# Patient Record
Sex: Male | Born: 1935 | Race: White | Hispanic: No | Marital: Married | Smoking: Former smoker
Health system: Southern US, Community
[De-identification: ages and names within clinical notes are randomized; demographics above are authoritative.]

## PROBLEM LIST (undated history)

## (undated) DIAGNOSIS — D696 Thrombocytopenia, unspecified: Secondary | ICD-10-CM

## (undated) DIAGNOSIS — I350 Nonrheumatic aortic (valve) stenosis: Secondary | ICD-10-CM

## (undated) DIAGNOSIS — E222 Syndrome of inappropriate secretion of antidiuretic hormone: Secondary | ICD-10-CM

## (undated) DIAGNOSIS — S065X9A Traumatic subdural hemorrhage with loss of consciousness of unspecified duration, initial encounter: Secondary | ICD-10-CM

## (undated) DIAGNOSIS — K219 Gastro-esophageal reflux disease without esophagitis: Secondary | ICD-10-CM

## (undated) DIAGNOSIS — E785 Hyperlipidemia, unspecified: Secondary | ICD-10-CM

## (undated) DIAGNOSIS — I10 Essential (primary) hypertension: Secondary | ICD-10-CM

## (undated) DIAGNOSIS — I5022 Chronic systolic (congestive) heart failure: Secondary | ICD-10-CM

## (undated) DIAGNOSIS — I482 Chronic atrial fibrillation, unspecified: Secondary | ICD-10-CM

## (undated) DIAGNOSIS — I272 Pulmonary hypertension, unspecified: Secondary | ICD-10-CM

## (undated) DIAGNOSIS — N400 Enlarged prostate without lower urinary tract symptoms: Secondary | ICD-10-CM

## (undated) DIAGNOSIS — C4490 Unspecified malignant neoplasm of skin, unspecified: Secondary | ICD-10-CM

## (undated) DIAGNOSIS — I2510 Atherosclerotic heart disease of native coronary artery without angina pectoris: Secondary | ICD-10-CM

## (undated) DIAGNOSIS — I639 Cerebral infarction, unspecified: Secondary | ICD-10-CM

## (undated) DIAGNOSIS — M1990 Unspecified osteoarthritis, unspecified site: Secondary | ICD-10-CM

## (undated) DIAGNOSIS — I341 Nonrheumatic mitral (valve) prolapse: Secondary | ICD-10-CM

## (undated) DIAGNOSIS — R3129 Other microscopic hematuria: Secondary | ICD-10-CM

## (undated) DIAGNOSIS — G4733 Obstructive sleep apnea (adult) (pediatric): Secondary | ICD-10-CM

## (undated) HISTORY — DX: Hyperlipidemia, unspecified: E78.5

## (undated) HISTORY — DX: Other microscopic hematuria: R31.29

## (undated) HISTORY — DX: Atherosclerotic heart disease of native coronary artery without angina pectoris: I25.10

## (undated) HISTORY — DX: Benign prostatic hyperplasia without lower urinary tract symptoms: N40.0

## (undated) HISTORY — DX: Thrombocytopenia, unspecified: D69.6

## (undated) HISTORY — DX: Pulmonary hypertension, unspecified: I27.20

## (undated) HISTORY — DX: Obstructive sleep apnea (adult) (pediatric): G47.33

## (undated) HISTORY — PX: SKIN CANCER EXCISION: SHX779

## (undated) HISTORY — DX: Chronic systolic (congestive) heart failure: I50.22

## (undated) HISTORY — DX: Unspecified osteoarthritis, unspecified site: M19.90

## (undated) HISTORY — DX: Nonrheumatic aortic (valve) stenosis: I35.0

## (undated) HISTORY — DX: Nonrheumatic mitral (valve) prolapse: I34.1

## (undated) HISTORY — PX: BACK SURGERY: SHX140

## (undated) HISTORY — DX: Syndrome of inappropriate secretion of antidiuretic hormone: E22.2

---
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
## 1957-11-11 HISTORY — PX: LUMBAR DISC SURGERY: SHX700

## 2005-05-02 ENCOUNTER — Ambulatory Visit: Payer: Self-pay | Admitting: Critical Care Medicine

## 2005-05-08 ENCOUNTER — Ambulatory Visit: Payer: Self-pay | Admitting: Critical Care Medicine

## 2005-05-09 ENCOUNTER — Ambulatory Visit (HOSPITAL_COMMUNITY): Admission: RE | Admit: 2005-05-09 | Discharge: 2005-05-09 | Payer: Self-pay | Admitting: Cardiology

## 2005-07-08 ENCOUNTER — Ambulatory Visit: Payer: Self-pay | Admitting: Critical Care Medicine

## 2005-07-09 ENCOUNTER — Ambulatory Visit: Payer: Self-pay | Admitting: Pulmonary Disease

## 2005-10-08 ENCOUNTER — Inpatient Hospital Stay (HOSPITAL_COMMUNITY): Admission: AD | Admit: 2005-10-08 | Discharge: 2005-10-10 | Payer: Self-pay | Admitting: Cardiology

## 2005-11-22 ENCOUNTER — Ambulatory Visit (HOSPITAL_COMMUNITY): Admission: RE | Admit: 2005-11-22 | Discharge: 2005-11-22 | Payer: Self-pay | Admitting: Cardiology

## 2006-02-24 ENCOUNTER — Emergency Department (HOSPITAL_COMMUNITY): Admission: EM | Admit: 2006-02-24 | Discharge: 2006-02-24 | Payer: Self-pay | Admitting: Emergency Medicine

## 2013-09-16 ENCOUNTER — Encounter: Payer: Self-pay | Admitting: Cardiology

## 2013-10-04 ENCOUNTER — Emergency Department (HOSPITAL_COMMUNITY)
Admission: EM | Admit: 2013-10-04 | Discharge: 2013-10-04 | Disposition: A | Discharge status: Home / Self Care | Payer: Medicare Other | Source: Home / Self Care | Attending: Family Medicine | Admitting: Family Medicine

## 2013-10-04 ENCOUNTER — Encounter (HOSPITAL_COMMUNITY): Payer: Self-pay | Admitting: Emergency Medicine

## 2013-10-04 DIAGNOSIS — IMO0001 Reserved for inherently not codable concepts without codable children: Secondary | ICD-10-CM

## 2013-10-04 DIAGNOSIS — L03019 Cellulitis of unspecified finger: Secondary | ICD-10-CM

## 2013-10-04 HISTORY — DX: Chronic atrial fibrillation, unspecified: I48.20

## 2013-10-04 HISTORY — DX: Essential (primary) hypertension: I10

## 2013-10-04 MED ORDER — DOXYCYCLINE HYCLATE 100 MG PO CAPS: 100.0000 mg | ORAL_CAPSULE | Freq: Two times a day (BID) | ORAL | Status: DC

## 2013-10-04 NOTE — ED Provider Notes (Signed)
CSN: [id number]     Arrival date & time 10/04/13  1242 History   First MD Initiated Contact with Patient 10/04/13 1445     Chief Complaint  Patient presents with  . Cellulitis   (Consider location/radiation/quality/duration/timing/severity/associated sxs/prior Treatment) HPI Comments: Pt with swelling, tenderness proximal to R index fingernail for 3 days, swelling and redness spreading up dorsal finger.   Patient is a 79 y.o. male presenting with abscess. The history is provided by the patient.  Abscess Location:  Hand Hand abscess location:  R fingers Abscess quality: fluctuance, painful and redness   Abscess quality: not draining   Red streaking: no   Duration:  3 days Progression:  Worsening Pain details:    Quality:  Pressure and throbbing   Severity:  Severe   Duration:  3 days   Timing:  Constant   Progression:  Worsening Chronicity:  New Context: not diabetes   Relieved by:  None tried Ineffective treatments:  Topical antibiotics Associated symptoms: no fever     Past Medical History  Diagnosis Date  . Atrial fibrillation, chronic   . Hypertension    History reviewed. No pertinent past surgical history. History reviewed. No pertinent family history. History  Substance Use Topics  . Smoking status: Never Smoker   . Smokeless tobacco: Not on file  . Alcohol Use: No    Review of Systems  Constitutional: Negative for fever and chills.  Musculoskeletal:       Swelling R index finger  Skin: Positive for color change.    Allergies  Review of patient's allergies indicates no known allergies.  Home Medications   Current Outpatient Rx  Name  Route  Sig  Dispense  Refill  . benazepril (LOTENSIN) 10 MG tablet   Oral   Take 10 mg by mouth daily.         Marland Kitchen diltiazem (CARDIZEM) 60 MG tablet   Oral   Take 60 mg by mouth 4 (four) times daily.         . simvastatin (ZOCOR) 10 MG tablet   Oral   Take 10 mg by mouth daily.         Marland Kitchen warfarin  (COUMADIN) 1 MG tablet   Oral   Take 1 mg by mouth daily.         Marland Kitchen doxycycline (VIBRAMYCIN) 100 MG capsule   Oral   Take 1 capsule (100 mg total) by mouth 2 (two) times daily.   20 capsule   0    BP 126/80  Pulse 100  Temp(Src) 98.1 F (36.7 C) (Oral)  Resp 16  SpO2 97% Physical Exam  Constitutional: He appears well-developed and well-nourished. No distress.  Musculoskeletal:       Right hand: He exhibits swelling.       Hands: R finger with erythema to dorsal side, edema of entire finger (ROM impaired by edema).   Skin: Skin is warm and dry.  See msk exam    ED Course  INCISION AND DRAINAGE Date/Time: 10/04/2013 2:25 PM Performed by: Cathlyn Parsons Authorized by: Cathlyn Parsons Consent: Verbal consent obtained. Consent given by: patient Patient understanding: patient states understanding of the procedure being performed Patient identity confirmed: verbally with patient Type: abscess Body area: upper extremity Location details: right index finger Local anesthetic: topical anesthetic Patient sedated: no Scalpel size: 11 Incision type: single straight Complexity: simple Drainage: purulent and bloody Drainage amount: scant Wound treatment: wound left open Packing material: none Comments: Pt on coumadin,  bled for a bit after incision, bleeding stopped with direct pressure. Some blood noted collected under skin   (including critical care time) Labs Review Labs Reviewed  CULTURE, ROUTINE-ABSCESS   Imaging Review No results found.  EKG Interpretation    Date/Time:    Ventricular Rate:    PR Interval:    QRS Duration:   QT Interval:    QTC Calculation:   R Axis:     Text Interpretation:              MDM   1. Paronychia of second finger, right   rx doxycycline 100mg  BID #20. Pt to f/u with pcp in 2 days to have infection checked, and to discuss with pcp when to have INR checked (because of doxy use).      Cathlyn Parsons, NP 10/04/13  1558

## 2013-10-04 NOTE — ED Notes (Signed)
Call back number for lab issues verified at release  

## 2013-10-04 NOTE — ED Provider Notes (Signed)
Medical screening examination/treatment/procedure(s) were performed by a resident physician or non-physician practitioner and as the supervising physician I was immediately available for consultation/collaboration.  Polk Minor, MD      Jacoba Cherney S Zevin Nevares, MD 10/04/13 2114 

## 2013-10-04 NOTE — ED Notes (Signed)
Pain, swelling right indx finger x 3 days w red streak into  Hand, decreased ROM

## 2013-10-07 LAB — CULTURE, ROUTINE-ABSCESS

## 2013-10-13 NOTE — ED Notes (Signed)
Abscess culture R index finger: Few Staph Aureus.  Pt. adequately treated with I and D and Doxycycline. Vassie Moselle 10/13/2013

## 2013-11-08 ENCOUNTER — Encounter: Payer: Self-pay | Admitting: General Surgery

## 2013-11-08 DIAGNOSIS — I4821 Permanent atrial fibrillation: Secondary | ICD-10-CM | POA: Insufficient documentation

## 2013-11-08 DIAGNOSIS — I119 Hypertensive heart disease without heart failure: Secondary | ICD-10-CM

## 2013-11-08 DIAGNOSIS — I340 Nonrheumatic mitral (valve) insufficiency: Secondary | ICD-10-CM

## 2013-11-08 DIAGNOSIS — I4891 Unspecified atrial fibrillation: Secondary | ICD-10-CM

## 2013-11-08 DIAGNOSIS — I10 Essential (primary) hypertension: Secondary | ICD-10-CM | POA: Insufficient documentation

## 2013-11-22 ENCOUNTER — Ambulatory Visit: Payer: Self-pay | Admitting: Cardiology

## 2013-11-23 ENCOUNTER — Encounter (INDEPENDENT_AMBULATORY_CARE_PROVIDER_SITE_OTHER): Payer: Self-pay

## 2013-11-23 ENCOUNTER — Ambulatory Visit (INDEPENDENT_AMBULATORY_CARE_PROVIDER_SITE_OTHER): Payer: Medicare Other | Admitting: Cardiology

## 2013-11-23 ENCOUNTER — Other Ambulatory Visit (HOSPITAL_COMMUNITY): Payer: Self-pay | Admitting: Cardiology

## 2013-11-23 ENCOUNTER — Encounter: Payer: Self-pay | Admitting: Cardiology

## 2013-11-23 ENCOUNTER — Encounter: Payer: Self-pay | Admitting: Cardiovascular Disease

## 2013-11-23 ENCOUNTER — Other Ambulatory Visit: Payer: Self-pay | Admitting: General Surgery

## 2013-11-23 ENCOUNTER — Ambulatory Visit (HOSPITAL_COMMUNITY): Payer: Medicare Other | Attending: Cardiovascular Disease | Admitting: Cardiology

## 2013-11-23 VITALS — BP 140/90 | HR 87 | Ht 70.0 in | Wt 200.8 lb

## 2013-11-23 DIAGNOSIS — I10 Essential (primary) hypertension: Secondary | ICD-10-CM

## 2013-11-23 DIAGNOSIS — I059 Rheumatic mitral valve disease, unspecified: Secondary | ICD-10-CM

## 2013-11-23 DIAGNOSIS — I340 Nonrheumatic mitral (valve) insufficiency: Secondary | ICD-10-CM

## 2013-11-23 DIAGNOSIS — I341 Nonrheumatic mitral (valve) prolapse: Secondary | ICD-10-CM | POA: Insufficient documentation

## 2013-11-23 DIAGNOSIS — I4891 Unspecified atrial fibrillation: Secondary | ICD-10-CM | POA: Insufficient documentation

## 2013-11-23 DIAGNOSIS — I079 Rheumatic tricuspid valve disease, unspecified: Secondary | ICD-10-CM | POA: Insufficient documentation

## 2013-11-23 DIAGNOSIS — I482 Chronic atrial fibrillation, unspecified: Secondary | ICD-10-CM

## 2013-11-23 DIAGNOSIS — E785 Hyperlipidemia, unspecified: Secondary | ICD-10-CM | POA: Insufficient documentation

## 2013-11-23 NOTE — Progress Notes (Signed)
[name], [name]  [address] Phone: [phone] Fax:  [phone]  Date:  11/23/2013   ID:  Ryan Savage, DOB 02-07-1929, MRN [id number]  PCP:  Mathews Argyle, MD  Cardiologist:  Fransico Him, MD     History of Present Illness: Ryan Savage is a 80 y.o. 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  He is doing well.  He denies any chest pain, SOB, DOE, LE edema, dizziness, palpitations or syncope.   Wt Readings from Last 3 Encounters:  11/08/13 204 lb 6.4 oz (92.715 kg)     Past Medical History  Diagnosis Date  . Hypertension   . Atrial fibrillation, chronic     Chronic..coumadin clinic -Dr Radford Pax  . BPH (benign prostatic hypertrophy)   . [name] (degenerative joint disease)   . Hyperlipidemia     LDL goal less then 100  . OSA (obstructive sleep apnea)     intolerant with CPAP  . Microscopic hematuria     [name]  . Thrombocytopenia     Mild- platelet count 143,000 on 02/2011, stable 08/2011  . Mitral valve prolapse     with moderate MR  . SIADH (syndrome of inappropriate ADH production)     Current Outpatient Prescriptions  Medication Sig Dispense Refill  . benazepril (LOTENSIN) 10 MG tablet Take 40 mg by mouth daily.       Marland Kitchen diltiazem (CARDIZEM CD) 240 MG 24 hr capsule       . finasteride (PROSCAR) 5 MG tablet Take 5 mg by mouth daily.       Marland Kitchen loratadine (CLARITIN REDITABS) 10 MG dissolvable tablet Take 10 mg by mouth daily.      . simvastatin (ZOCOR) 10 MG tablet Take 10 mg by mouth daily.      Marland Kitchen warfarin (COUMADIN) 1 MG tablet Take 1 mg by mouth daily.       No current facility-administered medications for this visit.    Allergies:    Allergies  Allergen Reactions  . Novocain [Procaine]     Social History:  The patient  reports that he has never smoked. He does not have any smokeless tobacco history on file. He reports that  he does not drink alcohol or use illicit drugs.   Family History:  The patient's family history is not on file.   ROS:  Please see the history of present illness.      All other systems reviewed and negative.   PHYSICAL EXAM: VS:  There were no vitals taken for this visit. Well nourished, well developed, in no acute distress HEENT: normal Neck: no JVD Cardiac:  normal S1, S2; RRR; no murmur Lungs:  clear to auscultation bilaterally, no wheezing, rhonchi or rales Abd: soft, nontender, no hepatomegaly Ext: no edema Skin: warm and dry Neuro:  CNs 2-12 intact, no focal abnormalities noted  EKG:     Chronic atrial fibrillation rate controlled at 87bpm with nonspecific ST/T wave abnormality  ASSESSMENT AND PLAN:  1. Chronic atrial fibrillation - rate controlled  - continue Diltiazem CD/Warfarin 2. Chronic anticoagulation 3. HTN - mildly elevated diastolic BP  - His BP readings he brought from home showed 138/75-150/68mmHg.  - continue Diltiazem CD/Benazepril 4. MVP of the posterior MV leaflet and moderate MR  - He had an echo done today and results are pending  Followup with me in 1 year  Signed, Fransico Him,  MD 11/23/2013 8:54 AM

## 2013-11-23 NOTE — Progress Notes (Signed)
Echo performed. 

## 2013-11-23 NOTE — Patient Instructions (Signed)
Your physician recommends that you continue on your current medications as directed. Please refer to the Current Medication list given to you today.  Your physician wants you to follow-up in: 12 Months with Dr Mallie Snooks will receive a reminder letter in the mail two months in advance. If you don't receive a letter, please call our office to schedule the follow-up appointment.

## 2013-11-24 ENCOUNTER — Telehealth: Payer: Self-pay | Admitting: General Surgery

## 2013-11-24 DIAGNOSIS — I341 Nonrheumatic mitral (valve) prolapse: Secondary | ICD-10-CM

## 2013-11-24 NOTE — Telephone Encounter (Signed)
Message copied by Lily Kocher on Wed Nov 24, 2013  1:37 PM ------      Message from: Fransico Him R      Created: Wed Nov 24, 2013  9:30 AM       Please let patient know that echo showed normal LVF with Ef 65-70% with mildly thickened heart muscle, posterior MVP with moderate MR and moderately enlarged LA/RA with no pulmonary HTN - repeat echo in 1 year ------

## 2013-11-25 ENCOUNTER — Ambulatory Visit: Payer: Self-pay | Admitting: Cardiology

## 2014-01-07 ENCOUNTER — Other Ambulatory Visit: Payer: Self-pay

## 2014-01-07 MED ORDER — BENAZEPRIL HCL 10 MG PO TABS: 40.0000 mg | ORAL_TABLET | Freq: Every day | ORAL | Status: DC

## 2014-01-10 ENCOUNTER — Other Ambulatory Visit: Payer: Self-pay | Admitting: *Deleted

## 2014-01-10 MED ORDER — BENAZEPRIL HCL 40 MG PO TABS: 40.0000 mg | ORAL_TABLET | Freq: Every day | ORAL | Status: DC

## 2014-01-10 NOTE — Telephone Encounter (Signed)
Patients wife stated that the patient needs benazepril 40mg  tablet take 1qd. I will send in a correct rx.

## 2014-07-20 ENCOUNTER — Encounter: Payer: Self-pay | Admitting: *Deleted

## 2014-12-06 ENCOUNTER — Encounter: Payer: Self-pay | Admitting: Cardiology

## 2014-12-06 ENCOUNTER — Ambulatory Visit (INDEPENDENT_AMBULATORY_CARE_PROVIDER_SITE_OTHER): Payer: Medicare Other | Admitting: Cardiology

## 2014-12-06 VITALS — BP 134/74 | HR 96 | Ht 70.0 in | Wt 198.0 lb

## 2014-12-06 DIAGNOSIS — I482 Chronic atrial fibrillation, unspecified: Secondary | ICD-10-CM

## 2014-12-06 DIAGNOSIS — I10 Essential (primary) hypertension: Secondary | ICD-10-CM

## 2014-12-06 DIAGNOSIS — I340 Nonrheumatic mitral (valve) insufficiency: Secondary | ICD-10-CM

## 2014-12-06 DIAGNOSIS — I341 Nonrheumatic mitral (valve) prolapse: Secondary | ICD-10-CM

## 2014-12-06 NOTE — Patient Instructions (Signed)
Your physician has requested that you have an echocardiogram. Echocardiography is a painless test that uses sound waves to create images of your heart. It provides your doctor with information about the size and shape of your heart and how well your heart's chambers and valves are working. This procedure takes approximately one hour. There are no restrictions for this procedure.  Your physician has recommended that you wear a 24-hour holter monitor. Holter monitors are medical devices that record the heart's electrical activity. Doctors most often use these monitors to diagnose arrhythmias. Arrhythmias are problems with the speed or rhythm of the heartbeat. The monitor is a small, portable device. You can wear one while you do your normal daily activities. This is usually used to diagnose what is causing palpitations/syncope (passing out).  Your physician recommends that you continue on your current medications as directed. Please refer to the Current Medication list given to you today.  Your physician wants you to follow-up in: 1 year with [name]. You will receive a reminder letter in the mail two months in advance. If you don't receive a letter, please call our office to schedule the follow-up appointment.

## 2014-12-06 NOTE — Addendum Note (Signed)
Addended by: Fransico Him R on: 12/06/2014 02:32 PM   Modules accepted: Miquel Dunn

## 2014-12-06 NOTE — Progress Notes (Signed)
Cardiology Office Note   Date:  12/06/2014   ID:  Ryan Savage, DOB November 07, 1929, MRN [id number]  PCP:  Mathews Argyle, MD  Cardiologist:   Sueanne Margarita, MD   Chief Complaint  Patient presents with  . Mitral Valve Prolapse  . Mitral Regurgitation  . Hypertension  . Atrial Fibrillation      History of Present Illness: Ryan Savage is a 81 y.o. 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 He is doing well. He denies any chest pain, SOB, DOE, LE edema, dizziness, palpitations or syncope    Past Medical History  Diagnosis Date  . Hypertension   . Atrial fibrillation, chronic     Chronic..coumadin clinic -Dr Radford Pax  . BPH (benign prostatic hypertrophy)   . [name] (degenerative joint disease)   . Hyperlipidemia     LDL goal less then 100  . OSA (obstructive sleep apnea)     intolerant with CPAP  . Microscopic hematuria     [name]  . Thrombocytopenia     Mild- platelet count 143,000 on 02/2011, stable 08/2011  . Mitral valve prolapse     with moderate MR  . SIADH (syndrome of inappropriate ADH production)     Past Surgical History  Procedure Laterality Date  . Appendectomy  1950  . Ruptured disc repair  1959     Current Outpatient Prescriptions  Medication Sig Dispense Refill  . benazepril (LOTENSIN) 40 MG tablet Take 1 tablet (40 mg total) by mouth daily. 90 tablet 3  . diltiazem (CARDIZEM CD) 240 MG 24 hr capsule Take 240 mg by mouth daily.     . finasteride (PROSCAR) 5 MG tablet Take 5 mg by mouth daily.     Marland Kitchen loratadine (CLARITIN REDITABS) 10 MG dissolvable tablet Take 10 mg by mouth daily.    . simvastatin (ZOCOR) 10 MG tablet Take 10 mg by mouth daily.    Marland Kitchen warfarin (COUMADIN) 1 MG tablet Take 1 mg by mouth daily.     No current facility-administered medications for this visit.    Allergies:   Novocain    Social History:  The patient   reports that he has never smoked. He does not have any smokeless tobacco history on file. He reports that he does not drink alcohol or use illicit drugs.   Family History:  The patient's family history includes [name] in his brother and father; Colon cancer in his brother and sister; Heart attack in his brother and father; Heart failure in his mother.    ROS:  Please see the history of present illness.   Otherwise, review of systems are positive for none.   All other systems are reviewed and negative.    PHYSICAL EXAM: VS:  BP 134/74 mmHg  Pulse 96  Ht 5\' 10"  (1.778 m)  Wt 198 lb (89.812 kg)  BMI 28.41 kg/m2 , BMI Body mass index is 28.41 kg/(m^2). GEN: Well nourished, well developed, in no acute distress HEENT: normal Neck: no JVD, carotid bruits, or masses Cardiac: irregularly irregular; no rubs, or gallops,no edema 2/6 mid to late systolic murmur at apex, trace LE edema Respiratory:  clear to auscultation bilaterally, normal work of breathing GI: soft, nontender, nondistended, + BS MS: no deformity or atrophy Skin: warm and dry, no rash Neuro:  Strength and sensation are intact Psych: euthymic mood, full affect   EKG:  EKG is  ordered today and showed  atrial fibrillation at 96bpm with no ST changes    Recent Labs: No results found for requested labs within last 365 days.    Lipid Panel No results found for: CHOL, TRIG, HDL, CHOLHDL, VLDL, LDLCALC, LDLDIRECT    Wt Readings from Last 3 Encounters:  12/06/14 198 lb (89.812 kg)  11/23/13 200 lb 12.8 oz (91.082 kg)  11/08/13 204 lb 6.4 oz (92.715 kg)      Other studies Reviewed: Additional studies/ records that were reviewed today include: 2D echo 11/2013 Review of the above records demonstrates: normal LVF with moderate MR  ASSESSMENT AND PLAN:  1. Chronic atrial fibrillation - rate fairly well controlled but on the high side of normal at rest  - I will get a 24 hour Holter monitor to assess average HR to see if we  need to adjust meds - continue Diltiazem CD/Warfarin 2. Chronic anticoagulation 3. HTN - controlled - continue Diltiazem CD/Benazepril 4. MVP of the posterior MV leaflet and moderate MR - Repeat echo   Current medicines are reviewed at length with the patient today.  The patient does not have concerns regarding medicines.  The following changes have been made:  no change  Labs/ tests ordered today include: 2D echo and 24 Hour Holter    Disposition:   FU with me in 1 year   Signed, Sueanne Margarita, MD  12/06/2014 9:38 AM    [facility] [place], [place], [place]  [address] Phone: [phone]; Fax: [phone]

## 2014-12-13 ENCOUNTER — Other Ambulatory Visit: Payer: Self-pay | Admitting: Cardiology

## 2014-12-14 ENCOUNTER — Telehealth: Payer: Self-pay

## 2014-12-14 ENCOUNTER — Encounter: Payer: Self-pay | Admitting: *Deleted

## 2014-12-14 ENCOUNTER — Ambulatory Visit (HOSPITAL_COMMUNITY): Payer: Medicare Other | Attending: Internal Medicine | Admitting: Cardiology

## 2014-12-14 ENCOUNTER — Encounter (INDEPENDENT_AMBULATORY_CARE_PROVIDER_SITE_OTHER): Payer: Medicare Other

## 2014-12-14 DIAGNOSIS — I482 Chronic atrial fibrillation, unspecified: Secondary | ICD-10-CM

## 2014-12-14 DIAGNOSIS — I341 Nonrheumatic mitral (valve) prolapse: Secondary | ICD-10-CM | POA: Insufficient documentation

## 2014-12-14 DIAGNOSIS — I340 Nonrheumatic mitral (valve) insufficiency: Secondary | ICD-10-CM | POA: Insufficient documentation

## 2014-12-14 NOTE — Progress Notes (Signed)
Patient ID: Ryan Savage, male   DOB: Jun 04, 1929, 81 y.o.   MRN: [id number] [facility] 24 hour holter monitor applied to patient.

## 2014-12-14 NOTE — Telephone Encounter (Signed)
BP good and HR controlled - no change in meds

## 2014-12-14 NOTE — Progress Notes (Signed)
Echo performed. 

## 2014-12-14 NOTE — Telephone Encounter (Addendum)
Patient in the office to have holter monitor placed.   He dropped off VS log for Dr. Theodosia Blender review: BP 126/76 HR 79 BP 134/78 HR 84 (no AF) BP 136/80 HR 84 (AF) BP 125/76 HR 91 (AF in the morning) BP 132/73 HR 87 (no AF) BP 126/75 HR 90 (AF in the morning) BP 126/73 HR 79 (AF) BP 129/81 HR 68 (no AF) BP 129/66 HR 83 (AF) BP 132/84 HR 85 (no AF) BP 133/76 HR 92 (no AF) BP 135/75 HR 89 (AF) BP 125/77 HR 77 (no AF) BP 126/82 HR 72 (no AF)  Readings taken from Jan. 30 to Feb. 2. No times provided.   To [name] for review.

## 2014-12-15 NOTE — Telephone Encounter (Signed)
Follow Up ° ° ° ° ° ° ° °Pt returning phone call °

## 2014-12-15 NOTE — Telephone Encounter (Signed)
F/u ° ° °Pt returning your call °

## 2014-12-15 NOTE — Telephone Encounter (Signed)
Instructed patient to continue current treatment regimen due to good blood pressure readings. Patient informed of ECHO results and verbal understanding expressed.  Repeat ECHO ordered to be scheduled in one year. Patient agrees with treatment plan.

## 2014-12-15 NOTE — Telephone Encounter (Signed)
After patient called twice and walked into office today, left message for patient that he would be called this afternoon after clinic.

## 2014-12-15 NOTE — Telephone Encounter (Signed)
Follow Up       Pt returning phone call from [facility] Please call back and advise.

## 2014-12-15 NOTE — Telephone Encounter (Signed)
-----   Message from Sueanne Margarita, MD sent at 12/14/2014  1:49 PM EST ----- Please let patient know that echo showed normal LVF with MVP of the posterior mitral valve leaflet and mild to moderate MR. REpeat echo in 1 year for MR

## 2014-12-16 NOTE — Telephone Encounter (Signed)
This encounter was created in error - please disregard.

## 2014-12-22 ENCOUNTER — Telehealth: Payer: Self-pay | Admitting: Cardiology

## 2014-12-22 NOTE — Telephone Encounter (Signed)
Please let patient know that heart monitor showed atrial fibrillation with average HR of 81bpm.  There were occasional multifocal PVC's and

## 2014-12-27 NOTE — Telephone Encounter (Signed)
Patient informed of monitor results and verbal understanding expressed.   

## 2015-05-29 ENCOUNTER — Other Ambulatory Visit: Payer: Self-pay | Admitting: Cardiology

## 2015-06-05 ENCOUNTER — Other Ambulatory Visit: Payer: Self-pay | Admitting: Cardiology

## 2015-08-30 ENCOUNTER — Other Ambulatory Visit: Payer: Self-pay | Admitting: Cardiology

## 2016-01-04 ENCOUNTER — Other Ambulatory Visit: Payer: Self-pay | Admitting: Cardiology

## 2016-01-04 ENCOUNTER — Other Ambulatory Visit: Payer: Self-pay | Admitting: *Deleted

## 2016-01-04 MED ORDER — BENAZEPRIL HCL 40 MG PO TABS: 40.0000 mg | ORAL_TABLET | Freq: Every day | ORAL | Status: DC

## 2016-03-25 ENCOUNTER — Other Ambulatory Visit: Payer: Self-pay | Admitting: *Deleted

## 2016-03-25 MED ORDER — BENAZEPRIL HCL 40 MG PO TABS
40.0000 mg | ORAL_TABLET | Freq: Every day | ORAL | Status: DC
Start: 2016-03-25 — End: 2016-09-15

## 2016-06-13 ENCOUNTER — Other Ambulatory Visit: Payer: Self-pay | Admitting: Cardiology

## 2016-09-15 ENCOUNTER — Other Ambulatory Visit: Payer: Self-pay | Admitting: Cardiology

## 2016-10-09 ENCOUNTER — Ambulatory Visit: Payer: Medicare Other | Admitting: Cardiology

## 2016-10-17 ENCOUNTER — Ambulatory Visit (INDEPENDENT_AMBULATORY_CARE_PROVIDER_SITE_OTHER): Payer: Medicare Other | Admitting: Cardiology

## 2016-10-17 ENCOUNTER — Encounter (INDEPENDENT_AMBULATORY_CARE_PROVIDER_SITE_OTHER): Payer: Self-pay

## 2016-10-17 ENCOUNTER — Encounter: Payer: Self-pay | Admitting: Cardiology

## 2016-10-17 VITALS — BP 140/84 | HR 88 | Ht 70.0 in | Wt 195.1 lb

## 2016-10-17 DIAGNOSIS — I341 Nonrheumatic mitral (valve) prolapse: Secondary | ICD-10-CM

## 2016-10-17 DIAGNOSIS — I482 Chronic atrial fibrillation: Secondary | ICD-10-CM | POA: Diagnosis not present

## 2016-10-17 DIAGNOSIS — I10 Essential (primary) hypertension: Secondary | ICD-10-CM

## 2016-10-17 DIAGNOSIS — I4821 Permanent atrial fibrillation: Secondary | ICD-10-CM

## 2016-10-17 MED ORDER — DILTIAZEM HCL ER COATED BEADS 240 MG PO CP24: 240.0000 mg | ORAL_CAPSULE | Freq: Every day | ORAL | 3 refills | Status: DC

## 2016-10-17 NOTE — Progress Notes (Signed)
Cardiology Office Note    Date:  10/17/2016   ID:  Ryan Savage, DOB October 06, 1929, MRN [id number]  PCP:  Mathews Argyle, MD  Cardiologist:  Fransico Him, MD   Chief Complaint  Patient presents with  . Atrial Fibrillation  . Hypertension  . Mitral Regurgitation    History of Present Illness:  Ryan Savage is a 82 y.o. 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 He is doing well. He denies any chest pain, SOB, DOE, LE edema, dizziness, palpitations or syncope    Past Medical History:  Diagnosis Date  . Atrial fibrillation, chronic (HCC)    Chronic..coumadin clinic -Dr Radford Pax  . BPH (benign prostatic hypertrophy)   . [name] (degenerative joint disease)   . Hyperlipidemia    LDL goal less then 100  . Hypertension   . Microscopic hematuria    [name]  . Mitral valve prolapse    with moderate MR  . OSA (obstructive sleep apnea)    intolerant with CPAP  . SIADH (syndrome of inappropriate ADH production) ([place])   . Thrombocytopenia (HCC)    Mild- platelet count 143,000 on 02/2011, stable 08/2011    Past Surgical History:  Procedure Laterality Date  . APPENDECTOMY  1950  . ruptured disc repair  1959    Current Medications: Outpatient Medications Prior to Visit  Medication Sig Dispense Refill  . benazepril (LOTENSIN) 40 MG tablet TAKE 1 TABLET BY MOUTH  DAILY 30 tablet 3  . diltiazem (CARDIZEM CD) 240 MG 24 hr capsule Take 240 mg by mouth daily.     . finasteride (PROSCAR) 5 MG tablet Take 5 mg by mouth daily.     . simvastatin (ZOCOR) 10 MG tablet Take 10 mg by mouth daily.    Marland Kitchen warfarin (COUMADIN) 1 MG tablet Take 1 mg by mouth daily.    Marland Kitchen loratadine (CLARITIN REDITABS) 10 MG dissolvable tablet Take 10 mg by mouth daily.     No facility-administered medications prior to visit.      Allergies:   Novocain [procaine] and Other   Social History   Social  History  . Marital status: Married    Spouse name: N/A  . Number of children: N/A  . Years of education: N/A   Social History Main Topics  . Smoking status: Never Smoker  . Smokeless tobacco: Never Used  . Alcohol use No  . Drug use: No  . Sexual activity: Not Asked   Other Topics Concern  . None   Social History Narrative  . None     Family History:  The patient's family history includes [name] in his brother and father; Colon cancer in his brother and sister; Heart attack in his brother and father; Heart failure in his mother.   ROS:   Please see the history of present illness.    ROS All other systems reviewed and are negative.  No flowsheet data found.     PHYSICAL EXAM:   VS:  BP 140/84   Pulse 88   Ht 5\' 10"  (1.778 m)   Wt 195 lb 1.9 oz (88.5 kg)   SpO2 98%   BMI 28.00 kg/m    GEN: Well nourished, well developed, in no acute distress  HEENT: normal  Neck: no JVD, carotid bruits, or masses Cardiac: RRR; no rubs, or gallops,no edema.  Intact distal pulses bilaterally. 2/6 late mid to late systolic murmur at apex  with mid systolic click Respiratory:  clear to auscultation bilaterally, normal work of breathing GI: soft, nontender, nondistended, + BS MS: no deformity or atrophy  Skin: warm and dry, no rash Neuro:  Alert and Oriented x 3, Strength and sensation are intact Psych: euthymic mood, full affect  Wt Readings from Last 3 Encounters:  10/17/16 195 lb 1.9 oz (88.5 kg)  12/06/14 198 lb (89.8 kg)  11/23/13 200 lb 12.8 oz (91.1 kg)      Studies/Labs Reviewed:   EKG:  EKG is ordered today.  The ekg ordered today demonstrates atrial fibrillation with HR 88bpm with no ST changes  Recent Labs: No results found for requested labs within last 8760 hours.   Lipid Panel No results found for: CHOL, TRIG, HDL, CHOLHDL, VLDL, LDLCALC, LDLDIRECT  Additional studies/ records that were reviewed today include:  none    ASSESSMENT:    1. Permanent atrial  fibrillation ([place])   2. Benign essential HTN   3. Mitral valve prolapse      PLAN:  In order of problems listed above:  1. Permanent atrial fibrillation rate controlled. He will continue on cardizem and warfarin.  2. HTN - BP controlled on current meds.  He will continue on ACE I and Cardizem.  3. MVP with mild to moderate MR by echo.  I will repeat echo to assess for progression of MR.    Medication Adjustments/Labs and Tests Ordered: Current medicines are reviewed at length with the patient today.  Concerns regarding medicines are outlined above.  Medication changes, Labs and Tests ordered today are listed in the Patient Instructions below.  There are no Patient Instructions on file for this visit.   Signed, Fransico Him, MD  10/17/2016 10:08 AM    [facility] [place], [place],   [address] Phone: [phone]; Fax: [phone]

## 2016-10-17 NOTE — Patient Instructions (Signed)

## 2016-11-08 ENCOUNTER — Ambulatory Visit (HOSPITAL_COMMUNITY): Payer: Medicare Other | Attending: Cardiovascular Disease

## 2016-11-08 ENCOUNTER — Other Ambulatory Visit: Payer: Self-pay

## 2016-11-08 ENCOUNTER — Encounter (INDEPENDENT_AMBULATORY_CARE_PROVIDER_SITE_OTHER): Payer: Self-pay

## 2016-11-08 DIAGNOSIS — I340 Nonrheumatic mitral (valve) insufficiency: Secondary | ICD-10-CM | POA: Diagnosis not present

## 2016-11-08 DIAGNOSIS — I341 Nonrheumatic mitral (valve) prolapse: Secondary | ICD-10-CM | POA: Diagnosis not present

## 2016-11-08 DIAGNOSIS — I071 Rheumatic tricuspid insufficiency: Secondary | ICD-10-CM | POA: Insufficient documentation

## 2016-11-21 ENCOUNTER — Encounter: Payer: Self-pay | Admitting: Cardiology

## 2016-11-21 ENCOUNTER — Ambulatory Visit (INDEPENDENT_AMBULATORY_CARE_PROVIDER_SITE_OTHER): Payer: Medicare Other | Admitting: Cardiology

## 2016-11-21 ENCOUNTER — Encounter (INDEPENDENT_AMBULATORY_CARE_PROVIDER_SITE_OTHER): Payer: Self-pay

## 2016-11-21 VITALS — BP 140/80 | HR 80 | Ht 70.0 in | Wt 201.0 lb

## 2016-11-21 DIAGNOSIS — I340 Nonrheumatic mitral (valve) insufficiency: Secondary | ICD-10-CM

## 2016-11-21 NOTE — Progress Notes (Signed)
11/21/2016 Ryan Savage   02/12/1929  [id number]  Primary Physician Mathews Argyle, MD Primary Cardiologist: [name]   Reason for Visit/CC: Mitral Regurgitation.   HPI:  [name] is a 83 y.o. male with a history of posterior MV leaflet prolapse with with moderate MR, HTN and chronic atrial fibrillation on chronic anticoagulation.  He was recently seen by [name] 10/17/16 for routien f/u. She ordered a repeat 2D echo to reassess for progression of his MR. Echo showed normal LVF with mild MVP and moderate MR, severe LAE and RAE and TV prolapse with mild to moderate TR and moderate pulmonary HTN - compared to echo a year ago there is now at least moderate MR and now he has moderate pulmonary HTN which is new. [name] has recommended a TEE to better assess. He now presents to clinic to discuss results and need for additional imaging studies.   Given his age of 73, he is hesitant regarding further w/u as he feels that he may not ever want surgery. He is not willing to make a decision today regarding scheduling a TEE at the moment. He wishes to further discus and consider this with his wife.   He is asymptomatic. He is very active despite his age. He is enrolled in the silver sne[employer] club and exercises regularly, w/o exertional CP or dyspnea. He denies any s/s of HF, including no orthopnea, PND or LEE. He is asymptomatic with his atrial fibrillation.    Current Meds  Medication Sig  . Acetaminophen (TYLENOL) 325 MG CAPS Take by mouth as directed.  . benazepril (LOTENSIN) 40 MG tablet TAKE 1 TABLET BY MOUTH  DAILY  . diltiazem (CARDIZEM CD) 240 MG 24 hr capsule Take 1 capsule (240 mg total) by mouth daily.  . finasteride (PROSCAR) 5 MG tablet Take 5 mg by mouth daily.   . Multiple Vitamin (MULTIVITAMIN) capsule Take by mouth.  . simvastatin (ZOCOR) 10 MG tablet Take 10 mg by mouth daily.  Marland Kitchen warfarin (COUMADIN) 1 MG tablet Take 1 mg by mouth daily.   Allergies    Allergen Reactions  . Novocain [Procaine]     UNKNOWN  . Other Rash    Novacaine   Past Medical History:  Diagnosis Date  . Atrial fibrillation, chronic (HCC)    Chronic..coumadin clinic -Dr Radford Pax  . BPH (benign prostatic hypertrophy)   . [name] (degenerative joint disease)   . Hyperlipidemia    LDL goal less then 100  . Hypertension   . Microscopic hematuria    [name]  . Mitral valve prolapse    with moderate MR  . OSA (obstructive sleep apnea)    intolerant with CPAP  . SIADH (syndrome of inappropriate ADH production) ([place])   . Thrombocytopenia (HCC)    Mild- platelet count 143,000 on 02/2011, stable 08/2011   Family History  Problem Relation Age of Onset  . Heart failure Mother   . Heart attack Father   . [name] Father   . Heart attack Brother   . [name] Brother   . Colon cancer Brother   . Colon cancer Sister    Past Surgical History:  Procedure Laterality Date  . APPENDECTOMY  1950  . ruptured disc repair  1959   Social History   Social History  . Marital status: Married    Spouse name: N/A  . Number of children: N/A  . Years of education: N/A   Occupational History  . Not on file.  Social History Main Topics  . Smoking status: Never Smoker  . Smokeless tobacco: Never Used  . Alcohol use No  . Drug use: No  . Sexual activity: Not on file   Other Topics Concern  . Not on file   Social History Narrative  . No narrative on file     Review of Systems: General: negative for chills, fever, night sweats or weight changes.  Cardiovascular: negative for chest pain, dyspnea on exertion, edema, orthopnea, palpitations, paroxysmal nocturnal dyspnea or shortness of breath Dermatological: negative for rash Respiratory: negative for cough or wheezing Urologic: negative for hematuria Abdominal: negative for nausea, vomiting, diarrhea, bright red blood per rectum, melena, or hematemesis Neurologic: negative for visual changes, syncope, or dizziness All  other systems reviewed and are otherwise negative except as noted above.   Physical Exam:  Blood pressure 140/80, pulse 80, height 5\' 10"  (1.778 m), weight 201 lb (91.2 kg).  General appearance: alert, cooperative and no distress Neck: no carotid bruit and no JVD Lungs: clear to auscultation bilaterally Heart: irregularly irregular rhythm and 2/6 murmur, loudest near apex Extremities: extremities normal, atraumatic, no cyanosis or edema Pulses: 2+ and symmetric Skin: Skin color, texture, turgor normal. No rashes or lesions Neurologic: Grossly normal  EKG not performed   ASSESSMENT AND PLAN:   1. Mitral Regurgitation: Recent Echo 11/08/16 showed normal LVF with mild MVP and moderate MR, severe LAE and RAE and TV prolapse with mild to moderate TR and moderate pulmonary HTN - compared to echo a year ago there is now at least moderate MR and now he has moderate pulmonary HTN which is new. [name] has recommended a TEE to better assess. I discussed with patient indication for procedure as well as potential risk. Given his age of 25, he is hesitant regarding further w/u as he feels that he may not ever want surgery. He is not willing to make a decision today regarding scheduling a TEE at the moment. He wishes to further discus and consider this with his wife. I informed patient of the risk of development of irreversible systolic HF if worsening mitral valve disease w/o surgical treatment. He was given patient education materials regarding the  procedure and states he will call later next week once he reaches a decision. He was informed that if he agrees, the procedure will need to be performed within 30 days of today's office visit.  I will notify [name]. At this point, he remains asymptomatic.  He is very active despite his age. He is enrolled in the [employer] sne[employer] club and exercises regularly, w/o exertional CP or dyspnea. He denies any s/s of HF, including no orthopnea, PND or LEE.   2.  Chronic Atrial Fibrillation: rate is controlled. He is on chronic anticoagulation. He is asymptomatic.     Lyda Jester PA-C 11/21/2016 5:47 PM

## 2016-11-21 NOTE — Patient Instructions (Addendum)
Medication Instructions:  Your physician recommends that you continue on your current medications as directed. Please refer to the Current Medication list given to you today.   Labwork: NONE  Testing/Procedures: NONE  Follow-Up: PLEASE CALL THE OFFICE [phone] ASAP TO LET us KNOW IF YOU WOULD LIKE TO PROCEED WITH THE TEE AS EXPLAINED TO YOU TODAY BY BRITTANY SIMMONS, PAC.   [name] WILL BE IN [place] ON 11/27/16 TO DO YOUR PROCEDURE IF YOU DECIDE TO GO THROUGH WITH THE TEE.   Any Other Special Instructions Will Be Listed Below (If Applicable).     If you need a refill on your cardiac medications before your next appointment, please call your pharmacy.            Mitral Valve Regurgitation Mitral valve regurgitation, also called mitral regurgitation, is a condition in which blood leaks from the mitral valve in the heart. The mitral valve is located between the upper left chamber (left atrium) and the lower left chamber (left ventricle) of the heart. Normally, this valve opens when the atrium pumps blood into the ventricle, and it closes when the ventricle pumps blood out to the body. Mitral valve regurgitation happens when the mitral valve does not close properly. As a result, blood in the ventricle leaks back into the atrium. Mitral valve regurgitation causes the heart to work harder to pump blood. If the condition is mild, a person may not have symptoms. However, over time, this can lead to heart failure. What are the causes? This condition may be caused by:  A condition in which the mitral valves do not close completely when the heart pumps blood (mitral valve prolapse).  Infection, such as endocarditis or rheumatic fever.  Damage to the mitral valve, such as from injury (trauma) to the heart, a problem present at birth (birth defect), or a heart attack.  Certain medicines. What increases the risk? This condition is more likely to develop in people who  have:  Certain forms of heart disease.  A family history of heart valve disease.  Certain conditions that are present at birth (congenital). You are also more likely to develop this condition if you have taken certain diet pills in the past. What are the signs or symptoms? Symptoms of this condition include:  Shortness of breath with physical activity, like climbing stairs.  Fast or irregular heartbeat.  Cough.  Suddenly waking up at night with difficulty breathing or needing to urinate.  Heavy breathing.  Extreme tiredness.  Swelling in the lower legs, ankles, and feet. In some cases of mild to moderate mitral regurgitation, there are no symptoms. How is this diagnosed? This condition may be diagnosed based on the results of a physical exam. Your health care provider will listen to your heart for an abnormal heart sound (murmur). You may also have other tests, including:  An echocardiogram. This test creates ultrasound images of the heart that allow your health care provider to see how the heart valves work while your heart is beating.  Chest X-ray.  Electrocardiogram (ECG). This is a test that records the electrical impulses of the heart.  Cardiac catheterization. This test is used to look at the structure and function of the heart. A thin tube (catheter) is passed through the blood vessels and into the heart. Dye is injected into the blood vessels so the cardiac system can be seen on images that are taken. How is this treated? This condition may be treated with:  Medicines. These may  be given to treat symptoms and prevent complications.  Surgery to repair or replace the mitral valve in severe, long-term (chronic) cases. Follow these instructions at home: Lifestyle  Limit alcohol intake to no more than 1 drink a day for nonpregnant women and 2 drinks a day for men. One drink equals 12 oz of beer, 5 oz of wine, or 1 oz of hard liquor.  Do not use any products that  contain nicotine or tobacco, such as cigarettes and e-cigarettes. If you need help quitting, ask your health care provider.  Eat a heart-healthy diet that includes plenty of fresh fruits and vegetables, whole grains, low-fat (lean) protein, and low-fat dairy products. Consider working with a diet and nutrition specialist (dietitian) to help you make healthy food choices.  Limit the amount of salt (sodium) in your diet. Avoid adding salt to foods, and avoid foods that are high in salt, such as:  Pickles.  Smoked and cured meats.  Processed foods.  Maintain a healthy weight and stay physically active. Ask your health care provider to recommend activities that are safe for you.  Try to get 7 or more hours of sleep each night.  Find ways to manage stress. If you need help with this, ask your health care provider. General instructions  Take over-the-counter and prescription medicines only as told by your health care provider.  Work closely with your health care provider to manage any other health conditions you have, such as diabetes or high blood pressure.  If you plan to become pregnant, talk with your health care provider first.  Keep all follow-up visits as told by your health care provider. This is important. Contact a health care provider if:  You have a fever.  You feel more tired than usual when doing physical activity.  You have a dry cough. Get help right away if:  You have shortness of breath.  You develop chest pain.  You have swelling in your hands, feet, ankles, or abdomen that is getting worse.  You have trouble staying awake or you faint.  You feel dizzy or unsteady.  You suddenly gain weight.  You feel confused.  Any of your symptoms begin to get worse. These symptoms may represent a serious problem that is an emergency. Do not wait to see if the symptoms will go away. Get medical help right away. Call your local emergency services (911 in the [place] Do  not drive yourself to the hospital.  Summary  Mitral valve regurgitation, also called mitral regurgitation, is a condition in which blood leaks from a valve between two chambers of the heart (mitral valve).  Depending on how severe your condition is, you may be treated with medicines or surgery.  Practice heart-healthy habits to manage this condition. These include limiting alcohol, avoiding nicotine and tobacco, and eating a balanced diet that is low in salt (sodium). This information is not intended to replace advice given to you by your health care provider. Make sure you discuss any questions you have with your health care provider. Document Released: 01/15/2005 Document Revised: 08/09/2016 Document Reviewed: 08/09/2016 Elsevier Interactive Patient Education  2017 [place]. Transesophageal Echocardiogram Transesophageal echocardiography (TEE) is a picture test of your heart using sound waves. The pictures taken can give very detailed pictures of your heart. This can help your doctor see if there are problems with your heart. TEE can check:  If your heart has blood clots in it.  How well your heart valves are working.  If  you have an infection on the inside of your heart.  Some of the major arteries of your heart.  If your heart valve is working after a Office [employer].  Your heart before a procedure that uses a shock to your heart to get the rhythm back to normal. What happens before the procedure?  Do not eat or drink for 6 hours before the procedure or as told by your doctor.  Make plans to have someone drive you home after the procedure. Do not drive yourself home.  An IV tube will be put in your arm. What happens during the procedure?  You will be given a medicine to help you relax (sedative). It will be given through the IV tube.  A numbing medicine will be sprayed or gargled in the back of your throat to help numb it.  The tip of the probe is placed into the back of your  mouth. You will be asked to swallow. This helps to pass the probe into your esophagus.  Once the tip of the probe is in the right place, your doctor can take pictures of your heart.  You may feel pressure at the back of your throat. What happens after the procedure?  You will be taken to a recovery area so the sedative can wear off.  Your throat may be sore and scratchy. This will go away slowly over time.  You will go home when you are fully awake and able to swallow liquids.  You should have someone stay with you for the next 24 hours.  Do not drive or operate machinery for the next 24 hours. This information is not intended to replace advice given to you by your health care provider. Make sure you discuss any questions you have with your health care provider. Document Released: 08/25/2009 Document Revised: 04/04/2016 Document Reviewed: 04/29/2013 Elsevier Interactive Patient Education  2017 [employer].

## 2016-11-25 ENCOUNTER — Telehealth: Payer: Self-pay | Admitting: Cardiology

## 2016-11-25 NOTE — Telephone Encounter (Signed)
Returned call to patient left message on personal voice mail I will send message to Dr.Turner and let her know you do not want to have TEE.

## 2016-11-25 NOTE — Telephone Encounter (Signed)
Please let patient know that he now has pulmonary HTN that is significant and likely due to his heart valve.  If this progresses it will become irreversible and lead to CHF

## 2016-11-25 NOTE — Telephone Encounter (Signed)
New message   Pt verbalized that he is calling to decline TEE

## 2016-12-02 NOTE — Telephone Encounter (Signed)
Left message to call back  

## 2016-12-04 ENCOUNTER — Encounter: Payer: Self-pay | Admitting: Cardiology

## 2016-12-04 NOTE — Telephone Encounter (Signed)
Returned patient's call. Left message to call back.

## 2016-12-04 NOTE — Telephone Encounter (Signed)
Patient is returning your call,thanks. °

## 2016-12-04 NOTE — Telephone Encounter (Signed)
This encounter was created in error - please disregard.

## 2016-12-10 NOTE — Telephone Encounter (Signed)
Reiterated to patient in detail that his PHTN is new and significant. He understands that this could be due to his heart valve, could become irreversible and lead to CHF. He is adamant about not having procedure done at this time. Instructed patient to call if he changes his mind or symptoms occur. He was grateful for call.

## 2017-01-14 ENCOUNTER — Other Ambulatory Visit: Payer: Self-pay | Admitting: Cardiology

## 2017-03-20 ENCOUNTER — Other Ambulatory Visit: Payer: Self-pay | Admitting: Cardiology

## 2017-08-29 ENCOUNTER — Other Ambulatory Visit: Payer: Self-pay | Admitting: Cardiology

## 2017-09-23 ENCOUNTER — Encounter: Payer: Self-pay | Admitting: Cardiology

## 2017-11-18 ENCOUNTER — Emergency Department (HOSPITAL_COMMUNITY): Payer: Medicare Other

## 2017-11-18 ENCOUNTER — Encounter (HOSPITAL_COMMUNITY): Payer: Self-pay | Admitting: Emergency Medicine

## 2017-11-18 ENCOUNTER — Inpatient Hospital Stay (HOSPITAL_COMMUNITY)
Admission: EM | Admit: 2017-11-18 | Discharge: 2017-11-27 | DRG: 286 | Disposition: A | Discharge status: Home Health | Payer: Medicare Other | Attending: Internal Medicine | Admitting: Internal Medicine

## 2017-11-18 ENCOUNTER — Ambulatory Visit: Payer: Medicare Other | Admitting: Cardiology

## 2017-11-18 ENCOUNTER — Other Ambulatory Visit: Payer: Self-pay

## 2017-11-18 DIAGNOSIS — E876 Hypokalemia: Secondary | ICD-10-CM | POA: Diagnosis present

## 2017-11-18 DIAGNOSIS — N400 Enlarged prostate without lower urinary tract symptoms: Secondary | ICD-10-CM | POA: Diagnosis present

## 2017-11-18 DIAGNOSIS — I10 Essential (primary) hypertension: Secondary | ICD-10-CM | POA: Diagnosis not present

## 2017-11-18 DIAGNOSIS — H9190 Unspecified hearing loss, unspecified ear: Secondary | ICD-10-CM | POA: Diagnosis present

## 2017-11-18 DIAGNOSIS — I5041 Acute combined systolic (congestive) and diastolic (congestive) heart failure: Secondary | ICD-10-CM | POA: Diagnosis not present

## 2017-11-18 DIAGNOSIS — E0590 Thyrotoxicosis, unspecified without thyrotoxic crisis or storm: Secondary | ICD-10-CM | POA: Diagnosis present

## 2017-11-18 DIAGNOSIS — I4821 Permanent atrial fibrillation: Secondary | ICD-10-CM | POA: Diagnosis present

## 2017-11-18 DIAGNOSIS — E119 Type 2 diabetes mellitus without complications: Secondary | ICD-10-CM | POA: Diagnosis not present

## 2017-11-18 DIAGNOSIS — Z7901 Long term (current) use of anticoagulants: Secondary | ICD-10-CM | POA: Diagnosis not present

## 2017-11-18 DIAGNOSIS — R739 Hyperglycemia, unspecified: Secondary | ICD-10-CM | POA: Diagnosis present

## 2017-11-18 DIAGNOSIS — I509 Heart failure, unspecified: Secondary | ICD-10-CM | POA: Diagnosis not present

## 2017-11-18 DIAGNOSIS — E222 Syndrome of inappropriate secretion of antidiuretic hormone: Secondary | ICD-10-CM | POA: Diagnosis not present

## 2017-11-18 DIAGNOSIS — I429 Cardiomyopathy, unspecified: Secondary | ICD-10-CM | POA: Diagnosis not present

## 2017-11-18 DIAGNOSIS — R001 Bradycardia, unspecified: Secondary | ICD-10-CM | POA: Diagnosis present

## 2017-11-18 DIAGNOSIS — E7849 Other hyperlipidemia: Secondary | ICD-10-CM | POA: Diagnosis not present

## 2017-11-18 DIAGNOSIS — Z8249 Family history of ischemic heart disease and other diseases of the circulatory system: Secondary | ICD-10-CM

## 2017-11-18 DIAGNOSIS — I482 Chronic atrial fibrillation, unspecified: Secondary | ICD-10-CM

## 2017-11-18 DIAGNOSIS — I5021 Acute systolic (congestive) heart failure: Secondary | ICD-10-CM | POA: Diagnosis present

## 2017-11-18 DIAGNOSIS — Z792 Long term (current) use of antibiotics: Secondary | ICD-10-CM | POA: Diagnosis not present

## 2017-11-18 DIAGNOSIS — G4733 Obstructive sleep apnea (adult) (pediatric): Secondary | ICD-10-CM | POA: Diagnosis present

## 2017-11-18 DIAGNOSIS — I2582 Chronic total occlusion of coronary artery: Secondary | ICD-10-CM | POA: Diagnosis present

## 2017-11-18 DIAGNOSIS — E669 Obesity, unspecified: Secondary | ICD-10-CM | POA: Diagnosis not present

## 2017-11-18 DIAGNOSIS — R791 Abnormal coagulation profile: Secondary | ICD-10-CM | POA: Diagnosis present

## 2017-11-18 DIAGNOSIS — I5043 Acute on chronic combined systolic (congestive) and diastolic (congestive) heart failure: Secondary | ICD-10-CM | POA: Diagnosis not present

## 2017-11-18 DIAGNOSIS — I340 Nonrheumatic mitral (valve) insufficiency: Secondary | ICD-10-CM | POA: Diagnosis present

## 2017-11-18 DIAGNOSIS — I25118 Atherosclerotic heart disease of native coronary artery with other forms of angina pectoris: Secondary | ICD-10-CM | POA: Diagnosis not present

## 2017-11-18 DIAGNOSIS — R0902 Hypoxemia: Secondary | ICD-10-CM | POA: Diagnosis present

## 2017-11-18 DIAGNOSIS — I2510 Atherosclerotic heart disease of native coronary artery without angina pectoris: Secondary | ICD-10-CM | POA: Diagnosis present

## 2017-11-18 DIAGNOSIS — I25110 Atherosclerotic heart disease of native coronary artery with unstable angina pectoris: Secondary | ICD-10-CM | POA: Diagnosis not present

## 2017-11-18 DIAGNOSIS — I255 Ischemic cardiomyopathy: Secondary | ICD-10-CM | POA: Diagnosis present

## 2017-11-18 DIAGNOSIS — Z79899 Other long term (current) drug therapy: Secondary | ICD-10-CM | POA: Diagnosis not present

## 2017-11-18 DIAGNOSIS — E871 Hypo-osmolality and hyponatremia: Secondary | ICD-10-CM | POA: Diagnosis present

## 2017-11-18 DIAGNOSIS — I341 Nonrheumatic mitral (valve) prolapse: Secondary | ICD-10-CM | POA: Diagnosis present

## 2017-11-18 DIAGNOSIS — I272 Pulmonary hypertension, unspecified: Secondary | ICD-10-CM | POA: Diagnosis present

## 2017-11-18 DIAGNOSIS — R946 Abnormal results of thyroid function studies: Secondary | ICD-10-CM | POA: Diagnosis not present

## 2017-11-18 DIAGNOSIS — I4891 Unspecified atrial fibrillation: Secondary | ICD-10-CM | POA: Diagnosis not present

## 2017-11-18 DIAGNOSIS — E878 Other disorders of electrolyte and fluid balance, not elsewhere classified: Secondary | ICD-10-CM | POA: Diagnosis present

## 2017-11-18 DIAGNOSIS — I110 Hypertensive heart disease with heart failure: Secondary | ICD-10-CM | POA: Diagnosis present

## 2017-11-18 DIAGNOSIS — E785 Hyperlipidemia, unspecified: Secondary | ICD-10-CM | POA: Diagnosis present

## 2017-11-18 DIAGNOSIS — I420 Dilated cardiomyopathy: Secondary | ICD-10-CM | POA: Diagnosis not present

## 2017-11-18 DIAGNOSIS — I5033 Acute on chronic diastolic (congestive) heart failure: Secondary | ICD-10-CM | POA: Diagnosis not present

## 2017-11-18 DIAGNOSIS — I5023 Acute on chronic systolic (congestive) heart failure: Secondary | ICD-10-CM | POA: Diagnosis not present

## 2017-11-18 DIAGNOSIS — Z888 Allergy status to other drugs, medicaments and biological substances status: Secondary | ICD-10-CM

## 2017-11-18 HISTORY — DX: Unspecified malignant neoplasm of skin, unspecified: C44.90

## 2017-11-18 LAB — BRAIN NATRIURETIC PEPTIDE: B Natriuretic Peptide: 1421.3 pg/mL — ABNORMAL HIGH (ref 0.0–100.0)

## 2017-11-18 LAB — BASIC METABOLIC PANEL
Anion gap: 12 (ref 5–15)
Anion gap: 16 — ABNORMAL HIGH (ref 5–15)
Anion gap: 13 (ref 5–15)
Anion gap: 12 (ref 5–15)
BUN: 20 mg/dL (ref 6–20)
BUN: 19 mg/dL (ref 6–20)
BUN: 19 mg/dL (ref 6–20)
BUN: 20 mg/dL (ref 6–20)
CO2: 20 mmol/L — ABNORMAL LOW (ref 22–32)
CO2: 18 mmol/L — ABNORMAL LOW (ref 22–32)
CO2: 20 mmol/L — ABNORMAL LOW (ref 22–32)
CO2: 21 mmol/L — ABNORMAL LOW (ref 22–32)
Calcium: 8.3 mg/dL — ABNORMAL LOW (ref 8.9–10.3)
Calcium: 8.3 mg/dL — ABNORMAL LOW (ref 8.9–10.3)
Calcium: 8.2 mg/dL — ABNORMAL LOW (ref 8.9–10.3)
Calcium: 8.4 mg/dL — ABNORMAL LOW (ref 8.9–10.3)
Chloride: 86 mmol/L — ABNORMAL LOW (ref 101–111)
Chloride: 84 mmol/L — ABNORMAL LOW (ref 101–111)
Chloride: 86 mmol/L — ABNORMAL LOW (ref 101–111)
Chloride: 86 mmol/L — ABNORMAL LOW (ref 101–111)
Creatinine, Ser: 0.97 mg/dL (ref 0.61–1.24)
Creatinine, Ser: 0.99 mg/dL (ref 0.61–1.24)
Creatinine, Ser: 0.99 mg/dL (ref 0.61–1.24)
Creatinine, Ser: 1.13 mg/dL (ref 0.61–1.24)
GFR calc Af Amer: >60 mL/min (ref >60)
GFR calc Af Amer: >60 mL/min (ref >60)
GFR calc Af Amer: >60 mL/min (ref >60)
GFR calc Af Amer: >60 mL/min (ref >60)
GFR calc non Af Amer: >60 mL/min (ref >60)
GFR calc non Af Amer: >60 mL/min (ref >60)
GFR calc non Af Amer: >60 mL/min (ref >60)
GFR calc non Af Amer: 57 mL/min — ABNORMAL LOW (ref >60)
Glucose, Bld: 145 mg/dL — ABNORMAL HIGH (ref 65–99)
Glucose, Bld: 176 mg/dL — ABNORMAL HIGH (ref 65–99)
Glucose, Bld: 201 mg/dL — ABNORMAL HIGH (ref 65–99)
Glucose, Bld: 184 mg/dL — ABNORMAL HIGH (ref 65–99)
Potassium: 4.7 mmol/L (ref 3.5–5.1)
Potassium: 4.3 mmol/L (ref 3.5–5.1)
Potassium: 4.5 mmol/L (ref 3.5–5.1)
Potassium: 4.5 mmol/L (ref 3.5–5.1)
Sodium: 118 mmol/L — CL (ref 135–145)
Sodium: 118 mmol/L — CL (ref 135–145)
Sodium: 119 mmol/L — CL (ref 135–145)
Sodium: 119 mmol/L — CL (ref 135–145)

## 2017-11-18 LAB — URINALYSIS, ROUTINE W REFLEX MICROSCOPIC
Bacteria, UA: NONE SEEN
Bilirubin Urine: NEGATIVE
Glucose, UA: NEGATIVE mg/dL
Ketones, ur: 5 mg/dL — AB
Leukocytes, UA: NEGATIVE
Nitrite: NEGATIVE
Protein, ur: NEGATIVE mg/dL
Specific Gravity, Urine: 1.008 (ref 1.005–1.030)
Squamous Epithelial / LPF: NONE SEEN
pH: 6 (ref 5.0–8.0)

## 2017-11-18 LAB — RESPIRATORY PANEL BY PCR

## 2017-11-18 LAB — CBC
HCT: 38.8 % — ABNORMAL LOW (ref 39.0–52.0)
Hemoglobin: 13.5 g/dL (ref 13.0–17.0)
MCH: 30.8 pg (ref 26.0–34.0)
MCHC: 34.8 g/dL (ref 30.0–36.0)
MCV: 88.6 fL (ref 78.0–100.0)
Platelets: 344 10*3/uL (ref 150–400)
RBC: 4.38 MIL/uL (ref 4.22–5.81)
RDW: 12.9 % (ref 11.5–15.5)
WBC: 14 10*3/uL — ABNORMAL HIGH (ref 4.0–10.5)

## 2017-11-18 LAB — OSMOLALITY, URINE: Osmolality, Ur: 343 mOsm/kg (ref 300–900)

## 2017-11-18 LAB — T4, FREE: Free T4: 1.91 ng/dL — ABNORMAL HIGH (ref 0.61–1.12)

## 2017-11-18 LAB — I-STAT TROPONIN, ED
Troponin i, poc: 0.08 ng/mL (ref 0.00–0.08)
Troponin i, poc: 0.09 ng/mL (ref 0.00–0.08)

## 2017-11-18 LAB — PROTIME-INR
INR: >10
INR: >10
Prothrombin Time: >90 seconds — ABNORMAL HIGH (ref 11.4–15.2)
Prothrombin Time: >90 seconds — ABNORMAL HIGH (ref 11.4–15.2)

## 2017-11-18 LAB — TROPONIN I: Troponin I: 0.09 ng/mL (ref <0.03)

## 2017-11-18 LAB — TSH: TSH: 0.574 u[IU]/mL (ref 0.350–4.500)

## 2017-11-18 MED ORDER — FINASTERIDE 5 MG PO TABS
5.0000 mg | ORAL_TABLET | Freq: Every day | ORAL | Status: DC
  Administered 2017-11-19 – 2017-11-27 (×9): 5 mg via ORAL
  Filled 2017-11-18 (×9): qty 1

## 2017-11-18 MED ORDER — DOCUSATE SODIUM 100 MG PO CAPS
200.0000 mg | ORAL_CAPSULE | Freq: Every day | ORAL | Status: DC
  Administered 2017-11-18 – 2017-11-23 (×6): 200 mg via ORAL
  Filled 2017-11-18 (×8): qty 2

## 2017-11-18 MED ORDER — DILTIAZEM LOAD VIA INFUSION
10.0000 mg | Freq: Once | INTRAVENOUS | Status: AC
  Administered 2017-11-18: 10 mg via INTRAVENOUS
  Filled 2017-11-18: qty 10

## 2017-11-18 MED ORDER — ONDANSETRON HCL 4 MG/2ML IJ SOLN: 4.0000 mg | Freq: Four times a day (QID) | INTRAMUSCULAR | Status: DC | PRN

## 2017-11-18 MED ORDER — MUSCLE RUB 10-15 % EX CREA
1.0000 "application " | TOPICAL_CREAM | CUTANEOUS | Status: DC | PRN
  Filled 2017-11-18: qty 85

## 2017-11-18 MED ORDER — ALUM & MAG HYDROXIDE-SIMETH 200-200-20 MG/5ML PO SUSP: 30.0000 mL | ORAL | Status: DC | PRN

## 2017-11-18 MED ORDER — SODIUM CHLORIDE 0.9% FLUSH: 3.0000 mL | INTRAVENOUS | Status: DC | PRN

## 2017-11-18 MED ORDER — FUROSEMIDE 10 MG/ML IJ SOLN
40.0000 mg | Freq: Once | INTRAMUSCULAR | Status: AC
  Administered 2017-11-18: 40 mg via INTRAVENOUS
  Filled 2017-11-18: qty 4

## 2017-11-18 MED ORDER — POLYVINYL ALCOHOL 1.4 % OP SOLN
2.0000 [drp] | OPHTHALMIC | Status: DC | PRN
  Filled 2017-11-18: qty 15

## 2017-11-18 MED ORDER — DILTIAZEM HCL-DEXTROSE 100-5 MG/100ML-% IV SOLN (PREMIX): 5.0000 mg/h | Freq: Once | INTRAVENOUS | Status: DC

## 2017-11-18 MED ORDER — ALBUTEROL SULFATE (2.5 MG/3ML) 0.083% IN NEBU: 5.0000 mg | INHALATION_SOLUTION | Freq: Once | RESPIRATORY_TRACT | Status: DC

## 2017-11-18 MED ORDER — PHYTONADIONE 5 MG PO TABS
5.0000 mg | ORAL_TABLET | Freq: Once | ORAL | Status: AC
  Administered 2017-11-18: 5 mg via ORAL
  Filled 2017-11-18: qty 1

## 2017-11-18 MED ORDER — SODIUM CHLORIDE 0.9% FLUSH
3.0000 mL | Freq: Two times a day (BID) | INTRAVENOUS | Status: DC
  Administered 2017-11-19 – 2017-11-23 (×4): 3 mL via INTRAVENOUS

## 2017-11-18 MED ORDER — ASPIRIN EC 81 MG PO TBEC
81.0000 mg | DELAYED_RELEASE_TABLET | Freq: Every day | ORAL | Status: DC
  Administered 2017-11-19 – 2017-11-27 (×9): 81 mg via ORAL
  Filled 2017-11-18 (×9): qty 1

## 2017-11-18 MED ORDER — PHENOL 1.4 % MT LIQD: 1.0000 | OROMUCOSAL | Status: DC | PRN

## 2017-11-18 MED ORDER — LIP MEDEX EX OINT: 1.0000 "application " | TOPICAL_OINTMENT | CUTANEOUS | Status: DC | PRN

## 2017-11-18 MED ORDER — SODIUM CHLORIDE 0.9 % IV SOLN: 250.0000 mL | INTRAVENOUS | Status: DC | PRN

## 2017-11-18 MED ORDER — ATORVASTATIN CALCIUM 20 MG PO TABS
20.0000 mg | ORAL_TABLET | Freq: Every day | ORAL | Status: DC
  Administered 2017-11-18 – 2017-11-26 (×9): 20 mg via ORAL
  Filled 2017-11-18 (×9): qty 1

## 2017-11-18 MED ORDER — LORATADINE 10 MG PO TABS
10.0000 mg | ORAL_TABLET | Freq: Every day | ORAL | Status: DC | PRN
  Administered 2017-11-19 – 2017-11-25 (×3): 10 mg via ORAL
  Filled 2017-11-18 (×4): qty 1

## 2017-11-18 MED ORDER — DILTIAZEM HCL-DEXTROSE 100-5 MG/100ML-% IV SOLN (PREMIX)
5.0000 mg/h | INTRAVENOUS | Status: DC
  Administered 2017-11-18: 15 mg/h via INTRAVENOUS
  Administered 2017-11-18: 5 mg/h via INTRAVENOUS
  Administered 2017-11-19 (×2): 15 mg/h via INTRAVENOUS
  Filled 2017-11-18 (×7): qty 100

## 2017-11-18 MED ORDER — FUROSEMIDE 10 MG/ML IJ SOLN
40.0000 mg | Freq: Two times a day (BID) | INTRAMUSCULAR | Status: DC
  Administered 2017-11-18 – 2017-11-20 (×4): 40 mg via INTRAVENOUS
  Filled 2017-11-18 (×4): qty 4

## 2017-11-18 MED ORDER — SALINE SPRAY 0.65 % NA SOLN
1.0000 | NASAL | Status: DC | PRN
  Administered 2017-11-21: 1 via NASAL
  Filled 2017-11-18: qty 44

## 2017-11-18 MED ORDER — BLISTEX MEDICATED EX OINT
TOPICAL_OINTMENT | CUTANEOUS | Status: DC | PRN
  Administered 2017-11-20: 10:00:00 via TOPICAL
  Filled 2017-11-18: qty 6.3

## 2017-11-18 MED ORDER — GUAIFENESIN-DM 100-10 MG/5ML PO SYRP
5.0000 mL | ORAL_SOLUTION | ORAL | Status: DC | PRN
  Administered 2017-11-19 – 2017-11-26 (×11): 5 mL via ORAL
  Filled 2017-11-18 (×11): qty 5

## 2017-11-18 MED ORDER — ACETAMINOPHEN 325 MG PO TABS
650.0000 mg | ORAL_TABLET | ORAL | Status: DC | PRN
Start: 2017-11-18 — End: 2017-11-24
  Administered 2017-11-24: 650 mg via ORAL
  Filled 2017-11-18: qty 2

## 2017-11-18 MED ORDER — DILTIAZEM HCL-DEXTROSE 100-5 MG/100ML-% IV SOLN (PREMIX): 5.0000 mg/h | INTRAVENOUS | Status: DC

## 2017-11-18 NOTE — ED Provider Notes (Addendum)
[facility] EMERGENCY DEPARTMENT Provider Note   CSN: [id number] Arrival date & time: 11/18/17  0608     History   Chief Complaint Chief Complaint  Patient presents with  . Shortness of Breath    HPI Ryan Savage is a 84 y.o. male with history of chronic atrial fibrillation on Coumadin, hyperlipidemia, hypertension, mitral regurgitation presents to ED for evaluation of generalized malaise, decreased activity and cough for 3 weeks. Developed shortness of breath last night, unable to sleep. States he usually exercises every other day but has been unable to for the last 3 weeks due to symptoms. Initially SOB worse with exertion but now also at rest. Denies fevers, chills, chest pain, nausea, vomiting, abdominal pain, urinary symptoms, diarrhea, melena, hematochezia. No LE edema, calf tenderness, abdominal distention. H/o OSA not on CPAP.    HPI  Past Medical History:  Diagnosis Date  . Atrial fibrillation, chronic (HCC)    Chronic..coumadin clinic -Dr Radford Pax  . BPH (benign prostatic hypertrophy)   . [name] (degenerative joint disease)   . Hyperlipidemia    LDL goal less then 100  . Hypertension   . Microscopic hematuria    [name]  . Mitral valve prolapse    with moderate MR  . OSA (obstructive sleep apnea)    intolerant with CPAP  . SIADH (syndrome of inappropriate ADH production) ([place])   . Thrombocytopenia (HCC)    Mild- platelet count 143,000 on 02/2011, stable 08/2011    Patient Active Problem List   Diagnosis Date Noted  . Mitral valve prolapse   . Permanent atrial fibrillation ([place]) 11/08/2013  . Benign essential HTN 11/08/2013  . Mitral regurgitation 11/08/2013    Past Surgical History:  Procedure Laterality Date  . APPENDECTOMY  1950  . ruptured disc repair  1959       Home Medications    Prior to Admission medications   Medication Sig Start Date End Date Taking? Authorizing Provider  acetaminophen (TYLENOL) 325 MG tablet Take  650 mg by mouth every 6 (six) hours as needed for mild pain.   Yes [provider]  benazepril (LOTENSIN) 40 MG tablet Take 1 tablet (40 mg total) by mouth daily. 03/20/17  Yes Turner, Eber Hong, MD  chlorpheniramine-HYDROcodone (TUSSIONEX PENNKINETIC ER) 10-8 MG/5ML SUER Take 5 mLs by mouth at bedtime.   Yes [provider]  diltiazem (CARDIZEM CD) 240 MG 24 hr capsule Take 1 capsule (240 mg total) by mouth daily. Please call and schedule an appointment for future refills 1st attempt 08/29/17  Yes Turner, Eber Hong, MD  finasteride (PROSCAR) 5 MG tablet Take 5 mg by mouth daily.  09/06/13  Yes [provider]  guaiFENesin (MUCINEX) 600 MG 12 hr tablet Take 600 mg by mouth 2 (two) times daily.   Yes [provider]  levofloxacin (LEVAQUIN) 750 MG tablet Take 750 mg by mouth daily.   Yes [provider]  Multiple Vitamin (MULTIVITAMIN) capsule Take by mouth.   Yes [provider]  simvastatin (ZOCOR) 10 MG tablet Take 10 mg by mouth daily.   Yes [provider]  warfarin (COUMADIN) 2 MG tablet Take 3.5-4 mg by mouth See admin instructions. Pt takes 4mg  once daily x1 day, then takes 3.5mg  once daily x2 days, then repeats cycle   Yes [provider]    Family History Family History  Problem Relation Age of Onset  . Heart failure Mother   . Heart attack Father   . [name] Father   .  Heart attack Brother   . [name] Brother   . Colon cancer Brother   . Colon cancer Sister     Social History Social History   Tobacco Use  . Smoking status: Never Smoker  . Smokeless tobacco: Never Used  Substance Use Topics  . Alcohol use: No  . Drug use: No     Allergies   Novocain [procaine]   Review of Systems Review of Systems  Constitutional: Positive for activity change and fatigue.  Respiratory: Positive for cough and shortness of breath.   Neurological: Positive for weakness (generalized).  All other systems reviewed and are  negative.    Physical Exam Updated Vital Signs BP 113/73   Pulse (!) 113   Temp 98.1 F (36.7 C) (Oral)   Resp (!) 29   Ht 5\' 10"  (1.778 m)   Wt 86.2 kg (190 lb)   SpO2 96%   BMI 27.26 kg/m   Physical Exam  Constitutional: He is oriented to person, place, and time. He appears well-developed and well-nourished. No distress.  On 4 L [place]  HENT:  Head: Normocephalic and atraumatic.  Right Ear: External ear normal.  Left Ear: External ear normal.  Nose: Nose normal.  Moist mucous membranes   Eyes: Conjunctivae and EOM are normal. No scleral icterus.  Neck: Normal range of motion. Neck supple.  Cardiovascular: Intact distal pulses. An irregularly irregular rhythm present. Tachycardia present.  Murmur heard. Pulses:      Radial pulses are 2+ on the right side, and 2+ on the left side.       Dorsalis pedis pulses are 2+ on the right side, and 2+ on the left side.  No LE edema or calf tenderness. HR 130-140  Pulmonary/Chest: Tachypnea noted. He has decreased breath sounds in the right middle field, the right lower field, the left middle field and the left lower field. He has no wheezes.  Hypoxic, requiring 4 L [place]. Quiet breath sounds to lower lobes bilaterally. No rales.   Abdominal: Soft. Bowel sounds are normal. There is no tenderness.  NTND  Musculoskeletal: Normal range of motion. He exhibits no deformity.  Neurological: He is alert and oriented to person, place, and time.  Skin: Skin is warm and dry. Capillary refill takes less than 2 seconds.  Psychiatric: He has a normal mood and affect. His behavior is normal. Judgment and thought content normal.  Nursing note and vitals reviewed.    ED Treatments / Results  Labs (all labs ordered are listed, but only abnormal results are displayed) Labs Reviewed  BASIC METABOLIC PANEL - Abnormal; Notable for the following components:      Result Value   Sodium 118 (*)    Chloride 86 (*)    CO2 20 (*)    Glucose, Bld 145 (*)     Calcium 8.3 (*)    All other components within normal limits  CBC - Abnormal; Notable for the following components:   WBC 14.0 (*)    HCT 38.8 (*)    All other components within normal limits  BRAIN NATRIURETIC PEPTIDE - Abnormal; Notable for the following components:   B Natriuretic Peptide 1,421.3 (*)    All other components within normal limits  BASIC METABOLIC PANEL - Abnormal; Notable for the following components:   Sodium 118 (*)    Chloride 84 (*)    CO2 18 (*)    Glucose, Bld 176 (*)    Calcium 8.3 (*)    Anion gap 16 (*)  All other components within normal limits  URINALYSIS, ROUTINE W REFLEX MICROSCOPIC - Abnormal; Notable for the following components:   Hgb urine dipstick MODERATE (*)    Ketones, ur 5 (*)    All other components within normal limits  RESPIRATORY PANEL BY PCR  OSMOLALITY, URINE  PROTIME-INR  TSH  T4, FREE  I-STAT TROPONIN, ED  I-STAT TROPONIN, ED    EKG  EKG Interpretation  Date/Time:  Tuesday November 18 2017 06:09:20 EST Ventricular Rate:  127 PR Interval:    QRS Duration: 81 QT Interval:  287 QTC Calculation: 418 R Axis:   -55 Text Interpretation:  Atrial fibrillation Ventricular premature complex Inferior infarct, old Probable anterior infarct, old Confirmed by Randal Buba, April ([id number]) on 11/18/2017 7:16:05 AM       Radiology Dg Chest Portable 1 View  Result Date: 11/18/2017 CLINICAL DATA:  Cough.  Shortness of breath. EXAM: PORTABLE CHEST 1 VIEW COMPARISON:  CT 02/15/2010. FINDINGS: Cardiomegaly. Diffuse bilateral interstitial prominence. Changes consistent with interstitial edema and/or pneumonitis. No pleural effusion or pneumothorax. No acute bony abnormality. IMPRESSION: Cardiomegaly with diffuse bilateral pulmonary interstitial prominence consistent with interstitial edema. Pneumonitis cannot be excluded. Electronically Signed   By: Marcello Moores  Register   On: 11/18/2017 07:14    Procedures Procedures (including critical care  time)  CRITICAL CARE Performed by: Kinnie Feil   Total critical care time: 60 minutes  Critical care time was exclusive of separately billable procedures and treating other patients.  Critical care was necessary to treat or prevent imminent or life-threatening deterioration.  Critical care was time spent personally by me on the following activities: development of treatment plan with patient and/or surrogate as well as nursing, discussions with consultants, evaluation of patient's response to treatment, examination of patient, obtaining history from patient or surrogate, ordering and performing treatments and interventions, ordering and review of laboratory studies, ordering and review of radiographic studies, pulse oximetry and re-evaluation of patient's condition.  MDM Reviewed: previous chart, nursing note and vitals Reviewed previous: labs, ECG, x-ray and CT scan Interpretation: labs Total time providing critical care: 30-74 minutes. This excludes time spent performing separately reportable procedures and services.     Medications Ordered in ED Medications  albuterol (PROVENTIL) (2.5 MG/3ML) 0.083% nebulizer solution 5 mg (0 mg Nebulization Hold 11/18/17 0729)  diltiazem (CARDIZEM) 1 mg/mL load via infusion 10 mg (10 mg Intravenous Bolus from Bag 11/18/17 0649)    And  diltiazem (CARDIZEM) 100 mg in dextrose 5% 165mL (1 mg/mL) infusion (12.5 mg/hr Intravenous Rate/Dose Change 11/18/17 0901)  furosemide (LASIX) injection 40 mg (40 mg Intravenous Given 11/18/17 0724)     Initial Impression / Assessment and Plan / ED Course  I have reviewed the triage vital signs and the nursing notes.  Pertinent labs & imaging results that were available during my care of the patient were reviewed by me and considered in my medical decision making (see chart for details).  Clinical Course as of Nov 19 1011  Tue Nov 18, 2017  0644 Pulse Rate: (!) 121 [CG]  0645 Resp: (!) 26 [CG]  0645 SpO2:  97 % [CG]  0645 SpO2: 92 % [CG]  0705 WBC: (!) 14.0 [CG]  0715 HR 110-120  [CG]  0718 Sodium: (!!) 118 [CG]  0718 Chloride: (!) 86 [CG]  0741  IMPRESSION: Cardiomegaly with diffuse bilateral pulmonary interstitial prominence consistent with interstitial edema. Pneumonitis cannot be excluded. DG Chest Portable 1 View [CG]  0856 Hgb urine dipstick: (!) MODERATE [CG]  0856 Reevaluated patient. Heart rate fluctuating between 105-125, mostly in the 120s. Still requiring 4 L nasal cannula. Update to patient and family regarding chest x-ray results and low sodium. Recommended admission pending lab work, he is agreeable. Will increase cardizem to 12.5 mg/hr   [CG]    Clinical Course User Index [CG] Kinnie Feil, PA-C   84 year old male with history of chronic atrial fibrillation on Coumadin and MR presents for SOB. Preceding cough, generalized malaise and weakness for the last 3 weeks. No chest pain, fevers, history of heart failure.  Recent echo showed worsening MR and new moderate pulmonary HTN.  0630: On exam, tachycardic in atrial fibrillation with RVR initial heart rate 130-140. No chest pain. He is short of breath and requiring 4 L nasal cannula.  Afebrile. No LE edema but decreased breath sounds bilaterally to lower lobes. We'll evaluate for acute CHF exacerbation, ACS, pneumonia. Low suspicion for pulmonary embolism. Will start cardizem bolus/gtt, lasix, breathing tx. Pending labs and imaging.  0715: CXR shows pulmonary edema. Leukocytosis WBC 14. Initial BMP with Na 118, Cl 86. Will re-check. Weakness may be due to this. Not having symptoms of severe hyponatremia, no seizures, cramps, headaches, gait instability. Creatine normal.  Final Clinical Impressions(s) / ED Diagnoses   1000: Repeat BMP with Na 118 and Cl 18. BNP 1421. Pending delta trop, PT/INR, TSH/T4, respiratory panel. Will consult cardiology and medicine for acute CHF exacerbation, hyponatremia and hypochloremia. HR now  95-105. Final diagnoses:  Acute systolic congestive heart failure [facility])  Hyponatremia    ED Discharge Orders    None       Arlean Hopping 11/18/17 1027    Palumbo, April, MD 11/18/17 2310    Kinnie Feil, PA-C 11/19/17 Tanna Furry, April, MD 11/19/17 2310

## 2017-11-18 NOTE — Consult Note (Signed)
Cardiology Consult    Patient ID: Ryan Savage MRN: [id number], DOB/AGE: [birth year]   Admit date: 11/18/2017 Date of Consult: 11/18/2017  Primary Physician: Lajean Manes, MD Primary Cardiologist: Radford Pax Requesting Provider: Lorin Mercy Reason for Consultation: CHF  Ryan Savage is a 84 y.o. male who is being seen today for the evaluation of CHF at the request of Dr. Lorin Mercy.  Patient Profile    84 yo male with PMH of posterior MV leaflet prolapse with moderate MR, HTN, and chronic Afib who presented with ongoing cough and shortness of breath.   Past Medical History   Past Medical History:  Diagnosis Date  . Atrial fibrillation, chronic (HCC)    Chronic..coumadin clinic -Dr Radford Pax  . BPH (benign prostatic hypertrophy)   . [name] (degenerative joint disease)   . Hyperlipidemia    LDL goal less then 100  . Hypertension   . Microscopic hematuria    [name]  . Mitral valve prolapse    with moderate MR  . OSA (obstructive sleep apnea)    intolerant with CPAP  . SIADH (syndrome of inappropriate ADH production) ([place])   . Thrombocytopenia (HCC)    Mild- platelet count 143,000 on 02/2011, stable 08/2011    Past Surgical History:  Procedure Laterality Date  . APPENDECTOMY  1950  . ruptured disc repair  1959     Allergies  Allergies  Allergen Reactions  . Novocain [Procaine] Rash    History of Present Illness   Ryan Savage is an 84 yo male with PMH of posterior MV leaflet prolapse with moderate MR, HTN, and chronic Afib. He was seen back in 12/17 by [name] in routine f/u. Echo was repeated at that time to reassess his MR. Echo showed normal EF with mild MVP and moderate MR, severe LAE and RAE and TV prolapse with mild to moderate TR and moderate pulmonary HTN. He was followed up in the clinic with Ellen Henri who discussed the option of TEE to further assess his valve. At the time he felt well and wanted to wait and discuss things with his wife. He eventually opted  to not have TEE done.   Reports being in his usual state of health until about 3 weeks ago. Developed a nonproductive cough, attempted to treat with cough meds given by PCP. No relieve, and developed PND, and orthopnea. Decreased appetite and abd fullness. Was given additional medications by PCP with no improvement. Then set up for a CXR today but dyspnea became more significant. Presented to the ED.  In the ED his labs showed Na+ 118, Cr 0.99, BNP 1421, Trop 0.08>>0.09, Hgb 13.5, INR >10, TSH 0.574. EKG showed Afib RVR. CXR with cardiomegaly and bilateral pulmonary edema. Was started on IV Dilt with rate improved. Also given IV lasix 40mg  x1 with some UOP thus far.   Inpatient Medications    . albuterol  5 mg Nebulization Once    Family History    Family History  Problem Relation Age of Onset  . Heart failure Mother   . Heart attack Father   . [name] Father   . Heart attack Brother   . [name] Brother   . Colon cancer Brother   . Colon cancer Sister     Social History    Social History   Socioeconomic History  . Marital status: Married    Spouse name: Not on file  . Number of children: Not on file  . Years of education: Not on file  . Highest  education level: Not on file  Social Needs  . Financial resource strain: Not on file  . Food insecurity - worry: Not on file  . Food insecurity - inability: Not on file  . Transportation needs - medical: Not on file  . Transportation needs - non-medical: Not on file  Occupational History  . Occupation: retired  Tobacco Use  . Smoking status: Current Every Day Smoker    Years: 3.00  . Smokeless tobacco: Never Used  Substance and Sexual Activity  . Alcohol use: No  . Drug use: No  . Sexual activity: Not on file  Other Topics Concern  . Not on file  Social History Narrative  . Not on file     Review of Systems    See HPI  All other systems reviewed and are otherwise negative except as noted above.  Physical Exam    Blood  pressure (!) 111/53, pulse (!) 113, temperature 98.1 F (36.7 C), temperature source Oral, resp. rate (!) 31, height 5\' 10"  (1.778 m), weight 190 lb (86.2 kg), SpO2 (!) 85 %.  General: Pleasant, older WM, no NAD but increased work of breathing. Psych: Normal affect. Neuro: Alert and oriented X 3. Moves all extremities spontaneously. HEENT: Normal  Neck: Supple, mild JVD. Lungs:  Resp regular, mildly labored, diminished with crackles. Heart: Irreg Irreg no s3, s4, + systolic murmurs. Abdomen: Soft, non-tender, non-distended, BS + x 4.  Extremities: No clubbing, cyanosis, 1+ LE edema. DP/PT/Radials 2+ and equal bilaterally.  Labs    Troponin (Point of Care Test) Recent Labs    11/18/17 1017  TROPIPOC 0.09*   No results for input(s): CKTOTAL, CKMB, TROPONINI in the last 72 hours. Lab Results  Component Value Date   WBC 14.0 (H) 11/18/2017   HGB 13.5 11/18/2017   HCT 38.8 (L) 11/18/2017   MCV 88.6 11/18/2017   PLT 344 11/18/2017    Recent Labs  Lab 11/18/17 0855  NA 118*  K 4.3  CL 84*  CO2 18*  BUN 19  CREATININE 0.99  CALCIUM 8.3*  GLUCOSE 176*   No results found for: CHOL, HDL, LDLCALC, TRIG No results found for: [place] [facility]   Radiology Studies    Dg Chest Portable 1 View  Result Date: 11/18/2017 CLINICAL DATA:  Cough.  Shortness of breath. EXAM: PORTABLE CHEST 1 VIEW COMPARISON:  CT 02/15/2010. FINDINGS: Cardiomegaly. Diffuse bilateral interstitial prominence. Changes consistent with interstitial edema and/or pneumonitis. No pleural effusion or pneumothorax. No acute bony abnormality. IMPRESSION: Cardiomegaly with diffuse bilateral pulmonary interstitial prominence consistent with interstitial edema. Pneumonitis cannot be excluded. Electronically Signed   By: Marcello Moores  Register   On: 11/18/2017 07:14    ECG & Cardiac Imaging    EKG: AFib RVR  Echo: 12/17  Study Conclusions  - Left ventricle: The cavity size was normal. Systolic function was   normal. The estimated  ejection fraction was in the range of 60%   to 65%. Wall motion was normal; there were no regional wall   motion abnormalities. - Mitral valve: Mild, late systolicprolapse, involving the   posterior leaflet. There was moderate regurgitation directed   eccentrically and toward the septum. - Left atrium: The atrium was severely dilated. - Right atrium: The atrium was severely dilated. - Tricuspid valve: Mild, late systolicprolapse. There was   mild-moderate regurgitation directed centrally. - Pulmonary arteries: Systolic pressure was mildly increased. PA   peak pressure: 42 mm Hg (S).  Assessment & Plan    84 yo male with  PMH of posterior MV leaflet prolapse with moderate MR, HTN, and chronic Afib who presented with ongoing cough and shortness of breath.   1. Acute on Chronic Diastolic HF: Developed a nagging cough about 3 weeks ago, along with orthopnea/PND. BNP >1400, CXR with bilateral pulmonary edema. All in the setting of Afib RVR and valvular disease.  -- continue IV diuresis -- check echo  2. Afib RVR: Known chronic Afib, on coumadin for [place]. Rate was elevated on admission, was started on Dilt drip with rate improvement. INR >10? Question accuracy, though MAR dose show Levaquin. Will repeat. Hold anticoagulation if INR remains elevated.  -- continue IV Dilt drip with plans to transition to oral dosing once appropriate.   3. MV prolapse with moderate MR: Noted on echo from 12/17. Suspect this may be playing a significant role in clinical picture. PLan to recheck echo. Consider TEE to further assess valve once improved clinical to consider options moving forward.   4. HTN: Stable with current therapy  5. HL: on statin  6. Hyponatremia: Na+ 118 on admission, hopefully with improve with diuresis.   Barnet Pall, NP-C Pager [phone] 11/18/2017, 12:55 PM  I have personally seen and examined this patient with Ryan Bellis, NP. I agree with the assessment and plan as  outlined above. He is known to have normal LV systolic function by echo in 2017 and moderate MR. He has persistent atrial fibrillation and is on coumadin. He reports worsening dyspnea and fatigue for several weeks with ankle edema and orthopnea.  He is found to be in atrial fibrillation today with elevated HR in the 120s. Chest x-ray shows diffuse pulmonary edema.  I have personally reviewed his echo from 2017 which shows normal LV systolic function. There is moderate MR.  Labs reviewed today. INR is over 10-suspected to be lab error.  Serum sodium is 118.  My exam:  General: Well developed, well nourished, NAD  HEENT: OP clear, mucus membranes moist  SKIN: warm, dry. No rashes. Neuro: No focal deficits  Musculoskeletal: Muscle strength 5/5 all ext  Psychiatric: Mood and affect normal  Neck: + JVD Lungs: Bibasilar crackles.  Cardiovascular: Irreg irreg, tachy. Systolic murmur noted.  Abdomen:Soft. Bowel sounds present. Non-tender.  Extremities: Trace to 1+ bilateral LE edema.  Plan:  1. Atrial fib with RVR: Rate controlled currently on Cardizem drip. Will continue today.  2. Acute on chronic diastolic CHF: He is volume overloaded. Likely exacerbated by combination of rapid HR with atrial fib and his mitral regurgitation. Will plan aggressive diuresis with IV Lasix today. Will arrange echo to assess LV function 3. Mitral regurgitation: Moderate by echo in 2017. Will repeat echo now.  4. Hyponatremia: Will hopefully improve with diuresis. Repeat BMET in am.  5. Supratherapeutic INR: Plan to repeat INR now as this is felt to be a lab error.   Lauree Chandler 11/18/2017 2:32 PM

## 2017-11-18 NOTE — ED Notes (Signed)
ED Provider at bedside. 

## 2017-11-18 NOTE — H&P (Signed)
History and Physical    Ryan Savage OHY:[id number] DOB: September 30, 1929 DOA: 11/18/2017  PCP: Lajean Manes, MD Consultants:  Radford Pax - cardiology Patient coming from:  Home - lives with wife; NOK: wife, [phone]  Chief Complaint:  Coughing  HPI: Ryan Savage is a 84 y.o. male with medical history significant of SIADH, OSA not on CPAP, MVP, HTN, HLD, thrombocytopenia, and afib on Coumadin presenting with SOB, weakness, chest congestion and coughing.  He reports having gone 3 weeks with no sleep.  Poor PO intake, no appetite.  He developed congestion 2-3 days ago with significant worsening of his SOB.  SOB is primarily with exertion.  Cough is a deep cough, very little phlegm that is dark, brown, or white; 1 time with a trace of blood.  No chest pain.  No confusion.  Weakness has led to inability to do his usual activities, but prior to 3 weeks ago he was in [employer] and walking in the park.  Now, he can't even take the trash can to the street.  No fever.  No sick contacts.  ED Course:  Acute new CHF exacerbation and hyponatremia.  Chronic afib on Coumadin.  SOB that worsened last night, malaise and unable to exercise, cough x 3 weeks.  Afib with RVR 130-140 with hypoxia (4L [place] O2).  CXR with pulmonary edema.  BNP 1400.  Given Lasix, breathing treatments, Cardizem drip.  Review of Systems: As per HPI; otherwise review of systems reviewed and negative.   Ambulatory Status:  Ambulates without assistance  Past Medical History:  Diagnosis Date  . Atrial fibrillation, chronic (HCC)    Chronic..coumadin clinic -Dr Radford Pax  . BPH (benign prostatic hypertrophy)   . [name] (degenerative joint disease)   . Hyperlipidemia    LDL goal less then 100  . Hypertension   . Microscopic hematuria    [name]  . Mitral valve prolapse    with moderate MR  . OSA (obstructive sleep apnea)    intolerant with CPAP  . SIADH (syndrome of inappropriate ADH production) ([place])   . Thrombocytopenia (HCC)     Mild- platelet count 143,000 on 02/2011, stable 08/2011    Past Surgical History:  Procedure Laterality Date  . APPENDECTOMY  1950  . ruptured disc repair  1959    Social History   Socioeconomic History  . Marital status: Married    Spouse name: Not on file  . Number of children: Not on file  . Years of education: Not on file  . Highest education level: Not on file  Social Needs  . Financial resource strain: Not on file  . Food insecurity - worry: Not on file  . Food insecurity - inability: Not on file  . Transportation needs - medical: Not on file  . Transportation needs - non-medical: Not on file  Occupational History  . Occupation: retired  Tobacco Use  . Smoking status: Current Every Day Smoker    Years: 3.00  . Smokeless tobacco: Never Used  Substance and Sexual Activity  . Alcohol use: No  . Drug use: No  . Sexual activity: Not on file  Other Topics Concern  . Not on file  Social History Narrative  . Not on file    Allergies  Allergen Reactions  . Novocain [Procaine] Rash    Family History  Problem Relation Age of Onset  . Heart failure Mother   . Heart attack Father   . [name] Father   . Heart attack Brother   .  [name] Brother   . Colon cancer Brother   . Colon cancer Sister     Prior to Admission medications   Medication Sig Start Date End Date Taking? Authorizing Provider  acetaminophen (TYLENOL) 325 MG tablet Take 650 mg by mouth every 6 (six) hours as needed for mild pain.   Yes [provider]  benazepril (LOTENSIN) 40 MG tablet Take 1 tablet (40 mg total) by mouth daily. 03/20/17  Yes Turner, Eber Hong, MD  chlorpheniramine-HYDROcodone (TUSSIONEX PENNKINETIC ER) 10-8 MG/5ML SUER Take 5 mLs by mouth at bedtime.   Yes [provider]  diltiazem (CARDIZEM CD) 240 MG 24 hr capsule Take 1 capsule (240 mg total) by mouth daily. Please call and schedule an appointment for future refills 1st attempt 08/29/17  Yes Turner, Eber Hong, MD    finasteride (PROSCAR) 5 MG tablet Take 5 mg by mouth daily.  09/06/13  Yes [provider]  guaiFENesin (MUCINEX) 600 MG 12 hr tablet Take 600 mg by mouth 2 (two) times daily.   Yes [provider]  levofloxacin (LEVAQUIN) 750 MG tablet Take 750 mg by mouth daily.   Yes [provider]  Multiple Vitamin (MULTIVITAMIN) capsule Take by mouth.   Yes [provider]  simvastatin (ZOCOR) 10 MG tablet Take 10 mg by mouth daily.   Yes [provider]  warfarin (COUMADIN) 2 MG tablet Take 3.5-4 mg by mouth See admin instructions. Pt takes 4mg  once daily x1 day, then takes 3.5mg  once daily x2 days, then repeats cycle   Yes [provider]    Physical Exam: Vitals:   11/18/17 1315 11/18/17 1415 11/18/17 1500 11/18/17 1515  BP: 111/68 118/69 (!) 109/52 (!) 114/56  Pulse: (!) 105 (!) 109  97  Resp: (!) 21 (!) 21 (!) 26 (!) 28  Temp:      TempSrc:      SpO2: 96% 93% 95% 92%  Weight:      Height:         General:  Appears calm and comfortable and is NAD Eyes:  PERRL, EOMI, normal lids, iris ENT:  Hard of hearing, normal lips & tongue, mmm Neck:  no LAD, masses or thyromegaly; no carotid bruits Cardiovascular:  Irregularly irregular, no r/g, 3/6 systolic murmur. 2+ dependent LE edema.  Respiratory: L>R basilar crackles.  Normal respiratory effort on [place] O2. Abdomen:  soft, NT, ND, NABS Back:   normal alignment, no CVAT Skin:  no rash or induration seen on limited exam Musculoskeletal:  grossly normal tone BUE/BLE, good ROM, no bony abnormality Psychiatric:  grossly normal mood and affect, speech fluent and appropriate, AOx3 Neurologic:  CN 2-12 grossly intact, moves all extremities in coordinated fashion, sensation intact    Radiological Exams on Admission: Dg Chest Portable 1 View  Result Date: 11/18/2017 CLINICAL DATA:  Cough.  Shortness of breath. EXAM: PORTABLE CHEST 1 VIEW COMPARISON:  CT 02/15/2010. FINDINGS: Cardiomegaly. Diffuse  bilateral interstitial prominence. Changes consistent with interstitial edema and/or pneumonitis. No pleural effusion or pneumothorax. No acute bony abnormality. IMPRESSION: Cardiomegaly with diffuse bilateral pulmonary interstitial prominence consistent with interstitial edema. Pneumonitis cannot be excluded. Electronically Signed   By: Marcello Moores  Register   On: 11/18/2017 07:14    EKG: Independently reviewed.  Afib with rate 127; nonspecific ST changes with no evidence of acute ischemia   Labs on Admission: I have personally reviewed the available labs and imaging studies at the time of the admission.  Pertinent labs:   Na++ 119, 118;  no prior for comparison Glucose 201, 176 BNP 1421.3 Troponin 0.08, 0.09, 0.09 INR >10 TSH 0.574, free T4 1.91 UA: 5 ketones, moderate Hgb Urine Osm 343 WBC 14.0, CBC otherwise unremarkable  Assessment/Plan Principal Problem:   Acute exacerbation of CHF (congestive heart failure) (HCC) Active Problems:   Permanent atrial fibrillation (HCC)   Benign essential HTN   Mitral valve prolapse   Hyponatremia with excess extracellular fluid volume   Hyperglycemia   Supratherapeutic INR   CHF exacerbation -Patient without prior h/o respiratory failure or known CHF (cut known valvular disease) presenting with worsening SOB and hypoxia despite recent antibiotics -CXR consistent with pulmonary edema -Elevated BNP -MVP and afib are likely contributors -With elevated BNP and abnl CXR, new-onset CHF seems most probable as diagnosis -Will admit to SDU -Will request echocardiogram -Will start ASA -Patient is likely to benefit from an ACE (has been on benazepril) but will hold for now -No beta blocker due to bradycardia on presentation -CHF order set utilized; may need CHF team consult but will hold until Echo results are available -Cardiology consult requested and appreciated -Was given Lasix 40 mg x 1 in ER and will repeat with 40 mg IV BID -Continue [place] O2 for  now -Normal kidney function at this time, will follow -Repeat EKG in AM -Stable troponin elevation, likely demand ischemia  Severe hyponatremia due to excess volume -Etiology appears to be hypervolemic hyponatremia related to volume overload -SIADH is also a consideration; he has a reported h/o this in the past. -Because the patient is generally asymptomatic, it is unlikely that this extent of hyponatremia developed overnight - and we do not have prior values for comparison -Will continue to diurese -In an effort to prevent overcorrection (>23mEq/L/day so as to avoid central pontine myelinolysis, will continue to monitor q4h BMP for now  Afib with supratherapeutic INR due to Coumadin Atrial Fibrillation:   -Patient presenting with chronic afib but with RVR -Will admit to SDU for Diltiazem drip as per protocol with plan to transition to PO Diltiazem once heart rate is controlled.   -CHA2DS2-VASc Score is >2 and so patient has been on Coumadin. -He currently has a very supratherapeutic INR, with confirmed INR >10. -This may be attributed to Levaquin, although it does not appear that he had many doses. -As per guidelines, since the patient has an INR >10 but no active bleeding, it would be appropriate to either hold Coumadin and simply monitor or to treat with PO Vitamin K 2.5-5 mg. -Given his age and concern for bleeding, will give PO 5 mg now and continue to follow IINR q12h. -If bleeding were to start, he would need more aggressive reversal therapy. -Pharmacy to dose INR order previously placed and so they can assist with management of this issue. -I would be reluctant to resume Coumadin given this issue but will defer to cardiology.  MVP -Likely contributing to CHF. -Repeat Echo in AM to assess. -Patient may also need TEE for better visualization; will defer to cardiology.  HTN -Cardizem drip -Holding Lotensin Consider addition of beta-blocker once current CHF exacerbation is  improved  Hyperglycemia -May be stress response -Will follow with fasting AM labs -It is unlikely that he will need acute or chronic treatment for this issue  DVT prophylaxis: Supratherapeutic INR Code Status:  Full - confirmed with patient/family; would desire only 72 hours of life support if needed Family Communication: Wife and son present throughout evaluation  Disposition Plan:  Home once clinically improved Consults called:  Cardiology; CM  Admission status: Admit - It is my clinical opinion that admission to INPATIENT is reasonable and necessary because this patient will require at least 2 midnights in the hospital to treat this condition based on the medical complexity of the problems presented.  Given the aforementioned information, the predictability of an adverse outcome is felt to be significant.   Total critical care time: 100 minutes Critical care time was exclusive of separately billable procedures and treating other patients. Critical care was necessary to treat or prevent imminent or life-threatening deterioration. Critical care was time spent personally by me on the following activities: development of treatment plan with patient and/or surrogate as well as nursing, discussions with consultants, evaluation of patient's response to treatment, examination of patient, obtaining history from patient or surrogate, ordering and performing treatments and interventions, ordering and review of laboratory studies, ordering and review of radiographic studies, pulse oximetry and re-evaluation of patient's condition.   Karmen Bongo MD [facility]  If note is complete, please contact covering daytime or nighttime physician. [email or web address] Password [email or web address]  11/18/2017, 5:52 PM

## 2017-11-18 NOTE — Progress Notes (Signed)
ANTICOAGULATION CONSULT NOTE - Initial Consult  Pharmacy Consult for Warfarin Indication: atrial fibrillation  Allergies  Allergen Reactions  . Novocain [Procaine] Rash    Patient Measurements: Height: 5\' 10"  (177.8 cm) Weight: 190 lb (86.2 kg) IBW/kg (Calculated) : 73  Vital Signs: Temp: 98.1 F (36.7 C) (01/08 0612) Temp Source: Oral (01/08 0612) BP: 109/52 (01/08 1500) Pulse Rate: 109 (01/08 1415)  Labs: Recent Labs    11/18/17 0614 11/18/17 0855 11/18/17 1105  HGB 13.5  --   --   HCT 38.8*  --   --   PLT 344  --   --   LABPROT  --   --  >90.0*  INR  --   --  >10.00*  CREATININE 0.97 0.99  --     Estimated Creatinine Clearance: 55.3 mL/min (by C-G formula based on SCr of 0.99 mg/dL).   Medical History: Past Medical History:  Diagnosis Date  . Atrial fibrillation, chronic (HCC)    Chronic..coumadin clinic -Dr Radford Pax  . BPH (benign prostatic hypertrophy)   . [name] (degenerative joint disease)   . Hyperlipidemia    LDL goal less then 100  . Hypertension   . Microscopic hematuria    [name]  . Mitral valve prolapse    with moderate MR  . OSA (obstructive sleep apnea)    intolerant with CPAP  . SIADH (syndrome of inappropriate ADH production) ([place])   . Thrombocytopenia (HCC)    Mild- platelet count 143,000 on 02/2011, stable 08/2011    Medications:  Warfarin PTA 4mg  once daily x1 day then 3.5mg  once daily x2 days, then repeats cycle (last dose 4mg  on 1/7 per patient)  Assessment: 73 yom presented to ED with SOB w/ hx of Afib on warfarin PTA. Noted hx of thrombocytopenia (Plt WNL 344 on 1/8). Scr and Hgb WNL. INR >10.0 (re-draw due to potential lab error; repeat INR > 10.0 on 1/8). No bleeding noted, and vitamin K PO 5mg  x1 was ordered.   Goal of Therapy:  INR 2-3 Monitor platelets by anticoagulation protocol: Yes   Plan:  No warfarin indicated at this time due to elevated INR > 10.0 on 1/8. INR q12h per MD for now F/u CBC and signs of  bleeding  Kambrie Eddleman 11/18/2017,3:22 PM

## 2017-11-18 NOTE — ED Notes (Signed)
Patient up to bedside to use urinal.  Becomes SOB with exertion.  This RN encouraged patient to call for assistance when needing to use the restroom again in the future.  Patient verbalized understanding

## 2017-11-18 NOTE — ED Notes (Signed)
Admitting at bedside 

## 2017-11-18 NOTE — ED Triage Notes (Signed)
Pt has been feeling poorly for two weeks, several trips to the PCP and he is still not feeling better.  Tonight he became very SOB.  Upon arrival EMS found him at 82% on room air, Gave 10 albuteral, .5 atrovent, 125 solu medral.

## 2017-11-18 NOTE — ED Notes (Signed)
Patient given something to drink per edp approval  

## 2017-11-19 ENCOUNTER — Other Ambulatory Visit: Payer: Self-pay

## 2017-11-19 ENCOUNTER — Ambulatory Visit (HOSPITAL_COMMUNITY): Payer: Medicare Other

## 2017-11-19 ENCOUNTER — Encounter (HOSPITAL_COMMUNITY): Payer: Self-pay | Admitting: General Practice

## 2017-11-19 DIAGNOSIS — E669 Obesity, unspecified: Secondary | ICD-10-CM

## 2017-11-19 DIAGNOSIS — E119 Type 2 diabetes mellitus without complications: Secondary | ICD-10-CM

## 2017-11-19 DIAGNOSIS — I5023 Acute on chronic systolic (congestive) heart failure: Secondary | ICD-10-CM

## 2017-11-19 DIAGNOSIS — E222 Syndrome of inappropriate secretion of antidiuretic hormone: Secondary | ICD-10-CM

## 2017-11-19 DIAGNOSIS — I509 Heart failure, unspecified: Secondary | ICD-10-CM

## 2017-11-19 DIAGNOSIS — I420 Dilated cardiomyopathy: Secondary | ICD-10-CM

## 2017-11-19 LAB — CBC WITH DIFFERENTIAL/PLATELET
Basophils Absolute: 0 10*3/uL (ref 0.0–0.1)
Basophils Relative: 0 %
Eosinophils Absolute: 0 10*3/uL (ref 0.0–0.7)
Eosinophils Relative: 0 %
HCT: 34.7 % — ABNORMAL LOW (ref 39.0–52.0)
Hemoglobin: 12.3 g/dL — ABNORMAL LOW (ref 13.0–17.0)
Lymphocytes Relative: 9 %
Lymphs Abs: 1 10*3/uL (ref 0.7–4.0)
MCH: 31.1 pg (ref 26.0–34.0)
MCHC: 35.4 g/dL (ref 30.0–36.0)
MCV: 87.8 fL (ref 78.0–100.0)
Monocytes Absolute: 0.7 10*3/uL (ref 0.1–1.0)
Monocytes Relative: 6 %
Neutro Abs: 10.3 10*3/uL — ABNORMAL HIGH (ref 1.7–7.7)
Neutrophils Relative %: 85 %
Platelets: 324 10*3/uL (ref 150–400)
RBC: 3.95 MIL/uL — ABNORMAL LOW (ref 4.22–5.81)
RDW: 12.9 % (ref 11.5–15.5)
WBC: 12.1 10*3/uL — ABNORMAL HIGH (ref 4.0–10.5)

## 2017-11-19 LAB — CBC
HCT: 34.3 % — ABNORMAL LOW (ref 39.0–52.0)
Hemoglobin: 11.7 g/dL — ABNORMAL LOW (ref 13.0–17.0)
MCH: 29.9 pg (ref 26.0–34.0)
MCHC: 34.1 g/dL (ref 30.0–36.0)
MCV: 87.7 fL (ref 78.0–100.0)
Platelets: 336 10*3/uL (ref 150–400)
RBC: 3.91 MIL/uL — ABNORMAL LOW (ref 4.22–5.81)
RDW: 12.9 % (ref 11.5–15.5)
WBC: 15.5 10*3/uL — ABNORMAL HIGH (ref 4.0–10.5)

## 2017-11-19 LAB — BASIC METABOLIC PANEL
Anion gap: 12 (ref 5–15)
Anion gap: 14 (ref 5–15)
Anion gap: 10 (ref 5–15)
BUN: 23 mg/dL — ABNORMAL HIGH (ref 6–20)
BUN: 23 mg/dL — ABNORMAL HIGH (ref 6–20)
BUN: 24 mg/dL — ABNORMAL HIGH (ref 6–20)
CO2: 22 mmol/L (ref 22–32)
CO2: 21 mmol/L — ABNORMAL LOW (ref 22–32)
CO2: 24 mmol/L (ref 22–32)
Calcium: 8.1 mg/dL — ABNORMAL LOW (ref 8.9–10.3)
Calcium: 8.3 mg/dL — ABNORMAL LOW (ref 8.9–10.3)
Calcium: 8.1 mg/dL — ABNORMAL LOW (ref 8.9–10.3)
Chloride: 84 mmol/L — ABNORMAL LOW (ref 101–111)
Chloride: 84 mmol/L — ABNORMAL LOW (ref 101–111)
Chloride: 84 mmol/L — ABNORMAL LOW (ref 101–111)
Creatinine, Ser: 1.01 mg/dL (ref 0.61–1.24)
Creatinine, Ser: 0.97 mg/dL (ref 0.61–1.24)
Creatinine, Ser: 1 mg/dL (ref 0.61–1.24)
GFR calc Af Amer: >60 mL/min (ref >60)
GFR calc Af Amer: >60 mL/min (ref >60)
GFR calc Af Amer: >60 mL/min (ref >60)
GFR calc non Af Amer: >60 mL/min (ref >60)
GFR calc non Af Amer: >60 mL/min (ref >60)
GFR calc non Af Amer: >60 mL/min (ref >60)
Glucose, Bld: 150 mg/dL — ABNORMAL HIGH (ref 65–99)
Glucose, Bld: 146 mg/dL — ABNORMAL HIGH (ref 65–99)
Glucose, Bld: 163 mg/dL — ABNORMAL HIGH (ref 65–99)
Potassium: 4.2 mmol/L (ref 3.5–5.1)
Potassium: 4.3 mmol/L (ref 3.5–5.1)
Potassium: 4.3 mmol/L (ref 3.5–5.1)
Sodium: 118 mmol/L — CL (ref 135–145)
Sodium: 119 mmol/L — CL (ref 135–145)
Sodium: 118 mmol/L — CL (ref 135–145)

## 2017-11-19 LAB — ECHOCARDIOGRAM COMPLETE
Height: 70 in
Weight: 3051.17 oz

## 2017-11-19 LAB — PROTIME-INR
INR: 9.83
INR: 3.06
INR: 2.51
Prothrombin Time: 78.1 seconds — ABNORMAL HIGH (ref 11.4–15.2)
Prothrombin Time: 31.4 seconds — ABNORMAL HIGH (ref 11.4–15.2)
Prothrombin Time: 26.9 seconds — ABNORMAL HIGH (ref 11.4–15.2)

## 2017-11-19 LAB — LIPID PANEL
Cholesterol: 74 mg/dL (ref 0–200)
HDL: 27 mg/dL — ABNORMAL LOW (ref >40)
LDL Cholesterol: 35 mg/dL (ref 0–99)
Total CHOL/HDL Ratio: 2.7 RATIO
Triglycerides: 58 mg/dL (ref <150)
VLDL: 12 mg/dL (ref 0–40)

## 2017-11-19 LAB — TROPONIN I
Troponin I: 0.08 ng/mL (ref <0.03)
Troponin I: 0.09 ng/mL (ref <0.03)

## 2017-11-19 LAB — SODIUM: Sodium: 119 mmol/L — CL (ref 135–145)

## 2017-11-19 MED ORDER — SODIUM CHLORIDE 0.9% FLUSH
3.0000 mL | Freq: Two times a day (BID) | INTRAVENOUS | Status: DC
  Administered 2017-11-19 – 2017-11-20 (×3): 3 mL via INTRAVENOUS

## 2017-11-19 MED ORDER — FUROSEMIDE 10 MG/ML IJ SOLN
20.0000 mg | Freq: Once | INTRAMUSCULAR | Status: AC
  Administered 2017-11-19: 20 mg via INTRAVENOUS
  Filled 2017-11-19: qty 2

## 2017-11-19 MED ORDER — METOPROLOL SUCCINATE ER 25 MG PO TB24
25.0000 mg | ORAL_TABLET | Freq: Every day | ORAL | Status: DC
  Administered 2017-11-19: 25 mg via ORAL

## 2017-11-19 MED ORDER — SODIUM CHLORIDE 0.9% FLUSH: 3.0000 mL | INTRAVENOUS | Status: DC | PRN

## 2017-11-19 MED ORDER — ASPIRIN 81 MG PO CHEW
81.0000 mg | CHEWABLE_TABLET | ORAL | Status: AC
  Administered 2017-11-20: 81 mg via ORAL
  Filled 2017-11-19: qty 1

## 2017-11-19 MED ORDER — SODIUM CHLORIDE 0.9 % IV SOLN
INTRAVENOUS | Status: DC
  Administered 2017-11-20: 06:00:00 via INTRAVENOUS

## 2017-11-19 MED ORDER — PHYTONADIONE 5 MG PO TABS
2.5000 mg | ORAL_TABLET | Freq: Once | ORAL | Status: AC
  Administered 2017-11-20: 2.5 mg via ORAL
  Filled 2017-11-19: qty 1

## 2017-11-19 MED ORDER — SODIUM CHLORIDE 0.9 % IV SOLN: 250.0000 mL | INTRAVENOUS | Status: DC | PRN

## 2017-11-19 MED ORDER — PERFLUTREN LIPID MICROSPHERE
1.0000 mL | INTRAVENOUS | Status: AC | PRN
  Administered 2017-11-19: 2 mL via INTRAVENOUS
  Filled 2017-11-19: qty 10

## 2017-11-19 NOTE — Progress Notes (Signed)
CRITICAL VALUE ALERT  Critical Value:  Sodium 118  Date & Time Notied:  11/19/17 0805  Provider Notified: Sherral Hammers, MD   Orders Received/Actions taken: Provider aware, low sodium during prior shift

## 2017-11-19 NOTE — Progress Notes (Signed)
[place] for heparin  Indication: atrial fibrillation  Allergies  Allergen Reactions  . Novocain [Procaine] Rash    Patient Measurements: Height: 5\' 10"  (177.8 cm)(stated) Weight: 190 lb 11.2 oz (86.5 kg) IBW/kg (Calculated) : 73  Vital Signs: Temp: 98.5 F (36.9 C) (01/09 0853) Temp Source: Oral (01/09 0853) BP: 118/64 (01/09 1633) Pulse Rate: 87 (01/09 1633)  Labs: Recent Labs    11/18/17 4580  11/18/17 1537 11/18/17 1548  11/18/17 2350 11/18/17 2354 11/19/17 0232 11/19/17 0556 11/19/17 1154  HGB 13.5  --   --   --   --  12.3*  --   --  11.7*  --   HCT 38.8*  --   --   --   --  34.7*  --   --  34.3*  --   PLT 344  --   --   --   --  324  --   --  336  --   LABPROT  --    < >  --  >90.0*  --   --  78.1*  --   --  31.4*  INR  --    < >  --  >10.00*  --   --  9.83*  --   --  3.06  CREATININE 0.97   < > 0.99  --    < > 1.01  --  0.97 1.00  --   TROPONINI  --   --  0.09*  --   --  0.08*  --  0.09*  --   --    < > = values in this interval not displayed.    Estimated Creatinine Clearance: 54.8 mL/min (by C-G formula based on SCr of 1 mg/dL).   Medical History: Past Medical History:  Diagnosis Date  . Atrial fibrillation, chronic (HCC)    Chronic..coumadin clinic -Dr Radford Pax  . BPH (benign prostatic hypertrophy)   . CHF (congestive heart failure) ([place])   . [name] (degenerative joint disease)   . Hyperlipidemia    LDL goal less then 100  . Hypertension   . Microscopic hematuria    [name]  . Mitral valve prolapse    with moderate MR  . OSA (obstructive sleep apnea)    intolerant with CPAP (11/19/2017)  . SIADH (syndrome of inappropriate ADH production) ([place])   . Skin cancer    "side of my nose" (11/19/2017)  . Thrombocytopenia (HCC)    Mild- platelet count 143,000 on 02/2011, stable 08/2011    Warfarin PTA 4mg  once daily x1 day then 3.5mg  once daily x2 days, then repeats cycle (last dose 4mg  on 1/7 per  patient)  Assessment: 12 yom presented to ED with SOB w/ hx of Afib on warfarin PTA. Noted hx of thrombocytopenia INR >10.0 on admit and vitamin K PO 5mg  x1 was ordered. Plans are now for [facility] on 1/10 pending INR trend and pharmacy consulted to start heparin when INR < 2.0 -INR= 3.06  Goal of Therapy:  INR 2-3 Monitor platelets by anticoagulation protocol: Yes   Plan:  -No heparin now with elevated INR -INR q12h per MD for now  Hildred Laser, Pharm D 11/19/2017 5:06 PM

## 2017-11-19 NOTE — Progress Notes (Signed)
  Echocardiogram 2D Echocardiogram has been performed.  Ryan Savage 11/19/2017, 10:43 AM

## 2017-11-19 NOTE — Progress Notes (Signed)
Had visit with patient who is very concerned about congestive heart failure and issues.  His associate pastor and his grandson were there visiting with him.  Had prayer with them. Conard Novak, Chaplain   11/19/17 1100  Clinical Encounter Type  Visited With Patient;Family;Other (Comment) Government social research officer)  Visit Type Initial;Spiritual support  Referral From Patient  Consult/Referral To Chaplain  Spiritual Encounters  Spiritual Needs Prayer;Emotional

## 2017-11-19 NOTE — Progress Notes (Signed)
Progress Note  Patient Name: Ryan Savage Date of Encounter: 11/19/2017  Primary Cardiologist: Radford Pax  Subjective   Breathing has improved significantly today.   Inpatient Medications    Scheduled Meds: . aspirin EC  81 mg Oral Daily  . atorvastatin  20 mg Oral q1800  . docusate sodium  200 mg Oral QHS  . finasteride  5 mg Oral Daily  . furosemide  40 mg Intravenous Q12H  . sodium chloride flush  3 mL Intravenous Q12H   Continuous Infusions: . sodium chloride    . diltiazem (CARDIZEM) infusion 15 mg/hr (11/19/17 1413)   PRN Meds: sodium chloride, acetaminophen, alum & mag hydroxide-simeth, guaiFENesin-dextromethorphan, lip balm, loratadine, MUSCLE RUB, ondansetron (ZOFRAN) IV, phenol, polyvinyl alcohol, sodium chloride, sodium chloride flush   Vital Signs    Vitals:   11/19/17 0847 11/19/17 0848 11/19/17 0850 11/19/17 0853  BP:    111/75  Pulse: (!) 51 99 76 88  Resp: (!) 28 (!) 25 (!) 26 (!) 24  Temp:    98.5 F (36.9 C)  TempSrc:    Oral  SpO2: (!) 88% 92% 97% 93%  Weight:      Height:        Intake/Output Summary (Last 24 hours) at 11/19/2017 1550 Last data filed at 11/19/2017 1308 Gross per 24 hour  Intake 1215 ml  Output 1200 ml  Net 15 ml   Filed Weights   11/18/17 0612 11/18/17 1840 11/19/17 0327  Weight: 190 lb (86.2 kg) 190 lb 11.2 oz (86.5 kg) 190 lb 11.2 oz (86.5 kg)    Telemetry    Afib rate 90s - Personally Reviewed  ECG    Afib - Personally Reviewed  Physical Exam   General: Well developed, well nourished, male appearing in no acute distress. Remains on [place]. Head: Normocephalic, atraumatic.  Neck: Supple, mild JVD. Lungs:  Resp regular and unlabored, Diminished in lower lobes. Heart: Irreg Irreg, S1, S2, no S3, S4, systolic murmur; no rub. Abdomen: Soft, non-tender, non-distended with normoactive bowel sounds. Extremities: No clubbing, cyanosis, edema. Distal pedal pulses are 2+ bilaterally. Neuro: Alert and oriented X 3. Moves all  extremities spontaneously. Psych: Normal affect.  Labs    Chemistry Recent Labs  Lab 11/18/17 2350 11/19/17 0232 11/19/17 0556  NA 118* 119* 118*  K 4.2 4.3 4.3  CL 84* 84* 84*  CO2 22 21* 24  GLUCOSE 150* 146* 163*  BUN 23* 23* 24*  CREATININE 1.01 0.97 1.00  CALCIUM 8.1* 8.3* 8.1*  GFRNONAA >60 >60 >60  GFRAA >60 >60 >60  ANIONGAP 12 14 10      Hematology Recent Labs  Lab 11/18/17 0614 11/18/17 2350 11/19/17 0556  WBC 14.0* 12.1* 15.5*  RBC 4.38 3.95* 3.91*  HGB 13.5 12.3* 11.7*  HCT 38.8* 34.7* 34.3*  MCV 88.6 87.8 87.7  MCH 30.8 31.1 29.9  MCHC 34.8 35.4 34.1  RDW 12.9 12.9 12.9  PLT 344 324 336    Cardiac Enzymes Recent Labs  Lab 11/18/17 1537 11/18/17 2350 11/19/17 0232  TROPONINI 0.09* 0.08* 0.09*    Recent Labs  Lab 11/18/17 0626 11/18/17 1017  TROPIPOC 0.08 0.09*     BNP Recent Labs  Lab 11/18/17 0855  BNP 1,421.3*     DDimer No results for input(s): DDIMER in the last 168 hours.    Radiology    Dg Chest Portable 1 View  Result Date: 11/18/2017 CLINICAL DATA:  Cough.  Shortness of breath. EXAM: PORTABLE CHEST 1 VIEW COMPARISON:  CT  02/15/2010. FINDINGS: Cardiomegaly. Diffuse bilateral interstitial prominence. Changes consistent with interstitial edema and/or pneumonitis. No pleural effusion or pneumothorax. No acute bony abnormality. IMPRESSION: Cardiomegaly with diffuse bilateral pulmonary interstitial prominence consistent with interstitial edema. Pneumonitis cannot be excluded. Electronically Signed   By: Marcello Moores  Register   On: 11/18/2017 07:14    Cardiac Studies   TTE: 11/19/16  Study Conclusions  - Left ventricle: Septal and apical akinesis Inferior wall   hypokinesis. The estimated ejection fraction was 35%. The study   is not technically sufficient to allow evaluation of LV diastolic   function. - Aortic valve: There was mild regurgitation. - Mitral valve: There was mild to moderate regurgitation. - Left atrium: The  atrium was moderately dilated. - Right atrium: The atrium was severely dilated. - Atrial septum: No defect or patent foramen ovale was identified.  Patient Profile     84 y.o. 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   Assessment & Plan    1. Acute systolic HF: Developed a nagging cough about 3 weeks ago, along with orthopnea/PND. BNP >1400, CXR with bilateral pulmonary edema. All in the setting of Afib RVR and valvular disease. 1.3L UOP, weight stable. Breathing much improved. Now with drop in EF to 35% compared to normal 2016 with septal and apical akinesis. Will plan for [facility] tomorrow pending INR and respiratory status.  -- continue IV diuresis  2. Afib RVR: Known chronic Afib, on coumadin for [place]. Rate was elevated on admission, was started on Dilt drip with rate improvement. INR >10 on admission, given vitamin K. Now 3 today. Given drop in EF will Dc Dilt and start Toprol XL for rate control.  TSH normal.  -- hold coumadin, heparin once INR <2.   3. MV prolapse with moderate MR: Noted on echo from 12/17. Echo this admission showed mild to moderate regurg.   4. HTN: Stable with current therapy. Adjustments as above.   5. HL: on statin  6. Hyponatremia: Na+ 118, management per primary.   Signed, Reino Bellis, NP  11/19/2017, 3:50 PM  Pager # [phone]   For questions or updates, please contact [place] Please consult [email or web address] for contact info under Cardiology/STEMI.   Patient seen, examined. Available data reviewed. Agree with findings, assessment, and plan as outlined by Reino Bellis, NP.   Wife at bedside. On my exam he is a pleasant elderly male in no distress.  Jugular venous pressure is elevated.  Lung fields are clear.  Heart is irregularly irregular and distant with a 2/6 systolic murmur at the left lower sternal border.  There is trace pretibial  edema.  I have reviewed all available data including the patient's radiographic studies, lab data, and echo.  He has a newly diagnosed cardiomyopathy in the context of atrial fibrillation with RVR.  He also has mitral regurgitation which is been considered moderate by recent echo.  Plan for right and left heart catheterization to evaluate potential causes of progressive LV dysfunction such as obstructive coronary disease and also to assess his hemodynamics in the setting of heart failure and mitral regurgitation.  I have reviewed the risks, indications, and alternatives to cardiac catheterization, possible angioplasty, and stenting with the patient. Risks include but are not limited to bleeding, infection, vascular injury, stroke, myocardial infection, arrhythmia, kidney injury, radiation-related injury in the case of prolonged fluoroscopy use, emergency cardiac surgery, and death. The patient understands the risks  of serious complication is 1-2 in 4680 with diagnostic cardiac cath and 1-2% or less with angioplasty/stenting.   INR trending down after Vit K. He understands we will have to see INR tomorrow am to see if cath can be done (INR <= 1.8).   Sherren Mocha, M.D. 11/19/2017 5:29 PM

## 2017-11-19 NOTE — Progress Notes (Signed)
PROGRESS NOTE    Ryan Savage  LKG:[id number] DOB: 12/19/1928 DOA: 11/18/2017 PCP: Lajean Manes, MD   Brief Narrative:  84 y.o. 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  He reports having gone 3 weeks with no sleep.  Poor PO intake, no appetite.  He developed congestion 2-3 days ago with significant worsening of his SOB.  SOB is primarily with exertion.  Cough is a deep cough, very little phlegm that is dark, brown, or white; 1 time with a trace of blood.  No chest pain.  No confusion.  Weakness has led to inability to do his usual activities, but prior to 3 weeks ago he was in [employer] and walking in the park.  Now, he can't even take the trash can to the street.  No fever.  No sick contacts.   ED Course:  Acute new CHF exacerbation and hyponatremia.  Chronic afib on Coumadin.  SOB that worsened last night, malaise and unable to exercise, cough x 3 weeks.  Afib with RVR 130-140 with hypoxia (4L [place] O2).  CXR with pulmonary edema.  BNP 1400.  Given Lasix, breathing treatments, Cardizem drip.    Subjective: 1/9 A/O 4, negative CP, negative SOB, negative abdominal pain, negative N/V. States until last week was [place] without problems. Has known about SIADH.Does not appear patient was observing fluid restriction.   Assessment & Plan:   Principal Problem:   Acute exacerbation of CHF (congestive heart failure) (HCC) Active Problems:   Permanent atrial fibrillation (HCC)   Benign essential HTN   Mitral valve prolapse   Hyponatremia with excess extracellular fluid volume   Hyperglycemia   Supratherapeutic INR   Acute Systolic CHF/cardiomyopathy@@@@ -negative prior history of CHF: CXR C/W pulmonary edemawith elevated BNP -strict I&O -Daily weight -lasix 40 mg BID -Toprol 25 mg daily -Cardiology will perform cardiac catheterization in a.m. if INR<1.8   Atrial fibrillation(CHA2DS2-VASc Score is >2)/Supratherapeutic INR -Coumadin on hold secondary to being supratherapeutic and patient requires cardiac catheterization -Currently in NSR Recent Labs  Lab 11/18/17 1105 11/18/17 1548 11/18/17 2354 11/19/17 1154 11/19/17 1758  INR >10.00* >10.00* 9.83* 3.06 2.51  -patient treated with vitamin K PO 5 mg1 dose -repeat vitamin K PO 2.5 Mg    Mitral valve prolapse -Cardiac catheterization planned for 1/10   Essential HTN  -See CHF  Severe Hyponatremia -Multifactorial hypervolemic secondary to CHF, SIADH (not observing fluid restriction)? -patient asymptomatic therefore current level of sodium must have been arrived at gradually: No history of altered mental status or seizure per patient and wife. -See SIADH and CHF  SIADH -Patient with history of SIADH may be exacerbating CHF findings of hyponatremia fluid restrict to 1088ml/dy -Serum Sodium  TID -do not correct> 8 mEq/L/day to avoid [place] Myelinolysis.   Hyperglycemia -May be stress response -Will follow with fasting AM labs -It is unlikely that he will need acute or chronic treatment for this issue    DVT prophylaxis: currently INR therapeutic. SCD when INR< 2 Code Status: full Family Communication: wife at bedside for discussion of plan care Disposition Plan: TBD   Consultants:  Cardiology     Procedures/Significant Events:  1/9 Echocardiogram:Left ventricle: -Septal and apical akinesis Inferior wall   hypokinesis. -LVEF=35%. -study not technically sufficient to allow evaluation of LV diastolic   function. - Mitral valve: mild to moderate regurgitation. - Left atrium: moderately dilated.-Right  atrium:severely dilated.    RIGHT/LEFT cardiac catheterization pending   I have personally reviewed and interpreted all radiology studies and my findings are as above.  VENTILATOR SETTINGS:    Cultures   Antimicrobials:    Devices    LINES /  TUBES:      Continuous Infusions: . sodium chloride    . diltiazem (CARDIZEM) infusion 15 mg/hr (11/19/17 0600)     Objective: Vitals:   11/18/17 1929 11/18/17 2320 11/19/17 0327 11/19/17 0331  BP: (!) 114/95 108/65  114/69  Pulse: 94 95  90  Resp: (!) 31 (!) 27  (!) 24  Temp: (!) 97.3 F (36.3 C)  98.7 F (37.1 C)   TempSrc: Oral  Oral   SpO2: 93% 95%  91%  Weight:   190 lb 11.2 oz (86.5 kg)   Height:        Intake/Output Summary (Last 24 hours) at 11/19/2017 0835 Last data filed at 11/19/2017 0175 Gross per 24 hour  Intake 1065 ml  Output 1350 ml  Net -285 ml   Filed Weights   11/18/17 0612 11/18/17 1840 11/19/17 0327  Weight: 190 lb (86.2 kg) 190 lb 11.2 oz (86.5 kg) 190 lb 11.2 oz (86.5 kg)    Examination:  General: A/O 4,No acute respiratory distress Neck:  Negative scars, masses, torticollis, lymphadenopathy, JVD Lungs: Clear to auscultation bilaterally without wheezes or crackles Cardiovascular: Regular rate and rhythm without murmur gallop or rub normal S1 and S2 Abdomen: negative abdominal pain, nondistended, positive soft, bowel sounds, no rebound, no ascites, no appreciable mass Extremities: No significant cyanosis, clubbing, or edema bilateral lower extremities Skin: Negative rashes, lesions, ulcers Psychiatric:  Negative depression, negative anxiety, negative fatigue, negative mania  Central nervous system:  Cranial nerves II through XII intact, tongue/uvula midline, all extremities muscle strength 5/5, sensation intact throughout,  negative dysarthria, negative expressive aphasia, negative receptive aphasia.  .     Data Reviewed: Care during the described time interval was provided by me .  I have reviewed this patient's available data, including medical history, events of note, physical examination, and all test results as part of my evaluation.   CBC: Recent Labs  Lab 11/18/17 0614 11/18/17 2350 11/19/17 0556  WBC 14.0* 12.1* 15.5*    NEUTROABS  --  10.3*  --   HGB 13.5 12.3* 11.7*  HCT 38.8* 34.7* 34.3*  MCV 88.6 87.8 87.7  PLT 344 324 102   Basic Metabolic Panel: Recent Labs  Lab 11/18/17 1537 11/18/17 1739 11/18/17 2350 11/19/17 0232 11/19/17 0556  NA 119* 119* 118* 119* 118*  K 4.5 4.5 4.2 4.3 4.3  CL 86* 86* 84* 84* 84*  CO2 20* 21* 22 21* 24  GLUCOSE 201* 184* 150* 146* 163*  BUN 19 20 23* 23* 24*  CREATININE 0.99 1.13 1.01 0.97 1.00  CALCIUM 8.2* 8.4* 8.1* 8.3* 8.1*   GFR: Estimated Creatinine Clearance: 54.8 mL/min (by C-G formula based on SCr of 1 mg/dL). Liver Function Tests: No results for input(s): AST, ALT, ALKPHOS, BILITOT, PROT, ALBUMIN in the last 168 hours. No results for input(s): LIPASE, AMYLASE in the last 168 hours. No results for input(s): AMMONIA in the last 168 hours. Coagulation Profile: Recent Labs  Lab 11/18/17 1105 11/18/17 1548 11/18/17 2354  INR >10.00* >10.00* 9.83*   Cardiac Enzymes: Recent Labs  Lab 11/18/17 1537 11/18/17 2350 11/19/17 0232  TROPONINI 0.09* 0.08* 0.09*   BNP (last 3 results) No results for input(s): PROBNP in the last 8760 hours.  HbA1C: No results for input(s): HGBA1C in the last 72 hours. CBG: No results for input(s): GLUCAP in the last 168 hours. Lipid Profile: Recent Labs    11/19/17 0232  CHOL 74  HDL 27*  LDLCALC 35  TRIG 58  CHOLHDL 2.7   Thyroid Function Tests: Recent Labs    11/18/17 0855  TSH 0.574  FREET4 1.91*   Anemia Panel: No results for input(s): VITAMINB12, FOLATE, FERRITIN, TIBC, IRON, RETICCTPCT in the last 72 hours. Urine analysis:    Component Value Date/Time   COLORURINE YELLOW 11/18/2017 0830   APPEARANCEUR CLEAR 11/18/2017 0830   LABSPEC 1.008 11/18/2017 0830   PHURINE 6.0 11/18/2017 0830   GLUCOSEU NEGATIVE 11/18/2017 0830   HGBUR MODERATE (A) 11/18/2017 0830   BILIRUBINUR NEGATIVE 11/18/2017 0830   KETONESUR 5 (A) 11/18/2017 0830   PROTEINUR NEGATIVE 11/18/2017 0830   NITRITE NEGATIVE  11/18/2017 0830   LEUKOCYTESUR NEGATIVE 11/18/2017 0830   Sepsis Labs: @LABRCNTIP (procalcitonin:4,lacticidven:4)  ) Recent Results (from the past 240 hour(s))  Respiratory Panel by PCR     Status: None   Collection Time: 11/18/17  8:55 AM  Result Value Ref Range Status   Adenovirus NOT DETECTED NOT DETECTED Final   Coronavirus 229E NOT DETECTED NOT DETECTED Final   Coronavirus HKU1 NOT DETECTED NOT DETECTED Final   Coronavirus NL63 NOT DETECTED NOT DETECTED Final   Coronavirus OC43 NOT DETECTED NOT DETECTED Final   Metapneumovirus NOT DETECTED NOT DETECTED Final   Rhinovirus / Enterovirus NOT DETECTED NOT DETECTED Final   Influenza A NOT DETECTED NOT DETECTED Final   Influenza B NOT DETECTED NOT DETECTED Final   Parainfluenza Virus 1 NOT DETECTED NOT DETECTED Final   Parainfluenza Virus 2 NOT DETECTED NOT DETECTED Final   Parainfluenza Virus 3 NOT DETECTED NOT DETECTED Final   Parainfluenza Virus 4 NOT DETECTED NOT DETECTED Final   Respiratory Syncytial Virus NOT DETECTED NOT DETECTED Final   Bordetella pertussis NOT DETECTED NOT DETECTED Final   Chlamydophila pneumoniae NOT DETECTED NOT DETECTED Final   Mycoplasma pneumoniae NOT DETECTED NOT DETECTED Final         Radiology Studies: Dg Chest Portable 1 View  Result Date: 11/18/2017 CLINICAL DATA:  Cough.  Shortness of breath. EXAM: PORTABLE CHEST 1 VIEW COMPARISON:  CT 02/15/2010. FINDINGS: Cardiomegaly. Diffuse bilateral interstitial prominence. Changes consistent with interstitial edema and/or pneumonitis. No pleural effusion or pneumothorax. No acute bony abnormality. IMPRESSION: Cardiomegaly with diffuse bilateral pulmonary interstitial prominence consistent with interstitial edema. Pneumonitis cannot be excluded. Electronically Signed   By: Antonito   On: 11/18/2017 07:14        Scheduled Meds: . aspirin EC  81 mg Oral Daily  . atorvastatin  20 mg Oral q1800  . docusate sodium  200 mg Oral QHS  .  finasteride  5 mg Oral Daily  . furosemide  40 mg Intravenous Q12H  . sodium chloride flush  3 mL Intravenous Q12H   Continuous Infusions: . sodium chloride    . diltiazem (CARDIZEM) infusion 15 mg/hr (11/19/17 0600)     LOS: 1 day    Time spent: 40 minutes    Cas Tracz, Geraldo Docker, MD [facility] Pager [phone]   If 7PM-7AM, please contact night-coverage [email or web address] Password [email or web address] 11/19/2017, 8:35 AM

## 2017-11-19 NOTE — Progress Notes (Signed)
CRITICAL VALUE ALERT  Critical Value:  Sodium 119  Date & Time Notied:  11/19/17 1923  Provider Notified: Yes  Orders Received/Actions taken: Attending aware.

## 2017-11-20 LAB — PROTIME-INR
INR: 2.2
INR: 2.05
INR: 2.02
Prothrombin Time: 24.2 seconds — ABNORMAL HIGH (ref 11.4–15.2)
Prothrombin Time: 22.9 seconds — ABNORMAL HIGH (ref 11.4–15.2)
Prothrombin Time: 22.7 seconds — ABNORMAL HIGH (ref 11.4–15.2)

## 2017-11-20 LAB — BASIC METABOLIC PANEL
Anion gap: 10 (ref 5–15)
BUN: 24 mg/dL — ABNORMAL HIGH (ref 6–20)
CO2: 26 mmol/L (ref 22–32)
Calcium: 7.9 mg/dL — ABNORMAL LOW (ref 8.9–10.3)
Chloride: 83 mmol/L — ABNORMAL LOW (ref 101–111)
Creatinine, Ser: 0.89 mg/dL (ref 0.61–1.24)
GFR calc Af Amer: >60 mL/min (ref >60)
GFR calc non Af Amer: >60 mL/min (ref >60)
Glucose, Bld: 133 mg/dL — ABNORMAL HIGH (ref 65–99)
Potassium: 4.1 mmol/L (ref 3.5–5.1)
Sodium: 119 mmol/L — CL (ref 135–145)

## 2017-11-20 LAB — SODIUM
Sodium: 122 mmol/L — ABNORMAL LOW (ref 135–145)
Sodium: 122 mmol/L — ABNORMAL LOW (ref 135–145)

## 2017-11-20 LAB — MAGNESIUM: Magnesium: 1.8 mg/dL (ref 1.7–2.4)

## 2017-11-20 MED ORDER — SODIUM CHLORIDE 0.9 % IV SOLN: 250.0000 mL | INTRAVENOUS | Status: DC | PRN

## 2017-11-20 MED ORDER — SODIUM CHLORIDE 0.9% FLUSH
3.0000 mL | Freq: Two times a day (BID) | INTRAVENOUS | Status: DC
  Administered 2017-11-20: 3 mL via INTRAVENOUS

## 2017-11-20 MED ORDER — PHYTONADIONE 5 MG PO TABS
2.5000 mg | ORAL_TABLET | Freq: Once | ORAL | Status: AC
  Administered 2017-11-20: 2.5 mg via ORAL
  Filled 2017-11-20 (×2): qty 1

## 2017-11-20 MED ORDER — FUROSEMIDE 10 MG/ML IJ SOLN
60.0000 mg | Freq: Two times a day (BID) | INTRAMUSCULAR | Status: DC
  Administered 2017-11-20 – 2017-11-21 (×2): 60 mg via INTRAVENOUS
  Filled 2017-11-20 (×2): qty 6

## 2017-11-20 MED ORDER — SODIUM CHLORIDE 0.9% FLUSH: 3.0000 mL | INTRAVENOUS | Status: DC | PRN

## 2017-11-20 MED ORDER — METOPROLOL SUCCINATE ER 50 MG PO TB24
50.0000 mg | ORAL_TABLET | Freq: Every day | ORAL | Status: DC
  Administered 2017-11-20 – 2017-11-24 (×4): 50 mg via ORAL
  Filled 2017-11-20 (×5): qty 1

## 2017-11-20 MED ORDER — SODIUM CHLORIDE 0.9 % IV SOLN
INTRAVENOUS | Status: DC
  Administered 2017-11-21: 250 mL via INTRAVENOUS
  Administered 2017-11-21: 05:00:00 via INTRAVENOUS

## 2017-11-20 NOTE — Progress Notes (Signed)
Progress Note  Patient Name: Ryan Savage Date of Encounter: 11/20/2017  Primary Cardiologist: Radford Pax  Subjective   Breathing is slightly more labored this morning. Laying in bed.   Inpatient Medications    Scheduled Meds: . aspirin EC  81 mg Oral Daily  . atorvastatin  20 mg Oral q1800  . docusate sodium  200 mg Oral QHS  . finasteride  5 mg Oral Daily  . furosemide  40 mg Intravenous Q12H  . metoprolol succinate  50 mg Oral Daily  . phytonadione  2.5 mg Oral Once  . sodium chloride flush  3 mL Intravenous Q12H  . sodium chloride flush  3 mL Intravenous Q12H   Continuous Infusions: . sodium chloride    . sodium chloride    . sodium chloride 10 mL/hr at 11/20/17 0549   PRN Meds: sodium chloride, sodium chloride, acetaminophen, alum & mag hydroxide-simeth, guaiFENesin-dextromethorphan, lip balm, loratadine, MUSCLE RUB, ondansetron (ZOFRAN) IV, phenol, polyvinyl alcohol, sodium chloride, sodium chloride flush, sodium chloride flush   Vital Signs    Vitals:   11/19/17 2100 11/19/17 2309 11/20/17 0347 11/20/17 0806  BP: 115/67 115/78 114/63 116/66  Pulse: 71 98 (!) 109 (!) 106  Resp: 18 (!) 32 (!) 28 (!) 24  Temp:  (!) 97.4 F (36.3 C) (!) 97.5 F (36.4 C) (!) 97.4 F (36.3 C)  TempSrc:  Oral Oral Oral  SpO2: 90% 91% 90% 92%  Weight:   191 lb 3.2 oz (86.7 kg)   Height:        Intake/Output Summary (Last 24 hours) at 11/20/2017 0919 Last data filed at 11/20/2017 0804 Gross per 24 hour  Intake 150 ml  Output 940 ml  Net -790 ml   Filed Weights   11/18/17 1840 11/19/17 0327 11/20/17 0347  Weight: 190 lb 11.2 oz (86.5 kg) 190 lb 11.2 oz (86.5 kg) 191 lb 3.2 oz (86.7 kg)    Telemetry    AFIB Rate 90-120s - Personally Reviewed  Physical Exam   General: Pleasant older W male appearing in no acute distress. Head: Normocephalic, atraumatic.  Neck: Supple, mild JVD. Lungs:  Resp regular and unlabored, CTA. Heart: Irreg Irreg, soft systolic murmur; no  rub. Abdomen: Soft, non-tender, non-distended with normoactive bowel sounds.  Extremities: No clubbing, cyanosis, edema. Distal pedal pulses are 2+ bilaterally. Neuro: Alert and oriented X 3. Moves all extremities spontaneously. Psych: Normal affect.  Labs    Chemistry Recent Labs  Lab 11/19/17 0232 11/19/17 0556 11/19/17 1758 11/20/17 0221  NA 119* 118* 119* 119*  K 4.3 4.3  --  4.1  CL 84* 84*  --  83*  CO2 21* 24  --  26  GLUCOSE 146* 163*  --  133*  BUN 23* 24*  --  24*  CREATININE 0.97 1.00  --  0.89  CALCIUM 8.3* 8.1*  --  7.9*  GFRNONAA >60 >60  --  >60  GFRAA >60 >60  --  >60  ANIONGAP 14 10  --  10     Hematology Recent Labs  Lab 11/18/17 0614 11/18/17 2350 11/19/17 0556  WBC 14.0* 12.1* 15.5*  RBC 4.38 3.95* 3.91*  HGB 13.5 12.3* 11.7*  HCT 38.8* 34.7* 34.3*  MCV 88.6 87.8 87.7  MCH 30.8 31.1 29.9  MCHC 34.8 35.4 34.1  RDW 12.9 12.9 12.9  PLT 344 324 336    Cardiac Enzymes Recent Labs  Lab 11/18/17 1537 11/18/17 2350 11/19/17 0232  TROPONINI 0.09* 0.08* 0.09*    Recent  Labs  Lab 11/18/17 0626 11/18/17 1017  TROPIPOC 0.08 0.09*     BNP Recent Labs  Lab 11/18/17 0855  BNP 1,421.3*     DDimer No results for input(s): DDIMER in the last 168 hours.    Radiology    No results found.  Cardiac Studies   TTE: 11/19/16  Study Conclusions  - Left ventricle: Septal and apical akinesis Inferior wall hypokinesis. The estimated ejection fraction was 35%. The study is not technically sufficient to allow evaluation of LV diastolic function. - Aortic valve: There was mild regurgitation. - Mitral valve: There was mild to moderate regurgitation. - Left atrium: The atrium was moderately dilated. - Right atrium: The atrium was severely dilated. - Atrial septum: No defect or patent foramen ovale was identified.  Patient Profile     84 y.o. male PMH ofposterior MV leaflet prolapse withmoderate MR, HTN, and chronic Afib who  presented with ongoing cough and shortness of breath. Found to have EF of 30%.   Assessment & Plan    1. Acute systolic HF: Presented with volume overload. UOP 940cc yesterday, weight up 1Lb today. Breathing maybe alittle worse this morning, but rate is up this morning. Planned for [facility] today pending repeat INR. Will redraw around noon. Watch respiratory status.  -- continue IV diuresis  2. Afib RVR: Known chronic Afib, on coumadin for [place]. Rate was elevated on admission, was started on Dilt drip with rate improvement. INR >10 on admission, given vitamin K. Now 2.2 today.  -- Dilt stopped yesterday and placed on Toprol XL 25mg , will titrate up as rates are elevated.  3. MV prolapse with moderate GN:FAOZH on echo from 12/17. Echo this admission showed mild to moderate regurg.   4. HTN: Stable with current therapy. Adjustments as above.   5. HL: on statin  6. Hyponatremia: Na+ 119 today, management per primary.  Signed, Reino Bellis, NP  11/20/2017, 9:19 AM  Pager # [phone]   For questions or updates, please contact [place] Please consult [email or web address] for contact info under Cardiology/STEMI.   Patient seen, examined. Available data reviewed. Agree with findings, assessment, and plan as outlined by Reino Bellis, NP.  On exam, the patient is sitting upright at the bedside.  He is mildly dyspneic but able to speak comfortably with oxygen per nasal cannula.  JVP is mildly elevated.  Lung fields are diminished in both bases right worse than left, heart is irregularly irregular with no murmur.  There is no pretibial edema on today's exam.  I have reviewed the notes as above and agree with continuing IV diuresis as the patient remains modestly volume overloaded.  Agree with increasing his beta-blocker for better heart rate control.  His INR is 2.2 and considering his continued volume overload, I would favor pushing his heart catheter tomorrow to allow for his INR to trend down  further and improvement in his breathing status before he has to lay flat for this procedure.  Explained this to the patient and his wife and they are agreeable.  Will order a diet for today and make him n.p.o. after midnight for the procedure tomorrow.  Sherren Mocha, M.D. 11/20/2017 11:41 AM

## 2017-11-20 NOTE — Progress Notes (Signed)
PROGRESS NOTE    Ryan Savage  ZTI:[id number] DOB: 1929-09-15 DOA: 11/18/2017 PCP: Lajean Manes, MD   Brief Narrative:  84 y.o. 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  He reports having gone 3 weeks with no sleep.  Poor PO intake, no appetite.  He developed congestion 2-3 days ago with significant worsening of his SOB.  SOB is primarily with exertion.  Cough is a deep cough, very little phlegm that is dark, brown, or white; 1 time with a trace of blood.  No chest pain.  No confusion.  Weakness has led to inability to do his usual activities, but prior to 3 weeks ago he was in [employer] and walking in the park.  Now, he can't even take the trash can to the street.  No fever.  No sick contacts.   ED Course:  Acute new CHF exacerbation and hyponatremia.  Chronic afib on Coumadin.  SOB that worsened last night, malaise and unable to exercise, cough x 3 weeks.  Afib with RVR 130-140 with hypoxia (4L  O2).  CXR with pulmonary edema.  BNP 1400.  Given Lasix, breathing treatments, Cardizem drip.    Subjective: 1/10 A/O 4, negative CP, negative SOB, negative abdominal pain, negative N/V. Sitting on the edge of the bed eating his meal.  Assessment & Plan:   Principal Problem:   Acute exacerbation of CHF (congestive heart failure) (HCC) Active Problems:   Permanent atrial fibrillation (HCC)   Benign essential HTN   Mitral valve prolapse   Hyponatremia with excess extracellular fluid volume   Hyperglycemia   Supratherapeutic INR   Acute Systolic CHF/cardiomyopathy -negative prior history of CHF: CXR C/W pulmonary edemawith elevated BNP -strict I&O since admission -572ml  -Daily weight Filed Weights   11/18/17 1840 11/19/17 0327 11/20/17 0347  Weight: 190 lb 11.2 oz (86.5 kg) 190 lb 11.2 oz (86.5 kg) 191 lb 3.2 oz (86.7 kg)  -Lasix IV 60 mg BID -Toprol 50 mg  daily -Cardiology will perform cardiac catheterization in a.m. if INR<1.8   Chronic Atrial fibrillation(CHA2DS2-VASc Score is >2)/Supratherapeutic INR -Coumadin on hold secondary to being supratherapeutic and patient requires cardiac catheterization -Currently in NSR Recent Labs  Lab 11/18/17 1105 11/18/17 1548 11/18/17 2354 11/19/17 1154 11/19/17 1758 11/20/17 0221  INR >10.00* >10.00* 9.83* 3.06 2.51 2.20  -patient treated with vitamin K PO 5 mg1 dose -1/10 repeat vitamin K  PO 2.5 mg: INR still above goal of 1.8 in order to allow cardiac catheterization.    Mitral valve prolapse -Cardiac catheterization planned for 1/11   Essential HTN  -See CHF  Severe Hyponatremia -Multifactorial hypervolemic secondary to CHF, SIADH (not observing fluid restriction)? -patient asymptomatic therefore current level of sodium must have been arrived at gradually: No history of altered mental status or seizure per patient and wife. -See SIADH and CHF  SIADH -Patient with history of SIADH may be exacerbating CHF findings of hyponatremia fluid restrict to 1054ml/dy -Serum sodium TID -do not correct> 8 mEq/L/day to avoid Central Pontine Myelinolysis.   Hyperglycemia -May be stress response -Will follow with fasting AM labs -It is unlikely that he will need acute or chronic treatment for this issue    DVT prophylaxis: currently INR therapeutic. SCD when INR< 2 Code Status: full Family Communication: wife at bedside for discussion of plan care Disposition Plan: TBD   Consultants:  Cardiology  Procedures/Significant Events:  1/9 Echocardiogram:Left ventricle: -Septal and apical akinesis Inferior wall   hypokinesis. -LVEF=35%. -study not technically sufficient to allow evaluation of LV diastolic   function. - Mitral valve: mild to moderate regurgitation. - Left atrium: moderately dilated.-Right atrium:severely dilated.    RIGHT/LEFT cardiac catheterization pending   I have  personally reviewed and interpreted all radiology studies and my findings are as above.  VENTILATOR SETTINGS:    Cultures   Antimicrobials:    Devices    LINES / TUBES:      Continuous Infusions: . sodium chloride    . sodium chloride    . sodium chloride 10 mL/hr at 11/20/17 0549     Objective: Vitals:   11/19/17 2100 11/19/17 2309 11/20/17 0347 11/20/17 0806  BP: 115/67 115/78 114/63 116/66  Pulse: 71 98 (!) 109 (!) 106  Resp: 18 (!) 32 (!) 28 (!) 24  Temp:  (!) 97.4 F (36.3 C) (!) 97.5 F (36.4 C) (!) 97.4 F (36.3 C)  TempSrc:  Oral Oral Oral  SpO2: 90% 91% 90% 92%  Weight:   191 lb 3.2 oz (86.7 kg)   Height:        Intake/Output Summary (Last 24 hours) at 11/20/2017 9629 Last data filed at 11/20/2017 5284 Gross per 24 hour  Intake 150 ml  Output 940 ml  Net -790 ml   Filed Weights   11/18/17 1840 11/19/17 0327 11/20/17 0347  Weight: 190 lb 11.2 oz (86.5 kg) 190 lb 11.2 oz (86.5 kg) 191 lb 3.2 oz (86.7 kg)    Physical Exam:  General: A/O 4, No acute respiratory distress Neck:  Negative scars, masses, torticollis, lymphadenopathy, JVD Lungs: Clear to auscultation bilaterally without wheezes or crackles Cardiovascular: Regular rate and rhythm without murmur gallop or rub normal S1 and S2 Abdomen: negative abdominal pain, nondistended, positive soft, bowel sounds, no rebound, no ascites, no appreciable mass Extremities: No significant cyanosis, clubbing, or edema bilateral lower extremities Skin: Negative rashes, lesions, ulcers Psychiatric:  Negative depression, negative anxiety, negative fatigue, negative mania  Central nervous system:  Cranial nerves II through XII intact, tongue/uvula midline, all extremities muscle strength 5/5, sensation intact throughout, negative dysarthria, negative expressive aphasia, negative receptive aphasia.       Data Reviewed: Care during the described time interval was provided by me .  I have reviewed this  patient's available data, including medical history, events of note, physical examination, and all test results as part of my evaluation.   CBC: Recent Labs  Lab 11/18/17 0614 11/18/17 2350 11/19/17 0556  WBC 14.0* 12.1* 15.5*  NEUTROABS  --  10.3*  --   HGB 13.5 12.3* 11.7*  HCT 38.8* 34.7* 34.3*  MCV 88.6 87.8 87.7  PLT 344 324 132   Basic Metabolic Panel: Recent Labs  Lab 11/18/17 1739 11/18/17 2350 11/19/17 0232 11/19/17 0556 11/19/17 1758 11/20/17 0221  NA 119* 118* 119* 118* 119* 119*  K 4.5 4.2 4.3 4.3  --  4.1  CL 86* 84* 84* 84*  --  83*  CO2 21* 22 21* 24  --  26  GLUCOSE 184* 150* 146* 163*  --  133*  BUN 20 23* 23* 24*  --  24*  CREATININE 1.13 1.01 0.97 1.00  --  0.89  CALCIUM 8.4* 8.1* 8.3* 8.1*  --  7.9*  MG  --   --   --   --   --  1.8   GFR: Estimated Creatinine Clearance: 61.5 mL/min (by C-G formula  based on SCr of 0.89 mg/dL). Liver Function Tests: No results for input(s): AST, ALT, ALKPHOS, BILITOT, PROT, ALBUMIN in the last 168 hours. No results for input(s): LIPASE, AMYLASE in the last 168 hours. No results for input(s): AMMONIA in the last 168 hours. Coagulation Profile: Recent Labs  Lab 11/18/17 1548 11/18/17 2354 11/19/17 1154 11/19/17 1758 11/20/17 0221  INR >10.00* 9.83* 3.06 2.51 2.20   Cardiac Enzymes: Recent Labs  Lab 11/18/17 1537 11/18/17 2350 11/19/17 0232  TROPONINI 0.09* 0.08* 0.09*   BNP (last 3 results) No results for input(s): PROBNP in the last 8760 hours. HbA1C: No results for input(s): HGBA1C in the last 72 hours. CBG: No results for input(s): GLUCAP in the last 168 hours. Lipid Profile: Recent Labs    11/19/17 0232  CHOL 74  HDL 27*  LDLCALC 35  TRIG 58  CHOLHDL 2.7   Thyroid Function Tests: Recent Labs    11/18/17 0855  TSH 0.574  FREET4 1.91*   Anemia Panel: No results for input(s): VITAMINB12, FOLATE, FERRITIN, TIBC, IRON, RETICCTPCT in the last 72 hours. Urine analysis:    Component  Value Date/Time   COLORURINE YELLOW 11/18/2017 0830   APPEARANCEUR CLEAR 11/18/2017 0830   LABSPEC 1.008 11/18/2017 0830   PHURINE 6.0 11/18/2017 0830   GLUCOSEU NEGATIVE 11/18/2017 0830   HGBUR MODERATE (A) 11/18/2017 0830   BILIRUBINUR NEGATIVE 11/18/2017 0830   KETONESUR 5 (A) 11/18/2017 0830   PROTEINUR NEGATIVE 11/18/2017 0830   NITRITE NEGATIVE 11/18/2017 0830   LEUKOCYTESUR NEGATIVE 11/18/2017 0830   Sepsis Labs: @LABRCNTIP (procalcitonin:4,lacticidven:4)  ) Recent Results (from the past 240 hour(s))  Respiratory Panel by PCR     Status: None   Collection Time: 11/18/17  8:55 AM  Result Value Ref Range Status   Adenovirus NOT DETECTED NOT DETECTED Final   Coronavirus 229E NOT DETECTED NOT DETECTED Final   Coronavirus HKU1 NOT DETECTED NOT DETECTED Final   Coronavirus NL63 NOT DETECTED NOT DETECTED Final   Coronavirus OC43 NOT DETECTED NOT DETECTED Final   Metapneumovirus NOT DETECTED NOT DETECTED Final   Rhinovirus / Enterovirus NOT DETECTED NOT DETECTED Final   Influenza A NOT DETECTED NOT DETECTED Final   Influenza B NOT DETECTED NOT DETECTED Final   Parainfluenza Virus 1 NOT DETECTED NOT DETECTED Final   Parainfluenza Virus 2 NOT DETECTED NOT DETECTED Final   Parainfluenza Virus 3 NOT DETECTED NOT DETECTED Final   Parainfluenza Virus 4 NOT DETECTED NOT DETECTED Final   Respiratory Syncytial Virus NOT DETECTED NOT DETECTED Final   Bordetella pertussis NOT DETECTED NOT DETECTED Final   Chlamydophila pneumoniae NOT DETECTED NOT DETECTED Final   Mycoplasma pneumoniae NOT DETECTED NOT DETECTED Final         Radiology Studies: No results found.      Scheduled Meds: . aspirin EC  81 mg Oral Daily  . atorvastatin  20 mg Oral q1800  . docusate sodium  200 mg Oral QHS  . finasteride  5 mg Oral Daily  . furosemide  40 mg Intravenous Q12H  . metoprolol succinate  25 mg Oral Daily  . phytonadione  2.5 mg Oral Once  . sodium chloride flush  3 mL Intravenous Q12H    . sodium chloride flush  3 mL Intravenous Q12H   Continuous Infusions: . sodium chloride    . sodium chloride    . sodium chloride 10 mL/hr at 11/20/17 0549     LOS: 2 days    Time spent: 40 minutes  Allie Bossier, MD [facility] Pager [phone]   If 7PM-7AM, please contact night-coverage [email or web address] Password [facility] 11/20/2017, 8:26 AM

## 2017-11-20 NOTE — Progress Notes (Signed)
ANTICOAGULATION CONSULT NOTE - Follow Up Consult  Pharmacy Consult for  Heparin when INR <2 Indication: atrial fibrillation  Allergies  Allergen Reactions  . Novocain [Procaine] Rash    Patient Measurements: Height: 5\' 10"  (177.8 cm)(stated) Weight: 191 lb 3.2 oz (86.7 kg) IBW/kg (Calculated) : 73 Heparin Dosing Weight: 86.7 kg  Vital Signs: Temp: 97.4 F (36.3 C) (01/10 1204) Temp Source: Oral (01/10 1204) BP: 116/61 (01/10 1204) Pulse Rate: 97 (01/10 1204)  Labs: Recent Labs    11/18/17 0614  11/18/17 1537  11/18/17 2350  11/19/17 0232 11/19/17 0556  11/19/17 1758 11/20/17 0221 11/20/17 1146  HGB 13.5  --   --   --  12.3*  --   --  11.7*  --   --   --   --   HCT 38.8*  --   --   --  34.7*  --   --  34.3*  --   --   --   --   PLT 344  --   --   --  324  --   --  336  --   --   --   --   LABPROT  --    < >  --    < >  --    < >  --   --    < > 26.9* 24.2* 22.9*  INR  --    < >  --    < >  --    < >  --   --    < > 2.51 2.20 2.05  CREATININE 0.97   < > 0.99   < > 1.01  --  0.97 1.00  --   --  0.89  --   TROPONINI  --   --  0.09*  --  0.08*  --  0.09*  --   --   --   --   --    < > = values in this interval not displayed.    Estimated Creatinine Clearance: 61.5 mL/min (by C-G formula based on SCr of 0.89 mg/dL).  Assessment:  54 yom presented to ED on 1/8 with SOB.  Hx of Afib on warfarin PTA, INR >10 in ED.    1/8:  INR >10, repeated to verify = 9.83.  Vitamin K 5 mg PO given   1/9:  INR down to 3.06, Vitamin K 2.5 mg given ~12:45am on 1/10   1/10: INR 2.2, Vitamin K 2.5 mg PO given ~10:30am.       INR 2.02 at 11:45am.  Cath planned for 1/11 afternoon.    Home Coumadin regimen: 4 mg, then 3.5 mg x 2 days, then repeat cycle. Last dose 1/7.  Goal of Therapy:  Heparin level 0.3-0.7 units/ml Monitor platelets by anticoagulation protocol: Yes   Plan:   Repeat PT/INR at 6pm tonight.  Begin Heparin drip if INR <2.  Coumadin on hold.  Daily PT/INR.  Cardiac  cath planned for 1/11 pm.  Claudie Fisherman Pager: [phone]  11/20/2017,1:08 PM

## 2017-11-21 ENCOUNTER — Encounter (HOSPITAL_COMMUNITY)
Admission: EM | Disposition: A | Discharge status: Home Health | Payer: Self-pay | Source: Home / Self Care | Attending: Internal Medicine

## 2017-11-21 DIAGNOSIS — I482 Chronic atrial fibrillation: Secondary | ICD-10-CM

## 2017-11-21 DIAGNOSIS — R946 Abnormal results of thyroid function studies: Secondary | ICD-10-CM

## 2017-11-21 HISTORY — PX: RIGHT/LEFT HEART CATH AND CORONARY ANGIOGRAPHY: CATH118266

## 2017-11-21 LAB — BASIC METABOLIC PANEL
Anion gap: 12 (ref 5–15)
BUN: 24 mg/dL — ABNORMAL HIGH (ref 6–20)
CO2: 29 mmol/L (ref 22–32)
Calcium: 7.8 mg/dL — ABNORMAL LOW (ref 8.9–10.3)
Chloride: 79 mmol/L — ABNORMAL LOW (ref 101–111)
Creatinine, Ser: 0.95 mg/dL (ref 0.61–1.24)
GFR calc Af Amer: >60 mL/min (ref >60)
GFR calc non Af Amer: >60 mL/min (ref >60)
Glucose, Bld: 127 mg/dL — ABNORMAL HIGH (ref 65–99)
Potassium: 3.8 mmol/L (ref 3.5–5.1)
Sodium: 120 mmol/L — ABNORMAL LOW (ref 135–145)

## 2017-11-21 LAB — PROTIME-INR
INR: 1.92
INR: 1.68
Prothrombin Time: 21.8 seconds — ABNORMAL HIGH (ref 11.4–15.2)
Prothrombin Time: 19.6 seconds — ABNORMAL HIGH (ref 11.4–15.2)

## 2017-11-21 LAB — SODIUM
Sodium: 121 mmol/L — ABNORMAL LOW (ref 135–145)
Sodium: 124 mmol/L — ABNORMAL LOW (ref 135–145)

## 2017-11-21 LAB — ABO/RH: ABO/RH(D): O POS

## 2017-11-21 LAB — POCT I-STAT 3, VENOUS BLOOD GAS (G3P V)
Acid-Base Excess: 4 mmol/L — ABNORMAL HIGH (ref 0.0–2.0)
Bicarbonate: 28.8 mmol/L — ABNORMAL HIGH (ref 20.0–28.0)
O2 Saturation: 56 %
TCO2: 30 mmol/L (ref 22–32)
pCO2, Ven: 43.2 mmHg — ABNORMAL LOW (ref 44.0–60.0)
pH, Ven: 7.432 — ABNORMAL HIGH (ref 7.250–7.430)
pO2, Ven: 29 mmHg — CL (ref 32.0–45.0)

## 2017-11-21 LAB — POCT I-STAT 3, ART BLOOD GAS (G3+)
Acid-Base Excess: 3 mmol/L — ABNORMAL HIGH (ref 0.0–2.0)
Bicarbonate: 26 mmol/L (ref 20.0–28.0)
O2 Saturation: 92 %
TCO2: 27 mmol/L (ref 22–32)
pCO2 arterial: 35.1 mmHg (ref 32.0–48.0)
pH, Arterial: 7.477 — ABNORMAL HIGH (ref 7.350–7.450)
pO2, Arterial: 59 mmHg — ABNORMAL LOW (ref 83.0–108.0)

## 2017-11-21 LAB — MAGNESIUM: Magnesium: 2 mg/dL (ref 1.7–2.4)

## 2017-11-21 LAB — TYPE AND SCREEN
ABO/RH(D): O POS
Antibody Screen: NEGATIVE

## 2017-11-21 LAB — HEPARIN LEVEL (UNFRACTIONATED): Heparin Unfractionated: 0.17 IU/mL — ABNORMAL LOW (ref 0.30–0.70)

## 2017-11-21 SURGERY — RIGHT/LEFT HEART CATH AND CORONARY ANGIOGRAPHY
Anesthesia: LOCAL

## 2017-11-21 MED ORDER — SODIUM CHLORIDE 0.9 % IV SOLN: 250.0000 mL | INTRAVENOUS | Status: DC | PRN

## 2017-11-21 MED ORDER — MIDAZOLAM HCL 2 MG/2ML IJ SOLN
INTRAMUSCULAR | Status: AC
  Filled 2017-11-21: qty 2

## 2017-11-21 MED ORDER — HEPARIN (PORCINE) IN NACL 2-0.9 UNIT/ML-% IJ SOLN
INTRAMUSCULAR | Status: AC | PRN
  Administered 2017-11-21: 1000 mL via INTRA_ARTERIAL

## 2017-11-21 MED ORDER — WARFARIN - PHARMACIST DOSING INPATIENT
Freq: Every day | Status: DC
  Administered 2017-11-22: 1

## 2017-11-21 MED ORDER — SODIUM CHLORIDE 0.9 % WEIGHT BASED INFUSION
1.0000 mL/kg/h | INTRAVENOUS | Status: AC
  Administered 2017-11-21: 1 mL/kg/h via INTRAVENOUS

## 2017-11-21 MED ORDER — VERAPAMIL HCL 2.5 MG/ML IV SOLN
INTRAVENOUS | Status: DC | PRN
  Administered 2017-11-21: 10 mL via INTRA_ARTERIAL

## 2017-11-21 MED ORDER — HEPARIN (PORCINE) IN NACL 2-0.9 UNIT/ML-% IJ SOLN
INTRAMUSCULAR | Status: AC
  Filled 2017-11-21: qty 1000

## 2017-11-21 MED ORDER — SODIUM CHLORIDE 0.9% FLUSH: 3.0000 mL | INTRAVENOUS | Status: DC | PRN

## 2017-11-21 MED ORDER — IOPAMIDOL (ISOVUE-370) INJECTION 76%
INTRAVENOUS | Status: DC | PRN
  Administered 2017-11-21: 55 mL via INTRA_ARTERIAL

## 2017-11-21 MED ORDER — FENTANYL CITRATE (PF) 100 MCG/2ML IJ SOLN
INTRAMUSCULAR | Status: DC | PRN
  Administered 2017-11-21: 25 ug via INTRAVENOUS

## 2017-11-21 MED ORDER — LIDOCAINE HCL (PF) 1 % IJ SOLN
INTRAMUSCULAR | Status: DC | PRN
  Administered 2017-11-21 (×2): 2 mL via INTRADERMAL

## 2017-11-21 MED ORDER — HEPARIN (PORCINE) IN NACL 100-0.45 UNIT/ML-% IJ SOLN
1300.0000 [IU]/h | INTRAMUSCULAR | Status: DC
  Administered 2017-11-22: 1000 [IU]/h via INTRAVENOUS
  Administered 2017-11-23: 1300 [IU]/h via INTRAVENOUS
  Filled 2017-11-21 (×2): qty 250

## 2017-11-21 MED ORDER — HEPARIN BOLUS VIA INFUSION
2500.0000 [IU] | Freq: Once | INTRAVENOUS | Status: AC
  Administered 2017-11-21: 2500 [IU] via INTRAVENOUS
  Filled 2017-11-21: qty 2500

## 2017-11-21 MED ORDER — LIDOCAINE HCL (PF) 1 % IJ SOLN
INTRAMUSCULAR | Status: AC
  Filled 2017-11-21: qty 30

## 2017-11-21 MED ORDER — IOPAMIDOL (ISOVUE-370) INJECTION 76%
INTRAVENOUS | Status: AC
  Filled 2017-11-21: qty 100

## 2017-11-21 MED ORDER — HEPARIN SODIUM (PORCINE) 1000 UNIT/ML IJ SOLN
INTRAMUSCULAR | Status: AC
  Filled 2017-11-21: qty 1

## 2017-11-21 MED ORDER — HEPARIN (PORCINE) IN NACL 100-0.45 UNIT/ML-% IJ SOLN
1350.0000 [IU]/h | INTRAMUSCULAR | Status: DC
  Administered 2017-11-21: 1100 [IU]/h via INTRAVENOUS
  Filled 2017-11-21: qty 250

## 2017-11-21 MED ORDER — SODIUM CHLORIDE 0.9 % IV SOLN: Freq: Once | INTRAVENOUS | Status: DC

## 2017-11-21 MED ORDER — HEPARIN SODIUM (PORCINE) 1000 UNIT/ML IJ SOLN
INTRAMUSCULAR | Status: DC | PRN
  Administered 2017-11-21: 4000 [IU] via INTRAVENOUS

## 2017-11-21 MED ORDER — MIDAZOLAM HCL 2 MG/2ML IJ SOLN
INTRAMUSCULAR | Status: DC | PRN
  Administered 2017-11-21: 1 mg via INTRAVENOUS

## 2017-11-21 MED ORDER — VERAPAMIL HCL 2.5 MG/ML IV SOLN
INTRAVENOUS | Status: AC
  Filled 2017-11-21: qty 2

## 2017-11-21 MED ORDER — FENTANYL CITRATE (PF) 100 MCG/2ML IJ SOLN
INTRAMUSCULAR | Status: AC
  Filled 2017-11-21: qty 2

## 2017-11-21 MED ORDER — WARFARIN SODIUM 4 MG PO TABS
4.0000 mg | ORAL_TABLET | Freq: Once | ORAL | Status: AC
  Administered 2017-11-21: 4 mg via ORAL
  Filled 2017-11-21: qty 1

## 2017-11-21 MED ORDER — SODIUM CHLORIDE 0.9% FLUSH
3.0000 mL | Freq: Two times a day (BID) | INTRAVENOUS | Status: DC
  Administered 2017-11-22 – 2017-11-23 (×3): 3 mL via INTRAVENOUS

## 2017-11-21 SURGICAL SUPPLY — 14 items
CATH 5FR JL3.5 JR4 ANG PIG MP (CATHETERS) ×2 IMPLANT
CATH BALLN WEDGE 5F 110CM (CATHETERS) ×2 IMPLANT
DEVICE RAD COMP TR BAND LRG (VASCULAR PRODUCTS) ×2 IMPLANT
GLIDESHEATH SLEND SS 6F .021 (SHEATH) ×2 IMPLANT
GUIDEWIRE .025 260CM (WIRE) ×2 IMPLANT
GUIDEWIRE INQWIRE 1.5J.035X260 (WIRE) ×1 IMPLANT
INQWIRE 1.5J .035X260CM (WIRE) ×2
KIT ESSENTIALS PG (KITS) ×2 IMPLANT
KIT HEART LEFT (KITS) ×2 IMPLANT
PACK CARDIAC CATHETERIZATION (CUSTOM PROCEDURE TRAY) ×2 IMPLANT
SHEATH GLIDE SLENDER 4/5FR (SHEATH) ×2 IMPLANT
TRANSDUCER W/STOPCOCK (MISCELLANEOUS) ×2 IMPLANT
TUBING CIL FLEX 10 FLL-RA (TUBING) ×2 IMPLANT
WIRE HI TORQ VERSACORE-J 145CM (WIRE) ×2 IMPLANT

## 2017-11-21 NOTE — Progress Notes (Signed)
PROGRESS NOTE    Ryan Savage  EAV:[id number] DOB: January 29, 1929 DOA: 11/18/2017 PCP: Lajean Manes, MD   Brief Narrative:  84 y.o. 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  He reports having gone 3 weeks with no sleep.  Poor PO intake, no appetite.  He developed congestion 2-3 days ago with significant worsening of his SOB.  SOB is primarily with exertion.  Cough is a deep cough, very little phlegm that is dark, brown, or white; 1 time with a trace of blood.  No chest pain.  No confusion.  Weakness has led to inability to do his usual activities, but prior to 3 weeks ago he was in [employer] and walking in the park.  Now, he can't even take the trash can to the street.  No fever.  No sick contacts.   ED Course:  Acute new CHF exacerbation and hyponatremia.  Chronic afib on Coumadin.  SOB that worsened last night, malaise and unable to exercise, cough x 3 weeks.  Afib with RVR 130-140 with hypoxia (4L [place] O2).  CXR with pulmonary edema.  BNP 1400.  Given Lasix, breathing treatments, Cardizem drip.    Subjective: 1/11   A/O 4, negative CP, negative SOB, negative abdominal pain, negative N/V. Sitting on the edge of the bed eating his meal.  Assessment & Plan:   Principal Problem:   Acute exacerbation of CHF (congestive heart failure) (HCC) Active Problems:   Permanent atrial fibrillation (HCC)   Benign essential HTN   Mitral valve prolapse   Hyponatremia with excess extracellular fluid volume   Hyperglycemia   Supratherapeutic INR   Acute on chronic systolic heart failure (HCC)   Acute Systolic CHF/cardiomyopathy -negative prior history of CHF: CXR C/W pulmonary edemawith elevated BNP -strict I&O since admission -5104ml  -Daily weight Filed Weights   11/19/17 0327 11/20/17 0347 11/21/17 0300  Weight: 190 lb 11.2 oz (86.5 kg) 191 lb 3.2 oz (86.7 kg) 185 lb 13.6 oz  (84.3 kg)  -Lasix IV 60 mg BID -Toprol 50 mg daily -Cardiology will perform cardiac catheterization in a.m. if INR<1.8   Chronic Atrial fibrillation(CHA2DS2-VASc Score is >2)/Supratherapeutic NWG@@@@ -Coumadin on hold secondary to being supratherapeutic and patient requires cardiac catheterization -Currently in NSR Recent Labs  Lab 11/19/17 1154 11/19/17 1758 11/20/17 0221 11/20/17 1146 11/20/17 1808 11/21/17 0125  INR 3.06 2.51 2.20 2.05 2.02 1.92  -patient treated with vitamin K PO 5 mg1 dose -1/10 repeat vitamin K  PO 2.5 mg: INR still above goal of 1.8 in order to allow cardiac catheterization. -1/11 INR still above goal set by cardiology for cardiac catheterization. 1/11 Transfuse 1 unit FFP and recheck INR 1200    Mitral valve prolapse -Cardiac catheterization planned for 1/11   Essential HTN  -See CHF  Severe Hyponatremia -Multifactorial hypervolemic secondary to CHF, SIADH (not observing fluid restriction)? -patient asymptomatic therefore current level of sodium must have been arrived at gradually: No history of altered mental status or seizure per patient and wife. -See SIADH and CHF  SIADH -Patient with history of SIADH may be exacerbating CHF findings of hyponatremia fluid restrict to 1034ml/dy -Serum sodium TID -do not correct> 8 mEq/L/day to avoid Central Pontine Myelinolysis.   Hyperglycemia -May be stress response -Will follow with fasting AM labs -It is unlikely that he will need acute or chronic treatment for this issue    DVT prophylaxis: currently  INR therapeutic. SCD when INR< 2 Code Status: full Family Communication: wife at bedside for discussion of plan care Disposition Plan: TBD   Consultants:  Cardiology     Procedures/Significant Events:  1/9 Echocardiogram:Left ventricle: -Septal and apical akinesis Inferior wall   hypokinesis. -LVEF=35%. -study not technically sufficient to allow evaluation of LV diastolic   function. - Mitral  valve: mild to moderate regurgitation. - Left atrium: moderately dilated.-Right atrium:severely dilated.    RIGHT/LEFT cardiac catheterization pending   I have personally reviewed and interpreted all radiology studies and my findings are as above.  VENTILATOR SETTINGS:    Cultures   Antimicrobials:    Devices    LINES / TUBES:      Continuous Infusions: . sodium chloride    . sodium chloride    . sodium chloride 10 mL/hr at 11/20/17 0549  . sodium chloride    . sodium chloride 10 mL/hr at 11/21/17 0520  . heparin 1,100 Units/hr (11/21/17 0519)     Objective: Vitals:   11/20/17 2300 11/20/17 2331 11/21/17 0300 11/21/17 0342  BP:  102/70  120/89  Pulse: 93 96  99  Resp:  20  20  Temp:  97.6 F (36.4 C)  (!) 97.4 F (36.3 C)  TempSrc:  Oral  Oral  SpO2: 93% (!) 89%  95%  Weight:   185 lb 13.6 oz (84.3 kg)   Height:   5\' 10"  (1.778 m)     Intake/Output Summary (Last 24 hours) at 11/21/2017 0719 Last data filed at 11/21/2017 0400 Gross per 24 hour  Intake 480 ml  Output 1150 ml  Net -670 ml   Filed Weights   11/19/17 0327 11/20/17 0347 11/21/17 0300  Weight: 190 lb 11.2 oz (86.5 kg) 191 lb 3.2 oz (86.7 kg) 185 lb 13.6 oz (84.3 kg)    Physical Exam:  General: A/O 4, No acute respiratory distress Neck:  Negative scars, masses, torticollis, lymphadenopathy, JVD Lungs: Clear to auscultation bilaterally without wheezes or crackles Cardiovascular: Regular rate and rhythm without murmur gallop or rub normal S1 and S2 Abdomen: negative abdominal pain, nondistended, positive soft, bowel sounds, no rebound, no ascites, no appreciable mass Extremities: No significant cyanosis, clubbing, or edema bilateral lower extremities Skin: Negative rashes, lesions, ulcers Psychiatric:  Negative depression, negative anxiety, negative fatigue, negative mania  Central nervous system:  Cranial nerves II through XII intact, tongue/uvula midline, all extremities muscle  strength 5/5, sensation intact throughout, negative dysarthria, negative expressive aphasia, negative receptive aphasia.       Data Reviewed: Care during the described time interval was provided by me .  I have reviewed this patient's available data, including medical history, events of note, physical examination, and all test results as part of my evaluation.   CBC: Recent Labs  Lab 11/18/17 0614 11/18/17 2350 11/19/17 0556  WBC 14.0* 12.1* 15.5*  NEUTROABS  --  10.3*  --   HGB 13.5 12.3* 11.7*  HCT 38.8* 34.7* 34.3*  MCV 88.6 87.8 87.7  PLT 344 324 811   Basic Metabolic Panel: Recent Labs  Lab 11/18/17 2350 11/19/17 0232 11/19/17 0556 11/19/17 1758 11/20/17 0221 11/20/17 1146 11/20/17 1808 11/21/17 0125  NA 118* 119* 118* 119* 119* 122* 122* 120*  K 4.2 4.3 4.3  --  4.1  --   --  3.8  CL 84* 84* 84*  --  83*  --   --  79*  CO2 22 21* 24  --  26  --   --  29  GLUCOSE 150* 146* 163*  --  133*  --   --  127*  BUN 23* 23* 24*  --  24*  --   --  24*  CREATININE 1.01 0.97 1.00  --  0.89  --   --  0.95  CALCIUM 8.1* 8.3* 8.1*  --  7.9*  --   --  7.8*  MG  --   --   --   --  1.8  --   --  2.0   GFR: Estimated Creatinine Clearance: 57.6 mL/min (by C-G formula based on SCr of 0.95 mg/dL). Liver Function Tests: No results for input(s): AST, ALT, ALKPHOS, BILITOT, PROT, ALBUMIN in the last 168 hours. No results for input(s): LIPASE, AMYLASE in the last 168 hours. No results for input(s): AMMONIA in the last 168 hours. Coagulation Profile: Recent Labs  Lab 11/19/17 1758 11/20/17 0221 11/20/17 1146 11/20/17 1808 11/21/17 0125  INR 2.51 2.20 2.05 2.02 1.92   Cardiac Enzymes: Recent Labs  Lab 11/18/17 1537 11/18/17 2350 11/19/17 0232  TROPONINI 0.09* 0.08* 0.09*   BNP (last 3 results) No results for input(s): PROBNP in the last 8760 hours. HbA1C: No results for input(s): HGBA1C in the last 72 hours. CBG: No results for input(s): GLUCAP in the last 168  hours. Lipid Profile: Recent Labs    11/19/17 0232  CHOL 74  HDL 27*  LDLCALC 35  TRIG 58  CHOLHDL 2.7   Thyroid Function Tests: Recent Labs    11/18/17 0855  TSH 0.574  FREET4 1.91*   Anemia Panel: No results for input(s): VITAMINB12, FOLATE, FERRITIN, TIBC, IRON, RETICCTPCT in the last 72 hours. Urine analysis:    Component Value Date/Time   COLORURINE YELLOW 11/18/2017 0830   APPEARANCEUR CLEAR 11/18/2017 0830   LABSPEC 1.008 11/18/2017 0830   PHURINE 6.0 11/18/2017 0830   GLUCOSEU NEGATIVE 11/18/2017 0830   HGBUR MODERATE (A) 11/18/2017 0830   BILIRUBINUR NEGATIVE 11/18/2017 0830   KETONESUR 5 (A) 11/18/2017 0830   PROTEINUR NEGATIVE 11/18/2017 0830   NITRITE NEGATIVE 11/18/2017 0830   LEUKOCYTESUR NEGATIVE 11/18/2017 0830   Sepsis Labs: @LABRCNTIP (procalcitonin:4,lacticidven:4)  ) Recent Results (from the past 240 hour(s))  Respiratory Panel by PCR     Status: None   Collection Time: 11/18/17  8:55 AM  Result Value Ref Range Status   Adenovirus NOT DETECTED NOT DETECTED Final   Coronavirus 229E NOT DETECTED NOT DETECTED Final   Coronavirus HKU1 NOT DETECTED NOT DETECTED Final   Coronavirus NL63 NOT DETECTED NOT DETECTED Final   Coronavirus OC43 NOT DETECTED NOT DETECTED Final   Metapneumovirus NOT DETECTED NOT DETECTED Final   Rhinovirus / Enterovirus NOT DETECTED NOT DETECTED Final   Influenza A NOT DETECTED NOT DETECTED Final   Influenza B NOT DETECTED NOT DETECTED Final   Parainfluenza Virus 1 NOT DETECTED NOT DETECTED Final   Parainfluenza Virus 2 NOT DETECTED NOT DETECTED Final   Parainfluenza Virus 3 NOT DETECTED NOT DETECTED Final   Parainfluenza Virus 4 NOT DETECTED NOT DETECTED Final   Respiratory Syncytial Virus NOT DETECTED NOT DETECTED Final   Bordetella pertussis NOT DETECTED NOT DETECTED Final   Chlamydophila pneumoniae NOT DETECTED NOT DETECTED Final   Mycoplasma pneumoniae NOT DETECTED NOT DETECTED Final         Radiology  Studies: No results found.      Scheduled Meds: . aspirin EC  81 mg Oral Daily  . atorvastatin  20 mg Oral q1800  . docusate sodium  200  mg Oral QHS  . finasteride  5 mg Oral Daily  . furosemide  60 mg Intravenous Q12H  . metoprolol succinate  50 mg Oral Daily  . sodium chloride flush  3 mL Intravenous Q12H  . sodium chloride flush  3 mL Intravenous Q12H  . sodium chloride flush  3 mL Intravenous Q12H   Continuous Infusions: . sodium chloride    . sodium chloride    . sodium chloride 10 mL/hr at 11/20/17 0549  . sodium chloride    . sodium chloride 10 mL/hr at 11/21/17 0520  . heparin 1,100 Units/hr (11/21/17 0519)     LOS: 3 days    Time spent: 40 minutes    Klaire Court, Geraldo Docker, MD [facility] Pager [phone]   If 7PM-7AM, please contact night-coverage [email or web address] Password [facility] 11/21/2017, 7:19 AM

## 2017-11-21 NOTE — Progress Notes (Signed)
Progress Note  Patient Name: Ryan Savage Date of Encounter: 11/21/2017  Primary Cardiologist: [name]  Subjective   Breathing feels fine, just generally weak. Anxious about procedure. No recent CP. Until recently he was walking regularly. Repeat BP while I was in the room was 110/70. No orthopnea. He thinks he can lie flat for procedure.  Inpatient Medications    Scheduled Meds: . aspirin EC  81 mg Oral Daily  . atorvastatin  20 mg Oral q1800  . docusate sodium  200 mg Oral QHS  . finasteride  5 mg Oral Daily  . furosemide  60 mg Intravenous Q12H  . metoprolol succinate  50 mg Oral Daily  . sodium chloride flush  3 mL Intravenous Q12H  . sodium chloride flush  3 mL Intravenous Q12H  . sodium chloride flush  3 mL Intravenous Q12H   Continuous Infusions: . sodium chloride    . sodium chloride    . sodium chloride 10 mL/hr at 11/21/17 0800  . sodium chloride    . sodium chloride 10 mL/hr at 11/21/17 0800  . sodium chloride    . heparin 1,100 Units/hr (11/21/17 0800)   PRN Meds: sodium chloride, sodium chloride, sodium chloride, acetaminophen, alum & mag hydroxide-simeth, guaiFENesin-dextromethorphan, lip balm, loratadine, MUSCLE RUB, ondansetron (ZOFRAN) IV, phenol, polyvinyl alcohol, sodium chloride, sodium chloride flush, sodium chloride flush, sodium chloride flush   Vital Signs    Vitals:   11/21/17 0300 11/21/17 0342 11/21/17 0700 11/21/17 0817  BP:  120/89  93/80  Pulse:  99  72  Resp:  20 18 (!) 22  Temp:  (!) 97.4 F (36.3 C)  98.2 F (36.8 C)  TempSrc:  Oral  Oral  SpO2:  95%  96%  Weight: 185 lb 13.6 oz (84.3 kg)     Height: 5\' 10"  (1.778 m)       Intake/Output Summary (Last 24 hours) at 11/21/2017 1016 Last data filed at 11/21/2017 0800 Gross per 24 hour  Intake 798.02 ml  Output 1375 ml  Net -576.98 ml   Filed Weights   11/19/17 0327 11/20/17 0347 11/21/17 0300  Weight: 190 lb 11.2 oz (86.5 kg) 191 lb 3.2 oz (86.7 kg) 185 lb 13.6 oz (84.3  kg)    Telemetry    Atrial fib rates 95-120s, occ PVCs - Personally Reviewed  Physical Exam   GEN: No acute distress, elderly WM HEENT: Normocephalic, atraumatic, sclera non-icteric. Neck: No JVD or bruits. Cardiac: Irregularly irregular, mildly tachycardic, no murmurs, rubs, or gallops.  Radials/DP/PT 1+ and equal bilaterally.  Respiratory: Clear to auscultation bilaterally. Breathing is unlabored. GI: Soft, nontender, non-distended, BS +x 4. MS: no deformity. Extremities: No clubbing or cyanosis. No edema. Distal pedal pulses are 2+ and equal bilaterally. Neuro:  AAOx3. Follows commands. Psych:  Responds to questions appropriately with a normal affect.  Labs    Chemistry Recent Labs  Lab 11/19/17 0556  11/20/17 0221  11/20/17 1808 11/21/17 0125 11/21/17 0828  NA 118*   < > 119*   < > 122* 120* 121*  K 4.3  --  4.1  --   --  3.8  --   CL 84*  --  83*  --   --  79*  --   CO2 24  --  26  --   --  29  --   GLUCOSE 163*  --  133*  --   --  127*  --   BUN 24*  --  24*  --   --  24*  --   CREATININE 1.00  --  0.89  --   --  0.95  --   CALCIUM 8.1*  --  7.9*  --   --  7.8*  --   GFRNONAA >60  --  >60  --   --  >60  --   GFRAA >60  --  >60  --   --  >60  --   ANIONGAP 10  --  10  --   --  12  --    < > = values in this interval not displayed.     Hematology Recent Labs  Lab 11/18/17 0614 11/18/17 2350 11/19/17 0556  WBC 14.0* 12.1* 15.5*  RBC 4.38 3.95* 3.91*  HGB 13.5 12.3* 11.7*  HCT 38.8* 34.7* 34.3*  MCV 88.6 87.8 87.7  MCH 30.8 31.1 29.9  MCHC 34.8 35.4 34.1  RDW 12.9 12.9 12.9  PLT 344 324 336    Cardiac Enzymes Recent Labs  Lab 11/18/17 1537 11/18/17 2350 11/19/17 0232  TROPONINI 0.09* 0.08* 0.09*    Recent Labs  Lab 11/18/17 0626 11/18/17 1017  TROPIPOC 0.08 0.09*     BNP Recent Labs  Lab 11/18/17 0855  BNP 1,421.3*     DDimer No results for input(s): DDIMER in the last 168 hours.   Radiology    No results found.  Cardiac  Studies   TTE: 11/19/16  Study Conclusions  - Left ventricle: Septal and apical akinesis Inferior wall hypokinesis. The estimated ejection fraction was 35%. The study is not technically sufficient to allow evaluation of LV diastolic function. - Aortic valve: There was mild regurgitation. - Mitral valve: There was mild to moderate regurgitation. - Left atrium: The atrium was moderately dilated. - Right atrium: The atrium was severely dilated. - Atrial septum: No defect or patent foramen ovale was identified.    Patient Profile     84 y.o. 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 Found to have EF of 35%.    Assessment & Plan    1. AcutesystolicHF/cardiomyopathy: Weight is down 5lb from admission. He reports excellent UOP overnight. SBP a little soft this AM in the 90s. He already received AM dose. Will hold next dosing in anticipation of probable cath today. IM administered 1 unit FFP with repeat INR pending for noon. Cath is not scheduled until 3 tentatively. BP too low for ACEI/ARB.  2.Chronic atrial fib RVR: Known chronic Afib, on coumadin for [place]. Rate was elevated on admission, INR >10 on admission, given vitamin K. -- Dilt stopped yesterday given low EF and placed on Toprol XL 25mg . Consider further titration of Toprol to 75mg  daily although blood pressure may not allow for this. Digoxin is an option.  3. MV prolapse with moderate YN:WGNFA on echo from 12/17 -> was supposed to be pending TEE but patient was reluctant.Echo this admission showed mild to moderate regurg.  4. HTN: BP softer this AM; see above.  5. HL: continue statin.  6. Hyponatremia/chronic SIADH: Na+ 118-120 this admission, management per primary.  7. Abnormal thyroid function: TSH low-normal, elevated FT4 consistent with subclinical hyperthyroidism which could  be driving HR (increased risk of AF with subclinical hypothyroidism). Further per IM.  For questions or updates, please contact [place] Please consult [email or web address] for contact info under Cardiology/STEMI.  Signed, Charlie Pitter, PA-C 11/21/2017, 10:16 AM    Patient seen, examined.  Available data reviewed. Agree with findings, assessment, and plan as outlined by Melina Copa, PA-C.   Patient is alert and oriented in no distress.  JVP is normal.  Lung fields are clear.  Heart is irregularly irregular with a 2/6 holosystolic murmur at the apex.  Abdomen is soft and nontender.  There is no pretibial edema.  Discussed plans for proceeding with right and left heart catheterization today.  His INR was 1.9 this morning.  He received FFP with plans for a follow-up INR at noon today.  I suspect he will be okay to proceed with diagnostic catheterization from a radial approach.  We have reviewed risks and indications extensively.  He is eager to have his catheterization done.  I agree with the medication program he is currently taking and plans as outlined above.  We will follow-up after his heart cath procedure.  Sherren Mocha, M.D. 11/21/2017 11:17 AM

## 2017-11-21 NOTE — H&P (View-Only) (Signed)
Progress Note  Patient Name: Ryan Savage Date of Encounter: 11/21/2017  Primary Cardiologist: [name]  Subjective   Breathing feels fine, just generally weak. Anxious about procedure. No recent CP. Until recently he was walking regularly. Repeat BP while I was in the room was 110/70. No orthopnea. He thinks he can lie flat for procedure.  Inpatient Medications    Scheduled Meds: . aspirin EC  81 mg Oral Daily  . atorvastatin  20 mg Oral q1800  . docusate sodium  200 mg Oral QHS  . finasteride  5 mg Oral Daily  . furosemide  60 mg Intravenous Q12H  . metoprolol succinate  50 mg Oral Daily  . sodium chloride flush  3 mL Intravenous Q12H  . sodium chloride flush  3 mL Intravenous Q12H  . sodium chloride flush  3 mL Intravenous Q12H   Continuous Infusions: . sodium chloride    . sodium chloride    . sodium chloride 10 mL/hr at 11/21/17 0800  . sodium chloride    . sodium chloride 10 mL/hr at 11/21/17 0800  . sodium chloride    . heparin 1,100 Units/hr (11/21/17 0800)   PRN Meds: sodium chloride, sodium chloride, sodium chloride, acetaminophen, alum & mag hydroxide-simeth, guaiFENesin-dextromethorphan, lip balm, loratadine, MUSCLE RUB, ondansetron (ZOFRAN) IV, phenol, polyvinyl alcohol, sodium chloride, sodium chloride flush, sodium chloride flush, sodium chloride flush   Vital Signs    Vitals:   11/21/17 0300 11/21/17 0342 11/21/17 0700 11/21/17 0817  BP:  120/89  93/80  Pulse:  99  72  Resp:  20 18 (!) 22  Temp:  (!) 97.4 F (36.3 C)  98.2 F (36.8 C)  TempSrc:  Oral  Oral  SpO2:  95%  96%  Weight: 185 lb 13.6 oz (84.3 kg)     Height: 5\' 10"  (1.778 m)       Intake/Output Summary (Last 24 hours) at 11/21/2017 1016 Last data filed at 11/21/2017 0800 Gross per 24 hour  Intake 798.02 ml  Output 1375 ml  Net -576.98 ml   Filed Weights   11/19/17 0327 11/20/17 0347 11/21/17 0300  Weight: 190 lb 11.2 oz (86.5 kg) 191 lb 3.2 oz (86.7 kg) 185 lb 13.6 oz (84.3  kg)    Telemetry    Atrial fib rates 95-120s, occ PVCs - Personally Reviewed  Physical Exam   GEN: No acute distress, elderly WM HEENT: Normocephalic, atraumatic, sclera non-icteric. Neck: No JVD or bruits. Cardiac: Irregularly irregular, mildly tachycardic, no murmurs, rubs, or gallops.  Radials/DP/PT 1+ and equal bilaterally.  Respiratory: Clear to auscultation bilaterally. Breathing is unlabored. GI: Soft, nontender, non-distended, BS +x 4. MS: no deformity. Extremities: No clubbing or cyanosis. No edema. Distal pedal pulses are 2+ and equal bilaterally. Neuro:  AAOx3. Follows commands. Psych:  Responds to questions appropriately with a normal affect.  Labs    Chemistry Recent Labs  Lab 11/19/17 0556  11/20/17 0221  11/20/17 1808 11/21/17 0125 11/21/17 0828  NA 118*   < > 119*   < > 122* 120* 121*  K 4.3  --  4.1  --   --  3.8  --   CL 84*  --  83*  --   --  79*  --   CO2 24  --  26  --   --  29  --   GLUCOSE 163*  --  133*  --   --  127*  --   BUN 24*  --  24*  --   --  24*  --   CREATININE 1.00  --  0.89  --   --  0.95  --   CALCIUM 8.1*  --  7.9*  --   --  7.8*  --   GFRNONAA >60  --  >60  --   --  >60  --   GFRAA >60  --  >60  --   --  >60  --   ANIONGAP 10  --  10  --   --  12  --    < > = values in this interval not displayed.     Hematology Recent Labs  Lab 11/18/17 0614 11/18/17 2350 11/19/17 0556  WBC 14.0* 12.1* 15.5*  RBC 4.38 3.95* 3.91*  HGB 13.5 12.3* 11.7*  HCT 38.8* 34.7* 34.3*  MCV 88.6 87.8 87.7  MCH 30.8 31.1 29.9  MCHC 34.8 35.4 34.1  RDW 12.9 12.9 12.9  PLT 344 324 336    Cardiac Enzymes Recent Labs  Lab 11/18/17 1537 11/18/17 2350 11/19/17 0232  TROPONINI 0.09* 0.08* 0.09*    Recent Labs  Lab 11/18/17 0626 11/18/17 1017  TROPIPOC 0.08 0.09*     BNP Recent Labs  Lab 11/18/17 0855  BNP 1,421.3*     DDimer No results for input(s): DDIMER in the last 168 hours.   Radiology    No results found.  Cardiac  Studies   TTE: 11/19/16  Study Conclusions  - Left ventricle: Septal and apical akinesis Inferior wall hypokinesis. The estimated ejection fraction was 35%. The study is not technically sufficient to allow evaluation of LV diastolic function. - Aortic valve: There was mild regurgitation. - Mitral valve: There was mild to moderate regurgitation. - Left atrium: The atrium was moderately dilated. - Right atrium: The atrium was severely dilated. - Atrial septum: No defect or patent foramen ovale was identified.    Patient Profile     84 y.o. 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 Found to have EF of 35%.    Assessment & Plan    1. AcutesystolicHF/cardiomyopathy: Weight is down 5lb from admission. He reports excellent UOP overnight. SBP a little soft this AM in the 90s. He already received AM dose. Will hold next dosing in anticipation of probable cath today. IM administered 1 unit FFP with repeat INR pending for noon. Cath is not scheduled until 3 tentatively. BP too low for ACEI/ARB.  2.Chronic atrial fib RVR: Known chronic Afib, on coumadin for [place]. Rate was elevated on admission, INR >10 on admission, given vitamin K. -- Dilt stopped yesterday given low EF and placed on Toprol XL 25mg . Consider further titration of Toprol to 75mg  daily although blood pressure may not allow for this. Digoxin is an option.  3. MV prolapse with moderate NW:GNFAO on echo from 12/17 -> was supposed to be pending TEE but patient was reluctant.Echo this admission showed mild to moderate regurg.  4. HTN: BP softer this AM; see above.  5. HL: continue statin.  6. Hyponatremia/chronic SIADH: Na+ 118-120 this admission, management per primary.  7. Abnormal thyroid function: TSH low-normal, elevated FT4 consistent with subclinical hyperthyroidism which could  be driving HR (increased risk of AF with subclinical hypothyroidism). Further per IM.  For questions or updates, please contact [place] Please consult [email or web address] for contact info under Cardiology/STEMI.  Signed, Charlie Pitter, PA-C 11/21/2017, 10:16 AM    Patient seen, examined.  Available data reviewed. Agree with findings, assessment, and plan as outlined by Melina Copa, PA-C.   Patient is alert and oriented in no distress.  JVP is normal.  Lung fields are clear.  Heart is irregularly irregular with a 2/6 holosystolic murmur at the apex.  Abdomen is soft and nontender.  There is no pretibial edema.  Discussed plans for proceeding with right and left heart catheterization today.  His INR was 1.9 this morning.  He received FFP with plans for a follow-up INR at noon today.  I suspect he will be okay to proceed with diagnostic catheterization from a radial approach.  We have reviewed risks and indications extensively.  He is eager to have his catheterization done.  I agree with the medication program he is currently taking and plans as outlined above.  We will follow-up after his heart cath procedure.  Sherren Mocha, M.D. 11/21/2017 11:17 AM

## 2017-11-21 NOTE — Progress Notes (Signed)
ANTICOAGULATION CONSULT NOTE - Follow Up Consult  Pharmacy Consult for  Heparin  Indication: atrial fibrillation  Allergies  Allergen Reactions  . Novocain [Procaine] Rash    Patient Measurements: Height: 5\' 10"  (177.8 cm) Weight: 185 lb 13.6 oz (84.3 kg) IBW/kg (Calculated) : 73 Heparin Dosing Weight: 86.7 kg  Vital Signs: Temp: 98.2 F (36.8 C) (01/11 0817) Temp Source: Oral (01/11 0817) BP: 120/105 (01/11 1100) Pulse Rate: 115 (01/11 1100)  Labs: Recent Labs    11/18/17 1537  11/18/17 2350  11/19/17 0232 11/19/17 0556  11/20/17 0221 11/20/17 1146 11/20/17 1808 11/21/17 0125 11/21/17 1216  HGB  --   --  12.3*  --   --  11.7*  --   --   --   --   --   --   HCT  --   --  34.7*  --   --  34.3*  --   --   --   --   --   --   PLT  --   --  324  --   --  336  --   --   --   --   --   --   LABPROT  --    < >  --    < >  --   --    < > 24.2* 22.9* 22.7* 21.8*  --   INR  --    < >  --    < >  --   --    < > 2.20 2.05 2.02 1.92  --   HEPARINUNFRC  --   --   --   --   --   --   --   --   --   --   --  0.17*  CREATININE 0.99   < > 1.01  --  0.97 1.00  --  0.89  --   --  0.95  --   TROPONINI 0.09*  --  0.08*  --  0.09*  --   --   --   --   --   --   --    < > = values in this interval not displayed.    Assessment: 14 yom presented to ED on 1/8 with SOB.  Hx of Afib on warfarin PTA, INR >10 in ED. Home warfarin regimen: 4 mg, then 3.5 mg x 2 days, then repeat cycle. Last dose 1/7.  Will start heparin as bridge to cath.  INR remained above 2 until this morning. Patient received three doses of Vitamin K. Heparin infusion started at 0519. First level came back subtherapeutic at 0.17, on 1100 units/hr. No infusion issues per nursing. No signs/symptoms of bleeding. Plan for cath this afternoon.  Goal of Therapy:  Heparin level 0.3-0.7 units/ml Monitor platelets by anticoagulation protocol: Yes   Plan:  Order 2500 unit bolus Increase heparin infusion to 1350 units/hr Daily  HL, CBC F/u plans for anticoagulation after cath  Doylene Canard, PharmD Clinical Pharmacist  Pager: [phone] Clinical Phone for 11/21/2017 until 3:30pm: [phone] If after 3:30pm, please call main pharmacy at [phone] 11/21/2017,1:13 PM

## 2017-11-21 NOTE — Progress Notes (Signed)
TR band removed. No bleeding noted. Right radial palpable pulses. Tolerated well. Will continue to montior. Educated patient to rest arm and not pull on the arm.

## 2017-11-21 NOTE — Interval H&P Note (Signed)
History and Physical Interval Note:  11/21/2017 3:36 PM  Ryan Savage  has presented today for surgery, with the diagnosis of cp - elevated trop  The various methods of treatment have been discussed with the patient and family. After consideration of risks, benefits and other options for treatment, the patient has consented to  Procedure(s): RIGHT/LEFT HEART CATH AND CORONARY ANGIOGRAPHY (N/A) as a surgical intervention .  The patient's history has been reviewed, patient examined, no change in status, stable for surgery.  I have reviewed the patient's chart and labs.  Questions were answered to the patient's satisfaction.     Sherren Mocha

## 2017-11-21 NOTE — Progress Notes (Signed)
ANTICOAGULATION CONSULT NOTE - Follow Up Consult  Pharmacy Consult for  Heparin  Indication: atrial fibrillation  Allergies  Allergen Reactions  . Novocain [Procaine] Rash    Patient Measurements: Height: 5\' 10"  (177.8 cm) Weight: 185 lb 13.6 oz (84.3 kg) IBW/kg (Calculated) : 73 Heparin Dosing Weight: 86.7 kg  Vital Signs: Temp: 97.4 F (36.3 C) (01/11 0342) Temp Source: Oral (01/11 0342) BP: 120/89 (01/11 0342) Pulse Rate: 99 (01/11 0342)  Labs: Recent Labs    11/18/17 0614  11/18/17 1537  11/18/17 2350  11/19/17 0232 11/19/17 0556  11/20/17 0221 11/20/17 1146 11/20/17 1808 11/21/17 0125  HGB 13.5  --   --   --  12.3*  --   --  11.7*  --   --   --   --   --   HCT 38.8*  --   --   --  34.7*  --   --  34.3*  --   --   --   --   --   PLT 344  --   --   --  324  --   --  336  --   --   --   --   --   LABPROT  --    < >  --    < >  --    < >  --   --    < > 24.2* 22.9* 22.7* 21.8*  INR  --    < >  --    < >  --    < >  --   --    < > 2.20 2.05 2.02 1.92  CREATININE 0.97   < > 0.99   < > 1.01  --  0.97 1.00  --  0.89  --   --  0.95  TROPONINI  --   --  0.09*  --  0.08*  --  0.09*  --   --   --   --   --   --    < > = values in this interval not displayed.    Assessment:  5 yom presented to ED on 1/8 with SOB.  Hx of Afib on warfarin PTA, INR >10 in ED.    INR remained above 2 until this morning. Patient received two doses of Vitamin K. Will start heparin as bridge to cath.    Home warfarin regimen: 4 mg, then 3.5 mg x 2 days, then repeat cycle. Last dose 1/7  Goal of Therapy:  Heparin level 0.3-0.7 units/ml Monitor platelets by anticoagulation protocol: Yes    Plan:   Heparin 1100 units/hr  Daily HL, CBC  First level later this morning  F/u plans for cath   Harvel Quale 11/21/2017,3:50 AM

## 2017-11-21 NOTE — Progress Notes (Addendum)
PROGRESS NOTE    Ryan Savage  ZOX:[id number] DOB: January 02, 1929 DOA: 11/18/2017 PCP: Lajean Manes, MD   Brief Narrative:  84 y.o. 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  He reports having gone 3 weeks with no sleep.  Poor PO intake, no appetite.  He developed congestion 2-3 days ago with significant worsening of his SOB.  SOB is primarily with exertion.  Cough is a deep cough, very little phlegm that is dark, brown, or white; 1 time with a trace of blood.  No chest pain.  No confusion.  Weakness has led to inability to do his usual activities, but prior to 3 weeks ago he was in [employer] and walking in the park.  Now, he can't even take the trash can to the street.  No fever.  No sick contacts.   ED Course:  Acute new CHF exacerbation and hyponatremia.  Chronic afib on Coumadin.  SOB that worsened last night, malaise and unable to exercise, cough x 3 weeks.  Afib with RVR 130-140 with hypoxia (4L [place] O2).  CXR with pulmonary edema.  BNP 1400.  Given Lasix, breathing treatments, Cardizem drip.    Subjective: 1/11 A/O 4, negative CP, negative SOB, negative abdominal pain negative N/V.    Assessment & Plan:   Principal Problem:   Acute exacerbation of CHF (congestive heart failure) (HCC) Active Problems:   Permanent atrial fibrillation (HCC)   Benign essential HTN   Mitral valve prolapse   Hyponatremia with excess extracellular fluid volume   Hyperglycemia   Supratherapeutic INR   Acute on chronic systolic heart failure (HCC)   Acute Systolic CHF/cardiomyopathy -negative prior history of CHF: CXR C/W pulmonary edemawith elevated BNP -strict I&O since admission -2.2 L  -Daily weight Filed Weights   11/19/17 0327 11/20/17 0347 11/21/17 0300  Weight: 190 lb 11.2 oz (86.5 kg) 191 lb 3.2 oz (86.7 kg) 185 lb 13.6 oz (84.3 kg)  -Toprol 50 mg daily -1/11 S/P RIGHT LEFT  artery catheterization: See results below     Chronic Atrial fibrillation(CHA2DS2-VASc Score is >2)/Supratherapeutic INR -Coumadin on hold secondary to being supratherapeutic and patient requires cardiac catheterization -Currently in NSR Recent Labs  Lab 11/19/17 1758 11/20/17 0221 11/20/17 1146 11/20/17 1808 11/21/17 0125 11/21/17 1216  INR 2.51 2.20 2.05 2.02 1.92 1.68  -patient treated with vitamin K PO 5 mg1 dose -1/10 repeat vitamin K  PO 2.5 mg: INR still above goal of 1.8 in order to allow cardiac catheterization. -1/11 continue heparin drip--> Coumadin -S/P right/left heart cardiac catheterization. Restart warfarin per pharmacy -on 1/12 resume diuretics per cardiology   Mitral valve prolapse -Per cardiology S/P cardiac mitral valve plane No Rolen current symptoms   Essential HTN  -See CHF  Severe Hyponatremia -Multifactorial hypervolemic secondary to CHF, SIADH (not observing fluid restriction)? -patient asymptomatic therefore current level of sodium must have been arrived at gradually: No history of altered mental status or seizure per patient and wife. -See SIADH and CHF  SIADH -SIADH exacerbating CHF findings of hyponatremia. Continue fluid restrict to 109ml/dy -Serum sodium TID -Sodium improving with fluid restriction and diuresis -do not correct> 8 mEq/L/day to avoid [place] Myelinolysis.   Hyperglycemia -May be stress response -Resolving -It is unlikely that he will need acute or chronic treatment for this issue    DVT prophylaxis: heparin drip Code Status: full Family Communication: wife at bedside for  discussion of plan care Disposition Plan: TBD   Consultants:  Cardiology     Procedures/Significant Events:  1/9 Echocardiogram:Left ventricle: -Septal and apical akinesis Inferior wall   hypokinesis. -LVEF=35%. -study not technically sufficient to allow evaluation of LV diastolic   function. - Mitral valve: mild to moderate  regurgitation. - Left atrium: moderately dilated.-Right atrium:severely dilated. 1/11 RIGHT/LEFT Cardiac Catheterization:. 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 Reviewed films with colleagues and we agree CTO-intervention would have a low likelihood of success in either the LAD or RCA secondary to anatomic features of heavy calcification and lesion length in the RCA and heavy calcification and nebulous stump in the LAD    I have personally reviewed and interpreted all radiology studies and my findings are as above.  VENTILATOR SETTINGS:    Cultures   Antimicrobials:    Devices    LINES / TUBES:      Continuous Infusions: . sodium chloride    . sodium chloride    . [START ON 11/22/2017] sodium chloride    . sodium chloride 1 mL/kg/hr (11/21/17 1800)  . heparin Stopped (11/21/17 1453)     Objective: Vitals:   11/21/17 1604 11/21/17 1609 11/21/17 1610 11/21/17 1815  BP: 109/63 (!) 110/58 115/65   Pulse: (!) 101 100 99 96  Resp: 12 20 17 20   Temp:      TempSrc:      SpO2: 93% 96% 93% 94%  Weight:      Height:        Intake/Output Summary (Last 24 hours) at 11/21/2017 1839 Last data filed at 11/21/2017 1800 Gross per 24 hour  Intake 372.82 ml  Output 2025 ml  Net -1652.18 ml   Filed Weights   11/19/17 0327 11/20/17 0347 11/21/17 0300  Weight: 190 lb 11.2 oz (86.5 kg) 191 lb 3.2 oz (86.7 kg) 185 lb 13.6 oz (84.3 kg)     Physical Exam:  General: A/O 4,No acute respiratory distress Neck:  Negative scars, masses, torticollis, lymphadenopathy, JVD Lungs: Clear to auscultation bilaterally without wheezes or crackles Cardiovascular: irregular irregular rhythm and rate,egative murmur gallop or rub normal S1 and S2 Abdomen: negative abdominal pain,  nondistended, positive soft, bowel sounds, no rebound, no ascites, no appreciable mass Extremities: No significant cyanosis, clubbing, or edema bilateral lower extremities Skin: Negative rashes, lesions, ulcers Psychiatric:  Negative depression, negative anxiety, negative fatigue, negative mania  Central nervous system:  Cranial nerves II through XII intact, tongue/uvula midline, all extremities muscle strength 5/5, sensation intact throughout, negative dysarthria, negative expressive aphasia, negative receptive aphasia.    Data Reviewed: Care during the described time interval was provided by me .  I have reviewed this patient's available data, including medical history, events of note, physical examination, and all test results as part of my evaluation.   CBC: Recent Labs  Lab 11/18/17 0614 11/18/17 2350 11/19/17 0556  WBC 14.0* 12.1* 15.5*  NEUTROABS  --  10.3*  --   HGB 13.5 12.3* 11.7*  HCT 38.8* 34.7* 34.3*  MCV 88.6 87.8 87.7  PLT 344 324 376   Basic Metabolic Panel: Recent Labs  Lab 11/18/17 2350 11/19/17 0232 11/19/17 0556  11/20/17 0221 11/20/17 1146 11/20/17 1808 11/21/17 0125 11/21/17 0828 11/21/17 1651  NA 118* 119* 118*   < > 119* 122* 122* 120* 121* 124*  K 4.2  4.3 4.3  --  4.1  --   --  3.8  --   --   CL 84* 84* 84*  --  83*  --   --  79*  --   --   CO2 22 21* 24  --  26  --   --  29  --   --   GLUCOSE 150* 146* 163*  --  133*  --   --  127*  --   --   BUN 23* 23* 24*  --  24*  --   --  24*  --   --   CREATININE 1.01 0.97 1.00  --  0.89  --   --  0.95  --   --   CALCIUM 8.1* 8.3* 8.1*  --  7.9*  --   --  7.8*  --   --   MG  --   --   --   --  1.8  --   --  2.0  --   --    < > = values in this interval not displayed.   GFR: Estimated Creatinine Clearance: 57.6 mL/min (by C-G formula based on SCr of 0.95 mg/dL). Liver Function Tests: No results for input(s): AST, ALT, ALKPHOS, BILITOT, PROT, ALBUMIN in the last 168 hours. No results for input(s):  LIPASE, AMYLASE in the last 168 hours. No results for input(s): AMMONIA in the last 168 hours. Coagulation Profile: Recent Labs  Lab 11/20/17 0221 11/20/17 1146 11/20/17 1808 11/21/17 0125 11/21/17 1216  INR 2.20 2.05 2.02 1.92 1.68   Cardiac Enzymes: Recent Labs  Lab 11/18/17 1537 11/18/17 2350 11/19/17 0232  TROPONINI 0.09* 0.08* 0.09*   BNP (last 3 results) No results for input(s): PROBNP in the last 8760 hours. HbA1C: No results for input(s): HGBA1C in the last 72 hours. CBG: No results for input(s): GLUCAP in the last 168 hours. Lipid Profile: Recent Labs    11/19/17 0232  CHOL 74  HDL 27*  LDLCALC 35  TRIG 58  CHOLHDL 2.7   Thyroid Function Tests: No results for input(s): TSH, T4TOTAL, FREET4, T3FREE, THYROIDAB in the last 72 hours. Anemia Panel: No results for input(s): VITAMINB12, FOLATE, FERRITIN, TIBC, IRON, RETICCTPCT in the last 72 hours. Urine analysis:    Component Value Date/Time   COLORURINE YELLOW 11/18/2017 0830   APPEARANCEUR CLEAR 11/18/2017 0830   LABSPEC 1.008 11/18/2017 0830   PHURINE 6.0 11/18/2017 0830   GLUCOSEU NEGATIVE 11/18/2017 0830   HGBUR MODERATE (A) 11/18/2017 0830   BILIRUBINUR NEGATIVE 11/18/2017 0830   KETONESUR 5 (A) 11/18/2017 0830   PROTEINUR NEGATIVE 11/18/2017 0830   NITRITE NEGATIVE 11/18/2017 0830   LEUKOCYTESUR NEGATIVE 11/18/2017 0830   Sepsis Labs: @LABRCNTIP (procalcitonin:4,lacticidven:4)  ) Recent Results (from the past 240 hour(s))  Respiratory Panel by PCR     Status: None   Collection Time: 11/18/17  8:55 AM  Result Value Ref Range Status   Adenovirus NOT DETECTED NOT DETECTED Final   Coronavirus 229E NOT DETECTED NOT DETECTED Final   Coronavirus HKU1 NOT DETECTED NOT DETECTED Final   Coronavirus NL63 NOT DETECTED NOT DETECTED Final   Coronavirus OC43 NOT DETECTED NOT DETECTED Final   Metapneumovirus NOT DETECTED NOT DETECTED Final   Rhinovirus / Enterovirus NOT DETECTED NOT DETECTED Final    Influenza A NOT DETECTED NOT DETECTED Final   Influenza B NOT DETECTED NOT DETECTED Final   Parainfluenza Virus 1 NOT DETECTED NOT DETECTED Final   Parainfluenza Virus 2 NOT DETECTED  NOT DETECTED Final   Parainfluenza Virus 3 NOT DETECTED NOT DETECTED Final   Parainfluenza Virus 4 NOT DETECTED NOT DETECTED Final   Respiratory Syncytial Virus NOT DETECTED NOT DETECTED Final   Bordetella pertussis NOT DETECTED NOT DETECTED Final   Chlamydophila pneumoniae NOT DETECTED NOT DETECTED Final   Mycoplasma pneumoniae NOT DETECTED NOT DETECTED Final         Radiology Studies: No results found.      Scheduled Meds: . aspirin EC  81 mg Oral Daily  . atorvastatin  20 mg Oral q1800  . docusate sodium  200 mg Oral QHS  . finasteride  5 mg Oral Daily  . metoprolol succinate  50 mg Oral Daily  . sodium chloride flush  3 mL Intravenous Q12H  . [START ON 11/22/2017] sodium chloride flush  3 mL Intravenous Q12H   Continuous Infusions: . sodium chloride    . sodium chloride    . [START ON 11/22/2017] sodium chloride    . sodium chloride 1 mL/kg/hr (11/21/17 1800)  . heparin Stopped (11/21/17 1453)     LOS: 3 days    Time spent: 40 minutes    Larinda Herter, Geraldo Docker, MD [facility] Pager [phone]   If 7PM-7AM, please contact night-coverage [email or web address] Password [facility] 11/21/2017, 6:39 PM

## 2017-11-21 NOTE — Progress Notes (Signed)
ANTICOAGULATION CONSULT NOTE - Follow Up Consult  Pharmacy Consult for  Heparin - restart 4hrs post-TR band removal; Warfarin Indication: atrial fibrillation  Allergies  Allergen Reactions  . Novocain [Procaine] Rash    Patient Measurements: Height: 5\' 10"  (177.8 cm) Weight: 185 lb 13.6 oz (84.3 kg) IBW/kg (Calculated) : 73 Heparin Dosing Weight: 86.7 kg  Vital Signs: Temp: 98.2 F (36.8 C) (01/11 0817) Temp Source: Oral (01/11 0817) BP: 115/65 (01/11 1610) Pulse Rate: 99 (01/11 1610)  Labs: Recent Labs    11/18/17 2350  11/19/17 0232 11/19/17 0556  11/20/17 0221  11/20/17 1808 11/21/17 0125 11/21/17 1216  HGB 12.3*  --   --  11.7*  --   --   --   --   --   --   HCT 34.7*  --   --  34.3*  --   --   --   --   --   --   PLT 324  --   --  336  --   --   --   --   --   --   LABPROT  --    < >  --   --    < > 24.2*   < > 22.7* 21.8* 19.6*  INR  --    < >  --   --    < > 2.20   < > 2.02 1.92 1.68  HEPARINUNFRC  --   --   --   --   --   --   --   --   --  0.17*  CREATININE 1.01  --  0.97 1.00  --  0.89  --   --  0.95  --   TROPONINI 0.08*  --  0.09*  --   --   --   --   --   --   --    < > = values in this interval not displayed.    Assessment: 23 yom presented to ED on 1/8 with SOB.  Hx of Afib on warfarin PTA, INR >10 in ED. Home warfarin regimen: 4 mg, then 3.5 mg x 2 days, then repeat cycle. Last dose 1/7 was 4mg .   Patient is on heparin as bridge while warfarin is held with cath.  INR 1.68 today (s/p 3 doses of vitamin K for total of 10mg ).  Patient now s/p cardiac cath and pharmacy consulted to start IV heparin 4 hrs after TR band removal and resume warfarin.   Goal of Therapy:  Heparin level 0.3-0.7 units/ml Monitor platelets by anticoagulation protocol: Yes   Plan:  Restart heparin infusion at 1100 units/hr at 0000 AM on 1/12 - 4 hrs post TR band removal.  Heparin level 8 hours after restart of drip.  Daily HL, CBC Warfarin 4mg  po x1.  Daily  PT/INR  Sloan Leiter, PharmD, BCPS, BCCCP Clinical Pharmacist Clinical phone 11/21/2017 until 11PM [phone] After hours, please call [phone] 11/21/2017,6:12 PM

## 2017-11-22 ENCOUNTER — Encounter (HOSPITAL_COMMUNITY): Payer: Self-pay | Admitting: Cardiovascular Disease

## 2017-11-22 DIAGNOSIS — I25118 Atherosclerotic heart disease of native coronary artery with other forms of angina pectoris: Secondary | ICD-10-CM

## 2017-11-22 DIAGNOSIS — E876 Hypokalemia: Secondary | ICD-10-CM

## 2017-11-22 DIAGNOSIS — I341 Nonrheumatic mitral (valve) prolapse: Secondary | ICD-10-CM

## 2017-11-22 DIAGNOSIS — E7849 Other hyperlipidemia: Secondary | ICD-10-CM

## 2017-11-22 DIAGNOSIS — E871 Hypo-osmolality and hyponatremia: Secondary | ICD-10-CM

## 2017-11-22 DIAGNOSIS — R791 Abnormal coagulation profile: Secondary | ICD-10-CM

## 2017-11-22 DIAGNOSIS — I10 Essential (primary) hypertension: Secondary | ICD-10-CM

## 2017-11-22 DIAGNOSIS — I5021 Acute systolic (congestive) heart failure: Secondary | ICD-10-CM

## 2017-11-22 LAB — BASIC METABOLIC PANEL
Anion gap: 10 (ref 5–15)
BUN: 29 mg/dL — ABNORMAL HIGH (ref 6–20)
CO2: 27 mmol/L (ref 22–32)
Calcium: 7.8 mg/dL — ABNORMAL LOW (ref 8.9–10.3)
Chloride: 85 mmol/L — ABNORMAL LOW (ref 101–111)
Creatinine, Ser: 0.89 mg/dL (ref 0.61–1.24)
GFR calc Af Amer: >60 mL/min (ref >60)
GFR calc non Af Amer: >60 mL/min (ref >60)
Glucose, Bld: 110 mg/dL — ABNORMAL HIGH (ref 65–99)
Potassium: 3.2 mmol/L — ABNORMAL LOW (ref 3.5–5.1)
Sodium: 122 mmol/L — ABNORMAL LOW (ref 135–145)

## 2017-11-22 LAB — CBC
HCT: 38.3 % — ABNORMAL LOW (ref 39.0–52.0)
Hemoglobin: 13.4 g/dL (ref 13.0–17.0)
MCH: 31 pg (ref 26.0–34.0)
MCHC: 35 g/dL (ref 30.0–36.0)
MCV: 88.7 fL (ref 78.0–100.0)
Platelets: 264 10*3/uL (ref 150–400)
RBC: 4.32 MIL/uL (ref 4.22–5.81)
RDW: 13.2 % (ref 11.5–15.5)
WBC: 13.4 10*3/uL — ABNORMAL HIGH (ref 4.0–10.5)

## 2017-11-22 LAB — SODIUM
Sodium: 123 mmol/L — ABNORMAL LOW (ref 135–145)
Sodium: 123 mmol/L — ABNORMAL LOW (ref 135–145)

## 2017-11-22 LAB — HEPARIN LEVEL (UNFRACTIONATED)
Heparin Unfractionated: 0.11 IU/mL — ABNORMAL LOW (ref 0.30–0.70)
Heparin Unfractionated: 0.4 IU/mL (ref 0.30–0.70)

## 2017-11-22 LAB — MAGNESIUM: Magnesium: 1.9 mg/dL (ref 1.7–2.4)

## 2017-11-22 LAB — PROTIME-INR
INR: 1.77
Prothrombin Time: 20.5 seconds — ABNORMAL HIGH (ref 11.4–15.2)

## 2017-11-22 LAB — GLUCOSE, CAPILLARY: Glucose-Capillary: 127 mg/dL — ABNORMAL HIGH (ref 65–99)

## 2017-11-22 MED ORDER — DIGOXIN 125 MCG PO TABS
0.1250 mg | ORAL_TABLET | Freq: Every day | ORAL | Status: DC
  Administered 2017-11-22 – 2017-11-27 (×6): 0.125 mg via ORAL
  Filled 2017-11-22 (×6): qty 1

## 2017-11-22 MED ORDER — WARFARIN SODIUM 2.5 MG PO TABS
3.5000 mg | ORAL_TABLET | Freq: Once | ORAL | Status: AC
  Administered 2017-11-22: 3.5 mg via ORAL
  Filled 2017-11-22: qty 1

## 2017-11-22 MED ORDER — POTASSIUM CHLORIDE CRYS ER 20 MEQ PO TBCR
60.0000 meq | EXTENDED_RELEASE_TABLET | Freq: Once | ORAL | Status: AC
  Administered 2017-11-22: 60 meq via ORAL
  Filled 2017-11-22: qty 3

## 2017-11-22 NOTE — Progress Notes (Signed)
Progress Note  Patient Name: Ryan Savage Date of Encounter: 11/22/2017  Primary Cardiologist: Fransico Him, MD   Subjective   Has a cough with phlegm. No chest pain or significant shortness of breath, no leg swelling.  Inpatient Medications    Scheduled Meds: . aspirin EC  81 mg Oral Daily  . atorvastatin  20 mg Oral q1800  . docusate sodium  200 mg Oral QHS  . finasteride  5 mg Oral Daily  . metoprolol succinate  50 mg Oral Daily  . sodium chloride flush  3 mL Intravenous Q12H  . sodium chloride flush  3 mL Intravenous Q12H  . warfarin  3.5 mg Oral ONCE-1800  . Warfarin - Pharmacist Dosing Inpatient   Does not apply q1800   Continuous Infusions: . sodium chloride    . sodium chloride    . sodium chloride    . heparin 1,300 Units/hr (11/22/17 1100)   PRN Meds: sodium chloride, sodium chloride, acetaminophen, alum & mag hydroxide-simeth, guaiFENesin-dextromethorphan, lip balm, loratadine, MUSCLE RUB, ondansetron (ZOFRAN) IV, phenol, polyvinyl alcohol, sodium chloride, sodium chloride flush, sodium chloride flush   Vital Signs    Vitals:   11/22/17 0400 11/22/17 0810 11/22/17 0900 11/22/17 1300  BP: 110/60 119/77 99/68   Pulse:  (!) 126 (!) 107   Resp: (!) 30 (!) 22 19   Temp:  97.7 F (36.5 C)  (!) 97.5 F (36.4 C)  TempSrc:  Oral  Oral  SpO2: 96% 93% 93%   Weight:      Height:        Intake/Output Summary (Last 24 hours) at 11/22/2017 1354 Last data filed at 11/22/2017 1303 Gross per 24 hour  Intake 534.8 ml  Output 1025 ml  Net -490.2 ml   Filed Weights   11/20/17 0347 11/21/17 0300 11/22/17 0333  Weight: 191 lb 3.2 oz (86.7 kg) 185 lb 13.6 oz (84.3 kg) 181 lb 6.4 oz (82.3 kg)    Telemetry    A fib, 90-110 bpm, occasional PVC's - Personally Reviewed  ECG    n/a - Personally Reviewed  Physical Exam   GEN: No acute distress.   Neck: No JVD Cardiac: Heart rate at upper normal limits, irregular rhythm, no murmurs, rubs, or gallops.    Respiratory: Clear to auscultation bilaterally. GI: Soft, nontender, non-distended  MS: No edema; No deformity. Neuro:  Nonfocal  Psych: Normal affect   Labs    Chemistry Recent Labs  Lab 11/20/17 0221  11/21/17 0125  11/21/17 1651 11/22/17 0150 11/22/17 0800  NA 119*   < > 120*   < > 124* 122*  123* 123*  K 4.1  --  3.8  --   --  3.2*  --   CL 83*  --  79*  --   --  85*  --   CO2 26  --  29  --   --  27  --   GLUCOSE 133*  --  127*  --   --  110*  --   BUN 24*  --  24*  --   --  29*  --   CREATININE 0.89  --  0.95  --   --  0.89  --   CALCIUM 7.9*  --  7.8*  --   --  7.8*  --   GFRNONAA >60  --  >60  --   --  >60  --   GFRAA >60  --  >60  --   --  >  60  --   ANIONGAP 10  --  12  --   --  10  --    < > = values in this interval not displayed.     Hematology Recent Labs  Lab 11/18/17 2350 11/19/17 0556 11/22/17 0150  WBC 12.1* 15.5* 13.4*  RBC 3.95* 3.91* 4.32  HGB 12.3* 11.7* 13.4  HCT 34.7* 34.3* 38.3*  MCV 87.8 87.7 88.7  MCH 31.1 29.9 31.0  MCHC 35.4 34.1 35.0  RDW 12.9 12.9 13.2  PLT 324 336 264    Cardiac Enzymes Recent Labs  Lab 11/18/17 1537 11/18/17 2350 11/19/17 0232  TROPONINI 0.09* 0.08* 0.09*    Recent Labs  Lab 11/18/17 0626 11/18/17 1017  TROPIPOC 0.08 0.09*     BNP Recent Labs  Lab 11/18/17 0855  BNP 1,421.3*     DDimer No results for input(s): DDIMER in the last 168 hours.   Radiology    No results found.  Cardiac Studies   Cath (11/21/17):  1. 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 Patent left main and circumflex/intermediate branches 3. Low right and left sided intracardiac filling pressures 4. Known severe LV systolic dysfunction  Recommend:  Medical therapy for [name]. Reviewed films with colleagues and we agree CTO-intervention would have a low likelihood of success in either the LAD or RCA secondary to anatomic features of heavy calcification and  lesion length in the RCA and heavy calcification and nebulous stump in the LAD  Resume warfarin tonight  With low filling pressures, hold diuretics x 24 hours and resume oral diuretics thereafter  Mitral regurgitation by most recent echo appears only mild-moderate. Cath shows no significant V-waves. Doubt mitral regurgitation is playing a major role at present.   Echo (11/19/17):  - Left ventricle: Septal and apical akinesis Inferior wall   hypokinesis. The estimated ejection fraction was 35%. The study   is not technically sufficient to allow evaluation of LV diastolic   function. - Aortic valve: There was mild regurgitation. - Mitral valve: There was mild to moderate regurgitation. - Left atrium: The atrium was moderately dilated. - Right atrium: The atrium was severely dilated. - Atrial septum: No defect or patent foramen ovale was identified.   Patient Profile     84 y.o. 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 Found to have EF of 35%.  Assessment & Plan    1. AcutesystolicHF/cardiomyopathy:Weight is down 5lb from admission, stable from yesterday. Diuretics currently on hold. Lungs are clear with no leg swelling. BP too low for ACEI/ARB. Continue Toprol-XL. Resume oral Lasix tomorrow.  2.Chronic atrial fib RVR: Known chronic Afib, on coumadin for [place] (currently on IV heparin as well, INR 1.77) . Rate was elevated on admission, INR >10 on admission, given vitamin K. -- Dilt stopped 2 days ago given low EF and placed on Toprol XL 25mg , increased to 50 mg. BP is soft. I will start oral digoxin 0.125 mg daily as he remains mildly tachycardic.  3. MV prolapse with moderate PP:JKDT this admission showed mild to moderate regurgitation and cath showed no significant v waves.  4. HTN: BP softer this AM; see above.  5. HL: continue statin.  6.  Hyponatremia/chronic SIADH: Na+ 118-120 this admission, up to 123. Management per primary.  7. Abnormal thyroid function: TSH low-normal, elevated FT4 consistent with subclinical  hyperthyroidism which could be driving HR (increased risk of AF with subclinical hypothyroidism). Further per IM.  8. [name]: See cath discussion above with occluded LAD and RCA with extensive collateralization. No anginal pain. Medical therapy with ASA, beta blockers, and statin.   For questions or updates, please contact [place] Please consult [email or web address] for contact info under Cardiology/STEMI.      Signed, Kate Sable, MD  11/22/2017, 1:54 PM

## 2017-11-22 NOTE — Progress Notes (Addendum)
PROGRESS NOTE    Ryan Savage  OJJ:[id number] DOB: 1929-11-01 DOA: 11/18/2017 PCP: Lajean Manes, MD   Brief Narrative:  84 y.o. 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  He reports having gone 3 weeks with no sleep.  Poor PO intake, no appetite.  He developed congestion 2-3 days ago with significant worsening of his SOB.  SOB is primarily with exertion.  Cough is a deep cough, very little phlegm that is dark, brown, or white; 1 time with a trace of blood.  No chest pain.  No confusion.  Weakness has led to inability to do his usual activities, but prior to 3 weeks ago he was in [employer] and walking in the park.  Now, he can't even take the trash can to the street.  No fever.  No sick contacts.   ED Course:  Acute new CHF exacerbation and hyponatremia.  Chronic afib on Coumadin.  SOB that worsened last night, malaise and unable to exercise, cough x 3 weeks.  Afib with RVR 130-140 with hypoxia (4L [place] O2).  CXR with pulmonary edema.  BNP 1400.  Given Lasix, breathing treatments, Cardizem drip.    Subjective: 1/12 A/O 4, negative CP, negative SOB, negative abdominal pain, negative N/V.    Assessment & Plan:   Principal Problem:   Acute exacerbation of CHF (congestive heart failure) (HCC) Active Problems:   Permanent atrial fibrillation (HCC)   Benign essential HTN   Mitral valve prolapse   Hyponatremia with excess extracellular fluid volume   Hyperglycemia   Supratherapeutic INR   Acute on chronic systolic heart failure (HCC)   Acute Systolic CHF/cardiomyopathy -negative prior history of CHF: CXR C/W pulmonary edemawith elevated BNP -strict I&O since admission -1.3 L -Daily weight Filed Weights   11/20/17 0347 11/21/17 0300 11/22/17 0333  Weight: 191 lb 3.2 oz (86.7 kg) 185 lb 13.6 oz (84.3 kg) 181 lb 6.4 oz (82.3 kg)  -Toprol 50 mg daily -1/11 S/P RIGHT LEFT  artery catheterization: See results below     Chronic Atrial fibrillation(CHA2DS2-VASc Score is >2)/Supratherapeutic INR -Coumadin on hold secondary to being supratherapeutic and patient requires cardiac catheterization -Currently in NSR Recent Labs  Lab 11/20/17 0221 11/20/17 1146 11/20/17 1808 11/21/17 0125 11/21/17 1216 11/22/17 0150  INR 2.20 2.05 2.02 1.92 1.68 1.77  -patient treated with vitamin K PO 5 mg1 dose -1/10 repeat vitamin K  PO 2.5 mg: INR still above goal of 1.8 in order to allow cardiac catheterization. -1/11 continue heparin drip--> Coumadin -S/P right/left heart cardiac catheterization. Restart warfarin per pharmacy -on 1/12 resume diuretics per cardiology   Mitral valve prolapse -Per cardiology S/P cardiac mitral valve plane No Rolen current symptoms   Essential HTN  -See CHF  Severe Hyponatremia -Multifactorial hypervolemic secondary to CHF, SIADH (not observing fluid restriction)? -patient asymptomatic therefore current level of sodium must have been arrived at gradually: No history of altered mental status or seizure per patient and wife. -See SIADH and CHF  SIADH -SIADH exacerbating CHF findings of hyponatremia. Continue fluid restrict to 1035ml/dy -Sodium improving with fluid restriction and diuresis -do not correct> 8 mEq/L/day to avoid Central [place] Myelinolysis.   Hyperglycemia -May be stress response -Resolving -It is unlikely that he will need acute or chronic treatment for this issue  Hypokalemia -Potassium goal> 4 -K-Dur 60 mEq  DVT prophylaxis: heparin drip Code Status: full Family Communication: wife at  bedside for discussion of plan care Disposition Plan: TBD   Consultants:  Cardiology     Procedures/Significant Events:  1/9 Echocardiogram:Left ventricle: -Septal and apical akinesis Inferior wall   hypokinesis. -LVEF=35%. -study not technically sufficient to allow evaluation of LV diastolic   function. - Mitral valve:  mild to moderate regurgitation. - Left atrium: moderately dilated.-Right atrium:severely dilated. 1/11 RIGHT/LEFT Cardiac Catheterization:. 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 Reviewed films with colleagues and we agree CTO-intervention would have a low likelihood of success in either the LAD or RCA secondary to anatomic features of heavy calcification and lesion length in the RCA and heavy calcification and nebulous stump in the LAD    I have personally reviewed and interpreted all radiology studies and my findings are as above.  VENTILATOR SETTINGS:    Cultures   Antimicrobials:    Devices    LINES / TUBES:      Continuous Infusions: . sodium chloride    . sodium chloride    . sodium chloride    . heparin 1,300 Units/hr (11/22/17 1100)     Objective: Vitals:   11/22/17 0810 11/22/17 0900 11/22/17 1300 11/22/17 1611  BP: 119/77 99/68    Pulse: (!) 126 (!) 107  (!) 101  Resp: (!) 22 19    Temp: 97.7 F (36.5 C)  (!) 97.5 F (36.4 C)   TempSrc: Oral  Oral   SpO2: 93% 93%    Weight:      Height:        Intake/Output Summary (Last 24 hours) at 11/22/2017 1704 Last data filed at 11/22/2017 1303 Gross per 24 hour  Intake 534.8 ml  Output 775 ml  Net -240.2 ml   Filed Weights   11/20/17 0347 11/21/17 0300 11/22/17 0333  Weight: 191 lb 3.2 oz (86.7 kg) 185 lb 13.6 oz (84.3 kg) 181 lb 6.4 oz (82.3 kg)     Physical Exam:  General: A/O 4 No acute respiratory distress Neck:  Negative scars, masses, torticollis, lymphadenopathy, JVD Lungs: Clear to auscultation bilaterally without wheezes or crackles Cardiovascular: Irregular irregular rhythm and rate without murmur gallop or rub normal S1 and S2 Abdomen: negative abdominal pain, nondistended, positive  soft, bowel sounds, no rebound, no ascites, no appreciable mass Extremities: No significant cyanosis, clubbing, or edema bilateral lower extremities Skin: Negative rashes, lesions, ulcers Psychiatric:  Negative depression, negative anxiety, negative fatigue, negative mania  Central nervous system:  Cranial nerves II through XII intact, tongue/uvula midline, all extremities muscle strength 5/5, sensation intact throughout,  negative dysarthria, negative expressive aphasia, negative receptive aphasia.   Data Reviewed: Care during the described time interval was provided by me .  I have reviewed this patient's available data, including medical history, events of note, physical examination, and all test results as part of my evaluation.   CBC: Recent Labs  Lab 11/18/17 0614 11/18/17 2350 11/19/17 0556 11/22/17 0150  WBC 14.0* 12.1* 15.5* 13.4*  NEUTROABS  --  10.3*  --   --   HGB 13.5 12.3* 11.7* 13.4  HCT 38.8* 34.7* 34.3* 38.3*  MCV 88.6 87.8 87.7 88.7  PLT 344 324 336 702   Basic Metabolic Panel: Recent Labs  Lab 11/19/17 0232 11/19/17 0556  11/20/17 0221  11/21/17 0125 11/21/17 0828 11/21/17 1651 11/22/17 0150 11/22/17 0800  NA 119* 118*   < >  119*   < > 120* 121* 124* 122*  123* 123*  K 4.3 4.3  --  4.1  --  3.8  --   --  3.2*  --   CL 84* 84*  --  83*  --  79*  --   --  85*  --   CO2 21* 24  --  26  --  29  --   --  27  --   GLUCOSE 146* 163*  --  133*  --  127*  --   --  110*  --   BUN 23* 24*  --  24*  --  24*  --   --  29*  --   CREATININE 0.97 1.00  --  0.89  --  0.95  --   --  0.89  --   CALCIUM 8.3* 8.1*  --  7.9*  --  7.8*  --   --  7.8*  --   MG  --   --   --  1.8  --  2.0  --   --  1.9  --    < > = values in this interval not displayed.   GFR: Estimated Creatinine Clearance: 61.5 mL/min (by C-G formula based on SCr of 0.89 mg/dL). Liver Function Tests: No results for input(s): AST, ALT, ALKPHOS, BILITOT, PROT, ALBUMIN in the last 168 hours. No results for  input(s): LIPASE, AMYLASE in the last 168 hours. No results for input(s): AMMONIA in the last 168 hours. Coagulation Profile: Recent Labs  Lab 11/20/17 1146 11/20/17 1808 11/21/17 0125 11/21/17 1216 11/22/17 0150  INR 2.05 2.02 1.92 1.68 1.77   Cardiac Enzymes: Recent Labs  Lab 11/18/17 1537 11/18/17 2350 11/19/17 0232  TROPONINI 0.09* 0.08* 0.09*   BNP (last 3 results) No results for input(s): PROBNP in the last 8760 hours. HbA1C: No results for input(s): HGBA1C in the last 72 hours. CBG: Recent Labs  Lab 11/22/17 0629  GLUCAP 127*   Lipid Profile: No results for input(s): CHOL, HDL, LDLCALC, TRIG, CHOLHDL, LDLDIRECT in the last 72 hours. Thyroid Function Tests: No results for input(s): TSH, T4TOTAL, FREET4, T3FREE, THYROIDAB in the last 72 hours. Anemia Panel: No results for input(s): VITAMINB12, FOLATE, FERRITIN, TIBC, IRON, RETICCTPCT in the last 72 hours. Urine analysis:    Component Value Date/Time   COLORURINE YELLOW 11/18/2017 0830   APPEARANCEUR CLEAR 11/18/2017 0830   LABSPEC 1.008 11/18/2017 0830   PHURINE 6.0 11/18/2017 0830   GLUCOSEU NEGATIVE 11/18/2017 0830   HGBUR MODERATE (A) 11/18/2017 0830   BILIRUBINUR NEGATIVE 11/18/2017 0830   KETONESUR 5 (A) 11/18/2017 0830   PROTEINUR NEGATIVE 11/18/2017 0830   NITRITE NEGATIVE 11/18/2017 0830   LEUKOCYTESUR NEGATIVE 11/18/2017 0830   Sepsis Labs: @LABRCNTIP (procalcitonin:4,lacticidven:4)  ) Recent Results (from the past 240 hour(s))  Respiratory Panel by PCR     Status: None   Collection Time: 11/18/17  8:55 AM  Result Value Ref Range Status   Adenovirus NOT DETECTED NOT DETECTED Final   Coronavirus 229E NOT DETECTED NOT DETECTED Final   Coronavirus HKU1 NOT DETECTED NOT DETECTED Final   Coronavirus NL63 NOT DETECTED NOT DETECTED Final   Coronavirus OC43 NOT DETECTED NOT DETECTED Final   Metapneumovirus NOT DETECTED NOT DETECTED Final   Rhinovirus / Enterovirus NOT DETECTED NOT DETECTED Final     Influenza A NOT DETECTED NOT DETECTED Final   Influenza B NOT DETECTED NOT DETECTED Final   Parainfluenza Virus 1 NOT DETECTED NOT DETECTED Final  Parainfluenza Virus 2 NOT DETECTED NOT DETECTED Final   Parainfluenza Virus 3 NOT DETECTED NOT DETECTED Final   Parainfluenza Virus 4 NOT DETECTED NOT DETECTED Final   Respiratory Syncytial Virus NOT DETECTED NOT DETECTED Final   Bordetella pertussis NOT DETECTED NOT DETECTED Final   Chlamydophila pneumoniae NOT DETECTED NOT DETECTED Final   Mycoplasma pneumoniae NOT DETECTED NOT DETECTED Final         Radiology Studies: No results found.      Scheduled Meds: . aspirin EC  81 mg Oral Daily  . atorvastatin  20 mg Oral q1800  . digoxin  0.125 mg Oral Daily  . docusate sodium  200 mg Oral QHS  . finasteride  5 mg Oral Daily  . metoprolol succinate  50 mg Oral Daily  . sodium chloride flush  3 mL Intravenous Q12H  . sodium chloride flush  3 mL Intravenous Q12H  . warfarin  3.5 mg Oral ONCE-1800  . Warfarin - Pharmacist Dosing Inpatient   Does not apply q1800   Continuous Infusions: . sodium chloride    . sodium chloride    . sodium chloride    . heparin 1,300 Units/hr (11/22/17 1100)     LOS: 4 days    Time spent: 40 minutes    Aurore Redinger, Geraldo Docker, MD [facility] Pager [phone]   If 7PM-7AM, please contact night-coverage [email or web address] Password [facility] 11/22/2017, 5:04 PM

## 2017-11-22 NOTE — Progress Notes (Signed)
Patient ambulated 230 ft using walker and on room air.  He tolerated the walk very well.

## 2017-11-22 NOTE — Progress Notes (Signed)
ANTICOAGULATION CONSULT NOTE - Follow Up Consult  Pharmacy Consult for  Heparin Indication: atrial fibrillation  Allergies  Allergen Reactions  . Novocain [Procaine] Rash    Patient Measurements: Height: 5\' 10"  (177.8 cm) Weight: 181 lb 6.4 oz (82.3 kg) IBW/kg (Calculated) : 73 Heparin Dosing Weight: 86.7 kg  Vital Signs: Temp: 97.5 F (36.4 C) (01/12 1300) Temp Source: Oral (01/12 1300) BP: 99/68 (01/12 0900) Pulse Rate: 101 (01/12 1611)  Labs: Recent Labs    11/20/17 0221  11/21/17 0125 11/21/17 1216 11/22/17 0150 11/22/17 0800 11/22/17 1856  HGB  --   --   --   --  13.4  --   --   HCT  --   --   --   --  38.3*  --   --   PLT  --   --   --   --  264  --   --   LABPROT 24.2*   < > 21.8* 19.6* 20.5*  --   --   INR 2.20   < > 1.92 1.68 1.77  --   --   HEPARINUNFRC  --   --   --  0.17*  --  0.11* 0.40  CREATININE 0.89  --  0.95  --  0.89  --   --    < > = values in this interval not displayed.    Assessment: 59 yom presented to ED on 1/8 with SOB.  Hx of Afib on warfarin PTA, INR >10 in ED.   Patient is on heparin as bridge while warfarin is held with cath. INR 1.68 yesterday prior to cath (s/p 3 doses of vitamin K for total of 10mg ).  Heparin level within goal at 0.4  Goal of Therapy:  Heparin level 0.3-0.7 units/ml Monitor platelets by anticoagulation protocol: Yes   Plan:  Increase heparin infusion to 1300 units/hr.  Daily HL, CBC  Levester Fresh, PharmD, BCPS, BCCCP Clinical Pharmacist Clinical phone for 11/22/2017 from 7a-3:30p: [phone] If after 3:30p, please call main pharmacy at: [phone] 11/22/2017 8:06 PM

## 2017-11-22 NOTE — Progress Notes (Signed)
ANTICOAGULATION CONSULT NOTE - Follow Up Consult  Pharmacy Consult for  Heparin; Warfarin Indication: atrial fibrillation  Allergies  Allergen Reactions  . Novocain [Procaine] Rash    Patient Measurements: Height: 5\' 10"  (177.8 cm) Weight: 181 lb 6.4 oz (82.3 kg) IBW/kg (Calculated) : 73 Heparin Dosing Weight: 86.7 kg  Vital Signs: Temp: 97.7 F (36.5 C) (01/12 0810) Temp Source: Oral (01/12 0810) BP: 119/77 (01/12 0810) Pulse Rate: 126 (01/12 0810)  Labs: Recent Labs    11/20/17 0221  11/21/17 0125 11/21/17 1216 11/22/17 0150 11/22/17 0800  HGB  --   --   --   --  13.4  --   HCT  --   --   --   --  38.3*  --   PLT  --   --   --   --  264  --   LABPROT 24.2*   < > 21.8* 19.6* 20.5*  --   INR 2.20   < > 1.92 1.68 1.77  --   HEPARINUNFRC  --   --   --  0.17*  --  0.11*  CREATININE 0.89  --  0.95  --  0.89  --    < > = values in this interval not displayed.    Assessment: 46 yom presented to ED on 1/8 with SOB.  Hx of Afib on warfarin PTA, INR >10 in ED. Home warfarin regimen: 4 mg, then 3.5 mg x 2 days, then repeat cycle. Last dose 1/7 was 4mg .   Patient is on heparin as bridge while warfarin is held with cath. INR 1.68 yesterday prior to cath (s/p 3 doses of vitamin K for total of 10mg ).  Heparin level ~8 hrs after restart of infusion came back subtherapeutic at 0.11, on 1000 units/hr. No infusion issues noted by nursing. INR increased today to 1.77, following 4 mg dose last night. No signs/symptoms of bleeding.   Goal of Therapy:  Heparin level 0.3-0.7 units/ml Monitor platelets by anticoagulation protocol: Yes   Plan:  Increase heparin infusion to 1300 units/hr.  Check heparin level 8 hours after rate change.  Daily HL, CBC  Warfarin 3.5 mg po x1.  Daily PT/INR  Doylene Canard, PharmD Clinical Pharmacist  Pager: [phone] Clinical Phone for 11/22/2017 until 3:30pm: [phone] If after 3:30pm, please call main pharmacy at [phone] 11/22/2017,10:03 AM

## 2017-11-23 DIAGNOSIS — I429 Cardiomyopathy, unspecified: Secondary | ICD-10-CM

## 2017-11-23 DIAGNOSIS — E0590 Thyrotoxicosis, unspecified without thyrotoxic crisis or storm: Secondary | ICD-10-CM

## 2017-11-23 DIAGNOSIS — R739 Hyperglycemia, unspecified: Secondary | ICD-10-CM

## 2017-11-23 LAB — BASIC METABOLIC PANEL
Anion gap: 11 (ref 5–15)
BUN: 22 mg/dL — ABNORMAL HIGH (ref 6–20)
CO2: 25 mmol/L (ref 22–32)
Calcium: 8 mg/dL — ABNORMAL LOW (ref 8.9–10.3)
Chloride: 87 mmol/L — ABNORMAL LOW (ref 101–111)
Creatinine, Ser: 0.85 mg/dL (ref 0.61–1.24)
GFR calc Af Amer: >60 mL/min (ref >60)
GFR calc non Af Amer: >60 mL/min (ref >60)
Glucose, Bld: 101 mg/dL — ABNORMAL HIGH (ref 65–99)
Potassium: 3.9 mmol/L (ref 3.5–5.1)
Sodium: 123 mmol/L — ABNORMAL LOW (ref 135–145)

## 2017-11-23 LAB — HEPARIN LEVEL (UNFRACTIONATED): Heparin Unfractionated: 0.57 IU/mL (ref 0.30–0.70)

## 2017-11-23 LAB — CBC
HCT: 38 % — ABNORMAL LOW (ref 39.0–52.0)
Hemoglobin: 13.3 g/dL (ref 13.0–17.0)
MCH: 31 pg (ref 26.0–34.0)
MCHC: 35 g/dL (ref 30.0–36.0)
MCV: 88.6 fL (ref 78.0–100.0)
Platelets: 260 10*3/uL (ref 150–400)
RBC: 4.29 MIL/uL (ref 4.22–5.81)
RDW: 13.1 % (ref 11.5–15.5)
WBC: 13.2 10*3/uL — ABNORMAL HIGH (ref 4.0–10.5)

## 2017-11-23 LAB — PROTIME-INR
INR: 2.55
Prothrombin Time: 27.3 seconds — ABNORMAL HIGH (ref 11.4–15.2)

## 2017-11-23 LAB — MAGNESIUM: Magnesium: 1.8 mg/dL (ref 1.7–2.4)

## 2017-11-23 MED ORDER — DIGOXIN 0.25 MG/ML IJ SOLN
0.2500 mg | Freq: Once | INTRAMUSCULAR | Status: AC
  Administered 2017-11-23: 0.25 mg via INTRAVENOUS
  Filled 2017-11-23: qty 1

## 2017-11-23 MED ORDER — MAGNESIUM HYDROXIDE 400 MG/5ML PO SUSP
30.0000 mL | Freq: Every day | ORAL | Status: DC | PRN
  Administered 2017-11-23: 30 mL via ORAL
  Filled 2017-11-23: qty 30

## 2017-11-23 MED ORDER — WARFARIN SODIUM 1 MG PO TABS
1.0000 mg | ORAL_TABLET | Freq: Once | ORAL | Status: AC
  Administered 2017-11-23: 1 mg via ORAL
  Filled 2017-11-23: qty 1

## 2017-11-23 NOTE — Progress Notes (Signed)
PROGRESS NOTE    Ryan Savage  LKG:[id number] DOB: Sep 19, 1929 DOA: 11/18/2017 PCP: Lajean Manes, MD   Brief Narrative:  84 y.o. 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  He reports having gone 3 weeks with no sleep.  Poor PO intake, no appetite.  He developed congestion 2-3 days ago with significant worsening of his SOB.  SOB is primarily with exertion.  Cough is a deep cough, very little phlegm that is dark, brown, or white; 1 time with a trace of blood.  No chest pain.  No confusion.  Weakness has led to inability to do his usual activities, but prior to 3 weeks ago he was in [employer] and walking in the park.  Now, he can't even take the trash can to the street.  No fever.  No sick contacts.   ED Course:  Acute new CHF exacerbation and hyponatremia.  Chronic afib on Coumadin.  SOB that worsened last night, malaise and unable to exercise, cough x 3 weeks.  Afib with RVR 130-140 with hypoxia (4L [place] O2).  CXR with pulmonary edema.  BNP 1400.  Given Lasix, breathing treatments, Cardizem drip.    Subjective: 1/13 A/O 4, negative CP, negative SOB, negative abdominal pain, negative N/V   Assessment & Plan:   Principal Problem:   Acute exacerbation of CHF (congestive heart failure) (HCC) Active Problems:   Permanent atrial fibrillation (HCC)   Benign essential HTN   Mitral valve prolapse   Hyponatremia with excess extracellular fluid volume   Hyperglycemia   Supratherapeutic INR   Acute on chronic systolic heart failure (HCC)   Acute Systolic CHF/cardiomyopathy -negative prior history of CHF: CXR C/W pulmonary edemawith elevated BNP -strict I&O since admission -862 mL  -Daily weight Filed Weights   11/21/17 0300 11/22/17 0333 11/23/17 0352  Weight: 185 lb 13.6 oz (84.3 kg) 181 lb 6.4 oz (82.3 kg) 183 lb 6.4 oz (83.2 kg)  -1/11 S/P RIGHT LEFT artery  catheterization: See results below   -Digoxin 0.25 mg 1: Then 0.125 mg daily -Toprol 50 mg daily    Chronic Atrial fibrillation(CHA2DS2-VASc Score is >2)/Supratherapeutic INR -Coumadin on hold secondary to being supratherapeutic and patient requires cardiac catheterization -Currently in NSR Recent Labs  Lab 11/20/17 1146 11/20/17 1808 11/21/17 0125 11/21/17 1216 11/22/17 0150 11/23/17 0420  INR 2.05 2.02 1.92 1.68 1.77 2.55  -patient treated with vitamin K PO 5 mg1 dose -1/10 repeat vitamin K  PO 2.5 mg: INR still above goal of 1.8 in order to allow cardiac catheterization. -1/11 continue heparin drip--> Coumadin -S/P right/left heart cardiac catheterization. Restart warfarin per pharmacy  Mitral valve prolapse -Per cardiology S/P cardiac mitral valve prolapse no current symptoms   Essential HTN  -See CHF  Severe Hyponatremia -Multifactorial hypervolemic secondary to CHF, SIADH (not observing fluid restriction)? -patient asymptomatic therefore current level of sodium must have been arrived at gradually: No history of altered mental status or seizure per patient and wife. -See SIADH and CHF  SIADH -SIADH exacerbating CHF findings of hyponatremia. Continue fluid restrict to 1065ml/dy -Sodium improving with fluid restriction: Diuretics held secondary to borderline BP  -do not correct> 8 mEq/L/day to avoid [place] Myelinolysis.   Hyperglycemia -Resolved  Hypokalemia -Potassium goal> 4  Subclinical Hyperthyroidism? -TSH low normal, free T4 mildly elevated -Not to events playing large part inpatient A. fib nor convenienced should be worked up or treated  acutely. However cardiology concern consult Endocrinology on 1/14     DVT prophylaxis: heparin drip Code Status: full Family Communication: wife at bedside for discussion of plan care Disposition Plan: TBD   Consultants:  Cardiology     Procedures/Significant Events:  1/9 Echocardiogram:Left  ventricle: -Septal and apical akinesis Inferior wall   hypokinesis. -LVEF=35%. -study not technically sufficient to allow evaluation of LV diastolic   function. - Mitral valve: mild to moderate regurgitation. - Left atrium: moderately dilated.-Right atrium:severely dilated. 1/11 RIGHT/LEFT Cardiac Catheterization:. 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 Reviewed films with colleagues and we agree CTO-intervention would have a low likelihood of success in either the LAD or RCA secondary to anatomic features of heavy calcification and lesion length in the RCA and heavy calcification and nebulous stump in the LAD    I have personally reviewed and interpreted all radiology studies and my findings are as above.  VENTILATOR SETTINGS:    Cultures   Antimicrobials:    Devices    LINES / TUBES:      Continuous Infusions: . sodium chloride    . sodium chloride    . sodium chloride       Objective: Vitals:   11/23/17 0352 11/23/17 0753 11/23/17 0800 11/23/17 1237  BP: (!) 120/91 99/71 115/77 106/79  Pulse:  (!) 106 (!) 115 99  Resp:  18 (!) 23 20  Temp: 97.6 F (36.4 C) 97.6 F (36.4 C)  97.9 F (36.6 C)  TempSrc: Oral Oral  Oral  SpO2:  97% 98% 94%  Weight: 183 lb 6.4 oz (83.2 kg)     Height:        Intake/Output Summary (Last 24 hours) at 11/23/2017 1433 Last data filed at 11/23/2017 1235 Gross per 24 hour  Intake 1429.52 ml  Output 150 ml  Net 1279.52 ml   Filed Weights   11/21/17 0300 11/22/17 0333 11/23/17 0352  Weight: 185 lb 13.6 oz (84.3 kg) 181 lb 6.4 oz (82.3 kg) 183 lb 6.4 oz (83.2 kg)     Physical Exam:  General: A/O 4, No acute respiratory distress Neck:  Negative scars, masses, torticollis, lymphadenopathy, JVD Lungs: Clear to auscultation  bilaterally without wheezes or crackles Cardiovascular: Irregular irregular rhythm and rate without murmur gallop or rub normal S1 and S2 Abdomen: negative abdominal pain, nondistended, positive soft, bowel sounds, no rebound, no ascites, no appreciable mass Extremities: No significant cyanosis, clubbing, or edema bilateral lower extremities Skin: Negative rashes, lesions, ulcers Psychiatric:  Negative depression, negative anxiety, negative fatigue, negative mania  Central nervous system:  Cranial nerves II through XII intact, tongue/uvula midline, all extremities muscle strength 5/5, sensation intact throughout, negative dysarthria, negative expressive aphasia, negative receptive aphasia.     Data Reviewed: Care during the described time interval was provided by me .  I have reviewed this patient's available data, including medical history, events of note, physical examination, and all test results as part of my evaluation.   CBC: Recent Labs  Lab 11/18/17 0614 11/18/17 2350 11/19/17 0556 11/22/17 0150 11/23/17 0420  WBC 14.0* 12.1* 15.5* 13.4* 13.2*  NEUTROABS  --  10.3*  --   --   --   HGB 13.5 12.3* 11.7* 13.4 13.3  HCT 38.8* 34.7* 34.3* 38.3* 38.0*  MCV 88.6 87.8 87.7 88.7 88.6  PLT 344 324 336 264  983   Basic Metabolic Panel: Recent Labs  Lab 11/19/17 0556  11/20/17 0221  11/21/17 0125 11/21/17 0828 11/21/17 1651 11/22/17 0150 11/22/17 0800 11/23/17 0420  NA 118*   < > 119*   < > 120* 121* 124* 122*  123* 123* 123*  K 4.3  --  4.1  --  3.8  --   --  3.2*  --  3.9  CL 84*  --  83*  --  79*  --   --  85*  --  87*  CO2 24  --  26  --  29  --   --  27  --  25  GLUCOSE 163*  --  133*  --  127*  --   --  110*  --  101*  BUN 24*  --  24*  --  24*  --   --  29*  --  22*  CREATININE 1.00  --  0.89  --  0.95  --   --  0.89  --  0.85  CALCIUM 8.1*  --  7.9*  --  7.8*  --   --  7.8*  --  8.0*  MG  --   --  1.8  --  2.0  --   --  1.9  --  1.8   < > = values in this interval  not displayed.   GFR: Estimated Creatinine Clearance: 64.4 mL/min (by C-G formula based on SCr of 0.85 mg/dL). Liver Function Tests: No results for input(s): AST, ALT, ALKPHOS, BILITOT, PROT, ALBUMIN in the last 168 hours. No results for input(s): LIPASE, AMYLASE in the last 168 hours. No results for input(s): AMMONIA in the last 168 hours. Coagulation Profile: Recent Labs  Lab 11/20/17 1808 11/21/17 0125 11/21/17 1216 11/22/17 0150 11/23/17 0420  INR 2.02 1.92 1.68 1.77 2.55   Cardiac Enzymes: Recent Labs  Lab 11/18/17 1537 11/18/17 2350 11/19/17 0232  TROPONINI 0.09* 0.08* 0.09*   BNP (last 3 results) No results for input(s): PROBNP in the last 8760 hours. HbA1C: No results for input(s): HGBA1C in the last 72 hours. CBG: Recent Labs  Lab 11/22/17 0629  GLUCAP 127*   Lipid Profile: No results for input(s): CHOL, HDL, LDLCALC, TRIG, CHOLHDL, LDLDIRECT in the last 72 hours. Thyroid Function Tests: No results for input(s): TSH, T4TOTAL, FREET4, T3FREE, THYROIDAB in the last 72 hours. Anemia Panel: No results for input(s): VITAMINB12, FOLATE, FERRITIN, TIBC, IRON, RETICCTPCT in the last 72 hours. Urine analysis:    Component Value Date/Time   COLORURINE YELLOW 11/18/2017 0830   APPEARANCEUR CLEAR 11/18/2017 0830   LABSPEC 1.008 11/18/2017 0830   PHURINE 6.0 11/18/2017 0830   GLUCOSEU NEGATIVE 11/18/2017 0830   HGBUR MODERATE (A) 11/18/2017 0830   BILIRUBINUR NEGATIVE 11/18/2017 0830   KETONESUR 5 (A) 11/18/2017 0830   PROTEINUR NEGATIVE 11/18/2017 0830   NITRITE NEGATIVE 11/18/2017 0830   LEUKOCYTESUR NEGATIVE 11/18/2017 0830   Sepsis Labs: @LABRCNTIP (procalcitonin:4,lacticidven:4)  ) Recent Results (from the past 240 hour(s))  Respiratory Panel by PCR     Status: None   Collection Time: 11/18/17  8:55 AM  Result Value Ref Range Status   Adenovirus NOT DETECTED NOT DETECTED Final   Coronavirus 229E NOT DETECTED NOT DETECTED Final   Coronavirus HKU1 NOT  DETECTED NOT DETECTED Final   Coronavirus NL63 NOT DETECTED NOT DETECTED Final   Coronavirus OC43 NOT DETECTED NOT DETECTED Final   Metapneumovirus NOT DETECTED NOT DETECTED Final   Rhinovirus / Enterovirus NOT  DETECTED NOT DETECTED Final   Influenza A NOT DETECTED NOT DETECTED Final   Influenza B NOT DETECTED NOT DETECTED Final   Parainfluenza Virus 1 NOT DETECTED NOT DETECTED Final   Parainfluenza Virus 2 NOT DETECTED NOT DETECTED Final   Parainfluenza Virus 3 NOT DETECTED NOT DETECTED Final   Parainfluenza Virus 4 NOT DETECTED NOT DETECTED Final   Respiratory Syncytial Virus NOT DETECTED NOT DETECTED Final   Bordetella pertussis NOT DETECTED NOT DETECTED Final   Chlamydophila pneumoniae NOT DETECTED NOT DETECTED Final   Mycoplasma pneumoniae NOT DETECTED NOT DETECTED Final         Radiology Studies: No results found.      Scheduled Meds: . aspirin EC  81 mg Oral Daily  . atorvastatin  20 mg Oral q1800  . digoxin  0.125 mg Oral Daily  . docusate sodium  200 mg Oral QHS  . finasteride  5 mg Oral Daily  . metoprolol succinate  50 mg Oral Daily  . sodium chloride flush  3 mL Intravenous Q12H  . sodium chloride flush  3 mL Intravenous Q12H  . warfarin  1 mg Oral ONCE-1800  . Warfarin - Pharmacist Dosing Inpatient   Does not apply q1800   Continuous Infusions: . sodium chloride    . sodium chloride    . sodium chloride       LOS: 5 days    Time spent: 40 minutes    Jenille Laszlo, Geraldo Docker, MD [facility] Pager [phone]   If 7PM-7AM, please contact night-coverage [email or web address] Password [facility] 11/23/2017, 2:33 PM

## 2017-11-23 NOTE — Progress Notes (Signed)
ANTICOAGULATION CONSULT NOTE - Follow Up Consult  Pharmacy Consult for  Heparin Indication: atrial fibrillation  Allergies  Allergen Reactions  . Novocain [Procaine] Rash    Patient Measurements: Height: 5\' 10"  (177.8 cm) Weight: 183 lb 6.4 oz (83.2 kg) IBW/kg (Calculated) : 73 Heparin Dosing Weight: 86.7 kg  Vital Signs: Temp: 97.6 F (36.4 C) (01/13 0753) Temp Source: Oral (01/13 0753) BP: 115/77 (01/13 0800) Pulse Rate: 115 (01/13 0800)  Labs: Recent Labs    11/21/17 0125  11/21/17 1216 11/22/17 0150 11/22/17 0800 11/22/17 1856 11/23/17 0420  HGB  --   --   --  13.4  --   --  13.3  HCT  --   --   --  38.3*  --   --  38.0*  PLT  --   --   --  264  --   --  260  LABPROT 21.8*  --  19.6* 20.5*  --   --  27.3*  INR 1.92  --  1.68 1.77  --   --  2.55  HEPARINUNFRC  --    < > 0.17*  --  0.11* 0.40 0.57  CREATININE 0.95  --   --  0.89  --   --  0.85   < > = values in this interval not displayed.    Assessment: 66 yom presented to ED on 1/8 with SOB.  Hx of Afib on warfarin PTA, INR >10 in ED.   Patient is on heparin as bridge while warfarin was held with cath and INR subtherapeutic. INR today is 2.55 - discussed with cardiology about discontinuing infusion given therapeutic INR. Heparin level had come back within therapeutic range at 0.57, on 1300 units/hr.  Warfarin resumed 1/11 and INR has increased from 1.68>1.77>2.55. CBC is stable today. No signs/symptoms of bleeding.   Goal of Therapy:  Heparin level 0.3-0.7 units/ml Monitor platelets by anticoagulation protocol: Yes   Plan:  Discontinue heparin infusion  Order warfarin 1 mg due to INR increase  Daily HL, CBC  Doylene Canard, PharmD Clinical Pharmacist  Pager: [phone] Clinical Phone for 11/23/2017 until 3:30pm: [phone] If after 3:30pm, please call main pharmacy at [phone] 11/23/2017 12:25 PM

## 2017-11-23 NOTE — Progress Notes (Signed)
Progress Note  Patient Name: Aarya Quebedeaux Date of Encounter: 11/23/2017  Primary Cardiologist: Fransico Him, MD   Subjective   Had some sinus drainage last night, has improved some this morning. No chest pain or shortness of breath.  Inpatient Medications    Scheduled Meds: . aspirin EC  81 mg Oral Daily  . atorvastatin  20 mg Oral q1800  . digoxin  0.125 mg Oral Daily  . docusate sodium  200 mg Oral QHS  . finasteride  5 mg Oral Daily  . metoprolol succinate  50 mg Oral Daily  . sodium chloride flush  3 mL Intravenous Q12H  . sodium chloride flush  3 mL Intravenous Q12H  . Warfarin - Pharmacist Dosing Inpatient   Does not apply q1800   Continuous Infusions: . sodium chloride    . sodium chloride    . sodium chloride    . heparin 1,300 Units/hr (11/23/17 0528)   PRN Meds: sodium chloride, sodium chloride, acetaminophen, alum & mag hydroxide-simeth, guaiFENesin-dextromethorphan, lip balm, loratadine, MUSCLE RUB, ondansetron (ZOFRAN) IV, phenol, polyvinyl alcohol, sodium chloride, sodium chloride flush, sodium chloride flush   Vital Signs    Vitals:   11/23/17 0002 11/23/17 0352 11/23/17 0753 11/23/17 0800  BP: 106/69 (!) 120/91 99/71 115/77  Pulse:   (!) 106 (!) 115  Resp:   18 (!) 23  Temp: 98.9 F (37.2 C) 97.6 F (36.4 C) 97.6 F (36.4 C)   TempSrc: Oral Oral Oral   SpO2:   97% 98%  Weight:  183 lb 6.4 oz (83.2 kg)    Height:        Intake/Output Summary (Last 24 hours) at 11/23/2017 0910 Last data filed at 11/23/2017 0527 Gross per 24 hour  Intake 1309.52 ml  Output 325 ml  Net 984.52 ml   Filed Weights   11/21/17 0300 11/22/17 0333 11/23/17 0352  Weight: 185 lb 13.6 oz (84.3 kg) 181 lb 6.4 oz (82.3 kg) 183 lb 6.4 oz (83.2 kg)    Telemetry    A fib (HR 100-120's) with PVC's - Personally Reviewed  ECG    n/a - Personally Reviewed  Physical Exam   GEN: No acute distress.   Neck: No JVD Cardiac: Tachycardic, irregular, no murmurs, rubs, or  gallops.  Respiratory: Clear to auscultation bilaterally. GI: Soft, nontender, non-distended  MS: No edema; No deformity. Neuro:  Nonfocal  Psych: Normal affect   Labs    Chemistry Recent Labs  Lab 11/21/17 0125  11/22/17 0150 11/22/17 0800 11/23/17 0420  NA 120*   < > 122*  123* 123* 123*  K 3.8  --  3.2*  --  3.9  CL 79*  --  85*  --  87*  CO2 29  --  27  --  25  GLUCOSE 127*  --  110*  --  101*  BUN 24*  --  29*  --  22*  CREATININE 0.95  --  0.89  --  0.85  CALCIUM 7.8*  --  7.8*  --  8.0*  GFRNONAA >60  --  >60  --  >60  GFRAA >60  --  >60  --  >60  ANIONGAP 12  --  10  --  11   < > = values in this interval not displayed.     Hematology Recent Labs  Lab 11/19/17 0556 11/22/17 0150 11/23/17 0420  WBC 15.5* 13.4* 13.2*  RBC 3.91* 4.32 4.29  HGB 11.7* 13.4 13.3  HCT 34.3* 38.3*  38.0*  MCV 87.7 88.7 88.6  MCH 29.9 31.0 31.0  MCHC 34.1 35.0 35.0  RDW 12.9 13.2 13.1  PLT 336 264 260    Cardiac Enzymes Recent Labs  Lab 11/18/17 1537 11/18/17 2350 11/19/17 0232  TROPONINI 0.09* 0.08* 0.09*    Recent Labs  Lab 11/18/17 0626 11/18/17 1017  TROPIPOC 0.08 0.09*     BNP Recent Labs  Lab 11/18/17 0855  BNP 1,421.3*     DDimer No results for input(s): DDIMER in the last 168 hours.   Radiology    No results found.  Cardiac Studies   Cath (11/21/17):  1. 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 Patent left main and circumflex/intermediate branches 3. Low right and left sided intracardiac filling pressures 4. Known severe LV systolic dysfunction  Recommend:  Medical therapy for [name]. Reviewed films with colleagues and we agree CTO-intervention would have a low likelihood of success in either the LAD or RCA secondary to anatomic features of heavy calcification and lesion length in the RCA and heavy calcification and nebulous stump in the LAD  Resume warfarin tonight  With low  filling pressures, hold diuretics x 24 hours and resume oral diuretics thereafter  Mitral regurgitation by most recent echo appears only mild-moderate. Cath shows no significant V-waves. Doubt mitral regurgitation is playing a major role at present.   Echo (11/19/17):  - Left ventricle: Septal and apical akinesis Inferior wall hypokinesis. The estimated ejection fraction was 35%. The study is not technically sufficient to allow evaluation of LV diastolic function. - Aortic valve: There was mild regurgitation. - Mitral valve: There was mild to moderate regurgitation. - Left atrium: The atrium was moderately dilated. - Right atrium: The atrium was severely dilated. - Atrial septum: No defect or patent foramen ovale was identified.  Patient Profile     84 y.o. male with a PMH ofposterior MV leaflet prolapse withmoderate MR, HTN, chronic Afib, BPH, HTN, HLD, SIADH, thrombocytopeniawho presented with ongoing cough and shortness of breath. Found to have EF of 35% and [name].  Assessment & Plan    1. AcutesystolicHF/cardiomyopathy:Weight is down 5lb from admission, stable from yesterday. Diuretics currently on hold. Lungs are clear with no leg swelling. BP tends to fluctuate and at times too low for ACEI/ARB. Continue Toprol-XL. Resume oral Lasix tomorrow.  2.Chronic atrial fib, currently with RVR: Known chronic Afib, on coumadin for [place] (currently on IV heparin as well, INR now 2.55) . Rate was elevated on admission, INR >10 on admission, given vitamin K. -- Dilt stopped a few days agogiven low EFand placed on Toprol XL 25mg , increased to 50 mg. BP tends to fluctuate and at times soft. I started oral digoxin 0.125 mg daily on 1/12. I will give an additional 0.25 mg IV today.  3. MV prolapse with moderate VZ:DGLO this admission showed mild to moderate regurgitation and cath showed no significant v waves.  4. HTN:BP stable; see above.  5. VF:IEPPIRJJOACZYS.  6.  Hyponatremia/chronic SIADH: AY+301-601 this admission,up to 123. Management per primary.  7. Abnormal thyroid function: TSH low-normal, elevated FT4 consistent with subclinical hyperthyroidism which could be driving HR (increased risk of AF with subclinical hypothyroidism). Further per IM.  8. [name]: See cath discussion above with occluded LAD and RCA with extensive collateralization. No anginal pain. Medical therapy with ASA, beta blockers, and statin.    For questions or updates, please contact [place] Please consult [email or web address] for contact info  under Cardiology/STEMI.      Signed, Kate Sable, MD  11/23/2017, 9:10 AM

## 2017-11-24 DIAGNOSIS — I5041 Acute combined systolic (congestive) and diastolic (congestive) heart failure: Secondary | ICD-10-CM

## 2017-11-24 LAB — CBC
HCT: 37.5 % — ABNORMAL LOW (ref 39.0–52.0)
Hemoglobin: 12.9 g/dL — ABNORMAL LOW (ref 13.0–17.0)
MCH: 30.7 pg (ref 26.0–34.0)
MCHC: 34.4 g/dL (ref 30.0–36.0)
MCV: 89.3 fL (ref 78.0–100.0)
Platelets: 243 10*3/uL (ref 150–400)
RBC: 4.2 MIL/uL — ABNORMAL LOW (ref 4.22–5.81)
RDW: 13.3 % (ref 11.5–15.5)
WBC: 11.1 10*3/uL — ABNORMAL HIGH (ref 4.0–10.5)

## 2017-11-24 LAB — PROTIME-INR
INR: 3.23
Prothrombin Time: 32.7 seconds — ABNORMAL HIGH (ref 11.4–15.2)

## 2017-11-24 LAB — BASIC METABOLIC PANEL
Anion gap: 10 (ref 5–15)
BUN: 21 mg/dL — ABNORMAL HIGH (ref 6–20)
CO2: 26 mmol/L (ref 22–32)
Calcium: 8 mg/dL — ABNORMAL LOW (ref 8.9–10.3)
Chloride: 88 mmol/L — ABNORMAL LOW (ref 101–111)
Creatinine, Ser: 0.84 mg/dL (ref 0.61–1.24)
GFR calc Af Amer: >60 mL/min (ref >60)
GFR calc non Af Amer: >60 mL/min (ref >60)
Glucose, Bld: 95 mg/dL (ref 65–99)
Potassium: 3.9 mmol/L (ref 3.5–5.1)
Sodium: 124 mmol/L — ABNORMAL LOW (ref 135–145)

## 2017-11-24 LAB — MAGNESIUM: Magnesium: 2 mg/dL (ref 1.7–2.4)

## 2017-11-24 LAB — BPAM FFP
Blood Product Expiration Date: 201901142359
ISSUE DATE / TIME: 201901091419
Unit Type and Rh: 8400

## 2017-11-24 LAB — PREPARE FRESH FROZEN PLASMA: Unit division: 0

## 2017-11-24 MED ORDER — FUROSEMIDE 40 MG PO TABS
40.0000 mg | ORAL_TABLET | Freq: Every day | ORAL | Status: DC
  Administered 2017-11-24 – 2017-11-26 (×3): 40 mg via ORAL
  Filled 2017-11-24 (×3): qty 1

## 2017-11-24 MED ORDER — ACETAMINOPHEN 325 MG PO TABS: 650.0000 mg | ORAL_TABLET | ORAL | Status: DC | PRN

## 2017-11-24 MED ORDER — METOPROLOL SUCCINATE ER 50 MG PO TB24
75.0000 mg | ORAL_TABLET | Freq: Every day | ORAL | Status: DC
  Administered 2017-11-25 – 2017-11-27 (×3): 75 mg via ORAL
  Filled 2017-11-24 (×4): qty 1

## 2017-11-24 NOTE — Care Management Important Message (Signed)
Important Message  Patient Details  Name: Ryan Savage MRN: [id number] Date of Birth: 04-26-1929   Medicare Important Message Given:  Yes    Estel Tonelli 11/24/2017, 4:07 PM

## 2017-11-24 NOTE — Progress Notes (Signed)
[facility] - Stepdown/ICU TEAM  Sander Grandville Silos  ZJI:[id number] DOB: 03-12-1929 DOA: Savage/06/2018 PCP: Lajean Manes, MD    Brief Narrative:  84 y.o.M w/ a Hx of SIADH, OSA not on CPAP, MVP, HTN, HLD, thrombocytopenia, and afib on Coumadin who presented withSOB, weakness, chest congestionand coughing.He reported that 3 weeks prior he was in [employer] and walking in the park. Now, he can't even take the trash can to the street.   Workup in the ED revealed and acute new CHF exacerbation and hyponatremia.   Significant Events: Savage/8 admit Savage/9 TTE - EF 35% - septal and apical akinesis - inferior wall hypokinesis - mod mitral regurg - RA severely dilated  Savage/11 L/R heart cath - severe 2 vessel [name] w/ chronic total occlusion LAD and RCA  Subjective: Resting comfortably in a bedside chair.  Denies cp, sob, n/v, or abdom pain.  Is highly motivated to get moving more.    Assessment & Plan:  Acute Systolic CHF / cardiomyopathy titrating diuretic tx and educating on following weight at home - he will prob be a good candidate for prn diuretic +/- scheduled diuretic based upon a target weight as he is highly motivated to take care of himself    Severe 2 vessel [name] medical management - Cardiology following   Chronic Atrial fibrillation w/ acute RVR CHA2DS2-VASc Scoreis >2 - rate controlled   Mitral valve prolapse - moderate MR Per Cardiology   Essential HTN  BP well controlled   Severe Hyponatremia - ?SIADH Pt states he is unaware of a hx of SIADH - reports he has not been told to restrict his fluid intake - unclear if he truly has SIADH or not - can not evaluate effectively at this time as he is on diuretics - would cont to tx CHF and follow Na - Na 134 in 2014 - Na 118 at presentation   Hyperglycemia Resolved  Subclinical Hyperthyroidism? TSH is normal - free T4 is modestly elevated - check T3 - check cortisol   DVT prophylaxis: heparin gtt  Code Status: FULL  CODE Family Communication: spoke w/ wife at bedside   Disposition Plan: PT/OT - for eventual d/c home   Consultants:  Cardiology   Antimicrobials:  none   Objective: Blood pressure 115/87, pulse (!) 103, temperature (!) 97.4 F (36.3 C), temperature source Oral, resp. rate 12, height 5\' 10"  (Savage.778 m), weight 83.9 kg (185 lb), SpO2 94 %.  Intake/Output Summary (Last 24 hours) at Savage/14/2019 1412 Last data filed at Savage/14/2019 0900 Gross per 24 hour  Intake 240 ml  Output 385 ml  Net -145 ml   Filed Weights   11/22/17 0333 11/23/17 0352 11/24/17 0443  Weight: 82.3 kg (181 lb 6.4 oz) 83.2 kg (183 lb 6.4 oz) 83.9 kg (185 lb)    Examination: General: No acute respiratory distress Lungs: Clear to auscultation bilaterally without wheezes or crackles Cardiovascular: irreg irreg - no M or rub appreciated  Abdomen: Nontender, nondistended, soft, bowel sounds positive, no rebound, no ascites, no appreciable mass Extremities: trace B LE edema   CBC: Recent Labs  Lab 11/18/17 2350 11/19/17 0556 11/22/17 0150 11/23/17 0420 11/24/17 0429  WBC 12.Savage* 15.5* 13.4* 13.2* 11.Savage*  NEUTROABS 10.3*  --   --   --   --   HGB 12.3* 11.7* 13.4 13.3 12.9*  HCT 34.7* 34.3* 38.3* 38.0* 37.5*  MCV 87.8 87.7 88.7 88.6 89.3  PLT 324 336 264 260 175   Basic Metabolic Panel:  Recent Labs  Lab 11/20/17 0221  11/21/17 0125  11/21/17 1651 11/22/17 0150 11/22/17 0800 11/23/17 0420 11/24/17 0429  NA 119*   < > 120*   < > 124* 122*  123* 123* 123* 124*  K 4.Savage  --  3.8  --   --  3.2*  --  3.9 3.9  CL 83*  --  79*  --   --  85*  --  87* 88*  CO2 26  --  29  --   --  27  --  25 26  GLUCOSE 133*  --  127*  --   --  110*  --  101* 95  BUN 24*  --  24*  --   --  29*  --  22* 21*  CREATININE 0.89  --  0.95  --   --  0.89  --  0.85 0.84  CALCIUM 7.9*  --  7.8*  --   --  7.8*  --  8.0* 8.0*  MG Savage.8  --  2.0  --   --  Savage.9  --  Savage.8 2.0   < > = values in this interval not displayed.   GFR: Estimated  Creatinine Clearance: 65.2 mL/min (by C-G formula based on SCr of 0.84 mg/dL).  Liver Function Tests: No results for input(s): AST, ALT, ALKPHOS, BILITOT, PROT, ALBUMIN in the last 168 hours. No results for input(s): LIPASE, AMYLASE in the last 168 hours. No results for input(s): AMMONIA in the last 168 hours.  Coagulation Profile: Recent Labs  Lab 11/21/17 0125 11/21/17 1216 11/22/17 0150 11/23/17 0420 11/24/17 0429  INR Savage.92 Savage.68 Savage.77 2.55 3.23    Cardiac Enzymes: Recent Labs  Lab 11/18/17 1537 11/18/17 2350 11/19/17 0232  TROPONINI 0.09* 0.08* 0.09*    CBG: Recent Labs  Lab 11/22/17 0629  GLUCAP 127*    Recent Results (from the past 240 hour(s))  Respiratory Panel by PCR     Status: None   Collection Time: 11/18/17  8:55 AM  Result Value Ref Range Status   Adenovirus NOT DETECTED NOT DETECTED Final   Coronavirus 229E NOT DETECTED NOT DETECTED Final   Coronavirus HKU1 NOT DETECTED NOT DETECTED Final   Coronavirus NL63 NOT DETECTED NOT DETECTED Final   Coronavirus OC43 NOT DETECTED NOT DETECTED Final   Metapneumovirus NOT DETECTED NOT DETECTED Final   Rhinovirus / Enterovirus NOT DETECTED NOT DETECTED Final   Influenza A NOT DETECTED NOT DETECTED Final   Influenza B NOT DETECTED NOT DETECTED Final   Parainfluenza Virus Savage NOT DETECTED NOT DETECTED Final   Parainfluenza Virus 2 NOT DETECTED NOT DETECTED Final   Parainfluenza Virus 3 NOT DETECTED NOT DETECTED Final   Parainfluenza Virus 4 NOT DETECTED NOT DETECTED Final   Respiratory Syncytial Virus NOT DETECTED NOT DETECTED Final   Bordetella pertussis NOT DETECTED NOT DETECTED Final   Chlamydophila pneumoniae NOT DETECTED NOT DETECTED Final   Mycoplasma pneumoniae NOT DETECTED NOT DETECTED Final     Scheduled Meds: . aspirin EC  81 mg Oral Daily  . atorvastatin  20 mg Oral q1800  . digoxin  0.125 mg Oral Daily  . docusate sodium  200 mg Oral QHS  . finasteride  5 mg Oral Daily  . [START ON Savage/15/2019]  metoprolol succinate  75 mg Oral Daily  . sodium chloride flush  3 mL Intravenous Q12H  . sodium chloride flush  3 mL Intravenous Q12H  . Warfarin - Pharmacist Dosing Inpatient   Does not  apply q1800     LOS: 6 days   Cherene Altes, MD [facility] Office  [phone] Pager - Text Page per Amion as per below:  On-Call/Text Page:      [email or web address]      password [email or web address]  If 7PM-7AM, please contact night-coverage [email or web address] Password [email or web address] Savage/14/2019, 2:12 PM

## 2017-11-24 NOTE — Progress Notes (Signed)
ANTICOAGULATION CONSULT NOTE - Follow Up Consult  Pharmacy Consult for Warfarin Indication: atrial fibrillation  Allergies  Allergen Reactions  . Novocain [Procaine] Rash    Patient Measurements: Height: 5\' 10"  (177.8 cm) Weight: 185 lb (83.9 kg) IBW/kg (Calculated) : 73 Heparin Dosing Weight: 86.7 kg  Vital Signs: Temp: 97.4 F (36.3 C) (01/14 0443) Temp Source: Oral (01/14 0443) BP: 122/85 (01/14 0508) Pulse Rate: 90 (01/14 0508)  Labs: Recent Labs    11/22/17 0150 11/22/17 0800 11/22/17 1856 11/23/17 0420 11/24/17 0429  HGB 13.4  --   --  13.3 12.9*  HCT 38.3*  --   --  38.0* 37.5*  PLT 264  --   --  260 243  LABPROT 20.5*  --   --  27.3* 32.7*  INR 1.77  --   --  2.55 3.23  HEPARINUNFRC  --  0.11* 0.40 0.57  --   CREATININE 0.89  --   --  0.85 0.84    Assessment: 48 yom presented to ED on 1/8 with SOB.  Hx of Afib on warfarin PTA, INR >10 on admit. INR reversed with vitamin k (5mg  on 1/8 and 2.5mg  x2 on 1/10) pending cath. Heparin bridge started 1/11 when INR fell below 2. Underwent cath 1/11 and heparin resumed post-cath as bridge to warfarin.   Heparin d/c'ed yesterday with therapeutic INR. INR continues to trend up and is now above goal 3.23. CBC stable. No bleeding reported.  PTA dose: 4mg  x 1 day, 3.5mg  x 2 days, then repeat cycle - last dose 4mg  on 1/7  Goal of Therapy:  Heparin level 0.3-0.7 units/ml Monitor platelets by anticoagulation protocol: Yes   Plan:  1) Hold warfarin tonight 2) Daily INR  Nena Jordan, PharmD, BCPS 11/24/2017 9:16 AM

## 2017-11-24 NOTE — Progress Notes (Signed)
Blood bank called to inquiry about an order for one unit of fresh frozen plasma. This RN found out that the FFP was to reverse the elevated INR of 10. INR is now within desired range; 2.55. This RN asked blood bank technician to hold until this AM to allow for MD evaluation first. Will continue to monitor.

## 2017-11-24 NOTE — Progress Notes (Signed)
Progress Note  Patient Name: Ryan Savage Date of Encounter: 11/24/2017  Primary Cardiologist: [name]  Subjective   Pt feeling well this AM. Reports his nasal/sinus drainage has improved. Denies CP, SOB, palpitations.   Inpatient Medications    Scheduled Meds: . aspirin EC  81 mg Oral Daily  . atorvastatin  20 mg Oral q1800  . digoxin  0.125 mg Oral Daily  . docusate sodium  200 mg Oral QHS  . finasteride  5 mg Oral Daily  . metoprolol succinate  50 mg Oral Daily  . sodium chloride flush  3 mL Intravenous Q12H  . sodium chloride flush  3 mL Intravenous Q12H  . Warfarin - Pharmacist Dosing Inpatient   Does not apply q1800   Continuous Infusions: . sodium chloride    . sodium chloride    . sodium chloride     PRN Meds: sodium chloride, sodium chloride, acetaminophen, alum & mag hydroxide-simeth, guaiFENesin-dextromethorphan, lip balm, loratadine, magnesium hydroxide, MUSCLE RUB, ondansetron (ZOFRAN) IV, phenol, polyvinyl alcohol, sodium chloride, sodium chloride flush, sodium chloride flush   Vital Signs    Vitals:   11/23/17 1237 11/23/17 2102 11/24/17 0443 11/24/17 0508  BP: 106/79 109/90 115/83 122/85  Pulse: 99   90  Resp: 20   19  Temp: 97.9 F (36.6 C) (!) 97.5 F (36.4 C) (!) 97.4 F (36.3 C)   TempSrc: Oral Oral Oral   SpO2: 94%   94%  Weight:   185 lb (83.9 kg)   Height:        Intake/Output Summary (Last 24 hours) at 11/24/2017 0857 Last data filed at 11/24/2017 0515 Gross per 24 hour  Intake 0 ml  Output 385 ml  Net -385 ml   Filed Weights   11/22/17 0333 11/23/17 0352 11/24/17 0443  Weight: 181 lb 6.4 oz (82.3 kg) 183 lb 6.4 oz (83.2 kg) 185 lb (83.9 kg)    Physical Exam   General: Elderly, well developed, well nourished, NAD Skin: Warm, dry, intact  Head: Normocephalic, atraumatic, clear, moist mucus membranes. Neck: Negative for carotid bruits. No JVD Lungs:Clear to ausculation bilaterally. No wheezes, rales, or rhonchi.  Breathing is unlabored. Cardiovascular: Irregular with S1 S2. No murmurs, rubs, or gallops Abdomen: Soft, non-tender, non-distended with normoactive bowel sounds. No obvious abdominal masses. MSK: Strength and tone appear normal for age. 5/5 in all extremities Extremities: No edema. No clubbing or cyanosis. DP/PT pulses 2+ bilaterally Neuro: Alert and oriented. No focal deficits. No facial asymmetry. MAE spontaneously. Psych: Responds to questions appropriately with normal affect.    Labs    Chemistry Recent Labs  Lab 11/22/17 0150 11/22/17 0800 11/23/17 0420 11/24/17 0429  NA 122*  123* 123* 123* 124*  K 3.2*  --  3.9 3.9  CL 85*  --  87* 88*  CO2 27  --  25 26  GLUCOSE 110*  --  101* 95  BUN 29*  --  22* 21*  CREATININE 0.89  --  0.85 0.84  CALCIUM 7.8*  --  8.0* 8.0*  GFRNONAA >60  --  >60 >60  GFRAA >60  --  >60 >60  ANIONGAP 10  --  11 10     Hematology Recent Labs  Lab 11/22/17 0150 11/23/17 0420 11/24/17 0429  WBC 13.4* 13.2* 11.1*  RBC 4.32 4.29 4.20*  HGB 13.4 13.3 12.9*  HCT 38.3* 38.0* 37.5*  MCV 88.7 88.6 89.3  MCH 31.0 31.0 30.7  MCHC 35.0 35.0 34.4  RDW  13.2 13.1 13.3  PLT 264 260 243    Cardiac Enzymes Recent Labs  Lab 11/18/17 1537 11/18/17 2350 11/19/17 0232  TROPONINI 0.09* 0.08* 0.09*    Recent Labs  Lab 11/18/17 0626 11/18/17 1017  TROPIPOC 0.08 0.09*     BNP Recent Labs  Lab 11/18/17 0855  BNP 1,421.3*     DDimer No results for input(s): DDIMER in the last 168 hours.   Radiology    No results found.  Telemetry    11/24/17 Afib HR 91 - Personally Reviewed//  ECG     11/19/17 Afib HR 97- Personally Reviewed  Cardiac Studies   Cath (11/21/17):  1. 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 Patent left main and circumflex/intermediate branches 3. Low right and left sided intracardiac filling pressures 4. Known severe LV systolic  dysfunction  Recommend:  Medical therapy for [name]. Reviewed films with colleagues and we agree CTO-intervention would have a low likelihood of success in either the LAD or RCA secondary to anatomic features of heavy calcification and lesion length in the RCA and heavy calcification and nebulous stump in the LAD  Resume warfarin tonight  With low filling pressures, hold diuretics x 24 hours and resume oral diuretics thereafter  Mitral regurgitation by most recent echo appears only mild-moderate. Cath shows no significant V-waves. Doubt mitral regurgitation is playing a major role at present.  TTE: 11/19/16  Study Conclusions  - Left ventricle: Septal and apical akinesis Inferior wall hypokinesis. The estimated ejection fraction was 35%. The study is not technically sufficient to allow evaluation of LV diastolic function. - Aortic valve: There was mild regurgitation. - Mitral valve: There was mild to moderate regurgitation. - Left atrium: The atrium was moderately dilated. - Right atrium: The atrium was severely dilated. - Atrial septum: No defect or patent foramen ovale was identified.  Patient Profile     84 y.o. 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 Found to have EF of 35%.  Assessment & Plan    1. Acute combined systolic and diastolic heart failure/ cardiomyopathy: -Weight, 185lb, down 5 lb since admission (190>185>181>183>185) -I&O, negative 247ml (net negative 1.2L since admission) -BP remains relatively soft, 122/85>115/83>109/90 -FFP 1 unit for elevated INR of 10 on admission >>>2.55>> 3.23 today -BP too low for ACEI/ARB -Would no restart Lasix today given his Na+ levels and no outward s/s of fluid volume overload  2. Chronic atrial fibrillation with RVR:  -On hep gtt, was on coumadin for OAC -Rate elevated on admission, may  need additional rate control be on current dose of digoxin and -- given Vit K due to supratherapeutic INR -Dilt stopped given low EF per echo> placed on Toprol XL 25mg >> will further titrate to 75 mg  -Digoxin 0.125mg  daily started 11/22/17>> with an extra dose of 0.25mg  given 11/23/17.  3. MV prolapse with moderate MR: -Per echo from 10/2016> reassessed this admission> revealed mild to moderate regurg  4. HTN: -Stable, but softer this admission, 115/87>122/85>115/83>109/90  5. HL: -Statin  6. Hyponatremia/ SIADH: -Per IM -Na+, 124 today  -Hesitant to restart Lasix today given his Na+ levels and no outward s/s of fluid volume overlaod  7. [name]: -Per cardiac cath 11/21/17 with severe 2-vessel disease occluded LAD and RCA with extensive collaterals).  -Denies CP -To treat medically with ASA, BB, statin  Signed, Kathyrn Drown, NP   11/24/2017, 8:57 AM   Pager: [phone]  For questions or updates, please contact   Please consult [email or web address] for contact info under Cardiology/STEMI.  I have seen, examined and evaluated the patient this PM along with Kathyrn Drown, NP .  After reviewing all the available data and chart, we discussed the patients laboratory, study & physical findings as well as symptoms in detail. I agree with her findings, examination as well as impression recommendations as per our discussion.    Attending adjustments noted in italics.   Overall, he seems to be gradually getting better.  I agree with not restarting his diuretic yet because of hyponatremia with SIADH.  May consider restarting tomorrow to avoid further shortness of breath.  Rate control is still difficult.  I have titrate his Toprol up to 75 mg along with his digoxin.  Diltiazem is not favorable given his reduced ejection fraction.  He remains on IV heparin and warfarin per pharmacy.  Mitral valve appears to be less significant on current echo.  He has severe multivessel disease with occluded  RCA and LAD.  Lesions are not favorable for revascularization and remains on aspirin, beta-blockers and statin.  No active anginal symptoms.  I suspect that with his multivessel [name] is new combined systolic and diastolic heart failure will be more prominent and he would likely have more exertional dyspnea.  Blood pressure currently not able to tolerate addition of afterload reduction with ARB. Once rate is controlled with adequate beta blocker dosing, we can then work to initiate afterload reduction.    Glenetta Hew, M.D., M.S. Interventional Cardiologist   Pager # [phone] Phone # [phone] [address] [place], [place] [address]

## 2017-11-25 DIAGNOSIS — I5043 Acute on chronic combined systolic (congestive) and diastolic (congestive) heart failure: Secondary | ICD-10-CM

## 2017-11-25 DIAGNOSIS — I255 Ischemic cardiomyopathy: Secondary | ICD-10-CM

## 2017-11-25 DIAGNOSIS — I25110 Atherosclerotic heart disease of native coronary artery with unstable angina pectoris: Secondary | ICD-10-CM

## 2017-11-25 LAB — CBC
HCT: 38.4 % — ABNORMAL LOW (ref 39.0–52.0)
Hemoglobin: 13.4 g/dL (ref 13.0–17.0)
MCH: 31.1 pg (ref 26.0–34.0)
MCHC: 34.9 g/dL (ref 30.0–36.0)
MCV: 89.1 fL (ref 78.0–100.0)
Platelets: 217 10*3/uL (ref 150–400)
RBC: 4.31 MIL/uL (ref 4.22–5.81)
RDW: 13.3 % (ref 11.5–15.5)
WBC: 11.2 10*3/uL — ABNORMAL HIGH (ref 4.0–10.5)

## 2017-11-25 LAB — COMPREHENSIVE METABOLIC PANEL
ALT: 27 U/L (ref 17–63)
AST: 27 U/L (ref 15–41)
Albumin: 2.6 g/dL — ABNORMAL LOW (ref 3.5–5.0)
Alkaline Phosphatase: 67 U/L (ref 38–126)
Anion gap: 11 (ref 5–15)
BUN: 18 mg/dL (ref 6–20)
CO2: 29 mmol/L (ref 22–32)
Calcium: 8.2 mg/dL — ABNORMAL LOW (ref 8.9–10.3)
Chloride: 87 mmol/L — ABNORMAL LOW (ref 101–111)
Creatinine, Ser: 0.99 mg/dL (ref 0.61–1.24)
GFR calc Af Amer: >60 mL/min (ref >60)
GFR calc non Af Amer: >60 mL/min (ref >60)
Glucose, Bld: 107 mg/dL — ABNORMAL HIGH (ref 65–99)
Potassium: 4 mmol/L (ref 3.5–5.1)
Sodium: 127 mmol/L — ABNORMAL LOW (ref 135–145)
Total Bilirubin: 1.3 mg/dL — ABNORMAL HIGH (ref 0.3–1.2)
Total Protein: 5.8 g/dL — ABNORMAL LOW (ref 6.5–8.1)

## 2017-11-25 LAB — PROTIME-INR
INR: 3.42
Prothrombin Time: 34.3 seconds — ABNORMAL HIGH (ref 11.4–15.2)

## 2017-11-25 LAB — CORTISOL: Cortisol, Plasma: 20.5 ug/dL

## 2017-11-25 MED ORDER — SENNOSIDES-DOCUSATE SODIUM 8.6-50 MG PO TABS
1.0000 | ORAL_TABLET | Freq: Two times a day (BID) | ORAL | Status: DC
  Administered 2017-11-25 – 2017-11-26 (×3): 1 via ORAL
  Filled 2017-11-25 (×5): qty 1

## 2017-11-25 MED ORDER — MAGNESIUM CITRATE PO SOLN: 0.5000 | Freq: Once | ORAL | Status: DC | PRN

## 2017-11-25 MED ORDER — BISACODYL 10 MG RE SUPP
10.0000 mg | Freq: Every day | RECTAL | Status: DC | PRN
Start: 2017-11-25 — End: 2017-11-27

## 2017-11-25 MED ORDER — LACTULOSE 10 GM/15ML PO SOLN: 10.0000 g | Freq: Two times a day (BID) | ORAL | Status: DC | PRN

## 2017-11-25 NOTE — Evaluation (Signed)
Occupational Therapy Evaluation Patient Details Name: Ryan Savage MRN: [id number] DOB: [birth year] Today's Date: 11/25/2017    History of Present Illness Pt is an 84 yo male admitted with acute CHF exacerbation and hyponatremia.  Pt has a h/o afib, HTN and presented with SOB, chest congestion.    Clinical Impression   Pt admitted with the above diagnosis and overall is doing very well. Pt would benefit from OT to educate pt on energy conservation techniques he can implement at home while recovering from his hospital stay.  Wife is available to assist but pt is overall supervision with most adls and mobility.     Follow Up Recommendations  No OT follow up;Supervision - Intermittent    Equipment Recommendations  None recommended by OT    Recommendations for Other Services       Precautions / Restrictions Restrictions Weight Bearing Restrictions: No      Mobility Bed Mobility Overal bed mobility: Modified Independent             General bed mobility comments: used bedrail.  Transfers Overall transfer level: Needs assistance Equipment used: Rolling walker (2 wheeled) Transfers: Sit to/from Stand Sit to Stand: Supervision         General transfer comment: Pt required cues for hand placement when standing at walker.    Balance Overall balance assessment: No apparent balance deficits (not formally assessed)                                         ADL either performed or assessed with clinical judgement   ADL Overall ADL's : Needs assistance/impaired Eating/Feeding: Independent;Sitting   Grooming: Wash/dry hands;Wash/dry face;Oral care;Supervision/safety;Standing   Upper Body Bathing: Set up;Sitting   Lower Body Bathing: Set up;Sit to/from stand   Upper Body Dressing : Set up;Sitting   Lower Body Dressing: Set up;Sit to/from stand   Toilet Transfer: Supervision/safety;Ambulation   Toileting- Clothing Manipulation and Hygiene:  Supervision/safety;Sit to/from stand   Tub/ Shower Transfer: Walk-in shower;Supervision/safety;Ambulation   Functional mobility during ADLs: Supervision/safety General ADL Comments: Pt only requiring S for adls. Pt with HR up to 130 after PT saw pt.  Will do some energy conservation education on next visit.     Vision Baseline Vision/History: Wears glasses Wears Glasses: At all times Patient Visual Report: No change from baseline Vision Assessment?: No apparent visual deficits     Perception Perception Perception Tested?: No   Praxis Praxis Praxis tested?: Not tested    Pertinent Vitals/Pain Pain Assessment: No/denies pain     Hand Dominance Right   Extremity/Trunk Assessment Upper Extremity Assessment Upper Extremity Assessment: Overall WFL for tasks assessed   Lower Extremity Assessment Lower Extremity Assessment: Defer to PT evaluation   Cervical / Trunk Assessment Cervical / Trunk Assessment: Normal   Communication Communication Communication: No difficulties   Cognition Arousal/Alertness: Awake/alert Behavior During Therapy: WFL for tasks assessed/performed Overall Cognitive Status: Within Functional Limits for tasks assessed                                     General Comments  Pt doing very well.  Still slightly SOB.  HR up after PT session which followed OT session.    Exercises     Shoulder Instructions      Home Living Family/patient expects to be discharged  to:: Private residence Living Arrangements: Spouse/significant other Available Help at Discharge: Family;Available 24 hours/day Type of Home: House Home Access: Stairs to enter [employer] of Steps: 6   Home Layout: One level     Bathroom Shower/Tub: Walk-in shower;Door   Bathroom Toilet: Standard     Home Equipment: None   Additional Comments: Pt very active with walking and aerobic exercises.      Prior Functioning/Environment Level of Independence:  Independent        Comments: Pt drives. Wife active as well.        OT Problem List: Decreased knowledge of use of DME or AE;Cardiopulmonary status limiting activity      OT Treatment/Interventions: Self-care/ADL training;Energy conservation    OT Goals(Current goals can be found in the care plan section) Acute Rehab OT Goals Patient Stated Goal: to get my appetite and energy back OT Goal Formulation: With patient Time For Goal Achievement: 12/02/17 Potential to Achieve Goals: Good ADL Goals Additional ADL Goal #1: Pt will state 3 things he can do differently at home to attempt to conserve energy as he recovers and gets back to feeling baseline with no cues.  OT Frequency: Min 2X/week   Barriers to D/C:            Co-evaluation              AM-PAC PT "6 Clicks" Daily Activity     Outcome Measure Help from another person eating meals?: None Help from another person taking care of personal grooming?: None Help from another person toileting, which includes using toliet, bedpan, or urinal?: None Help from another person bathing (including washing, rinsing, drying)?: None Help from another person to put on and taking off regular upper body clothing?: None Help from another person to put on and taking off regular lower body clothing?: A Little 6 Click Score: 23   End of Session Equipment Utilized During Treatment: Rolling walker Nurse Communication: Mobility status  Activity Tolerance: Patient tolerated treatment well Patient left: Other (comment)(with PT)  OT Visit Diagnosis: Other (comment)(SOB/high HR)                Time: 1001-1016 OT Time Calculation (min): 15 min Charges:  OT General Charges $OT Visit: 1 Visit OT Evaluation $OT Eval Low Complexity: 1 Low G-Codes:     Jinger Neighbors, OTR/L  Glenford Peers 11/25/2017, 10:28 AM

## 2017-11-25 NOTE — Progress Notes (Signed)
ANTICOAGULATION CONSULT NOTE - Follow Up Consult  Pharmacy Consult for Warfarin Indication: atrial fibrillation  Allergies  Allergen Reactions  . Novocain [Procaine] Rash    Patient Measurements: Height: 5\' 10"  (177.8 cm) Weight: 178 lb 8 oz (81 kg) IBW/kg (Calculated) : 73 Heparin Dosing Weight: 86.7 kg  Vital Signs: Temp: 97.3 F (36.3 C) (01/15 0305) Temp Source: Oral (01/15 0305) BP: 116/78 (01/15 0305) Pulse Rate: 87 (01/15 0500)  Labs: Recent Labs    11/22/17 1856  11/23/17 0420 11/24/17 0429 11/25/17 0335  HGB  --    < > 13.3 12.9* 13.4  HCT  --   --  38.0* 37.5* 38.4*  PLT  --   --  260 243 217  LABPROT  --   --  27.3* 32.7* 34.3*  INR  --   --  2.55 3.23 3.42  HEPARINUNFRC 0.40  --  0.57  --   --   CREATININE  --   --  0.85 0.84 0.99   < > = values in this interval not displayed.    Assessment: 52 yom presented to ED on 1/8 with SOB.  Hx of Afib on warfarin PTA, INR >10 on admit. INR reversed with vitamin k (5mg  on 1/8 and 2.5mg  x2 on 1/10) pending cath. Heparin bridge started 1/11 when INR fell below 2. Underwent cath 1/11 and heparin resumed post-cath as bridge to warfarin.   Heparin d/c'ed 1/13 with therapeutic INR. Warfarin dose held last night and INR continues to trend up to 3.42. CBC stable. No bleeding.  PTA dose: 4mg  x 1 day, 3.5mg  x 2 days, then repeat cycle - last dose 4mg  on 1/7  Goal of Therapy:  Heparin level 0.3-0.7 units/ml Monitor platelets by anticoagulation protocol: Yes   Plan:  1) Hold warfarin again tonight 2) Daily INR  Nena Jordan, PharmD, BCPS 11/25/2017 11:35 AM

## 2017-11-25 NOTE — Evaluation (Signed)
Physical Therapy Evaluation Patient Details Name: Lowell Makara MRN: [id number] DOB: Jun 01, 1929 Today's Date: 11/25/2017   History of Present Illness  Pt is an 84 yo male admitted 11/18/17 with SOB and chest congestion; worked up for acute CHF exacerbation and hyponatremia. L/R heart cath 1/11 showed severe 2 vessel [name] w/ chronic total occlusion LAD and RCA. PMH includes a-fib, HTN, CHF, thrombocytopenia, OSA, [name].     Clinical Impression  Pt presents with an overall decrease in functional mobility secondary to above. PTA, pt indep and lives at home with wife; pt remains active and participates in [place] exercise classes. Today, pt able to ambulate and initiate stair training with supervision for safety; HR up to 131 with activity. From a mobility perspective, feel pt is safe to return home with HHPT and initial supervision from wife. Will continue to follow acutely to maximize functional mobility and independence prior to return home.     Follow Up Recommendations Home health PT;Supervision - Intermittent    Equipment Recommendations  None recommended by PT    Recommendations for Other Services       Precautions / Restrictions Precautions Precautions: None Restrictions Weight Bearing Restrictions: No      Mobility  Bed Mobility Overal bed mobility: Modified Independent             General bed mobility comments: Received ambulating with OT; left sitting in recliner  Transfers Overall transfer level: Needs assistance Equipment used: None Transfers: Sit to/from Stand Sit to Stand: Supervision         General transfer comment: Pt required cues for hand placement when standing at walker.  Ambulation/Gait Ambulation/Gait assistance: Supervision Ambulation Distance (Feet): 300 Feet Assistive device: None Gait Pattern/deviations: Step-through pattern;Decreased stride length Gait velocity: Decreased   General Gait Details: Slow, controlled ambulation with  supervision for safety. HR up to 131   Stairs Stairs: Yes Stairs assistance: Supervision Stair Management: One rail Right;Alternating pattern;Forwards Number of Stairs: 11 General stair comments: Ascend/descend steps with single rail support; supervision for safety  Wheelchair Mobility    Modified Rankin (Stroke Patients Only)       Balance Overall balance assessment: Needs assistance   Sitting balance-Leahy Scale: Good       Standing balance-Leahy Scale: Good               High level balance activites: Side stepping;Backward walking;Direction changes;Turns;Sudden stops High Level Balance Comments: Initial drift R/L ambulating without RW, but pt able to self-correct with no LOB             Pertinent Vitals/Pain Pain Assessment: No/denies pain    Home Living Family/patient expects to be discharged to:: Private residence Living Arrangements: Spouse/significant other Available Help at Discharge: Family;Available 24 hours/day Type of Home: House Home Access: Stairs to enter Entrance Stairs-Rails: Psychiatric nurse of Steps: 6 Home Layout: One level Home Equipment: None Additional Comments: Pt very active with walking and aerobic exercises.    Prior Function Level of Independence: Independent         Comments: Pt drives. Wife active as well. Both walk regularly and participate in [place] program     Hand Dominance   Dominant Hand: Right    Extremity/Trunk Assessment   Upper Extremity Assessment Upper Extremity Assessment: Overall WFL for tasks assessed    Lower Extremity Assessment Lower Extremity Assessment: Overall WFL for tasks assessed    Cervical / Trunk Assessment Cervical / Trunk Assessment: Normal  Communication   Communication: No difficulties  Cognition Arousal/Alertness: Awake/alert Behavior During Therapy: WFL for tasks assessed/performed Overall Cognitive Status: Within Functional Limits for tasks  assessed                                        General Comments General comments (skin integrity, edema, etc.): Pt doing very well.  Still slightly SOB.  HR up after PT session which followed OT session.    Exercises     Assessment/Plan    PT Assessment Patient needs continued PT services  PT Problem List Decreased activity tolerance;Decreased balance;Decreased mobility       PT Treatment Interventions DME instruction;Gait training;Stair training;Functional mobility training;Therapeutic activities;Therapeutic exercise;Balance training;Patient/family education    PT Goals (Current goals can be found in the Care Plan section)  Acute Rehab PT Goals Patient Stated Goal: to get my appetite and energy back PT Goal Formulation: With patient Time For Goal Achievement: 12/09/17 Potential to Achieve Goals: Good    Frequency Min 3X/week   Barriers to discharge        Co-evaluation               AM-PAC PT "6 Clicks" Daily Activity  Outcome Measure Difficulty turning over in bed (including adjusting bedclothes, sheets and blankets)?: None Difficulty moving from lying on back to sitting on the side of the bed? : None Difficulty sitting down on and standing up from a chair with arms (e.g., wheelchair, bedside commode, etc,.)?: None Help needed moving to and from a bed to chair (including a wheelchair)?: A Little Help needed walking in hospital room?: A Little Help needed climbing 3-5 steps with a railing? : A Little 6 Click Score: 21    End of Session Equipment Utilized During Treatment: Gait belt Activity Tolerance: Patient tolerated treatment well Patient left: in chair;with call bell/phone within reach Nurse Communication: Mobility status PT Visit Diagnosis: Other abnormalities of gait and mobility (R26.89)    Time: 1015-1030 PT Time Calculation (min) (ACUTE ONLY): 15 min   Charges:   PT Evaluation $PT Eval Low Complexity: 1 Low     PT G Codes:        Mabeline Caras, PT, DPT Acute Rehab Services  Pager: [place] 11/25/2017, 11:10 AM

## 2017-11-25 NOTE — Progress Notes (Signed)
[facility] TEAM 1 - Stepdown/ICU TEAM  Casey Grandville Silos  QMV:[id number] DOB: [birth year] DOA: 11/18/2017 PCP: Lajean Manes, MD    Brief Narrative:  84 y.o.M w/ a Hx of SIADH, OSA not on CPAP, MVP, HTN, HLD, thrombocytopenia, and afib on Coumadin who presented withSOB, weakness, chest congestionand coughing.He reported that 3 weeks prior he was in [employer] and walking in the park. Now, he can't even take the trash can to the street.   Workup in the ED revealed and acute new CHF exacerbation and hyponatremia.   Significant Events: 1/8 admit 1/9 TTE - EF 35% - septal and apical akinesis - inferior wall hypokinesis - mod mitral regurg - RA severely dilated  1/11 L/R heart cath - severe 2 vessel [name] w/ chronic total occlusion LAD and RCA  Subjective: No new complaints today.  Tolerating diuresis well so far.  Denies cp, sob, n/v, or abdom pain.  Has been up ambulating.    Assessment & Plan:  Acute Systolic CHF / cardiomyopathy EF 35% via TTE - titrating diuretic tx and educating on following weight at home - he will prob be a good candidate for prn diuretic +/- scheduled diuretic based upon a target weight as he is highly motivated to take care of himself  - net negative ~1.8L thus far - will need to establish a dry weight at time of d/c - need to establish maintenance diuretic dose as well (?40mg  lasix QD) Filed Weights   11/23/17 0352 11/24/17 0443 11/25/17 0305  Weight: 83.2 kg (183 lb 6.4 oz) 83.9 kg (185 lb) 81 kg (178 lb 8 oz)    Severe Hyponatremia - ?SIADH Pt states he is unaware of a hx of SIADH - reports he has not been told to restrict his fluid intake - unclear if he truly has SIADH or not - can not evaluate effectively at this time as he is on diuretics - cont to tx CHF/to diurese and follow Na - Na 134 in 2014 - Na 118 at presentation - Na is improving w/ diuresis - follow trend w/ ongoing diuresis   Severe multivessel [name] - occluded RCA and LAD medical management -  Cardiology following   Chronic Atrial fibrillation w/ acute RVR CHA2DS2-VASc Scoreis >2 - rate controlled - warfarin per Pharmacy   Mitral valve prolapse - moderate MR Per Cardiology   Essential HTN  BP well controlled / bordering on hypotension w/ ongoing diuresis - may require further titration of lasix dose but presently tolerating well   Hyperglycemia Resolved  Subclinical Hyperthyroidism? TSH is normal - free T4 is modestly elevated - T3 pending - cortisol is normal   DVT prophylaxis: warfarin Code Status: FULL CODE Family Communication: spoke w/ wife at bedside   Disposition Plan: PT/OT - for eventual d/c home when Na stabilizes, base weight established, and daily dose of diuretic established   Consultants:  Cardiology   Antimicrobials:  none   Objective: Blood pressure 116/78, pulse 87, temperature (!) 97.3 F (36.3 C), temperature source Oral, resp. rate 12, height 5\' 10"  (1.778 m), weight 81 kg (178 lb 8 oz), SpO2 95 %.  Intake/Output Summary (Last 24 hours) at 11/25/2017 1417 Last data filed at 11/25/2017 0900 Gross per 24 hour  Intake 360 ml  Output 1250 ml  Net -890 ml   Filed Weights   11/23/17 0352 11/24/17 0443 11/25/17 0305  Weight: 83.2 kg (183 lb 6.4 oz) 83.9 kg (185 lb) 81 kg (178 lb 8 oz)  Examination: General: No acute respiratory distress - alert and conversant  Lungs: CTA B - no wheezing  Cardiovascular: irreg irreg w/o gallup or rub  Abdomen: NT/ND, soft, bs+, no mass  Extremities: no signif LE edema   CBC: Recent Labs  Lab 11/18/17 2350 11/19/17 0556 11/22/17 0150 11/23/17 0420 11/24/17 0429 11/25/17 0335  WBC 12.1* 15.5* 13.4* 13.2* 11.1* 11.2*  NEUTROABS 10.3*  --   --   --   --   --   HGB 12.3* 11.7* 13.4 13.3 12.9* 13.4  HCT 34.7* 34.3* 38.3* 38.0* 37.5* 38.4*  MCV 87.8 87.7 88.7 88.6 89.3 89.1  PLT 324 336 264 260 243 676   Basic Metabolic Panel: Recent Labs  Lab 11/20/17 0221  11/21/17 0125  11/22/17 0150  11/22/17 0800 11/23/17 0420 11/24/17 0429 11/25/17 0335  NA 119*   < > 120*   < > 122*  123* 123* 123* 124* 127*  K 4.1  --  3.8  --  3.2*  --  3.9 3.9 4.0  CL 83*  --  79*  --  85*  --  87* 88* 87*  CO2 26  --  29  --  27  --  25 26 29   GLUCOSE 133*  --  127*  --  110*  --  101* 95 107*  BUN 24*  --  24*  --  29*  --  22* 21* 18  CREATININE 0.89  --  0.95  --  0.89  --  0.85 0.84 0.99  CALCIUM 7.9*  --  7.8*  --  7.8*  --  8.0* 8.0* 8.2*  MG 1.8  --  2.0  --  1.9  --  1.8 2.0  --    < > = values in this interval not displayed.   GFR: Estimated Creatinine Clearance: 55.3 mL/min (by C-G formula based on SCr of 0.99 mg/dL).  Liver Function Tests: Recent Labs  Lab 11/25/17 0335  AST 27  ALT 27  ALKPHOS 67  BILITOT 1.3*  PROT 5.8*  ALBUMIN 2.6*    Coagulation Profile: Recent Labs  Lab 11/21/17 1216 11/22/17 0150 11/23/17 0420 11/24/17 0429 11/25/17 0335  INR 1.68 1.77 2.55 3.23 3.42    Cardiac Enzymes: Recent Labs  Lab 11/18/17 1537 11/18/17 2350 11/19/17 0232  TROPONINI 0.09* 0.08* 0.09*    CBG: Recent Labs  Lab 11/22/17 0629  GLUCAP 127*    Recent Results (from the past 240 hour(s))  Respiratory Panel by PCR     Status: None   Collection Time: 11/18/17  8:55 AM  Result Value Ref Range Status   Adenovirus NOT DETECTED NOT DETECTED Final   Coronavirus 229E NOT DETECTED NOT DETECTED Final   Coronavirus HKU1 NOT DETECTED NOT DETECTED Final   Coronavirus NL63 NOT DETECTED NOT DETECTED Final   Coronavirus OC43 NOT DETECTED NOT DETECTED Final   Metapneumovirus NOT DETECTED NOT DETECTED Final   Rhinovirus / Enterovirus NOT DETECTED NOT DETECTED Final   Influenza A NOT DETECTED NOT DETECTED Final   Influenza B NOT DETECTED NOT DETECTED Final   Parainfluenza Virus 1 NOT DETECTED NOT DETECTED Final   Parainfluenza Virus 2 NOT DETECTED NOT DETECTED Final   Parainfluenza Virus 3 NOT DETECTED NOT DETECTED Final   Parainfluenza Virus 4 NOT DETECTED NOT  DETECTED Final   Respiratory Syncytial Virus NOT DETECTED NOT DETECTED Final   Bordetella pertussis NOT DETECTED NOT DETECTED Final   Chlamydophila pneumoniae NOT DETECTED NOT DETECTED Final   Mycoplasma  pneumoniae NOT DETECTED NOT DETECTED Final     Scheduled Meds: . aspirin EC  81 mg Oral Daily  . atorvastatin  20 mg Oral q1800  . digoxin  0.125 mg Oral Daily  . docusate sodium  200 mg Oral QHS  . finasteride  5 mg Oral Daily  . furosemide  40 mg Oral Daily  . metoprolol succinate  75 mg Oral Daily  . Warfarin - Pharmacist Dosing Inpatient   Does not apply q1800     LOS: 7 days   Cherene Altes, MD [facility] Office  [phone] Pager - Text Page per Amion as per below:  On-Call/Text Page:      [email or web address]      password [email or web address]  If 7PM-7AM, please contact night-coverage [email or web address] Password [email or web address] 11/25/2017, 2:17 PM

## 2017-11-25 NOTE — Progress Notes (Signed)
Progress Note  Patient Name: Ryan Savage Date of Encounter: 11/25/2017  Primary Cardiologist: [name]  Subjective   For the most part the only thing he notes is that he continues to have nasal drainage. He also notes having just a bad sense of taste and a stinging sensation on his tongue. He denies any chest pain or significant dyspnea. He is walked up and down the hallway without difficulty..   Inpatient Medications    Scheduled Meds: . aspirin EC  81 mg Oral Daily  . atorvastatin  20 mg Oral q1800  . digoxin  0.125 mg Oral Daily  . finasteride  5 mg Oral Daily  . furosemide  40 mg Oral Daily  . metoprolol succinate  75 mg Oral Daily  . senna-docusate  1 tablet Oral BID  . Warfarin - Pharmacist Dosing Inpatient   Does not apply q1800   Continuous Infusions:  PRN Meds: acetaminophen, alum & mag hydroxide-simeth, bisacodyl, guaiFENesin-dextromethorphan, lactulose, lip balm, loratadine, magnesium citrate, MUSCLE RUB, ondansetron (ZOFRAN) IV, phenol, polyvinyl alcohol, sodium chloride   Vital Signs    Vitals:   11/25/17 0300 11/25/17 0305 11/25/17 0400 11/25/17 0500  BP:  116/78    Pulse: (!) 162 68 96 87  Resp: (!) 27 (!) 31 (!) 32 12  Temp:  (!) 97.3 F (36.3 C)    TempSrc:  Oral    SpO2: (!) 86% 92% 90% 95%  Weight:  178 lb 8 oz (81 kg)    Height:        Intake/Output Summary (Last 24 hours) at 11/25/2017 1813 Last data filed at 11/25/2017 0900 Gross per 24 hour  Intake 240 ml  Output 1050 ml  Net -810 ml   Filed Weights   11/23/17 0352 11/24/17 0443 11/25/17 0305  Weight: 183 lb 6.4 oz (83.2 kg) 185 lb (83.9 kg) 178 lb 8 oz (81 kg)    Physical Exam   General: elderly gentleman. No acute distress.Well-nourished, well-groomed. Head: Echo/AT/EOMI.MMM. Neck: no obvious carotid bruit or JVD. Lungs: CTA B, nonlabored.no W/R/R Cardiovascular: Irregularly irregular with normal S1 and S2. No M/R/G. Abdomen: soft/NT/ND/NABS. No HSM.Marland Kitchen MSK: normal  strength. 5/5 strength Extremities: no C/C/E. Neuro: A&O x 3.  Pleasant mood and affect.  Labs    Chemistry Recent Labs  Lab 11/23/17 0420 11/24/17 0429 11/25/17 0335  NA 123* 124* 127*  K 3.9 3.9 4.0  CL 87* 88* 87*  CO2 25 26 29   GLUCOSE 101* 95 107*  BUN 22* 21* 18  CREATININE 0.85 0.84 0.99  CALCIUM 8.0* 8.0* 8.2*  PROT  --   --  5.8*  ALBUMIN  --   --  2.6*  AST  --   --  27  ALT  --   --  27  ALKPHOS  --   --  67  BILITOT  --   --  1.3*  GFRNONAA >60 >60 >60  GFRAA >60 >60 >60  ANIONGAP 11 10 11      Hematology Recent Labs  Lab 11/23/17 0420 11/24/17 0429 11/25/17 0335  WBC 13.2* 11.1* 11.2*  RBC 4.29 4.20* 4.31  HGB 13.3 12.9* 13.4  HCT 38.0* 37.5* 38.4*  MCV 88.6 89.3 89.1  MCH 31.0 30.7 31.1  MCHC 35.0 34.4 34.9  RDW 13.1 13.3 13.3  PLT 260 243 217    Cardiac Enzymes Recent Labs  Lab 11/18/17 2350 11/19/17 0232  TROPONINI 0.08* 0.09*    No results for input(s): TROPIPOC in the last  168 hours.   BNP No results for input(s): BNP, PROBNP in the last 168 hours.   DDimer No results for input(s): DDIMER in the last 168 hours.   Radiology    No results found.  Telemetry    11/24/17 Afib HR 91 - Personally Reviewed//  ECG     11/19/17 Afib HR 97- Personally Reviewed  Cardiac Studies   Cath (11/21/17):  1. 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 Patent left main and circumflex/intermediate branches 3. Low right and left sided intracardiac filling pressures 4. Known severe LV systolic dysfunction  Recommend:  Medical therapy for [name]. Reviewed films with colleagues and we agree CTO-intervention would have a low likelihood of success in either the LAD or RCA secondary to anatomic features of heavy calcification and lesion length in the RCA and heavy calcification and nebulous stump in the LAD  Resume warfarin tonight  With low filling pressures, hold diuretics x 24 hours  and resume oral diuretics thereafter  Mitral regurgitation by most recent echo appears only mild-moderate. Cath shows no significant V-waves. Doubt mitral regurgitation is playing a major role at present.  TTE: 11/19/16  Study Conclusions  - Left ventricle: Septal and apical akinesis Inferior wall hypokinesis. The estimated ejection fraction was 35%. The study is not technically sufficient to allow evaluation of LV diastolic function. - Aortic valve: There was mild regurgitation. - Mitral valve: There was mild to moderate regurgitation. - Left atrium: The atrium was moderately dilated. - Right atrium: The atrium was severely dilated. - Atrial septum: No defect or patent foramen ovale was identified.  Patient Profile     84 y.o. 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 Found to have EF of 35%.  Assessment & Plan    1. Acute combined systolic and diastolic heart failure/ ischemic cardiomyopathy: -Weight Now down to 178 -I&O, net negative remains roughly 1.7/1.8 L -BP remains slightly improved. Seems to be tolerating increased dose of beta blocker -BP remains too low for ACEI/ARB. -if not able to do ARB, may consider spironolactone to discharge -He is on digoxin need to closely follow blood clots. -at this point, I think the best option will be to simply have him on a low-dose standing Lasix with when necessary dosing on weight gain. I would like to see what his sodium level does, if it does drop again, then would simply just do a when necessary dose of Lasix.  2. Chronic atrial fibrillation with RVR:  -continue warfarincoumadin for [place] -Rate elevated on admission, may need additional rate control be on current dose of digoxin and -- given Vit K due to supratherapeutic INR -Dilt stopped given low EF per echo> placed on Toprol XL 25mg >>  tolerated increased dose to75 mg - rate better controlled now. -Digoxin 0.125mg  daily   This patients CHA2DS2-VASc Score and unadjusted Ischemic Stroke Rate (% per year) is equal to 7.2 % stroke rate/year from a score of 5  Above score calculated as 1 point each if present [CHF, HTN, DM, Vascular=MI/PAD/Aortic Plaque, Age if 65-74, or Male] Above score calculated as 2 points each if present [Age > 75, or Stroke/TIA/TE]   3. MV prolapse with moderate MR: not assignificant on current echo.we'll need to follow closely given his [name]. Concern for worsening ischemic MR.  4. HTN:not currently an issue. Barely tolerating Toprol  5. HL:on moderate dose statin   6. Hyponatremia/ SIADH:ot sure if this is a true diagnosis or not. Thankfully sodium levels have improved. - Restarted Lasix   7. SAY:TKZSWF 2 vessel disease with occluded LAD and RCA extensive collaterals. Plan for medical management.  No recurrent anginal symptoms.  Continue aspirin, statin and beta blocker. In the absence of angina would not require nitrate or Ranexa, however these would be good options in the future.   Signed, Glenetta Hew, MD   11/25/2017, 6:13 PM   Pager: [phone]  For questions or updates, please contact   Please consult [email or web address] for contact info under Cardiology/STEMI.

## 2017-11-26 LAB — BASIC METABOLIC PANEL
Anion gap: 12 (ref 5–15)
BUN: 17 mg/dL (ref 6–20)
CO2: 26 mmol/L (ref 22–32)
Calcium: 8.4 mg/dL — ABNORMAL LOW (ref 8.9–10.3)
Chloride: 88 mmol/L — ABNORMAL LOW (ref 101–111)
Creatinine, Ser: 1.03 mg/dL (ref 0.61–1.24)
GFR calc Af Amer: >60 mL/min (ref >60)
GFR calc non Af Amer: >60 mL/min (ref >60)
Glucose, Bld: 101 mg/dL — ABNORMAL HIGH (ref 65–99)
Potassium: 3.9 mmol/L (ref 3.5–5.1)
Sodium: 126 mmol/L — ABNORMAL LOW (ref 135–145)

## 2017-11-26 LAB — CBC
HCT: 38.1 % — ABNORMAL LOW (ref 39.0–52.0)
Hemoglobin: 13.2 g/dL (ref 13.0–17.0)
MCH: 31.2 pg (ref 26.0–34.0)
MCHC: 34.6 g/dL (ref 30.0–36.0)
MCV: 90.1 fL (ref 78.0–100.0)
Platelets: 198 10*3/uL (ref 150–400)
RBC: 4.23 MIL/uL (ref 4.22–5.81)
RDW: 13.6 % (ref 11.5–15.5)
WBC: 10 10*3/uL (ref 4.0–10.5)

## 2017-11-26 LAB — PROTIME-INR
INR: 2.88
Prothrombin Time: 29.9 seconds — ABNORMAL HIGH (ref 11.4–15.2)

## 2017-11-26 LAB — T3: T3, Total: 72 ng/dL (ref 71–180)

## 2017-11-26 MED ORDER — WARFARIN SODIUM 2.5 MG PO TABS
3.5000 mg | ORAL_TABLET | Freq: Once | ORAL | Status: AC
  Administered 2017-11-26: 3.5 mg via ORAL
  Filled 2017-11-26: qty 1

## 2017-11-26 MED ORDER — FUROSEMIDE 20 MG PO TABS
20.0000 mg | ORAL_TABLET | Freq: Every day | ORAL | Status: DC
  Administered 2017-11-27: 20 mg via ORAL
  Filled 2017-11-26: qty 1

## 2017-11-26 NOTE — Progress Notes (Signed)
ANTICOAGULATION CONSULT NOTE - Follow Up Consult  Pharmacy Consult for Warfarin Indication: atrial fibrillation  Allergies  Allergen Reactions  . Novocain [Procaine] Rash    Patient Measurements: Height: 5\' 10"  (177.8 cm) Weight: 177 lb (80.3 kg) IBW/kg (Calculated) : 73 Heparin Dosing Weight: 86.7 kg  Vital Signs: Temp: 97.8 F (36.6 C) (01/16 0828) Temp Source: Oral (01/16 0828) BP: 113/71 (01/16 0828) Pulse Rate: 94 (01/16 0828)  Labs: Recent Labs    11/24/17 0429 11/25/17 0335 11/26/17 0326  HGB 12.9* 13.4 13.2  HCT 37.5* 38.4* 38.1*  PLT 243 217 198  LABPROT 32.7* 34.3* 29.9*  INR 3.23 3.42 2.88  CREATININE 0.84 0.99 1.03    Assessment: 18 yom presented to ED on 1/8 with SOB.  Hx of Afib on warfarin PTA, INR >10 on admit. INR reversed with vitamin k (5mg  on 1/8 and 2.5mg  x2 on 1/10) pending cath. Heparin bridge started 1/11 when INR fell below 2. Underwent cath 1/11 and heparin resumed post-cath as bridge to warfarin.   Heparin d/c'ed 1/13 with therapeutic INR. INR then trended up and warfarin held for 2 doses. INR back within goal range today at 2.88. CBC stable. No bleeding.  PTA dose: 4mg  x 1 day, 3.5mg  x 2 days, then repeat cycle - last dose 4mg  on 1/7  Goal of Therapy:  INR 2-3 Monitor platelets by anticoagulation protocol: Yes   Plan:  1) Warfarin 3.5mg  tonight 2) Daily INR  Nena Jordan, PharmD, BCPS 11/26/2017 10:06 AM

## 2017-11-26 NOTE — Progress Notes (Addendum)
PROGRESS NOTE    Ryan Savage  VHQ:[id number] DOB: 1929-08-01 DOA: 11/18/2017 PCP: Lajean Manes, MD   Brief Narrative:  84 y.o. 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  He reports having gone 3 weeks with no sleep.  Poor PO intake, no appetite.  He developed congestion 2-3 days ago with significant worsening of his SOB.  SOB is primarily with exertion.  Cough is a deep cough, very little phlegm that is dark, brown, or white; 1 time with a trace of blood.  No chest pain.  No confusion.  Weakness has led to inability to do his usual activities, but prior to 3 weeks ago he was in [employer] and walking in the park.  Now, he can't even take the trash can to the street.  No fever.  No sick contacts.   ED Course:  Acute new CHF exacerbation and hyponatremia.  Chronic afib on Coumadin.  SOB that worsened last night, malaise and unable to exercise, cough x 3 weeks.  Afib with RVR 130-140 with hypoxia (4L [place] O2).  CXR with pulmonary edema.  BNP 1400.  Given Lasix, breathing treatments, Cardizem drip.    Subjective: 1/16 A/4, negative CP, negative SOB, negative abdominal pain, negative N/V    Assessment & Plan:   Principal Problem:   Acute exacerbation of CHF (congestive heart failure) (HCC) Active Problems:   Permanent atrial fibrillation (HCC)   Benign essential HTN   Mitral valve prolapse   Hyponatremia with excess extracellular fluid volume   Hyperglycemia   Supratherapeutic INR   Acute on chronic systolic heart failure (HCC)   Acute Systolic CHF/cardiomyopathy -negative prior history of CHF: CXR C/W pulmonary edemawith elevated BNP -strict I&O since admission -1.1 L -Daily weight Filed Weights   11/24/17 0443 11/25/17 0305 11/26/17 0428  Weight: 185 lb (83.9 kg) 178 lb 8 oz (81 kg) 177 lb (80.3 kg)  -1/11 S/P RIGHT LEFT artery catheterization: See results below     -Digoxin 0.125 mg daily -Lasix 20 mg daily -Toprol 75 mg daily -Discussed case with cardiology and they stated to patient stable overnight may be discharged    Chronic Atrial fibrillation(CHA2DS2-VASc Score is >2)/Supratherapeutic INR -Coumadin on hold secondary to being supratherapeutic and patient requires cardiac catheterization -Currently in NSR Recent Labs  Lab 11/21/17 1216 11/22/17 0150 11/23/17 0420 11/24/17 0429 11/25/17 0335 11/26/17 0326  INR 1.68 1.77 2.55 3.23 3.42 2.88  -therapeutic  Mitral valve prolapse -Per cardiology S/P cardiac mitral valve prolapse no current symptoms   Essential HTN  -See CHF  Severe Hyponatremia -Multifactorial hypervolemic secondary to CHF, SIADH (not observing fluid restriction)? -patient asymptomatic therefore current level of sodium must have been arrived at gradually: No history of altered mental status or seizure per patient and wife. -Patient sodium now in the mid 120s: Cardiology would like to stop Lasix in the future as an outpatient in order for [place] to determine if truly SIADH. -See SIADH and CHF  SIADH -SIADH exacerbating CHF findings of hyponatremia. Continue fluid restrict to 1078ml/dy -Sodium improving with fluid restriction: Diuretics held secondary to borderline BP  -Per discussion with cardiology in the future they will DC his Lasix to allow PCP to truly evaluate patient for SIADH as an outpatient   Hyperglycemia -Resolved  Hypokalemia -Potassium goal> 4  Subclinical Hyperthyroidism? -TSH low normal, free T4 mildly elevated -T3 low normal -PCP to decide on  further evaluation, if Endocrinology workup warranted.     DVT prophylaxis: heparin drip Code Status: full Family Communication: wife at bedside for discussion of plan care Disposition Plan: TBD   Consultants:  Cardiology     Procedures/Significant Events:  1/9 Echocardiogram:Left ventricle: -Septal and apical akinesis Inferior wall    hypokinesis. -LVEF=35%. -study not technically sufficient to allow evaluation of LV diastolic   function. - Mitral valve: mild to moderate regurgitation. - Left atrium: moderately dilated.-Right atrium:severely dilated. 1/11 RIGHT/LEFT Cardiac Catheterization:. 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 Reviewed films with colleagues and we agree CTO-intervention would have a low likelihood of success in either the LAD or RCA secondary to anatomic features of heavy calcification and lesion length in the RCA and heavy calcification and nebulous stump in the LAD    I have personally reviewed and interpreted all radiology studies and my findings are as above.  VENTILATOR SETTINGS:    Cultures   Antimicrobials:    Devices    LINES / TUBES:      Continuous Infusions:    Objective: Vitals:   11/25/17 1930 11/25/17 2352 11/26/17 0428 11/26/17 0828  BP: 103/68 110/85 114/82 113/71  Pulse: 89 (!) 108 (!) 111 94  Resp: 20 19 15 18   Temp: (!) 97.5 F (36.4 C) 97.6 F (36.4 C) (!) 97.5 F (36.4 C) 97.8 F (36.6 C)  TempSrc: Oral Oral Oral Oral  SpO2: 97% 90% 94% 94%  Weight:   177 lb (80.3 kg)   Height:        Intake/Output Summary (Last 24 hours) at 11/26/2017 0900 Last data filed at 11/26/2017 0827 Gross per 24 hour  Intake 360 ml  Output 250 ml  Net 110 ml   Filed Weights   11/24/17 0443 11/25/17 0305 11/26/17 0428  Weight: 185 lb (83.9 kg) 178 lb 8 oz (81 kg) 177 lb (80.3 kg)     Physical Exam:  General: A/O 4, No acute respiratory distress Neck:  Negative scars, masses, torticollis, lymphadenopathy, JVD Lungs: Clear to auscultation bilaterally without wheezes or crackles Cardiovascular: Irregular irregular rhythm and rate without murmur gallop or rub normal  S1 and S2 Abdomen: negative abdominal pain, nondistended, positive soft, bowel sounds, no rebound, no ascites, no appreciable mass Extremities: No significant cyanosis, clubbing, or edema bilateral lower extremities Skin: Negative rashes, lesions, ulcers Psychiatric:  Negative depression, negative anxiety, negative fatigue, negative mania  Central nervous system:  Cranial nerves II through XII intact, tongue/uvula midline, all extremities muscle strength 5/5, sensation intact throughout,  negative dysarthria, negative expressive aphasia, negative receptive aphasia.    Data Reviewed: Care during the described time interval was provided by me .  I have reviewed this patient's available data, including medical history, events of note, physical examination, and all test results as part of my evaluation.   CBC: Recent Labs  Lab 11/22/17 0150 11/23/17 0420 11/24/17 0429 11/25/17 0335 11/26/17 0326  WBC 13.4* 13.2* 11.1* 11.2* 10.0  HGB 13.4 13.3 12.9* 13.4 13.2  HCT 38.3* 38.0* 37.5* 38.4* 38.1*  MCV 88.7 88.6 89.3 89.1 90.1  PLT 264 260 243 217 703   Basic Metabolic Panel: Recent Labs  Lab 11/20/17 0221  11/21/17 0125  11/22/17 0150 11/22/17 0800 11/23/17 0420 11/24/17 0429 11/25/17 0335 11/26/17 0326  NA 119*   < > 120*   < >  122*  123* 123* 123* 124* 127* 126*  K 4.1  --  3.8  --  3.2*  --  3.9 3.9 4.0 3.9  CL 83*  --  79*  --  85*  --  87* 88* 87* 88*  CO2 26  --  29  --  27  --  25 26 29 26   GLUCOSE 133*  --  127*  --  110*  --  101* 95 107* 101*  BUN 24*  --  24*  --  29*  --  22* 21* 18 17  CREATININE 0.89  --  0.95  --  0.89  --  0.85 0.84 0.99 1.03  CALCIUM 7.9*  --  7.8*  --  7.8*  --  8.0* 8.0* 8.2* 8.4*  MG 1.8  --  2.0  --  1.9  --  1.8 2.0  --   --    < > = values in this interval not displayed.   GFR: Estimated Creatinine Clearance: 53.2 mL/min (by C-G formula based on SCr of 1.03 mg/dL). Liver Function Tests: Recent Labs  Lab 11/25/17 0335  AST 27  ALT  27  ALKPHOS 67  BILITOT 1.3*  PROT 5.8*  ALBUMIN 2.6*   No results for input(s): LIPASE, AMYLASE in the last 168 hours. No results for input(s): AMMONIA in the last 168 hours. Coagulation Profile: Recent Labs  Lab 11/22/17 0150 11/23/17 0420 11/24/17 0429 11/25/17 0335 11/26/17 0326  INR 1.77 2.55 3.23 3.42 2.88   Cardiac Enzymes: No results for input(s): CKTOTAL, CKMB, CKMBINDEX, TROPONINI in the last 168 hours. BNP (last 3 results) No results for input(s): PROBNP in the last 8760 hours. HbA1C: No results for input(s): HGBA1C in the last 72 hours. CBG: Recent Labs  Lab 11/22/17 0629  GLUCAP 127*   Lipid Profile: No results for input(s): CHOL, HDL, LDLCALC, TRIG, CHOLHDL, LDLDIRECT in the last 72 hours. Thyroid Function Tests: No results for input(s): TSH, T4TOTAL, FREET4, T3FREE, THYROIDAB in the last 72 hours. Anemia Panel: No results for input(s): VITAMINB12, FOLATE, FERRITIN, TIBC, IRON, RETICCTPCT in the last 72 hours. Urine analysis:    Component Value Date/Time   COLORURINE YELLOW 11/18/2017 0830   APPEARANCEUR CLEAR 11/18/2017 0830   LABSPEC 1.008 11/18/2017 0830   PHURINE 6.0 11/18/2017 0830   GLUCOSEU NEGATIVE 11/18/2017 0830   HGBUR MODERATE (A) 11/18/2017 0830   BILIRUBINUR NEGATIVE 11/18/2017 0830   KETONESUR 5 (A) 11/18/2017 0830   PROTEINUR NEGATIVE 11/18/2017 0830   NITRITE NEGATIVE 11/18/2017 0830   LEUKOCYTESUR NEGATIVE 11/18/2017 0830   Sepsis Labs: @LABRCNTIP (procalcitonin:4,lacticidven:4)  ) Recent Results (from the past 240 hour(s))  Respiratory Panel by PCR     Status: None   Collection Time: 11/18/17  8:55 AM  Result Value Ref Range Status   Adenovirus NOT DETECTED NOT DETECTED Final   Coronavirus 229E NOT DETECTED NOT DETECTED Final   Coronavirus HKU1 NOT DETECTED NOT DETECTED Final   Coronavirus NL63 NOT DETECTED NOT DETECTED Final   Coronavirus OC43 NOT DETECTED NOT DETECTED Final   Metapneumovirus NOT DETECTED NOT DETECTED  Final   Rhinovirus / Enterovirus NOT DETECTED NOT DETECTED Final   Influenza A NOT DETECTED NOT DETECTED Final   Influenza B NOT DETECTED NOT DETECTED Final   Parainfluenza Virus 1 NOT DETECTED NOT DETECTED Final   Parainfluenza Virus 2 NOT DETECTED NOT DETECTED Final   Parainfluenza Virus 3 NOT DETECTED NOT DETECTED Final   Parainfluenza Virus 4 NOT DETECTED NOT DETECTED Final  Respiratory Syncytial Virus NOT DETECTED NOT DETECTED Final   Bordetella pertussis NOT DETECTED NOT DETECTED Final   Chlamydophila pneumoniae NOT DETECTED NOT DETECTED Final   Mycoplasma pneumoniae NOT DETECTED NOT DETECTED Final         Radiology Studies: No results found.      Scheduled Meds: . aspirin EC  81 mg Oral Daily  . atorvastatin  20 mg Oral q1800  . digoxin  0.125 mg Oral Daily  . finasteride  5 mg Oral Daily  . furosemide  40 mg Oral Daily  . metoprolol succinate  75 mg Oral Daily  . senna-docusate  1 tablet Oral BID  . Warfarin - Pharmacist Dosing Inpatient   Does not apply q1800   Continuous Infusions:    LOS: 8 days    Time spent: 40 minutes    Cherolyn Behrle, Geraldo Docker, MD [facility] Pager [phone]   If 7PM-7AM, please contact night-coverage [email or web address] Password [email or web address] 11/26/2017, 9:00 AM

## 2017-11-26 NOTE — Progress Notes (Signed)
Physical Therapy Treatment Patient Details Name: Ryan Savage MRN: [id number] DOB: 05-Jul-1929 Today's Date: 11/26/2017    History of Present Illness Pt is an 84 yo male admitted 11/18/17 with SOB and chest congestion; worked up for acute CHF exacerbation and hyponatremia. L/R heart cath 1/11 showed severe 2 vessel [name] w/ chronic total occlusion LAD and RCA. PMH includes a-fib, HTN, CHF, thrombocytopenia, OSA, [name].     PT Comments    Pt continuing to make good progress with functional mobility. Pt reported that he ambulates often with staff and his spouse. Pt is steady with and without use of RW. All VSS throughout. Pt would continue to benefit from skilled physical therapy services at this time while admitted and after d/c to address the below listed limitations in order to improve overall safety and independence with functional mobility.    Follow Up Recommendations  Home health PT;Supervision - Intermittent     Equipment Recommendations  None recommended by PT    Recommendations for Other Services       Precautions / Restrictions Precautions Precautions: None Restrictions Weight Bearing Restrictions: No    Mobility  Bed Mobility               General bed mobility comments: pt OOB in recliner chair upon arrival  Transfers Overall transfer level: Needs assistance Equipment used: None Transfers: Sit to/from Stand Sit to Stand: Supervision         General transfer comment: supervision for safety  Ambulation/Gait Ambulation/Gait assistance: Supervision;Min guard Ambulation Distance (Feet): 450 Feet(300' x1 with RW, 150' x1 with no AD) Assistive device: Rolling walker (2 wheeled);None Gait Pattern/deviations: Step-through pattern;Decreased stride length Gait velocity: Decreased   General Gait Details: pt very steady with RW (used for pt comfort) and without use of an AD; no LOB or need for physical assistance   Stairs            Wheelchair Mobility     Modified Rankin (Stroke Patients Only)       Balance Overall balance assessment: Needs assistance Sitting-balance support: Feet supported Sitting balance-Leahy Scale: Good     Standing balance support: During functional activity;No upper extremity supported Standing balance-Leahy Scale: Good                              Cognition Arousal/Alertness: Awake/alert Behavior During Therapy: WFL for tasks assessed/performed Overall Cognitive Status: Within Functional Limits for tasks assessed                                        Exercises      General Comments        Pertinent Vitals/Pain Pain Assessment: No/denies pain    Home Living                      Prior Function            PT Goals (current goals can now be found in the care plan section) Acute Rehab PT Goals PT Goal Formulation: With patient Time For Goal Achievement: 12/09/17 Potential to Achieve Goals: Good Progress towards PT goals: Progressing toward goals    Frequency    Min 1X/week      PT Plan Frequency needs to be updated;Other (comment)(decrease freq, high level function, ambs with NT/family)    Co-evaluation  AM-PAC PT "6 Clicks" Daily Activity  Outcome Measure  Difficulty turning over in bed (including adjusting bedclothes, sheets and blankets)?: None Difficulty moving from lying on back to sitting on the side of the bed? : None Difficulty sitting down on and standing up from a chair with arms (e.g., wheelchair, bedside commode, etc,.)?: None Help needed moving to and from a bed to chair (including a wheelchair)?: None Help needed walking in hospital room?: A Little Help needed climbing 3-5 steps with a railing? : A Little 6 Click Score: 22    End of Session Equipment Utilized During Treatment: Gait belt Activity Tolerance: Patient tolerated treatment well Patient left: in chair;with call bell/phone within reach Nurse  Communication: Mobility status PT Visit Diagnosis: Other abnormalities of gait and mobility (R26.89)     Time: 6270-3500 PT Time Calculation (min) (ACUTE ONLY): 19 min  Charges:  $Gait Training: 8-22 mins                    G Codes:       [place], [place], [place] 11/26/2017, 11:24 AM

## 2017-11-26 NOTE — Progress Notes (Signed)
Progress Note  Patient Name: Ryan Savage Date of Encounter: 11/26/2017  Primary Cardiologist: Radford Pax  Subjective   Feeling well with only compliant being nasal drainage.   Inpatient Medications    Scheduled Meds: . aspirin EC  81 mg Oral Daily  . atorvastatin  20 mg Oral q1800  . digoxin  0.125 mg Oral Daily  . finasteride  5 mg Oral Daily  . furosemide  40 mg Oral Daily  . metoprolol succinate  75 mg Oral Daily  . senna-docusate  1 tablet Oral BID  . warfarin  3.5 mg Oral ONCE-1800  . Warfarin - Pharmacist Dosing Inpatient   Does not apply q1800   Continuous Infusions:  PRN Meds: acetaminophen, alum & mag hydroxide-simeth, bisacodyl, guaiFENesin-dextromethorphan, lactulose, lip balm, loratadine, magnesium citrate, MUSCLE RUB, ondansetron (ZOFRAN) IV, phenol, polyvinyl alcohol, sodium chloride   Vital Signs    Vitals:   11/25/17 1930 11/25/17 2352 11/26/17 0428 11/26/17 0828  BP: 103/68 110/85 114/82 113/71  Pulse: 89 (!) 108 (!) 111 94  Resp: 20 19 15 18   Temp: (!) 97.5 F (36.4 C) 97.6 F (36.4 C) (!) 97.5 F (36.4 C) 97.8 F (36.6 C)  TempSrc: Oral Oral Oral Oral  SpO2: 97% 90% 94% 94%  Weight:   177 lb (80.3 kg)   Height:        Intake/Output Summary (Last 24 hours) at 11/26/2017 1158 Last data filed at 11/26/2017 0827 Gross per 24 hour  Intake 360 ml  Output 250 ml  Net 110 ml   Filed Weights   11/24/17 0443 11/25/17 0305 11/26/17 0428  Weight: 185 lb (83.9 kg) 178 lb 8 oz (81 kg) 177 lb (80.3 kg)    Telemetry    Afib Rate 80-90s - Personally Reviewed  Physical Exam   General: Well developed, well nourished, male appearing in no acute distress. Head: Normocephalic, atraumatic.  Neck: Supple without bruits, JVD. Lungs:  Resp regular and unlabored, CTA. Heart: Irreg Irreg, S1, S2, no S3, S4, soft systolic murmur; no rub. Abdomen: Soft, non-tender, non-distended with normoactive bowel sounds.  Extremities: No clubbing, cyanosis, edema.  Distal pedal pulses are 2+ bilaterally. Neuro: Alert and oriented X 3. Moves all extremities spontaneously. Psych: Normal affect.  Labs    Chemistry Recent Labs  Lab 11/24/17 0429 11/25/17 0335 11/26/17 0326  NA 124* 127* 126*  K 3.9 4.0 3.9  CL 88* 87* 88*  CO2 26 29 26   GLUCOSE 95 107* 101*  BUN 21* 18 17  CREATININE 0.84 0.99 1.03  CALCIUM 8.0* 8.2* 8.4*  PROT  --  5.8*  --   ALBUMIN  --  2.6*  --   AST  --  27  --   ALT  --  27  --   ALKPHOS  --  67  --   BILITOT  --  1.3*  --   GFRNONAA >60 >60 >60  GFRAA >60 >60 >60  ANIONGAP 10 11 12      Hematology Recent Labs  Lab 11/24/17 0429 11/25/17 0335 11/26/17 0326  WBC 11.1* 11.2* 10.0  RBC 4.20* 4.31 4.23  HGB 12.9* 13.4 13.2  HCT 37.5* 38.4* 38.1*  MCV 89.3 89.1 90.1  MCH 30.7 31.1 31.2  MCHC 34.4 34.9 34.6  RDW 13.3 13.3 13.6  PLT 243 217 198    Cardiac EnzymesNo results for input(s): TROPONINI in the last 168 hours. No results for input(s): TROPIPOC in the last 168 hours.   BNPNo results for input(s): BNP, PROBNP  in the last 168 hours.   DDimer No results for input(s): DDIMER in the last 168 hours.    Radiology    No results found.  Cardiac Studies   Cath (11/21/17):  1. 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 Patent left main and circumflex/intermediate branches 3. Low right and left sided intracardiac filling pressures 4. Known severe LV systolic dysfunction  Recommend:  Medical therapy for [name]. Reviewed films with colleagues and we agree CTO-intervention would have a low likelihood of success in either the LAD or RCA secondary to anatomic features of heavy calcification and lesion length in the RCA and heavy calcification and nebulous stump in the LAD  Resume warfarin tonight  With low filling pressures, hold diuretics x 24 hours and resume oral diuretics thereafter  Mitral regurgitation by most recent echo appears only  mild-moderate. Cath shows no significant V-waves. Doubt mitral regurgitation is playing a major role at present.  TTE: 11/19/16  Study Conclusions  - Left ventricle: Septal and apical akinesis Inferior wall hypokinesis. The estimated ejection fraction was 35%. The study is not technically sufficient to allow evaluation of LV diastolic function. - Aortic valve: There was mild regurgitation. - Mitral valve: There was mild to moderate regurgitation. - Left atrium: The atrium was moderately dilated. - Right atrium: The atrium was severely dilated. - Atrial septum: No defect or patent foramen ovale was identified.  Patient Profile     84 y.o. male PMH ofposterior MV leaflet prolapse withmoderate MR, HTN, chronic Afib, BPH, HTN, HLD, SIADH, thrombocytopeniawho presented with ongoing cough and shortness of breath. Found to have EF of 35%.  Assessment & Plan    1. Acute combined systolic and diastolic heart failure/ ischemic cardiomyopathy: Weight Now down to 177, net negative remains roughly 1.6 L. BP remains soft, therefore would hold on adding ACEI/ARB. Consider adding this as outpatient. Volume stable on exam. Would continue with current dose of lasix (decreased dose to 20 mg) with PRN for weight gain.   2. Chronic atrial fibrillation with RVR: Rates have improved. INR was high on admission, but given Vit K with improvement. Now back on coumadin with Phx dosing.  -continue warfarin for Victor. Tolerating higher dose Toprol XL, and Dig This patients CHA2DS2-VASc Score and unadjusted Ischemic Stroke Rate (% per year) is equal to 7.2 % stroke rate/year from a score of 5 Above score calculated as 1 point each if present [CHF, HTN, DM, Vascular=MI/PAD/Aortic Plaque, Age if 16-74, or Male];Above score calculated as 2 points each if present [Age > 75, or Stroke/TIA/TE]  3. MV prolapse with moderate MR: not assignificant on current echo.we'll need to follow closely given his [name].  Concern for worsening ischemic MR.  4. HTN: not currently an issue. Currently tolerating Toprol  5. HL: on statin  6. Hyponatremia/ SIADH: Na+ stable at 126 today.   7. [name]: severe 2 vessel disease with occluded LAD and RCA extensive collaterals. Plan for medical management. - on aspirin, statin and beta blocker.   Signed, Reino Bellis, NP  11/26/2017, 11:58 AM  Pager # [phone]   For questions or updates, please contact [place] Please consult [email or web address] for contact info under Cardiology/STEMI.    I have seen, examined and evaluated the patient this PM on rounds along with Reino Bellis, NP-C.  After reviewing all the available data and chart, we discussed the patients laboratory, study & physical findings as well as symptoms in detail. I  agree with her findings, examination as well as impression recommendations as per our discussion.    Attending adjustments noted in italics.   Probably nearing being ready for d/c from a cardiology standpoint.   Will need to have sodium levels followed as OP. (consider holding lasix for a few days in OP setting to check SIADH studies).  Will sign off for now.  Will be available for ?s.     Glenetta Hew, M.D., M.S. Interventional Cardiologist   Pager # [phone] Phone # [phone] [address] [place], [place] [address]

## 2017-11-27 DIAGNOSIS — I5021 Acute systolic (congestive) heart failure: Secondary | ICD-10-CM

## 2017-11-27 DIAGNOSIS — E222 Syndrome of inappropriate secretion of antidiuretic hormone: Secondary | ICD-10-CM

## 2017-11-27 DIAGNOSIS — I2510 Atherosclerotic heart disease of native coronary artery without angina pectoris: Secondary | ICD-10-CM

## 2017-11-27 DIAGNOSIS — I482 Chronic atrial fibrillation, unspecified: Secondary | ICD-10-CM

## 2017-11-27 DIAGNOSIS — E0590 Thyrotoxicosis, unspecified without thyrotoxic crisis or storm: Secondary | ICD-10-CM

## 2017-11-27 LAB — MAGNESIUM: Magnesium: 2 mg/dL (ref 1.7–2.4)

## 2017-11-27 LAB — CBC
HCT: 38 % — ABNORMAL LOW (ref 39.0–52.0)
Hemoglobin: 13 g/dL (ref 13.0–17.0)
MCH: 31 pg (ref 26.0–34.0)
MCHC: 34.2 g/dL (ref 30.0–36.0)
MCV: 90.5 fL (ref 78.0–100.0)
Platelets: 193 10*3/uL (ref 150–400)
RBC: 4.2 MIL/uL — ABNORMAL LOW (ref 4.22–5.81)
RDW: 13.5 % (ref 11.5–15.5)
WBC: 9.2 10*3/uL (ref 4.0–10.5)

## 2017-11-27 LAB — BASIC METABOLIC PANEL
Anion gap: 9 (ref 5–15)
BUN: 19 mg/dL (ref 6–20)
CO2: 30 mmol/L (ref 22–32)
Calcium: 8.5 mg/dL — ABNORMAL LOW (ref 8.9–10.3)
Chloride: 90 mmol/L — ABNORMAL LOW (ref 101–111)
Creatinine, Ser: 1.16 mg/dL (ref 0.61–1.24)
GFR calc Af Amer: >60 mL/min (ref >60)
GFR calc non Af Amer: 55 mL/min — ABNORMAL LOW (ref >60)
Glucose, Bld: 100 mg/dL — ABNORMAL HIGH (ref 65–99)
Potassium: 4 mmol/L (ref 3.5–5.1)
Sodium: 129 mmol/L — ABNORMAL LOW (ref 135–145)

## 2017-11-27 LAB — PROTIME-INR
INR: 2.94
Prothrombin Time: 30.4 seconds — ABNORMAL HIGH (ref 11.4–15.2)

## 2017-11-27 MED ORDER — DIGOXIN 125 MCG PO TABS: 0.1250 mg | ORAL_TABLET | Freq: Every day | ORAL | 0 refills | Status: DC

## 2017-11-27 MED ORDER — WARFARIN SODIUM 3 MG PO TABS: 1.5000 mg | ORAL_TABLET | Freq: Every day | ORAL | 0 refills | Status: DC

## 2017-11-27 MED ORDER — METOPROLOL SUCCINATE ER 25 MG PO TB24: 75.0000 mg | ORAL_TABLET | Freq: Every day | ORAL | 0 refills | Status: DC

## 2017-11-27 MED ORDER — FUROSEMIDE 20 MG PO TABS: 20.0000 mg | ORAL_TABLET | Freq: Every day | ORAL | 0 refills | Status: DC

## 2017-11-27 MED ORDER — WARFARIN SODIUM 1 MG PO TABS: 1.0000 mg | ORAL_TABLET | Freq: Once | ORAL | Status: DC

## 2017-11-27 MED ORDER — ATORVASTATIN CALCIUM 20 MG PO TABS: 20.0000 mg | ORAL_TABLET | Freq: Every day | ORAL | 0 refills | Status: DC

## 2017-11-27 MED ORDER — LACTULOSE 10 GM/15ML PO SOLN: 10.0000 g | Freq: Two times a day (BID) | ORAL | 0 refills | Status: DC | PRN

## 2017-11-27 NOTE — Discharge Summary (Addendum)
Physician Discharge Summary  Ryan Savage WUJ:[id number] DOB: 1928-12-25 DOA: 11/18/2017  PCP: Lajean Manes, MD  Admit date: 11/18/2017 Discharge date: 11/27/2017  Time spent: 35 minutes  Recommendations for Outpatient Follow-up:   Acute Systolic CHF/cardiomyopathy -negative prior history of CHF: CXR C/W pulmonary edemawith elevated BNP -strict I&O since admission  -1.0 L -Daily weight      Filed Weights    11/24/17 0443 11/25/17 0305 11/26/17 0428  Weight: 185 lb (83.9 kg) 178 lb 8 oz (81 kg) 177 lb (80.3 kg)  -1/11 S/P RIGHT LEFT artery catheterization: See results below   -Digoxin 0.125 mg daily -Lasix 20 mg daily -Toprol 75 mg daily -Discussed case with cardiology and they stated to patient stable overnight may be discharged -Schedule follow-up appointment with Dr. Fransico Him 1-3 weeks acute systolic CHF, chronic atrial fibrillation, SIADH.    Chronic Atrial fibrillation(CHA2DS2-VASc Score is 5)/Supratherapeutic INR -Coumadin on hold secondary to being supratherapeutic and patient requires cardiac catheterization -Currently in NSR Recent Labs  Lab 11/22/17 0150 11/23/17 0420 11/24/17 0429 11/25/17 0335 11/26/17 0326 11/27/17 0301  INR 1.77 2.55 3.23 3.42 2.88 2.94  -Therapeutic   Mitral valve prolapse -Per cardiology S/P cardiac mitral valve prolapse no current symptoms   Essential HTN  -See CHF   Severe Hyponatremia -Multifactorial hypervolemic secondary to CHF, SIADH (not observing fluid restriction)? -patient asymptomatic therefore current level of sodium must have been arrived at gradually: No history of altered mental status or seizure per patient and wife. -Patient sodium now in the mid 120s: Cardiology would like to stop Lasix in the future as an outpatient in order for [place] to determine if truly SIADH. -See SIADH and CHF -Schedule follow-up appointment with Dr. Lajean Manes 1-2 weeks severe hyponatremia, SIADH. (Consider D/Cing  Lasix in order to properly  evaluate for SIADH). Subclinical hyperthyroidism  SIADH -SIADH exacerbating CHF findings of hyponatremia. Continue fluid restrict to 1052ml/dy -Sodium improving with fluid restriction: Diuretics held secondary to borderline BP  -Per discussion with cardiology in the future they will DC his Lasix to allow PCP to truly evaluate patient for SIADH as an outpatient   Hyperglycemia -Resolved   Hypokalemia -Potassium goal> 4   Subclinical Hyperthyroidism? -TSH low normal, free T4 mildly elevated -T3 low normal -PCP to decide on further evaluation, if Endocrinology workup warranted.    Discharge Diagnoses:  Principal Problem:   Acute exacerbation of CHF (congestive heart failure) (HCC) Active Problems:   Permanent atrial fibrillation (HCC)   Benign essential HTN   Mitral valve prolapse   Hyponatremia with excess extracellular fluid volume   Hyperglycemia   Supratherapeutic INR   Acute on chronic systolic heart failure [facility])   Discharge Condition: Stable  Diet recommendation: Heart healthy  Filed Weights   11/25/17 0305 11/26/17 0428 11/27/17 0548  Weight: 178 lb 8 oz (81 kg) 177 lb (80.3 kg) 175 lb 12.8 oz (79.7 kg)    History of present illness:  84 y.o. WM PMHx  SIADH, OSA not on CPAP, MVP, HTN, HLD, thrombocytopenia, and afib on Coumadin    Presenting with SOB, weakness, chest congestion and coughing.  He reports having gone 3 weeks with no sleep.  Poor PO intake, no appetite.  He developed congestion 2-3 days ago with significant worsening of his SOB.  SOB is primarily with exertion.  Cough is a deep cough, very little phlegm that is dark, brown, or white; 1 time with a trace of blood.  No chest pain.  No confusion.  Weakness has  led to inability to do his usual activities, but prior to 3 weeks ago he was in [employer] and walking in the park.  Now, he can't even take the trash can to the street.  No fever.  No sick contacts.   ED Course:  Acute new CHF exacerbation  and hyponatremia.  Chronic afib on Coumadin.  SOB that worsened last night, malaise and unable to exercise, cough x 3 weeks.  Afib with RVR 130-140 with hypoxia (4L [place] O2).  CXR with pulmonary edema.  BNP 1400.  Given Lasix, breathing treatments, Cardizem drip.  During his hospitalization patient patient was treated for acute systolic CHF, chronic atrial fibrillation. Patient medication was adjusted by cardiology and he responded well. In addition patient's severe hyponatremia significantly improved although not within normal limit. Patient asymptomatic. Believed most likely secondary to CHF and possibly exacerbated by SIADH. Patient responded well to fluid restriction and CHF treatment. Stable for discharge.   Procedures: 1/9 Echocardiogram:Left ventricle: -Septal and apical akinesis Inferior wall   hypokinesis. -LVEF=35%. -study not technically sufficient to allow evaluation of LV diastolic   function. - Mitral valve: mild to moderate regurgitation. - Left atrium: moderately dilated.-Right atrium:severely dilated. 1/11 RIGHT/LEFT Cardiac Catheterization:. 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 Reviewed films with colleagues and we agree CTO-intervention would have a low likelihood of success in either the LAD or RCA secondary to anatomic features of heavy calcification and lesion length in the RCA and heavy calcification and nebulous stump in the LAD   Consultations: Cardiology      Discharge Exam: Vitals:   11/26/17 2324 11/27/17 0548 11/27/17 0814 11/27/17 0837  BP: 105/72 100/80 94/69 104/66  Pulse: 85 85 93 86  Resp: 20 (!) 21 18 16   Temp: 97.9 F (36.6 C) 97.6 F (36.4 C) 97.6 F (36.4 C)   TempSrc: Oral Oral Oral   SpO2: 98% 99% 96%   Weight:  175 lb 12.8 oz (79.7 kg)     Height:        General: A/O 4, No acute respiratory distress Neck:  Negative scars, masses, torticollis, lymphadenopathy, JVD Lungs: Clear to auscultation bilaterally without wheezes or crackles Cardiovascular: Irregular irregular rhythm and rate without murmur gallop or rub normal S1 and S2   Discharge Instructions   Allergies as of 11/27/2017      Reactions   Novocain [procaine] Rash      Medication List    STOP taking these medications   benazepril 40 MG tablet Commonly known as:  LOTENSIN   diltiazem 240 MG 24 hr capsule Commonly known as:  CARDIZEM CD   simvastatin 10 MG tablet Commonly known as:  ZOCOR     TAKE these medications   acetaminophen 325 MG tablet Commonly known as:  TYLENOL Take 650 mg by mouth every 6 (six) hours as needed for mild pain.   atorvastatin 20 MG tablet Commonly known as:  LIPITOR Take 1 tablet (20 mg total) by mouth daily at 6 PM.   digoxin 0.125 MG tablet Commonly known as:  LANOXIN Take 1 tablet (0.125 mg total) by mouth daily. Start taking on:  11/28/2017   finasteride 5 MG tablet Commonly known as:  PROSCAR Take 5 mg by mouth daily.   furosemide 20 MG tablet Commonly known as:  LASIX Take 1 tablet (20 mg total) by  mouth daily. Start taking on:  11/28/2017   lactulose 10 GM/15ML solution Commonly known as:  CHRONULAC Take 15 mLs (10 g total) by mouth 2 (two) times daily as needed for mild constipation.   metoprolol succinate 25 MG 24 hr tablet Commonly known as:  TOPROL-XL Take 3 tablets (75 mg total) by mouth daily. Start taking on:  11/28/2017   multivitamin capsule Take by mouth.   warfarin 3 MG tablet Commonly known as:  COUMADIN Take 0.5 tablets (1.5 mg total) by mouth daily at 6 PM. Ensure follow-up to Coumadin clinic on Monday for INR check What changed:    medication strength  how much to take  when to take this  additional instructions      Allergies  Allergen Reactions  . Novocain [Procaine]  Rash   Follow-up Information    Sueanne Margarita, MD. Schedule an appointment as soon as possible for a visit on 12/10/2017.   Specialty:  Cardiology Why:  Your follow up appointment is on 12/10/17 at 0740 with [name]. please arrive to your appointment at 0730. Thank you.  Contact information: [address] [place] [address] [phone]        Lajean Manes, MD. Schedule an appointment as soon as possible for a visit in 2 week(s).   Specialty:  Internal Medicine Why:  Schedule follow-up appointment with Dr. Lajean Manes 1-2 weeks severe hyponatremia, SIADH. (Consider D/Cing  Lasix in order to properly evaluate for SIADH). Subclinical hyperthyroidism Contact information: [address] [place] [address] [phone]            The results of significant diagnostics from this hospitalization (including imaging, microbiology, ancillary and laboratory) are listed below for reference.    Significant Diagnostic Studies: Dg Chest Portable 1 View  Result Date: 11/18/2017 CLINICAL DATA:  Cough.  Shortness of breath. EXAM: PORTABLE CHEST 1 VIEW COMPARISON:  CT 02/15/2010. FINDINGS: Cardiomegaly. Diffuse bilateral interstitial prominence. Changes consistent with interstitial edema and/or pneumonitis. No pleural effusion or pneumothorax. No acute bony abnormality. IMPRESSION: Cardiomegaly with diffuse bilateral pulmonary interstitial prominence consistent with interstitial edema. Pneumonitis cannot be excluded. Electronically Signed   By: Marcello Moores  Register   On: 11/18/2017 07:14    Microbiology: Recent Results (from the past 240 hour(s))  Respiratory Panel by PCR     Status: None   Collection Time: 11/18/17  8:55 AM  Result Value Ref Range Status   Adenovirus NOT DETECTED NOT DETECTED Final   Coronavirus 229E NOT DETECTED NOT DETECTED Final   Coronavirus HKU1 NOT DETECTED NOT DETECTED Final   Coronavirus NL63 NOT DETECTED NOT DETECTED Final    Coronavirus OC43 NOT DETECTED NOT DETECTED Final   Metapneumovirus NOT DETECTED NOT DETECTED Final   Rhinovirus / Enterovirus NOT DETECTED NOT DETECTED Final   Influenza A NOT DETECTED NOT DETECTED Final   Influenza B NOT DETECTED NOT DETECTED Final   Parainfluenza Virus 1 NOT DETECTED NOT DETECTED Final   Parainfluenza Virus 2 NOT DETECTED NOT DETECTED Final   Parainfluenza Virus 3 NOT DETECTED NOT DETECTED Final   Parainfluenza Virus 4 NOT DETECTED NOT DETECTED Final   Respiratory Syncytial Virus NOT DETECTED NOT DETECTED Final   Bordetella pertussis NOT DETECTED NOT DETECTED Final   Chlamydophila pneumoniae NOT DETECTED NOT DETECTED Final   Mycoplasma pneumoniae NOT DETECTED NOT DETECTED Final     Labs: Basic Metabolic Panel: Recent Labs  Lab 11/21/17 0125  11/22/17 0150  11/23/17 0420 11/24/17 0429 11/25/17  0335 11/26/17 0326 11/27/17 0946  NA 120*   < > 122*  123*   < > 123* 124* 127* 126* 129*  K 3.8  --  3.2*  --  3.9 3.9 4.0 3.9 4.0  CL 79*  --  85*  --  87* 88* 87* 88* 90*  CO2 29  --  27  --  25 26 29 26 30   GLUCOSE 127*  --  110*  --  101* 95 107* 101* 100*  BUN 24*  --  29*  --  22* 21* 18 17 19   CREATININE 0.95  --  0.89  --  0.85 0.84 0.99 1.03 1.16  CALCIUM 7.8*  --  7.8*  --  8.0* 8.0* 8.2* 8.4* 8.5*  MG 2.0  --  1.9  --  1.8 2.0  --   --  2.0   < > = values in this interval not displayed.   Liver Function Tests: Recent Labs  Lab 11/25/17 0335  AST 27  ALT 27  ALKPHOS 67  BILITOT 1.3*  PROT 5.8*  ALBUMIN 2.6*   No results for input(s): LIPASE, AMYLASE in the last 168 hours. No results for input(s): AMMONIA in the last 168 hours. CBC: Recent Labs  Lab 11/23/17 0420 11/24/17 0429 11/25/17 0335 11/26/17 0326 11/27/17 0301  WBC 13.2* 11.1* 11.2* 10.0 9.2  HGB 13.3 12.9* 13.4 13.2 13.0  HCT 38.0* 37.5* 38.4* 38.1* 38.0*  MCV 88.6 89.3 89.1 90.1 90.5  PLT 260 243 217 198 193   Cardiac Enzymes: No results for input(s): CKTOTAL, CKMB,  CKMBINDEX, TROPONINI in the last 168 hours. BNP: BNP (last 3 results) Recent Labs    11/18/17 0855  BNP 1,421.3*    ProBNP (last 3 results) No results for input(s): PROBNP in the last 8760 hours.  CBG: Recent Labs  Lab 11/22/17 0629  GLUCAP 127*       Signed:  Dia Crawford, MD [facility] [phone] pager

## 2017-11-27 NOTE — Progress Notes (Signed)
Progress Note  Patient Name: Ryan Savage Date of Encounter: 11/27/2017  Primary Cardiologist: Radford Pax  Subjective   He actually feels better today.  For the first night he did not have significant nasal drainage. Does not feel short of breath.  Has been walking in the hallway.  Is ready to go home.  Inpatient Medications    Scheduled Meds: . aspirin EC  81 mg Oral Daily  . atorvastatin  20 mg Oral q1800  . digoxin  0.125 mg Oral Daily  . finasteride  5 mg Oral Daily  . furosemide  20 mg Oral Daily  . metoprolol succinate  75 mg Oral Daily  . senna-docusate  1 tablet Oral BID  . warfarin  1 mg Oral ONCE-1800  . Warfarin - Pharmacist Dosing Inpatient   Does not apply q1800   Continuous Infusions:  PRN Meds: acetaminophen, alum & mag hydroxide-simeth, bisacodyl, guaiFENesin-dextromethorphan, lactulose, lip balm, loratadine, magnesium citrate, MUSCLE RUB, ondansetron (ZOFRAN) IV, phenol, polyvinyl alcohol, sodium chloride   Vital Signs    Vitals:   11/26/17 2324 11/27/17 0548 11/27/17 0814 11/27/17 0837  BP: 105/72 100/80 94/69 104/66  Pulse: 85 85 93 86  Resp: 20 (!) 21 18 16   Temp: 97.9 F (36.6 C) 97.6 F (36.4 C) 97.6 F (36.4 C)   TempSrc: Oral Oral Oral   SpO2: 98% 99% 96%   Weight:  175 lb 12.8 oz (79.7 kg)    Height:        Intake/Output Summary (Last 24 hours) at 11/27/2017 1039 Last data filed at 11/27/2017 0816 Gross per 24 hour  Intake 600 ml  Output -  Net 600 ml   Filed Weights   11/25/17 0305 11/26/17 0428 11/27/17 0548  Weight: 178 lb 8 oz (81 kg) 177 lb (80.3 kg) 175 lb 12.8 oz (79.7 kg)    Telemetry    Rate controlled A. fib in the 80s-90s.- Personally Reviewed  Physical Exam   General appearance: alert, cooperative, no distress and Appears to be younger than stated age.  Pleasant mood and affect. Neck: no carotid bruit and no JVD Lungs: clear to auscultation bilaterally, normal percussion bilaterally and Nonlabored Heart:  irregularly irregular rhythm and Controlled rate.  Normal S1 1 S2 with no rubs or gallops.  Soft systolic murmur heard at the sternal border. Abdomen: soft, non-tender; bowel sounds normal; no masses,  no organomegaly Extremities: extremities normal, atraumatic, no cyanosis or edema Pulses: 2+ and symmetric Neurologic: Grossly normal  Labs    Chemistry Recent Labs  Lab 11/24/17 0429 11/25/17 0335 11/26/17 0326  NA 124* 127* 126*  K 3.9 4.0 3.9  CL 88* 87* 88*  CO2 26 29 26   GLUCOSE 95 107* 101*  BUN 21* 18 17  CREATININE 0.84 0.99 1.03  CALCIUM 8.0* 8.2* 8.4*  PROT  --  5.8*  --   ALBUMIN  --  2.6*  --   AST  --  27  --   ALT  --  27  --   ALKPHOS  --  67  --   BILITOT  --  1.3*  --   GFRNONAA >60 >60 >60  GFRAA >60 >60 >60  ANIONGAP 10 11 12      Hematology Recent Labs  Lab 11/25/17 0335 11/26/17 0326 11/27/17 0301  WBC 11.2* 10.0 9.2  RBC 4.31 4.23 4.20*  HGB 13.4 13.2 13.0  HCT 38.4* 38.1* 38.0*  MCV 89.1 90.1 90.5  MCH 31.1 31.2 31.0  MCHC 34.9 34.6 34.2  RDW 13.3 13.6 13.5  PLT 217 198 193    Cardiac EnzymesNo results for input(s): TROPONINI in the last 168 hours. No results for input(s): TROPIPOC in the last 168 hours.   BNPNo results for input(s): BNP, PROBNP in the last 168 hours.   DDimer No results for input(s): DDIMER in the last 168 hours.    Radiology    No results found.  Cardiac Studies   Cath (11/21/17):  1. 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 Patent left main and circumflex/intermediate branches 3. Low right and left sided intracardiac filling pressures 4. Known severe LV systolic dysfunction  Recommend:  Medical therapy for [name]. Reviewed films with colleagues and we agree CTO-intervention would have a low likelihood of success in either the LAD or RCA secondary to anatomic features of heavy calcification and lesion length in the RCA and heavy calcification and  nebulous stump in the LAD  Resume warfarin tonight  With low filling pressures, hold diuretics x 24 hours and resume oral diuretics thereafter  Mitral regurgitation by most recent echo appears only mild-moderate. Cath shows no significant V-waves. Doubt mitral regurgitation is playing a major role at present.  TTE: 11/19/16  Study Conclusions  - Left ventricle: Septal and apical akinesis Inferior wall hypokinesis. The estimated ejection fraction was 35%. The study is not technically sufficient to allow evaluation of LV diastolic function. - Aortic valve: There was mild regurgitation. - Mitral valve: There was mild to moderate regurgitation. - Left atrium: The atrium was moderately dilated. - Right atrium: The atrium was severely dilated. - Atrial septum: No defect or patent foramen ovale was identified.  Patient Profile     84 y.o. male PMH ofposterior MV leaflet prolapse withmoderate MR, HTN, chronic Afib, BPH, HTN, HLD, SIADH, thrombocytopeniawho presented with ongoing cough and shortness of breath. Found to have EF of 35%.  Assessment & Plan    1. Acute combined systolic and diastolic heart failure/ ischemic cardiomyopathy: Weight Now down to 175, net negative about 1 L overall.  Euvolemic on exam.  Lasix restarted, I reduce the dose to 20 mg daily.  Discussed sliding scale Lasix --> taking 40 mg daily if weight goes up more than 3 pounds from dry weight upon returning home.  Ischemic cardiomyopathy with significantly reduced EF: No signs of abnormal rhythms patient beyond his baseline A. Fib. -->  Consider reevaluating EF in 3 months to reassess, but not likely to improve based on no revascularization options.  2. Chronic atrial fibrillation with RVR: Rate controlled on Toprol and digoxin.  Therapeutic INR.  Finally in range.. This patients CHA2DS2-VASc Score and unadjusted Ischemic Stroke Rate (% per year) is equal to 7.2 % stroke rate/year from a score of  5 Above score calculated as 1 point each if present [CHF, HTN, DM, Vascular=MI/PAD/Aortic Plaque, Age if 57-74, or Male];Above score calculated as 2 points each if present [Age > 75, or Stroke/TIA/TE]  3. MV prolapse with moderate MR: not as significant on current echo.we'll need to follow closely given his [name]. Concern for worsening ischemic MR.  4. HTN: Borderline hypotension.  But tolerating low-dose Toprol  5. HL: Continue statin  6. Hyponatremia/ SIADH: --Sodium is stabilized now.  Will probably need it rechecked in the outpatient setting.  7. [name]: severe 2 vessel disease with occluded LAD and RCA extensive collaterals. Plan for medical management.  Continue aspirin, statin and beta-blocker.  With no active anginal symptoms, no  need for nitrate at this time.  If blood pressure would tolerate, could consider adding ARB for afterload reduction.   Would consider cardiac rehab in the outpatient setting, but will defer to his primary cardiologist. I suspect he is ready for discharge.  Signing off.   Signed, Glenetta Hew, MD  11/27/2017, 10:39 AM  Pager # [phone]   For questions or updates, please contact [facility] Please consult [email or web address] for contact info under Cardiology/STEMI.

## 2017-11-27 NOTE — Care Management Note (Addendum)
Case Management Note Marvetta Gibbons RN, BSN Unit 4E-Case Manager [phone]  Patient Details  Name: Ryan Savage MRN: [id number] Date of Birth: October 14, 1929  Subjective/Objective:   Pt admitted with Acute CHF                 Action/Plan: PTA pt lived at home with spouse- per PT eval recommendation for [facility] f/u- will need orders prior to discharge- CM to follow for transition of care needs  Expected Discharge Date:     11/27/17             Expected Discharge Plan:  Home/home health   In-House Referral:     Discharge planning Services  CM Consult  Post Acute Care Choice:  Home health Choice offered to:  Pt/spouse  DME Arranged:    DME Agency:     HH Arranged:   RN, PT, aide [place] Agency:   [facility]  Status of Service:  Completed, signed off  If discussed at [place] of Stay Meetings, dates discussed:    Discharge Disposition: Home/home health   Additional Comments:  11/27/17- 1500- Ryan Lapenna RN, CM- pt for d/c home today with spouse- spoke with MD who will place [facility] orders for RN/PT/aide- spoke with pt and wife at bedside choice offered for [facility] agency- per wife they would like to use [facility] for [facility] services- referral called to Butch Penny with [facility] for HHRN/PT/aide- referral accepted- no other transitional needs noted.   Ryan Patricia, RN 11/27/2017, 12:35 PM

## 2017-11-27 NOTE — Progress Notes (Signed)
ANTICOAGULATION CONSULT NOTE - Follow Up Consult  Pharmacy Consult for Warfarin Indication: atrial fibrillation  Allergies  Allergen Reactions  . Novocain [Procaine] Rash    Patient Measurements: Height: 5\' 10"  (177.8 cm) Weight: 175 lb 12.8 oz (79.7 kg) IBW/kg (Calculated) : 73 Heparin Dosing Weight: 86.7 kg  Vital Signs: Temp: 97.6 F (36.4 C) (01/17 0814) Temp Source: Oral (01/17 0814) BP: 104/66 (01/17 0837) Pulse Rate: 86 (01/17 0837)  Labs: Recent Labs    11/25/17 0335 11/26/17 0326 11/27/17 0301  HGB 13.4 13.2 13.0  HCT 38.4* 38.1* 38.0*  PLT 217 198 193  LABPROT 34.3* 29.9* 30.4*  INR 3.42 2.88 2.94  CREATININE 0.99 1.03  --     Assessment: 47 yom presented to ED on 1/8 with SOB.  Hx of Afib on warfarin PTA, INR >10 on admit. INR reversed with vitamin k (5mg  on 1/8 and 2.5mg  x2 on 1/10) pending cath. Heparin bridge started 1/11 when INR fell below 2. Underwent cath 1/11 and heparin resumed post-cath as bridge to warfarin.   Heparin d/c'ed 1/13 with therapeutic INR. INR then trended up and warfarin held for 2 doses. INR therapeutic today 2.94. CBC stable. No bleeding.  PTA dose: 4mg  x 1 day, 3.5mg  x 2 days, then repeat cycle - last dose 4mg  on 1/7  Goal of Therapy:  INR 2-3 Monitor platelets by anticoagulation protocol: Yes   Plan:  1) Warfarin 1mg  tonight 2) Daily INR  Nena Jordan, PharmD, BCPS 11/27/2017 9:10 AM

## 2017-12-08 ENCOUNTER — Observation Stay (HOSPITAL_COMMUNITY): Payer: Medicare Other

## 2017-12-08 ENCOUNTER — Other Ambulatory Visit: Payer: Self-pay

## 2017-12-08 ENCOUNTER — Encounter (HOSPITAL_COMMUNITY): Payer: Self-pay

## 2017-12-08 ENCOUNTER — Emergency Department (HOSPITAL_COMMUNITY): Payer: Medicare Other

## 2017-12-08 ENCOUNTER — Inpatient Hospital Stay (HOSPITAL_COMMUNITY)
Admission: EM | Admit: 2017-12-08 | Discharge: 2017-12-10 | DRG: 065 | Disposition: A | Discharge status: Home Health | Payer: Medicare Other | Attending: Family Medicine | Admitting: Family Medicine

## 2017-12-08 DIAGNOSIS — I639 Cerebral infarction, unspecified: Secondary | ICD-10-CM

## 2017-12-08 DIAGNOSIS — E860 Dehydration: Secondary | ICD-10-CM | POA: Diagnosis present

## 2017-12-08 DIAGNOSIS — R471 Dysarthria and anarthria: Secondary | ICD-10-CM | POA: Diagnosis present

## 2017-12-08 DIAGNOSIS — I482 Chronic atrial fibrillation: Secondary | ICD-10-CM | POA: Diagnosis present

## 2017-12-08 DIAGNOSIS — G960 Cerebrospinal fluid leak: Secondary | ICD-10-CM | POA: Diagnosis present

## 2017-12-08 DIAGNOSIS — G4733 Obstructive sleep apnea (adult) (pediatric): Secondary | ICD-10-CM | POA: Diagnosis present

## 2017-12-08 DIAGNOSIS — R1310 Dysphagia, unspecified: Secondary | ICD-10-CM

## 2017-12-08 DIAGNOSIS — I110 Hypertensive heart disease with heart failure: Secondary | ICD-10-CM | POA: Diagnosis present

## 2017-12-08 DIAGNOSIS — Z85828 Personal history of other malignant neoplasm of skin: Secondary | ICD-10-CM

## 2017-12-08 DIAGNOSIS — R0789 Other chest pain: Secondary | ICD-10-CM

## 2017-12-08 DIAGNOSIS — I959 Hypotension, unspecified: Secondary | ICD-10-CM | POA: Diagnosis present

## 2017-12-08 DIAGNOSIS — Z6825 Body mass index (BMI) 25.0-25.9, adult: Secondary | ICD-10-CM

## 2017-12-08 DIAGNOSIS — E440 Moderate protein-calorie malnutrition: Secondary | ICD-10-CM

## 2017-12-08 DIAGNOSIS — E785 Hyperlipidemia, unspecified: Secondary | ICD-10-CM | POA: Diagnosis present

## 2017-12-08 DIAGNOSIS — G4700 Insomnia, unspecified: Secondary | ICD-10-CM | POA: Diagnosis present

## 2017-12-08 DIAGNOSIS — R4701 Aphasia: Secondary | ICD-10-CM | POA: Diagnosis present

## 2017-12-08 DIAGNOSIS — I5022 Chronic systolic (congestive) heart failure: Secondary | ICD-10-CM | POA: Diagnosis present

## 2017-12-08 DIAGNOSIS — E222 Syndrome of inappropriate secretion of antidiuretic hormone: Secondary | ICD-10-CM | POA: Diagnosis present

## 2017-12-08 DIAGNOSIS — Z87891 Personal history of nicotine dependence: Secondary | ICD-10-CM

## 2017-12-08 DIAGNOSIS — R55 Syncope and collapse: Secondary | ICD-10-CM | POA: Diagnosis present

## 2017-12-08 DIAGNOSIS — I6340 Cerebral infarction due to embolism of unspecified cerebral artery: Secondary | ICD-10-CM

## 2017-12-08 DIAGNOSIS — I2510 Atherosclerotic heart disease of native coronary artery without angina pectoris: Secondary | ICD-10-CM | POA: Diagnosis present

## 2017-12-08 DIAGNOSIS — I63512 Cerebral infarction due to unspecified occlusion or stenosis of left middle cerebral artery: Secondary | ICD-10-CM | POA: Diagnosis not present

## 2017-12-08 DIAGNOSIS — Z7901 Long term (current) use of anticoagulants: Secondary | ICD-10-CM

## 2017-12-08 DIAGNOSIS — R29705 NIHSS score 5: Secondary | ICD-10-CM | POA: Diagnosis present

## 2017-12-08 DIAGNOSIS — Z8249 Family history of ischemic heart disease and other diseases of the circulatory system: Secondary | ICD-10-CM

## 2017-12-08 DIAGNOSIS — E039 Hypothyroidism, unspecified: Secondary | ICD-10-CM | POA: Diagnosis present

## 2017-12-08 DIAGNOSIS — N400 Enlarged prostate without lower urinary tract symptoms: Secondary | ICD-10-CM | POA: Diagnosis present

## 2017-12-08 LAB — RAPID URINE DRUG SCREEN, HOSP PERFORMED
Amphetamines: NOT DETECTED
Barbiturates: NOT DETECTED
Benzodiazepines: NOT DETECTED
Cocaine: NOT DETECTED
Opiates: NOT DETECTED
Tetrahydrocannabinol: NOT DETECTED

## 2017-12-08 LAB — CBC
HCT: 40.3 % (ref 39.0–52.0)
Hemoglobin: 13.5 g/dL (ref 13.0–17.0)
MCH: 31.5 pg (ref 26.0–34.0)
MCHC: 33.5 g/dL (ref 30.0–36.0)
MCV: 93.9 fL (ref 78.0–100.0)
Platelets: 190 10*3/uL (ref 150–400)
RBC: 4.29 MIL/uL (ref 4.22–5.81)
RDW: 14.1 % (ref 11.5–15.5)
WBC: 8.9 10*3/uL (ref 4.0–10.5)

## 2017-12-08 LAB — FERRITIN: Ferritin: 717 ng/mL — ABNORMAL HIGH (ref 24–336)

## 2017-12-08 LAB — COMPREHENSIVE METABOLIC PANEL
ALT: 17 U/L (ref 17–63)
AST: 22 U/L (ref 15–41)
Albumin: 3 g/dL — ABNORMAL LOW (ref 3.5–5.0)
Alkaline Phosphatase: 53 U/L (ref 38–126)
Anion gap: 11 (ref 5–15)
BUN: 31 mg/dL — ABNORMAL HIGH (ref 6–20)
CO2: 27 mmol/L (ref 22–32)
Calcium: 8.7 mg/dL — ABNORMAL LOW (ref 8.9–10.3)
Chloride: 95 mmol/L — ABNORMAL LOW (ref 101–111)
Creatinine, Ser: 1.09 mg/dL (ref 0.61–1.24)
GFR calc Af Amer: >60 mL/min (ref >60)
GFR calc non Af Amer: 59 mL/min — ABNORMAL LOW (ref >60)
Glucose, Bld: 126 mg/dL — ABNORMAL HIGH (ref 65–99)
Potassium: 4.1 mmol/L (ref 3.5–5.1)
Sodium: 133 mmol/L — ABNORMAL LOW (ref 135–145)
Total Bilirubin: 1 mg/dL (ref 0.3–1.2)
Total Protein: 6.9 g/dL (ref 6.5–8.1)

## 2017-12-08 LAB — URINALYSIS, ROUTINE W REFLEX MICROSCOPIC
Bilirubin Urine: NEGATIVE
Glucose, UA: NEGATIVE mg/dL
Hgb urine dipstick: NEGATIVE
Ketones, ur: NEGATIVE mg/dL
Leukocytes, UA: NEGATIVE
Nitrite: NEGATIVE
Protein, ur: NEGATIVE mg/dL
Specific Gravity, Urine: 1.015 (ref 1.005–1.030)
pH: 8 (ref 5.0–8.0)

## 2017-12-08 LAB — VITAMIN B12: Vitamin B-12: 772 pg/mL (ref 180–914)

## 2017-12-08 LAB — DIFFERENTIAL
Basophils Absolute: 0.1 10*3/uL (ref 0.0–0.1)
Basophils Relative: 1 %
Eosinophils Absolute: 0.1 10*3/uL (ref 0.0–0.7)
Eosinophils Relative: 1 %
Lymphocytes Relative: 24 %
Lymphs Abs: 2.2 10*3/uL (ref 0.7–4.0)
Monocytes Absolute: 0.9 10*3/uL (ref 0.1–1.0)
Monocytes Relative: 10 %
Neutro Abs: 5.7 10*3/uL (ref 1.7–7.7)
Neutrophils Relative %: 64 %

## 2017-12-08 LAB — RETICULOCYTES
RBC.: 4.47 MIL/uL (ref 4.22–5.81)
Retic Count, Absolute: 93.9 10*3/uL (ref 19.0–186.0)
Retic Ct Pct: 2.1 % (ref 0.4–3.1)

## 2017-12-08 LAB — TSH: TSH: 0.773 u[IU]/mL (ref 0.350–4.500)

## 2017-12-08 LAB — APTT: aPTT: 30 seconds (ref 24–36)

## 2017-12-08 LAB — IRON AND TIBC
Iron: 51 ug/dL (ref 45–182)
Saturation Ratios: 20 % (ref 17.9–39.5)
TIBC: 249 ug/dL — ABNORMAL LOW (ref 250–450)
UIBC: 198 ug/dL

## 2017-12-08 LAB — I-STAT CHEM 8, ED
BUN: 35 mg/dL — ABNORMAL HIGH (ref 6–20)
Calcium, Ion: 1.05 mmol/L — ABNORMAL LOW (ref 1.15–1.40)
Chloride: 95 mmol/L — ABNORMAL LOW (ref 101–111)
Creatinine, Ser: 1 mg/dL (ref 0.61–1.24)
Glucose, Bld: 122 mg/dL — ABNORMAL HIGH (ref 65–99)
HCT: 41 % (ref 39.0–52.0)
Hemoglobin: 13.9 g/dL (ref 13.0–17.0)
Potassium: 4.2 mmol/L (ref 3.5–5.1)
Sodium: 133 mmol/L — ABNORMAL LOW (ref 135–145)
TCO2: 31 mmol/L (ref 22–32)

## 2017-12-08 LAB — ETHANOL: Alcohol, Ethyl (B): <10 mg/dL (ref <10)

## 2017-12-08 LAB — PROTIME-INR
INR: 1.74
Prothrombin Time: 20.2 seconds — ABNORMAL HIGH (ref 11.4–15.2)

## 2017-12-08 LAB — FOLATE: Folate: 27 ng/mL (ref >5.9)

## 2017-12-08 LAB — I-STAT TROPONIN, ED: Troponin i, poc: 0.05 ng/mL (ref 0.00–0.08)

## 2017-12-08 LAB — CBG MONITORING, ED: Glucose-Capillary: 116 mg/dL — ABNORMAL HIGH (ref 65–99)

## 2017-12-08 MED ORDER — DIGOXIN 0.25 MG/ML IJ SOLN
0.1250 mg | Freq: Every day | INTRAMUSCULAR | Status: DC
  Administered 2017-12-08 – 2017-12-09 (×2): 0.125 mg via INTRAVENOUS
  Filled 2017-12-08: qty 0.5
  Filled 2017-12-08: qty 2

## 2017-12-08 MED ORDER — IOPAMIDOL (ISOVUE-370) INJECTION 76%
INTRAVENOUS | Status: AC
  Administered 2017-12-08: 50 mL
  Filled 2017-12-08: qty 50

## 2017-12-08 MED ORDER — PANTOPRAZOLE SODIUM 40 MG PO TBEC
40.0000 mg | DELAYED_RELEASE_TABLET | Freq: Every day | ORAL | Status: DC
  Administered 2017-12-09: 40 mg via ORAL
  Filled 2017-12-08: qty 1

## 2017-12-08 MED ORDER — ACETAMINOPHEN 650 MG RE SUPP
650.0000 mg | RECTAL | Status: DC | PRN
Start: 2017-12-08 — End: 2017-12-10

## 2017-12-08 MED ORDER — PANTOPRAZOLE SODIUM 40 MG IV SOLR
40.0000 mg | INTRAVENOUS | Status: DC
  Administered 2017-12-08: 40 mg via INTRAVENOUS
  Filled 2017-12-08: qty 40

## 2017-12-08 MED ORDER — ASPIRIN EC 325 MG PO TBEC
325.0000 mg | DELAYED_RELEASE_TABLET | Freq: Every day | ORAL | Status: DC
  Administered 2017-12-09 – 2017-12-10 (×2): 325 mg via ORAL
  Filled 2017-12-08 (×2): qty 1

## 2017-12-08 MED ORDER — ATORVASTATIN CALCIUM 40 MG PO TABS: 40.0000 mg | ORAL_TABLET | Freq: Every day | ORAL | Status: DC

## 2017-12-08 MED ORDER — ACETAMINOPHEN 160 MG/5ML PO SOLN: 650.0000 mg | ORAL | Status: DC | PRN

## 2017-12-08 MED ORDER — STROKE: EARLY STAGES OF RECOVERY BOOK
Freq: Once | Status: AC
  Administered 2017-12-08: 22:00:00
  Filled 2017-12-08: qty 1

## 2017-12-08 MED ORDER — ACETAMINOPHEN 325 MG PO TABS
650.0000 mg | ORAL_TABLET | ORAL | Status: DC | PRN
  Administered 2017-12-08 (×2): 650 mg via ORAL
  Filled 2017-12-08 (×2): qty 2

## 2017-12-08 MED ORDER — WARFARIN SODIUM 2 MG PO TABS
2.0000 mg | ORAL_TABLET | Freq: Once | ORAL | Status: AC
  Administered 2017-12-09: 2 mg via ORAL
  Filled 2017-12-08: qty 1

## 2017-12-08 MED ORDER — WARFARIN - PHARMACIST DOSING INPATIENT
Freq: Every day | Status: DC
  Administered 2017-12-09: 17:00:00

## 2017-12-08 NOTE — Progress Notes (Signed)
ANTICOAGULATION CONSULT NOTE - Initial Consult  Pharmacy Consult for warfarin Indication: atrial fibrillation  Allergies  Allergen Reactions  . Novocain [Procaine] Rash    Patient Measurements: Height: 5\' 10"  (177.8 cm) Weight: 175 lb (79.4 kg) IBW/kg (Calculated) : 73   Vital Signs: Temp: 98.4 F (36.9 C) (01/28 2203) Temp Source: Oral (01/28 2203) BP: 103/56 (01/28 2203) Pulse Rate: 86 (01/28 2203)  Labs: Recent Labs    12/08/17 1230 12/08/17 1253  HGB 13.5 13.9  HCT 40.3 41.0  PLT 190  --   APTT 30  --   LABPROT 20.2*  --   INR 1.74  --   CREATININE 1.09 1.00    Estimated Creatinine Clearance: 54.8 mL/min (by C-G formula based on SCr of 1 mg/dL).   Medical History: Past Medical History:  Diagnosis Date  . Atrial fibrillation, chronic (HCC)    Chronic..coumadin clinic -Dr Radford Pax  . BPH (benign prostatic hypertrophy)   . CHF (congestive heart failure) ([place])   . [name] (degenerative joint disease)   . Hyperlipidemia    LDL goal less then 100  . Hypertension   . Microscopic hematuria    [name]  . Mitral valve prolapse    with moderate MR  . OSA (obstructive sleep apnea)    intolerant with CPAP (11/19/2017)  . SIADH (syndrome of inappropriate ADH production) ([place])   . Skin cancer    "side of my nose" (11/19/2017)  . Thrombocytopenia (HCC)    Mild- platelet count 143,000 on 02/2011, stable 08/2011    Assessment: Admitted with altered mental status, called as code stroke. tPA was not given due to the patient being on warfarin. Current INR is 1.7. Last dose of warfarin was on 1/27.  PTA warfarin: Pt takes 2 mg and 1 mg on alternating days  Goal of Therapy:  INR 2-3 Monitor platelets by anticoagulation protocol: Yes    Plan:  -Warfarin 2 mg po x1 -Daily INR -F/u plan for asa, MD recommends to stop when INR > 2   Harvel Quale 12/08/2017,11:08 PM

## 2017-12-08 NOTE — ED Triage Notes (Addendum)
Per [facility], Coming from home with complaints of altered mental status that was noted this morning at 1025 after the home health nurse left, but is currently Alert and Oriented x4 upon EMS arrival. Reported pt has been taking three metoprolol in the morning, but was told by PCP that he shouldn't be taking all these medications. Pt was recently seen for CHF in the hospital for ten days and was discharge. Hx of AFib and Coumadin. Fall this week on Friday and Saturday with left lower rib pain. 140/86, 80-120 Irregular, 98% on RA, 20 L FA, 148 CBG.

## 2017-12-08 NOTE — Code Documentation (Signed)
84yo male arriving to [facility] at 82 via [place].  Patient from home where he had difficulty speaking. LKW by home health nurse at 1025.  On arrival deficits seemed to have resolved, however, during assessment patient developed expressive aphasia and noted to have right arm drift. Code stroke activated.  Of note, patient with recent fall x 2.  Recently started taking Metoprolol.  Patient with h/o atrial fibrillation on Coumadin.  Patient to CT.  Stroke team to the bedside.  NIHSS 5, see documentation for details and code stroke times.  Patient with expressive aphasia on exam, some difficulty following commands as well and right arm and leg drift.  Labs resulted with PT 20.2 and INR 1.74. CT showing positive bilateral subdural hygromas or very low-density subdural hematomas, each 5-22mm in thickness per report.  Patient is contraindicated for treatment with tPA.  CTA head and neck completed.  No acute stroke treatment at this time.  Patient to be admitted for stroke work-up.  Bedside handoff with ED RN Jarrett Soho.

## 2017-12-08 NOTE — ED Notes (Addendum)
Speaking with patient and patient noted to have some significant word recall. Asked patient about this and he stated that it started at 1100. MD made aware of the patient's symptoms.   When asked by MD, pt stated it started at 1300 when he was eating lunch.

## 2017-12-08 NOTE — Consult Note (Addendum)
Requesting Physician: Dr. Ralene Bathe    Chief Complaint: Aphasia   History obtained from: Patient and Chart     HPI:                                                                                                                                       Brixon Fix is an 84 y.o. male with Afib on warfarin, HTN, HLD, SIADH, OSA, thrombocytopenia presents with acute aphasia this morning.  According to wife patient having breakfast dropped spoon from his hand and speaking with any sense. This was around 11 am, last seen normal by home health nurse at 10.25 am.  She called EMS and symptoms had improved on arrival. While in ED his symptoms became worse again and EDP noted right arm drift and stroke alerted the patient.   On assessment, patient was aphasic and right side weakness without obvious neglect. CT Head was performed showed no bleed, however showed bilateral subdural hematomas vs chronic SDH. TPA was considered as defecits were disabling, however not given due to patient being on Warfarin and INR was >1.7 (1.74). CTA negative for LVO.    Date last known well: 1.28.19 Time last known well: 10.25 am tPA Given: no, on anticoagulation NIHSS: 5  Baseline MRS 0   Past Medical History:  Diagnosis Date  . Atrial fibrillation, chronic (HCC)    Chronic..coumadin clinic -Dr Radford Pax  . BPH (benign prostatic hypertrophy)   . CHF (congestive heart failure) ([place])   . [name] (degenerative joint disease)   . Hyperlipidemia    LDL goal less then 100  . Hypertension   . Microscopic hematuria    [name]  . Mitral valve prolapse    with moderate MR  . OSA (obstructive sleep apnea)    intolerant with CPAP (11/19/2017)  . SIADH (syndrome of inappropriate ADH production) ([place])   . Skin cancer    "side of my nose" (11/19/2017)  . Thrombocytopenia (HCC)    Mild- platelet count 143,000 on 02/2011, stable 08/2011    Past Surgical History:  Procedure Laterality Date  . APPENDECTOMY  1950  . LUMBAR  DISC SURGERY  1959   ruptured disc repair  . RIGHT/LEFT HEART CATH AND CORONARY ANGIOGRAPHY N/A 11/21/2017   Procedure: RIGHT/LEFT HEART CATH AND CORONARY ANGIOGRAPHY;  Surgeon: Sherren Mocha, MD;  Location: [facility];  Service: Cardiovascular;  Laterality: N/A;  . SKIN CANCER EXCISION Left    "side of my nose"    Family History  Problem Relation Age of Onset  . Heart failure Mother   . Heart attack Father   . [name] Father   . Heart attack Brother   . [name] Brother   . Colon cancer Brother   . Colon cancer Sister    Social History:  reports that he quit smoking about 61 years ago. His smoking use included cigarettes. He quit after 3.00 years of  use. he has never used smokeless tobacco. He reports that he does not drink alcohol or use drugs.  Allergies:  Allergies  Allergen Reactions  . Novocain [Procaine] Rash    Medications:                                                                                                                        I reviewed home medications   ROS:                                                                                                                                     14 systems reviewed and negative except above    Examination:                                                                                                      General: Appears well-developed and well-nourished.  Psych: Affect appropriate to situation Eyes: No scleral injection HENT: No OP obstrucion Head: Normocephalic.  Cardiovascular: Normal rate and regular rhythm.  Respiratory: Effort normal and breath sounds normal to anterior ascultation GI: Soft.  No distension. There is no tenderness.  Skin: WDI   Neurological Examination Mental Status: Alert, oriented, moderate Expressive and comprehensive aphasia. Not  Able to follow complexcommands, Cranial Nerves: II: Visual fields grossly normal,  III,IV, VI: ptosis not present, extra-ocular motions intact  bilaterally, pupils equal, round, reactive to light and accommodation V,VII: smile symmetric, facial light touch sensation normal bilaterally VIII: hearing normal bilaterally IX,X: uvula rises symmetrically XI: bilateral shoulder shrug XII: midline tongue extension Motor: Right : Upper extremity   4/5    Left:     Upper extremity   5/5  Lower extremity   4/5     Lower extremity   5/5 Tone and bulk:normal tone throughout; no atrophy noted Sensory: Pinprick and light touch intact throughout, bilaterally Deep Tendon Reflexes: 2+ and symmetric throughout Plantars: Right: downgoing   Left: downgoing Cerebellar: normal finger-to-nose, normal rapid alternating movements and normal  heel-to-shin test Gait: normal gait and station     Lab Results: Basic Metabolic Panel: Recent Labs  Lab 12/08/17 1230 12/08/17 1253  NA 133* 133*  K 4.1 4.2  CL 95* 95*  CO2 27  --   GLUCOSE 126* 122*  BUN 31* 35*  CREATININE 1.09 1.00  CALCIUM 8.7*  --     CBC: Recent Labs  Lab 12/08/17 1230 12/08/17 1253  WBC 8.9  --   NEUTROABS 5.7  --   HGB 13.5 13.9  HCT 40.3 41.0  MCV 93.9  --   PLT 190  --     Coagulation Studies: Recent Labs    12/08/17 1230  LABPROT 20.2*  INR 1.74    Imaging: Ct Angio Head W Or Wo Contrast  Result Date: 12/08/2017 CLINICAL DATA:  Expressive aphasia and right-sided weakness. EXAM: CT ANGIOGRAPHY HEAD AND NECK TECHNIQUE: Multidetector CT imaging of the head and neck was performed using the standard protocol during bolus administration of intravenous contrast. Multiplanar CT image reconstructions and MIPs were obtained to evaluate the vascular anatomy. Carotid stenosis measurements (when applicable) are obtained utilizing NASCET criteria, using the distal internal carotid diameter as the denominator. CONTRAST:  35mL ISOVUE-370 IOPAMIDOL (ISOVUE-370) INJECTION 76% COMPARISON:  Head CT today. Chest radiograph 11/18/2017. No prior angiographic imaging. FINDINGS: CTA  NECK FINDINGS Aortic arch: Standard 3 vessel aortic arch with moderate volume calcified and soft plaque. Patent brachiocephalic and subclavian arteries with nonstenotic atherosclerotic plaque bilaterally. Right carotid system: Patent without evidence of stenosis or dissection. Left carotid system: Patent without evidence of stenosis or dissection. Mild calcified and soft plaque at the carotid bifurcation. Vertebral arteries: The vertebral arteries are patent with the left being slightly dominant. Predominantly calcified plaque at both vertebral artery origins results in likely mild stenosis on the left and at most minimal stenosis on the right. Skeleton: Moderate cervical disc degeneration. Other neck: No mass or lymph node enlargement. Upper chest: Biapical pleural-parenchymal scarring. Subpleural reticulation and peripheral ground-glass opacity in both upper lobes with widespread interstitial density throughout the lungs on the prior chest radiograph. Review of the MIP images confirms the above findings CTA HEAD FINDINGS Anterior circulation: The internal carotid arteries are patent from skull base to carotid termini with mild calcified siphon atherosclerosis resulting in at most mild paraclinoid ICA stenosis bilaterally. ACAs and MCAs are patent without evidence of proximal branch occlusion or significant proximal stenosis. No aneurysm. Posterior circulation: The intracranial vertebral arteries are patent with left greater than right V4 segment atherosclerosis resulting in luminal irregularity but no significant stenosis. Patent PICA and SCA origins are identified bilaterally. The basilar artery is widely patent. Posterior communicating arteries are diminutive or absent. PCAs are patent without evidence of significant proximal stenosis. No aneurysm. Venous sinuses: As permitted by contrast timing, patent. Anatomic variants: None. Delayed phase: Not performed. Review of the MIP images confirms the above findings  IMPRESSION: 1. No emergent large vessel occlusion. 2. Mild intracranial atherosclerosis with mild bilateral ICA narrowing. 3. Widely patent cervical carotid arteries. 4. Mild left vertebral artery origin stenosis. 5.  Aortic Atherosclerosis (ICD10-I70.0). 6. Biapical parenchymal lung changes concerning for interstitial lung disease. Preliminary findings of no large vessel occlusion were communicated to Dr. Lorraine Lax at 1:29 pm on 12/08/2017 by text page via the [facility] messaging system. Electronically Signed   By: Logan Bores M.D.   On: 12/08/2017 13:44   Ct Angio Neck W Or Wo Contrast  Result Date: 12/08/2017 CLINICAL DATA:  Expressive aphasia and right-sided  weakness. EXAM: CT ANGIOGRAPHY HEAD AND NECK TECHNIQUE: Multidetector CT imaging of the head and neck was performed using the standard protocol during bolus administration of intravenous contrast. Multiplanar CT image reconstructions and MIPs were obtained to evaluate the vascular anatomy. Carotid stenosis measurements (when applicable) are obtained utilizing NASCET criteria, using the distal internal carotid diameter as the denominator. CONTRAST:  50mL ISOVUE-370 IOPAMIDOL (ISOVUE-370) INJECTION 76% COMPARISON:  Head CT today. Chest radiograph 11/18/2017. No prior angiographic imaging. FINDINGS: CTA NECK FINDINGS Aortic arch: Standard 3 vessel aortic arch with moderate volume calcified and soft plaque. Patent brachiocephalic and subclavian arteries with nonstenotic atherosclerotic plaque bilaterally. Right carotid system: Patent without evidence of stenosis or dissection. Left carotid system: Patent without evidence of stenosis or dissection. Mild calcified and soft plaque at the carotid bifurcation. Vertebral arteries: The vertebral arteries are patent with the left being slightly dominant. Predominantly calcified plaque at both vertebral artery origins results in likely mild stenosis on the left and at most minimal stenosis on the right. Skeleton: Moderate  cervical disc degeneration. Other neck: No mass or lymph node enlargement. Upper chest: Biapical pleural-parenchymal scarring. Subpleural reticulation and peripheral ground-glass opacity in both upper lobes with widespread interstitial density throughout the lungs on the prior chest radiograph. Review of the MIP images confirms the above findings CTA HEAD FINDINGS Anterior circulation: The internal carotid arteries are patent from skull base to carotid termini with mild calcified siphon atherosclerosis resulting in at most mild paraclinoid ICA stenosis bilaterally. ACAs and MCAs are patent without evidence of proximal branch occlusion or significant proximal stenosis. No aneurysm. Posterior circulation: The intracranial vertebral arteries are patent with left greater than right V4 segment atherosclerosis resulting in luminal irregularity but no significant stenosis. Patent PICA and SCA origins are identified bilaterally. The basilar artery is widely patent. Posterior communicating arteries are diminutive or absent. PCAs are patent without evidence of significant proximal stenosis. No aneurysm. Venous sinuses: As permitted by contrast timing, patent. Anatomic variants: None. Delayed phase: Not performed. Review of the MIP images confirms the above findings IMPRESSION: 1. No emergent large vessel occlusion. 2. Mild intracranial atherosclerosis with mild bilateral ICA narrowing. 3. Widely patent cervical carotid arteries. 4. Mild left vertebral artery origin stenosis. 5.  Aortic Atherosclerosis (ICD10-I70.0). 6. Biapical parenchymal lung changes concerning for interstitial lung disease. Preliminary findings of no large vessel occlusion were communicated to Dr. Lorraine Lax at 1:29 pm on 12/08/2017 by text page via the [facility] messaging system. Electronically Signed   By: Logan Bores M.D.   On: 12/08/2017 13:44   Ct Head Code Stroke Wo Contrast  Result Date: 12/08/2017 CLINICAL DATA:  Code stroke. 84 year old male with  expressive aphasia and right side weakness. Initial encounter. EXAM: CT HEAD WITHOUT CONTRAST TECHNIQUE: Contiguous axial images were obtained from the base of the skull through the vertex without intravenous contrast. COMPARISON:  None. FINDINGS: Brain: Bilateral low-density subdural collections, each measuring 5-6 millimeters in thickness. See coronal image 26. No hyperdense intracranial hemorrhage identified. No midline shift. Basilar cisterns remain normal. Normal for age bilateral gray-white matter differentiation. No cortically based acute infarct identified. No cortical encephalomalacia identified. No ventriculomegaly. Vascular: Calcified atherosclerosis at the skull base. No suspicious intracranial vascular hyperdensity. Skull: Intact.  No acute osseous abnormality identified. Sinuses/Orbits: Clear. Other: Visualized orbit soft tissues are within normal limits. Visualized scalp soft tissues are within normal limits. ASPECTS [facility] Stroke Program Early CT Score) - Ganglionic level infarction (caudate, lentiform nuclei, internal capsule, insula, M1-M3 cortex): 7 - Supraganglionic infarction (M4-M6  cortex): 3 Total score (0-10 with 10 being normal): 10 IMPRESSION: 1. Positive for bilateral subdural hygromas or very low-density subdural hematomas, each 5-6 mm in thickness. No midline shift. Patent basilar cisterns. 2. No superimposed acute cortically based infarct or hyperdense intracranial hemorrhage identified. 3. ASPECTS is 10. 4. These results were communicated to Dr. Lorraine Lax at 1:06 pmon 1/28/2019by text page via the [facility] messaging system. Electronically Signed   By: Genevie Ann M.D.   On: 12/08/2017 13:06     ASSESSMENT AND PLAN  84 y.o. male with Afib on warfarin, HTN, HLD, SIADH, OSA, thrombocytopenia presents with acute aphasia this morning. tPA not given as patient on warfarin and INR was 1.74. Also patient has bilateral subdural hygroma vs SDH.  CTA negative for LVO.    Acute Ischemic Left MCA  stroke  Etiology : Atrial fibrillation - subtherapeutic INR  Hold tonight warfarin, ASA - decision to restart needs to be made after MRI after seeing size of infarct.  Also MRI brain will help differentiate if he SDH vs hydroma.  #Transthoracic Echo  #Start or continue Atorvastatin 40 mg/other high intensity statin # BP goal: permissive HTN upto 425 systolic, PRNs above 956 # HBAIC and Lipid profile # Telemetry monitoring # Frequent neuro checks # NPO until passes stroke swallow screen  Please page stroke NP  Or  PA  Or MD from 8am -4 pm  as this patient from this time will be  followed by the stroke.   You can look them up on [email or web address]  Password [facility]   Antione Obar [facility] Pager Number [phone]

## 2017-12-08 NOTE — ED Notes (Signed)
Pt c/o L frontal headache.

## 2017-12-08 NOTE — ED Provider Notes (Signed)
[facility] EMERGENCY DEPARTMENT Provider Note   CSN: [id number] Arrival date & time: 12/08/17  1159     History   Chief Complaint Chief Complaint  Patient presents with  . Altered Mental Status    HPI Dublin Sult is a 84 y.o. male.  The history is provided by the patient. No language interpreter was used.  Altered Mental Status     Dillon Pfeifle is a 84 y.o. male who presents to the Emergency Department complaining of AMS.  Level V caveat due to AMS.  He was last known well at 1025 this morning when home health nursing was present.  About 11:00 he began to have difficulty with his right arm and difficulty with speaking.  He states that his arm is feeling better but he is not speaking correctly.  No headache, chest pain.  He was recently admitted to the hospital for atrial fibrillation and currently takes Coumadin.  He has had a few falls recently. Past Medical History:  Diagnosis Date  . Atrial fibrillation, chronic (HCC)    Chronic..coumadin clinic -Dr Radford Pax  . BPH (benign prostatic hypertrophy)   . CHF (congestive heart failure) ([place])   . [name] (degenerative joint disease)   . Hyperlipidemia    LDL goal less then 100  . Hypertension   . Microscopic hematuria    [name]  . Mitral valve prolapse    with moderate MR  . OSA (obstructive sleep apnea)    intolerant with CPAP (11/19/2017)  . SIADH (syndrome of inappropriate ADH production) ([place])   . Skin cancer    "side of my nose" (11/19/2017)  . Thrombocytopenia (HCC)    Mild- platelet count 143,000 on 02/2011, stable 08/2011    Patient Active Problem List   Diagnosis Date Noted  . Odynophagia 12/08/2017  . Syncope and collapse 12/08/2017  . Hyperlipidemia 12/08/2017  . OSA (obstructive sleep apnea) 12/08/2017  . Hypertension 12/08/2017  . Dysarthria 12/08/2017  . [name] (coronary artery disease) 12/08/2017  . Chronic systolic heart failure ([place]) 12/08/2017  . Chronic atrial fibrillation  ([place]) 12/08/2017  . Syndrome of inappropriate ADH (SIADH) secretion ([place]) 12/08/2017  . Acute systolic congestive heart failure ([place])   . Atrial fibrillation, chronic ([place])   . SIADH (syndrome of inappropriate ADH production) ([place])   . Subclinical hyperthyroidism   . Acute on chronic systolic heart failure ([place]) 11/20/2017  . Acute exacerbation of CHF (congestive heart failure) ([place]) 11/18/2017  . Hyponatremia with excess extracellular fluid volume 11/18/2017  . Hyperglycemia 11/18/2017  . Supratherapeutic INR 11/18/2017  . Mitral valve prolapse   . Permanent atrial fibrillation ([place]) 11/08/2013  . Benign essential HTN 11/08/2013  . Mitral regurgitation 11/08/2013    Past Surgical History:  Procedure Laterality Date  . APPENDECTOMY  1950  . LUMBAR DISC SURGERY  1959   ruptured disc repair  . RIGHT/LEFT HEART CATH AND CORONARY ANGIOGRAPHY N/A 11/21/2017   Procedure: RIGHT/LEFT HEART CATH AND CORONARY ANGIOGRAPHY;  Surgeon: Sherren Mocha, MD;  Location: [facility];  Service: Cardiovascular;  Laterality: N/A;  . SKIN CANCER EXCISION Left    "side of my nose"       Home Medications    Prior to Admission medications   Medication Sig Start Date End Date Taking? Authorizing Provider  acetaminophen (TYLENOL) 500 MG tablet Take 1,000 mg by mouth every 6 (six) hours as needed for mild pain.    Yes [provider]  atorvastatin (LIPITOR) 20 MG tablet Take 1  tablet (20 mg total) by mouth daily at 6 PM. 11/27/17  Yes Allie Bossier, MD  carbamide peroxide (DEBROX) 6.5 % OTIC solution 10 drops as needed (ear wax).   Yes [provider]  digoxin (LANOXIN) 0.125 MG tablet Take 1 tablet (0.125 mg total) by mouth daily. 11/28/17  Yes Allie Bossier, MD  finasteride (PROSCAR) 5 MG tablet Take 5 mg by mouth daily.  09/06/13  Yes [provider]  fluticasone (FLONASE) 50 MCG/ACT nasal spray Place 1 spray into both nostrils daily.   Yes [provider]    furosemide (LASIX) 20 MG tablet Take 1 tablet (20 mg total) by mouth daily. 11/28/17  Yes Allie Bossier, MD  lactulose (CHRONULAC) 10 GM/15ML solution Take 15 mLs (10 g total) by mouth 2 (two) times daily as needed for mild constipation. 11/27/17  Yes Allie Bossier, MD  loratadine (ALAVERT) 10 MG tablet Take 10 mg by mouth daily.   Yes [provider]  metoprolol succinate (TOPROL-XL) 25 MG 24 hr tablet Take 3 tablets (75 mg total) by mouth daily. 11/28/17  Yes Allie Bossier, MD  Multiple Vitamin (MULTIVITAMIN) capsule Take by mouth.   Yes [provider]  warfarin (COUMADIN) 1 MG tablet Take 1 mg by mouth daily. On schedule of 1mg  alternating with 2mg  per clinic instructions   Yes [provider]  warfarin (COUMADIN) 2 MG tablet Take 2 mg by mouth See admin instructions. Alternating with 1mg  tablet as instructed by clinic   Yes [provider]  warfarin (COUMADIN) 3 MG tablet Take 0.5 tablets (1.5 mg total) by mouth daily at 6 PM. Ensure follow-up to Coumadin clinic on Monday for INR check Patient not taking: Reported on 12/08/2017 11/27/17   Allie Bossier, MD    Family History Family History  Problem Relation Age of Onset  . Heart failure Mother   . Heart attack Father   . [name] Father   . Heart attack Brother   . [name] Brother   . Colon cancer Brother   . Colon cancer Sister     Social History Social History   Tobacco Use  . Smoking status: Former Smoker    Years: 3.00    Types: Cigarettes    Last attempt to quit: 1958    Years since quitting: 61.1  . Smokeless tobacco: Never Used  Substance Use Topics  . Alcohol use: No  . Drug use: No     Allergies   Novocain [procaine]   Review of Systems Review of Systems  All other systems reviewed and are negative.    Physical Exam Updated Vital Signs BP (!) 137/91 (BP Location: Left Arm)   Pulse (!) 58   Temp 97.9 F (36.6 C)   Resp 16   Ht 5\' 10"  (1.778 m)   Wt 79.4 kg (175 lb)    SpO2 100%   BMI 25.11 kg/m   Physical Exam  Constitutional: He is oriented to person, place, and time. He appears well-developed and well-nourished.  HENT:  Head: Normocephalic and atraumatic.  Cardiovascular:  No murmur heard. Tachycardic and irregular  Pulmonary/Chest: Effort normal and breath sounds normal. No respiratory distress.  Abdominal: Soft. There is no tenderness. There is no rebound and no guarding.  Musculoskeletal: He exhibits no edema or tenderness.  Neurological: He is alert and oriented to person, place, and time.  Moderate expressive aphasia.  There is mild right-sided facial weakness and right upper extremity weakness.  Sensation to  light touch intact in all 4 extremities.  Slight pronator drift on the right.  Visual fields are grossly intact.  Skin: Skin is warm and dry.  Psychiatric: He has a normal mood and affect. His behavior is normal.  Nursing note and vitals reviewed.    ED Treatments / Results  Labs (all labs ordered are listed, but only abnormal results are displayed) Labs Reviewed  PROTIME-INR - Abnormal; Notable for the following components:      Result Value   Prothrombin Time 20.2 (*)    All other components within normal limits  COMPREHENSIVE METABOLIC PANEL - Abnormal; Notable for the following components:   Sodium 133 (*)    Chloride 95 (*)    Glucose, Bld 126 (*)    BUN 31 (*)    Calcium 8.7 (*)    Albumin 3.0 (*)    GFR calc non Af Amer 59 (*)    All other components within normal limits  CBG MONITORING, ED - Abnormal; Notable for the following components:   Glucose-Capillary 116 (*)    All other components within normal limits  I-STAT CHEM 8, ED - Abnormal; Notable for the following components:   Sodium 133 (*)    Chloride 95 (*)    BUN 35 (*)    Glucose, Bld 122 (*)    Calcium, Ion 1.05 (*)    All other components within normal limits  ETHANOL  APTT  CBC  DIFFERENTIAL  RAPID URINE DRUG SCREEN, HOSP PERFORMED  URINALYSIS,  ROUTINE W REFLEX MICROSCOPIC  VITAMIN B12  FOLATE  IRON AND TIBC  FERRITIN  RETICULOCYTES  I-STAT TROPONIN, ED    EKG  EKG Interpretation None       Radiology Ct Angio Head W Or Wo Contrast  Result Date: 12/08/2017 CLINICAL DATA:  Expressive aphasia and right-sided weakness. EXAM: CT ANGIOGRAPHY HEAD AND NECK TECHNIQUE: Multidetector CT imaging of the head and neck was performed using the standard protocol during bolus administration of intravenous contrast. Multiplanar CT image reconstructions and MIPs were obtained to evaluate the vascular anatomy. Carotid stenosis measurements (when applicable) are obtained utilizing NASCET criteria, using the distal internal carotid diameter as the denominator. CONTRAST:  19mL ISOVUE-370 IOPAMIDOL (ISOVUE-370) INJECTION 76% COMPARISON:  Head CT today. Chest radiograph 11/18/2017. No prior angiographic imaging. FINDINGS: CTA NECK FINDINGS Aortic arch: Standard 3 vessel aortic arch with moderate volume calcified and soft plaque. Patent brachiocephalic and subclavian arteries with nonstenotic atherosclerotic plaque bilaterally. Right carotid system: Patent without evidence of stenosis or dissection. Left carotid system: Patent without evidence of stenosis or dissection. Mild calcified and soft plaque at the carotid bifurcation. Vertebral arteries: The vertebral arteries are patent with the left being slightly dominant. Predominantly calcified plaque at both vertebral artery origins results in likely mild stenosis on the left and at most minimal stenosis on the right. Skeleton: Moderate cervical disc degeneration. Other neck: No mass or lymph node enlargement. Upper chest: Biapical pleural-parenchymal scarring. Subpleural reticulation and peripheral ground-glass opacity in both upper lobes with widespread interstitial density throughout the lungs on the prior chest radiograph. Review of the MIP images confirms the above findings CTA HEAD FINDINGS Anterior  circulation: The internal carotid arteries are patent from skull base to carotid termini with mild calcified siphon atherosclerosis resulting in at most mild paraclinoid ICA stenosis bilaterally. ACAs and MCAs are patent without evidence of proximal branch occlusion or significant proximal stenosis. No aneurysm. Posterior circulation: The intracranial vertebral arteries are patent with left greater than right V4  segment atherosclerosis resulting in luminal irregularity but no significant stenosis. Patent PICA and SCA origins are identified bilaterally. The basilar artery is widely patent. Posterior communicating arteries are diminutive or absent. PCAs are patent without evidence of significant proximal stenosis. No aneurysm. Venous sinuses: As permitted by contrast timing, patent. Anatomic variants: None. Delayed phase: Not performed. Review of the MIP images confirms the above findings IMPRESSION: 1. No emergent large vessel occlusion. 2. Mild intracranial atherosclerosis with mild bilateral ICA narrowing. 3. Widely patent cervical carotid arteries. 4. Mild left vertebral artery origin stenosis. 5.  Aortic Atherosclerosis (ICD10-I70.0). 6. Biapical parenchymal lung changes concerning for interstitial lung disease. Preliminary findings of no large vessel occlusion were communicated to Dr. Lorraine Lax at 1:29 pm on 12/08/2017 by text page via the [facility] messaging system. Electronically Signed   By: Logan Bores M.D.   On: 12/08/2017 13:44   Ct Angio Neck W Or Wo Contrast  Result Date: 12/08/2017 CLINICAL DATA:  Expressive aphasia and right-sided weakness. EXAM: CT ANGIOGRAPHY HEAD AND NECK TECHNIQUE: Multidetector CT imaging of the head and neck was performed using the standard protocol during bolus administration of intravenous contrast. Multiplanar CT image reconstructions and MIPs were obtained to evaluate the vascular anatomy. Carotid stenosis measurements (when applicable) are obtained utilizing NASCET criteria,  using the distal internal carotid diameter as the denominator. CONTRAST:  49mL ISOVUE-370 IOPAMIDOL (ISOVUE-370) INJECTION 76% COMPARISON:  Head CT today. Chest radiograph 11/18/2017. No prior angiographic imaging. FINDINGS: CTA NECK FINDINGS Aortic arch: Standard 3 vessel aortic arch with moderate volume calcified and soft plaque. Patent brachiocephalic and subclavian arteries with nonstenotic atherosclerotic plaque bilaterally. Right carotid system: Patent without evidence of stenosis or dissection. Left carotid system: Patent without evidence of stenosis or dissection. Mild calcified and soft plaque at the carotid bifurcation. Vertebral arteries: The vertebral arteries are patent with the left being slightly dominant. Predominantly calcified plaque at both vertebral artery origins results in likely mild stenosis on the left and at most minimal stenosis on the right. Skeleton: Moderate cervical disc degeneration. Other neck: No mass or lymph node enlargement. Upper chest: Biapical pleural-parenchymal scarring. Subpleural reticulation and peripheral ground-glass opacity in both upper lobes with widespread interstitial density throughout the lungs on the prior chest radiograph. Review of the MIP images confirms the above findings CTA HEAD FINDINGS Anterior circulation: The internal carotid arteries are patent from skull base to carotid termini with mild calcified siphon atherosclerosis resulting in at most mild paraclinoid ICA stenosis bilaterally. ACAs and MCAs are patent without evidence of proximal branch occlusion or significant proximal stenosis. No aneurysm. Posterior circulation: The intracranial vertebral arteries are patent with left greater than right V4 segment atherosclerosis resulting in luminal irregularity but no significant stenosis. Patent PICA and SCA origins are identified bilaterally. The basilar artery is widely patent. Posterior communicating arteries are diminutive or absent. PCAs are patent  without evidence of significant proximal stenosis. No aneurysm. Venous sinuses: As permitted by contrast timing, patent. Anatomic variants: None. Delayed phase: Not performed. Review of the MIP images confirms the above findings IMPRESSION: 1. No emergent large vessel occlusion. 2. Mild intracranial atherosclerosis with mild bilateral ICA narrowing. 3. Widely patent cervical carotid arteries. 4. Mild left vertebral artery origin stenosis. 5.  Aortic Atherosclerosis (ICD10-I70.0). 6. Biapical parenchymal lung changes concerning for interstitial lung disease. Preliminary findings of no large vessel occlusion were communicated to Dr. Lorraine Lax at 1:29 pm on 12/08/2017 by text page via the [facility] messaging system. Electronically Signed   By: Seymour Bars.D.  On: 12/08/2017 13:44   Ct Head Code Stroke Wo Contrast  Result Date: 12/08/2017 CLINICAL DATA:  Code stroke. 84 year old male with expressive aphasia and right side weakness. Initial encounter. EXAM: CT HEAD WITHOUT CONTRAST TECHNIQUE: Contiguous axial images were obtained from the base of the skull through the vertex without intravenous contrast. COMPARISON:  None. FINDINGS: Brain: Bilateral low-density subdural collections, each measuring 5-6 millimeters in thickness. See coronal image 26. No hyperdense intracranial hemorrhage identified. No midline shift. Basilar cisterns remain normal. Normal for age bilateral gray-white matter differentiation. No cortically based acute infarct identified. No cortical encephalomalacia identified. No ventriculomegaly. Vascular: Calcified atherosclerosis at the skull base. No suspicious intracranial vascular hyperdensity. Skull: Intact.  No acute osseous abnormality identified. Sinuses/Orbits: Clear. Other: Visualized orbit soft tissues are within normal limits. Visualized scalp soft tissues are within normal limits. ASPECTS [facility] Stroke Program Early CT Score) - Ganglionic level infarction (caudate, lentiform nuclei, internal  capsule, insula, M1-M3 cortex): 7 - Supraganglionic infarction (M4-M6 cortex): 3 Total score (0-10 with 10 being normal): 10 IMPRESSION: 1. Positive for bilateral subdural hygromas or very low-density subdural hematomas, each 5-6 mm in thickness. No midline shift. Patent basilar cisterns. 2. No superimposed acute cortically based infarct or hyperdense intracranial hemorrhage identified. 3. ASPECTS is 10. 4. These results were communicated to Dr. Lorraine Lax at 1:06 pmon 1/28/2019by text page via the [facility] messaging system. Electronically Signed   By: Genevie Ann M.D.   On: 12/08/2017 13:06    Procedures Procedures (including critical care time)  Medications Ordered in ED Medications   stroke: mapping our early stages of recovery book (not administered)  acetaminophen (TYLENOL) tablet 650 mg (not administered)    Or  acetaminophen (TYLENOL) solution 650 mg (not administered)    Or  acetaminophen (TYLENOL) suppository 650 mg (not administered)  digoxin (LANOXIN) 0.25 MG/ML injection 0.125 mg (0.125 mg Intravenous Given 12/08/17 1620)  pantoprazole (PROTONIX) injection 40 mg (40 mg Intravenous Given 12/08/17 1619)  iopamidol (ISOVUE-370) 76 % injection (50 mLs  Contrast Given 12/08/17 1303)     Initial Impression / Assessment and Plan / ED Course  I have reviewed the triage vital signs and the nursing notes.  Pertinent labs & imaging results that were available during my care of the patient were reviewed by me and considered in my medical decision making (see chart for details).     Patient with history of atrial fibrillation on Coumadin here for change in mental status.  In the emergency department he has mild right upper extremity and right facial weakness as well as expressive aphasia.  Code stroke Activated on ED assessment.  He was evaluated by neurology in the emergency department and thought not to be a TPA candidate given his elevated INR and chronic appearing subdurals on CT head.  Medicine  consulted for admission for further workup and treatment.  Final Clinical Impressions(s) / ED Diagnoses   Final diagnoses:  Acute CVA (cerebrovascular accident) [facility])    ED Discharge Orders    None       Quintella Reichert, MD 12/08/17 1623

## 2017-12-08 NOTE — ED Notes (Signed)
Meal tray ordered 

## 2017-12-08 NOTE — H&P (Signed)
History and Physical    Ryan Savage TDD:[id number] DOB: 1929/02/15 DOA: 12/08/2017  **Will place the patient on observation status based on the expectation that the patient will need hospitalization/ hospital care or </= 24 hours.  Can be changed to inpatient if formal stroke diagnosis determined or if additional care/evaluation required that cannot be accomplished in the outpatient setting  PCP: Lajean Manes, MD   Attending physician: Lorin Mercy  Patient coming from/Resides with: Private residence/wife  Chief Complaint: Dysarthria, recent dizziness with falls x2 in the past week  HPI: Ryan Savage is a 84 y.o. male with medical history significant for SIADH, sleep apnea not on CPAP, mitral valve prolapse, hypertension, dyslipidemia, A. fib on Coumadin and thrombocytopenia.  Patient has also been recently diagnosed with two-vessel severe [name] not amenable to PTCA and new systolic heart failure.  Patient was discharged home with home health 1/17.  He presented because of persistent coughing that was later determined to be related to ischemic cardiomyopathy and new onset heart failure.  He was diuresed with Lasix with discharge weight 177 pounds with a total of 1.6 L removed.  It was recommended at time of discharge that he take Lasix 20 mg prn weight gain.  At time of admission on 1/8 admitting provider documented patient had been experiencing several weeks of insomnia with associated poor oral intake and anorexia as well as dyspnea on exertion and significant nonproductive cough.  Patient was discharged on Lopressor 75 mg daily as well as Lasix 20 mg daily.  His benazepril and diltiazem were stopped as well as his Zocor and were replaced by the above-stated beta-blocker and diuretic.  He was also started on 20 mg of Lipitor and digoxin 0.125 mg daily.  His chronic hyponatremia in the context of SIADH was worse during the previous hospitalization and felt related to volume overload and heart  failure exacerbation.  Family reports that since discharge patient has maintained a 1200 cc daily fluid restriction and has been taking his Lasix daily.  He has had repeated issues with difficulty swallowing and feeling like food and drink are sticking in his throat.  He repeatedly coughs and is unable to expectorate secretions consistently and when he does the secretions are reported as thick and yellow.  Over the past week and a half he has been more dizzy and weak with ambulation.  He had a dizzy spell last Wednesday and fell.  He had an unwitnessed fall in the bathroom this past Friday and was complaining of left-sided rib cage pain.  Today at breakfast his wife noticed that he was having nonsensical speech and unable to find the correct words during speaking.  Son at the bedside thinks he may have had some mild speech abnormalities yesterday evening when he saw him around suppertime.  Patient has subsequently been brought to the ER where dysarthria persists and he was noted to have subtle right upper extremity drift.  A code stroke was initiated.  Stroke team at bedside with an NIHSS of 5.  Stat CT showed bilateral subdural hygromas or very low density subdural hematomas noting patient on Coumadin prior to arrival.  Because of this TPA was contraindicated therefore no acute stroke treatment indicated.  Neurologist ordered CTA head and neck.  Patient will be admitted to rule out stroke versus TIA.  ED Course:  Vital Signs: BP (!) 137/91 (BP Location: Left Arm)   Pulse (!) 58   Temp 97.6 F (36.4 C) (Oral)   Resp 16  Ht 5\' 10"  (1.778 m)   Wt 79.4 kg (175 lb)   SpO2 100%   BMI 25.11 kg/m  CT head noncontrasted: As above CTA head and neck: No emergent large vessel occlusion.  Widely patent cervical carotid arteries. Lab data: Sodium 133, potassium 4.1, chloride 95, CO2 27, glucose 126, BUN 31, creatinine 1.09, anion gap 11, LFTs not elevated, poc troponin 0.05, white count 8900 with normal  differential, hemoglobin 13.5, platelets 190,000, PT 20.2, INR 1.74, PTT 30, urinalysis unremarkable. Medications and treatments: None  Review of Systems:  In addition to the HPI above,  No Fever-chills, myalgias or other constitutional symptoms No Headache, changes with Vision or hearing, new weakness, tingling, numbness in any extremity, tremors or seizure activity No problems swallowing food or Liquids, indigestion/reflux, choking or coughing while eating, abdominal pain with or after eating No Chest pain, Shortness of Breath, palpitations, orthopnea or DOE No Abdominal pain, N/V, melena,hematochezia, dark tarry stools, constipation No dysuria, malodorous urine, hematuria or flank pain No new skin rashes, lesions, masses or bruises, No new joint pains, aches, swelling or redness No recent unintentional weight gain or loss No polyuria, polydypsia or polyphagia   Past Medical History:  Diagnosis Date  . Atrial fibrillation, chronic (HCC)    Chronic..coumadin clinic -Dr Radford Pax  . BPH (benign prostatic hypertrophy)   . CHF (congestive heart failure) ([place])   . [name] (degenerative joint disease)   . Hyperlipidemia    LDL goal less then 100  . Hypertension   . Microscopic hematuria    [name]  . Mitral valve prolapse    with moderate MR  . OSA (obstructive sleep apnea)    intolerant with CPAP (11/19/2017)  . SIADH (syndrome of inappropriate ADH production) ([place])   . Skin cancer    "side of my nose" (11/19/2017)  . Thrombocytopenia (HCC)    Mild- platelet count 143,000 on 02/2011, stable 08/2011    Past Surgical History:  Procedure Laterality Date  . APPENDECTOMY  1950  . LUMBAR DISC SURGERY  1959   ruptured disc repair  . RIGHT/LEFT HEART CATH AND CORONARY ANGIOGRAPHY N/A 11/21/2017   Procedure: RIGHT/LEFT HEART CATH AND CORONARY ANGIOGRAPHY;  Surgeon: Sherren Mocha, MD;  Location: [facility];  Service: Cardiovascular;  Laterality: N/A;  . SKIN CANCER EXCISION Left      "side of my nose"    Social History   Socioeconomic History  . Marital status: Married    Spouse name: Not on file  . Number of children: Not on file  . Years of education: Not on file  . Highest education level: Not on file  Social Needs  . Financial resource strain: Not on file  . Food insecurity - worry: Not on file  . Food insecurity - inability: Not on file  . Transportation needs - medical: Not on file  . Transportation needs - non-medical: Not on file  Occupational History  . Occupation: retired  Tobacco Use  . Smoking status: Former Smoker    Years: 3.00    Types: Cigarettes    Last attempt to quit: 1958    Years since quitting: 61.1  . Smokeless tobacco: Never Used  Substance and Sexual Activity  . Alcohol use: No  . Drug use: No  . Sexual activity: Not on file  Other Topics Concern  . Not on file  Social History Narrative  . Not on file    Mobility: Has started using a rolling walker since recent falls Work  history: Not obtained   Allergies  Allergen Reactions  . Novocain [Procaine] Rash    Family History  Problem Relation Age of Onset  . Heart failure Mother   . Heart attack Father   . [name] Father   . Heart attack Brother   . [name] Brother   . Colon cancer Brother   . Colon cancer Sister      Prior to Admission medications   Medication Sig Start Date End Date Taking? Authorizing Provider  acetaminophen (TYLENOL) 325 MG tablet Take 650 mg by mouth every 6 (six) hours as needed for mild pain.    [provider]  atorvastatin (LIPITOR) 20 MG tablet Take 1 tablet (20 mg total) by mouth daily at 6 PM. 11/27/17   Allie Bossier, MD  digoxin (LANOXIN) 0.125 MG tablet Take 1 tablet (0.125 mg total) by mouth daily. 11/28/17   Allie Bossier, MD  finasteride (PROSCAR) 5 MG tablet Take 5 mg by mouth daily.  09/06/13   [provider]  furosemide (LASIX) 20 MG tablet Take 1 tablet (20 mg total) by mouth daily. 11/28/17   Allie Bossier,  MD  lactulose (CHRONULAC) 10 GM/15ML solution Take 15 mLs (10 g total) by mouth 2 (two) times daily as needed for mild constipation. 11/27/17   Allie Bossier, MD  metoprolol succinate (TOPROL-XL) 25 MG 24 hr tablet Take 3 tablets (75 mg total) by mouth daily. 11/28/17   Allie Bossier, MD  Multiple Vitamin (MULTIVITAMIN) capsule Take by mouth.    [provider]  warfarin (COUMADIN) 3 MG tablet Take 0.5 tablets (1.5 mg total) by mouth daily at 6 PM. Ensure follow-up to Coumadin clinic on Monday for INR check 11/27/17   Allie Bossier, MD    Physical Exam: Vitals:   12/08/17 1205 12/08/17 1213  BP:  (!) 137/91  Pulse:  (!) 58  Resp:  16  Temp:  97.6 F (36.4 C)  TempSrc:  Oral  SpO2:  100%  Weight: 79.4 kg (175 lb)   Height: 5\' 10"  (1.778 m)       Constitutional: NAD, calm, uncomfortable 2/2 ongoing left chest wall/rib cage pain Eyes: PERRL, lids and conjunctivae normal ENMT: Mucous membranes are dry. Posterior pharynx clear of any exudate or lesions.tongue reddened. Neck: normal, supple, no masses, no thyromegaly Respiratory: clear to auscultation bilaterally, no wheezing, no crackles. Normal respiratory effort. No accessory muscle use.  Left lateral anterior rib cage and around the fifth intercostal space tender to palpation. Cardiovascular: Regular rate and rhythm, no murmurs / rubs / gallops. No extremity edema. 2+ pedal pulses. No carotid bruits.  Abdomen: no tenderness, no masses palpated. No hepatosplenomegaly. Bowel sounds positive.  Musculoskeletal: no clubbing / cyanosis. No joint deformity upper and lower extremities. Good ROM, no contractures. Normal muscle tone.  Skin: no rashes, lesions, ulcers. No induration Neurologic: CN 2-12 grossly intact. Sensation intact, DTR normal. Strength 5/5 x all 4 extremities.  Subtle right arm drift. Psychiatric: Unable to accurately assess judgment and insight given patient's notable dysarthria.  Alert and oriented x name.  Normal mood.  Patient was able to follow commands somewhat easily although was unable to perform heel to shin maneuver despite repeated attempts at demonstration.   Labs on Admission: I have personally reviewed following labs and imaging studies  CBC: Recent Labs  Lab 12/08/17 1230 12/08/17 1253  WBC 8.9  --   NEUTROABS 5.7  --   HGB 13.5 13.9  HCT 40.3 41.0  MCV 93.9  --   PLT 190  --    Basic Metabolic Panel: Recent Labs  Lab 12/08/17 1230 12/08/17 1253  NA 133* 133*  K 4.1 4.2  CL 95* 95*  CO2 27  --   GLUCOSE 126* 122*  BUN 31* 35*  CREATININE 1.09 1.00  CALCIUM 8.7*  --    GFR: Estimated Creatinine Clearance: 54.8 mL/min (by C-G formula based on SCr of 1 mg/dL). Liver Function Tests: Recent Labs  Lab 12/08/17 1230  AST 22  ALT 17  ALKPHOS 53  BILITOT 1.0  PROT 6.9  ALBUMIN 3.0*   No results for input(s): LIPASE, AMYLASE in the last 168 hours. No results for input(s): AMMONIA in the last 168 hours. Coagulation Profile: Recent Labs  Lab 12/08/17 1230  INR 1.74   Cardiac Enzymes: No results for input(s): CKTOTAL, CKMB, CKMBINDEX, TROPONINI in the last 168 hours. BNP (last 3 results) No results for input(s): PROBNP in the last 8760 hours. HbA1C: No results for input(s): HGBA1C in the last 72 hours. CBG: Recent Labs  Lab 12/08/17 1236  GLUCAP 116*   Lipid Profile: No results for input(s): CHOL, HDL, LDLCALC, TRIG, CHOLHDL, LDLDIRECT in the last 72 hours. Thyroid Function Tests: No results for input(s): TSH, T4TOTAL, FREET4, T3FREE, THYROIDAB in the last 72 hours. Anemia Panel: No results for input(s): VITAMINB12, FOLATE, FERRITIN, TIBC, IRON, RETICCTPCT in the last 72 hours. Urine analysis:    Component Value Date/Time   COLORURINE YELLOW 12/08/2017 [place] 12/08/2017 1327   LABSPEC 1.015 12/08/2017 1327   PHURINE 8.0 12/08/2017 1327   GLUCOSEU NEGATIVE 12/08/2017 1327   HGBUR NEGATIVE 12/08/2017 1327   BILIRUBINUR  NEGATIVE 12/08/2017 1327   KETONESUR NEGATIVE 12/08/2017 1327   PROTEINUR NEGATIVE 12/08/2017 1327   NITRITE NEGATIVE 12/08/2017 1327   LEUKOCYTESUR NEGATIVE 12/08/2017 1327   Sepsis Labs: @LABRCNTIP (procalcitonin:4,lacticidven:4) )No results found for this or any previous visit (from the past 240 hour(s)).   Radiological Exams on Admission: Ct Angio Head W Or Wo Contrast  Result Date: 12/08/2017 CLINICAL DATA:  Expressive aphasia and right-sided weakness. EXAM: CT ANGIOGRAPHY HEAD AND NECK TECHNIQUE: Multidetector CT imaging of the head and neck was performed using the standard protocol during bolus administration of intravenous contrast. Multiplanar CT image reconstructions and MIPs were obtained to evaluate the vascular anatomy. Carotid stenosis measurements (when applicable) are obtained utilizing NASCET criteria, using the distal internal carotid diameter as the denominator. CONTRAST:  76mL ISOVUE-370 IOPAMIDOL (ISOVUE-370) INJECTION 76% COMPARISON:  Head CT today. Chest radiograph 11/18/2017. No prior angiographic imaging. FINDINGS: CTA NECK FINDINGS Aortic arch: Standard 3 vessel aortic arch with moderate volume calcified and soft plaque. Patent brachiocephalic and subclavian arteries with nonstenotic atherosclerotic plaque bilaterally. Right carotid system: Patent without evidence of stenosis or dissection. Left carotid system: Patent without evidence of stenosis or dissection. Mild calcified and soft plaque at the carotid bifurcation. Vertebral arteries: The vertebral arteries are patent with the left being slightly dominant. Predominantly calcified plaque at both vertebral artery origins results in likely mild stenosis on the left and at most minimal stenosis on the right. Skeleton: Moderate cervical disc degeneration. Other neck: No mass or lymph node enlargement. Upper chest: Biapical pleural-parenchymal scarring. Subpleural reticulation and peripheral ground-glass opacity in both upper  lobes with widespread interstitial density throughout the lungs on the prior chest radiograph. Review of the MIP images confirms the above findings CTA HEAD FINDINGS Anterior circulation: The internal carotid arteries are patent from skull base to  carotid termini with mild calcified siphon atherosclerosis resulting in at most mild paraclinoid ICA stenosis bilaterally. ACAs and MCAs are patent without evidence of proximal branch occlusion or significant proximal stenosis. No aneurysm. Posterior circulation: The intracranial vertebral arteries are patent with left greater than right V4 segment atherosclerosis resulting in luminal irregularity but no significant stenosis. Patent PICA and SCA origins are identified bilaterally. The basilar artery is widely patent. Posterior communicating arteries are diminutive or absent. PCAs are patent without evidence of significant proximal stenosis. No aneurysm. Venous sinuses: As permitted by contrast timing, patent. Anatomic variants: None. Delayed phase: Not performed. Review of the MIP images confirms the above findings IMPRESSION: 1. No emergent large vessel occlusion. 2. Mild intracranial atherosclerosis with mild bilateral ICA narrowing. 3. Widely patent cervical carotid arteries. 4. Mild left vertebral artery origin stenosis. 5.  Aortic Atherosclerosis (ICD10-I70.0). 6. Biapical parenchymal lung changes concerning for interstitial lung disease. Preliminary findings of no large vessel occlusion were communicated to Dr. Lorraine Lax at 1:29 pm on 12/08/2017 by text page via the [facility] messaging system. Electronically Signed   By: Logan Bores M.D.   On: 12/08/2017 13:44   Ct Angio Neck W Or Wo Contrast  Result Date: 12/08/2017 CLINICAL DATA:  Expressive aphasia and right-sided weakness. EXAM: CT ANGIOGRAPHY HEAD AND NECK TECHNIQUE: Multidetector CT imaging of the head and neck was performed using the standard protocol during bolus administration of intravenous contrast.  Multiplanar CT image reconstructions and MIPs were obtained to evaluate the vascular anatomy. Carotid stenosis measurements (when applicable) are obtained utilizing NASCET criteria, using the distal internal carotid diameter as the denominator. CONTRAST:  98mL ISOVUE-370 IOPAMIDOL (ISOVUE-370) INJECTION 76% COMPARISON:  Head CT today. Chest radiograph 11/18/2017. No prior angiographic imaging. FINDINGS: CTA NECK FINDINGS Aortic arch: Standard 3 vessel aortic arch with moderate volume calcified and soft plaque. Patent brachiocephalic and subclavian arteries with nonstenotic atherosclerotic plaque bilaterally. Right carotid system: Patent without evidence of stenosis or dissection. Left carotid system: Patent without evidence of stenosis or dissection. Mild calcified and soft plaque at the carotid bifurcation. Vertebral arteries: The vertebral arteries are patent with the left being slightly dominant. Predominantly calcified plaque at both vertebral artery origins results in likely mild stenosis on the left and at most minimal stenosis on the right. Skeleton: Moderate cervical disc degeneration. Other neck: No mass or lymph node enlargement. Upper chest: Biapical pleural-parenchymal scarring. Subpleural reticulation and peripheral ground-glass opacity in both upper lobes with widespread interstitial density throughout the lungs on the prior chest radiograph. Review of the MIP images confirms the above findings CTA HEAD FINDINGS Anterior circulation: The internal carotid arteries are patent from skull base to carotid termini with mild calcified siphon atherosclerosis resulting in at most mild paraclinoid ICA stenosis bilaterally. ACAs and MCAs are patent without evidence of proximal branch occlusion or significant proximal stenosis. No aneurysm. Posterior circulation: The intracranial vertebral arteries are patent with left greater than right V4 segment atherosclerosis resulting in luminal irregularity but no  significant stenosis. Patent PICA and SCA origins are identified bilaterally. The basilar artery is widely patent. Posterior communicating arteries are diminutive or absent. PCAs are patent without evidence of significant proximal stenosis. No aneurysm. Venous sinuses: As permitted by contrast timing, patent. Anatomic variants: None. Delayed phase: Not performed. Review of the MIP images confirms the above findings IMPRESSION: 1. No emergent large vessel occlusion. 2. Mild intracranial atherosclerosis with mild bilateral ICA narrowing. 3. Widely patent cervical carotid arteries. 4. Mild left vertebral artery origin stenosis. 5.  Aortic Atherosclerosis (ICD10-I70.0). 6. Biapical parenchymal lung changes concerning for interstitial lung disease. Preliminary findings of no large vessel occlusion were communicated to Dr. Lorraine Lax at 1:29 pm on 12/08/2017 by text page via the [facility] messaging system. Electronically Signed   By: Logan Bores M.D.   On: 12/08/2017 13:44   Ct Head Code Stroke Wo Contrast  Result Date: 12/08/2017 CLINICAL DATA:  Code stroke. 84 year old male with expressive aphasia and right side weakness. Initial encounter. EXAM: CT HEAD WITHOUT CONTRAST TECHNIQUE: Contiguous axial images were obtained from the base of the skull through the vertex without intravenous contrast. COMPARISON:  None. FINDINGS: Brain: Bilateral low-density subdural collections, each measuring 5-6 millimeters in thickness. See coronal image 26. No hyperdense intracranial hemorrhage identified. No midline shift. Basilar cisterns remain normal. Normal for age bilateral gray-white matter differentiation. No cortically based acute infarct identified. No cortical encephalomalacia identified. No ventriculomegaly. Vascular: Calcified atherosclerosis at the skull base. No suspicious intracranial vascular hyperdensity. Skull: Intact.  No acute osseous abnormality identified. Sinuses/Orbits: Clear. Other: Visualized orbit soft tissues are  within normal limits. Visualized scalp soft tissues are within normal limits. ASPECTS [facility] Stroke Program Early CT Score) - Ganglionic level infarction (caudate, lentiform nuclei, internal capsule, insula, M1-M3 cortex): 7 - Supraganglionic infarction (M4-M6 cortex): 3 Total score (0-10 with 10 being normal): 10 IMPRESSION: 1. Positive for bilateral subdural hygromas or very low-density subdural hematomas, each 5-6 mm in thickness. No midline shift. Patent basilar cisterns. 2. No superimposed acute cortically based infarct or hyperdense intracranial hemorrhage identified. 3. ASPECTS is 10. 4. These results were communicated to Dr. Lorraine Lax at 1:06 pmon 1/28/2019by text page via the [facility] messaging system. Electronically Signed   By: Genevie Ann M.D.   On: 12/08/2017 13:06    EKG: (Independently reviewed) atrial fibrillation with ventricular rate 105 bpm, QTC 465 ms, normal R wave rotation, no acute ischemic changes and unchanged from previous EKG  Assessment/Plan Principal Problem:   Dysarthria -Patient presents with at a minimum several hours of dysarthric speech (possible onset yesterday evening around 6-7 PM according to son) with subtle right arm drift on exam -CT questions bilateral hygromas versus low density subdural hematoma -Appreciate neurology assistance-they request no anticoagulation until can clarify whether changes on CT consistent with subdural hematoma -CTA head/neck without any large vessel issues -Stat MR brain to better clarify if hygroma versus subdural hematoma -We will treat as stroke rule out versus TIA -Frequent neurological checks -Mobilize with assistance -NPO until able to pass swallowing evaluation -PT/OT/SLP evaluation -Given concerns over possible subdural will also hold antiplatelet initially -Lipid profile during previous admission: Cholesterol 74, HDL 27, LDL 35, triglycerides 58--of note was on atorvastatin 20 mg prior to admission-if stroke ruled in patient will  need to increase atorvastatin to 80 mg daily -HgbA1c -Completed echocardiogram on 1/9 with notable regional wall abnormalities (septal/apical akinesis inferior wall with hypokinesis), systolic dysfunction with EF 35% and moderate mitral regurgitation-no indication to repeat this admission -Underwent CTA head and neck so no indication to pursue carotid duplex  Active Problems:   Syncope and collapse -Patient and family report 2 falls at home last week (Wednesday/Friday) at least one fall preceded by dizziness and patient reporting issues with weakness, dizziness and requirement of rolling walker since discharge -Possibly related to orthostatic hypotension-check OVS -Hold beta-blocker until confirmed that blood pressure dropping noting patient was on a high dose of long-acting metoprolol prior to admission (75 mg) and with recent falls PCP instructed patient to decrease to 2 pills  or 50 mg daily -Likely will require low-flow IV fluids if to remain NPO or if OVS positive -Patient persistently complaining of left lateral rib cage pain after fall on Friday so will obtain chest x-ray with left rib detail to rule out fracture    Chronic atrial fibrillation  -On beta-blocker, digoxin and warfarin prior to admission -Holding warfarin in context of possible new subdural hematoma on CT -Current INR subtherapeutic -CHA2DS2-VASc Scoreis 5 -Continue digoxin but given IV route until stroke swallow screen confirms patient allowed p.o. intake    Odynophagia -Patient reports significant issues over the past several weeks of difficulty swallowing with a sensation of secretions and intake sticking in throat; also reported ulcers in mouth but better -Follow-up on SLP evaluation -May need esophagram -Differential includes dysmotility in the context of dehydration/dry mucous membranes after initiation of Lasix vs reflux/stricture -Add PPI for now -Ck anemia panel    Hypertension -Current blood pressure  suboptimal and dehydration suspected -Hold preadmission metoprolol and Lasix    [name] (coronary artery disease) -New diagnosis on previous admission and focuses on medical therapy since unable to pursue intervention -Was on beta-blocker and statin prior to admission    Chronic systolic heart failure  -New diagnosis previous admission with ejection fraction 35% (see echo above) -ACE inhibitor discontinued previous admission- ??  Hypotension -Also during previous admission CCB DC'd in favor of digoxin and beta-blocker -Low-dose Lasix given during hospitalization with optimal diuresis with cardiology recommending prn dosing but was discharged home on daily Lasix -Holding beta-blocker and diuretic as above    Hyperlipidemia -Holding statin as above secondary to NPO status    OSA (obstructive sleep apnea)/insomnia -Patient previously recommended for CPAP based on polysomnogram but due to nocturnal voiding and difficulty reapplying CPAP mask patient stopped utilizing -We will try smaller nasal mask and if tolerates he will need prescription for this at discharge    Syndrome of inappropriate ADH (SIADH) secretion  -Sodium at discharge was 129 with current sodium 133 -Continue to follow      DVT prophylaxis: SCDs Code Status: Full code-had lengthy discussion with wife and family-wife reluctant to make DNR until she can speak more fully with husband and given his current dysarthria he is unable to participate adequately in the conversation Family Communication: Wife and other family members at bedside Disposition Plan: Home pending PT/OT evaluation Consults called: Neurology/[place]    Samella Parr ANP-BC [facility] Pager [phone]   If 7PM-7AM, please contact night-coverage [email or web address] Password [facility]  12/08/2017, 2:49 PM

## 2017-12-08 NOTE — Progress Notes (Signed)
Report given to [employer].Patient is to be transferred to 3W24. Wife and daghter in law at bedside and are aware of patient's new room no. Darriel Utter, Wonda Cheng, Therapist, sports

## 2017-12-09 ENCOUNTER — Observation Stay (HOSPITAL_BASED_OUTPATIENT_CLINIC_OR_DEPARTMENT_OTHER): Payer: Medicare Other

## 2017-12-09 ENCOUNTER — Observation Stay (HOSPITAL_COMMUNITY): Payer: Medicare Other

## 2017-12-09 DIAGNOSIS — R55 Syncope and collapse: Secondary | ICD-10-CM | POA: Diagnosis present

## 2017-12-09 DIAGNOSIS — G960 Cerebrospinal fluid leak: Secondary | ICD-10-CM | POA: Diagnosis present

## 2017-12-09 DIAGNOSIS — E860 Dehydration: Secondary | ICD-10-CM | POA: Diagnosis present

## 2017-12-09 DIAGNOSIS — G4733 Obstructive sleep apnea (adult) (pediatric): Secondary | ICD-10-CM | POA: Diagnosis present

## 2017-12-09 DIAGNOSIS — R4701 Aphasia: Secondary | ICD-10-CM | POA: Diagnosis present

## 2017-12-09 DIAGNOSIS — Z85828 Personal history of other malignant neoplasm of skin: Secondary | ICD-10-CM | POA: Diagnosis not present

## 2017-12-09 DIAGNOSIS — I6340 Cerebral infarction due to embolism of unspecified cerebral artery: Secondary | ICD-10-CM

## 2017-12-09 DIAGNOSIS — Z87891 Personal history of nicotine dependence: Secondary | ICD-10-CM | POA: Diagnosis not present

## 2017-12-09 DIAGNOSIS — I63132 Cerebral infarction due to embolism of left carotid artery: Secondary | ICD-10-CM

## 2017-12-09 DIAGNOSIS — I482 Chronic atrial fibrillation: Secondary | ICD-10-CM | POA: Diagnosis present

## 2017-12-09 DIAGNOSIS — E222 Syndrome of inappropriate secretion of antidiuretic hormone: Secondary | ICD-10-CM | POA: Diagnosis present

## 2017-12-09 DIAGNOSIS — I5022 Chronic systolic (congestive) heart failure: Secondary | ICD-10-CM | POA: Diagnosis present

## 2017-12-09 DIAGNOSIS — R1310 Dysphagia, unspecified: Secondary | ICD-10-CM | POA: Diagnosis present

## 2017-12-09 DIAGNOSIS — R471 Dysarthria and anarthria: Secondary | ICD-10-CM | POA: Diagnosis not present

## 2017-12-09 DIAGNOSIS — I2510 Atherosclerotic heart disease of native coronary artery without angina pectoris: Secondary | ICD-10-CM | POA: Diagnosis present

## 2017-12-09 DIAGNOSIS — I959 Hypotension, unspecified: Secondary | ICD-10-CM | POA: Diagnosis present

## 2017-12-09 DIAGNOSIS — E039 Hypothyroidism, unspecified: Secondary | ICD-10-CM | POA: Diagnosis present

## 2017-12-09 DIAGNOSIS — G4700 Insomnia, unspecified: Secondary | ICD-10-CM | POA: Diagnosis present

## 2017-12-09 DIAGNOSIS — E785 Hyperlipidemia, unspecified: Secondary | ICD-10-CM | POA: Diagnosis present

## 2017-12-09 DIAGNOSIS — E440 Moderate protein-calorie malnutrition: Secondary | ICD-10-CM | POA: Diagnosis present

## 2017-12-09 DIAGNOSIS — G459 Transient cerebral ischemic attack, unspecified: Secondary | ICD-10-CM

## 2017-12-09 DIAGNOSIS — Z7901 Long term (current) use of anticoagulants: Secondary | ICD-10-CM | POA: Diagnosis not present

## 2017-12-09 DIAGNOSIS — Z6825 Body mass index (BMI) 25.0-25.9, adult: Secondary | ICD-10-CM | POA: Diagnosis not present

## 2017-12-09 DIAGNOSIS — I361 Nonrheumatic tricuspid (valve) insufficiency: Secondary | ICD-10-CM | POA: Diagnosis not present

## 2017-12-09 DIAGNOSIS — R29705 NIHSS score 5: Secondary | ICD-10-CM | POA: Diagnosis present

## 2017-12-09 DIAGNOSIS — I63512 Cerebral infarction due to unspecified occlusion or stenosis of left middle cerebral artery: Secondary | ICD-10-CM | POA: Diagnosis present

## 2017-12-09 DIAGNOSIS — I110 Hypertensive heart disease with heart failure: Secondary | ICD-10-CM | POA: Diagnosis present

## 2017-12-09 DIAGNOSIS — N400 Enlarged prostate without lower urinary tract symptoms: Secondary | ICD-10-CM | POA: Diagnosis present

## 2017-12-09 DIAGNOSIS — Z8249 Family history of ischemic heart disease and other diseases of the circulatory system: Secondary | ICD-10-CM | POA: Diagnosis not present

## 2017-12-09 LAB — LIPID PANEL
Cholesterol: 76 mg/dL (ref 0–200)
HDL: 25 mg/dL — ABNORMAL LOW (ref >40)
LDL Cholesterol: 36 mg/dL (ref 0–99)
Total CHOL/HDL Ratio: 3 RATIO
Triglycerides: 73 mg/dL (ref <150)
VLDL: 15 mg/dL (ref 0–40)

## 2017-12-09 LAB — PROTIME-INR
INR: 1.66
Prothrombin Time: 19.4 seconds — ABNORMAL HIGH (ref 11.4–15.2)

## 2017-12-09 LAB — HEMOGLOBIN A1C
Hgb A1c MFr Bld: 5.8 % — ABNORMAL HIGH (ref 4.8–5.6)
Mean Plasma Glucose: 119.76 mg/dL

## 2017-12-09 LAB — ECHOCARDIOGRAM LIMITED
Height: 70 in
Weight: 2800 oz

## 2017-12-09 MED ORDER — DIGOXIN 125 MCG PO TABS
0.1250 mg | ORAL_TABLET | Freq: Every day | ORAL | Status: DC
  Administered 2017-12-10: 0.125 mg via ORAL
  Filled 2017-12-09: qty 1

## 2017-12-09 MED ORDER — NYSTATIN 100000 UNIT/ML MT SUSP
5.0000 mL | Freq: Four times a day (QID) | OROMUCOSAL | Status: DC
  Administered 2017-12-09 – 2017-12-10 (×4): 500000 [IU] via ORAL
  Filled 2017-12-09 (×6): qty 5

## 2017-12-09 MED ORDER — WARFARIN SODIUM 3 MG PO TABS
3.0000 mg | ORAL_TABLET | Freq: Once | ORAL | Status: AC
  Administered 2017-12-09: 3 mg via ORAL
  Filled 2017-12-09: qty 1

## 2017-12-09 MED ORDER — ATORVASTATIN CALCIUM 10 MG PO TABS
20.0000 mg | ORAL_TABLET | Freq: Every day | ORAL | Status: DC
  Administered 2017-12-09: 20 mg via ORAL
  Filled 2017-12-09: qty 2

## 2017-12-09 MED ORDER — PANTOPRAZOLE SODIUM 40 MG PO TBEC
40.0000 mg | DELAYED_RELEASE_TABLET | Freq: Two times a day (BID) | ORAL | Status: DC
  Administered 2017-12-09 – 2017-12-10 (×2): 40 mg via ORAL
  Filled 2017-12-09 (×2): qty 1

## 2017-12-09 NOTE — Progress Notes (Signed)
Initial Nutrition Assessment  DOCUMENTATION CODES:   Non-severe (moderate) malnutrition in context of chronic illness  INTERVENTION:  1. Ensure Enlive po BID, each supplement provides 350 kcal and 20 grams of protein  NUTRITION DIAGNOSIS:   Moderate Malnutrition related to chronic illness as evidenced by moderate muscle depletion, moderate fat depletion, percent weight loss.  GOAL:   Patient will meet greater than or equal to 90% of their needs  MONITOR:   PO intake, I & O's, Labs, Supplement acceptance, Weight trends  REASON FOR ASSESSMENT:   Malnutrition Screening Tool    ASSESSMENT:   84 y.o. male with Afib on warfarin, HTN, HLD, SIADH, OSA, thrombocytopenia presents with acute aphasia yesterday morning.  CT Head was performed showed no bleed, however showed bilateral subdural hematomas vs chronic SDH. Found Acute Ischemic Left MCA stroke    Spoke with [name], family at bedside. Was recently admitted from 11/18/17 - 11/27/17 due to shortness of breath, was unable to sleep. Was 189 pounds upon admission. He states he is now down to 168 pounds. When he went home, ate mostly bites related to mouth ulcers. Per chart it appears he is 175 pounds now, a 14 pound/7.4% severe weight loss over 1 month.  Son states PO intake began to improve the day before he presented to ED for this admission. According to son and wife, patient is very picky with food, texture, and temperature. Was eating lunch during visit, appetite is good.   RD provided "Heart Failure Nutrition Therapy" handout from the Academy of [employer]. Reviewed patient's dietary recall. Provided examples on ways to decrease sodium intake in diet. Discouraged intake of processed foods and use of salt shaker. Encouraged fresh fruits and vegetables as well as whole grain sources of carbohydrates to maximize fiber intake.   RD discussed why it is important for patient to adhere to diet recommendations, and  emphasized the role of fluids, foods to avoid, and importance of weighing self daily. Teach back method used.  Expect fair compliance.  Body mass index is 25.11 kg/m. Pt meets criteria for overweight based on current BMI.  Current diet order is heart healthy/carb modified, patient is consuming approximately 100% of meals at this time. Labs and medications reviewed. No further nutrition interventions warranted at this time. RD contact information provided. If additional nutrition issues arise, please re-consult RD.   Labs reviewed:  Na 133, BUN 35,  Medications reviewed and include:  Warfarin   NUTRITION - FOCUSED PHYSICAL EXAM:    Most Recent Value  Orbital Region  Moderate depletion  Upper Arm Region  Severe depletion  Thoracic and Lumbar Region  Moderate depletion  Buccal Region  Mild depletion  Temple Region  Moderate depletion  Clavicle Bone Region  Moderate depletion  Clavicle and Acromion Bone Region  Moderate depletion  Scapular Bone Region  Moderate depletion  Dorsal Hand  Moderate depletion  Patellar Region  Moderate depletion  Anterior Thigh Region  Moderate depletion  Posterior Calf Region  Moderate depletion  Edema (RD Assessment)  None  Hair  Reviewed  Eyes  Reviewed  Mouth  Reviewed  Skin  Reviewed  Nails  Reviewed       Diet Order:  Fall precautions Diet heart healthy/carb modified Room service appropriate? Yes; Fluid consistency: Thin  EDUCATION NEEDS:   Education needs have been addressed  Skin:  Skin Assessment: Reviewed RN Assessment  Last BM:  12/06/2017  Height:   Ht Readings from Last 1 Encounters:  12/08/17 5'  10" (1.778 m)    Weight:   Wt Readings from Last 1 Encounters:  12/08/17 175 lb (79.4 kg)    Ideal Body Weight:  75.45 kg  BMI:  Body mass index is 25.11 kg/m.  Estimated Nutritional Needs:   Kcal:  5041-3643 calories  Protein:  95-120 grams (1.2-1.5g/kg)  Fluid:  2L   Ryan Savage. Ryan Thum, MS, RD LDN Inpatient  Clinical Dietitian Pager [phone]

## 2017-12-09 NOTE — Progress Notes (Signed)
ANTICOAGULATION CONSULT NOTE - [place] for warfarin Indication: atrial fibrillation  Allergies  Allergen Reactions  . Novocain [Procaine] Rash    Patient Measurements: Height: 5\' 10"  (177.8 cm) Weight: 175 lb (79.4 kg) IBW/kg (Calculated) : 73   Vital Signs: Temp: 97.5 F (36.4 C) (01/29 0927) Temp Source: Oral (01/29 0927) BP: 109/60 (01/29 0927) Pulse Rate: 90 (01/29 0927)  Labs: Recent Labs    12/08/17 1230 12/08/17 1253 12/09/17 0513  HGB 13.5 13.9  --   HCT 40.3 41.0  --   PLT 190  --   --   APTT 30  --   --   LABPROT 20.2*  --  19.4*  INR 1.74  --  1.66  CREATININE 1.09 1.00  --     Estimated Creatinine Clearance: 54.8 mL/min (by C-G formula based on SCr of 1 mg/dL).   Medical History: Past Medical History:  Diagnosis Date  . Atrial fibrillation, chronic (HCC)    Chronic..coumadin clinic -Dr Radford Pax  . BPH (benign prostatic hypertrophy)   . CHF (congestive heart failure) ([place])   . [name] (degenerative joint disease)   . Hyperlipidemia    LDL goal less then 100  . Hypertension   . Microscopic hematuria    [name]  . Mitral valve prolapse    with moderate MR  . OSA (obstructive sleep apnea)    intolerant with CPAP (11/19/2017)  . SIADH (syndrome of inappropriate ADH production) ([place])   . Skin cancer    "side of my nose" (11/19/2017)  . Thrombocytopenia (HCC)    Mild- platelet count 143,000 on 02/2011, stable 08/2011    Assessment: 84 yo M admitted with altered mental status, acute aphasia, R sided weakness.  Called as code stroke. tPA was not given due to the patient being on warfarin and INR >1.7. Current INR is 1.66.   PTA warfarin: Pt takes 2 mg and 1 mg on alternating days  Goal of Therapy:  INR 2-3 Monitor platelets by anticoagulation protocol: Yes  Plan:  -Warfarin 3 mg po x1 -Daily INR -D/C ASA when INR > 2 per MD order   Horton Chin, Pharm.D., BCPS Clinical Pharmacist Pager:  [phone] Clinical phone for 12/09/2017 from 8:30-4:00 is [phone]. After 4pm, please call Main [facility] [phone]) for assistance. 12/09/2017 10:47 AM

## 2017-12-09 NOTE — Evaluation (Signed)
Occupational Therapy Evaluation Patient Details Name: Ryan Savage MRN: [id number] DOB: Mar 28, 1929 Today's Date: 12/09/2017    History of Present Illness 84 y.o. male with medical history significant for SIADH, sleep apnea not on CPAP, mitral valve prolapse, hypertension, dyslipidemia, A. fib on Coumadin and thrombocytopenia.  Patient has also been recently diagnosed with two-vessel severe [name] not amenable to PTCA and new systolic heart failure.  Patient was discharged home with home health 1/17. Presents to the ER where dysarthria persists and he was noted to have subtle right upper extremity drift. MRI showing left MCA and smaller l posterior frontal parasagittal cortex.      Clinical Impression   PTA, pt was living with his wife and was independent. Pt currently requiring Min Guard A for ADLs and functional mobility with RW due to decreased safety awareness and balance. Pt presenting with decreased cognition, coordination, and balance. Pt very agreeable to therapy and motivated to return to PLOF. Pt would benefit from further acute OT to facilitate safe dc. Recommend dc home with 24 hour supervision and HHOT to optimize safety and independence with ADLs and functional mobility.    Follow Up Recommendations  Home health OT;Supervision/Assistance - 24 hour    Equipment Recommendations  3 in 1 bedside commode    Recommendations for Other Services PT consult     Precautions / Restrictions        Mobility Bed Mobility Overal bed mobility: Needs Assistance Bed Mobility: Supine to Sit     Supine to sit: Min assist;HOB elevated     General bed mobility comments: Min A to assisting in pulling hips towards EOB due to rib pain. Pt using bed rail and elevate HOB  Transfers Overall transfer level: Needs assistance Equipment used: Rolling walker (2 wheeled) Transfers: Sit to/from Stand Sit to Stand: Min guard         General transfer comment: Min Guard for safety and VCs for  hand placement    Balance Overall balance assessment: Needs assistance Sitting-balance support: Feet supported Sitting balance-Leahy Scale: Good Sitting balance - Comments: Able to reach forward and adjust socks at EOB   Standing balance support: No upper extremity supported;During functional activity Standing balance-Leahy Scale: Fair Standing balance comment: Noted posterior lean during grooming at sink.                            ADL either performed or assessed with clinical judgement   ADL Overall ADL's : Needs assistance/impaired Eating/Feeding: Set up;Sitting   Grooming: Oral care;Wash/dry face;Brushing hair;Min guard;Standing Grooming Details (indicate cue type and reason): Min guard for safety. Noted posterior lean. Pt demonstrating FM skills to manage tooth paste and manipulate comb in hand.  Upper Body Bathing: Set up;Sitting;Supervision/ safety   Lower Body Bathing: Min guard;Sit to/from stand   Upper Body Dressing : Set up;Supervision/safety;Sitting   Lower Body Dressing: Min guard;Sit to/from stand Lower Body Dressing Details (indicate cue type and reason): Pt able to reach forward and don socks Toilet Transfer: Min guard;Ambulation;RW(Simulated to recliner) Armed forces technical officer Details (indicate cue type and reason): Min Guard for safety         Functional mobility during ADLs: Min guard;Rolling walker General ADL Comments: Pt demonstrating decreased functional performance and is impacted by decreased activity tolerance, balance, and cognition. BP before activity 109/60 and 107/61 after activity.     Vision Baseline Vision/History: Wears glasses Wears Glasses: At all times Patient Visual Report: No change from baseline  Vision Assessment?: Yes;Vision impaired- to be further tested in functional context Eye Alignment: Within Functional Limits Ocular Range of Motion: Within Functional Limits Tracking/Visual Pursuits: Impaired - to be further tested in  functional context;Requires cues, head turns, or add eye shifts to track;Unable to hold eye position out of midline;Decreased smoothness of horizontal tracking;Decreased smoothness of vertical tracking Convergence: Within functional limits Additional Comments: Pt with fatigue and unable to maintain gaze outside of midline during trackign     Perception     Praxis      Pertinent Vitals/Pain Pain Assessment: Faces Faces Pain Scale: Hurts little more Pain Location: L ribs, "tail bone", and head ache Pain Descriptors / Indicators: Discomfort;Grimacing Pain Intervention(s): Monitored during session;Repositioned     Hand Dominance Right   Extremity/Trunk Assessment Upper Extremity Assessment Upper Extremity Assessment: RUE deficits/detail;LUE deficits/detail RUE Deficits / Details: Decreased strength compared to LUE. decreased coordination as seen during finger opposition and finger-to-nose test. RUE Coordination: decreased fine motor LUE Deficits / Details: Decreased coordination as seen during finger to nose test and finger opposition LUE Coordination: decreased fine motor   Lower Extremity Assessment Lower Extremity Assessment: Defer to PT evaluation   Cervical / Trunk Assessment Cervical / Trunk Assessment: Normal   Communication Communication Communication: No difficulties   Cognition Arousal/Alertness: Awake/alert Behavior During Therapy: WFL for tasks assessed/performed Overall Cognitive Status: No family/caregiver present to determine baseline cognitive functioning Area of Impairment: Attention;Following commands;Safety/judgement;Problem solving;Memory                   Current Attention Level: Selective Memory: Decreased short-term memory Following Commands: Follows one step commands with increased time;Follows multi-step commands inconsistently Safety/Judgement: Decreased awareness of safety   Problem Solving: Slow processing;Requires verbal cues General  Comments: Pt presenting with slow processing and decreased attention. Pt requiring increased cues to follow commands. Very motivated to return to PLOF.    General Comments       Exercises     Shoulder Instructions      Home Living Family/patient expects to be discharged to:: Private residence Living Arrangements: Spouse/significant other Available Help at Discharge: Family;Available 24 hours/day Type of Home: House Home Access: Stairs to enter [employer] of Steps: 6(3 and then 3) Entrance Stairs-Rails: Right;Left Home Layout: One level     Bathroom Shower/Tub: Walk-in shower;Door   [employer] Toilet: Standard     Home Equipment: Environmental consultant - standard   Additional Comments: Very active before recent illness  Lives With: Spouse    Prior Functioning/Environment Level of Independence: Independent        Comments: ADLs, IADLs, driving        OT Problem List: Decreased activity tolerance;Impaired balance (sitting and/or standing);Impaired vision/perception;Decreased cognition;Decreased safety awareness;Decreased knowledge of use of DME or AE;Impaired UE functional use;Pain      OT Treatment/Interventions: Self-care/ADL training;Therapeutic exercise;Energy conservation;DME and/or AE instruction;Therapeutic activities;Patient/family education    OT Goals(Current goals can be found in the care plan section) Acute Rehab OT Goals Patient Stated Goal: Go home OT Goal Formulation: With patient Time For Goal Achievement: 12/23/17 Potential to Achieve Goals: Good ADL Goals Pt Will Perform Grooming: with modified independence;standing Pt Will Perform Upper Body Dressing: with modified independence;sitting Pt Will Perform Lower Body Dressing: with modified independence;sit to/from stand Pt Will Transfer to Toilet: with modified independence;ambulating;bedside commode  OT Frequency: Min 3X/week   Barriers to D/C:            Co-evaluation  AM-PAC  PT "6 Clicks" Daily Activity     Outcome Measure Help from another person eating meals?: None Help from another person taking care of personal grooming?: A Little Help from another person toileting, which includes using toliet, bedpan, or urinal?: A Little Help from another person bathing (including washing, rinsing, drying)?: A Little Help from another person to put on and taking off regular upper body clothing?: A Little Help from another person to put on and taking off regular lower body clothing?: A Little 6 Click Score: 19   End of Session Equipment Utilized During Treatment: Gait belt;Rolling walker Nurse Communication: Mobility status  Activity Tolerance: Patient tolerated treatment well Patient left: in chair;with call bell/phone within reach;with chair alarm set  OT Visit Diagnosis: Unsteadiness on feet (R26.81);Other abnormalities of gait and mobility (R26.89);Muscle weakness (generalized) (M62.81);Other symptoms and signs involving cognitive function;Hemiplegia and hemiparesis Hemiplegia - Right/Left: Right Hemiplegia - dominant/non-dominant: Dominant Hemiplegia - caused by: Cerebral infarction                Time: 4496-7591 OT Time Calculation (min): 37 min Charges:  OT General Charges $OT Visit: 1 Visit OT Evaluation $OT Eval Moderate Complexity: 1 Mod OT Treatments $Self Care/Home Management : 8-22 mins G-Codes:     Azka Steger MSOT, OTR/L Acute Rehab Pager: [phone] Office: [place] 12/09/2017, 10:07 AM

## 2017-12-09 NOTE — Evaluation (Addendum)
Speech Language Pathology Evaluation Patient Details Name: Ryan Savage MRN: [id number] DOB: [birth year] Today's Date: 12/09/2017 Time: 9528-4132 SLP Time Calculation (min) (ACUTE ONLY): 45 min  Problem List:  Patient Active Problem List   Diagnosis Date Noted  . Cerebral embolism with cerebral infarction 12/09/2017  . Odynophagia 12/08/2017  . Syncope and collapse 12/08/2017  . Hyperlipidemia 12/08/2017  . OSA (obstructive sleep apnea) 12/08/2017  . Hypertension 12/08/2017  . Dysarthria 12/08/2017  . [name] (coronary artery disease) 12/08/2017  . Chronic systolic heart failure ([place]) 12/08/2017  . Chronic atrial fibrillation ([place]) 12/08/2017  . Syndrome of inappropriate ADH (SIADH) secretion ([place]) 12/08/2017  . Acute systolic congestive heart failure ([place])   . Atrial fibrillation, chronic ([place])   . SIADH (syndrome of inappropriate ADH production) ([place])   . Subclinical hyperthyroidism   . Acute on chronic systolic heart failure ([place]) 11/20/2017  . Acute exacerbation of CHF (congestive heart failure) ([place]) 11/18/2017  . Hyponatremia with excess extracellular fluid volume 11/18/2017  . Hyperglycemia 11/18/2017  . Supratherapeutic INR 11/18/2017  . Mitral valve prolapse   . Permanent atrial fibrillation ([place]) 11/08/2013  . Benign essential HTN 11/08/2013  . Mitral regurgitation 11/08/2013   Past Medical History:  Past Medical History:  Diagnosis Date  . Atrial fibrillation, chronic (HCC)    Chronic..coumadin clinic -Dr Radford Pax  . BPH (benign prostatic hypertrophy)   . CHF (congestive heart failure) ([place])   . [name] (degenerative joint disease)   . Hyperlipidemia    LDL goal less then 100  . Hypertension   . Microscopic hematuria    [name]  . Mitral valve prolapse    with moderate MR  . OSA (obstructive sleep apnea)    intolerant with CPAP (11/19/2017)  . SIADH (syndrome of inappropriate ADH production) ([place])   . Skin cancer    "side of my nose" (11/19/2017)  .  Thrombocytopenia (HCC)    Mild- platelet count 143,000 on 02/2011, stable 08/2011   Past Surgical History:  Past Surgical History:  Procedure Laterality Date  . APPENDECTOMY  1950  . LUMBAR DISC SURGERY  1959   ruptured disc repair  . RIGHT/LEFT HEART CATH AND CORONARY ANGIOGRAPHY N/A 11/21/2017   Procedure: RIGHT/LEFT HEART CATH AND CORONARY ANGIOGRAPHY;  Surgeon: Sherren Mocha, MD;  Location: [facility];  Service: Cardiovascular;  Laterality: N/A;  . SKIN CANCER EXCISION Left    "side of my nose"   HPI:  84 y.o. male with Afib on warfarin, HTN, HLD, SIADH, OSA, thrombocytopenia presents with acute aphasia yesterday morning.  CT Head was performed showed no bleed, however showed bilateral subdural hematomas vs chronic SDH.    Assessment / Plan / Recommendation Clinical Impression   Pt presents with mild memory deficits; however, it is unclear whether these are baseline versus acute in nature as family was not present to verify baseline level of functioning.  Pt reports vague and at times contradictory information regarding his previous level of function (ie unclear whether pt or his wife drove, pt indicates he and his wife managed their own medications and finances but has a legal guardian indicated in his chart).  Despite this, pt could provide relatively accurate information regarding his hospital course over the last month.  Suspect that pt had some extent of baseline memory impairment which could now be exacerbated by acute illness.  Speech was fluent and free from dysarthria or word finding impairment on evaluation; however, pt's speech is tangential and disorganized at times.  Overall, pt needed  mod assist to complete tasks today given deficits mentioned above.  Pt would benefit from home health ST follow up to maximize functional independence for cognition in his home environment once discharged.      SLP Assessment  SLP Recommendation/Assessment: Patient needs continued Speech  [place] [facility] SLP Visit Diagnosis: Cognitive communication deficit (R41.841)    Follow Up Recommendations  Home health SLP;24 hour supervision/assistance    Frequency and Duration min 1 x/week         SLP Evaluation Cognition  Overall Cognitive Status: No family/caregiver present to determine baseline cognitive functioning Arousal/Alertness: Awake/alert Orientation Level: Oriented X4 Attention: Sustained Sustained Attention: Appears intact Memory: Impaired Memory Impairment: Decreased recall of new information Awareness: Appears intact Problem Solving: Appears intact Safety/Judgment: Appears intact       Comprehension  Auditory Comprehension Overall Auditory Comprehension: Appears within functional limits for tasks assessed    Expression Expression Primary Mode of Expression: Verbal Verbal Expression Overall Verbal Expression: Appears within functional limits for tasks assessed(slightly tangential, could be baseline)   Oral / Motor  Oral Motor/Sensory Function Overall Oral Motor/Sensory Function: Within functional limits Motor Speech Overall Motor Speech: Appears within functional limits for tasks assessed   GO          Functional Assessment Tool Used: cognitive-linguistic evaluation completed  Functional Limitations: Memory Memory Current Status (B2841): At least 20 percent but less than 40 percent impaired, limited or restricted Memory Goal Status (L2440): At least 1 percent but less than 20 percent impaired, limited or restricted         Windell Moulding L 12/09/2017, 8:59 AM

## 2017-12-09 NOTE — Evaluation (Signed)
Physical Therapy Evaluation Patient Details Name: Ryan Savage MRN: [id number] DOB: 1929/06/04 Today's Date: 12/09/2017   History of Present Illness  Patient is a 84 y/o male who presents with speech difficulties, dizziness, AMS and RUE weakness. 2 falls recently. MRI- acute for infarcts in left MCA territory and left posterior frontal cortex. PMH includes sleep apnea, MVP, HTN, A-fib, thrombocytopenia, [name], HF.  Clinical Impression  Patient presents with generalized weakness, speech difficulties, balance deficits, impaired attention/memory and impaired mobility s/p above. Tolerated gait training with Min A for balance/safety. Might do better with use of RW. Education re: what to do at home when feeling dizzy as this resulted in 2 falls. Pt independent PTA and lives with wife, drives and loves to walk. Recommend 24/7 supervision at home. Will follow acutely to maximize independence and mobility prior to return home.     Follow Up Recommendations Home health PT;Supervision for mobility/OOB    Equipment Recommendations  None recommended by PT    Recommendations for Other Services       Precautions / Restrictions Precautions Precautions: Fall Restrictions Weight Bearing Restrictions: No      Mobility  Bed Mobility Overal bed mobility: Needs Assistance Bed Mobility: Supine to Sit     Supine to sit: Min assist;HOB elevated     General bed mobility comments: Sitting in chair upon PT arrival.   Transfers Overall transfer level: Needs assistance Equipment used: None Transfers: Sit to/from Stand Sit to Stand: Min guard         General transfer comment: Min Guard for safety and VCs for hand placement. Stood from Youth worker.   Ambulation/Gait Ambulation/Gait assistance: Min assist Ambulation Distance (Feet): 150 Feet Assistive device: 1 person hand held assist Gait Pattern/deviations: Step-through pattern;Decreased stride length;Drifts right/left;Trunk flexed Gait velocity:  Decreased   General Gait Details: Slow, unsteady gait with bil knee instability noted. Reaching for rail for support. Will do well with RW.   Stairs            Wheelchair Mobility    Modified Rankin (Stroke Patients Only) Modified Rankin (Stroke Patients Only) Pre-Morbid Rankin Score: Slight disability Modified Rankin: Moderately severe disability     Balance Overall balance assessment: Needs assistance Sitting-balance support: Feet supported;No upper extremity supported Sitting balance-Leahy Scale: Good Sitting balance - Comments: Able to reach forward and adjust socks at EOB   Standing balance support: During functional activity Standing balance-Leahy Scale: Fair Standing balance comment: Able to stand statically without UE support but requires Min A for dynamic standing.                              Pertinent Vitals/Pain Pain Assessment: Faces Faces Pain Scale: Hurts little more Pain Location: L ribs, "tail bone", and head ache Pain Descriptors / Indicators: Discomfort;Grimacing Pain Intervention(s): Monitored during session;Repositioned    Home Living Family/patient expects to be discharged to:: Private residence Living Arrangements: Spouse/significant other Available Help at Discharge: Family;Available 24 hours/day Type of Home: House Home Access: Stairs to enter Entrance Stairs-Rails: Right Entrance Stairs-Number of Steps: 3 + 3 Home Layout: One level Home Equipment: Walker - standard Additional Comments: Very active before recent illness    Prior Function Level of Independence: Independent         Comments: ADLs, IADLs, driving. [place]  Reports 2 falls last week due to dizziness (most likely related to taking medication wrong).  Hand Dominance   Dominant Hand: Right    Extremity/Trunk Assessment   Upper Extremity Assessment Upper Extremity Assessment: Defer to OT evaluation RUE Deficits / Details:  Decreased strength compared to LUE. decreased coordination as seen during finger opposition and finger-to-nose test. RUE Coordination: decreased fine motor LUE Deficits / Details: Decreased coordination as seen during finger to nose test and finger opposition LUE Coordination: decreased fine motor    Lower Extremity Assessment Lower Extremity Assessment: Generalized weakness(Grossly ~5/5 throughout )    Cervical / Trunk Assessment Cervical / Trunk Assessment: Normal  Communication   Communication: Expressive difficulties  Cognition Arousal/Alertness: Awake/alert Behavior During Therapy: WFL for tasks assessed/performed Overall Cognitive Status: No family/caregiver present to determine baseline cognitive functioning Area of Impairment: Attention;Following commands;Safety/judgement;Problem solving;Memory                   Current Attention Level: Selective Memory: Decreased short-term memory Following Commands: Follows multi-step commands with increased time Safety/Judgement: Decreased awareness of safety   Problem Solving: Slow processing;Requires verbal cues General Comments: Tangential speech at times. Slow processing and decreased attention noted.       General Comments General comments (skin integrity, edema, etc.): Bp pre session 107/77, post session BP 113/71, HR ranging from 80-95 bpm irregular rhythm.    Exercises     Assessment/Plan    PT Assessment Patient needs continued PT services  PT Problem List Decreased activity tolerance;Decreased balance;Decreased mobility;Decreased cognition;Pain;Decreased coordination       PT Treatment Interventions Gait training;Stair training;Functional mobility training;Therapeutic activities;Therapeutic exercise;Balance training;Patient/family education;Neuromuscular re-education    PT Goals (Current goals can be found in the Care Plan section)  Acute Rehab PT Goals Patient Stated Goal: Go home PT Goal Formulation: With  patient Time For Goal Achievement: 12/23/17 Potential to Achieve Goals: Good    Frequency Min 4X/week   Barriers to discharge Inaccessible home environment stairs to enter home    Co-evaluation               AM-PAC PT "6 Clicks" Daily Activity  Outcome Measure Difficulty turning over in bed (including adjusting bedclothes, sheets and blankets)?: None Difficulty moving from lying on back to sitting on the side of the bed? : None Difficulty sitting down on and standing up from a chair with arms (e.g., wheelchair, bedside commode, etc,.)?: None Help needed moving to and from a bed to chair (including a wheelchair)?: A Little Help needed walking in hospital room?: A Little Help needed climbing 3-5 steps with a railing? : A Little 6 Click Score: 21    End of Session Equipment Utilized During Treatment: Gait belt Activity Tolerance: Patient tolerated treatment well Patient left: in chair;with call bell/phone within reach;with chair alarm set Nurse Communication: Mobility status PT Visit Diagnosis: Hemiplegia and hemiparesis;Difficulty in walking, not elsewhere classified (R26.2);Muscle weakness (generalized) (M62.81) Hemiplegia - Right/Left: Right Hemiplegia - dominant/non-dominant: Dominant Hemiplegia - caused by: Cerebral infarction    Time: 3976-7341 PT Time Calculation (min) (ACUTE ONLY): 28 min   Charges:   PT Evaluation $PT Eval Low Complexity: 1 Low PT Treatments $Gait Training: 8-22 mins   PT G Codes:        Wray Kearns, PT, DPT [phone]    Marguarite Arbour A Wendi Lastra 12/09/2017, 10:36 AM

## 2017-12-09 NOTE — Progress Notes (Signed)
NEUROHOSPITALISTS STROKE TEAM - DAILY PROGRESS NOTE   ADMISSION HISTORY: Ryan Savage is an 84 y.o. male with Afib on warfarin, HTN, HLD, SIADH, OSA, thrombocytopenia presents with acute aphasia this morning.   According to wife patient having breakfast dropped spoon from his hand and speaking with any sense. This was around 11 am, last seen normal by home health nurse at 10.25 am.  She called EMS and symptoms had improved on arrival. While in ED his symptoms became worse again and EDP noted right arm drift and stroke alerted the patient.     On assessment, patient was aphasic and right side weakness without obvious neglect. CT Head was performed showed no bleed, however showed bilateral subdural hematomas vs chronic SDH. TPA was considered as defecits were disabling, however not given due to patient being on Warfarin and INR was >1.7 (1.74). CTA negative for LVO.   Date last known well: 1.28.19 Time last known well: 10.25 am tPA Given: no, on anticoagulation NIHSS: 5  Baseline MRS 0  SUBJECTIVE (INTERVAL HISTORY) Wife and son is at the bedside. Patient is found laying in bed in NAD. Overall he feels his condition is rapidly improving. Voices no new complaints. No new events reported overnight. Patient states he was discharged from the hospital 2 days ago. He was managing all of his home medications. Does not believe he missed any doses of Coumadin.   OBJECTIVE Lab Results: CBC:  Recent Labs  Lab 12/08/17 1230 12/08/17 1253  WBC 8.9  --   HGB 13.5 13.9  HCT 40.3 41.0  MCV 93.9  --   PLT 190  --    BMP: Recent Labs  Lab 12/08/17 1230 12/08/17 1253  NA 133* 133*  K 4.1 4.2  CL 95* 95*  CO2 27  --   GLUCOSE 126* 122*  BUN 31* 35*  CREATININE 1.09 1.00  CALCIUM 8.7*  --    Liver Function Tests:  Recent Labs  Lab 12/08/17 1230  AST 22  ALT 17  ALKPHOS 53  BILITOT 1.0  PROT 6.9  ALBUMIN 3.0*   Thyroid Function  Studies:  Recent Labs    12/08/17 1620  TSH 0.773   Coagulation Studies:  Recent Labs    12/08/17 1230 12/09/17 0513  APTT 30  --   INR 1.74 1.66   [place] Work -Up:  Recent Labs    12/08/17 1620  VITAMINB12 772  FOLATE 27.0  IRON 51  RETICCTPCT 2.1   PHYSICAL EXAM Temp:  [97.5 F (36.4 C)-98.4 F (36.9 C)] 97.5 F (36.4 C) (01/29 0927) Pulse Rate:  [74-99] 90 (01/29 0927) Resp:  [17-24] 17 (01/29 0927) BP: (90-173)/(51-157) 109/60 (01/29 0927) SpO2:  [94 %-100 %] 98 % (01/29 0927) General - Well nourished, well developed, in no apparent distress HEENT-  Normocephalic,  Cardiovascular - Regular rate and rhythm  Respiratory - Lungs clear bilaterally. No wheezing. Abdomen - soft and non-tender, BS normal Extremities- no edema or cyanosis Neurological Examination Mental Status: Alert, oriented, moderate Expressive and comprehensive aphasia. Not  Able to follow complexcommands, Cranial Nerves: II: Visual fields grossly normal,  III,IV, VI: ptosis not present, extra-ocular motions intact bilaterally, pupils equal, round, reactive to light and accommodation V,VII: smile symmetric, facial light touch sensation normal bilaterally VIII: hearing normal bilaterally IX,X: uvula rises symmetrically XI: bilateral shoulder shrug XII: midline tongue extension Motor: Right : Upper extremity   4/5    Left:     Upper extremity   5/5  Lower  extremity   4/5     Lower extremity   5/5 Tone and bulk:normal tone throughout; no atrophy noted Sensory: Pinprick and light touch intact throughout, bilaterally Deep Tendon Reflexes: 2+ and symmetric throughout Plantars: Right: downgoing   Left: downgoing Cerebellar: normal finger-to-nose, normal rapid alternating movements and normal heel-to-shin test Gait: normal gait and station  IMAGING: I have personally reviewed the radiological images below and agree with the radiology interpretations. Ct Angio Head and Neck W Or Wo  Contrast Result Date: 12/08/2017 IMPRESSION: 1. No emergent large vessel occlusion. 2. Mild intracranial atherosclerosis with mild bilateral ICA narrowing. 3. Widely patent cervical carotid arteries. 4. Mild left vertebral artery origin stenosis. 5.  Aortic Atherosclerosis (ICD10-I70.0). 6. Biapical parenchymal lung changes concerning for interstitial lung disease. Preliminary findings of no large vessel occlusion were communicated to Dr. Lorraine Lax at 1:29 pm on 12/08/2017 by text page via the [facility] messaging system. Electronically Signed   By: Logan Bores M.D.   On: 12/08/2017 13:44   Mr Brain Wo Contrast Result Date: 12/08/2017 IMPRESSION: Acute LEFT MCA periventricular deep white matter infarct could account for RIGHT arm weakness. Smaller punctate area of acute infarction in the LEFT posterior frontal parasagittal cortex could affect the RIGHT leg. Both infarcts likely reflect sequelae of emboli from atrial fibrillation. BILATERAL subdural hygromas, without signs of chronic blood products. Atrophy with mild small vessel disease. Electronically Signed   By: Staci Righter M.D.   On: 12/08/2017 19:09   Ct Head Code Stroke Wo Contrast Result Date: 12/08/2017 IMPRESSION: 1. Positive for bilateral subdural hygromas or very low-density subdural hematomas, each 5-6 mm in thickness. No midline shift. Patent basilar cisterns. 2. No superimposed acute cortically based infarct or hyperdense intracranial hemorrhage identified. 3. ASPECTS is 10. 4. These results were communicated to Dr. Lorraine Lax at 1:06 pmon 1/28/2019by text page via the [facility] messaging system. Electronically Signed   By: Genevie Ann M.D.   On: 12/08/2017 13:06   Echocardiogram:                                              PENDING  B/L Carotid U/S:   1-39% internal carotid artery stenosis bilaterally. Vertebral arteries are patent with antegrade flow.    IMPRESSION: [name] is a 84 y.o. male with PMH of Afib on warfarin, HTN, HLD, SIADH, OSA,  thrombocytopenia presents with acute aphasia this morning. tPA not given as patient on warfarin and INR was 1.74. Also patient has bilateral subdural hygroma vs SDH. CTA negative for LVO. MRI reveals:  Acute LEFT MCA periventricular deep white matter infarct Smaller punctate area of acute infarction in the LEFT posterior frontal parasagittal cortex  STROKE:  Suspected Etiology: cardioembolic secondary to AFIB with subtherapeutic INR on Coumadin Resultant Symptoms: right sided weakness and aphasia Stroke Risk Factors: atrial fibrillation, hyperlipidemia and hypertension Other Stroke Risk Factors: Advanced age, CHF  Outstanding Stroke Work-up Studies:     Echocardiogram:                                                    PENDING  12/09/2017 ASSESSMENT:   Neuro exam stable with resolving right sided deficits. Encouraged patient to have son assist with home medication  administration. Patient complaining of mouth sore and chronic throat clearing since discharge 2 days ago. Imaging, labs and POC reviewed with patient and family at bedside.  PLAN  12/09/2017: Continue Warfarin with ASA bridge/ Statin Discontinue ASA once Warfarin at therapeutic level  Frequent neuro checks Telemetry monitoring PT/OT/SLP Consult PM & Rehab ongoing aggressive stroke risk factor management Patient counseled to be compliant with his antithrombotic medications Patient counseled on Lifestyle modifications including, Diet, Exercise, and Stress Follow up with [facility] in 6 weeks  BILATERAL subdural hygromas, without signs of chronic blood products. Outpatient Neurology follow up and surveillance  Possible DYSPHAGIA: SLP to re-evaluate swallow Aspiration Precautions in progress  Possible oral thrush patch - Back of throat on Left Trial Nystatin suspension  AFIB, CHRONIC: INR subtherapeutic on Coumadin on admission Continue Metoprolol for Rate control as needed Resume Coumadin with goal INR  2.5-3.5, per patient Provide Coumadin teaching.   HYPERTENSION: Stable with some episodes of Hypotension overnight Avoid Hypotension and Dehydration Permissive hypertension (OK if <220/120) for 24-48 hours post stroke and then gradually normalized within 5-7 days. Long term BP goal normotensive. May slowly restart home B/P medications after 48 hours Home Meds: Metoprolol  HYPERLIPIDEMIA:    Component Value Date/Time   CHOL 76 12/09/2017 0513   TRIG 73 12/09/2017 0513   HDL 25 (L) 12/09/2017 0513   CHOLHDL 3.0 12/09/2017 0513   VLDL 15 12/09/2017 0513   LDLCALC 36 12/09/2017 0513  Home Meds:  Lipitor 20 mg LDL  goal < 70 Continued on Lipitor to 20 mg daily Continue statin at discharge  PRE- DIABETES: Lab Results  Component Value Date   HGBA1C 5.8 (H) 12/09/2017  HgbA1c goal < 7.0 Continue CBG monitoring and SSI to maintain glucose 140-180 mg/dl DM education   Other Active Problems: Principal Problem:   Dysarthria Active Problems:   Odynophagia   Syncope and collapse   Hyperlipidemia   OSA (obstructive sleep apnea)   Hypertension   [name] (coronary artery disease)   Chronic systolic heart failure (HCC)   Chronic atrial fibrillation (HCC)   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 Yes; Fluid consistency: Thin   FAMILY UPDATES: family at bedside  TEAM UPDATES: Nita Sells, MD   Prior Home Stroke Medications:  warfarin daily  Discharge Stroke Meds:  Please discharge patient on warfarin daily   Disposition: 06-Home-Health Care Svc Therapy Recs:               PENDING Follow Up:  Follow-up Information    Dennie Bible, NP. Schedule an appointment as soon as possible for a visit in 6 week(s).   Specialty:  Family Medicine Contact information: [address] [place] [address] [phone]          Lajean Manes, MD -PCP Follow up in 1-2 weeks      Assessment & plan discussed with with attending physician and they are in agreement.    Renie Ora Stroke Neurology Team 12/09/2017 3:23 PM  ATTENDING NOTE: I reviewed above note and agree with the assessment and plan. I have made any additions or clarifications directly to the above note. Pt was seen and examined.   84 year old male with history of A. fib on Coumadin, CHF, HLD, HTN, OSA and thrombocytopenia admitted for acute fascia with right  arm drift.  CT showed bilateral subdural hygromas, no bleeding.  CTA head and neck no LVO.  INR 1.74, subtherapeutic.  MRI showed left CR white matter and left corpus callosum small infarcts.  EF 35% on 11/19/17.  LDL 36 and A1c 5.8.  Patient stroke most likely due to A. fib on Coumadin but with subtherapeutic INR.  Coumadin restarted, with goal 2-3.  Bridge with aspirin 325.  Once INR goal 2-3, aspirin can be discontinued.  Continue Lipitor for stroke prevention.  Neurology will sign off. Please call with questions. Pt will follow up with Cecille Rubin, NP, at [facility] in about 6 weeks. Thanks for the consult.   Rosalin Hawking, MD PhD Stroke Neurology 12/09/2017 6:48 PM     To contact Stroke Continuity provider, please refer to [email or web address] After hours, contact General Neurology

## 2017-12-09 NOTE — Progress Notes (Signed)
*  PRELIMINARY RESULTS* Vascular Ultrasound Carotid Duplex (Doppler) has been completed.   Findings suggest 1-39% internal carotid artery stenosis bilaterally. Vertebral arteries are patent with antegrade flow.  12/09/2017 3:12 PM Maudry Mayhew, BS, RVT, RDCS, RDMS

## 2017-12-09 NOTE — Progress Notes (Signed)
Hospitalist progress note   Ryan Savage  ZOX:[id number] DOB: 16-Mar-1929 DOA: 12/08/2017 PCP: Lajean Manes, MD   Specialists:   Brief Narrative:  52 m systolic hf, chr afib [place] 5, mvp, htn, hyponatremia-siadh, subcl hypothyroid, hyperglycemia admit to [facility] ed 1/28 with concerns for CVA and hygroma's in head--neuro consulted, MRI brain consistent with acute CVA  Assessment & Plan:   Assessment:  The primary encounter diagnosis was Acute CVA (cerebrovascular accident) ([place]). A diagnosis of Left-sided chest wall pain was also pertinent to this visit.  Stroke-await neurology further input regarding eithe rAC vs anti--lt tx--suspect will be resuming coumading, get eval therapy eval etc Systolic hf EF-recent echo 35%-holding diuretic-euvolemic at presnt Chr systolic hf-cont digoxin 8.119 mcg-monitor on tele for now Hyponatremia-sodium 133. MVP-follow as OP with [name]   DVT prophylaxis: lovenox  Code Status:   full   Family Communication:   none  Disposition Plan: inpatient   Consultants:   neurology  Procedures:   nad  Antimicrobials:   none   Subjective:  Awake alert coughing at night Deficits resolved, no cp no sob  Objective: Vitals:   12/09/17 0529 12/09/17 0550 12/09/17 0756 12/09/17 0927  BP: (!) 90/55 (!) 96/56 (!) 110/57 109/60  Pulse: 82 74 82 90  Resp: 18 18  17   Temp: (!) 97.5 F (36.4 C)   (!) 97.5 F (36.4 C)  TempSrc: Oral   Oral  SpO2: 95%   98%  Weight:      Height:       No intake or output data in the 24 hours ending 12/09/17 1320 Filed Weights   12/08/17 1205  Weight: 79.4 kg (175 lb)    Examination: eomi ncat in nad No ict no pallor cta b, no added sound s1s 2 no m/r/g abd soft nt nd no rebound no guard   Data Reviewed: I have personally reviewed following labs and imaging studies  CBC: Recent Labs  Lab 12/08/17 1230 12/08/17 1253  WBC 8.9  --   NEUTROABS 5.7  --   HGB 13.5 13.9  HCT 40.3 41.0  MCV 93.9  --   PLT 190   --    Basic Metabolic Panel: Recent Labs  Lab 12/08/17 1230 12/08/17 1253  NA 133* 133*  K 4.1 4.2  CL 95* 95*  CO2 27  --   GLUCOSE 126* 122*  BUN 31* 35*  CREATININE 1.09 1.00  CALCIUM 8.7*  --    GFR: Estimated Creatinine Clearance: 54.8 mL/min (by C-G formula based on SCr of 1 mg/dL). Liver Function Tests: Recent Labs  Lab 12/08/17 1230  AST 22  ALT 17  ALKPHOS 53  BILITOT 1.0  PROT 6.9  ALBUMIN 3.0*   No results for input(s): LIPASE, AMYLASE in the last 168 hours. No results for input(s): AMMONIA in the last 168 hours. Coagulation Profile: Recent Labs  Lab 12/08/17 1230 12/09/17 0513  INR 1.74 1.66   Cardiac Enzymes: No results for input(s): CKTOTAL, CKMB, CKMBINDEX, TROPONINI in the last 168 hours. CBG: Recent Labs  Lab 12/08/17 1236  GLUCAP 116*   Urine analysis:    Component Value Date/Time   COLORURINE YELLOW 12/08/2017 [place] 12/08/2017 1327   LABSPEC 1.015 12/08/2017 1327   PHURINE 8.0 12/08/2017 1327   GLUCOSEU NEGATIVE 12/08/2017 1327   HGBUR NEGATIVE 12/08/2017 1327   BILIRUBINUR NEGATIVE 12/08/2017 [place] 12/08/2017 1327   PROTEINUR NEGATIVE 12/08/2017 1327   NITRITE NEGATIVE 12/08/2017 1327  LEUKOCYTESUR NEGATIVE 12/08/2017 1327     Radiology Studies: Reviewed images personally in health database    Scheduled Meds: . aspirin EC  325 mg Oral Daily  . atorvastatin  40 mg Oral q1800  . [START ON 12/10/2017] digoxin  0.125 mg Oral Daily  . pantoprazole  40 mg Oral Daily  . warfarin  3 mg Oral ONCE-1800  . Warfarin - Pharmacist Dosing Inpatient   Does not apply q1800   Continuous Infusions:   LOS: 0 days    Time spent: [place], MD [facility] ([facility]   If 7PM-7AM, please contact night-coverage [email or web address] Password [email or web address] 12/09/2017, 1:20 PM

## 2017-12-09 NOTE — Progress Notes (Signed)
  Echocardiogram 2D Echocardiogram has been performed.  Jennette Dubin 12/09/2017, 3:29 PM

## 2017-12-09 NOTE — Progress Notes (Signed)
[facility]  Patient Status: Active (receiving services up to time of hospitalization)  [facility] is providing the following services: RN and PT  If patient discharges after hours, please call [phone].   Ryan Savage 12/09/2017, 10:00 AM

## 2017-12-10 ENCOUNTER — Ambulatory Visit: Payer: Medicare Other | Admitting: Cardiology

## 2017-12-10 DIAGNOSIS — E440 Moderate protein-calorie malnutrition: Secondary | ICD-10-CM

## 2017-12-10 LAB — PROTIME-INR
INR: 1.79
Prothrombin Time: 20.7 seconds — ABNORMAL HIGH (ref 11.4–15.2)

## 2017-12-10 MED ORDER — ASPIRIN 325 MG PO TBEC: 325.0000 mg | DELAYED_RELEASE_TABLET | Freq: Every day | ORAL | 0 refills | Status: DC

## 2017-12-10 MED ORDER — PANTOPRAZOLE SODIUM 40 MG PO TBEC: 40.0000 mg | DELAYED_RELEASE_TABLET | Freq: Two times a day (BID) | ORAL | 0 refills | Status: DC

## 2017-12-10 NOTE — Discharge Summary (Signed)
Physician Discharge Summary  Ryan Savage TDD:[id number] DOB: July 01, 1929 DOA: 12/08/2017  PCP: Lajean Manes, MD  Admit date: 12/08/2017 Discharge date: 12/10/2017  Time spent: 25 minutes  Recommendations for Outpatient Follow-up:  1. Needs asa 325 until INR between 2-3 2. consider referral to ENT if cough persists-this admission increased protonix to bid and changed from Omeprazole 3. 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)   Cerebral embolism with cerebral infarction   Malnutrition of moderate degree   Discharge Condition: improved  Diet recommendation: hh low salt  Filed Weights   12/08/17 1205  Weight: 79.4 kg (175 lb)    History of present illness:  88 m systolic hf, chr afib [place] 5, mvp, htn, hyponatremia-siadh, subcl hypothyroid, hyperglycemia admit to [facility] ed 1/28 with concerns for CVA and hygroma's in head--neuro consulted, MRI brain consistent with acute CVA    Hospital Course:   [facility] neurology further input regarding eithe rAC vs anti--lt tx--suspect will be resuming coumading, get eval therapy eval etc--was okayed for home health on d/c home--echo and carotids unchanged from prior Systolic hf EF-recent echo 35%-holding diuretic-euvolemic at present-resumed lasix on d/c Afib [place] >3-on coumadin-was subtx this admit--bridged to INR of 2-3 with asa 325 daily in addition to coumadin Chr systolic hf-cont digoxin 6.237 mcg-monitor on tele for now Hyponatremia-sodium 133. MVP-follow as OP with [name]    Procedures:  Echo  carotid   mri   Consultations:   neuro  Discharge Exam: Vitals:   12/10/17 0034 12/10/17  0533  BP: 106/73 118/79  Pulse: 91 93  Resp: 18 18  Temp: 97.6 F (36.4 C) (!) 97.5 F (36.4 C)  SpO2: 96% 94%    General: awake alert in nad Cardiovascular: s1 s2 no m/r/g Respiratory: clear no added sound Neuro intact moves 4 limbs equally without deficit  Discharge Instructions   Discharge Instructions    Ambulatory referral to Neurology   Complete by:  As directed    An appointment is requested in approximately: 6 weeks Follow up with stroke clinic Cecille Rubin preferred, if not available, then consider Caesar Chestnut, [facility] or Jaynee Eagles whoever is available) at [facility] in about 6-8 weeks. Thanks.   Diet - low sodium heart healthy   Complete by:  As directed    Discharge instructions   Complete by:  As directed    Please get your coumadin level checked within the next 1 weeks--until then you need to take aspirin 325 mg and when your INR level is between 2-3 you can stop the Aspirin Please follow with your primary MD Continue the protonix medication 2 x a day and see how your cough imporves--follow with Dr. Felipa Eth going forward and discuss this with him   Increase activity slowly   Complete by:  As directed      Allergies as of 12/10/2017      Reactions   Novocain [procaine] Rash      Medication List    TAKE these medications   acetaminophen 500 MG tablet Commonly known as:  TYLENOL Take 1,000 mg by mouth every 6 (six) hours as needed for mild pain.   ALAVERT 10 MG tablet Generic  drug:  loratadine Take 10 mg by mouth daily.   aspirin 325 MG EC tablet Take 1 tablet (325 mg total) by mouth daily.   atorvastatin 20 MG tablet Commonly known as:  LIPITOR Take 1 tablet (20 mg total) by mouth daily at 6 PM.   carbamide peroxide 6.5 % OTIC solution Commonly known as:  DEBROX 10 drops as needed (ear wax).   digoxin 0.125 MG tablet Commonly known as:  LANOXIN Take 1 tablet (0.125 mg total) by mouth daily.   finasteride 5 MG tablet Commonly known as:   PROSCAR Take 5 mg by mouth daily.   fluticasone 50 MCG/ACT nasal spray Commonly known as:  FLONASE Place 1 spray into both nostrils daily.   furosemide 20 MG tablet Commonly known as:  LASIX Take 1 tablet (20 mg total) by mouth daily.   lactulose 10 GM/15ML solution Commonly known as:  CHRONULAC Take 15 mLs (10 g total) by mouth 2 (two) times daily as needed for mild constipation.   metoprolol succinate 25 MG 24 hr tablet Commonly known as:  TOPROL-XL Take 3 tablets (75 mg total) by mouth daily.   multivitamin capsule Take by mouth.   pantoprazole 40 MG tablet Commonly known as:  PROTONIX Take 1 tablet (40 mg total) by mouth 2 (two) times daily.   warfarin 3 MG tablet Commonly known as:  COUMADIN Take 0.5 tablets (1.5 mg total) by mouth daily at 6 PM. Ensure follow-up to Coumadin clinic on Monday for INR check What changed:  Another medication with the same name was removed. Continue taking this medication, and follow the directions you see here.      Allergies  Allergen Reactions  . Novocain [Procaine] Rash   Follow-up Information    Dennie Bible, NP. Schedule an appointment as soon as possible for a visit in 6 week(s).   Specialty:  Family Medicine Contact information: [address] [place] [address] [phone]            The results of significant diagnostics from this hospitalization (including imaging, microbiology, ancillary and laboratory) are listed below for reference.    Significant Diagnostic Studies: Ct Angio Head W Or Wo Contrast  Result Date: 12/08/2017 CLINICAL DATA:  Expressive aphasia and right-sided weakness. EXAM: CT ANGIOGRAPHY HEAD AND NECK TECHNIQUE: Multidetector CT imaging of the head and neck was performed using the standard protocol during bolus administration of intravenous contrast. Multiplanar CT image reconstructions and MIPs were obtained to evaluate the vascular anatomy. Carotid stenosis measurements  (when applicable) are obtained utilizing NASCET criteria, using the distal internal carotid diameter as the denominator. CONTRAST:  52mL ISOVUE-370 IOPAMIDOL (ISOVUE-370) INJECTION 76% COMPARISON:  Head CT today. Chest radiograph 11/18/2017. No prior angiographic imaging. FINDINGS: CTA NECK FINDINGS Aortic arch: Standard 3 vessel aortic arch with moderate volume calcified and soft plaque. Patent brachiocephalic and subclavian arteries with nonstenotic atherosclerotic plaque bilaterally. Right carotid system: Patent without evidence of stenosis or dissection. Left carotid system: Patent without evidence of stenosis or dissection. Mild calcified and soft plaque at the carotid bifurcation. Vertebral arteries: The vertebral arteries are patent with the left being slightly dominant. Predominantly calcified plaque at both vertebral artery origins results in likely mild stenosis on the left and at most minimal stenosis on the right. Skeleton: Moderate cervical disc degeneration. Other neck: No mass or lymph node enlargement. Upper chest: Biapical pleural-parenchymal scarring. Subpleural reticulation and peripheral ground-glass opacity in both upper lobes with widespread interstitial density throughout the lungs on  the prior chest radiograph. Review of the MIP images confirms the above findings CTA HEAD FINDINGS Anterior circulation: The internal carotid arteries are patent from skull base to carotid termini with mild calcified siphon atherosclerosis resulting in at most mild paraclinoid ICA stenosis bilaterally. ACAs and MCAs are patent without evidence of proximal branch occlusion or significant proximal stenosis. No aneurysm. Posterior circulation: The intracranial vertebral arteries are patent with left greater than right V4 segment atherosclerosis resulting in luminal irregularity but no significant stenosis. Patent PICA and SCA origins are identified bilaterally. The basilar artery is widely patent. Posterior  communicating arteries are diminutive or absent. PCAs are patent without evidence of significant proximal stenosis. No aneurysm. Venous sinuses: As permitted by contrast timing, patent. Anatomic variants: None. Delayed phase: Not performed. Review of the MIP images confirms the above findings IMPRESSION: 1. No emergent large vessel occlusion. 2. Mild intracranial atherosclerosis with mild bilateral ICA narrowing. 3. Widely patent cervical carotid arteries. 4. Mild left vertebral artery origin stenosis. 5.  Aortic Atherosclerosis (ICD10-I70.0). 6. Biapical parenchymal lung changes concerning for interstitial lung disease. Preliminary findings of no large vessel occlusion were communicated to Dr. Lorraine Lax at 1:29 pm on 12/08/2017 by text page via the [facility] messaging system. Electronically Signed   By: Logan Bores M.D.   On: 12/08/2017 13:44   Ct Angio Neck W Or Wo Contrast  Result Date: 12/08/2017 CLINICAL DATA:  Expressive aphasia and right-sided weakness. EXAM: CT ANGIOGRAPHY HEAD AND NECK TECHNIQUE: Multidetector CT imaging of the head and neck was performed using the standard protocol during bolus administration of intravenous contrast. Multiplanar CT image reconstructions and MIPs were obtained to evaluate the vascular anatomy. Carotid stenosis measurements (when applicable) are obtained utilizing NASCET criteria, using the distal internal carotid diameter as the denominator. CONTRAST:  84mL ISOVUE-370 IOPAMIDOL (ISOVUE-370) INJECTION 76% COMPARISON:  Head CT today. Chest radiograph 11/18/2017. No prior angiographic imaging. FINDINGS: CTA NECK FINDINGS Aortic arch: Standard 3 vessel aortic arch with moderate volume calcified and soft plaque. Patent brachiocephalic and subclavian arteries with nonstenotic atherosclerotic plaque bilaterally. Right carotid system: Patent without evidence of stenosis or dissection. Left carotid system: Patent without evidence of stenosis or dissection. Mild calcified and soft plaque  at the carotid bifurcation. Vertebral arteries: The vertebral arteries are patent with the left being slightly dominant. Predominantly calcified plaque at both vertebral artery origins results in likely mild stenosis on the left and at most minimal stenosis on the right. Skeleton: Moderate cervical disc degeneration. Other neck: No mass or lymph node enlargement. Upper chest: Biapical pleural-parenchymal scarring. Subpleural reticulation and peripheral ground-glass opacity in both upper lobes with widespread interstitial density throughout the lungs on the prior chest radiograph. Review of the MIP images confirms the above findings CTA HEAD FINDINGS Anterior circulation: The internal carotid arteries are patent from skull base to carotid termini with mild calcified siphon atherosclerosis resulting in at most mild paraclinoid ICA stenosis bilaterally. ACAs and MCAs are patent without evidence of proximal branch occlusion or significant proximal stenosis. No aneurysm. Posterior circulation: The intracranial vertebral arteries are patent with left greater than right V4 segment atherosclerosis resulting in luminal irregularity but no significant stenosis. Patent PICA and SCA origins are identified bilaterally. The basilar artery is widely patent. Posterior communicating arteries are diminutive or absent. PCAs are patent without evidence of significant proximal stenosis. No aneurysm. Venous sinuses: As permitted by contrast timing, patent. Anatomic variants: None. Delayed phase: Not performed. Review of the MIP images confirms the above findings IMPRESSION: 1. No  emergent large vessel occlusion. 2. Mild intracranial atherosclerosis with mild bilateral ICA narrowing. 3. Widely patent cervical carotid arteries. 4. Mild left vertebral artery origin stenosis. 5.  Aortic Atherosclerosis (ICD10-I70.0). 6. Biapical parenchymal lung changes concerning for interstitial lung disease. Preliminary findings of no large vessel  occlusion were communicated to Dr. Lorraine Lax at 1:29 pm on 12/08/2017 by text page via the [facility] messaging system. Electronically Signed   By: Logan Bores M.D.   On: 12/08/2017 13:44   Mr Brain Wo Contrast  Result Date: 12/08/2017 CLINICAL DATA:  Altered mental status. Last known well earlier today. Atrial fibrillation. RIGHT arm weakness. EXAM: MRI HEAD WITHOUT CONTRAST TECHNIQUE: Multiplanar, multiecho pulse sequences of the brain and surrounding structures were obtained without intravenous contrast. COMPARISON:  CT head and CTA head neck earlier today. FINDINGS: Brain: Focal area of linear restricted diffusion, LEFT posterior frontal periventricular white matter, MCA territory, corresponding low ADC, consistent with acute infarction. Similar punctate area restricted diffusion LEFT posterior frontal parasagittal cortex, ACA vascular territory, consistent with multiple emboli. No findings on the RIGHT, or in the posterior fossa. These areas of acute infarction cannot be seen on prior CT imaging. Generalized atrophy. BILATERAL subdural hygromas, without prolongation of FLAIR signal or chronic blood products, measure 5-6 mm bilaterally, non worrisome. No hydrocephalus, hemorrhage, or intra-axial mass lesion. Mild small vessel disease. Vascular: Flow voids are maintained. Skull and upper cervical spine: Normal marrow signal. Cervical spondylosis. Sinuses/Orbits: Negative. Other: None. IMPRESSION: Acute LEFT MCA periventricular deep white matter infarct could account for RIGHT arm weakness. Smaller punctate area of acute infarction in the LEFT posterior frontal parasagittal cortex could affect the RIGHT leg. Both infarcts likely reflect sequelae of emboli from atrial fibrillation. BILATERAL subdural hygromas, without signs of chronic blood products. Atrophy with mild small vessel disease. Electronically Signed   By: Staci Righter M.D.   On: 12/08/2017 19:09   Dg Chest Portable 1 View  Result Date:  11/18/2017 CLINICAL DATA:  Cough.  Shortness of breath. EXAM: PORTABLE CHEST 1 VIEW COMPARISON:  CT 02/15/2010. FINDINGS: Cardiomegaly. Diffuse bilateral interstitial prominence. Changes consistent with interstitial edema and/or pneumonitis. No pleural effusion or pneumothorax. No acute bony abnormality. IMPRESSION: Cardiomegaly with diffuse bilateral pulmonary interstitial prominence consistent with interstitial edema. Pneumonitis cannot be excluded. Electronically Signed   By: Marcello Moores  Register   On: 11/18/2017 07:14   Ct Head Code Stroke Wo Contrast  Result Date: 12/08/2017 CLINICAL DATA:  Code stroke. 84 year old male with expressive aphasia and right side weakness. Initial encounter. EXAM: CT HEAD WITHOUT CONTRAST TECHNIQUE: Contiguous axial images were obtained from the base of the skull through the vertex without intravenous contrast. COMPARISON:  None. FINDINGS: Brain: Bilateral low-density subdural collections, each measuring 5-6 millimeters in thickness. See coronal image 26. No hyperdense intracranial hemorrhage identified. No midline shift. Basilar cisterns remain normal. Normal for age bilateral gray-white matter differentiation. No cortically based acute infarct identified. No cortical encephalomalacia identified. No ventriculomegaly. Vascular: Calcified atherosclerosis at the skull base. No suspicious intracranial vascular hyperdensity. Skull: Intact.  No acute osseous abnormality identified. Sinuses/Orbits: Clear. Other: Visualized orbit soft tissues are within normal limits. Visualized scalp soft tissues are within normal limits. ASPECTS [facility] Stroke Program Early CT Score) - Ganglionic level infarction (caudate, lentiform nuclei, internal capsule, insula, M1-M3 cortex): 7 - Supraganglionic infarction (M4-M6 cortex): 3 Total score (0-10 with 10 being normal): 10 IMPRESSION: 1. Positive for bilateral subdural hygromas or very low-density subdural hematomas, each 5-6 mm in thickness. No midline  shift. Patent basilar cisterns.  2. No superimposed acute cortically based infarct or hyperdense intracranial hemorrhage identified. 3. ASPECTS is 10. 4. These results were communicated to Dr. Lorraine Lax at 1:06 pmon 1/28/2019by text page via the [facility] messaging system. Electronically Signed   By: Genevie Ann M.D.   On: 12/08/2017 13:06    Microbiology: No results found for this or any previous visit (from the past 240 hour(s)).   Labs: Basic Metabolic Panel: Recent Labs  Lab 12/08/17 1230 12/08/17 1253  NA 133* 133*  K 4.1 4.2  CL 95* 95*  CO2 27  --   GLUCOSE 126* 122*  BUN 31* 35*  CREATININE 1.09 1.00  CALCIUM 8.7*  --    Liver Function Tests: Recent Labs  Lab 12/08/17 1230  AST 22  ALT 17  ALKPHOS 53  BILITOT 1.0  PROT 6.9  ALBUMIN 3.0*   No results for input(s): LIPASE, AMYLASE in the last 168 hours. No results for input(s): AMMONIA in the last 168 hours. CBC: Recent Labs  Lab 12/08/17 1230 12/08/17 1253  WBC 8.9  --   NEUTROABS 5.7  --   HGB 13.5 13.9  HCT 40.3 41.0  MCV 93.9  --   PLT 190  --    Cardiac Enzymes: No results for input(s): CKTOTAL, CKMB, CKMBINDEX, TROPONINI in the last 168 hours. BNP: BNP (last 3 results) Recent Labs    11/18/17 0855  BNP 1,421.3*    ProBNP (last 3 results) No results for input(s): PROBNP in the last 8760 hours.  CBG: Recent Labs  Lab 12/08/17 1236  GLUCAP 116*       Signed:  Nita Sells MD   [facility] 12/10/2017, 8:20 AM

## 2017-12-10 NOTE — Progress Notes (Signed)
Speech Pathology:  New orders received for swallowing evaluation.  Pt in process of being D/Cd home.  RN, pt state swallowing is improving and pt states assessment is not necessary.  Shawnia Vizcarrondo L. Tivis Ringer, [place] CCC/SLP Pager [phone]

## 2017-12-10 NOTE — Care Management Note (Signed)
Case Management Note  Patient Details  Name: Ryan Savage MRN: [id number] Date of Birth: 1929-11-05  Subjective/Objective:                    Action/Plan: Pt discharging home with resumption of [place] services. CM met with the patient and he was active with [facility] prior to admission. Pt states he would like to continue with [facility]. Butch Penny with [facility] notified and accepted the resumption.  Pt with recommendation for 3 in 1. Pt states he has one at home. Pt states he has transportation home.   Expected Discharge Date:  12/10/17               Expected Discharge Plan:  [place]  In-House Referral:     Discharge planning Services  CM Consult  Post Acute Care Choice:  Home Health, Resumption of Svcs/PTA Provider Choice offered to:  Patient  DME Arranged:    DME Agency:     HH Arranged:  RN, PT, Nurse's Aide [place] Agency:  [place]  Status of Service:  Completed, signed off  If discussed at [place] of Stay Meetings, dates discussed:    Additional Comments:  Pollie Friar, RN 12/10/2017, 11:01 AM

## 2017-12-10 NOTE — Progress Notes (Signed)
Pt given discharge instructions. Taken down via wheelchair by volunteer services.

## 2017-12-12 DIAGNOSIS — I639 Cerebral infarction, unspecified: Secondary | ICD-10-CM

## 2017-12-12 HISTORY — DX: Cerebral infarction, unspecified: I63.9

## 2017-12-17 ENCOUNTER — Other Ambulatory Visit: Payer: Self-pay

## 2017-12-17 NOTE — Patient Outreach (Signed)
[facility]) Care Management  12/17/2017  Ryan Savage 1929/09/17 [id number]     EMMI- Stroke RED ON EMMI ALERT Day # 6 Date: 12/16/17 Red Alert Reason: "Feeling worse overall? Yes   New or worsening pain/fever/SOB? Yes"   Outreach attempt # 1 to patient. Spoke with patient and reviewed and addressed red alerts. He voices that he responded that way not related to anything going on with a stroke. He reports that he has been having difficulty sleeping for over 16 years due to sleep apnea and has trouble sleeping. He was sent home with "wedges" to start using at night. He voices that he is trying to get adjusted to using them. He does report that he feels likes his sleeping has gotten better since being out of the hospital. He has [facility] services in place. He reports that nurse is arranging his MD f/u appts. His son will transport him to appts. Patient reminded of transportation benefit thru [facility] Medicare if ever needed. He voices he has all his meds and denies any questions or concerns regarding them. He has no RN CM needs or concerns at this time. Advised patient that they would continue to get automated EMMI-Stroke post discharge calls to assess how they are doing following recent hospitalization and will receive a call from a nurse if any of their responses were abnormal. Patient voiced understanding and was appreciative of f/u call.        Plan: RN CM will notify [facility] administrative assistant of case status.   Enzo Montgomery, RN,BSN,CCM [place] [facility] Telephonic Care Management Coordinator Direct Phone: [phone] Toll Free: [phone] Fax: [phone]

## 2018-01-09 DIAGNOSIS — S065X9A Traumatic subdural hemorrhage with loss of consciousness of unspecified duration, initial encounter: Secondary | ICD-10-CM

## 2018-01-09 DIAGNOSIS — S065XAA Traumatic subdural hemorrhage with loss of consciousness status unknown, initial encounter: Secondary | ICD-10-CM

## 2018-01-09 HISTORY — DX: Traumatic subdural hemorrhage with loss of consciousness status unknown, initial encounter: S06.5XAA

## 2018-01-09 HISTORY — DX: Traumatic subdural hemorrhage with loss of consciousness of unspecified duration, initial encounter: S06.5X9A

## 2018-01-23 ENCOUNTER — Inpatient Hospital Stay (HOSPITAL_COMMUNITY)
Admission: EM | Admit: 2018-01-23 | Discharge: 2018-01-29 | DRG: 025 | Disposition: A | Discharge status: Home / Self Care | Payer: Medicare Other | Attending: Neurosurgery | Admitting: Neurosurgery

## 2018-01-23 ENCOUNTER — Emergency Department (HOSPITAL_COMMUNITY): Payer: Medicare Other

## 2018-01-23 ENCOUNTER — Other Ambulatory Visit: Payer: Self-pay

## 2018-01-23 ENCOUNTER — Encounter (HOSPITAL_COMMUNITY): Payer: Self-pay

## 2018-01-23 DIAGNOSIS — R739 Hyperglycemia, unspecified: Secondary | ICD-10-CM | POA: Diagnosis present

## 2018-01-23 DIAGNOSIS — S065XAA Traumatic subdural hemorrhage with loss of consciousness status unknown, initial encounter: Secondary | ICD-10-CM | POA: Diagnosis present

## 2018-01-23 DIAGNOSIS — E222 Syndrome of inappropriate secretion of antidiuretic hormone: Secondary | ICD-10-CM | POA: Diagnosis present

## 2018-01-23 DIAGNOSIS — D689 Coagulation defect, unspecified: Secondary | ICD-10-CM | POA: Diagnosis present

## 2018-01-23 DIAGNOSIS — I110 Hypertensive heart disease with heart failure: Secondary | ICD-10-CM | POA: Diagnosis present

## 2018-01-23 DIAGNOSIS — I10 Essential (primary) hypertension: Secondary | ICD-10-CM | POA: Diagnosis present

## 2018-01-23 DIAGNOSIS — E0590 Thyrotoxicosis, unspecified without thyrotoxic crisis or storm: Secondary | ICD-10-CM | POA: Diagnosis present

## 2018-01-23 DIAGNOSIS — I482 Chronic atrial fibrillation: Secondary | ICD-10-CM | POA: Diagnosis present

## 2018-01-23 DIAGNOSIS — Z8673 Personal history of transient ischemic attack (TIA), and cerebral infarction without residual deficits: Secondary | ICD-10-CM

## 2018-01-23 DIAGNOSIS — I6340 Cerebral infarction due to embolism of unspecified cerebral artery: Secondary | ICD-10-CM | POA: Diagnosis present

## 2018-01-23 DIAGNOSIS — Z8249 Family history of ischemic heart disease and other diseases of the circulatory system: Secondary | ICD-10-CM | POA: Diagnosis not present

## 2018-01-23 DIAGNOSIS — E785 Hyperlipidemia, unspecified: Secondary | ICD-10-CM | POA: Diagnosis present

## 2018-01-23 DIAGNOSIS — Z7901 Long term (current) use of anticoagulants: Secondary | ICD-10-CM

## 2018-01-23 DIAGNOSIS — G936 Cerebral edema: Secondary | ICD-10-CM | POA: Diagnosis present

## 2018-01-23 DIAGNOSIS — Z85828 Personal history of other malignant neoplasm of skin: Secondary | ICD-10-CM

## 2018-01-23 DIAGNOSIS — S065X9A Traumatic subdural hemorrhage with loss of consciousness of unspecified duration, initial encounter: Secondary | ICD-10-CM | POA: Diagnosis present

## 2018-01-23 DIAGNOSIS — N400 Enlarged prostate without lower urinary tract symptoms: Secondary | ICD-10-CM | POA: Diagnosis present

## 2018-01-23 DIAGNOSIS — Z9089 Acquired absence of other organs: Secondary | ICD-10-CM

## 2018-01-23 DIAGNOSIS — K219 Gastro-esophageal reflux disease without esophagitis: Secondary | ICD-10-CM | POA: Diagnosis present

## 2018-01-23 DIAGNOSIS — Z7982 Long term (current) use of aspirin: Secondary | ICD-10-CM

## 2018-01-23 DIAGNOSIS — E440 Moderate protein-calorie malnutrition: Secondary | ICD-10-CM | POA: Diagnosis present

## 2018-01-23 DIAGNOSIS — Z800 Family history of malignant neoplasm of digestive organs: Secondary | ICD-10-CM | POA: Diagnosis not present

## 2018-01-23 DIAGNOSIS — I5022 Chronic systolic (congestive) heart failure: Secondary | ICD-10-CM | POA: Diagnosis present

## 2018-01-23 DIAGNOSIS — G4733 Obstructive sleep apnea (adult) (pediatric): Secondary | ICD-10-CM | POA: Diagnosis present

## 2018-01-23 DIAGNOSIS — I341 Nonrheumatic mitral (valve) prolapse: Secondary | ICD-10-CM | POA: Diagnosis present

## 2018-01-23 DIAGNOSIS — D181 Lymphangioma, any site: Secondary | ICD-10-CM | POA: Diagnosis present

## 2018-01-23 DIAGNOSIS — W010XXA Fall on same level from slipping, tripping and stumbling without subsequent striking against object, initial encounter: Secondary | ICD-10-CM | POA: Diagnosis present

## 2018-01-23 DIAGNOSIS — Z87891 Personal history of nicotine dependence: Secondary | ICD-10-CM

## 2018-01-23 DIAGNOSIS — R4701 Aphasia: Secondary | ICD-10-CM | POA: Diagnosis present

## 2018-01-23 DIAGNOSIS — I340 Nonrheumatic mitral (valve) insufficiency: Secondary | ICD-10-CM | POA: Diagnosis present

## 2018-01-23 DIAGNOSIS — M1990 Unspecified osteoarthritis, unspecified site: Secondary | ICD-10-CM | POA: Diagnosis present

## 2018-01-23 DIAGNOSIS — Z888 Allergy status to other drugs, medicaments and biological substances status: Secondary | ICD-10-CM

## 2018-01-23 DIAGNOSIS — I4821 Permanent atrial fibrillation: Secondary | ICD-10-CM | POA: Diagnosis present

## 2018-01-23 LAB — COMPREHENSIVE METABOLIC PANEL
ALT: 13 U/L — ABNORMAL LOW (ref 17–63)
AST: 23 U/L (ref 15–41)
Albumin: 3 g/dL — ABNORMAL LOW (ref 3.5–5.0)
Alkaline Phosphatase: 56 U/L (ref 38–126)
Anion gap: 8 (ref 5–15)
BUN: 11 mg/dL (ref 6–20)
CO2: 25 mmol/L (ref 22–32)
Calcium: 8.7 mg/dL — ABNORMAL LOW (ref 8.9–10.3)
Chloride: 96 mmol/L — ABNORMAL LOW (ref 101–111)
Creatinine, Ser: 0.81 mg/dL (ref 0.61–1.24)
GFR calc Af Amer: >60 mL/min (ref >60)
GFR calc non Af Amer: >60 mL/min (ref >60)
Glucose, Bld: 149 mg/dL — ABNORMAL HIGH (ref 65–99)
Potassium: 3.9 mmol/L (ref 3.5–5.1)
Sodium: 129 mmol/L — ABNORMAL LOW (ref 135–145)
Total Bilirubin: 0.7 mg/dL (ref 0.3–1.2)
Total Protein: 6.7 g/dL (ref 6.5–8.1)

## 2018-01-23 LAB — URINALYSIS, ROUTINE W REFLEX MICROSCOPIC
Bacteria, UA: NONE SEEN
Bilirubin Urine: NEGATIVE
Glucose, UA: NEGATIVE mg/dL
Ketones, ur: NEGATIVE mg/dL
Leukocytes, UA: NEGATIVE
Nitrite: NEGATIVE
Protein, ur: NEGATIVE mg/dL
Specific Gravity, Urine: 1.009 (ref 1.005–1.030)
pH: 7 (ref 5.0–8.0)

## 2018-01-23 LAB — CBC WITH DIFFERENTIAL/PLATELET
Basophils Absolute: 0 10*3/uL (ref 0.0–0.1)
Basophils Relative: 1 %
Eosinophils Absolute: 0 10*3/uL (ref 0.0–0.7)
Eosinophils Relative: 0 %
HCT: 35.1 % — ABNORMAL LOW (ref 39.0–52.0)
Hemoglobin: 11.8 g/dL — ABNORMAL LOW (ref 13.0–17.0)
Lymphocytes Relative: 27 %
Lymphs Abs: 1.6 10*3/uL (ref 0.7–4.0)
MCH: 31.1 pg (ref 26.0–34.0)
MCHC: 33.6 g/dL (ref 30.0–36.0)
MCV: 92.4 fL (ref 78.0–100.0)
Monocytes Absolute: 0.5 10*3/uL (ref 0.1–1.0)
Monocytes Relative: 8 %
Neutro Abs: 3.7 10*3/uL (ref 1.7–7.7)
Neutrophils Relative %: 64 %
Platelets: 160 10*3/uL (ref 150–400)
RBC: 3.8 MIL/uL — ABNORMAL LOW (ref 4.22–5.81)
RDW: 14.3 % (ref 11.5–15.5)
WBC: 5.9 10*3/uL (ref 4.0–10.5)

## 2018-01-23 LAB — PROTIME-INR
INR: 2.47
INR: 1.24
INR: 1.36
Prothrombin Time: 26.5 seconds — ABNORMAL HIGH (ref 11.4–15.2)
Prothrombin Time: 15.5 seconds — ABNORMAL HIGH (ref 11.4–15.2)
Prothrombin Time: 16.6 seconds — ABNORMAL HIGH (ref 11.4–15.2)

## 2018-01-23 LAB — SURGICAL PCR SCREEN
MRSA, PCR: NEGATIVE
Staphylococcus aureus: NEGATIVE

## 2018-01-23 LAB — I-STAT TROPONIN, ED: Troponin i, poc: 0 ng/mL (ref 0.00–0.08)

## 2018-01-23 MED ORDER — PROTHROMBIN COMPLEX CONC HUMAN 500 UNITS IV KIT: 25.0000 [IU]/kg | PACK | Freq: Once | Status: DC

## 2018-01-23 MED ORDER — HYDROCODONE-ACETAMINOPHEN 5-325 MG PO TABS: 1.0000 | ORAL_TABLET | ORAL | Status: DC | PRN

## 2018-01-23 MED ORDER — ACETAMINOPHEN 325 MG PO TABS
650.0000 mg | ORAL_TABLET | Freq: Four times a day (QID) | ORAL | Status: DC | PRN
  Administered 2018-01-25 – 2018-01-29 (×7): 650 mg via ORAL
  Filled 2018-01-23 (×7): qty 2

## 2018-01-23 MED ORDER — BISACODYL 10 MG RE SUPP
10.0000 mg | Freq: Every day | RECTAL | Status: DC | PRN
  Administered 2018-01-28: 10 mg via RECTAL
  Filled 2018-01-23 (×2): qty 1

## 2018-01-23 MED ORDER — FINASTERIDE 5 MG PO TABS
5.0000 mg | ORAL_TABLET | Freq: Every day | ORAL | Status: DC
  Administered 2018-01-24 – 2018-01-29 (×6): 5 mg via ORAL
  Filled 2018-01-23 (×6): qty 1

## 2018-01-23 MED ORDER — SENNOSIDES-DOCUSATE SODIUM 8.6-50 MG PO TABS: 1.0000 | ORAL_TABLET | Freq: Every evening | ORAL | Status: DC | PRN

## 2018-01-23 MED ORDER — VITAMIN K1 10 MG/ML IJ SOLN
10.0000 mg | Freq: Once | INTRAVENOUS | Status: AC
  Administered 2018-01-23: 10 mg via INTRAVENOUS
  Filled 2018-01-23: qty 1

## 2018-01-23 MED ORDER — ACETAMINOPHEN 650 MG RE SUPP: 650.0000 mg | Freq: Four times a day (QID) | RECTAL | Status: DC | PRN

## 2018-01-23 MED ORDER — METOPROLOL SUCCINATE ER 25 MG PO TB24
25.0000 mg | ORAL_TABLET | Freq: Two times a day (BID) | ORAL | Status: DC
  Administered 2018-01-23 – 2018-01-29 (×12): 25 mg via ORAL
  Filled 2018-01-23 (×12): qty 1

## 2018-01-23 MED ORDER — PANTOPRAZOLE SODIUM 40 MG PO TBEC
40.0000 mg | DELAYED_RELEASE_TABLET | Freq: Two times a day (BID) | ORAL | Status: DC
  Administered 2018-01-23: 40 mg via ORAL
  Filled 2018-01-23: qty 1

## 2018-01-23 MED ORDER — ONDANSETRON HCL 4 MG/2ML IJ SOLN
4.0000 mg | Freq: Four times a day (QID) | INTRAMUSCULAR | Status: DC | PRN
  Administered 2018-01-25: 4 mg via INTRAVENOUS

## 2018-01-23 MED ORDER — SODIUM CHLORIDE 0.9 % IV SOLN
INTRAVENOUS | Status: DC
  Administered 2018-01-23: 18:00:00 via INTRAVENOUS

## 2018-01-23 MED ORDER — EMPTY CONTAINERS FLEXIBLE MISC
1562.0000 [IU] | Freq: Once | Status: AC
  Administered 2018-01-23: 1562 [IU] via INTRAVENOUS
  Filled 2018-01-23: qty 1562

## 2018-01-23 MED ORDER — DIGOXIN 125 MCG PO TABS
0.1250 mg | ORAL_TABLET | Freq: Every day | ORAL | Status: DC
  Administered 2018-01-24 – 2018-01-29 (×6): 0.125 mg via ORAL
  Filled 2018-01-23 (×6): qty 1

## 2018-01-23 MED ORDER — ONDANSETRON HCL 4 MG PO TABS: 4.0000 mg | ORAL_TABLET | Freq: Four times a day (QID) | ORAL | Status: DC | PRN

## 2018-01-23 MED ORDER — FUROSEMIDE 20 MG PO TABS
20.0000 mg | ORAL_TABLET | Freq: Every day | ORAL | Status: DC
  Administered 2018-01-24 – 2018-01-29 (×6): 20 mg via ORAL
  Filled 2018-01-23 (×6): qty 1

## 2018-01-23 NOTE — ED Notes (Signed)
Neuro states that pt can eat, pt given [place] sandwich.

## 2018-01-23 NOTE — ED Notes (Signed)
ED Provider at bedside. 

## 2018-01-23 NOTE — Progress Notes (Signed)
Pt admitted from ED with diagnosis of SDH, pt alert and oriented, denies any pain at this time, pt settled in bed with call light and family at bedside, tele monitor set up and verified, v/s stable, will however continue to monitor. [place], Alex Leahy Efe

## 2018-01-23 NOTE — ED Notes (Signed)
Patient transported to CT 

## 2018-01-23 NOTE — ED Notes (Signed)
Neurosurgery at bedside speaking with pt.

## 2018-01-23 NOTE — ED Provider Notes (Addendum)
[facility] EMERGENCY DEPARTMENT Provider Note   CSN: [id number] Arrival date & time: 01/23/18  1209     History   Chief Complaint Chief Complaint  Patient presents with  . Weakness    HPI Ryan Savage is a 84 y.o. male.  Chief complaint is "my left leg is weak".  HPI: Patient presents for evaluation of left leg weakness.  Past medical history significant for A. fib, anticoagulated with Coumadin.  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  He presented with a aphasia.  Was able to be discharged back to home.  His CT/MRI showed small left parietal infarcts and bilateral 5-7 mm hygromas.  He was discharged.  He was placed on Coumadin and 324 aspirin until his INR reached 2.5.  He has been off of aspirin for greater than 3 weeks.  He states that he went for a walk 2 nights ago and did well.  Yesterday, Thursday he noticed that it was difficult to walk back from his bar into his house because his left leg was weak.  At one point he had tried to sit on a step and fell.  He did not strike his head.  A neighbor helped him up and he was able to get into the house.  They went to an event last night.  He was able to go but had to use his walker because of left leg weakness.  This persisted this morning.  He presents here via EMS.  Past Medical History:  Diagnosis Date  . Atrial fibrillation, chronic (HCC)    Chronic..coumadin clinic -Dr Radford Pax  . BPH (benign prostatic hypertrophy)   . CHF (congestive heart failure) ([place])   . [name] (degenerative joint disease)   . Hyperlipidemia    LDL goal less then 100  . Hypertension   . Microscopic hematuria    [name]  . Mitral valve prolapse    with moderate MR  . OSA (obstructive sleep apnea)    intolerant with CPAP (11/19/2017)  . SIADH (syndrome of inappropriate ADH production) ([place])   . Skin cancer    "side of my nose" (11/19/2017)  . Thrombocytopenia (HCC)    Mild- platelet count 143,000 on 02/2011, stable  08/2011    Patient Active Problem List   Diagnosis Date Noted  . Malnutrition of moderate degree 12/10/2017  . Cerebral embolism with cerebral infarction 12/09/2017  . Odynophagia 12/08/2017  . Syncope and collapse 12/08/2017  . Hyperlipidemia 12/08/2017  . OSA (obstructive sleep apnea) 12/08/2017  . Hypertension 12/08/2017  . Dysarthria 12/08/2017  . [name] (coronary artery disease) 12/08/2017  . Chronic systolic heart failure ([place]) 12/08/2017  . Chronic atrial fibrillation ([place]) 12/08/2017  . Syndrome of inappropriate ADH (SIADH) secretion ([place]) 12/08/2017  . Acute systolic congestive heart failure ([place])   . Atrial fibrillation, chronic ([place])   . SIADH (syndrome of inappropriate ADH production) ([place])   . Subclinical hyperthyroidism   . Acute on chronic systolic heart failure ([place]) 11/20/2017  . Acute exacerbation of CHF (congestive heart failure) ([place]) 11/18/2017  . Hyponatremia with excess extracellular fluid volume 11/18/2017  . Hyperglycemia 11/18/2017  . Supratherapeutic INR 11/18/2017  . Mitral valve prolapse   . Permanent atrial fibrillation ([place]) 11/08/2013  . Benign essential HTN 11/08/2013  . Mitral regurgitation 11/08/2013    Past Surgical History:  Procedure Laterality Date  . APPENDECTOMY  1950  . LUMBAR DISC SURGERY  1959   ruptured disc repair  . RIGHT/LEFT HEART CATH AND  CORONARY ANGIOGRAPHY N/A 11/21/2017   Procedure: RIGHT/LEFT HEART CATH AND CORONARY ANGIOGRAPHY;  Surgeon: Sherren Mocha, MD;  Location: [facility];  Service: Cardiovascular;  Laterality: N/A;  . SKIN CANCER EXCISION Left    "side of my nose"       Home Medications    Prior to Admission medications   Medication Sig Start Date End Date Taking? Authorizing Provider  acetaminophen (TYLENOL) 500 MG tablet Take 1,000 mg by mouth every 6 (six) hours as needed for mild pain.     [provider]  aspirin 325 MG EC tablet Take 1 tablet (325 mg total) by mouth daily. 12/10/17    Nita Sells, MD  atorvastatin (LIPITOR) 20 MG tablet Take 1 tablet (20 mg total) by mouth daily at 6 PM. 11/27/17   Allie Bossier, MD  carbamide peroxide (DEBROX) 6.5 % OTIC solution 10 drops as needed (ear wax).    [provider]  digoxin (LANOXIN) 0.125 MG tablet Take 1 tablet (0.125 mg total) by mouth daily. 11/28/17   Allie Bossier, MD  finasteride (PROSCAR) 5 MG tablet Take 5 mg by mouth daily.  09/06/13   [provider]  fluticasone (FLONASE) 50 MCG/ACT nasal spray Place 1 spray into both nostrils daily.    [provider]  furosemide (LASIX) 20 MG tablet Take 1 tablet (20 mg total) by mouth daily. 11/28/17   Allie Bossier, MD  lactulose (CHRONULAC) 10 GM/15ML solution Take 15 mLs (10 g total) by mouth 2 (two) times daily as needed for mild constipation. 11/27/17   Allie Bossier, MD  loratadine (ALAVERT) 10 MG tablet Take 10 mg by mouth daily.    [provider]  metoprolol succinate (TOPROL-XL) 25 MG 24 hr tablet Take 3 tablets (75 mg total) by mouth daily. 11/28/17   Allie Bossier, MD  Multiple Vitamin (MULTIVITAMIN) capsule Take by mouth.    [provider]  pantoprazole (PROTONIX) 40 MG tablet Take 1 tablet (40 mg total) by mouth 2 (two) times daily. 12/10/17   Nita Sells, MD  warfarin (COUMADIN) 3 MG tablet Take 0.5 tablets (1.5 mg total) by mouth daily at 6 PM. Ensure follow-up to Coumadin clinic on Monday for INR check Patient not taking: Reported on 12/08/2017 11/27/17   Allie Bossier, MD    Family History Family History  Problem Relation Age of Onset  . Heart failure Mother   . Heart attack Father   . [name] Father   . Heart attack Brother   . [name] Brother   . Colon cancer Brother   . Colon cancer Sister     Social History Social History   Tobacco Use  . Smoking status: Former Smoker    Years: 3.00    Types: Cigarettes    Last attempt to quit: 1958    Years since quitting: 61.2  . Smokeless tobacco:  Never Used  Substance Use Topics  . Alcohol use: No  . Drug use: No     Allergies   Novocain [procaine]   Review of Systems Review of Systems  Constitutional: Negative for appetite change, chills, diaphoresis, fatigue and fever.  HENT: Negative for mouth sores, sore throat and trouble swallowing.   Eyes: Negative for visual disturbance.  Respiratory: Negative for cough, chest tightness, shortness of breath and wheezing.   Cardiovascular: Negative for chest pain.  Gastrointestinal: Negative for abdominal distention, abdominal pain, diarrhea, nausea and vomiting.  Endocrine: Negative for polydipsia, polyphagia and polyuria.  Genitourinary: Negative for dysuria, frequency and hematuria.  Musculoskeletal: Negative for gait problem.  Skin: Negative for color change, pallor and rash.  Neurological: Positive for weakness. Negative for dizziness, syncope, light-headedness and headaches.  Hematological: Does not bruise/bleed easily.  Psychiatric/Behavioral: Negative for behavioral problems and confusion.     Physical Exam Updated Vital Signs BP 113/69   Pulse 72   Temp 97.9 F (36.6 C) (Oral)   Resp 19   Ht 5\' 10"  (1.778 m)   Wt 75.8 kg (167 lb)   SpO2 99%   BMI 23.96 kg/m   Physical Exam  Constitutional: He is oriented to person, place, and time. He appears well-developed and well-nourished. No distress.  HENT:  Head: Normocephalic.  Eyes: Conjunctivae are normal. Pupils are equal, round, and reactive to light. No scleral icterus.  Neck: Normal range of motion. Neck supple. No thyromegaly present.  Cardiovascular: Normal rate and regular rhythm. Exam reveals no gallop and no friction rub.  No murmur heard. Pulmonary/Chest: Effort normal and breath sounds normal. No respiratory distress. He has no wheezes. He has no rales.  Abdominal: Soft. Bowel sounds are normal. He exhibits no distension. There is no tenderness. There is no rebound.  Musculoskeletal: Normal range of  motion.  Neurological: He is alert and oriented to person, place, and time.  Cranial nerves.  Awake and alert.  Conversant.  No pronator drift.  No leg drift.  No obvious strength deficits of his lower extremities.  Skin: Skin is warm and dry. No rash noted.  Psychiatric: He has a normal mood and affect. His behavior is normal.     ED Treatments / Results  Labs (all labs ordered are listed, but only abnormal results are displayed) Labs Reviewed  CBC WITH DIFFERENTIAL/PLATELET - Abnormal; Notable for the following components:      Result Value   RBC 3.80 (*)    Hemoglobin 11.8 (*)    HCT 35.1 (*)    All other components within normal limits  COMPREHENSIVE METABOLIC PANEL - Abnormal; Notable for the following components:   Sodium 129 (*)    Chloride 96 (*)    Glucose, Bld 149 (*)    Calcium 8.7 (*)    Albumin 3.0 (*)    ALT 13 (*)    All other components within normal limits  URINALYSIS, ROUTINE W REFLEX MICROSCOPIC - Abnormal; Notable for the following components:   Hgb urine dipstick SMALL (*)    Squamous Epithelial / LPF 0-5 (*)    All other components within normal limits  PROTIME-INR - Abnormal; Notable for the following components:   Prothrombin Time 26.5 (*)    All other components within normal limits  PROTIME-INR  PROTIME-INR  I-STAT TROPONIN, ED    EKG  EKG Interpretation  Date/Time:  Friday January 23 2018 12:10:16 EDT Ventricular Rate:  76 PR Interval:    QRS Duration: 169 QT Interval:  403 QTC Calculation: 161 R Axis:   -4 Text Interpretation:  Atrial fibrillation Nonspecific intraventricular conduction delay Abnrm T, consider ischemia, anterolateral lds Confirmed by Tanna Furry [phone]) on 01/23/2018 1:01:29 PM       Radiology Ct Head Wo Contrast  Result Date: 01/23/2018 CLINICAL DATA:  Left lower extremity weakness. EXAM: CT HEAD WITHOUT CONTRAST TECHNIQUE: Contiguous axial images were obtained from the base of the skull through the vertex without  intravenous contrast. COMPARISON:  Brain MRI 12/08/2017.  Head CT 12/08/2017. FINDINGS: Brain: Large bilateral acute on chronic supratentorial subdural hematomas are identified, generating  substantial mass-effect on the cerebral hemispheres bilaterally and approximately 9 mm of right to left midline shift. Right-sided collection measures 22 mm in thickness. Left-sided collection measures up to 14 mm in thickness. No evidence for hydrocephalus. No gross edema or evidence for acute ischemia. 4th ventricle and basilar cisterns are patent. Vascular: Atherosclerotic calcification of the carotid siphons noted at the skull base. No dense MCA sign. Skull: No evidence for fracture. No worrisome lytic or sclerotic lesion. Sinuses/Orbits: The visualized paranasal sinuses and mastoid air cells are clear. Visualized portions of the globes and intraorbital fat are unremarkable. Other: None. IMPRESSION: 1. Bilateral mixed attenuation subdural fluid collections compatible with hematomas. Previous exam showed smaller low-density chronic subdural hematomas or hygromas, but these have clearly progressed in the interval. The larger right-sided extra-axial hematoma measures 22 mm in thickness today compared to about 7 mm on the study from January. 9 mm of right to left midline shift is new in the interval. 2. Critical Value/emergent results were called by telephone at the time of interpretation on 01/23/2018 at 2:37 pm to Dr. Tanna Furry , who verbally acknowledged these results. Electronically Signed   By: Misty Stanley M.D.   On: 01/23/2018 14:38    Procedures Procedures (including critical care time)  Medications Ordered in ED Medications  prothrombin complex conc human (KCENTRA) IVPB 1,562 Units (0 Units Intravenous Stopped 01/23/18 1531)  phytonadione (VITAMIN K) 10 mg in dextrose 5 % 50 mL IVPB (0 mg Intravenous Stopped 01/23/18 1531)     Initial Impression / Assessment and Plan / ED Course  I have reviewed the triage  vital signs and the nursing notes.  Pertinent labs & imaging results that were available during my care of the patient were reviewed by me and considered in my medical decision making (see chart for details).    CT scan shows large bilateral layering subdural hematomas within enlarged hygromas.  Right measures 22 mm, left measures 15 mm.  There is 9 mm of right-to-left shift.  No signs radiographically of herniation.  Patient's INR is pending.  I ordered Wandalee Ferdinand for reversal of his coagulation due to his warfarin.  Placed call to neurosurgery.  CRITICAL CARE Performed by: Lolita Patella   Total critical care time: 30 minutes  Critical care time was exclusive of separately billable procedures and treating other patients.  Critical care was necessary to treat or prevent imminent or life-threatening deterioration.  Critical care was time spent personally by me on the following activities: development of treatment plan with patient and/or surrogate as well as nursing, discussions with consultants, evaluation of patient's response to treatment, examination of patient, obtaining history from patient or surrogate, ordering and performing treatments and interventions, ordering and review of laboratory studies, ordering and review of radiographic studies, pulse oximetry and re-evaluation of patient's condition.   Care discussed with Dr. Saintclair Halsted of neurosurgery.  He is at [facility].  He will have his nurse practitioner see patient here.  He will see patient expeditiously as well.  He requests medicine admission to help follow patient's ongoing chronic medical problems and anticoagulation reversal.  He will discuss intervention with patient upon his arrival.  Final Clinical Impressions(s) / ED Diagnoses   Final diagnoses:  Subdural hematoma [facility])    ED Discharge Orders    None       Tanna Furry, MD 01/23/18 1540    Tanna Furry, MD 01/23/18 1623

## 2018-01-23 NOTE — H&P (Signed)
History and Physical    Ryan Savage AOZ:[id number] DOB: 06/06/1929 DOA: 01/23/2018   PCP: Lajean Manes, MD   Patient coming from:  Home    Chief Complaint: Left leg weakness   HPI: Ryan Savage is a 84 y.o. male with medical history significant for chronic atrial fibrillation, on Coumadin, BPH, CHF, hypertension, hyperlipidemia, OSA intolerant to CPAP, SIADH, recent history of left hemispheric CVA, presenting to the emergency department, we need to current left lower extremity weakness, starting yesterday.  The patient states that he fell twice in January, sliding down on a step yesterday, denying hitting his head, however wife, does report that he did hit his head so there is contradictory information.  He reports that his muscles feeling weak, at which time he lost balance and fell. He denies any nausea or vomiting.  He denies any headaches or vision changes.  He denies any dysphagia.  He denies any shortness of breath, or chest pain.  He denies any abdominal pain.  No lower extremity swelling, or calf pain.  No confusion is reported.  ED Course:  BP 115/69   Pulse 71   Temp 97.9 F (36.6 C) (Oral)   Resp 17   Ht 5\' 10"  (1.778 m)   Wt 75.8 kg (167 lb)   SpO2 100%   BMI 23.96 kg/m   His INR was 2.5.  Is also on aspirin (on and off for the last 3 weeks) other than Coumadin. CT scan shows large bilateral layering subdural hematomas within enlarged hygromas.  Right measures 22 mm, left measures 15 mm.  There is 9 mm of right-to-left shift.  No signs radiographically of herniation.  received KCENTRA VIt K 10 mg  Neurosurgery consultation is considering craniotomy depending on his overall status in a.m. CBC, CMET are unremarkable, with the exception of sodium being 129, which is chronic.  His glucose is 149.  Hemoglobin is 11.8.  Platelets 160.  Creatinine normal at 0.81.  Review of Systems:  As per HPI otherwise all other systems reviewed and are negative  Past Medical History:    Diagnosis Date  . Atrial fibrillation, chronic (HCC)    Chronic..coumadin clinic -Dr Radford Pax  . BPH (benign prostatic hypertrophy)   . CHF (congestive heart failure) ([place])   . [name] (degenerative joint disease)   . Hyperlipidemia    LDL goal less then 100  . Hypertension   . Microscopic hematuria    [name]  . Mitral valve prolapse    with moderate MR  . OSA (obstructive sleep apnea)    intolerant with CPAP (11/19/2017)  . SIADH (syndrome of inappropriate ADH production) ([place])   . Skin cancer    "side of my nose" (11/19/2017)  . Thrombocytopenia (HCC)    Mild- platelet count 143,000 on 02/2011, stable 08/2011    Past Surgical History:  Procedure Laterality Date  . APPENDECTOMY  1950  . LUMBAR DISC SURGERY  1959   ruptured disc repair  . RIGHT/LEFT HEART CATH AND CORONARY ANGIOGRAPHY N/A 11/21/2017   Procedure: RIGHT/LEFT HEART CATH AND CORONARY ANGIOGRAPHY;  Surgeon: Sherren Mocha, MD;  Location: [facility];  Service: Cardiovascular;  Laterality: N/A;  . SKIN CANCER EXCISION Left    "side of my nose"    Social History Social History   Socioeconomic History  . Marital status: Married    Spouse name: Not on file  . Number of children: Not on file  . Years of education: Not on file  . Highest education  level: Not on file  Social Needs  . Financial resource strain: Not on file  . Food insecurity - worry: Not on file  . Food insecurity - inability: Not on file  . Transportation needs - medical: Not on file  . Transportation needs - non-medical: Not on file  Occupational History  . Occupation: retired  Tobacco Use  . Smoking status: Former Smoker    Years: 3.00    Types: Cigarettes    Last attempt to quit: 1958    Years since quitting: 61.2  . Smokeless tobacco: Never Used  Substance and Sexual Activity  . Alcohol use: No  . Drug use: No  . Sexual activity: Not on file  Other Topics Concern  . Not on file  Social History Narrative  . Not on file      Allergies  Allergen Reactions  . Novocain [Procaine] Rash    Family History  Problem Relation Age of Onset  . Heart failure Mother   . Heart attack Father   . [name] Father   . Heart attack Brother   . [name] Brother   . Colon cancer Brother   . Colon cancer Sister       Prior to Admission medications   Medication Sig Start Date End Date Taking? Authorizing Provider  acetaminophen (TYLENOL) 500 MG tablet Take 1,000 mg by mouth every 6 (six) hours as needed for headache.   Yes [provider]  atorvastatin (LIPITOR) 20 MG tablet Take 1 tablet (20 mg total) by mouth daily at 6 PM. Patient taking differently: Take 10 mg by mouth daily at 6 PM.  11/27/17  Yes Allie Bossier, MD  digoxin (LANOXIN) 0.125 MG tablet Take 1 tablet (0.125 mg total) by mouth daily. 11/28/17  Yes Allie Bossier, MD  finasteride (PROSCAR) 5 MG tablet Take 5 mg by mouth daily.  09/06/13  Yes [provider]  fluticasone (FLONASE) 50 MCG/ACT nasal spray Place 1 spray into both nostrils daily.   Yes [provider]  furosemide (LASIX) 20 MG tablet Take 1 tablet (20 mg total) by mouth daily. 11/28/17  Yes Allie Bossier, MD  lactulose (CHRONULAC) 10 GM/15ML solution Take 15 mLs (10 g total) by mouth 2 (two) times daily as needed for mild constipation. 11/27/17  Yes Allie Bossier, MD  metoprolol succinate (TOPROL-XL) 25 MG 24 hr tablet Take 3 tablets (75 mg total) by mouth daily. Patient taking differently: Take 25 mg by mouth 2 (two) times daily.  11/28/17  Yes Allie Bossier, MD  Multiple Vitamin (MULTIVITAMIN) capsule Take by mouth.   Yes [provider]  pantoprazole (PROTONIX) 40 MG tablet Take 1 tablet (40 mg total) by mouth 2 (two) times daily. Patient taking differently: Take 40 mg by mouth at bedtime.  12/10/17  Yes Nita Sells, MD  warfarin (COUMADIN) 3 MG tablet Take 0.5 tablets (1.5 mg total) by mouth daily at 6 PM. Ensure follow-up to Coumadin clinic on Monday  for INR check Patient taking differently: Take 3 mg by mouth daily at 6 PM. Ensure follow-up to Coumadin clinic on Monday for INR check 11/27/17  Yes Allie Bossier, MD  aspirin 325 MG EC tablet Take 1 tablet (325 mg total) by mouth daily. Patient not taking: Reported on 01/23/2018 12/10/17   Nita Sells, MD    Physical Exam:  Vitals:   01/23/18 1530 01/23/18 1545 01/23/18 1600 01/23/18 1630  BP: 114/70 101/67 (!) 117/59 115/69  Pulse: (!) 152 73  71 71  Resp: 13 (!) 23 20 17   Temp:      TempSrc:      SpO2: 94% 96% 100% 100%  Weight:      Height:       Constitutional: NAD, calm, comfortable  Eyes: PERRL, lids and conjunctivae normal ENMT: Mucous membranes are moist, without exudate or lesions  Neck: normal, supple, no masses, no thyromegaly Respiratory: clear to auscultation bilaterally, no wheezing, no crackles. Normal respiratory effort  Cardiovascular: Regular rate and rhythm,  murmur, rubs or gallops. No extremity edema. 2+ pedal pulses. No carotid bruits.  Abdomen: Soft, non tender, No hepatosplenomegaly. Bowel sounds positive.  Musculoskeletal: no clubbing / cyanosis. Moves all extremities. NO visible bruises  Skin: no jaundice, No lesions.  Neurologic: Sensation intact  Strength equal except mild 4/5 weakness in LLE  Psychiatric:   Alert and oriented x 3. Normal mood.     Labs on Admission: I have personally reviewed following labs and imaging studies  CBC: Recent Labs  Lab 01/23/18 1310  WBC 5.9  NEUTROABS 3.7  HGB 11.8*  HCT 35.1*  MCV 92.4  PLT 161    Basic Metabolic Panel: Recent Labs  Lab 01/23/18 1310  NA 129*  K 3.9  CL 96*  CO2 25  GLUCOSE 149*  BUN 11  CREATININE 0.81  CALCIUM 8.7*    GFR: Estimated Creatinine Clearance: 67.6 mL/min (by C-G formula based on SCr of 0.81 mg/dL).  Liver Function Tests: Recent Labs  Lab 01/23/18 1310  AST 23  ALT 13*  ALKPHOS 56  BILITOT 0.7  PROT 6.7  ALBUMIN 3.0*   No results for input(s):  LIPASE, AMYLASE in the last 168 hours. No results for input(s): AMMONIA in the last 168 hours.  Coagulation Profile: Recent Labs  Lab 01/23/18 1449  INR 2.47    Cardiac Enzymes: No results for input(s): CKTOTAL, CKMB, CKMBINDEX, TROPONINI in the last 168 hours.  BNP (last 3 results) No results for input(s): PROBNP in the last 8760 hours.  HbA1C: No results for input(s): HGBA1C in the last 72 hours.  CBG: No results for input(s): GLUCAP in the last 168 hours.  Lipid Profile: No results for input(s): CHOL, HDL, LDLCALC, TRIG, CHOLHDL, LDLDIRECT in the last 72 hours.  Thyroid Function Tests: No results for input(s): TSH, T4TOTAL, FREET4, T3FREE, THYROIDAB in the last 72 hours.  Anemia Panel: No results for input(s): VITAMINB12, FOLATE, FERRITIN, TIBC, IRON, RETICCTPCT in the last 72 hours.  Urine analysis:    Component Value Date/Time   COLORURINE YELLOW 01/23/2018 1310   APPEARANCEUR CLEAR 01/23/2018 1310   LABSPEC 1.009 01/23/2018 1310   PHURINE 7.0 01/23/2018 1310   GLUCOSEU NEGATIVE 01/23/2018 1310   HGBUR SMALL (A) 01/23/2018 1310   BILIRUBINUR NEGATIVE 01/23/2018 1310   KETONESUR NEGATIVE 01/23/2018 1310   PROTEINUR NEGATIVE 01/23/2018 1310   NITRITE NEGATIVE 01/23/2018 1310   LEUKOCYTESUR NEGATIVE 01/23/2018 1310    Sepsis Labs: @LABRCNTIP (procalcitonin:4,lacticidven:4) )No results found for this or any previous visit (from the past 240 hour(s)).   Radiological Exams on Admission: Ct Head Wo Contrast  Result Date: 01/23/2018 CLINICAL DATA:  Left lower extremity weakness. EXAM: CT HEAD WITHOUT CONTRAST TECHNIQUE: Contiguous axial images were obtained from the base of the skull through the vertex without intravenous contrast. COMPARISON:  Brain MRI 12/08/2017.  Head CT 12/08/2017. FINDINGS: Brain: Large bilateral acute on chronic supratentorial subdural hematomas are identified, generating substantial mass-effect on the cerebral hemispheres bilaterally and  approximately 9 mm of right  to left midline shift. Right-sided collection measures 22 mm in thickness. Left-sided collection measures up to 14 mm in thickness. No evidence for hydrocephalus. No gross edema or evidence for acute ischemia. 4th ventricle and basilar cisterns are patent. Vascular: Atherosclerotic calcification of the carotid siphons noted at the skull base. No dense MCA sign. Skull: No evidence for fracture. No worrisome lytic or sclerotic lesion. Sinuses/Orbits: The visualized paranasal sinuses and mastoid air cells are clear. Visualized portions of the globes and intraorbital fat are unremarkable. Other: None. IMPRESSION: 1. Bilateral mixed attenuation subdural fluid collections compatible with hematomas. Previous exam showed smaller low-density chronic subdural hematomas or hygromas, but these have clearly progressed in the interval. The larger right-sided extra-axial hematoma measures 22 mm in thickness today compared to about 7 mm on the study from January. 9 mm of right to left midline shift is new in the interval. 2. Critical Value/emergent results were called by telephone at the time of interpretation on 01/23/2018 at 2:37 pm to Dr. Tanna Furry , who verbally acknowledged these results. Electronically Signed   By: Misty Stanley M.D.   On: 01/23/2018 14:38    EKG: Independently reviewed.  Assessment/Plan Active Problems:   Permanent atrial fibrillation (HCC)   Benign essential HTN   Mitral regurgitation   Hyperglycemia   SIADH (syndrome of inappropriate ADH production) (HCC)   Subclinical hyperthyroidism   OSA (obstructive sleep apnea)   Chronic systolic heart failure (HCC)   Cerebral embolism with cerebral infarction   Malnutrition of moderate degree   Left lower extremity weakness, and progressive region of subdural hematoma, in a patient with a history of cerebral embolism with cerebral infarction history  CT of the head showed bilateral mixed attenuation SDH, progressing since  his previous scan with a 9 mm shift.  He is essentially asymptomatic, except for very mild left lower extremity weakness.  However, because he is on Coumadin, this was reversed, considering a craniotomy per neurosurgery.  Received Gabriel Rainwater and Vit K 10 Appreciate their involvement. Admit to stepdown inpatient CT of the head in a.m. CBC in a.m. Hold Coumadin and aspirin  Hold NSAIDs. NPO after midnight  Neuro checks Fall precautions PT and OT when deemed appropriate   Hypertension BP 120/62   Pulse 72   Continue home anti-hypertensive medications in am, as patient took his home meds   Hyperglycemia, chronic   No prior documented history of DM current glucose 149  Check A1C Check CBG bid   Atrial Fibrillation CHA2DS2-VASc score 5, on anticoagulation with Coumadin Continue meds with digoxin, hold Coumadin as above    Chronic  Systolic CHF , last EF 59-45 %, systolic mod to severely reduced   telemetry  monitor I/Os and daily weights prn 02 Patient took his am meds  COntinue LAnoxin pending on plans for surgery   GERD, no acute symptoms Continue PPI  History of SIADH, current NA 129, range 129-133 Gentle IV NS  Monitor BMMET    DVT prophylaxis:  SCD  Code Status:    Full  Family Communication:  Discussed with patient and wife  Disposition Plan: Expect patient to be discharged to home after condition improves Consults called:    Neurosurgery Dr Saintclair Halsted per EDP  Admission status: SDU IP   Sharene Butters, PA-C [facility]   Amion text  [phone]   01/23/2018, 4:56 PM

## 2018-01-23 NOTE — ED Notes (Signed)
Attempted report X1

## 2018-01-23 NOTE — ED Triage Notes (Signed)
Pt arrived via [facility] from home with reports of increasing weakness. Pt reports that yesterday around 430 pm when walking back from his barn his left thigh muscle felt weak and he had to sit down. Found by neighbor who helped pt back into the house. Today family noticed that pt was leaning to left side. Equal grip strength and aware of leaning. Pt had stroke in January of this year reports aphasia at that time. No aplasia today. Denies dizziness.

## 2018-01-23 NOTE — Consult Note (Signed)
Reason for Consult:SDH  Referring Physician: EDP  Ryan Savage is an 84 y.o. male.   HPI:  Pleasant 84 year old patient comes in today with left leg weakness that started yesterday. He states that he fell twice in January and then slid down on a step yesterday but denies hitting his head. He does have a history stroke, a fib (on coumadin), and CHF. He lives with his wife at home who accompanies him today. States that the muscles in his left leg feel weak. Denies any NV or vision changes.   Past Medical History:  Diagnosis Date  . Atrial fibrillation, chronic (HCC)    Chronic..coumadin clinic -Dr Radford Pax  . BPH (benign prostatic hypertrophy)   . CHF (congestive heart failure) ([place])   . [name] (degenerative joint disease)   . Hyperlipidemia    LDL goal less then 100  . Hypertension   . Microscopic hematuria    [name]  . Mitral valve prolapse    with moderate MR  . OSA (obstructive sleep apnea)    intolerant with CPAP (11/19/2017)  . SIADH (syndrome of inappropriate ADH production) ([place])   . Skin cancer    "side of my nose" (11/19/2017)  . Thrombocytopenia (HCC)    Mild- platelet count 143,000 on 02/2011, stable 08/2011    Past Surgical History:  Procedure Laterality Date  . APPENDECTOMY  1950  . LUMBAR DISC SURGERY  1959   ruptured disc repair  . RIGHT/LEFT HEART CATH AND CORONARY ANGIOGRAPHY N/A 11/21/2017   Procedure: RIGHT/LEFT HEART CATH AND CORONARY ANGIOGRAPHY;  Surgeon: Sherren Mocha, MD;  Location: [facility];  Service: Cardiovascular;  Laterality: N/A;  . SKIN CANCER EXCISION Left    "side of my nose"    Allergies  Allergen Reactions  . Novocain [Procaine] Rash    Social History   Tobacco Use  . Smoking status: Former Smoker    Years: 3.00    Types: Cigarettes    Last attempt to quit: 1958    Years since quitting: 61.2  . Smokeless tobacco: Never Used  Substance Use Topics  . Alcohol use: No    Family History  Problem Relation Age of Onset  .  Heart failure Mother   . Heart attack Father   . [name] Father   . Heart attack Brother   . [name] Brother   . Colon cancer Brother   . Colon cancer Sister      Review of Systems  Positive ROS: left leg weakness  All other systems have been reviewed and were otherwise negative with the exception of those mentioned in the HPI and as above.  Objective: Vital signs in last 24 hours: Temp:  [97.9 F (36.6 C)] 97.9 F (36.6 C) (03/15 1224) Pulse Rate:  [44-152] 71 (03/15 1630) Resp:  [13-24] 17 (03/15 1630) BP: (101-130)/(59-95) 115/69 (03/15 1630) SpO2:  [94 %-100 %] 100 % (03/15 1630) Weight:  [75.8 kg (167 lb)] 75.8 kg (167 lb) (03/15 1225)  General Appearance: Alert, cooperative, no distress, appears stated age Head: Normocephalic, without obvious abnormality, atraumatic Eyes: PERRL, conjunctiva/corneas clear, EOM's intact, fundi benign, both eyes      Neck: Supple, symmetrical, trachea midline Back: Symmetric, no curvature, ROM normal Lungs: respirations unlabored Heart: Regular rate and rhythm Abdomen: not tested Extremities: Extremities normal, atraumatic, no cyanosis or edema Pulses: 2+ and symmetric all extremities Skin: Skin color, texture, turgor normal, no rashes or lesions  NEUROLOGIC:   Mental status: A&O x4, no aphasia, good  attention span, Memory and fund of knowledge Motor Exam - grossly normal, normal tone and bulk Sensory Exam - grossly normal Reflexes: symmetric, no pathologic reflexes, No Hoffman's, No clonus Coordination - grossly normal Gait - not tested Balance - not tested Cranial Nerves: I: smell Not tested  II: visual acuity  OS: na    OD: na  II: visual fields Full to confrontation  II: pupils Equal, round, reactive to light  III,VII: ptosis None  III,IV,VI: extraocular muscles  Full ROM  V: mastication Normal  V: facial light touch sensation  Normal  V,VII: corneal reflex  Present  VII: facial muscle function - upper  Normal  VII: facial  muscle function - lower Normal  VIII: hearing Not tested  IX: soft palate elevation  Normal  IX,X: gag reflex Present  XI: trapezius strength  5/5  XI: sternocleidomastoid strength 5/5  XI: neck flexion strength  5/5  XII: tongue strength  Normal    Data Review Lab Results  Component Value Date   WBC 5.9 01/23/2018   HGB 11.8 (L) 01/23/2018   HCT 35.1 (L) 01/23/2018   MCV 92.4 01/23/2018   PLT 160 01/23/2018   Lab Results  Component Value Date   NA 129 (L) 01/23/2018   K 3.9 01/23/2018   CL 96 (L) 01/23/2018   CO2 25 01/23/2018   BUN 11 01/23/2018   CREATININE 0.81 01/23/2018   GLUCOSE 149 (H) 01/23/2018   Lab Results  Component Value Date   INR 2.47 01/23/2018    Radiology: Ct Head Wo Contrast  Result Date: 01/23/2018 CLINICAL DATA:  Left lower extremity weakness. EXAM: CT HEAD WITHOUT CONTRAST TECHNIQUE: Contiguous axial images were obtained from the base of the skull through the vertex without intravenous contrast. COMPARISON:  Brain MRI 12/08/2017.  Head CT 12/08/2017. FINDINGS: Brain: Large bilateral acute on chronic supratentorial subdural hematomas are identified, generating substantial mass-effect on the cerebral hemispheres bilaterally and approximately 9 mm of right to left midline shift. Right-sided collection measures 22 mm in thickness. Left-sided collection measures up to 14 mm in thickness. No evidence for hydrocephalus. No gross edema or evidence for acute ischemia. 4th ventricle and basilar cisterns are patent. Vascular: Atherosclerotic calcification of the carotid siphons noted at the skull base. No dense MCA sign. Skull: No evidence for fracture. No worrisome lytic or sclerotic lesion. Sinuses/Orbits: The visualized paranasal sinuses and mastoid air cells are clear. Visualized portions of the globes and intraorbital fat are unremarkable. Other: None. IMPRESSION: 1. Bilateral mixed attenuation subdural fluid collections compatible with hematomas. Previous exam  showed smaller low-density chronic subdural hematomas or hygromas, but these have clearly progressed in the interval. The larger right-sided extra-axial hematoma measures 22 mm in thickness today compared to about 7 mm on the study from January. 9 mm of right to left midline shift is new in the interval. 2. Critical Value/emergent results were called by telephone at the time of interpretation on 01/23/2018 at 2:37 pm to Dr. Tanna Furry , who verbally acknowledged these results. Electronically Signed   By: Misty Stanley M.D.   On: 01/23/2018 14:38     Assessment/Plan: Pleasant 84 year old presents today after developing left leg weakness that started yesterday. This has been his only symptom. CT showed bilateral mixed attenuation sdh that have progressed since his previous scan with a 9 mm shift. Patient is doing very well on exam. Unfortunately he is on coumadin so that will need to be reversed. Will have medicine admit him. Will  see how he is doing in the morning and consider doing a craniotomy. Follow up head ct in AM  [place] 01/23/2018 4:56 PM

## 2018-01-24 ENCOUNTER — Inpatient Hospital Stay (HOSPITAL_COMMUNITY): Payer: Medicare Other

## 2018-01-24 DIAGNOSIS — S065X9A Traumatic subdural hemorrhage with loss of consciousness of unspecified duration, initial encounter: Principal | ICD-10-CM

## 2018-01-24 LAB — BASIC METABOLIC PANEL
Anion gap: 9 (ref 5–15)
BUN: 7 mg/dL (ref 6–20)
CO2: 25 mmol/L (ref 22–32)
Calcium: 8.6 mg/dL — ABNORMAL LOW (ref 8.9–10.3)
Chloride: 97 mmol/L — ABNORMAL LOW (ref 101–111)
Creatinine, Ser: 0.73 mg/dL (ref 0.61–1.24)
GFR calc Af Amer: >60 mL/min (ref >60)
GFR calc non Af Amer: >60 mL/min (ref >60)
Glucose, Bld: 102 mg/dL — ABNORMAL HIGH (ref 65–99)
Potassium: 3.5 mmol/L (ref 3.5–5.1)
Sodium: 131 mmol/L — ABNORMAL LOW (ref 135–145)

## 2018-01-24 LAB — PROTIME-INR
INR: 1.44
INR: 1.29
INR: 1.26
INR: 1.22
Prothrombin Time: 17.4 seconds — ABNORMAL HIGH (ref 11.4–15.2)
Prothrombin Time: 16 seconds — ABNORMAL HIGH (ref 11.4–15.2)
Prothrombin Time: 15.7 seconds — ABNORMAL HIGH (ref 11.4–15.2)
Prothrombin Time: 15.3 seconds — ABNORMAL HIGH (ref 11.4–15.2)

## 2018-01-24 LAB — TYPE AND SCREEN
ABO/RH(D): O POS
Antibody Screen: NEGATIVE

## 2018-01-24 LAB — APTT: aPTT: 31 seconds (ref 24–36)

## 2018-01-24 LAB — CBC
HCT: 34.7 % — ABNORMAL LOW (ref 39.0–52.0)
Hemoglobin: 11.9 g/dL — ABNORMAL LOW (ref 13.0–17.0)
MCH: 31.1 pg (ref 26.0–34.0)
MCHC: 34.3 g/dL (ref 30.0–36.0)
MCV: 90.6 fL (ref 78.0–100.0)
Platelets: 164 10*3/uL (ref 150–400)
RBC: 3.83 MIL/uL — ABNORMAL LOW (ref 4.22–5.81)
RDW: 14 % (ref 11.5–15.5)
WBC: 6.9 10*3/uL (ref 4.0–10.5)

## 2018-01-24 LAB — GLUCOSE, CAPILLARY: Glucose-Capillary: 95 mg/dL (ref 65–99)

## 2018-01-24 MED ORDER — SODIUM CHLORIDE 0.9 % IV SOLN
Freq: Once | INTRAVENOUS | Status: AC
Start: 2018-01-24 — End: 2018-01-24
  Administered 2018-01-24: 10:00:00 via INTRAVENOUS

## 2018-01-24 MED ORDER — PANTOPRAZOLE SODIUM 40 MG PO TBEC
40.0000 mg | DELAYED_RELEASE_TABLET | Freq: Two times a day (BID) | ORAL | Status: DC
  Administered 2018-01-24 – 2018-01-29 (×11): 40 mg via ORAL
  Filled 2018-01-24 (×11): qty 1

## 2018-01-24 MED ORDER — DEXAMETHASONE 4 MG PO TABS
4.0000 mg | ORAL_TABLET | Freq: Four times a day (QID) | ORAL | Status: DC
  Administered 2018-01-24 (×4): 4 mg via ORAL
  Filled 2018-01-24 (×3): qty 1

## 2018-01-24 NOTE — Plan of Care (Signed)
  Progressing Education: Knowledge of General Education information will improve 01/24/2018 1411 - Progressing by Dakhari Zuver, Vertell Limber, RN Health Behavior/Discharge Planning: Ability to manage health-related needs will improve 01/24/2018 1411 - Progressing by Nizar Cutler, Vertell Limber, RN Clinical Measurements: Ability to maintain clinical measurements within normal limits will improve 01/24/2018 1411 - Progressing by Evalee Jefferson, RN Will remain free from infection 01/24/2018 1411 - Progressing by Evalee Jefferson, RN Diagnostic test results will improve 01/24/2018 1411 - Progressing by Evalee Jefferson, RN Respiratory complications will improve 01/24/2018 1411 - Progressing by Evalee Jefferson, RN Cardiovascular complication will be avoided 01/24/2018 1411 - Progressing by Evalee Jefferson, RN Activity: Risk for activity intolerance will decrease 01/24/2018 1411 - Progressing by Evalee Jefferson, RN Nutrition: Adequate nutrition will be maintained 01/24/2018 1411 - Progressing by Evalee Jefferson, RN Coping: Level of anxiety will decrease 01/24/2018 1411 - Progressing by Evalee Jefferson, RN Elimination: Will not experience complications related to bowel motility 01/24/2018 1411 - Progressing by Evalee Jefferson, RN Will not experience complications related to urinary retention 01/24/2018 1411 - Progressing by Evalee Jefferson, RN Pain Managment: General experience of comfort will improve 01/24/2018 1411 - Progressing by Evalee Jefferson, RN Safety: Ability to remain free from injury will improve 01/24/2018 1411 - Progressing by Evalee Jefferson, RN Skin Integrity: Risk for impaired skin integrity will decrease 01/24/2018 1411 - Progressing by Evalee Jefferson, RN

## 2018-01-24 NOTE — Progress Notes (Signed)
Subjective: Patient reports doing well minimal headache denies any new weakness.  Objective: Vital signs in last 24 hours: Temp:  [97.9 F (36.6 C)-98.2 F (36.8 C)] 98.2 F (36.8 C) (03/15 2100) Pulse Rate:  [44-152] 74 (03/16 0300) Resp:  [13-29] 16 (03/16 0300) BP: (101-155)/(59-95) 119/69 (03/16 0400) SpO2:  [94 %-100 %] 97 % (03/15 2300) Weight:  [75.8 kg (167 lb)-79 kg (174 lb 2.6 oz)] 79 kg (174 lb 2.6 oz) (03/16 0404)  Intake/Output from previous day: 03/15 0701 - 03/16 0700 In: 435.8 [I.V.:435.8] Out: 200 [Urine:200] Intake/Output this shift: No intake/output data recorded.  awake alert oriented strength 5 out of 5 with no pronator drift  Lab Results: Recent Labs    01/23/18 1310 01/24/18 0235  WBC 5.9 6.9  HGB 11.8* 11.9*  HCT 35.1* 34.7*  PLT 160 164   BMET Recent Labs    01/23/18 1310 01/24/18 0235  NA 129* 131*  K 3.9 3.5  CL 96* 97*  CO2 25 25  GLUCOSE 149* 102*  BUN 11 7  CREATININE 0.81 0.73  CALCIUM 8.7* 8.6*    Studies/Results: Ct Head Wo Contrast  Result Date: 01/24/2018 CLINICAL DATA:  Subdural hemorrhage EXAM: CT HEAD WITHOUT CONTRAST TECHNIQUE: Contiguous axial images were obtained from the base of the skull through the vertex without intravenous contrast. COMPARISON:  Head CT 01/23/2018 FINDINGS: Brain: Bilateral mixed attenuation subdural hematomas are unchanged in size, measuring 22 mm on the right and 13 mm on the left. Mass effect on the lateral ventricles is unchanged. At the level of the foramina of [place], there is leftward midline shift of 7 mm. No intraparenchymal hemorrhage. The size of the temporal horn of the right lateral ventricle has increased slightly compared to the previous scan. There is no hydrocephalus. Vascular: No hyperdense vessel or unexpected vascular calcification. Skull: Normal visualized skull base, calvarium and extracranial soft tissues. Sinuses/Orbits: No sinus fluid levels or advanced mucosal thickening. No  mastoid effusion. Normal orbits. IMPRESSION: 1. Unchanged size of bilateral acute on chronic subdural hematomata, right larger than left. 2. Unchanged 7 mm leftward midline shift. 3. Slightly increased size of the temporal horn of the right lateral ventricle. This may indicate early evidence of ventricular entrapment. Close attention is recommended on follow-up scans. Electronically Signed   By: Ulyses Jarred M.D.   On: 01/24/2018 04:57   Ct Head Wo Contrast  Result Date: 01/23/2018 CLINICAL DATA:  Left lower extremity weakness. EXAM: CT HEAD WITHOUT CONTRAST TECHNIQUE: Contiguous axial images were obtained from the base of the skull through the vertex without intravenous contrast. COMPARISON:  Brain MRI 12/08/2017.  Head CT 12/08/2017. FINDINGS: Brain: Large bilateral acute on chronic supratentorial subdural hematomas are identified, generating substantial mass-effect on the cerebral hemispheres bilaterally and approximately 9 mm of right to left midline shift. Right-sided collection measures 22 mm in thickness. Left-sided collection measures up to 14 mm in thickness. No evidence for hydrocephalus. No gross edema or evidence for acute ischemia. 4th ventricle and basilar cisterns are patent. Vascular: Atherosclerotic calcification of the carotid siphons noted at the skull base. No dense MCA sign. Skull: No evidence for fracture. No worrisome lytic or sclerotic lesion. Sinuses/Orbits: The visualized paranasal sinuses and mastoid air cells are clear. Visualized portions of the globes and intraorbital fat are unremarkable. Other: None. IMPRESSION: 1. Bilateral mixed attenuation subdural fluid collections compatible with hematomas. Previous exam showed smaller low-density chronic subdural hematomas or hygromas, but these have clearly progressed in the interval. The larger right-sided extra-axial  hematoma measures 22 mm in thickness today compared to about 7 mm on the study from January. 9 mm of right to left midline  shift is new in the interval. 2. Critical Value/emergent results were called by telephone at the time of interpretation on 01/23/2018 at 2:37 pm to Dr. Tanna Furry , who verbally acknowledged these results. Electronically Signed   By: Misty Stanley M.D.   On: 01/23/2018 14:38    Assessment/Plan: Possibly 14 admission for management of subacute subdural hematomas. Patient INR this morning was 1.4 for which is still too high for craniectomy. Have ordered 2 units of FFP will allow the patient to resume his diet this morning will reschedule for tomorrow morning. We'll need a repeat INR after these 2 units of FFP. We will need his INR to be less than 1.2 for bur holes.  LOS: 1 day     Sir Mallis P 01/24/2018, 7:52 AM

## 2018-01-24 NOTE — Progress Notes (Signed)
PROGRESS NOTE    Ryan Savage  CXK:[id number] DOB: [birth year] DOA: 01/23/2018 PCP: Lajean Manes, MD     Brief Narrative:  Ryan Savage is a 84 y.o. male with past medical history significant for chronic atrial fib, on coumadin, CHF, recent hx CVA, SIADH, OSA, HTN, HLD who presents with left leg weakness. He states that he is able to walk, but feeling like his left leg is significantly weak and off balance. In the ED, CT head revealed bilateral subdual hematomas with 48mm right to left shift. INR was reversed with vit K and Kcentra. Neurosurgery consulted.   Assessment & Plan:   Principal Problem:   Bilateral subdural hematomas (HCC) Active Problems:   Permanent atrial fibrillation (HCC)   Benign essential HTN   Mitral regurgitation   Hyperglycemia   SIADH (syndrome of inappropriate ADH production) (HCC)   Subclinical hyperthyroidism   OSA (obstructive sleep apnea)   Chronic systolic heart failure (HCC)   Cerebral embolism with cerebral infarction   Malnutrition of moderate degree   Bilateral subdural hematoma -Neurosurgery planning for surgery when INR < 1.2  -Neuro status stable -Repeat CT stable  -Decadron   Coagulopathy on coumadin -Reversed with KCentra, Vit K, FFP -Need INR < 1.2 prior to surgery   Chronic A Fib -Hold coumadin -Continue digoxin, lasix, toprol   SIADH -Sodium level stable   GERD -PPI    DVT prophylaxis: SCD Code Status: Full Family Communication: No family at bedside Disposition Plan: Pending INR and surgery   Consultants:   Neurosurgery  Procedures:   None  Antimicrobials:  Anti-infectives (From admission, onward)   None       Subjective: Feeling well this morning.  Did not sleep much due to constant interruption by staff.  Has not been out of bed.  No new complaints.  Objective: Vitals:   01/24/18 0900 01/24/18 0945 01/24/18 1017 01/24/18 1242  BP: 123/80 125/66 137/88 125/89  Pulse: 73 72 85   Resp: 17 15 (!)  23   Temp:  (!) 97.5 F (36.4 C) (!) 97.5 F (36.4 C) (!) 97.3 F (36.3 C)  TempSrc:  Oral Oral Oral  SpO2: 99% 100% 98%   Weight:      Height:        Intake/Output Summary (Last 24 hours) at 01/24/2018 1356 Last data filed at 01/24/2018 1243 Gross per 24 hour  Intake 675.83 ml  Output 400 ml  Net 275.83 ml   Filed Weights   01/23/18 1225 01/23/18 2100 01/24/18 0404  Weight: 75.8 kg (167 lb) 75.8 kg (167 lb 1.7 oz) 79 kg (174 lb 2.6 oz)    Examination:  General exam: Appears calm and comfortable  Respiratory system: Clear to auscultation. Respiratory effort normal. Cardiovascular system: S1 & S2 heard, Irreg rhythm. No JVD, murmurs, rubs, gallops or clicks. No pedal edema. Gastrointestinal system: Abdomen is nondistended, soft and nontender. No organomegaly or masses felt. Normal bowel sounds heard. Central nervous system: Alert and oriented. LLE strength +4/5  Extremities: Symmetric  Skin: No rashes, lesions or ulcers Psychiatry: Judgement and insight appear normal. Mood & affect appropriate.   Data Reviewed: I have personally reviewed following labs and imaging studies  CBC: Recent Labs  Lab 01/23/18 1310 01/24/18 0235  WBC 5.9 6.9  NEUTROABS 3.7  --   HGB 11.8* 11.9*  HCT 35.1* 34.7*  MCV 92.4 90.6  PLT 160 970   Basic Metabolic Panel: Recent Labs  Lab 01/23/18 1310 01/24/18 0235  NA 129* 131*  K 3.9 3.5  CL 96* 97*  CO2 25 25  GLUCOSE 149* 102*  BUN 11 7  CREATININE 0.81 0.73  CALCIUM 8.7* 8.6*   GFR: Estimated Creatinine Clearance: 68.4 mL/min (by C-G formula based on SCr of 0.73 mg/dL). Liver Function Tests: Recent Labs  Lab 01/23/18 1310  AST 23  ALT 13*  ALKPHOS 56  BILITOT 0.7  PROT 6.7  ALBUMIN 3.0*   No results for input(s): LIPASE, AMYLASE in the last 168 hours. No results for input(s): AMMONIA in the last 168 hours. Coagulation Profile: Recent Labs  Lab 01/23/18 1449 01/23/18 1638 01/23/18 2105 01/24/18 0235 01/24/18 0825    INR 2.47 1.24 1.36 1.44 1.29   Cardiac Enzymes: No results for input(s): CKTOTAL, CKMB, CKMBINDEX, TROPONINI in the last 168 hours. BNP (last 3 results) No results for input(s): PROBNP in the last 8760 hours. HbA1C: No results for input(s): HGBA1C in the last 72 hours. CBG: Recent Labs  Lab 01/24/18 0625  GLUCAP 95   Lipid Profile: No results for input(s): CHOL, HDL, LDLCALC, TRIG, CHOLHDL, LDLDIRECT in the last 72 hours. Thyroid Function Tests: No results for input(s): TSH, T4TOTAL, FREET4, T3FREE, THYROIDAB in the last 72 hours. Anemia Panel: No results for input(s): VITAMINB12, FOLATE, FERRITIN, TIBC, IRON, RETICCTPCT in the last 72 hours. Sepsis Labs: No results for input(s): PROCALCITON, LATICACIDVEN in the last 168 hours.  Recent Results (from the past 240 hour(s))  Surgical pcr screen     Status: None   Collection Time: 01/23/18  9:50 PM  Result Value Ref Range Status   MRSA, PCR NEGATIVE NEGATIVE Final   Staphylococcus aureus NEGATIVE NEGATIVE Final    Comment: (NOTE) The Xpert SA Assay (FDA approved for NASAL specimens in patients 40 years of age and older), is one component of a comprehensive surveillance program. It is not intended to diagnose infection nor to guide or monitor treatment. Performed at [facility], [place] [address], [place], [place] [address]        Radiology Studies: Ct Head Wo Contrast  Result Date: 01/24/2018 CLINICAL DATA:  Subdural hemorrhage EXAM: CT HEAD WITHOUT CONTRAST TECHNIQUE: Contiguous axial images were obtained from the base of the skull through the vertex without intravenous contrast. COMPARISON:  Head CT 01/23/2018 FINDINGS: Brain: Bilateral mixed attenuation subdural hematomas are unchanged in size, measuring 22 mm on the right and 13 mm on the left. Mass effect on the lateral ventricles is unchanged. At the level of the foramina of [place], there is leftward midline shift of 7 mm. No intraparenchymal hemorrhage. The size of  the temporal horn of the right lateral ventricle has increased slightly compared to the previous scan. There is no hydrocephalus. Vascular: No hyperdense vessel or unexpected vascular calcification. Skull: Normal visualized skull base, calvarium and extracranial soft tissues. Sinuses/Orbits: No sinus fluid levels or advanced mucosal thickening. No mastoid effusion. Normal orbits. IMPRESSION: 1. Unchanged size of bilateral acute on chronic subdural hematomata, right larger than left. 2. Unchanged 7 mm leftward midline shift. 3. Slightly increased size of the temporal horn of the right lateral ventricle. This may indicate early evidence of ventricular entrapment. Close attention is recommended on follow-up scans. Electronically Signed   By: Ulyses Jarred M.D.   On: 01/24/2018 04:57   Ct Head Wo Contrast  Result Date: 01/23/2018 CLINICAL DATA:  Left lower extremity weakness. EXAM: CT HEAD WITHOUT CONTRAST TECHNIQUE: Contiguous axial images were obtained from the base of the skull through the vertex without intravenous contrast. COMPARISON:  Brain MRI 12/08/2017.  Head CT 12/08/2017. FINDINGS: Brain: Large bilateral acute on chronic supratentorial subdural hematomas are identified, generating substantial mass-effect on the cerebral hemispheres bilaterally and approximately 9 mm of right to left midline shift. Right-sided collection measures 22 mm in thickness. Left-sided collection measures up to 14 mm in thickness. No evidence for hydrocephalus. No gross edema or evidence for acute ischemia. 4th ventricle and basilar cisterns are patent. Vascular: Atherosclerotic calcification of the carotid siphons noted at the skull base. No dense MCA sign. Skull: No evidence for fracture. No worrisome lytic or sclerotic lesion. Sinuses/Orbits: The visualized paranasal sinuses and mastoid air cells are clear. Visualized portions of the globes and intraorbital fat are unremarkable. Other: None. IMPRESSION: 1. Bilateral mixed  attenuation subdural fluid collections compatible with hematomas. Previous exam showed smaller low-density chronic subdural hematomas or hygromas, but these have clearly progressed in the interval. The larger right-sided extra-axial hematoma measures 22 mm in thickness today compared to about 7 mm on the study from January. 9 mm of right to left midline shift is new in the interval. 2. Critical Value/emergent results were called by telephone at the time of interpretation on 01/23/2018 at 2:37 pm to Dr. Tanna Furry , who verbally acknowledged these results. Electronically Signed   By: Misty Stanley M.D.   On: 01/23/2018 14:38      Scheduled Meds: . dexamethasone  4 mg Oral Q6H  . digoxin  0.125 mg Oral Daily  . finasteride  5 mg Oral Daily  . furosemide  20 mg Oral Daily  . metoprolol succinate  25 mg Oral BID  . pantoprazole  40 mg Oral BID   Continuous Infusions:   LOS: 1 day    Time spent: 25 minutes   Dessa Phi, DO [facility] [email or web address] Password [email or web address] 01/24/2018, 1:56 PM

## 2018-01-25 ENCOUNTER — Inpatient Hospital Stay (HOSPITAL_COMMUNITY): Payer: Medicare Other | Admitting: Anesthesiology

## 2018-01-25 ENCOUNTER — Encounter (HOSPITAL_COMMUNITY)
Admission: EM | Disposition: A | Discharge status: Home / Self Care | Payer: Self-pay | Source: Home / Self Care | Attending: Neurosurgery

## 2018-01-25 DIAGNOSIS — S065X9A Traumatic subdural hemorrhage with loss of consciousness of unspecified duration, initial encounter: Secondary | ICD-10-CM | POA: Diagnosis present

## 2018-01-25 DIAGNOSIS — S065XAA Traumatic subdural hemorrhage with loss of consciousness status unknown, initial encounter: Secondary | ICD-10-CM | POA: Diagnosis present

## 2018-01-25 HISTORY — PX: BURR HOLE: SHX908

## 2018-01-25 LAB — PREPARE FRESH FROZEN PLASMA
Unit division: 0
Unit division: 0

## 2018-01-25 LAB — BASIC METABOLIC PANEL
Anion gap: 10 (ref 5–15)
BUN: 9 mg/dL (ref 6–20)
CO2: 22 mmol/L (ref 22–32)
Calcium: 8.6 mg/dL — ABNORMAL LOW (ref 8.9–10.3)
Chloride: 96 mmol/L — ABNORMAL LOW (ref 101–111)
Creatinine, Ser: 0.68 mg/dL (ref 0.61–1.24)
GFR calc Af Amer: >60 mL/min (ref >60)
GFR calc non Af Amer: >60 mL/min (ref >60)
Glucose, Bld: 168 mg/dL — ABNORMAL HIGH (ref 65–99)
Potassium: 3.8 mmol/L (ref 3.5–5.1)
Sodium: 128 mmol/L — ABNORMAL LOW (ref 135–145)

## 2018-01-25 LAB — BPAM FFP
Blood Product Expiration Date: 201903172359
Blood Product Expiration Date: 201903182359
ISSUE DATE / TIME: 201903160942
ISSUE DATE / TIME: 201903161440
Unit Type and Rh: 6200
Unit Type and Rh: 7300

## 2018-01-25 LAB — CBC
HCT: 34.5 % — ABNORMAL LOW (ref 39.0–52.0)
Hemoglobin: 12 g/dL — ABNORMAL LOW (ref 13.0–17.0)
MCH: 31 pg (ref 26.0–34.0)
MCHC: 34.8 g/dL (ref 30.0–36.0)
MCV: 89.1 fL (ref 78.0–100.0)
Platelets: 150 10*3/uL (ref 150–400)
RBC: 3.87 MIL/uL — ABNORMAL LOW (ref 4.22–5.81)
RDW: 13.5 % (ref 11.5–15.5)
WBC: 7.8 10*3/uL (ref 4.0–10.5)

## 2018-01-25 LAB — PROTIME-INR
INR: 1.22
INR: 1.2
Prothrombin Time: 15.3 seconds — ABNORMAL HIGH (ref 11.4–15.2)
Prothrombin Time: 15.1 seconds (ref 11.4–15.2)

## 2018-01-25 LAB — GLUCOSE, CAPILLARY
Glucose-Capillary: 166 mg/dL — ABNORMAL HIGH (ref 65–99)
Glucose-Capillary: 188 mg/dL — ABNORMAL HIGH (ref 65–99)

## 2018-01-25 SURGERY — CREATION, CRANIAL BURR HOLE
Anesthesia: General | Site: Head | Laterality: Bilateral

## 2018-01-25 MED ORDER — CEFAZOLIN SODIUM-DEXTROSE 1-4 GM/50ML-% IV SOLN
1.0000 g | Freq: Three times a day (TID) | INTRAVENOUS | Status: AC
  Administered 2018-01-25 – 2018-01-26 (×2): 1 g via INTRAVENOUS
  Filled 2018-01-25 (×4): qty 50

## 2018-01-25 MED ORDER — SODIUM CHLORIDE 0.9 % IR SOLN
Status: DC | PRN
  Administered 2018-01-25: 500 mL

## 2018-01-25 MED ORDER — CEFAZOLIN SODIUM 1 G IJ SOLR
INTRAMUSCULAR | Status: DC | PRN
  Administered 2018-01-25: 1 g via INTRAMUSCULAR

## 2018-01-25 MED ORDER — LIDOCAINE-EPINEPHRINE 1 %-1:100000 IJ SOLN
INTRAMUSCULAR | Status: DC | PRN
  Administered 2018-01-25: 3 mL via INTRADERMAL

## 2018-01-25 MED ORDER — SENNA 8.6 MG PO TABS
1.0000 | ORAL_TABLET | Freq: Two times a day (BID) | ORAL | Status: DC
  Administered 2018-01-25 – 2018-01-29 (×9): 8.6 mg via ORAL
  Filled 2018-01-25 (×9): qty 1

## 2018-01-25 MED ORDER — ROCURONIUM BROMIDE 10 MG/ML (PF) SYRINGE
PREFILLED_SYRINGE | INTRAVENOUS | Status: AC
  Filled 2018-01-25: qty 5

## 2018-01-25 MED ORDER — DEXAMETHASONE SODIUM PHOSPHATE 10 MG/ML IJ SOLN
INTRAMUSCULAR | Status: AC
  Administered 2018-01-25: 4 mg
  Filled 2018-01-25: qty 1

## 2018-01-25 MED ORDER — ONDANSETRON HCL 4 MG/2ML IJ SOLN
INTRAMUSCULAR | Status: AC
  Filled 2018-01-25: qty 2

## 2018-01-25 MED ORDER — DEXAMETHASONE SODIUM PHOSPHATE 10 MG/ML IJ SOLN
4.0000 mg | Freq: Four times a day (QID) | INTRAMUSCULAR | Status: AC
  Administered 2018-01-26 – 2018-01-27 (×4): 4 mg via INTRAVENOUS
  Filled 2018-01-25 (×4): qty 1

## 2018-01-25 MED ORDER — LIDOCAINE HCL (CARDIAC) 20 MG/ML IV SOLN
INTRAVENOUS | Status: AC
  Filled 2018-01-25: qty 5

## 2018-01-25 MED ORDER — BACITRACIN ZINC 500 UNIT/GM EX OINT
TOPICAL_OINTMENT | CUTANEOUS | Status: DC | PRN
  Administered 2018-01-25: 1 via TOPICAL

## 2018-01-25 MED ORDER — SUGAMMADEX SODIUM 200 MG/2ML IV SOLN
INTRAVENOUS | Status: DC | PRN
  Administered 2018-01-25: 160 mg via INTRAVENOUS

## 2018-01-25 MED ORDER — ACETAMINOPHEN 160 MG/5ML PO SOLN: 325.0000 mg | ORAL | Status: DC | PRN

## 2018-01-25 MED ORDER — MIDAZOLAM HCL 2 MG/2ML IJ SOLN
INTRAMUSCULAR | Status: AC
  Filled 2018-01-25: qty 2

## 2018-01-25 MED ORDER — FENTANYL CITRATE (PF) 250 MCG/5ML IJ SOLN
INTRAMUSCULAR | Status: AC
  Filled 2018-01-25: qty 5

## 2018-01-25 MED ORDER — LEVETIRACETAM IN NACL 500 MG/100ML IV SOLN
500.0000 mg | Freq: Two times a day (BID) | INTRAVENOUS | Status: DC
  Administered 2018-01-25 – 2018-01-29 (×9): 500 mg via INTRAVENOUS
  Filled 2018-01-25 (×12): qty 100

## 2018-01-25 MED ORDER — PROPOFOL 10 MG/ML IV BOLUS
INTRAVENOUS | Status: DC | PRN
  Administered 2018-01-25: 60 mg via INTRAVENOUS
  Administered 2018-01-25: 30 mg via INTRAVENOUS
  Administered 2018-01-25: 20 mg via INTRAVENOUS

## 2018-01-25 MED ORDER — SUCCINYLCHOLINE CHLORIDE 200 MG/10ML IV SOSY
PREFILLED_SYRINGE | INTRAVENOUS | Status: AC
  Filled 2018-01-25: qty 10

## 2018-01-25 MED ORDER — DEXAMETHASONE SODIUM PHOSPHATE 10 MG/ML IJ SOLN
4.0000 mg | Freq: Three times a day (TID) | INTRAMUSCULAR | Status: DC
  Administered 2018-01-27 – 2018-01-28 (×4): 4 mg via INTRAVENOUS
  Filled 2018-01-25 (×4): qty 1

## 2018-01-25 MED ORDER — LACTATED RINGERS IV SOLN: INTRAVENOUS | Status: DC | PRN

## 2018-01-25 MED ORDER — THROMBIN 20000 UNITS EX SOLR
CUTANEOUS | Status: AC
  Filled 2018-01-25: qty 20000

## 2018-01-25 MED ORDER — FENTANYL CITRATE (PF) 100 MCG/2ML IJ SOLN
25.0000 ug | INTRAMUSCULAR | Status: DC | PRN
Start: 2018-01-25 — End: 2018-01-25

## 2018-01-25 MED ORDER — THROMBIN 5000 UNITS EX SOLR
CUTANEOUS | Status: AC
  Filled 2018-01-25: qty 5000

## 2018-01-25 MED ORDER — PHENYLEPHRINE 40 MCG/ML (10ML) SYRINGE FOR IV PUSH (FOR BLOOD PRESSURE SUPPORT)
PREFILLED_SYRINGE | INTRAVENOUS | Status: AC
  Filled 2018-01-25: qty 10

## 2018-01-25 MED ORDER — SODIUM CHLORIDE 0.9 % IV SOLN
INTRAVENOUS | Status: DC
  Administered 2018-01-26: 05:00:00 via INTRAVENOUS

## 2018-01-25 MED ORDER — ACETAMINOPHEN 325 MG PO TABS: 325.0000 mg | ORAL_TABLET | ORAL | Status: DC | PRN

## 2018-01-25 MED ORDER — LABETALOL HCL 5 MG/ML IV SOLN: 10.0000 mg | INTRAVENOUS | Status: DC | PRN

## 2018-01-25 MED ORDER — SODIUM CHLORIDE 0.9 % IV SOLN
INTRAVENOUS | Status: DC | PRN
  Administered 2018-01-25: 07:00:00 via INTRAVENOUS

## 2018-01-25 MED ORDER — SURGIFOAM 100 EX MISC
CUTANEOUS | Status: DC | PRN
  Administered 2018-01-25: 20 mL

## 2018-01-25 MED ORDER — PROPOFOL 10 MG/ML IV BOLUS
INTRAVENOUS | Status: AC
  Filled 2018-01-25: qty 20

## 2018-01-25 MED ORDER — MIDAZOLAM HCL 5 MG/5ML IJ SOLN
INTRAMUSCULAR | Status: DC | PRN
  Administered 2018-01-25: 0.5 mg via INTRAVENOUS

## 2018-01-25 MED ORDER — DEXAMETHASONE SODIUM PHOSPHATE 10 MG/ML IJ SOLN
6.0000 mg | INTRAMUSCULAR | Status: AC
  Administered 2018-01-25 – 2018-01-26 (×4): 6 mg via INTRAVENOUS
  Filled 2018-01-25 (×4): qty 1

## 2018-01-25 MED ORDER — ROCURONIUM BROMIDE 100 MG/10ML IV SOLN
INTRAVENOUS | Status: DC | PRN
  Administered 2018-01-25: 50 mg via INTRAVENOUS

## 2018-01-25 MED ORDER — SUGAMMADEX SODIUM 200 MG/2ML IV SOLN
INTRAVENOUS | Status: AC
  Filled 2018-01-25: qty 2

## 2018-01-25 MED ORDER — FENTANYL CITRATE (PF) 100 MCG/2ML IJ SOLN
INTRAMUSCULAR | Status: DC | PRN
  Administered 2018-01-25: 50 ug via INTRAVENOUS

## 2018-01-25 MED ORDER — VITAMIN K1 10 MG/ML IJ SOLN
5.0000 mg | INTRAVENOUS | Status: AC
  Administered 2018-01-25: 5 mg via INTRAVENOUS
  Filled 2018-01-25: qty 0.5

## 2018-01-25 MED ORDER — LIDOCAINE-EPINEPHRINE 1 %-1:100000 IJ SOLN
INTRAMUSCULAR | Status: AC
  Filled 2018-01-25: qty 1

## 2018-01-25 MED ORDER — MORPHINE SULFATE (PF) 4 MG/ML IV SOLN: 1.0000 mg | INTRAVENOUS | Status: DC | PRN

## 2018-01-25 MED ORDER — OXYCODONE HCL 5 MG PO TABS: 5.0000 mg | ORAL_TABLET | Freq: Once | ORAL | Status: DC | PRN

## 2018-01-25 MED ORDER — DEXAMETHASONE SODIUM PHOSPHATE 10 MG/ML IJ SOLN
INTRAMUSCULAR | Status: AC
  Filled 2018-01-25: qty 1

## 2018-01-25 MED ORDER — DEXAMETHASONE SODIUM PHOSPHATE 4 MG/ML IJ SOLN
4.0000 mg | Freq: Four times a day (QID) | INTRAMUSCULAR | Status: DC
  Administered 2018-01-25: 10 mg via INTRAVENOUS

## 2018-01-25 MED ORDER — PROMETHAZINE HCL 25 MG PO TABS: 12.5000 mg | ORAL_TABLET | ORAL | Status: DC | PRN

## 2018-01-25 MED ORDER — POLYETHYLENE GLYCOL 3350 17 G PO PACK: 17.0000 g | PACK | Freq: Every day | ORAL | Status: DC | PRN

## 2018-01-25 MED ORDER — PHENYLEPHRINE HCL 10 MG/ML IJ SOLN
INTRAVENOUS | Status: DC | PRN
  Administered 2018-01-25: 40 ug/min via INTRAVENOUS

## 2018-01-25 MED ORDER — PHENYLEPHRINE 40 MCG/ML (10ML) SYRINGE FOR IV PUSH (FOR BLOOD PRESSURE SUPPORT)
PREFILLED_SYRINGE | INTRAVENOUS | Status: DC | PRN
  Administered 2018-01-25 (×4): 40 ug via INTRAVENOUS

## 2018-01-25 MED ORDER — DEXAMETHASONE SODIUM PHOSPHATE 10 MG/ML IJ SOLN: 4.0000 mg | Freq: Four times a day (QID) | INTRAMUSCULAR | Status: DC

## 2018-01-25 MED ORDER — OXYCODONE HCL 5 MG/5ML PO SOLN: 5.0000 mg | Freq: Once | ORAL | Status: DC | PRN

## 2018-01-25 MED ORDER — 0.9 % SODIUM CHLORIDE (POUR BTL) OPTIME
TOPICAL | Status: DC | PRN
  Administered 2018-01-25 (×3): 1000 mL

## 2018-01-25 MED ORDER — BACITRACIN ZINC 500 UNIT/GM EX OINT
TOPICAL_OINTMENT | CUTANEOUS | Status: AC
  Filled 2018-01-25: qty 28.35

## 2018-01-25 MED ORDER — DOCUSATE SODIUM 100 MG PO CAPS
100.0000 mg | ORAL_CAPSULE | Freq: Two times a day (BID) | ORAL | Status: DC
  Administered 2018-01-25 – 2018-01-29 (×9): 100 mg via ORAL
  Filled 2018-01-25 (×9): qty 1

## 2018-01-25 SURGICAL SUPPLY — 81 items
BAG DECANTER FOR FLEXI CONT (MISCELLANEOUS) ×2 IMPLANT
BANDAGE GAUZE 4  KLING STR (GAUZE/BANDAGES/DRESSINGS) IMPLANT
BLADE CLIPPER SURG (BLADE) ×2 IMPLANT
BLADE SURG 11 STRL SS (BLADE) ×2 IMPLANT
BNDG COHESIVE 4X5 TAN NS LF (GAUZE/BANDAGES/DRESSINGS) IMPLANT
BUR ACORN 9.0 PRECISION (BURR) ×2 IMPLANT
CABLE BIPOLOR RESECTION CORD (MISCELLANEOUS) ×2 IMPLANT
CANISTER SUCT 3000ML PPV (MISCELLANEOUS) ×2 IMPLANT
CARTRIDGE OIL MAESTRO DRILL (MISCELLANEOUS) ×1 IMPLANT
CATH ROBINSON RED A/P 12FR (CATHETERS) ×2 IMPLANT
CATH VENTRIC 35X38 W/TROCAR LG (CATHETERS) ×2 IMPLANT
CLIP VESOCCLUDE MED 6/CT (CLIP) IMPLANT
DECANTER SPIKE VIAL GLASS SM (MISCELLANEOUS) ×2 IMPLANT
DERMABOND ADVANCED (GAUZE/BANDAGES/DRESSINGS) ×2
DERMABOND ADVANCED .7 DNX12 (GAUZE/BANDAGES/DRESSINGS) ×2 IMPLANT
DIFFUSER DRILL AIR PNEUMATIC (MISCELLANEOUS) ×2 IMPLANT
DRAIN BAG CSF ACCUDRAIN (MISCELLANEOUS) ×2 IMPLANT
DRAPE NEUROLOGICAL W/INCISE (DRAPES) ×2 IMPLANT
DRAPE SURG 17X23 STRL (DRAPES) IMPLANT
DRAPE WARM FLUID 44X44 (DRAPE) ×2 IMPLANT
DRSG OPSITE POSTOP 3X4 (GAUZE/BANDAGES/DRESSINGS) ×6 IMPLANT
ELECT CAUTERY BLADE 6.4 (BLADE) ×2 IMPLANT
ELECT REM PT RETURN 9FT ADLT (ELECTROSURGICAL) ×2
ELECTRODE REM PT RTRN 9FT ADLT (ELECTROSURGICAL) ×1 IMPLANT
GAUZE SPONGE 4X4 12PLY STRL (GAUZE/BANDAGES/DRESSINGS) IMPLANT
GAUZE SPONGE 4X4 16PLY XRAY LF (GAUZE/BANDAGES/DRESSINGS) IMPLANT
GLOVE BIO SURGEON STRL SZ 6.5 (GLOVE) IMPLANT
GLOVE BIO SURGEON STRL SZ7 (GLOVE) ×4 IMPLANT
GLOVE BIO SURGEON STRL SZ7.5 (GLOVE) IMPLANT
GLOVE BIO SURGEON STRL SZ8 (GLOVE) ×2 IMPLANT
GLOVE BIO SURGEON STRL SZ8.5 (GLOVE) IMPLANT
GLOVE BIOGEL M 8.0 STRL (GLOVE) IMPLANT
GLOVE BIOGEL PI IND STRL 7.0 (GLOVE) IMPLANT
GLOVE BIOGEL PI IND STRL 7.5 (GLOVE) ×1 IMPLANT
GLOVE BIOGEL PI INDICATOR 7.0 (GLOVE)
GLOVE BIOGEL PI INDICATOR 7.5 (GLOVE) ×1
GLOVE ECLIPSE 6.5 STRL STRAW (GLOVE) IMPLANT
GLOVE ECLIPSE 7.0 STRL STRAW (GLOVE) IMPLANT
GLOVE ECLIPSE 7.5 STRL STRAW (GLOVE) IMPLANT
GLOVE ECLIPSE 8.0 STRL XLNG CF (GLOVE) IMPLANT
GLOVE ECLIPSE 8.5 STRL (GLOVE) IMPLANT
GLOVE EXAM NITRILE LRG STRL (GLOVE) IMPLANT
GLOVE EXAM NITRILE XL STR (GLOVE) IMPLANT
GLOVE EXAM NITRILE XS STR PU (GLOVE) IMPLANT
GLOVE INDICATOR 6.5 STRL GRN (GLOVE) IMPLANT
GLOVE INDICATOR 7.0 STRL GRN (GLOVE) ×2 IMPLANT
GLOVE INDICATOR 7.5 STRL GRN (GLOVE) IMPLANT
GLOVE INDICATOR 8.0 STRL GRN (GLOVE) IMPLANT
GLOVE INDICATOR 8.5 STRL (GLOVE) ×2 IMPLANT
GLOVE OPTIFIT SS 8.0 STRL (GLOVE) IMPLANT
GLOVE SURG SS PI 6.5 STRL IVOR (GLOVE) IMPLANT
GLOVE SURG SS PI 7.0 STRL IVOR (GLOVE) ×2 IMPLANT
GOWN STRL REUS W/ TWL LRG LVL3 (GOWN DISPOSABLE) ×2 IMPLANT
GOWN STRL REUS W/ TWL XL LVL3 (GOWN DISPOSABLE) ×1 IMPLANT
GOWN STRL REUS W/TWL 2XL LVL3 (GOWN DISPOSABLE) ×2 IMPLANT
GOWN STRL REUS W/TWL LRG LVL3 (GOWN DISPOSABLE) ×4
GOWN STRL REUS W/TWL XL LVL3 (GOWN DISPOSABLE) ×2
HEMOSTAT SURGICEL 2X14 (HEMOSTASIS) IMPLANT
HOOK DURA (MISCELLANEOUS) IMPLANT
KIT BASIN OR (CUSTOM PROCEDURE TRAY) ×2 IMPLANT
KIT ROOM TURNOVER OR (KITS) ×2 IMPLANT
NEEDLE HYPO 25X1 1.5 SAFETY (NEEDLE) ×2 IMPLANT
NS IRRIG 1000ML POUR BTL (IV SOLUTION) ×6 IMPLANT
OIL CARTRIDGE MAESTRO DRILL (MISCELLANEOUS) ×2
PACK CRANIOTOMY CUSTOM (CUSTOM PROCEDURE TRAY) ×2 IMPLANT
PAD ARMBOARD 7.5X6 YLW CONV (MISCELLANEOUS) ×6 IMPLANT
PATTIES SURGICAL .25X.25 (GAUZE/BANDAGES/DRESSINGS) IMPLANT
PATTIES SURGICAL .5 X.5 (GAUZE/BANDAGES/DRESSINGS) IMPLANT
PATTIES SURGICAL .5 X3 (DISPOSABLE) IMPLANT
PATTIES SURGICAL 1X1 (DISPOSABLE) IMPLANT
PIN MAYFIELD SKULL DISP (PIN) IMPLANT
SPONGE NEURO XRAY DETECT 1X3 (DISPOSABLE) IMPLANT
SPONGE SURGIFOAM ABS GEL 100 (HEMOSTASIS) IMPLANT
STAPLER VISISTAT 35W (STAPLE) ×2 IMPLANT
SUT NURALON 4 0 TR CR/8 (SUTURE) ×2 IMPLANT
SUT VIC AB 2-0 CT1 18 (SUTURE) ×4 IMPLANT
SYR CONTROL 10ML LL (SYRINGE) ×2 IMPLANT
TOWEL GREEN STERILE (TOWEL DISPOSABLE) ×2 IMPLANT
TOWEL GREEN STERILE FF (TOWEL DISPOSABLE) ×2 IMPLANT
TRAY FOLEY W/METER SILVER 16FR (SET/KITS/TRAYS/PACK) ×2 IMPLANT
WATER STERILE IRR 1000ML POUR (IV SOLUTION) ×2 IMPLANT

## 2018-01-25 NOTE — Anesthesia Postprocedure Evaluation (Signed)
Anesthesia Post Note  Patient: Ryan Savage  Procedure(s) Performed: BURR HOLES (Bilateral Head)     Patient location during evaluation: PACU Anesthesia Type: General Level of consciousness: awake Pain management: pain level controlled Vital Signs Assessment: post-procedure vital signs reviewed and stable Respiratory status: spontaneous breathing, nonlabored ventilation, respiratory function stable and patient connected to nasal cannula oxygen Cardiovascular status: blood pressure returned to baseline and stable Postop Assessment: no apparent nausea or vomiting Anesthetic complications: no    Last Vitals:  Vitals:   01/25/18 0956 01/25/18 1000  BP: 128/77 124/80  Pulse: 97 94  Resp: 18 (!) 22  Temp:    SpO2: 95% 97%    Last Pain:  Vitals:   01/25/18 0915  TempSrc:   PainSc: 0-No pain                 Myrtle Barnhard

## 2018-01-25 NOTE — Transfer of Care (Signed)
Immediate Anesthesia Transfer of Care Note  Patient: Ryan Savage  Procedure(s) Performed: Trudee Kuster HOLES (Bilateral Head)  Patient Location: PACU  Anesthesia Type:General  Level of Consciousness: awake, alert , oriented and patient cooperative  Airway & Oxygen Therapy: Patient Spontanous Breathing and Patient connected to nasal cannula oxygen  Post-op Assessment: Report given to RN and Post -op Vital signs reviewed and stable  Post vital signs: Reviewed  Last Vitals: 115/72, 96, 18, 100% Vitals:   01/25/18 0445 01/25/18 0635  BP: 117/64 128/63  Pulse:    Resp:    Temp: 36.6 C 36.4 C  SpO2:  100%    Last Pain:  Vitals:   01/25/18 0635  TempSrc: Oral  PainSc: 0-No pain         Complications: No apparent anesthesia complications

## 2018-01-25 NOTE — Anesthesia Procedure Notes (Signed)
Procedure Name: Intubation Date/Time: 01/25/2018 7:40 AM Performed by: Sammie Bench, CRNA Pre-anesthesia Checklist: Patient identified, Emergency Drugs available, Suction available and Patient being monitored Patient Re-evaluated:Patient Re-evaluated prior to induction Oxygen Delivery Method: Circle System Utilized Preoxygenation: Pre-oxygenation with 100% oxygen Induction Type: IV induction Ventilation: Mask ventilation without difficulty Laryngoscope Size: Glidescope and 3 Grade View: Grade I Tube type: Oral Tube size: 8.0 mm Number of attempts: 1 Airway Equipment and Method: Stylet,  Oral airway and Video-laryngoscopy Placement Confirmation: ETT inserted through vocal cords under direct vision,  positive ETCO2 and breath sounds checked- equal and bilateral Secured at: 24 cm Tube secured with: Tape Dental Injury: Teeth and Oropharynx as per pre-operative assessment

## 2018-01-25 NOTE — Progress Notes (Signed)
Patient ID: Ryan Savage, male   DOB: [birth year], 84 y.o.   MRN: [id number] Patient remains doing well with minimal headache  Awake alert oriented 3 strength 5 out of 5 with no pronator drift.  INR early this morning 1.22.  Patient now is 45 hours out from his most recent INR status should be 1.2 or less which in my opinion it safe to do bur hole craniectomies as the patient does have significant midline shift and edema. I've extensively gone over the risks and benefits of bilateral bur holes with the patient and his wife including the possible conversion into a craniotomy. I've gone over perioperative course expectations of outcome and alternatives of surgery and they understand and agreed to proceed forward.

## 2018-01-25 NOTE — Anesthesia Preprocedure Evaluation (Addendum)
Anesthesia Evaluation  Patient identified by MRN, date of birth, ID band Patient awake    Reviewed: Allergy & Precautions, NPO status , Patient's Chart, lab work & pertinent test results, reviewed documented beta blocker date and time   History of Anesthesia Complications Negative for: history of anesthetic complications  Airway Mallampati: III  TM Distance: >3 FB Neck ROM: Full    Dental  (+) Teeth Intact   Pulmonary sleep apnea , former smoker,    breath sounds clear to auscultation       Cardiovascular hypertension, Pt. on medications and Pt. on home beta blockers (-) angina+ [name], + Peripheral Vascular Disease and +CHF  + dysrhythmias Atrial Fibrillation  Rhythm:Irregular     Neuro/Psych Left leg weakness CVA, Residual Symptoms negative psych ROS   GI/Hepatic negative GI ROS, Neg liver ROS,   Endo/Other    Renal/GU negative Renal ROS     Musculoskeletal   Abdominal   Peds  Hematology   Anesthesia Other Findings Left ventricle: The cavity size was normal. Systolic function was   moderately to severely reduced. The estimated ejection fraction   was in the range of 30% to 35%. Hypokinesis of the inferior,   mid-apical anteroseptal, inferoseptal, apical anterior, and   apical myocardium. - Aortic valve: Transvalvular velocity was within the normal range.   There was no stenosis. There was no regurgitation. - Mitral valve: Trivial prolapse, involving the posterior leaflet.   Transvalvular velocity was within the normal range. There was no   evidence for stenosis. There was mild regurgitation. - Left atrium: The atrium was severely dilated. - Right ventricle: The cavity size was normal. Wall thickness was   normal. Systolic function was normal. - Right atrium: The atrium was moderately dilated. - Tricuspid valve: There was moderate regurgitation. - Pulmonary arteries: Systolic pressure was within the normal    range. PA peak pressure: 32 mm Hg (S).  1. 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 Patent left main and circumflex/intermediate branches   Reproductive/Obstetrics                            Anesthesia Physical Anesthesia Plan  ASA: III  Anesthesia Plan: General   Post-op Pain Management:    Induction: Intravenous  PONV Risk Score and Plan: 2 and Ondansetron and Treatment may vary due to age or medical condition  Airway Management Planned: Oral ETT  Additional Equipment: None  Intra-op Plan:   Post-operative Plan: Extubation in OR  Informed Consent: I have reviewed the patients History and Physical, chart, labs and discussed the procedure including the risks, benefits and alternatives for the proposed anesthesia with the patient or authorized representative who has indicated his/her understanding and acceptance.   Dental advisory given  Plan Discussed with: CRNA and Surgeon  Anesthesia Plan Comments:         Anesthesia Quick Evaluation

## 2018-01-25 NOTE — Progress Notes (Signed)
Pt ready for Pick up for OR ,remained NPO decadron PO change  To IV  No acute distress .

## 2018-01-25 NOTE — Progress Notes (Signed)
   Patient went to OR early this morning prior to my evaluation for bilateral burr holes. At this time, he remains in the ICU post-operatively. Hospitalist service will sign off at this time. If services needed, please contact flow manager at [phone].   Dessa Phi, DO [facility] [email or web address] Password [email or web address] 01/25/2018, 10:04 AM

## 2018-01-25 NOTE — Op Note (Signed)
Preoperative diagnosis: Bilateral subacute subdural hematomas  Postoperative diagnosis: Same  Procedure: Bilateral bur holes for evacuation of subacute to chronic subdural hematomas. 2 bur holes on the right side one frontally 1 parietally 1 posterior frontal burr hole in the left side.  Surgeon: Dominica Severin Gilma Bessette  Asst.: Glenford Peers  Anesthesia: Gen.  EBL: Minimal  History of present illness: 59 she'll gentleman presented to the ER with altered mental status and falls workup revealed large bilateral subacute subdural hematomas. Patient was on warfarin he was reversed from his coagulopathy and taken to the OR for evacuation. We've extensively reviewed the risks and benefits of the operation the patient as well as perioperative course expectations of outcome alternatives is surgery he understood and agreed to proceed forward.  Operative procedure: This is brought into the or was discharged on general anesthesia positioned supine the head in slight flexion on the horseshoe the superior temporal line was of each side was shaved prepped and draped in routine sterile fashion 2 incisions were drawn out and infiltrated with lidocaine and incised 3 bur holes were drilled 1 posterior frontal and left one frontal and parietal in the right the dura was then coagulated and incised in a cruciate fashion at all 3 holes. First working on the right side the frontal burr hole was opened up and a lot of old blood came out under pressure. Then I opened up the parietal hole this was more acute-appearing blood but was still liquid form. I irrigated copiously from one hole the next utilizing a red rubber catheter and got out and significant amount of mixture of more subacute to acute-appearing blood versus chronic. However I do to get visualization of the cortex did not feel like there was any solid clot residua so therefore I did not feel like we did turn a craniotomy flap there. I did place a ventricular catheter from the  parietal hole to the frontal hole in so that placed. Then while irrigating both parietal and frontal holes were closed with interrupted Vicryl's and staples. Then the left frontal burr hole was incised and irrigated copiously a combination of chronic and subacute blood was easily irrigated out without holes well and then I oversewed this hole while irrigating with interrupted Vicryl and staples as well. All incisions were dressed patient recovered in stable condition. At the case on it counts sponge counts were correct.

## 2018-01-26 ENCOUNTER — Inpatient Hospital Stay (HOSPITAL_COMMUNITY): Payer: Medicare Other

## 2018-01-26 ENCOUNTER — Encounter (HOSPITAL_COMMUNITY): Payer: Self-pay | Admitting: Neurosurgery

## 2018-01-26 ENCOUNTER — Ambulatory Visit: Payer: Self-pay | Admitting: Adult Health

## 2018-01-26 LAB — GLUCOSE, CAPILLARY
Glucose-Capillary: 150 mg/dL — ABNORMAL HIGH (ref 65–99)
Glucose-Capillary: 192 mg/dL — ABNORMAL HIGH (ref 65–99)

## 2018-01-26 LAB — CBC
HCT: 33.8 % — ABNORMAL LOW (ref 39.0–52.0)
Hemoglobin: 11.3 g/dL — ABNORMAL LOW (ref 13.0–17.0)
MCH: 30.5 pg (ref 26.0–34.0)
MCHC: 33.4 g/dL (ref 30.0–36.0)
MCV: 91.4 fL (ref 78.0–100.0)
Platelets: 159 10*3/uL (ref 150–400)
RBC: 3.7 MIL/uL — ABNORMAL LOW (ref 4.22–5.81)
RDW: 14 % (ref 11.5–15.5)
WBC: 12.4 10*3/uL — ABNORMAL HIGH (ref 4.0–10.5)

## 2018-01-26 MED ORDER — BACITRACIN-NEOMYCIN-POLYMYXIN OINTMENT TUBE
TOPICAL_OINTMENT | Freq: Every day | CUTANEOUS | Status: DC
  Administered 2018-01-27: 1 via TOPICAL
  Administered 2018-01-28 – 2018-01-29 (×2): via TOPICAL
  Filled 2018-01-26 (×2): qty 1

## 2018-01-26 MED ORDER — BACITRACIN-NEOMYCIN-POLYMYXIN OINTMENT TUBE
TOPICAL_OINTMENT | Freq: Every day | CUTANEOUS | Status: DC
  Filled 2018-01-26: qty 14.17

## 2018-01-26 MED ORDER — IOPAMIDOL (ISOVUE-370) INJECTION 76%
INTRAVENOUS | Status: AC
  Filled 2018-01-26: qty 50

## 2018-01-26 NOTE — Progress Notes (Signed)
NEUROSURGERY PROGRESS NOTE  Doing well. Complains of appropriate head soreness. Good strength and sensation Incision CDI  Temp:  [97.4 F (36.3 C)-98.7 F (37.1 C)] 97.4 F (36.3 C) (03/18 0800) Pulse Rate:  [56-92] 89 (03/18 0900) Resp:  [15-25] 20 (03/18 0900) BP: (103-126)/(54-93) 119/82 (03/18 0900) SpO2:  [86 %-100 %] 99 % (03/18 0900) Weight:  [78.1 kg (172 lb 2.9 oz)] 78.1 kg (172 lb 2.9 oz) (03/18 0500)  Plan: CT stable from this morning. Will continue drain until tomorrow.   Eleonore Chiquito, NP 01/26/2018 10:28 AM

## 2018-01-26 NOTE — Progress Notes (Signed)
PT Cancellation Note  Patient Details Name: Ryan Savage MRN: [id number] DOB: 1928-11-30   Cancelled Treatment:    Reason Eval/Treat Not Completed: Active bedrest order in place (strict) at this time.   Duncan Dull 01/26/2018, 2:56 PM Alben Deeds, PT DPT  Board Certified Neurologic Specialist [phone]

## 2018-01-26 NOTE — Plan of Care (Signed)
Patient coping well, in good spirits, voiding using external catheter without difficulty.

## 2018-01-27 LAB — GLUCOSE, CAPILLARY
Glucose-Capillary: 147 mg/dL — ABNORMAL HIGH (ref 65–99)
Glucose-Capillary: 200 mg/dL — ABNORMAL HIGH (ref 65–99)

## 2018-01-27 NOTE — Evaluation (Signed)
Physical Therapy Evaluation Patient Details Name: Ryan Savage MRN: [id number] DOB: 1929/08/26 Today's Date: 01/27/2018   History of Present Illness  Ryan Savage is a 84 y.o. male with past medical history significant for chronic atrial fib, on coumadin, CHF, recent hx CVA, SIADH, OSA, HTN, HLD who presents with left leg weakness. He states that he is able to walk, but feeling like his left leg is significantly weak and off balance. In the ED, CT head revealed bilateral subdual hematomas with 63mm right to left shift. Underwent burr holes for evacuation on 01/25/18.  Clinical Impression  Pt admitted with above diagnosis. Pt currently with functional limitations due to the deficits listed below (see PT Problem List). Pt ambulating with min-guard A with full strength LLE. RW used as pt was very hesitant to put wt on L side. Significant word finding issues that were worsened with multitasking.  Pt will benefit from skilled PT to increase their independence and safety with mobility to allow discharge to the venue listed below.       Follow Up Recommendations Outpatient PT;Supervision for mobility/OOB    Equipment Recommendations  None recommended by PT    Recommendations for Other Services       Precautions / Restrictions Precautions Precautions: Fall Restrictions Weight Bearing Restrictions: No      Mobility  Bed Mobility Overal bed mobility: Needs Assistance Bed Mobility: Supine to Sit     Supine to sit: Supervision     General bed mobility comments: pt able to get to EOB without physical assist  Transfers Overall transfer level: Needs assistance Equipment used: Rolling walker (2 wheeled) Transfers: Sit to/from Stand Sit to Stand: Min guard         General transfer comment: min-guard for safety, pt hesitant to put weight on LLE but was able to do so without buckling.   Ambulation/Gait Ambulation/Gait assistance: Min guard Ambulation Distance (Feet): 100  Feet Assistive device: Rolling walker (2 wheeled) Gait Pattern/deviations: Step-through pattern;Decreased stride length Gait velocity: decreased Gait velocity interpretation: <1.8 ft/sec, indicative of risk for recurrent falls General Gait Details: pt still hesitant with L side WB'ing but no weakness noted L with use of RW. Will likely progress off RW quickly again.   Stairs            Wheelchair Mobility    Modified Rankin (Stroke Patients Only) Modified Rankin (Stroke Patients Only) Pre-Morbid Rankin Score: Moderate disability Modified Rankin: Moderately severe disability     Balance Overall balance assessment: Needs assistance Sitting-balance support: Single extremity supported Sitting balance-Leahy Scale: Good     Standing balance support: Bilateral upper extremity supported Standing balance-Leahy Scale: Fair Standing balance comment: pt able to stand with unilateral support. Can progress to no UE support as pt gains confidence in strength of LLE                             Pertinent Vitals/Pain Pain Assessment: Faces Faces Pain Scale: No hurt Pain Intervention(s): Monitored during session    Home Living Family/patient expects to be discharged to:: Private residence Living Arrangements: Spouse/significant other Available Help at Discharge: Family;Available 24 hours/day Type of Home: House Home Access: Stairs to enter Entrance Stairs-Rails: Right Entrance Stairs-Number of Steps: 3 + 3 Home Layout: One level Home Equipment: Walker - standard;Bedside commode Additional Comments: Recent admission in Jan    Prior Function Level of Independence: Needs assistance   Gait / Transfers Assistance Needed: Since recent admission,  pt was been walking with and without AD and increased his mobility to walks in the park.   ADL's / Homemaking Assistance Needed: He required assistance from his wife for LB ADLs due to LLE weakness.   Comments: began falling PTA  due to LLE weakness     Hand Dominance   Dominant Hand: Right    Extremity/Trunk Assessment   Upper Extremity Assessment Upper Extremity Assessment: Defer to OT evaluation    Lower Extremity Assessment Lower Extremity Assessment: Overall WFL for tasks assessed    Cervical / Trunk Assessment Cervical / Trunk Assessment: Normal  Communication   Communication: Expressive difficulties  Cognition Arousal/Alertness: Awake/alert Behavior During Therapy: WFL for tasks assessed/performed Overall Cognitive Status: Impaired/Different from baseline Area of Impairment: Memory;Problem solving;Following commands                     Memory: Decreased short-term memory Following Commands: Follows multi-step commands with increased time;Follows one step commands with increased time     Problem Solving: Slow processing;Requires verbal cues General Comments: Pt with decreased ST memory recalling 1/3 ST memory words. Required categorical cues for remaining words. Pt requiring increased cues during strength testing to follow directions (ie lifting left leg when asked to lift right).       General Comments General comments (skin integrity, edema, etc.): pt with significant word finding issues and stuttering on eval. Worsened from resting in bed to performing mobility. Pt frustrated by this. Cued him to not try to talk while mobilizing and work on speech when he was once again seated at rest.     Exercises     Assessment/Plan    PT Assessment Patient needs continued PT services  PT Problem List Decreased strength;Decreased activity tolerance;Decreased balance;Decreased mobility;Decreased knowledge of use of DME;Decreased knowledge of precautions;Decreased cognition       PT Treatment Interventions DME instruction;Gait training;Stair training;Functional mobility training;Therapeutic activities;Therapeutic exercise;Balance training;Patient/family education;Cognitive  remediation;Neuromuscular re-education    PT Goals (Current goals can be found in the Care Plan section)  Acute Rehab PT Goals Patient Stated Goal:  return home, get speech back to normal PT Goal Formulation: With patient Time For Goal Achievement: 02/10/18 Potential to Achieve Goals: Good    Frequency Min 3X/week   Barriers to discharge        Co-evaluation PT/OT/SLP Co-Evaluation/Treatment: Yes Reason for Co-Treatment: Complexity of the patient's impairments (multi-system involvement);Necessary to address cognition/behavior during functional activity PT goals addressed during session: Mobility/safety with mobility;Balance;Proper use of DME OT goals addressed during session: ADL's and self-care       AM-PAC PT "6 Clicks" Daily Activity  Outcome Measure Difficulty turning over in bed (including adjusting bedclothes, sheets and blankets)?: A Little Difficulty moving from lying on back to sitting on the side of the bed? : A Little Difficulty sitting down on and standing up from a chair with arms (e.g., wheelchair, bedside commode, etc,.)?: A Little Help needed moving to and from a bed to chair (including a wheelchair)?: A Little Help needed walking in hospital room?: A Little Help needed climbing 3-5 steps with a railing? : A Little 6 Click Score: 18    End of Session Equipment Utilized During Treatment: Gait belt Activity Tolerance: Patient tolerated treatment well Patient left: in chair;with call bell/phone within reach;with chair alarm set;with family/visitor present Nurse Communication: Mobility status PT Visit Diagnosis: Unsteadiness on feet (R26.81);Repeated falls (R29.6)    Time: 6644-0347 PT Time Calculation (min) (ACUTE ONLY): 27 min   Charges:  PT Evaluation $PT Eval Moderate Complexity: 1 Mod     PT G Codes:        Leighton Roach, PT  Acute Rehab Services  [place] 01/27/2018, 10:20 AM

## 2018-01-27 NOTE — Care Management Note (Signed)
Case Management Note  Patient Details  Name: Ryan Savage MRN: [id number] Date of Birth: 12/29/1928  Subjective/Objective:  Pt admitted on 01/23/18 with bilateral subdual hematomas with 25mm right to left shift. Underwent burr holes for evacuation on 01/25/18.  PTA, pt independent, lives at home with spouse.                  Action/Plan: PT/OT recommending OP follow up.  Will make referrals to [facility] prior to dc.     Expected Discharge Date:                  Expected Discharge Plan:  OP Rehab  In-House Referral:     Discharge planning Services  CM Consult  Post Acute Care Choice:    Choice offered to:     DME Arranged:    DME Agency:     HH Arranged:    HH Agency:     Status of Service:  In process, will continue to follow  If discussed at Long Length of Stay Meetings, dates discussed:    Additional Comments:  Reinaldo Raddle, RN, BSN  Trauma/Neuro ICU Case Manager [phone]

## 2018-01-27 NOTE — Care Management Important Message (Signed)
Important Message  Patient Details  Name: Ryan Savage MRN: [id number] Date of Birth: 1929/07/25   Medicare Important Message Given:  Yes    Sophee Mckimmy 01/27/2018, 3:46 PM

## 2018-01-27 NOTE — Evaluation (Signed)
Occupational Therapy Evaluation Patient Details Name: Ryan Savage MRN: [id number] DOB: 29-Apr-1929 Today's Date: 01/27/2018    History of Present Illness Ryan Savage is a 84 y.o. male with past medical history significant for chronic atrial fib, on coumadin, CHF, recent hx CVA, SIADH, OSA, HTN, HLD who presents with left leg weakness. He states that he is able to walk, but feeling like his left leg is significantly weak and off balance. In the ED, CT head revealed bilateral subdual hematomas with 26mm right to left shift. Underwent burr holes for evacuation on 01/25/18.   Clinical Impression   PTA, pt living with his wife and performing BADLs with as needed assistance from wife for LB ADLs due to LLE weakness. Pt currently performing LB ADLs with Mod A to prevent forward head lean and Min Guard A for functional mobility with RW. Pt presenting decreased word finding and cognition as seen with decreased memory and problem solving. Pt would benefit from further acute OT to facilitate safe dc. Recommend dc to home with follow up at neuro OP OT for further OT to optimize independent with ADLs and IADLs as well as return to PLOF.      Follow Up Recommendations  Outpatient OT(Neuro OP OT )    Equipment Recommendations  None recommended by OT    Recommendations for Other Services PT consult;Speech consult     Precautions / Restrictions Precautions Precautions: Fall Restrictions Weight Bearing Restrictions: No      Mobility Bed Mobility Overal bed mobility: Needs Assistance Bed Mobility: Supine to Sit     Supine to sit: Supervision     General bed mobility comments: pt able to get to EOB without physical assist  Transfers Overall transfer level: Needs assistance Equipment used: Rolling walker (2 wheeled) Transfers: Sit to/from Stand Sit to Stand: Min guard         General transfer comment: min-guard for safety, pt hesitant to put weight on LLE but was able to do so without  buckling.     Balance Overall balance assessment: Needs assistance Sitting-balance support: Single extremity supported Sitting balance-Leahy Scale: Good     Standing balance support: Bilateral upper extremity supported Standing balance-Leahy Scale: Fair Standing balance comment: pt able to stand with unilateral support. Can progress to no UE support as pt gains confidence in strength of LLE                           ADL either performed or assessed with clinical judgement   ADL Overall ADL's : Needs assistance/impaired Eating/Feeding: Set up;Sitting   Grooming: Min guard;Standing   Upper Body Bathing: Set up;Sitting;Supervision/ safety   Lower Body Bathing: Sit to/from stand;Minimal assistance   Upper Body Dressing : Set up;Supervision/safety;Sitting Upper Body Dressing Details (indicate cue type and reason): donned gown like jacket Lower Body Dressing: Moderate assistance;Sit to/from stand Lower Body Dressing Details (indicate cue type and reason): Mod A for donning pants to prevent bending forward. Pt able to bring ankles to knees for donning shoes. Educating pt on preventing bending forward for decreasing pressure and pain.  Toilet Transfer: Min guard;Ambulation;RW(Simulated to recliner)           Functional mobility during ADLs: Min guard;Rolling walker       Vision Baseline Vision/History: Wears glasses Wears Glasses: At all times Additional Comments: Need further assessment     Perception     Praxis      Pertinent Vitals/Pain Pain Assessment: Faces  Faces Pain Scale: No hurt Pain Intervention(s): Monitored during session     Hand Dominance Right   Extremity/Trunk Assessment Upper Extremity Assessment Upper Extremity Assessment: Overall WFL for tasks assessed   Lower Extremity Assessment Lower Extremity Assessment: Overall WFL for tasks assessed   Cervical / Trunk Assessment Cervical / Trunk Assessment: Normal   Communication  Communication Communication: Expressive difficulties   Cognition Arousal/Alertness: Awake/alert Behavior During Therapy: WFL for tasks assessed/performed Overall Cognitive Status: Impaired/Different from baseline Area of Impairment: Memory;Problem solving;Following commands                     Memory: Decreased short-term memory Following Commands: Follows multi-step commands with increased time;Follows one step commands with increased time     Problem Solving: Slow processing;Requires verbal cues General Comments: Pt with decreased ST memory recalling 1/3 ST memory words. Required categorical cues for remaining words. Pt requiring increased cues during strength testing to follow directions (ie lifting left leg when asked to lift right).    General Comments  pt with significant word finding issues and stuttering on eval. Worsened from resting in bed to performing mobility. Pt frustrated by this. Cued him to not try to talk while mobilizing and work on speech when he was once again seated at rest.     Exercises     Shoulder Instructions      Home Living Family/patient expects to be discharged to:: Private residence Living Arrangements: Spouse/significant other Available Help at Discharge: Family;Available 24 hours/day Type of Home: House Home Access: Stairs to enter [employer] of Steps: 3 + 3 Entrance Stairs-Rails: Right Home Layout: One level     Bathroom Shower/Tub: Walk-in shower;Door   [employer] Toilet: Standard     Home Equipment: Walker - standard;Bedside commode   Additional Comments: Recent admission in Jan      Prior Functioning/Environment Level of Independence: Needs assistance  Gait / Transfers Assistance Needed: Since recent admission, pt was been walking with and without AD and increased his mobility to walks in the park.  ADL's / Homemaking Assistance Needed: He required assistance from his wife for LB ADLs due to LLE weakness.     Comments: began falling PTA due to LLE weakness        OT Problem List: Decreased range of motion;Decreased activity tolerance;Impaired balance (sitting and/or standing);Decreased cognition;Decreased knowledge of use of DME or AE;Decreased knowledge of precautions      OT Treatment/Interventions: Self-care/ADL training;Therapeutic exercise;Energy conservation;DME and/or AE instruction;Therapeutic activities;Patient/family education    OT Goals(Current goals can be found in the care plan section) Acute Rehab OT Goals Patient Stated Goal:  return home, get speech back to normal OT Goal Formulation: With patient Time For Goal Achievement: 02/10/18 Potential to Achieve Goals: Good ADL Goals Pt Will Perform Lower Body Dressing: (P) with set-up;sit to/from stand;with adaptive equipment;with caregiver independent in assisting Additional ADL Goal #1: (P) Pt will perform three step trail making task with 1-2 verbal cues  OT Frequency: Min 2X/week   Barriers to D/C:            Co-evaluation PT/OT/SLP Co-Evaluation/Treatment: Yes Reason for Co-Treatment: Complexity of the patient's impairments (multi-system involvement);Necessary to address cognition/behavior during functional activity PT goals addressed during session: Mobility/safety with mobility;Balance;Proper use of DME OT goals addressed during session: ADL's and self-care      AM-PAC PT "6 Clicks" Daily Activity     Outcome Measure Help from another person eating meals?: None Help from another person taking care of  personal grooming?: A Little Help from another person toileting, which includes using toliet, bedpan, or urinal?: A Little Help from another person bathing (including washing, rinsing, drying)?: A Little Help from another person to put on and taking off regular upper body clothing?: A Little Help from another person to put on and taking off regular lower body clothing?: A Lot 6 Click Score: 18   End of Session  Equipment Utilized During Treatment: Gait belt;Rolling walker Nurse Communication: Mobility status  Activity Tolerance: Patient tolerated treatment well Patient left: in chair;with call bell/phone within reach;with chair alarm set  OT Visit Diagnosis: Unsteadiness on feet (R26.81);Other abnormalities of gait and mobility (R26.89);Muscle weakness (generalized) (M62.81);Other symptoms and signs involving cognitive function                Time: 3716-9678 OT Time Calculation (min): 26 min Charges:  OT General Charges $OT Visit: 1 Visit OT Evaluation $OT Eval Moderate Complexity: 1 Mod G-Codes:     Priyah Schmuck MSOT, OTR/L Acute Rehab Pager: [phone] Office: [place] 01/27/2018, 10:32 AM

## 2018-01-27 NOTE — Progress Notes (Signed)
Patient ID: Ryan Savage, male   DOB: 06/02/1929, 84 y.o.   MRN: [id number] Patient doing very well continue to mobilize  Neurologically nonfocal incisions clean dry and intact  Transfer to floor.

## 2018-01-28 LAB — GLUCOSE, CAPILLARY
Glucose-Capillary: 150 mg/dL — ABNORMAL HIGH (ref 65–99)
Glucose-Capillary: 174 mg/dL — ABNORMAL HIGH (ref 65–99)

## 2018-01-28 MED ORDER — DEXAMETHASONE SODIUM PHOSPHATE 10 MG/ML IJ SOLN
2.0000 mg | Freq: Three times a day (TID) | INTRAMUSCULAR | Status: DC
  Administered 2018-01-28 – 2018-01-29 (×4): 2 mg via INTRAVENOUS
  Filled 2018-01-28 (×4): qty 1

## 2018-01-28 MED FILL — Thrombin For Soln 20000 Unit: CUTANEOUS | Qty: 1 | Status: AC

## 2018-01-28 NOTE — Progress Notes (Signed)
Physical Therapy Treatment Patient Details Name: Ryan Savage MRN: [id number] DOB: 30-May-1929 Today's Date: 01/28/2018    History of Present Illness Ryan Savage is a 84 y.o. male with past medical history significant for chronic atrial fib, on coumadin, CHF, recent hx CVA, SIADH, OSA, HTN, HLD who presents with left leg weakness. He states that he is able to walk, but feeling like his left leg is significantly weak and off balance. In the ED, CT head revealed bilateral subdual hematomas with 70mm right to left shift. Underwent burr holes for evacuation on 01/25/18.    PT Comments    Pt progressing well towards functional mobility goals. Pt ambulating min assist with RW. Noted decrease in stability and speed with multi-tasking/answering questions during gait. Pt declined attempt to walk without RW as he is still very concerned about LLE and is afraid it will give out and cause him to fall, though no signs of buckling during functional mobility. Able to perform stair negotiation this session with rail min assist for steadying. Current plan remains appropriate. Will continue to follow acutely and progress as tolerated.    Follow Up Recommendations  Outpatient PT;Supervision for mobility/OOB     Equipment Recommendations  None recommended by PT    Recommendations for Other Services       Precautions / Restrictions Precautions Precautions: Fall Restrictions Weight Bearing Restrictions: No    Mobility  Bed Mobility Overal bed mobility: Needs Assistance Bed Mobility: Sit to Supine     Supine to sit: Supervision     General bed mobility comments: supervision for safety, no physical assist required, able to roll and bridge to reposition  Transfers Overall transfer level: Needs assistance Equipment used: Rolling walker (2 wheeled) Transfers: Sit to/from Stand Sit to Stand: Min guard         General transfer comment: min guard for safety and stability, pt nervous about  putting weight through LLE but able to do so without any issues. mild dizziness noted immediately after standing but quickly subsides  Ambulation/Gait Ambulation/Gait assistance: Min guard Ambulation Distance (Feet): 400 Feet Assistive device: Rolling walker (2 wheeled) Gait Pattern/deviations: Step-through pattern;Trunk flexed Gait velocity: decreased   General Gait Details: pt refusing to try to walk without RW at this time. very nervous about LLE "giving him problems", no signs of LLE weakness or buckling during functional activity, VCs for upright posture and increased cadence (pt says he is nervous to "walk too fast and over do it"), demonstrates decreased stability and speed when multitasking and often stops walking to repsond to questions asked   Stairs Stairs: Yes   Stair Management: One rail Left;Alternating pattern;Step to pattern Number of Stairs: 4 General stair comments: alternating to ascend, step to to descend. min assist for steadying with heavy reliance on rail.  Wheelchair Mobility    Modified Rankin (Stroke Patients Only) Modified Rankin (Stroke Patients Only) Pre-Morbid Rankin Score: Moderate disability Modified Rankin: Moderately severe disability     Balance Overall balance assessment: Needs assistance Sitting-balance support: Feet supported;No upper extremity supported Sitting balance-Leahy Scale: Good     Standing balance support: During functional activity Standing balance-Leahy Scale: Fair Standing balance comment: able to maintain balance min guard static standing without UE support to use urinal, uses bil UE on walker for ambulation                            Cognition Arousal/Alertness: Awake/alert Behavior During Therapy: [facility] for tasks assessed/performed  Overall Cognitive Status: Impaired/Different from baseline Area of Impairment: Problem solving;Following commands                       Following Commands: Follows  multi-step commands with increased time     Problem Solving: Slow processing General Comments: pt with increased time to follow commands      Exercises      General Comments General comments (skin integrity, edema, etc.): Pt's wife present throughout session. BP 86/62 in sitting, 142/91 immediately after walking.      Pertinent Vitals/Pain Pain Assessment: No/denies pain    Home Living                      Prior Function            PT Goals (current goals can now be found in the care plan section) Acute Rehab PT Goals Patient Stated Goal: to go home PT Goal Formulation: With patient Time For Goal Achievement: 02/10/18 Potential to Achieve Goals: Good Progress towards PT goals: Progressing toward goals    Frequency    Min 3X/week      PT Plan Current plan remains appropriate    Co-evaluation              AM-PAC PT "6 Clicks" Daily Activity  Outcome Measure  Difficulty turning over in bed (including adjusting bedclothes, sheets and blankets)?: None Difficulty moving from lying on back to sitting on the side of the bed? : A Little Difficulty sitting down on and standing up from a chair with arms (e.g., wheelchair, bedside commode, etc,.)?: A Little Help needed moving to and from a bed to chair (including a wheelchair)?: A Little Help needed walking in hospital room?: A Little Help needed climbing 3-5 steps with a railing? : A Little 6 Click Score: 19    End of Session Equipment Utilized During Treatment: Gait belt Activity Tolerance: Patient tolerated treatment well Patient left: in bed;with call bell/phone within reach;with bed alarm set;with family/visitor present Nurse Communication: Mobility status PT Visit Diagnosis: Unsteadiness on feet (R26.81);Repeated falls (R29.6)     Time: 7654-6503 PT Time Calculation (min) (ACUTE ONLY): 29 min  Charges:  $Gait Training: 8-22 mins $Therapeutic Activity: 8-22 mins                    G Codes:        Vic Ripper, SPT  Vic Ripper 01/28/2018, 5:39 PM

## 2018-01-28 NOTE — Progress Notes (Signed)
Subjective: Patient reports patient improving no headache  Objective: Vital signs in last 24 hours: Temp:  [97.4 F (36.3 C)-97.9 F (36.6 C)] 97.4 F (36.3 C) (03/20 0400) Pulse Rate:  [59-110] 59 (03/20 0400) Resp:  [12-25] 20 (03/20 0400) BP: (89-138)/(64-126) 127/88 (03/20 0400) SpO2:  [91 %-100 %] 96 % (03/20 0400) Weight:  [79.9 kg (176 lb 2.4 oz)] 79.9 kg (176 lb 2.4 oz) (03/20 0500)  Intake/Output from previous day: 03/19 0701 - 03/20 0700 In: 1280 [P.O.:1080; IV Piggyback:200] Out: 2750 [Urine:2750] Intake/Output this shift: No intake/output data recorded.  strength improved right upper cavity no pronator drift this morning.  Lab Results: Recent Labs    01/26/18 0319  WBC 12.4*  HGB 11.3*  HCT 33.8*  PLT 159   BMET No results for input(s): NA, K, CL, CO2, GLUCOSE, BUN, CREATININE, CALCIUM in the last 72 hours.  Studies/Results: Ct Head Wo Contrast  Result Date: 01/26/2018 CLINICAL DATA:  Followup placement of subdural drain. Acute headache. EXAM: CT HEAD WITHOUT CONTRAST TECHNIQUE: Contiguous axial images were obtained from the base of the skull through the vertex without intravenous contrast. COMPARISON:  Earlier today and multiple previous. FINDINGS: Brain: The patient has developed diffuse subarachnoid hemorrhage in the posterior fossa. I do not think we are dealing with posterior fossa subdural hematomas. The etiology of this is unclear. Bilateral subdural fluid/blood/air collections appear very similar to the earlier study. I think the subdural fluid on the left is slightly more dense but the amount of subdural fluid and blood does not appear increased. Less subdural air on the right. Mass-effect upon the brain appears the same. Ventricular size is stable. Vascular: There is atherosclerotic calcification of the major vessels at the base of the brain. Skull: Bilateral bur holes appear the same. Sinuses/Orbits: Clear/normal Other: None IMPRESSION: New demonstration  subarachnoid blood prominently throughout the posterior fossa, etiology uncertain. No increase in bilateral subdural fluid/blood/air. The subdural collection on the left may be slightly more dense. Call report in progress. Electronically Signed   By: Nelson Chimes M.D.   On: 01/26/2018 16:36    Assessment/Plan: Continue to mobilize okay to transfer when bed becomes available CT head in morning.  LOS: 5 days     Shannan Slinker P 01/28/2018, 8:25 AM

## 2018-01-29 ENCOUNTER — Inpatient Hospital Stay (HOSPITAL_COMMUNITY): Payer: Medicare Other

## 2018-01-29 LAB — GLUCOSE, CAPILLARY: Glucose-Capillary: 130 mg/dL — ABNORMAL HIGH (ref 65–99)

## 2018-01-29 MED ORDER — DOCUSATE SODIUM 100 MG PO CAPS: 100.0000 mg | ORAL_CAPSULE | Freq: Two times a day (BID) | ORAL | 0 refills | Status: DC

## 2018-01-29 MED ORDER — MAGNESIUM CITRATE PO SOLN
1.0000 | Freq: Once | ORAL | Status: AC
Start: 2018-01-29 — End: 2018-01-29
  Administered 2018-01-29: 1 via ORAL
  Filled 2018-01-29: qty 296

## 2018-01-29 NOTE — Progress Notes (Signed)
Patient ambulated approximately 500 ft using a four wheel walker with stand by assist. Patient tolerated well.

## 2018-01-29 NOTE — Progress Notes (Signed)
Patient ID: Ryan Savage, male   DOB: [birth year], 84 y.o.   MRN: [id number] Patient overall doing well significant improvement preoperative headaches and no evidence of weakness.  Awake alert incision is clean dry and intact no pronator drift  CT scan does show improvementin the subdural space with minimal to no mass effect however there is a evolving hemorrhagic ight cerebellar hemisphere small with minimal mass effect was apparent on theearlier CT scan but has evolved somewhat. I think it's okay for the patient be discharged if he is cleared by physical therapy.

## 2018-01-29 NOTE — Progress Notes (Signed)
Occupational Therapy Treatment Patient Details Name: Ryan Savage MRN: [id number] DOB: 10-Feb-1929 Today's Date: 01/29/2018    History of present illness Ryan Savage is a 84 y.o. male with past medical history significant for chronic atrial fib, on coumadin, CHF, recent hx CVA, SIADH, OSA, HTN, HLD who presents with left leg weakness. He states that he is able to walk, but feeling like his left leg is significantly weak and off balance. In the ED, CT head revealed bilateral subdual hematomas with 33m right to left shift. Underwent burr holes for evacuation on 01/25/18.   OT comments  This 84yo male admitted and underwent above presents to acute OT at a S level for basic ADLs from RW level and has S from his wife at home. Pt is very aware of safety with transitions and verbalizes understanding of safety in shower and with LBADLs. He ambulated 200 feet with RW with S and was able to find his way back to his room. Recommend HFincastlewith S from wife until she feels he can be at an intermittent S to Mod I level. Feel pt is at a safe level to D/C home with wife and so we will D/C from acute OT.   Follow Up Recommendations  Home health OT    Equipment Recommendations  None recommended by OT       Precautions / Restrictions Precautions Precautions: Fall Restrictions Weight Bearing Restrictions: No       Mobility Bed Mobility               General bed mobility comments: Pt up in recliner upon my arrival  Transfers Overall transfer level: Needs assistance Equipment used: Rolling walker (2 wheeled) Transfers: Sit to/from Stand Sit to Stand: Supervision         General transfer comment: S ambulating with RW in room and on unit, min guard A without AD    Balance Overall balance assessment: Needs assistance Sitting-balance support: Feet supported;No upper extremity supported Sitting balance-Leahy Scale: Good     Standing balance support: During functional activity;Single  extremity supported Standing balance-Leahy Scale: Poor Standing balance comment: able to maintain balance min guard static standing without UE support, uses bil UE on walker for ambulation                           ADL either performed or assessed with clinical judgement   ADL Overall ADL's : Needs assistance/impaired                                       General ADL Comments: Pt currently S for all BADLs at RW level. Pt very aware that he needs to take his time with transition movements and displayed this throughout session. We did dicuss sitting for LBD not standing and to be extra careful in shower where there is an increased fall risk--he verbalized understanding     Vision Baseline Vision/History: Wears glasses Wears Glasses: At all times            Cognition Arousal/Alertness: Awake/alert Behavior During Therapy: WFL for tasks assessed/performed Overall Cognitive Status: Within Functional Limits for tasks assessed                                 General Comments: Pt followed all commands in a  normal amount of time, he was able to leave his room and find his way back, he was oriented x4                   Pertinent Vitals/ Pain       Pain Assessment: No/denies pain         Frequency  Min 2X/week        Progress Toward Goals  OT Goals(current goals can now be found in the care plan section)  Progress towards OT goals: Goals met/education completed, patient discharged from [place] Discharge plan needs to be updated       AM-PAC PT "6 Clicks" Daily Activity     Outcome Measure   Help from another person eating meals?: None Help from another person taking care of personal grooming?: A Little(pt needs S for all basic ADLs except eating) Help from another person toileting, which includes using toliet, bedpan, or urinal?: A Little Help from another person bathing (including washing, rinsing, drying)?: A  Little Help from another person to put on and taking off regular upper body clothing?: A Little Help from another person to put on and taking off regular lower body clothing?: A Little 6 Click Score: 19    End of Session Equipment Utilized During Treatment: Rolling walker  OT Visit Diagnosis: Unsteadiness on feet (R26.81);Other abnormalities of gait and mobility (R26.89);Muscle weakness (generalized) (M62.81)   Activity Tolerance Patient tolerated treatment well   Patient Left in chair;with call bell/phone within reach;with chair alarm set;with family/visitor present   Nurse Communication (pt is ready from an OT standpoint to be D/C but needs HHOT not OPOT)        Time: 3832-9191 OT Time Calculation (min): 22 min  Charges: OT General Charges $OT Visit: 1 Visit OT Treatments $Self Care/Home Management : 8-22 mins  [name], OTR/L [phone] 01/29/2018

## 2018-01-29 NOTE — Care Management Note (Signed)
Case Management Note  Patient Details  Name: Jailin Moomaw MRN: [id number] Date of Birth: 05-31-1929  Subjective/Objective:  Pt admitted on 01/23/18 with bilateral subdual hematomas with 68mm right to left shift. Underwent burr holes for evacuation on 01/25/18.  PTA, pt independent, lives at home with spouse.                  Action/Plan: PT/OT recommending OP follow up.  Will make referrals to [facility] prior to dc.     Expected Discharge Date:  01/29/18               Expected Discharge Plan:  [place]  In-House Referral:     Discharge planning Services  CM Consult  Post Acute Care Choice:  Resumption of Svcs/PTA Provider Choice offered to:  Patient  DME Arranged:    DME Agency:     HH Arranged:  RN, PT, OT HH Agency:  [place]  Status of Service:  Completed, signed off  If discussed at [place] of Stay Meetings, dates discussed:    Additional Comments:  01/29/18 J. Magan Winnett, RN, BSN Pt medically stable for discharge later today.  Pt active with [facility] for HHRN and HHPT, and would like to continue home therapies, rather than OP therapies.  Notified [facility] rep Dan of resumption orders for [facility] and addition of [facility].  [facility] to follow up with pt at home.  No DME needed at home; wife able to provide assistance at home.    Reinaldo Raddle, RN, BSN  Trauma/Neuro ICU Case Manager [phone]

## 2018-01-29 NOTE — Progress Notes (Signed)
Patient is discharged from room 4NP04 at this time. Alert and in stable condition. IV site d/c'd and instructions read to patient and family with understanding verbalized. Left unit via wheelchair with all belongings at side.

## 2018-02-05 ENCOUNTER — Encounter: Payer: Self-pay | Admitting: Cardiology

## 2018-02-05 ENCOUNTER — Ambulatory Visit: Payer: Medicare Other | Admitting: Cardiology

## 2018-02-05 VITALS — BP 116/68 | HR 78 | Ht 70.0 in | Wt 167.6 lb

## 2018-02-05 DIAGNOSIS — I4821 Permanent atrial fibrillation: Secondary | ICD-10-CM

## 2018-02-05 DIAGNOSIS — I10 Essential (primary) hypertension: Secondary | ICD-10-CM | POA: Diagnosis not present

## 2018-02-05 DIAGNOSIS — I25110 Atherosclerotic heart disease of native coronary artery with unstable angina pectoris: Secondary | ICD-10-CM | POA: Diagnosis not present

## 2018-02-05 DIAGNOSIS — G4733 Obstructive sleep apnea (adult) (pediatric): Secondary | ICD-10-CM

## 2018-02-05 DIAGNOSIS — E7800 Pure hypercholesterolemia, unspecified: Secondary | ICD-10-CM

## 2018-02-05 DIAGNOSIS — I482 Chronic atrial fibrillation: Secondary | ICD-10-CM

## 2018-02-05 DIAGNOSIS — I5022 Chronic systolic (congestive) heart failure: Secondary | ICD-10-CM | POA: Diagnosis not present

## 2018-02-05 DIAGNOSIS — I2510 Atherosclerotic heart disease of native coronary artery without angina pectoris: Secondary | ICD-10-CM

## 2018-02-05 DIAGNOSIS — I341 Nonrheumatic mitral (valve) prolapse: Secondary | ICD-10-CM | POA: Diagnosis not present

## 2018-02-05 MED ORDER — SACUBITRIL-VALSARTAN 24-26 MG PO TABS: 1.0000 | ORAL_TABLET | Freq: Two times a day (BID) | ORAL | 1 refills | Status: DC

## 2018-02-05 NOTE — Progress Notes (Signed)
Cardiology Office Note:    Date:  02/05/2018   ID:  Ryan Savage, DOB 1929/01/06, MRN [id number]  PCP:  Lajean Manes, MD  Cardiologist:  Fransico Him, MD    Referring MD: Lajean Manes, MD   Chief Complaint  Patient presents with  . Coronary Artery Disease  . Hypertension  . Mitral Regurgitation  . Atrial Fibrillation  . Hyperlipidemia    History of Present Illness:    Ryan Savage is a 84 y.o. male with a hx of posterior MV leaflet prolapse with with moderate MR, tricuspid valve prolapse with mild to moderate TR, moderate pulmonary hypertension, HTN and chronic atrial fibrillation on chronic anticoagulation.  He was recently hospitalized in January with decreased appetite, abdominal fullness and nonproductive cough as well as shortness of breath.  BNP was elevated at 1421 and troponin was less than 0.08 and 0.09.  TSH was normal at 0.574.  Chest x-ray showed cardiomegaly with bilateral pulmonary edema.  He was diuresed with IV Lasix.  He also had poorly controlled A. fib and rate was managed with IV Cardizem.  Repeat 2D echocardiogram showed a decline in LV function with EF 30-35% with hypokinesis of the inferior, mid to apical anterior septal, inferior septal and apical anterior as well as apical myocardium.  2D echocardiogram also showed mitral valve prolapse with mild MR and moderate TR.  He underwent right and left heart catheterization showing severe two-vessel coronary disease with chronic total occlusions of the LAD and RCA with extensive collaterals.  His left main, circumflex and intermediate branches were all normal.  He had low right and left-sided intracardiac filling pressures.  Mitral regurgitation only appeared mild to moderate and there were no significant V waves on a pulmonary capillary wedge tracings.  It was felt his mitral regurgitation was not playing a role in his acute exacerbation of CHF.  Unfortunately he presented back to the emergency room on 01/23/2018 with  complaints of weakness in his left leg and being off balance.  CT of the head showed revealed bilateral subdural hematomas with 9 mm right to left shift and his INR was reversed with vitamin K and K Centra.  He underwent bilateral bur holes for evacuation of subacute to chronic subdural hematomas.  He was discharged home on 01/29/2018 in stable condition.  He is here today for followup and is doing well.  He denies any chest pain or pressure, SOB, DOE, PND, orthopnea, LE edema, dizziness, palpitations or syncope. He is compliant with his meds and is tolerating meds with no SE.      Past Medical History:  Diagnosis Date  . Atrial fibrillation, chronic (HCC)    Not on anticoagulation due to history of bilateral subdural bleeds due to recurrent falls  . BPH (benign prostatic hypertrophy)   . [name] (coronary artery disease), native coronary artery    Severe two-vessel [name] with chronically occluded LAD and RCA with widely patent left main, intermediate, and left circumflex branches.  On medical management.  He has extensive collaterals.  . 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 fib with RVR  . [name] (degenerative joint disease)   . Hyperlipidemia    LDL goal less then 100  . Hypertension   . Microscopic hematuria    [name]  . Mitral valve prolapse    with moderate MR  . OSA (  obstructive sleep apnea)    intolerant with CPAP (11/19/2017)  . SIADH (syndrome of inappropriate ADH production) ([place])   . Skin cancer    "side of my nose" (11/19/2017)  . Thrombocytopenia (HCC)    Mild- platelet count 143,000 on 02/2011, stable 08/2011    Past Surgical History:  Procedure Laterality Date  . APPENDECTOMY  1950  . BURR HOLE Bilateral 01/25/2018   Procedure: Haskell Flirt;  Surgeon: Kary Kos, MD;  Location: [facility];  Service: Neurosurgery;  Laterality:  Bilateral;  . LUMBAR DISC SURGERY  1959   ruptured disc repair  . RIGHT/LEFT HEART CATH AND CORONARY ANGIOGRAPHY N/A 11/21/2017   Procedure: RIGHT/LEFT HEART CATH AND CORONARY ANGIOGRAPHY;  Surgeon: Sherren Mocha, MD;  Location: [facility];  Service: Cardiovascular;  Laterality: N/A;  . SKIN CANCER EXCISION Left    "side of my nose"    Current Medications: Current Meds  Medication Sig  . atorvastatin (LIPITOR) 20 MG tablet Take 20 mg by mouth every evening.  . digoxin (LANOXIN) 0.125 MG tablet Take 1 tablet (0.125 mg total) by mouth daily.  Marland Kitchen docusate sodium (COLACE) 100 MG capsule Take 1 capsule (100 mg total) by mouth 2 (two) times daily.  . finasteride (PROSCAR) 5 MG tablet Take 5 mg by mouth daily.   . fluticasone (FLONASE) 50 MCG/ACT nasal spray Place 1 spray into both nostrils daily.  . furosemide (LASIX) 20 MG tablet Take 1 tablet (20 mg total) by mouth daily.  Marland Kitchen lactulose (CHRONULAC) 10 GM/15ML solution Take 15 mLs (10 g total) by mouth 2 (two) times daily as needed for mild constipation.  . metoprolol succinate (TOPROL-XL) 25 MG 24 hr tablet Take 25 mg by mouth 2 (two) times daily.  . pantoprazole (PROTONIX) 40 MG tablet Take 40 mg by mouth every evening.     Allergies:   Novocain [procaine]   Social History   Socioeconomic History  . Marital status: Married    Spouse name: Not on file  . Number of children: Not on file  . Years of education: Not on file  . Highest education level: Not on file  Occupational History  . Occupation: retired  Scientific laboratory technician  . Financial resource strain: Not on file  . Food insecurity:    Worry: Not on file    Inability: Not on file  . Transportation needs:    Medical: Not on file    Non-medical: Not on file  Tobacco Use  . Smoking status: Former Smoker    Years: 3.00    Types: Cigarettes    Last attempt to quit: 1958    Years since quitting: 61.2  . Smokeless tobacco: Never Used  Substance and Sexual Activity  . Alcohol  use: No  . Drug use: No  . Sexual activity: Not on file  Lifestyle  . Physical activity:    Days per week: Not on file    Minutes per session: Not on file  . Stress: Not on file  Relationships  . Social connections:    Talks on phone: Not on file    Gets together: Not on file    Attends religious service: Not on file    Active member of club or organization: Not on file    Attends meetings of clubs or organizations: Not on file    Relationship status: Not on file  Other Topics Concern  . Not on file  Social History Narrative  . Not on file     Family  History: The patient's family history includes [name] in his brother and father; Colon cancer in his brother and sister; Heart attack in his brother and father; Heart failure in his mother.  ROS:   Please see the history of present illness.    Review of Systems  Musculoskeletal: Positive for back pain.  Neurological: Positive for dizziness, headaches and loss of balance.    All other systems reviewed and negative.   EKGs/Labs/Other Studies Reviewed:    The following studies were reviewed today: 2D echo and heart cath from 11/2017  EKG:  EKG is not ordered today.   Recent Labs: 11/18/2017: B Natriuretic Peptide 1,421.3 11/27/2017: Magnesium 2.0 12/08/2017: TSH 0.773 01/23/2018: ALT 13 01/25/2018: BUN 9; Creatinine, Ser 0.68; Potassium 3.8; Sodium 128 01/26/2018: Hemoglobin 11.3; Platelets 159   Recent Lipid Panel    Component Value Date/Time   CHOL 76 12/09/2017 0513   TRIG 73 12/09/2017 0513   HDL 25 (L) 12/09/2017 0513   CHOLHDL 3.0 12/09/2017 0513   VLDL 15 12/09/2017 0513   LDLCALC 36 12/09/2017 0513    Physical Exam:    VS:  BP 116/68   Pulse 78   Ht 5\' 10"  (1.778 m)   Wt 167 lb 9.6 oz (76 kg)   BMI 24.05 kg/m     Wt Readings from Last 3 Encounters:  02/05/18 167 lb 9.6 oz (76 kg)  01/29/18 193 lb 9 oz (87.8 kg)  12/08/17 175 lb (79.4 kg)     GEN:  Well nourished, well developed in no acute  distress HEENT: Normal NECK: No JVD; No carotid bruits LYMPHATICS: No lymphadenopathy CARDIAC: RRR, no murmurs, rubs, gallops RESPIRATORY:  Clear to auscultation without rales, wheezing or rhonchi  ABDOMEN: Soft, non-tender, non-distended MUSCULOSKELETAL:  No edema; No deformity  SKIN: Warm and dry NEUROLOGIC:  Alert and oriented x 3 PSYCHIATRIC:  Normal affect   ASSESSMENT:    1. Permanent atrial fibrillation ([place])   2. Benign essential HTN   3. Mitral valve prolapse   4. Coronary artery disease involving native coronary artery of native heart without angina pectoris   5. Chronic systolic heart failure ([place])   6. OSA (obstructive sleep apnea)   7. Pure hypercholesterolemia   8. Coronary artery disease involving native coronary artery of native heart with unstable angina pectoris ([place])    PLAN:    In order of problems listed above:  1.  Permanent atrial fibrillation -he is well rate controlled on exam today.  He is now off anticoagulation which is now contraindicated due to recent subdural hematomas secondfary to recurrent falls.  He will continue on digoxin 0.125 mg daily and Toprol XL 25 mg twice daily for rate control.  2.  HTN -blood pressure is well controlled on exam today.  He will continue on Toprol-XL 25 mg twice daily.  3.  Mitral valve prolapse with mild mitral regurgitation by recent right and left heart catheterization.  There were not no evidence of V waves on pulmonary capillary wedge tracing and 2D echocardiogram confirmed only mild mitral regurgitation.  4.  ASCAD -left heart cath in January 2019 showed severe two-vessel coronary disease with chronic total occlusions of the LAD and RCA with extensive collateral flow.  The left main, circumflex and intermediate branches were all normal.  He has not had any anginal symptoms on medical therapy.  He will continue on Toprol XL 25 mg twice daily.  He is not on aspirin at this time given recent subdural bleed.  We  will  check with neurosurgery as to when he would be okay to be back on aspirin.  5.  Chronic systolic heart failure secondary to ischemic cardiomyopathy as well as possibly a component of tachycardia induced cardiomyopathy from his atrial fibrillation.  Recent 2D echocardiogram showed EF 30-35%.  He will continue on digoxin 0.125 mg daily, Toprol-XL 25 mg twice daily and Lasix 20 mg daily.  He appears euvolemic on exam and his weight is stable.  I am going to add on Entresto 24-25 mg twice daily.  I will have him follow-up in hypertension clinic in 3 weeks for up titration of his Entresto and also consideration of adding low-dose Aldactone at that time.  I will see him back in 2 months and repeat 2D echocardiogram at that time.  6.  OSA -intolerant to CPAP  7.  Hyperlipidemia with LDL goal less than 70.  His LDL was at goal at 36 on 12/09/2017.  He will continue on atorvastatin 20 mg daily.  Medication Adjustments/Labs and Tests Ordered: Current medicines are reviewed at length with the patient today.  Concerns regarding medicines are outlined above.  No orders of the defined types were placed in this encounter.  No orders of the defined types were placed in this encounter.   Signed, Fransico Him, MD  02/05/2018 12:20 PM    [place]

## 2018-02-05 NOTE — Patient Instructions (Signed)
Medication Instructions:  Your physician has recommended you make the following change in your medication:  START: Entresto 24-26 mg (1 tablet) two times a day   If you need a refill on your cardiac medications, please contact your pharmacy first.  Labwork: None ordered   Testing/Procedures: Your physician has requested that you have an echocardiogram. Echocardiography is a painless test that uses sound waves to create images of your heart. It provides your doctor with information about the size and shape of your heart and how well your heart's chambers and valves are working. This procedure takes approximately one hour. There are no restrictions for this procedure.  Follow-Up: Your physician recommends that you schedule a follow-up appointment in 3 weeks with the hypertension clinic   Your physician wants you to follow-up in 3 months with [name]   Any Other Special Instructions Will Be Listed Below (If Applicable).   Thank you for choosing [facility], RN  [phone]  If you need a refill on your cardiac medications before your next appointment, please call your pharmacy.

## 2018-02-11 ENCOUNTER — Other Ambulatory Visit: Payer: Self-pay | Admitting: Neurosurgery

## 2018-02-11 DIAGNOSIS — S065XAA Traumatic subdural hemorrhage with loss of consciousness status unknown, initial encounter: Secondary | ICD-10-CM

## 2018-02-11 DIAGNOSIS — S065X9A Traumatic subdural hemorrhage with loss of consciousness of unspecified duration, initial encounter: Secondary | ICD-10-CM

## 2018-02-16 ENCOUNTER — Ambulatory Visit
Admission: RE | Admit: 2018-02-16 | Discharge: 2018-02-16 | Disposition: A | Discharge status: Home / Self Care | Payer: Medicare Other | Source: Ambulatory Visit | Attending: Neurosurgery | Admitting: Neurosurgery

## 2018-02-16 DIAGNOSIS — S065X9A Traumatic subdural hemorrhage with loss of consciousness of unspecified duration, initial encounter: Secondary | ICD-10-CM

## 2018-02-16 DIAGNOSIS — S065XAA Traumatic subdural hemorrhage with loss of consciousness status unknown, initial encounter: Secondary | ICD-10-CM

## 2018-02-19 ENCOUNTER — Other Ambulatory Visit (HOSPITAL_COMMUNITY): Payer: Self-pay | Admitting: Neurosurgery

## 2018-02-19 ENCOUNTER — Observation Stay (HOSPITAL_COMMUNITY): Payer: Medicare Other

## 2018-02-19 ENCOUNTER — Other Ambulatory Visit: Payer: Self-pay

## 2018-02-19 ENCOUNTER — Inpatient Hospital Stay (HOSPITAL_COMMUNITY)
Admission: AD | Admit: 2018-02-19 | Discharge: 2018-02-24 | DRG: 025 | Disposition: A | Discharge status: Rehabilitation Facility | Payer: Medicare Other | Source: Ambulatory Visit | Attending: Neurosurgery | Admitting: Neurosurgery

## 2018-02-19 ENCOUNTER — Observation Stay (HOSPITAL_COMMUNITY)
Admission: RE | Admit: 2018-02-19 | Discharge: 2018-02-19 | Disposition: A | Discharge status: Home / Self Care | Payer: Medicare Other | Source: Ambulatory Visit | Attending: Neurosurgery | Admitting: Neurosurgery

## 2018-02-19 ENCOUNTER — Encounter (HOSPITAL_COMMUNITY): Payer: Self-pay

## 2018-02-19 ENCOUNTER — Telehealth: Payer: Self-pay | Admitting: Cardiology

## 2018-02-19 ENCOUNTER — Encounter (HOSPITAL_COMMUNITY): Payer: Self-pay | Admitting: General Practice

## 2018-02-19 DIAGNOSIS — R402252 Coma scale, best verbal response, oriented, at arrival to emergency department: Secondary | ICD-10-CM | POA: Diagnosis present

## 2018-02-19 DIAGNOSIS — Z85828 Personal history of other malignant neoplasm of skin: Secondary | ICD-10-CM

## 2018-02-19 DIAGNOSIS — R471 Dysarthria and anarthria: Secondary | ICD-10-CM | POA: Diagnosis present

## 2018-02-19 DIAGNOSIS — I420 Dilated cardiomyopathy: Secondary | ICD-10-CM | POA: Diagnosis present

## 2018-02-19 DIAGNOSIS — K5900 Constipation, unspecified: Secondary | ICD-10-CM | POA: Diagnosis present

## 2018-02-19 DIAGNOSIS — I6203 Nontraumatic chronic subdural hemorrhage: Principal | ICD-10-CM | POA: Diagnosis present

## 2018-02-19 DIAGNOSIS — Z79899 Other long term (current) drug therapy: Secondary | ICD-10-CM

## 2018-02-19 DIAGNOSIS — D62 Acute posthemorrhagic anemia: Secondary | ICD-10-CM | POA: Diagnosis present

## 2018-02-19 DIAGNOSIS — I482 Chronic atrial fibrillation: Secondary | ICD-10-CM | POA: Diagnosis present

## 2018-02-19 DIAGNOSIS — T380X5A Adverse effect of glucocorticoids and synthetic analogues, initial encounter: Secondary | ICD-10-CM | POA: Diagnosis present

## 2018-02-19 DIAGNOSIS — E785 Hyperlipidemia, unspecified: Secondary | ICD-10-CM | POA: Diagnosis present

## 2018-02-19 DIAGNOSIS — I110 Hypertensive heart disease with heart failure: Secondary | ICD-10-CM | POA: Diagnosis present

## 2018-02-19 DIAGNOSIS — N400 Enlarged prostate without lower urinary tract symptoms: Secondary | ICD-10-CM | POA: Diagnosis present

## 2018-02-19 DIAGNOSIS — G935 Compression of brain: Secondary | ICD-10-CM | POA: Diagnosis present

## 2018-02-19 DIAGNOSIS — S065XAA Traumatic subdural hemorrhage with loss of consciousness status unknown, initial encounter: Secondary | ICD-10-CM

## 2018-02-19 DIAGNOSIS — I959 Hypotension, unspecified: Secondary | ICD-10-CM | POA: Diagnosis present

## 2018-02-19 DIAGNOSIS — S065X9A Traumatic subdural hemorrhage with loss of consciousness of unspecified duration, initial encounter: Secondary | ICD-10-CM

## 2018-02-19 DIAGNOSIS — G4733 Obstructive sleep apnea (adult) (pediatric): Secondary | ICD-10-CM | POA: Diagnosis present

## 2018-02-19 DIAGNOSIS — Z884 Allergy status to anesthetic agent status: Secondary | ICD-10-CM

## 2018-02-19 DIAGNOSIS — I2510 Atherosclerotic heart disease of native coronary artery without angina pectoris: Secondary | ICD-10-CM | POA: Diagnosis present

## 2018-02-19 DIAGNOSIS — E222 Syndrome of inappropriate secretion of antidiuretic hormone: Secondary | ICD-10-CM | POA: Diagnosis present

## 2018-02-19 DIAGNOSIS — G936 Cerebral edema: Secondary | ICD-10-CM | POA: Diagnosis present

## 2018-02-19 DIAGNOSIS — I5022 Chronic systolic (congestive) heart failure: Secondary | ICD-10-CM | POA: Diagnosis present

## 2018-02-19 DIAGNOSIS — Z8249 Family history of ischemic heart disease and other diseases of the circulatory system: Secondary | ICD-10-CM

## 2018-02-19 DIAGNOSIS — R402362 Coma scale, best motor response, obeys commands, at arrival to emergency department: Secondary | ICD-10-CM | POA: Diagnosis present

## 2018-02-19 DIAGNOSIS — E871 Hypo-osmolality and hyponatremia: Secondary | ICD-10-CM | POA: Diagnosis present

## 2018-02-19 DIAGNOSIS — R739 Hyperglycemia, unspecified: Secondary | ICD-10-CM | POA: Diagnosis present

## 2018-02-19 DIAGNOSIS — Z800 Family history of malignant neoplasm of digestive organs: Secondary | ICD-10-CM

## 2018-02-19 DIAGNOSIS — M1990 Unspecified osteoarthritis, unspecified site: Secondary | ICD-10-CM | POA: Diagnosis present

## 2018-02-19 DIAGNOSIS — I341 Nonrheumatic mitral (valve) prolapse: Secondary | ICD-10-CM | POA: Diagnosis present

## 2018-02-19 DIAGNOSIS — I255 Ischemic cardiomyopathy: Secondary | ICD-10-CM | POA: Diagnosis present

## 2018-02-19 DIAGNOSIS — R531 Weakness: Secondary | ICD-10-CM | POA: Diagnosis present

## 2018-02-19 DIAGNOSIS — Z87891 Personal history of nicotine dependence: Secondary | ICD-10-CM

## 2018-02-19 DIAGNOSIS — R402142 Coma scale, eyes open, spontaneous, at arrival to emergency department: Secondary | ICD-10-CM | POA: Diagnosis present

## 2018-02-19 DIAGNOSIS — K219 Gastro-esophageal reflux disease without esophagitis: Secondary | ICD-10-CM | POA: Diagnosis present

## 2018-02-19 DIAGNOSIS — Z7951 Long term (current) use of inhaled steroids: Secondary | ICD-10-CM

## 2018-02-19 DIAGNOSIS — I340 Nonrheumatic mitral (valve) insufficiency: Secondary | ICD-10-CM | POA: Diagnosis present

## 2018-02-19 HISTORY — DX: Traumatic subdural hemorrhage with loss of consciousness of unspecified duration, initial encounter: S06.5X9A

## 2018-02-19 HISTORY — DX: Gastro-esophageal reflux disease without esophagitis: K21.9

## 2018-02-19 HISTORY — DX: Cerebral infarction, unspecified: I63.9

## 2018-02-19 LAB — BASIC METABOLIC PANEL
Anion gap: 9 (ref 5–15)
BUN: 13 mg/dL (ref 6–20)
CO2: 27 mmol/L (ref 22–32)
Calcium: 8.6 mg/dL — ABNORMAL LOW (ref 8.9–10.3)
Chloride: 86 mmol/L — ABNORMAL LOW (ref 101–111)
Creatinine, Ser: 0.84 mg/dL (ref 0.61–1.24)
GFR calc Af Amer: >60 mL/min (ref >60)
GFR calc non Af Amer: >60 mL/min (ref >60)
Glucose, Bld: 104 mg/dL — ABNORMAL HIGH (ref 65–99)
Potassium: 4.3 mmol/L (ref 3.5–5.1)
Sodium: 122 mmol/L — ABNORMAL LOW (ref 135–145)

## 2018-02-19 LAB — CBC
HCT: 36.5 % — ABNORMAL LOW (ref 39.0–52.0)
Hemoglobin: 12.6 g/dL — ABNORMAL LOW (ref 13.0–17.0)
MCH: 31.5 pg (ref 26.0–34.0)
MCHC: 34.5 g/dL (ref 30.0–36.0)
MCV: 91.3 fL (ref 78.0–100.0)
Platelets: 165 10*3/uL (ref 150–400)
RBC: 4 MIL/uL — ABNORMAL LOW (ref 4.22–5.81)
RDW: 14.4 % (ref 11.5–15.5)
WBC: 6.3 10*3/uL (ref 4.0–10.5)

## 2018-02-19 MED ORDER — DEXAMETHASONE SODIUM PHOSPHATE 4 MG/ML IJ SOLN
4.0000 mg | Freq: Four times a day (QID) | INTRAMUSCULAR | Status: DC
  Administered 2018-02-21 (×2): 4 mg via INTRAVENOUS
  Filled 2018-02-19 (×2): qty 1

## 2018-02-19 MED ORDER — ONDANSETRON HCL 4 MG/2ML IJ SOLN: 4.0000 mg | INTRAMUSCULAR | Status: DC | PRN

## 2018-02-19 MED ORDER — DOCUSATE SODIUM 100 MG PO CAPS: 100.0000 mg | ORAL_CAPSULE | Freq: Two times a day (BID) | ORAL | Status: DC

## 2018-02-19 MED ORDER — METOPROLOL SUCCINATE ER 25 MG PO TB24
25.0000 mg | ORAL_TABLET | Freq: Two times a day (BID) | ORAL | Status: DC
  Administered 2018-02-19 – 2018-02-20 (×2): 25 mg via ORAL
  Filled 2018-02-19 (×3): qty 1

## 2018-02-19 MED ORDER — ATORVASTATIN CALCIUM 20 MG PO TABS
20.0000 mg | ORAL_TABLET | Freq: Every evening | ORAL | Status: DC
  Administered 2018-02-19 – 2018-02-23 (×4): 20 mg via ORAL
  Filled 2018-02-19 (×3): qty 1
  Filled 2018-02-19: qty 2

## 2018-02-19 MED ORDER — PANTOPRAZOLE SODIUM 40 MG PO TBEC: 40.0000 mg | DELAYED_RELEASE_TABLET | Freq: Every evening | ORAL | Status: DC

## 2018-02-19 MED ORDER — FUROSEMIDE 20 MG PO TABS
20.0000 mg | ORAL_TABLET | Freq: Every day | ORAL | Status: DC
  Administered 2018-02-20: 20 mg via ORAL
  Filled 2018-02-19: qty 1

## 2018-02-19 MED ORDER — DOCUSATE SODIUM 100 MG PO CAPS
100.0000 mg | ORAL_CAPSULE | Freq: Two times a day (BID) | ORAL | Status: DC
  Administered 2018-02-19 – 2018-02-24 (×8): 100 mg via ORAL
  Filled 2018-02-19 (×9): qty 1

## 2018-02-19 MED ORDER — DEXAMETHASONE SODIUM PHOSPHATE 4 MG/ML IJ SOLN: 4.0000 mg | Freq: Three times a day (TID) | INTRAMUSCULAR | Status: DC

## 2018-02-19 MED ORDER — DIGOXIN 125 MCG PO TABS
0.1250 mg | ORAL_TABLET | Freq: Every day | ORAL | Status: DC
  Administered 2018-02-20 – 2018-02-24 (×5): 0.125 mg via ORAL
  Filled 2018-02-19 (×6): qty 1

## 2018-02-19 MED ORDER — PANTOPRAZOLE SODIUM 40 MG IV SOLR
40.0000 mg | Freq: Every day | INTRAVENOUS | Status: DC
  Administered 2018-02-19: 40 mg via INTRAVENOUS
  Filled 2018-02-19: qty 40

## 2018-02-19 MED ORDER — LEVETIRACETAM 500 MG PO TABS
500.0000 mg | ORAL_TABLET | Freq: Two times a day (BID) | ORAL | Status: DC
  Administered 2018-02-19 – 2018-02-20 (×2): 500 mg via ORAL
  Filled 2018-02-19 (×2): qty 1

## 2018-02-19 MED ORDER — FINASTERIDE 5 MG PO TABS
5.0000 mg | ORAL_TABLET | Freq: Every day | ORAL | Status: DC
  Administered 2018-02-20 – 2018-02-24 (×5): 5 mg via ORAL
  Filled 2018-02-19 (×5): qty 1

## 2018-02-19 MED ORDER — FLUTICASONE PROPIONATE 50 MCG/ACT NA SUSP
1.0000 | Freq: Every day | NASAL | Status: DC
  Administered 2018-02-23 – 2018-02-24 (×2): 1 via NASAL
  Filled 2018-02-19 (×3): qty 16

## 2018-02-19 MED ORDER — DEXAMETHASONE SODIUM PHOSPHATE 10 MG/ML IJ SOLN
6.0000 mg | Freq: Four times a day (QID) | INTRAMUSCULAR | Status: AC
  Administered 2018-02-19 – 2018-02-20 (×3): 6 mg via INTRAVENOUS
  Filled 2018-02-19 (×3): qty 1

## 2018-02-19 MED ORDER — LACTULOSE 10 GM/15ML PO SOLN
10.0000 g | Freq: Two times a day (BID) | ORAL | Status: DC | PRN
  Administered 2018-02-24: 10 g via ORAL
  Filled 2018-02-19: qty 15

## 2018-02-19 MED ORDER — ENSURE ENLIVE PO LIQD
237.0000 mL | Freq: Two times a day (BID) | ORAL | Status: DC
  Administered 2018-02-21 – 2018-02-24 (×5): 237 mL via ORAL

## 2018-02-19 MED ORDER — LABETALOL HCL 5 MG/ML IV SOLN: 10.0000 mg | INTRAVENOUS | Status: DC | PRN

## 2018-02-19 MED ORDER — ONDANSETRON HCL 4 MG PO TABS: 4.0000 mg | ORAL_TABLET | ORAL | Status: DC | PRN

## 2018-02-19 MED ORDER — PROMETHAZINE HCL 25 MG PO TABS: 12.5000 mg | ORAL_TABLET | ORAL | Status: DC | PRN

## 2018-02-19 MED ORDER — SACUBITRIL-VALSARTAN 24-26 MG PO TABS
1.0000 | ORAL_TABLET | Freq: Two times a day (BID) | ORAL | Status: DC
  Administered 2018-02-19 – 2018-02-20 (×2): 1 via ORAL
  Filled 2018-02-19 (×5): qty 1

## 2018-02-19 NOTE — Consult Note (Signed)
24 y Male with Afib on Coumadin who developed bilateral subdural hematoma s/p craniectomy.  Was seen in the office by Dr Saintclair Halsted  dysarthria and worsening weakness. Was sent to the hospital to obtain CT. Neurology was asked to see patient, however this was before CT head was performed.    CT head showed enlarging left SDH, read as "Increased size of mixed attenuation subdural hematoma over leftcerebral convexity and associated mass effect with 5 mm left-to-right midline shift, previously 3 mm."   After discussion with Dr Arnoldo Morale, he also felt clinical presentation was due to worsening hemorrhage and is no longer requesting a neurology consult.

## 2018-02-19 NOTE — Telephone Encounter (Signed)
New Message   Patients wife is calling in about her spouse. She states that Dr. Saintclair Halsted (neurologist) will be admitting patient to the hospital today. Dr. Saintclair Halsted had an operation about two weeks ago and had some complications. They are not sure if its his brain or his heart. The operations was for a hematoma on both sides of the brain. Please call to discuss.

## 2018-02-19 NOTE — Progress Notes (Addendum)
The patient's repeat his CT demonstrates an enlargement of his left-sided subdural hematoma.  I will plan to do a repeat  Left bur hole versus craniotomy on the patient tomorrow.  I spoke with his nurse.  The patient is also hyponatremic with a sodium of 122. This is not likely helping his neurologic status either.  I will ask the hospitalist to assist this with this.

## 2018-02-19 NOTE — Telephone Encounter (Signed)
Left message to call back  

## 2018-02-19 NOTE — H&P (Signed)
Ryan Savage is an 84 y.o. male.   Chief Complaint: dysarthria and weakness HPI: Ryan Savage who was doingfrom a burr hole craniectoc left-sided subdural hematoma. Patient had a follow-up CT on Monday that was stable minimal mass effect residual 1.5 cm chronic subdural. Patient was doing very well at that point however 48 hours later had more physical exertion on a long walk and it had longtime shoulder thereafter was noted to be hypertensive by a home health aide and then throughout the day had some dysarthria and generalized weakness at the transition to to a walker. We instructed the patient go to the ER that day however he had not outpatient follow-up appointment the next day and he elected to wait. In the office patient says he is getting slightly betterbut still generalized weakness and dysarthria has persisted.  Past Medical History:  Diagnosis Date  . Atrial fibrillation, chronic (HCC)    Not on anticoagulation due to history of bilateral subdural bleeds due to recurrent falls  . BPH (benign prostatic hypertrophy)   . [name] (coronary artery disease), native coronary artery    Severe two-vessel [name] with chronically occluded LAD and RCA with widely patent left main, intermediate, and left circumflex branches.  On medical management.  He has extensive collaterals.  . 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 fib with RVR  . [name] (degenerative joint disease)   . Hyperlipidemia    LDL goal less then 100  . Hypertension   . Microscopic hematuria    [name]  . Mitral valve prolapse    with moderate MR  . OSA (obstructive sleep apnea)    intolerant with CPAP (11/19/2017)  . SIADH (syndrome of inappropriate ADH production) ([place])   . Skin cancer    "side of my nose" (11/19/2017)  . Thrombocytopenia (HCC)    Mild- platelet count  143,000 on 02/2011, stable 08/2011    Past Surgical History:  Procedure Laterality Date  . APPENDECTOMY  1950  . BURR HOLE Bilateral 01/25/2018   Procedure: Haskell Flirt;  Surgeon: Kary Kos, MD;  Location: [facility];  Service: Neurosurgery;  Laterality: Bilateral;  . LUMBAR DISC SURGERY  1959   ruptured disc repair  . RIGHT/LEFT HEART CATH AND CORONARY ANGIOGRAPHY N/A 11/21/2017   Procedure: RIGHT/LEFT HEART CATH AND CORONARY ANGIOGRAPHY;  Surgeon: Sherren Mocha, MD;  Location: [facility];  Service: Cardiovascular;  Laterality: N/A;  . SKIN CANCER EXCISION Left    "side of my nose"    Family History  Problem Relation Age of Onset  . Heart failure Mother   . Heart attack Father   . [name] Father   . Heart attack Brother   . [name] Brother   . Colon cancer Brother   . Colon cancer Sister    Social History:  reports that he quit smoking about Ryan years ago. His smoking use included cigarettes. He quit after 3.00 years of use. He has never used smokeless tobacco. He reports that he does not drink alcohol or use drugs.  Allergies:  Allergies  Allergen Reactions  . Novocain [Procaine] Rash    Medications Prior to Admission  Medication Sig Dispense Refill  . atorvastatin (LIPITOR) 20 MG tablet Take 20 mg by mouth every evening.    . digoxin (LANOXIN) 0.125 MG tablet Take 1 tablet (0.125 mg total) by mouth  daily. 30 tablet 0  . docusate sodium (COLACE) 100 MG capsule Take 1 capsule (100 mg total) by mouth 2 (two) times daily. 10 capsule 0  . finasteride (PROSCAR) 5 MG tablet Take 5 mg by mouth daily.     . fluticasone (FLONASE) 50 MCG/ACT nasal spray Place 1 spray into both nostrils daily.    . furosemide (LASIX) 20 MG tablet Take 1 tablet (20 mg total) by mouth daily. 30 tablet 0  . lactulose (CHRONULAC) 10 GM/15ML solution Take 15 mLs (10 g total) by mouth 2 (two) times daily as needed for mild constipation. 240 mL 0  . metoprolol succinate (TOPROL-XL) 25 MG 24 hr tablet Take 25 mg  by mouth 2 (two) times daily.    . pantoprazole (PROTONIX) 40 MG tablet Take 40 mg by mouth every evening.    . sacubitril-valsartan (ENTRESTO) 24-26 MG Take 1 tablet by mouth 2 (two) times daily. 60 tablet 1    No results found for this or any previous visit (from the past 48 hour(s)). No results found.  Review of Systems  Neurological: Positive for speech change and weakness.    There were no vitals taken for this visit. Physical Exam  Constitutional: He is oriented to person, place, and time. He appears well-developed and well-nourished.  HENT:  Head: Normocephalic.  Eyes: Pupils are equal, round, and reactive to light.  Neck: Normal range of motion.  Cardiovascular: Normal rate.  Respiratory: Effort normal.  GI: Soft. Bowel sounds are normal.  Neurological: He is alert and oriented to person, place, and time. He has normal strength. GCS eye subscore is 4. GCS verbal subscore is 5. GCS motor subscore is 6.  Patient is awake alert pupils are equal excellent limits intact patient is extremely dysarthric but he shows no evidence of dysphasia strength is 5 out of 5 upper and lower extremities with just slight proximal right upper extremities weakness.     Assessment/Plan Ryan Savage with sudden onset dysarthria generalized weakness had a CT scan of his head 48 hours ago which was stable. Will repeat another head CT stat rule out new hemorrhage in his subdural versus new TIA or stroke. We'll place him on Keppra in case this was subclinical seizure activity as well as steroid to help with swelling. Depending on e results of the CT will consult neurology and/or internal medicine  Ryan Savage P, MD 02/19/2018, 5:42 PM

## 2018-02-20 ENCOUNTER — Encounter (HOSPITAL_COMMUNITY)
Admission: AD | Disposition: A | Discharge status: Rehabilitation Facility | Payer: Self-pay | Source: Ambulatory Visit | Attending: Neurosurgery

## 2018-02-20 ENCOUNTER — Encounter (HOSPITAL_COMMUNITY): Payer: Self-pay | Admitting: Nurse Practitioner

## 2018-02-20 ENCOUNTER — Inpatient Hospital Stay (HOSPITAL_COMMUNITY): Payer: Medicare Other | Admitting: Certified Registered"

## 2018-02-20 DIAGNOSIS — R402362 Coma scale, best motor response, obeys commands, at arrival to emergency department: Secondary | ICD-10-CM | POA: Diagnosis present

## 2018-02-20 DIAGNOSIS — E785 Hyperlipidemia, unspecified: Secondary | ICD-10-CM | POA: Diagnosis present

## 2018-02-20 DIAGNOSIS — I10 Essential (primary) hypertension: Secondary | ICD-10-CM | POA: Diagnosis not present

## 2018-02-20 DIAGNOSIS — R531 Weakness: Secondary | ICD-10-CM | POA: Diagnosis present

## 2018-02-20 DIAGNOSIS — S065XAA Traumatic subdural hemorrhage with loss of consciousness status unknown, initial encounter: Secondary | ICD-10-CM | POA: Diagnosis present

## 2018-02-20 DIAGNOSIS — R739 Hyperglycemia, unspecified: Secondary | ICD-10-CM | POA: Diagnosis present

## 2018-02-20 DIAGNOSIS — G4733 Obstructive sleep apnea (adult) (pediatric): Secondary | ICD-10-CM | POA: Diagnosis present

## 2018-02-20 DIAGNOSIS — G8191 Hemiplegia, unspecified affecting right dominant side: Secondary | ICD-10-CM | POA: Diagnosis not present

## 2018-02-20 DIAGNOSIS — R402252 Coma scale, best verbal response, oriented, at arrival to emergency department: Secondary | ICD-10-CM | POA: Diagnosis present

## 2018-02-20 DIAGNOSIS — I959 Hypotension, unspecified: Secondary | ICD-10-CM | POA: Diagnosis present

## 2018-02-20 DIAGNOSIS — D62 Acute posthemorrhagic anemia: Secondary | ICD-10-CM | POA: Diagnosis present

## 2018-02-20 DIAGNOSIS — E222 Syndrome of inappropriate secretion of antidiuretic hormone: Secondary | ICD-10-CM | POA: Diagnosis present

## 2018-02-20 DIAGNOSIS — I6203 Nontraumatic chronic subdural hemorrhage: Secondary | ICD-10-CM | POA: Diagnosis present

## 2018-02-20 DIAGNOSIS — R471 Dysarthria and anarthria: Secondary | ICD-10-CM | POA: Diagnosis present

## 2018-02-20 DIAGNOSIS — I340 Nonrheumatic mitral (valve) insufficiency: Secondary | ICD-10-CM | POA: Diagnosis present

## 2018-02-20 DIAGNOSIS — T380X5A Adverse effect of glucocorticoids and synthetic analogues, initial encounter: Secondary | ICD-10-CM | POA: Diagnosis not present

## 2018-02-20 DIAGNOSIS — I420 Dilated cardiomyopathy: Secondary | ICD-10-CM | POA: Diagnosis present

## 2018-02-20 DIAGNOSIS — I482 Chronic atrial fibrillation: Secondary | ICD-10-CM | POA: Diagnosis present

## 2018-02-20 DIAGNOSIS — E871 Hypo-osmolality and hyponatremia: Secondary | ICD-10-CM | POA: Diagnosis not present

## 2018-02-20 DIAGNOSIS — S065X1S Traumatic subdural hemorrhage with loss of consciousness of 30 minutes or less, sequela: Secondary | ICD-10-CM | POA: Diagnosis not present

## 2018-02-20 DIAGNOSIS — I341 Nonrheumatic mitral (valve) prolapse: Secondary | ICD-10-CM | POA: Diagnosis present

## 2018-02-20 DIAGNOSIS — K219 Gastro-esophageal reflux disease without esophagitis: Secondary | ICD-10-CM | POA: Diagnosis present

## 2018-02-20 DIAGNOSIS — G935 Compression of brain: Secondary | ICD-10-CM | POA: Diagnosis present

## 2018-02-20 DIAGNOSIS — R402142 Coma scale, eyes open, spontaneous, at arrival to emergency department: Secondary | ICD-10-CM | POA: Diagnosis present

## 2018-02-20 DIAGNOSIS — S065X9A Traumatic subdural hemorrhage with loss of consciousness of unspecified duration, initial encounter: Secondary | ICD-10-CM | POA: Diagnosis present

## 2018-02-20 DIAGNOSIS — K5900 Constipation, unspecified: Secondary | ICD-10-CM | POA: Diagnosis present

## 2018-02-20 DIAGNOSIS — N400 Enlarged prostate without lower urinary tract symptoms: Secondary | ICD-10-CM | POA: Diagnosis present

## 2018-02-20 DIAGNOSIS — I5022 Chronic systolic (congestive) heart failure: Secondary | ICD-10-CM | POA: Diagnosis present

## 2018-02-20 DIAGNOSIS — R7989 Other specified abnormal findings of blood chemistry: Secondary | ICD-10-CM | POA: Diagnosis not present

## 2018-02-20 DIAGNOSIS — I2583 Coronary atherosclerosis due to lipid rich plaque: Secondary | ICD-10-CM | POA: Diagnosis not present

## 2018-02-20 DIAGNOSIS — I2510 Atherosclerotic heart disease of native coronary artery without angina pectoris: Secondary | ICD-10-CM | POA: Diagnosis present

## 2018-02-20 DIAGNOSIS — I6200 Nontraumatic subdural hemorrhage, unspecified: Secondary | ICD-10-CM | POA: Diagnosis present

## 2018-02-20 DIAGNOSIS — S065X0D Traumatic subdural hemorrhage without loss of consciousness, subsequent encounter: Secondary | ICD-10-CM | POA: Diagnosis not present

## 2018-02-20 DIAGNOSIS — I255 Ischemic cardiomyopathy: Secondary | ICD-10-CM | POA: Diagnosis present

## 2018-02-20 DIAGNOSIS — I110 Hypertensive heart disease with heart failure: Secondary | ICD-10-CM | POA: Diagnosis present

## 2018-02-20 HISTORY — PX: BURR HOLE: SHX908

## 2018-02-20 LAB — BASIC METABOLIC PANEL
Anion gap: 11 (ref 5–15)
BUN: 13 mg/dL (ref 6–20)
CO2: 24 mmol/L (ref 22–32)
Calcium: 8.7 mg/dL — ABNORMAL LOW (ref 8.9–10.3)
Chloride: 88 mmol/L — ABNORMAL LOW (ref 101–111)
Creatinine, Ser: 0.81 mg/dL (ref 0.61–1.24)
GFR calc Af Amer: >60 mL/min (ref >60)
GFR calc non Af Amer: >60 mL/min (ref >60)
Glucose, Bld: 179 mg/dL — ABNORMAL HIGH (ref 65–99)
Potassium: 4.2 mmol/L (ref 3.5–5.1)
Sodium: 123 mmol/L — ABNORMAL LOW (ref 135–145)

## 2018-02-20 LAB — TYPE AND SCREEN
ABO/RH(D): O POS
Antibody Screen: NEGATIVE

## 2018-02-20 LAB — OSMOLALITY: Osmolality: 265 mOsm/kg — ABNORMAL LOW (ref 275–295)

## 2018-02-20 LAB — BRAIN NATRIURETIC PEPTIDE: B Natriuretic Peptide: 685.1 pg/mL — ABNORMAL HIGH (ref 0.0–100.0)

## 2018-02-20 LAB — SURGICAL PCR SCREEN
MRSA, PCR: NEGATIVE
Staphylococcus aureus: NEGATIVE

## 2018-02-20 LAB — T4, FREE: Free T4: 1.29 ng/dL — ABNORMAL HIGH (ref 0.61–1.12)

## 2018-02-20 LAB — TSH: TSH: 0.224 u[IU]/mL — ABNORMAL LOW (ref 0.350–4.500)

## 2018-02-20 SURGERY — CREATION, CRANIAL BURR HOLE
Anesthesia: General | Laterality: Left

## 2018-02-20 MED ORDER — FENTANYL CITRATE (PF) 250 MCG/5ML IJ SOLN
INTRAMUSCULAR | Status: AC
  Filled 2018-02-20: qty 5

## 2018-02-20 MED ORDER — PROMETHAZINE HCL 25 MG PO TABS: 12.5000 mg | ORAL_TABLET | ORAL | Status: DC | PRN

## 2018-02-20 MED ORDER — EPHEDRINE 5 MG/ML INJ
INTRAVENOUS | Status: AC
  Filled 2018-02-20: qty 10

## 2018-02-20 MED ORDER — FENTANYL CITRATE (PF) 100 MCG/2ML IJ SOLN: 25.0000 ug | INTRAMUSCULAR | Status: DC | PRN

## 2018-02-20 MED ORDER — PHENYLEPHRINE 40 MCG/ML (10ML) SYRINGE FOR IV PUSH (FOR BLOOD PRESSURE SUPPORT)
PREFILLED_SYRINGE | INTRAVENOUS | Status: AC
  Filled 2018-02-20: qty 10

## 2018-02-20 MED ORDER — ONDANSETRON HCL 4 MG/2ML IJ SOLN: 4.0000 mg | INTRAMUSCULAR | Status: DC | PRN

## 2018-02-20 MED ORDER — SUCCINYLCHOLINE CHLORIDE 200 MG/10ML IV SOSY
PREFILLED_SYRINGE | INTRAVENOUS | Status: AC
  Filled 2018-02-20: qty 10

## 2018-02-20 MED ORDER — THROMBIN (RECOMBINANT) 20000 UNITS EX SOLR
CUTANEOUS | Status: DC | PRN
  Administered 2018-02-20: 19:00:00 via TOPICAL

## 2018-02-20 MED ORDER — ONDANSETRON HCL 4 MG PO TABS: 4.0000 mg | ORAL_TABLET | ORAL | Status: DC | PRN

## 2018-02-20 MED ORDER — FENTANYL CITRATE (PF) 250 MCG/5ML IJ SOLN
INTRAMUSCULAR | Status: DC | PRN
  Administered 2018-02-20 (×3): 50 ug via INTRAVENOUS

## 2018-02-20 MED ORDER — ROCURONIUM BROMIDE 10 MG/ML (PF) SYRINGE
PREFILLED_SYRINGE | INTRAVENOUS | Status: DC | PRN
  Administered 2018-02-20: 20 mg via INTRAVENOUS
  Administered 2018-02-20: 30 mg via INTRAVENOUS

## 2018-02-20 MED ORDER — ROCURONIUM BROMIDE 10 MG/ML (PF) SYRINGE
PREFILLED_SYRINGE | INTRAVENOUS | Status: AC
  Filled 2018-02-20: qty 5

## 2018-02-20 MED ORDER — ONDANSETRON HCL 4 MG/2ML IJ SOLN
INTRAMUSCULAR | Status: DC | PRN
  Administered 2018-02-20: 4 mg via INTRAVENOUS

## 2018-02-20 MED ORDER — LIDOCAINE 2% (20 MG/ML) 5 ML SYRINGE
INTRAMUSCULAR | Status: DC | PRN
  Administered 2018-02-20: 60 mg via INTRAVENOUS

## 2018-02-20 MED ORDER — HYDROCODONE-ACETAMINOPHEN 5-325 MG PO TABS
1.0000 | ORAL_TABLET | ORAL | Status: DC | PRN
  Administered 2018-02-20 – 2018-02-23 (×4): 1 via ORAL
  Filled 2018-02-20 (×5): qty 1

## 2018-02-20 MED ORDER — CEFAZOLIN SODIUM-DEXTROSE 2-3 GM-%(50ML) IV SOLR
INTRAVENOUS | Status: DC | PRN
  Administered 2018-02-20: 2 g via INTRAVENOUS

## 2018-02-20 MED ORDER — DEXAMETHASONE SODIUM PHOSPHATE 10 MG/ML IJ SOLN
INTRAMUSCULAR | Status: AC
  Filled 2018-02-20: qty 1

## 2018-02-20 MED ORDER — PROPOFOL 10 MG/ML IV BOLUS
INTRAVENOUS | Status: DC | PRN
  Administered 2018-02-20: 50 mg via INTRAVENOUS

## 2018-02-20 MED ORDER — BACITRACIN ZINC 500 UNIT/GM EX OINT
TOPICAL_OINTMENT | CUTANEOUS | Status: DC | PRN
  Administered 2018-02-20: 1 via TOPICAL

## 2018-02-20 MED ORDER — LABETALOL HCL 5 MG/ML IV SOLN: 10.0000 mg | INTRAVENOUS | Status: DC | PRN

## 2018-02-20 MED ORDER — LACTATED RINGERS IV SOLN: INTRAVENOUS | Status: DC

## 2018-02-20 MED ORDER — MUPIROCIN 2 % EX OINT
1.0000 "application " | TOPICAL_OINTMENT | Freq: Two times a day (BID) | CUTANEOUS | Status: AC
  Administered 2018-02-21 – 2018-02-22 (×3): 1 via NASAL
  Filled 2018-02-20 (×2): qty 22

## 2018-02-20 MED ORDER — PROPOFOL 10 MG/ML IV BOLUS
INTRAVENOUS | Status: AC
  Filled 2018-02-20: qty 20

## 2018-02-20 MED ORDER — POTASSIUM CHLORIDE IN NACL 20-0.9 MEQ/L-% IV SOLN
INTRAVENOUS | Status: DC
  Administered 2018-02-20: 21:00:00 via INTRAVENOUS
  Filled 2018-02-20: qty 1000

## 2018-02-20 MED ORDER — SODIUM CHLORIDE 0.9 % IV SOLN
INTRAVENOUS | Status: DC | PRN
  Administered 2018-02-20: 17:00:00 via INTRAVENOUS

## 2018-02-20 MED ORDER — PHENYLEPHRINE HCL 10 MG/ML IJ SOLN
INTRAVENOUS | Status: DC | PRN
  Administered 2018-02-20: 25 ug/min via INTRAVENOUS

## 2018-02-20 MED ORDER — PANTOPRAZOLE SODIUM 40 MG IV SOLR
40.0000 mg | Freq: Every day | INTRAVENOUS | Status: DC
  Administered 2018-02-20: 40 mg via INTRAVENOUS
  Filled 2018-02-20: qty 40

## 2018-02-20 MED ORDER — ACETAMINOPHEN 325 MG PO TABS
650.0000 mg | ORAL_TABLET | ORAL | Status: DC | PRN
  Administered 2018-02-21 – 2018-02-24 (×5): 650 mg via ORAL
  Filled 2018-02-20 (×5): qty 2

## 2018-02-20 MED ORDER — CEFAZOLIN SODIUM-DEXTROSE 2-4 GM/100ML-% IV SOLN
2.0000 g | Freq: Three times a day (TID) | INTRAVENOUS | Status: AC
  Administered 2018-02-20 – 2018-02-21 (×2): 2 g via INTRAVENOUS
  Filled 2018-02-20 (×2): qty 100

## 2018-02-20 MED ORDER — SUGAMMADEX SODIUM 200 MG/2ML IV SOLN
INTRAVENOUS | Status: DC | PRN
  Administered 2018-02-20: 200 mg via INTRAVENOUS

## 2018-02-20 MED ORDER — SODIUM CHLORIDE 0.9 % IV SOLN
INTRAVENOUS | Status: DC
  Administered 2018-02-20: 17:00:00 via INTRAVENOUS

## 2018-02-20 MED ORDER — ACETAMINOPHEN 650 MG RE SUPP: 650.0000 mg | RECTAL | Status: DC | PRN

## 2018-02-20 MED ORDER — LEVETIRACETAM IN NACL 500 MG/100ML IV SOLN
500.0000 mg | Freq: Two times a day (BID) | INTRAVENOUS | Status: DC
  Administered 2018-02-21 (×2): 500 mg via INTRAVENOUS
  Filled 2018-02-20 (×2): qty 100

## 2018-02-20 MED ORDER — SUGAMMADEX SODIUM 200 MG/2ML IV SOLN
INTRAVENOUS | Status: AC
  Filled 2018-02-20: qty 2

## 2018-02-20 MED ORDER — 0.9 % SODIUM CHLORIDE (POUR BTL) OPTIME
TOPICAL | Status: DC | PRN
  Administered 2018-02-20 (×4): 1000 mL

## 2018-02-20 MED ORDER — BACITRACIN ZINC 500 UNIT/GM EX OINT
TOPICAL_OINTMENT | CUTANEOUS | Status: AC
  Filled 2018-02-20: qty 28.35

## 2018-02-20 MED ORDER — FUROSEMIDE 10 MG/ML IJ SOLN
40.0000 mg | Freq: Every day | INTRAMUSCULAR | Status: DC
  Administered 2018-02-20: 40 mg via INTRAVENOUS
  Filled 2018-02-20: qty 4

## 2018-02-20 MED ORDER — BUPIVACAINE-EPINEPHRINE 0.5% -1:200000 IJ SOLN
INTRAMUSCULAR | Status: DC | PRN
  Administered 2018-02-20: 7 mL

## 2018-02-20 MED ORDER — MORPHINE SULFATE (PF) 4 MG/ML IV SOLN: 1.0000 mg | INTRAVENOUS | Status: DC | PRN

## 2018-02-20 MED ORDER — ONDANSETRON HCL 4 MG/2ML IJ SOLN
INTRAMUSCULAR | Status: AC
  Filled 2018-02-20: qty 2

## 2018-02-20 MED ORDER — CHLORHEXIDINE GLUCONATE CLOTH 2 % EX PADS
6.0000 | MEDICATED_PAD | Freq: Every day | CUTANEOUS | Status: DC
  Administered 2018-02-20: 6 via TOPICAL

## 2018-02-20 MED ORDER — LIDOCAINE 2% (20 MG/ML) 5 ML SYRINGE
INTRAMUSCULAR | Status: AC
  Filled 2018-02-20: qty 5

## 2018-02-20 MED ORDER — SODIUM CHLORIDE 0.9 % IV SOLN
INTRAVENOUS | Status: DC | PRN
  Administered 2018-02-20: 19:00:00

## 2018-02-20 MED ORDER — THROMBIN 20000 UNITS EX SOLR
CUTANEOUS | Status: AC
  Filled 2018-02-20: qty 20000

## 2018-02-20 MED ORDER — BUPIVACAINE-EPINEPHRINE (PF) 0.5% -1:200000 IJ SOLN
INTRAMUSCULAR | Status: AC
  Filled 2018-02-20: qty 30

## 2018-02-20 MED ORDER — PHENYLEPHRINE HCL 10 MG/ML IJ SOLN
INTRAMUSCULAR | Status: DC | PRN
  Administered 2018-02-20 (×2): 80 ug via INTRAVENOUS

## 2018-02-20 SURGICAL SUPPLY — 65 items
BAG DECANTER FOR FLEXI CONT (MISCELLANEOUS) IMPLANT
BIT DRILL WIRE PASS 1.3MM (BIT) IMPLANT
BLADE CLIPPER SPEC (BLADE) ×2 IMPLANT
BUR PRECISION FLUTE 6.0 (BURR) ×2 IMPLANT
BUR SPIRAL ROUTER 2.3 (BUR) IMPLANT
CANISTER SUCT 3000ML PPV (MISCELLANEOUS) ×2 IMPLANT
CARTRIDGE OIL MAESTRO DRILL (MISCELLANEOUS) ×1 IMPLANT
CLIP VESOCCLUDE MED 6/CT (CLIP) IMPLANT
COVER BACK TABLE 60X90IN (DRAPES) IMPLANT
DIFFUSER DRILL AIR PNEUMATIC (MISCELLANEOUS) ×2 IMPLANT
DRAPE NEUROLOGICAL W/INCISE (DRAPES) ×2 IMPLANT
DRAPE SURG 17X23 STRL (DRAPES) IMPLANT
DRAPE WARM FLUID 44X44 (DRAPE) ×2 IMPLANT
DRILL WIRE PASS 1.3MM (BIT)
ELECT REM PT RETURN 9FT ADLT (ELECTROSURGICAL) ×2
ELECTRODE REM PT RTRN 9FT ADLT (ELECTROSURGICAL) ×1 IMPLANT
EVACUATOR 1/8 PVC DRAIN (DRAIN) IMPLANT
EVACUATOR SILICONE 100CC (DRAIN) IMPLANT
GAUZE SPONGE 4X4 12PLY STRL (GAUZE/BANDAGES/DRESSINGS) ×2 IMPLANT
GAUZE SPONGE 4X4 16PLY XRAY LF (GAUZE/BANDAGES/DRESSINGS) IMPLANT
GLOVE BIO SURGEON STRL SZ8 (GLOVE) ×2 IMPLANT
GLOVE BIO SURGEON STRL SZ8.5 (GLOVE) ×2 IMPLANT
GLOVE BIOGEL PI IND STRL 6.5 (GLOVE) ×1 IMPLANT
GLOVE BIOGEL PI IND STRL 7.5 (GLOVE) ×3 IMPLANT
GLOVE BIOGEL PI IND STRL 8 (GLOVE) ×3 IMPLANT
GLOVE BIOGEL PI INDICATOR 6.5 (GLOVE) ×1
GLOVE BIOGEL PI INDICATOR 7.5 (GLOVE) ×3
GLOVE BIOGEL PI INDICATOR 8 (GLOVE) ×3
GLOVE ECLIPSE 7.0 STRL STRAW (GLOVE) ×4 IMPLANT
GLOVE EXAM NITRILE LRG STRL (GLOVE) IMPLANT
GLOVE EXAM NITRILE XL STR (GLOVE) IMPLANT
GLOVE EXAM NITRILE XS STR PU (GLOVE) IMPLANT
GLOVE SURG SS PI 6.0 STRL IVOR (GLOVE) ×4 IMPLANT
GOWN STRL REUS W/ TWL LRG LVL3 (GOWN DISPOSABLE) IMPLANT
GOWN STRL REUS W/ TWL XL LVL3 (GOWN DISPOSABLE) IMPLANT
GOWN STRL REUS W/TWL LRG LVL3 (GOWN DISPOSABLE)
GOWN STRL REUS W/TWL XL LVL3 (GOWN DISPOSABLE)
KIT BASIN OR (CUSTOM PROCEDURE TRAY) ×2 IMPLANT
KIT TURNOVER KIT B (KITS) ×2 IMPLANT
MARKER SKIN DUAL TIP RULER LAB (MISCELLANEOUS) IMPLANT
NEEDLE HYPO 22GX1.5 SAFETY (NEEDLE) ×2 IMPLANT
NS IRRIG 1000ML POUR BTL (IV SOLUTION) ×2 IMPLANT
OIL CARTRIDGE MAESTRO DRILL (MISCELLANEOUS) ×2
PACK CRANIOTOMY CUSTOM (CUSTOM PROCEDURE TRAY) ×2 IMPLANT
PAD ARMBOARD 7.5X6 YLW CONV (MISCELLANEOUS) ×2 IMPLANT
PATTIES SURGICAL .25X.25 (GAUZE/BANDAGES/DRESSINGS) IMPLANT
PATTIES SURGICAL .5 X.5 (GAUZE/BANDAGES/DRESSINGS) IMPLANT
PATTIES SURGICAL .5 X3 (DISPOSABLE) IMPLANT
PATTIES SURGICAL 1X1 (DISPOSABLE) IMPLANT
PIN MAYFIELD SKULL DISP (PIN) IMPLANT
RUBBERBAND STERILE (MISCELLANEOUS) ×4 IMPLANT
SPONGE NEURO XRAY DETECT 1X3 (DISPOSABLE) IMPLANT
STAPLER SKIN PROX WIDE 3.9 (STAPLE) ×2 IMPLANT
SUT ETHILON 3 0 FSL (SUTURE) IMPLANT
SUT NURALON 4 0 TR CR/8 (SUTURE) ×4 IMPLANT
SUT PROLENE 6 0 BV (SUTURE) IMPLANT
SUT VIC AB 2-0 CP2 18 (SUTURE) ×2 IMPLANT
SUT VIC AB 3-0 FS2 27 (SUTURE) ×2 IMPLANT
SUT VICRYL 4-0 PS2 18IN ABS (SUTURE) IMPLANT
TAPE CLOTH SURG 4X10 WHT LF (GAUZE/BANDAGES/DRESSINGS) ×2 IMPLANT
TOWEL GREEN STERILE (TOWEL DISPOSABLE) ×2 IMPLANT
TOWEL GREEN STERILE FF (TOWEL DISPOSABLE) ×2 IMPLANT
TRAY FOLEY W/METER SILVER 16FR (SET/KITS/TRAYS/PACK) IMPLANT
UNDERPAD 30X30 (UNDERPADS AND DIAPERS) IMPLANT
WATER STERILE IRR 1000ML POUR (IV SOLUTION) ×2 IMPLANT

## 2018-02-20 NOTE — Anesthesia Procedure Notes (Signed)
Procedure Name: Intubation Date/Time: 02/20/2018 5:39 PM Performed by: Clearnce Sorrel, CRNA Pre-anesthesia Checklist: Patient identified, Emergency Drugs available, Suction available, Patient being monitored and Timeout performed Patient Re-evaluated:Patient Re-evaluated prior to induction Oxygen Delivery Method: Circle system utilized Preoxygenation: Pre-oxygenation with 100% oxygen Induction Type: IV induction Ventilation: Oral airway inserted - appropriate to patient size and Mask ventilation without difficulty Laryngoscope Size: Mac and 3 Grade View: Grade I Tube size: 7.5 mm Number of attempts: 1 Airway Equipment and Method: Stylet Placement Confirmation: ETT inserted through vocal cords under direct vision,  positive ETCO2 and breath sounds checked- equal and bilateral Secured at: 23 cm Tube secured with: Tape Dental Injury: Teeth and Oropharynx as per pre-operative assessment

## 2018-02-20 NOTE — Progress Notes (Signed)
[facility]  Patient Status: Active (receiving services up to time of hospitalization)  [facility] is providing the following services: RN and PT  If patient discharges after hours, please call [phone].   Ryan Savage 02/20/2018, 9:59 AM

## 2018-02-20 NOTE — Op Note (Signed)
Brief history: The patient is an 84 year old white male who has had bilateral bur holes by Dr. Saintclair Halsted for evacuation of subdural hematomas. The patient has had a progressive neurologic decline. A follow-up head CT demonstrated a recurrent and enlarging left subdural hematoma. I discussed the situation with the patient and his family. I recommended surgery. They have weighed the risks, benefits, and alternatives to surgery and decided to proceed with redo bur holes for evacuation of the subdural hematoma.  Preop diagnosis: Left subdural hematoma  Postop diagnosis: The same  Procedure: Left bur holes for evacuation of subdural hematoma  Surgeon: Dr. Earle Gell  Assistant: Dr. Kathyrn Sheriff  Anesthesia: Gen. tracheal  Estimated blood loss: Minimal  Specimens: None  Drains: None  Consultations: None  Description of procedure: The patient was brought to the operating room by the anesthesia team. General endotracheal anesthesia was induced. I applied the [place] 3. headrest that the patient's calvarium. A roll was placed under his left shoulder and his head was turned to the right exposing his left scalp. His left scalp was shaved with clippers and prepared with Betadine scrub and Betadine solution. Sterile drapes were applied. I then injected the area to be incised with Marcaine with epinephrine solution. I used a scalpel to incise the patient's old left anterior bur hole incision. I used a Geologist, engineering for exposure. I then made a second incision more posteriorly and used a cerebellar retractor for exposure. We exposed the underlying calvarium with the electrocautery. I then used a high-speed drill to widen the patient's pre-existing left frontal burr hole and to create a new left posterior parietal burr hole. I then coagulated the dura with electrocautery. There was release of chronic subdural hematoma fluid under high pressure. I rinsed out the subdural space with irrigation and suction. We  encountered subdural membranes which we communicated using light cautery we did not encounter any active bleeding. We irrigated from the anterior to posterior burr hole and from the posterior to anterior bur hole. The irrigant came back crystal clear. We then placed Gelfoam over the bur holes and then removed the retractors. We reapproximated the galea with interrupted 2-0 Vicryl suture. We reapproximated the skin with stainless steel staples. The wounds were then coated with bacitracin ointment. Sterile dressings were applied. The drapes were removed. I then removed the [place] 3. headrest from the patient's calvarium. By report all sponge, instrument and needle counts were correct at the end of this case.

## 2018-02-20 NOTE — Anesthesia Preprocedure Evaluation (Addendum)
Anesthesia Evaluation  Patient identified by MRN, date of birth, ID band Patient awake    Reviewed: Allergy & Precautions, H&P , NPO status , Patient's Chart, lab work & pertinent test results, reviewed documented beta blocker date and time   Airway Mallampati: III  TM Distance: >3 FB Neck ROM: Full    Dental no notable dental hx. (+) Teeth Intact, Dental Advisory Given   Pulmonary sleep apnea , former smoker,    Pulmonary exam normal breath sounds clear to auscultation       Cardiovascular hypertension, Pt. on medications and Pt. on home beta blockers + [name], + Past MI, + Peripheral Vascular Disease and +CHF   Rhythm:Regular Rate:Normal     Neuro/Psych CVA, No Residual Symptoms negative psych ROS   GI/Hepatic Neg liver ROS, GERD  Medicated and Controlled,  Endo/Other  Hyperthyroidism SIADH  Renal/GU negative Renal ROS  negative genitourinary   Musculoskeletal  (+) Arthritis , Osteoarthritis,    Abdominal   Peds  Hematology negative hematology ROS (+)   Anesthesia Other Findings   Reproductive/Obstetrics negative OB ROS                           Anesthesia Physical Anesthesia Plan  ASA: III  Anesthesia Plan: General   Post-op Pain Management:    Induction: Intravenous  PONV Risk Score and Plan: 3 and Ondansetron, Dexamethasone and Treatment may vary due to age or medical condition  Airway Management Planned: Oral ETT  Additional Equipment:   Intra-op Plan:   Post-operative Plan: Extubation in OR  Informed Consent: I have reviewed the patients History and Physical, chart, labs and discussed the procedure including the risks, benefits and alternatives for the proposed anesthesia with the patient or authorized representative who has indicated his/her understanding and acceptance.   Dental advisory given  Plan Discussed with: CRNA  Anesthesia Plan Comments:          Anesthesia Quick Evaluation

## 2018-02-20 NOTE — Progress Notes (Addendum)
Initial Nutrition Assessment  DOCUMENTATION CODES:   Non-severe (moderate) malnutrition in context of chronic illness  INTERVENTION:   Ensure Enlive po BID, each supplement provides 350 kcal and 20 grams of protein  Provided nutrition education  NUTRITION DIAGNOSIS:   Moderate Malnutrition related to chronic illness(CHF) as evidenced by moderate fat depletion, moderate muscle depletion  GOAL:   Patient will meet greater than or equal to 90% of their needs  MONITOR:   Weight trends, PO intake, Labs, I & O's  REASON FOR ASSESSMENT:   Malnutrition Screening Tool    ASSESSMENT:   Pt with PMH of CHF (EF 30-35%), [name], GERD, HTN, HLD, BPH presents with SIADH and an enlarging L subdural hematoma for burr holes 4/12   Spoke with pt, pt's wife and pt's son at bedside.   Pt has had low intake since January but it is starting to increase. Pt diet order was NPO at visit and he was reporting hunger. Pt's wife has been restricting pt's sodium consumption to < 1500 mg/d and a lot of times 1000 mg/d. This impacts the taste of the food therefore the patient has been eating less. RD discussed the recommended sodium levels of <2000 mg/d. Discussed slightly liberalizing pt's diet to promote intake, especially given pt's low blood pressure and sodium lab readings.   Pt endorses weight loss of ~ 20 lbs. Together they report 15 lbs was from heart failure and being put on Lasix and the other 5 lb was from poor intake. Unable to use weight changes as malnutrition criteria. Pt at risk for more severe nutrition diagnosis.   Labs reviewed; CBG 130-200, Na 123, Chloride 88 Medications reviewed; Decadron, 40 mg IV Lasix, Colace, Protonix   NUTRITION - FOCUSED PHYSICAL EXAM:    Most Recent Value  Orbital Region  Moderate depletion  Upper Arm Region  Severe depletion  Thoracic and Lumbar Region  Moderate depletion  Buccal Region  Moderate depletion  Temple Region  Moderate depletion  Clavicle Bone  Region  Severe depletion  Clavicle and Acromion Bone Region  Moderate depletion  Scapular Bone Region  Unable to assess  Dorsal Hand  Moderate depletion  Patellar Region  Moderate depletion  Anterior Thigh Region  Moderate depletion  Posterior Calf Region  Moderate depletion  Edema (RD Assessment)  None  Hair  Reviewed  Eyes  Reviewed  Mouth  Reviewed  Skin  Reviewed  Nails  Reviewed     Diet Order:  Diet NPO time specified  EDUCATION NEEDS:   Education needs have been addressed  Skin:  Skin Assessment: Reviewed RN Assessment  Last BM:  02/18/18  Height:   Ht Readings from Last 1 Encounters:  02/05/18 5\' 10"  (1.778 m)   Weight:   Wt Readings from Last 1 Encounters:  02/20/18 166 lb 0.1 oz (75.3 kg)   Ideal Body Weight:  75.45 kg  BMI:  Body mass index is 23.82 kg/m.  Estimated Nutritional Needs:   Kcal:  1900-2100  Protein:  95-105 grams  Fluid:  >/= 1.9 L/d  Parks Ranger, MS, RDN, LDN 02/20/2018 4:38 PM

## 2018-02-20 NOTE — Transfer of Care (Signed)
Immediate Anesthesia Transfer of Care Note  Patient: Ryan Savage  Procedure(s) Performed: BURR HOLES for subdural hematoma (Left )  Patient Location: PACU  Anesthesia Type:General  Level of Consciousness: awake, alert , oriented and patient cooperative  Airway & Oxygen Therapy: Patient Spontanous Breathing and Patient connected to nasal cannula oxygen  Post-op Assessment: Report given to RN and Post -op Vital signs reviewed and stable  Post vital signs: Reviewed and stable  Last Vitals:  Vitals Value Taken Time  BP 111/84 02/20/2018  7:05 PM  Temp    Pulse 83 02/20/2018  7:06 PM  Resp 13 02/20/2018  7:06 PM  SpO2 100 % 02/20/2018  7:06 PM  Vitals shown include unvalidated device data.  Last Pain:  Vitals:   02/20/18 1214  TempSrc: Oral  PainSc:          Complications: No apparent anesthesia complications

## 2018-02-20 NOTE — Consult Note (Addendum)
Consultation Note   Ryan Savage WEX:[id number] DOB: 02/27/1929 DOA: 02/19/2018   PCP: Lajean Manes, MD   Consulting physician: Allyson Sabal  Requesting physician: Arnoldo Morale  Reason for consultation: Recurrent hyponatremia  HPI: Ryan Savage is a 84 y.o. male with medical history significant for ischemic cardiomyopathy with EF 30-35%, mild mitral valve prolapse confirmed by cardiac catheterization, chronic atrial fibrillation no longer on anticoagulation secondary to subdural hematoma status post burr holes on 3/17, hypertension, [name] tonically occluded LAD and RCA with extensive collaterals, dyslipidemia, BPH, [name], GERD and SIADH.  Patient was admitted to the hospital on 4/11 for dysarthria and weakness.  Patient had undergone previous burr hole craniectomy for left-sided subdural hematomas that developed spontaneously while on Coumadin anticoagulation.  Worsening neurological symptoms as described.  Neurology was consulted.  CT the head showed enlarging left subdural hematoma with a 5 mm left to right midline shift where it was previously 3 mm.  Symptoms felt related to worsening hemorrhage and therefore neurology signed off.  Medicine team has been consulted because of hyponatremia.  Patient has a history of SIADH (presumed) noting initial diagnosis in January during hospitalization for extensive volume overload secondary to heart failure exacerbation.  Lab data obtained during that admission potentially skewed by concomitant utilization of Lasix.  Discharge summary mentioned stability of holding Lasix and obtaining full SIADH labs in the outpatient setting but this has not been accomplished.  Presenting sodium this admission on 4/11 was 122.   Review of Systems:  In addition to the HPI above,  No Fever-chills, myalgias or other constitutional symptoms No Headache, changes with Vision or hearing, new weakness, tingling, numbness in any extremity, dizziness, dysarthria or word finding difficulty,  tremors or seizure activity No problems swallowing food or Liquids, indigestion/reflux, choking or coughing while eating, abdominal pain with or after eating No Chest pain, Cough or Shortness of Breath, palpitations, orthopnea or DOE No Abdominal pain, N/V, melena,hematochezia, dark tarry stools, constipation No dysuria, malodorous urine, hematuria or flank pain No new skin rashes, lesions, masses or bruises, No new joint pains, aches, swelling or redness No recent unintentional weight gain or loss No polyuria, polydypsia or polyphagia   Past Medical History:  Diagnosis Date  . Atrial fibrillation, chronic (HCC)    Not on anticoagulation due to history of bilateral subdural bleeds due to recurrent falls  . BPH (benign prostatic hypertrophy)   . [name] (coronary artery disease), native coronary artery    Severe two-vessel [name] with chronically occluded LAD and RCA with widely patent left main, intermediate, and left circumflex branches.  On medical management.  He has extensive collaterals.  . Chronic systolic heart failure ([place])    Ischemic dilated cardia myopathy with EF 30-35% by 2D echocardiogram 11/2017 felt due to a combination of ischemia as well as tachycardia induced from A. fib with RVR  . [name] (degenerative joint disease)   . GERD (gastroesophageal reflux disease)   . Hyperlipidemia    LDL goal less then 100  . Hypertension   . Microscopic hematuria    [name]  . Mitral valve prolapse    with moderate MR  . Myocardial infarction [facility])    "was told 11/2017 that he'd had a heart attack; don't know when it was" (02/19/2018)  . OSA (obstructive sleep apnea)    intolerant with CPAP (02/19/2018)  . SDH (subdural hematoma) ([place]) 01/2018  . SIADH (syndrome of inappropriate ADH production) ([place])   . Skin cancer    "side of my nose" (11/19/2017)  .  Stroke ([place]) 12/2017   "fully recovered"  . Thrombocytopenia (HCC)    Mild- platelet count 143,000 on 02/2011, stable 08/2011    Past  Surgical History:  Procedure Laterality Date  . APPENDECTOMY  1950  . BACK SURGERY    . BURR HOLE Bilateral 01/25/2018   Procedure: Haskell Flirt;  Surgeon: Kary Kos, MD;  Location: [place];  Service: Neurosurgery;  Laterality: Bilateral;  . FOREARM FRACTURE SURGERY Right 1954  . LUMBAR DISC SURGERY  1959   ruptured disc repair  . RIGHT/LEFT HEART CATH AND CORONARY ANGIOGRAPHY N/A 11/21/2017   Procedure: RIGHT/LEFT HEART CATH AND CORONARY ANGIOGRAPHY;  Surgeon: Sherren Mocha, MD;  Location: [facility];  Service: Cardiovascular;  Laterality: N/A;  . SKIN CANCER EXCISION Left    "side of my nose"    Social History   Socioeconomic History  . Marital status: Married    Spouse name: Not on file  . Number of children: Not on file  . Years of education: Not on file  . Highest education level: Not on file  Occupational History  . Occupation: retired  Scientific laboratory technician  . Financial resource strain: Not on file  . Food insecurity:    Worry: Not on file    Inability: Not on file  . Transportation needs:    Medical: Not on file    Non-medical: Not on file  Tobacco Use  . Smoking status: Former Smoker    Years: 3.00    Types: Cigarettes    Last attempt to quit: 1958    Years since quitting: 61.3  . Smokeless tobacco: Never Used  Substance and Sexual Activity  . Alcohol use: No  . Drug use: No  . Sexual activity: Not on file  Lifestyle  . Physical activity:    Days per week: Not on file    Minutes per session: Not on file  . Stress: Not on file  Relationships  . Social connections:    Talks on phone: Not on file    Gets together: Not on file    Attends religious service: Not on file    Active member of club or organization: Not on file    Attends meetings of clubs or organizations: Not on file    Relationship status: Not on file  . Intimate partner violence:    Fear of current or ex partner: Not on file    Emotionally abused: Not on file    Physically abused: Not on file     Forced sexual activity: Not on file  Other Topics Concern  . Not on file  Social History Narrative  . Not on file    Mobility: Rolling walker Work history:  Not obtained   Allergies  Allergen Reactions  . Novocain [Procaine] Rash    Family History  Problem Relation Age of Onset  . Heart failure Mother   . Heart attack Father   . [name] Father   . Heart attack Brother   . [name] Brother   . Colon cancer Brother   . Colon cancer Sister      Prior to Admission medications   Medication Sig Start Date End Date Taking? Authorizing Provider  acetaminophen (TYLENOL) 500 MG tablet Take 1,000 mg by mouth as needed for mild pain.   Yes [provider]  atorvastatin (LIPITOR) 20 MG tablet Take 20 mg by mouth every evening.   Yes [provider]  digoxin (LANOXIN) 0.125 MG tablet Take 1 tablet (0.125 mg total)  by mouth daily. 11/28/17  Yes Allie Bossier, MD  docusate sodium (COLACE) 100 MG capsule Take 1 capsule (100 mg total) by mouth 2 (two) times daily. 01/29/18  Yes Kary Kos, MD  finasteride (PROSCAR) 5 MG tablet Take 5 mg by mouth daily.  09/06/13  Yes [provider]  fluticasone (FLONASE) 50 MCG/ACT nasal spray Place 1 spray into both nostrils daily.   Yes [provider]  furosemide (LASIX) 20 MG tablet Take 1 tablet (20 mg total) by mouth daily. 11/28/17  Yes Allie Bossier, MD  lactulose (CHRONULAC) 10 GM/15ML solution Take 15 mLs (10 g total) by mouth 2 (two) times daily as needed for mild constipation. 11/27/17  Yes Allie Bossier, MD  metoprolol succinate (TOPROL-XL) 25 MG 24 hr tablet Take 25 mg by mouth 2 (two) times daily.   Yes [provider]  neomycin-bacitracin-polymyxin (NEOSPORIN) ointment Apply 1 application topically every 12 (twelve) hours.   Yes [provider]  pantoprazole (PROTONIX) 40 MG tablet Take 40 mg by mouth every evening.   Yes [provider]  sacubitril-valsartan (ENTRESTO) 24-26 MG Take  1 tablet by mouth 2 (two) times daily. 02/05/18  Yes Sueanne Margarita, MD    Physical Exam: Vitals:   02/19/18 2136 02/19/18 2359 02/20/18 0447 02/20/18 0823  BP: 110/82 104/64 97/67 107/80  Pulse: 81 72 73 70  Resp: 16 16 18 18   Temp: 97.7 F (36.5 C) 97.6 F (36.4 C) 97.6 F (36.4 C) 97.7 F (36.5 C)  TempSrc: Oral Oral Oral Oral  SpO2: 99% 99% 100% 99%      Constitutional: NAD, calm, comfortable-appears younger than stated age Eyes: PERRL, lids and conjunctivae normal ENMT: Mucous membranes are moist. Posterior pharynx clear of any exudate or lesions.age-appropriate dentition.  Neck: normal, supple, no masses, no thyromegaly Respiratory: clear to auscultation bilaterally, no wheezing, no crackles. Normal respiratory effort. No accessory muscle use.  Cardiovascular: Regular rate and rhythm, no murmurs / rubs / gallops. No extremity edema. 2+ pedal pulses. No carotid bruits.  Abdomen: no tenderness, no masses palpated. No hepatosplenomegaly. Bowel sounds positive.  Genitourinary: Condom catheter in place draining clear yellow urine to bedside bag Musculoskeletal: no clubbing / cyanosis. No joint deformity upper and lower extremities. Good ROM, no contractures. Normal muscle tone.  Skin: no rashes, lesions, ulcers. No induration Neurologic: CN 2-12 grossly intact. Sensation intact, DTR normal. Strength 5/5 x all 4 extremities.  Psychiatric: Alert and oriented x name and place. Normal mood.    Labs on Admission: I have personally reviewed following labs and imaging studies  CBC: Recent Labs  Lab 02/19/18 1827  WBC 6.3  HGB 12.6*  HCT 36.5*  MCV 91.3  PLT 601   Basic Metabolic Panel: Recent Labs  Lab 02/19/18 1827 02/20/18 0437  NA 122* 123*  K 4.3 4.2  CL 86* 88*  CO2 27 24  GLUCOSE 104* 179*  BUN 13 13  CREATININE 0.84 0.81  CALCIUM 8.6* 8.7*   GFR: CrCl cannot be calculated (Unknown ideal weight.). Liver Function Tests: No results for input(s): AST, ALT,  ALKPHOS, BILITOT, PROT, ALBUMIN in the last 168 hours. No results for input(s): LIPASE, AMYLASE in the last 168 hours. No results for input(s): AMMONIA in the last 168 hours. Coagulation Profile: No results for input(s): INR, PROTIME in the last 168 hours. Cardiac Enzymes: No results for input(s): CKTOTAL, CKMB, CKMBINDEX, TROPONINI in the last 168 hours. BNP (last 3 results) No results for input(s): PROBNP in  the last 8760 hours. HbA1C: No results for input(s): HGBA1C in the last 72 hours. CBG: No results for input(s): GLUCAP in the last 168 hours. Lipid Profile: No results for input(s): CHOL, HDL, LDLCALC, TRIG, CHOLHDL, LDLDIRECT in the last 72 hours. Thyroid Function Tests: No results for input(s): TSH, T4TOTAL, FREET4, T3FREE, THYROIDAB in the last 72 hours. Anemia Panel: No results for input(s): VITAMINB12, FOLATE, FERRITIN, TIBC, IRON, RETICCTPCT in the last 72 hours. Urine analysis:    Component Value Date/Time   COLORURINE YELLOW 01/23/2018 1310   APPEARANCEUR CLEAR 01/23/2018 1310   LABSPEC 1.009 01/23/2018 1310   PHURINE 7.0 01/23/2018 1310   GLUCOSEU NEGATIVE 01/23/2018 1310   HGBUR SMALL (A) 01/23/2018 1310   BILIRUBINUR NEGATIVE 01/23/2018 1310   KETONESUR NEGATIVE 01/23/2018 1310   PROTEINUR NEGATIVE 01/23/2018 1310   NITRITE NEGATIVE 01/23/2018 1310   LEUKOCYTESUR NEGATIVE 01/23/2018 1310   Sepsis Labs: @LABRCNTIP (procalcitonin:4,lacticidven:4) )No results found for this or any previous visit (from the past 240 hour(s)).   Radiological Exams on Admission: Ct Head Wo Contrast  Result Date: 02/19/2018 CLINICAL DATA:  84 y/o  M; follow-up of subdural hemorrhage. EXAM: CT HEAD WITHOUT CONTRAST TECHNIQUE: Contiguous axial images were obtained from the base of the skull through the vertex without intravenous contrast. COMPARISON:  02/16/2018 CT head. FINDINGS: Brain: Increased size of mixed attenuation subdural hematoma over the left cerebral convexity,  predominantly low in attenuation, with multiple high attenuation synechiae and areas of retracted clot. The collection measures up to 21 mm, previously 15 mm. Stable thin subdural hematoma thickening over the right cerebral convexity. No new area of acute intracranial hemorrhage. Mass effect associated with the left convexity subdural hematoma results in partial effacement of left lateral ventricle and 5 mm of left-to-right midline shift. Decreased in size and attenuation of right cerebellar hemisphere chronic hematoma. Stable chronic microvascular ischemic changes and parenchymal volume loss of the brain. No downward herniation. Vascular: Calcific atherosclerosis of carotid siphons. Skull: Multiple bilateral burr holes.  No acute fracture. Sinuses/Orbits: No acute finding. Other: None. IMPRESSION: 1. Increased size of mixed attenuation subdural hematoma over left cerebral convexity and associated mass effect with 5 mm left-to-right midline shift, previously 3 mm. 2. Stable thin subdural hematoma/dural thickening over right cerebral convexity. 3. Decreased size of chronic hematoma in right cerebellar hemisphere. 4. Otherwise no no acute intracranial abnormality identified. These results will be called to the ordering clinician or representative by the Radiologist Assistant, and communication documented in the PACS or zVision Dashboard. Electronically Signed   By: Kristine Garbe M.D.   On: 02/19/2018 19:30     Assessment/Plan Principal Problem:   Presumed SIADH/recurrent hyponatremia -This patient was admitted in January with pulmonary edema in the context of acute heart failure exacerbation/extensive volume overload with presenting sodium at that time of 119.  Weight at presentation was 185 lb with a dc weight of 175 lbs.  Patient with a net negative of >2 L. Discharge sodium 129. In Jan cards rec Lasix prn for weight gain -Patient was readmitted to the hospital on March sodium is averaging between  128 and 131. -He followed up with his cardiologist on 3/28 but no labs were obtained no adjustments were made in his furosemide dose (documented as daily dosing not prn) -He was readmitted on 4/03 due to complications from enlarging subdural hematoma with a presenting sodium of 122. -In further discussion with the patient he was able to tell me he is supposed to be maintaining a fluid restriction of  1800 cc/day but requests additional instructions on best how to maintain this -He also gives me conflicting information about his sodium intake noting he states he tries to restrict his sodium but then states he feels he needs more sodium and will go out to eat at [employer] just to eat a high sodium meal.  He states he last did this~ 1 week ago. -On exam patient does not appear overtly volume overloaded, his weight remains stable at 167 pounds -He currently is NPO in context of planned burr hole procedure later today; apparently was on an 1800 cc/24-hour fluid restriction at home but not completely adherent- he will need to be on a 1200 cc fluid restriction once diet resumed -Obtain osmolality **265 -We will continue furosemide for now but will change to Lasix 40 mg IV daily x2 doses then reevaluate urine osmolality is low and appears consistent with volume overload -Continue daily weights and strict I's/O -Follow labs; in January urine osmolality was 343 -She does complain of generalized lower extremity weakness and difficulty ambulating and although I suspect primary factor is his evolving subdural hematomas hyponatremia possibly contributing  Active Problems:   Chronic ischemic cardiomyopathy/systolic CHF (congestive heart failure), NYHA class 2  -Echocardiogram January 2019: EF 30-35% with hypokinesis of the inferior, mid apical anteroseptal, inferoseptal, apical anterior and apical myocardium, mild MR, RV systolic function with moderate TR but normal pulmonary artery pressure (32 mmHg) -Appears  compensated noting not hypoxemic and weight stable -Clarifying sodium as above -Obtain BNP -Continue digoxin, Entresto, metoprolol, furosemide (see change to IV dosing/increased dose) -Daily weights, strict I's/O    Hypertension -Current blood pressure 107/80 with utilization of above-stated medication    Chronic atrial fibrillation  -Currently rate controlled on AVN blocking agents -No longer on anticoagulation in the context of subdural hematoma -CHA2DS2-VASc Scoreis5    Coronary artery disease -Evaluated by [name] in the office on 3/28 -Known chronic occlusion of lady and RCA with extensive collaterals-was on medical management -Left and right heart catheterization January 2019 -Continue beta-blocker and statin -Not on aspirin secondary to recurrent subdural hematoma    Mild mitral valve regurgitation -Evaluated pressures during recent cardiac catheterization-noted to have only mild MR    SDH (subdural hematoma)  -Management per neurosurgery team with plan burr holes later today -On Decadron for cerebral edema and Keppra for seizure prophylaxis    **Additional lab, imaging and/or diagnostic evaluation at discretion of supervising physician  DVT prophylaxis: SCDs Code Status: Full-patient has legal guardian-?  Wife and during previous admissions has been reluctant to change patient to DNR status Family Communication: No family at bedside Disposition Plan: At discretion of the admitting team     Jt Brabec L. ANP-BC [facility] Pager [phone]   If 7PM-7AM, please contact night-coverage [email or web address] Password [facility]  02/20/2018, 9:31 AM

## 2018-02-20 NOTE — Progress Notes (Signed)
Subjective: The patient is alert and pleasant.  He has no complaints.  Objective: Vital signs in last 24 hours: Temp:  [97.6 F (36.4 C)-98 F (36.7 C)] 97.6 F (36.4 C) (04/12 0447) Pulse Rate:  [72-81] 73 (04/12 0447) Resp:  [16-18] 18 (04/12 0447) BP: (97-121)/(63-82) 97/67 (04/12 0447) SpO2:  [97 %-100 %] 100 % (04/12 0447) Estimated body mass index is 24.05 kg/m as calculated from the following:   Height as of 02/05/18: 5\' 10"  (1.778 m).   Weight as of 02/05/18: 76 kg (167 lb 9.6 oz).   Intake/Output from previous day: No intake/output data recorded. Intake/Output this shift: No intake/output data recorded.  Physical exam the patient is alert and oriented x1.  He has an expressive dysphasia.  He is moving all 4 extremities well.  His pupils are equal.  Lab Results: Recent Labs    02/19/18 1827  WBC 6.3  HGB 12.6*  HCT 36.5*  PLT 165   BMET Recent Labs    02/19/18 1827  NA 122*  K 4.3  CL 86*  CO2 27  GLUCOSE 104*  BUN 13  CREATININE 0.84  CALCIUM 8.6*    Studies/Results: Ct Head Wo Contrast  Result Date: 02/19/2018 CLINICAL DATA:  84 y/o  M; follow-up of subdural hemorrhage. EXAM: CT HEAD WITHOUT CONTRAST TECHNIQUE: Contiguous axial images were obtained from the base of the skull through the vertex without intravenous contrast. COMPARISON:  02/16/2018 CT head. FINDINGS: Brain: Increased size of mixed attenuation subdural hematoma over the left cerebral convexity, predominantly low in attenuation, with multiple high attenuation synechiae and areas of retracted clot. The collection measures up to 21 mm, previously 15 mm. Stable thin subdural hematoma thickening over the right cerebral convexity. No new area of acute intracranial hemorrhage. Mass effect associated with the left convexity subdural hematoma results in partial effacement of left lateral ventricle and 5 mm of left-to-right midline shift. Decreased in size and attenuation of right cerebellar hemisphere  chronic hematoma. Stable chronic microvascular ischemic changes and parenchymal volume loss of the brain. No downward herniation. Vascular: Calcific atherosclerosis of carotid siphons. Skull: Multiple bilateral burr holes.  No acute fracture. Sinuses/Orbits: No acute finding. Other: None. IMPRESSION: 1. Increased size of mixed attenuation subdural hematoma over left cerebral convexity and associated mass effect with 5 mm left-to-right midline shift, previously 3 mm. 2. Stable thin subdural hematoma/dural thickening over right cerebral convexity. 3. Decreased size of chronic hematoma in right cerebellar hemisphere. 4. Otherwise no no acute intracranial abnormality identified. These results will be called to the ordering clinician or representative by the Radiologist Assistant, and communication documented in the PACS or zVision Dashboard. Electronically Signed   By: Kristine Garbe M.D.   On: 02/19/2018 19:30    Assessment/Plan: Recurrent left subdural hematoma, hyponatremia: I will asked the hospitalist to see him regarding his hyponatremia.  I will plan a repeat left bur hole this afternoon.  I discussed this with the family when they arrive.  LOS: 0 days     Ophelia Charter 02/20/2018, 6:34 AM

## 2018-02-20 NOTE — Progress Notes (Signed)
I spoke with the patient's wife Ryan Savage and son Ryan Savage via the cell phone.  I updated him on the CT scan results, I.e. His left subdural hematoma is enlarging  With more mass effect.  We also discussed his low sodium.    We discussed the various treatment options.  I recommend a repeat left bur holes versus craniotomy for evacuation of the subdural hematoma.  I described that surgery to them.  We discussed the risks including the risks of anesthesia, infection, seizures, recurrent subdural hematoma, medical risk, etc.  I have answered all their questions.  The patient's wife has consented on behalf the patient.  We will plan to do this this afternoon.

## 2018-02-20 NOTE — Progress Notes (Signed)
Subjective: The patient is alert and pleasant.  He looks well.  He is in no apparent distress.  Objective: Vital signs in last 24 hours: Temp:  [97.5 F (36.4 C)-97.9 F (36.6 C)] 97.5 F (36.4 C) (04/12 1905) Pulse Rate:  [63-81] 63 (04/12 1214) Resp:  [16-18] 18 (04/12 1214) BP: (92-110)/(64-82) 92/66 (04/12 1214) SpO2:  [97 %-100 %] 97 % (04/12 1214) Weight:  [75.3 kg (166 lb 0.1 oz)] 75.3 kg (166 lb 0.1 oz) (04/12 1000) Estimated body mass index is 23.82 kg/m as calculated from the following:   Height as of 02/05/18: 5\' 10"  (1.778 m).   Weight as of this encounter: 75.3 kg (166 lb 0.1 oz).   Intake/Output from previous day: 04/11 0701 - 04/12 0700 In: -  Out: 300 [Urine:300] Intake/Output this shift: Total I/O In: -  Out: 75 [Blood:75]  Physical exam the patient is alert and pleasant.  His speech is normal.  He is moving all 4 extremities well.  Lab Results: Recent Labs    02/19/18 1827  WBC 6.3  HGB 12.6*  HCT 36.5*  PLT 165   BMET Recent Labs    02/19/18 1827 02/20/18 0437  NA 122* 123*  K 4.3 4.2  CL 86* 88*  CO2 27 24  GLUCOSE 104* 179*  BUN 13 13  CREATININE 0.84 0.81  CALCIUM 8.6* 8.7*    Studies/Results: Ct Head Wo Contrast  Result Date: 02/19/2018 CLINICAL DATA:  85 y/o  M; follow-up of subdural hemorrhage. EXAM: CT HEAD WITHOUT CONTRAST TECHNIQUE: Contiguous axial images were obtained from the base of the skull through the vertex without intravenous contrast. COMPARISON:  02/16/2018 CT head. FINDINGS: Brain: Increased size of mixed attenuation subdural hematoma over the left cerebral convexity, predominantly low in attenuation, with multiple high attenuation synechiae and areas of retracted clot. The collection measures up to 21 mm, previously 15 mm. Stable thin subdural hematoma thickening over the right cerebral convexity. No new area of acute intracranial hemorrhage. Mass effect associated with the left convexity subdural hematoma results in  partial effacement of left lateral ventricle and 5 mm of left-to-right midline shift. Decreased in size and attenuation of right cerebellar hemisphere chronic hematoma. Stable chronic microvascular ischemic changes and parenchymal volume loss of the brain. No downward herniation. Vascular: Calcific atherosclerosis of carotid siphons. Skull: Multiple bilateral burr holes.  No acute fracture. Sinuses/Orbits: No acute finding. Other: None. IMPRESSION: 1. Increased size of mixed attenuation subdural hematoma over left cerebral convexity and associated mass effect with 5 mm left-to-right midline shift, previously 3 mm. 2. Stable thin subdural hematoma/dural thickening over right cerebral convexity. 3. Decreased size of chronic hematoma in right cerebellar hemisphere. 4. Otherwise no no acute intracranial abnormality identified. These results will be called to the ordering clinician or representative by the Radiologist Assistant, and communication documented in the PACS or zVision Dashboard. Electronically Signed   By: Kristine Garbe M.D.   On: 02/19/2018 19:30    Assessment/Plan: The patient is doing well.  LOS: 0 days     Ryan Savage 02/20/2018, 7:10 PM

## 2018-02-20 NOTE — Progress Notes (Signed)
Patient transported to OR.

## 2018-02-21 ENCOUNTER — Inpatient Hospital Stay (HOSPITAL_COMMUNITY): Payer: Medicare Other

## 2018-02-21 DIAGNOSIS — I10 Essential (primary) hypertension: Secondary | ICD-10-CM

## 2018-02-21 LAB — COMPREHENSIVE METABOLIC PANEL
ALT: 10 U/L — ABNORMAL LOW (ref 17–63)
AST: 18 U/L (ref 15–41)
Albumin: 2.9 g/dL — ABNORMAL LOW (ref 3.5–5.0)
Alkaline Phosphatase: 49 U/L (ref 38–126)
Anion gap: 10 (ref 5–15)
BUN: 18 mg/dL (ref 6–20)
CO2: 23 mmol/L (ref 22–32)
Calcium: 8.2 mg/dL — ABNORMAL LOW (ref 8.9–10.3)
Chloride: 89 mmol/L — ABNORMAL LOW (ref 101–111)
Creatinine, Ser: 0.93 mg/dL (ref 0.61–1.24)
GFR calc Af Amer: >60 mL/min (ref >60)
GFR calc non Af Amer: >60 mL/min (ref >60)
Glucose, Bld: 205 mg/dL — ABNORMAL HIGH (ref 65–99)
Potassium: 3.8 mmol/L (ref 3.5–5.1)
Sodium: 122 mmol/L — ABNORMAL LOW (ref 135–145)
Total Bilirubin: 0.5 mg/dL (ref 0.3–1.2)
Total Protein: 5.4 g/dL — ABNORMAL LOW (ref 6.5–8.1)

## 2018-02-21 LAB — MRSA PCR SCREENING: MRSA by PCR: NEGATIVE

## 2018-02-21 MED ORDER — PANTOPRAZOLE SODIUM 40 MG PO TBEC
40.0000 mg | DELAYED_RELEASE_TABLET | Freq: Every day | ORAL | Status: DC
  Administered 2018-02-21 – 2018-02-23 (×3): 40 mg via ORAL
  Filled 2018-02-21 (×3): qty 1

## 2018-02-21 MED ORDER — DEXAMETHASONE SODIUM PHOSPHATE 4 MG/ML IJ SOLN
4.0000 mg | Freq: Four times a day (QID) | INTRAMUSCULAR | Status: AC
  Administered 2018-02-21: 4 mg via INTRAVENOUS
  Filled 2018-02-21: qty 1

## 2018-02-21 MED ORDER — LEVETIRACETAM ER 500 MG PO TB24
500.0000 mg | ORAL_TABLET | Freq: Two times a day (BID) | ORAL | Status: DC
  Filled 2018-02-21: qty 1

## 2018-02-21 MED ORDER — SODIUM CHLORIDE 0.9 % IV SOLN
INTRAVENOUS | Status: DC
  Administered 2018-02-21: 11:00:00 via INTRAVENOUS

## 2018-02-21 MED ORDER — LEVETIRACETAM 500 MG PO TABS
500.0000 mg | ORAL_TABLET | Freq: Two times a day (BID) | ORAL | Status: DC
  Administered 2018-02-21 – 2018-02-24 (×6): 500 mg via ORAL
  Filled 2018-02-21 (×6): qty 1

## 2018-02-21 MED ORDER — LEVETIRACETAM ER 500 MG PO TB24: 500.0000 mg | ORAL_TABLET | Freq: Two times a day (BID) | ORAL | Status: DC

## 2018-02-21 MED ORDER — DEXAMETHASONE SODIUM PHOSPHATE 4 MG/ML IJ SOLN
4.0000 mg | Freq: Three times a day (TID) | INTRAMUSCULAR | Status: DC
  Administered 2018-02-21 – 2018-02-24 (×9): 4 mg via INTRAVENOUS
  Filled 2018-02-21 (×9): qty 1

## 2018-02-21 NOTE — Plan of Care (Signed)
Pt alert, pleasant and tolerating an increase in activity.

## 2018-02-21 NOTE — Progress Notes (Signed)
Pt seen and examined. No issues overnight. Doing well, reports improvement in speech. Minimal HA.  EXAM: Temp:  [97 F (36.1 C)-98.1 F (36.7 C)] 97.5 F (36.4 C) (04/13 0800) Pulse Rate:  [47-253] 58 (04/13 0927) Resp:  [12-29] 14 (04/13 0900) BP: (78-141)/(45-95) 82/46 (04/13 0927) SpO2:  [82 %-100 %] 98 % (04/13 0900) Weight:  [75.3 kg (166 lb 0.1 oz)-79 kg (174 lb 2.6 oz)] 79 kg (174 lb 2.6 oz) (04/13 0500) Intake/Output      04/12 0701 - 04/13 0700 04/13 0701 - 04/14 0700   P.O. 360    I.V. (mL/kg) 1050 (13.3)    IV Piggyback 300    Total Intake(mL/kg) 1710 (21.6)    Urine (mL/kg/hr) 3250 (1.7)    Blood 75    Total Output 3325    Net -1615          Awake, alert Speech fluent, naming and repetition intact MAE well, mild right drift Dressing c/d/i  LABS: Lab Results  Component Value Date   CREATININE 0.93 02/21/2018   BUN 18 02/21/2018   NA 122 (L) 02/21/2018   K 3.8 02/21/2018   CL 89 (L) 02/21/2018   CO2 23 02/21/2018   Lab Results  Component Value Date   WBC 6.3 02/19/2018   HGB 12.6 (L) 02/19/2018   HCT 36.5 (L) 02/19/2018   MCV 91.3 02/19/2018   PLT 165 02/19/2018    IMAGING: Ct Head this am reviewed, demonstrates evacuation of left chronic SDH, moderate left frontal pneumocephalus. Improvement in left convexity local mass effect, largely unchanged L->R MLS. No HCP  IMPRESSION: - 84 y.o. male POD#1 s/p left bur hole evac of chronic SDH  PLAN: - Transfer to progressive care - OOB today - May consider d/c tomorrow

## 2018-02-21 NOTE — Anesthesia Postprocedure Evaluation (Signed)
Anesthesia Post Note  Patient: Ryan Savage  Procedure(s) Performed: BURR HOLES for subdural hematoma (Left )     Patient location during evaluation: PACU Anesthesia Type: General Level of consciousness: awake and alert Pain management: pain level controlled Vital Signs Assessment: post-procedure vital signs reviewed and stable Respiratory status: spontaneous breathing, nonlabored ventilation, respiratory function stable and patient connected to nasal cannula oxygen Cardiovascular status: blood pressure returned to baseline and stable Postop Assessment: no apparent nausea or vomiting Anesthetic complications: no    Last Vitals:  Vitals:   02/21/18 1300 02/21/18 1400  BP: (!) 102/57 (!) 93/52  Pulse: 82 95  Resp: (!) 22 (!) 22  Temp:    SpO2: 99% 99%    Last Pain:  Vitals:   02/21/18 0800  TempSrc: Oral  PainSc: 0-No pain                 Charletha Dalpe,W. EDMOND

## 2018-02-21 NOTE — Progress Notes (Signed)
PROGRESS NOTE    Ryan Savage  [id number]:[id number] DOB: 1929/09/30 DOA: 02/19/2018 PCP: Lajean Manes, MD      Brief Narrative:  Mr. Nepomuceno is an 84 y.o. M with CHF EF 30-35%, chronic hyponatremia, cAF not on AC due to recent SDH, HTN, and coronary disease who presents with acute decreased mental status, found to have woresening of his SDH, taken to OR by Dr. Arnoldo Morale for burr-hole craniotomy and evacuation of SDH.     Assessment & Plan:  Subdural hematoma Care per Neurosurgery  Hyponatremia Got 1.7L in yesterday, sodium worse today.   -Minimize IV fluids -Start fluid restriction -Daily sodium check  Chronic systolic CHF Coronary artery disease Hypertension Hypotensive today. Euvolemic on exam. Echo January EF 30%.  Ischemic (LHC in January with 2 vessel [name], medical therapy alone).  New onset. -Continue statin -Continue digoxin -Hold metoprolol, furosemide, and Entresto until pressures improve, stop fluids when pressures improve  Atrial fibrillation, chronic Rate controlled at present.  Not AC candidate.  No previous stroke.  CHADs2Vasc only 4.   -Hold metoprolol -Continue digoxin      Code Status: FULL Family Communication: Wife at bedside MDM and disposition Plan: The below labs and imaging reports were reviewed and summarized above.  The patient was admitted for worsening SDH,underwent evacuation yesterday.  Appears to be doing well.  Somewhat hypotensive toay, but asymptomatic.  We are consulted for sodium evaluation and management.        Procedures:  Left burr holes for evacuation of subdural hematoma      Subjective: Feels well.  No dizziness, confusion, seizures.  Mild leg weakness, wants to walk, is planning to transfer to chair with RN.  No chest pain, palpitations, dyspnea, leg swelling, orthopnea, cough.  No fever, sputum, dysuria.  Objective: Vitals:   02/21/18 0500 02/21/18 0600 02/21/18 0700 02/21/18 0705  BP: (!) 89/55 (!) 86/52 (!) 78/45  (!) 80/46  Pulse: 82 65 88 68  Resp: 20 16 13 13   Temp:      TempSrc:      SpO2: 95% 98% 97% 97%  Weight: 79 kg (174 lb 2.6 oz)     Height:        Intake/Output Summary (Last 24 hours) at 02/21/2018 0756 Last data filed at 02/21/2018 0700 Gross per 24 hour  Intake 1710 ml  Output 3325 ml  Net -1615 ml   Filed Weights   02/20/18 1000 02/20/18 2210 02/21/18 0500  Weight: 75.3 kg (166 lb 0.1 oz) 76 kg (167 lb 8.8 oz) 79 kg (174 lb 2.6 oz)    Examination: General appearance: Elderly adult male, alert and in no acute distress.  Sitting on side of bed, interactive and conversational HEENT: Glasses on.  Anicteric, conjunctiva pink, lids and lashes normal. No nasal deformity, discharge, epistaxis.  Lips moist, OP tacky dry, no oral lesions, dentition good, hearing normal.   Skin: Warm and dry.  No jaundice.  No suspicious rashes or lesions. Cardiac: RRR, nl S1-S2.  Capillary refill is brisk.  JVP normal.  No LE edema.  Radial and DP pulses 2+ and symmetric. Respiratory: Normal respiratory rate and rhythm.  CTAB without rales or wheezes. Abdomen: No distension. MSK: No deformities or effusions in large joints of upper or lower extremities bilaterally. Neuro: Awake and alert.  EOMI, moves all extremities. Speech fluent.    Cranial nerves 3-12 intact.  Strength 5/5 in upper and lower extremities Psych: Sensorium intact and responding to questions, attention normal. Oriented to person, place,  time and situation.  Subtle disorientation (about "April fools day is on April 13th a Friday").  No tremor. Affect full range.  Judgment and insight appear normal.    Data Reviewed: I have personally reviewed following labs and imaging studies:  CBC: Recent Labs  Lab 02/19/18 1827  WBC 6.3  HGB 12.6*  HCT 36.5*  MCV 91.3  PLT 740   Basic Metabolic Panel: Recent Labs  Lab 02/19/18 1827 02/20/18 0437 02/21/18 0233  NA 122* 123* 122*  K 4.3 4.2 3.8  CL 86* 88* 89*  CO2 27 24 23   GLUCOSE  104* 179* 205*  BUN 13 13 18   CREATININE 0.84 0.81 0.93  CALCIUM 8.6* 8.7* 8.2*   GFR: Estimated Creatinine Clearance: 58.9 mL/min (by C-G formula based on SCr of 0.93 mg/dL). Liver Function Tests: Recent Labs  Lab 02/21/18 0233  AST 18  ALT 10*  ALKPHOS 49  BILITOT 0.5  PROT 5.4*  ALBUMIN 2.9*   No results for input(s): LIPASE, AMYLASE in the last 168 hours. No results for input(s): AMMONIA in the last 168 hours. Coagulation Profile: No results for input(s): INR, PROTIME in the last 168 hours. Cardiac Enzymes: No results for input(s): CKTOTAL, CKMB, CKMBINDEX, TROPONINI in the last 168 hours. BNP (last 3 results) No results for input(s): PROBNP in the last 8760 hours. HbA1C: No results for input(s): HGBA1C in the last 72 hours. CBG: No results for input(s): GLUCAP in the last 168 hours. Lipid Profile: No results for input(s): CHOL, HDL, LDLCALC, TRIG, CHOLHDL, LDLDIRECT in the last 72 hours. Thyroid Function Tests: Recent Labs    02/20/18 1445 02/20/18 1940  TSH 0.224*  --   FREET4  --  1.29*   Anemia Panel: No results for input(s): VITAMINB12, FOLATE, FERRITIN, TIBC, IRON, RETICCTPCT in the last 72 hours. Urine analysis:    Component Value Date/Time   COLORURINE YELLOW 01/23/2018 1310   APPEARANCEUR CLEAR 01/23/2018 1310   LABSPEC 1.009 01/23/2018 1310   PHURINE 7.0 01/23/2018 1310   GLUCOSEU NEGATIVE 01/23/2018 1310   HGBUR SMALL (A) 01/23/2018 1310   BILIRUBINUR NEGATIVE 01/23/2018 1310   KETONESUR NEGATIVE 01/23/2018 1310   PROTEINUR NEGATIVE 01/23/2018 1310   NITRITE NEGATIVE 01/23/2018 1310   LEUKOCYTESUR NEGATIVE 01/23/2018 1310   Sepsis Labs: @LABRCNTIP (procalcitonin:4,lacticacidven:4)  ) Recent Results (from the past 240 hour(s))  Surgical PCR screen     Status: None   Collection Time: 02/20/18  1:06 PM  Result Value Ref Range Status   MRSA, PCR NEGATIVE NEGATIVE Final   Staphylococcus aureus NEGATIVE NEGATIVE Final    Comment: (NOTE) The  Xpert SA Assay (FDA approved for NASAL specimens in patients 55 years of age and older), is one component of a comprehensive surveillance program. It is not intended to diagnose infection nor to guide or monitor treatment. Performed at [facility], [place] [address], [place], [place] [address]   MRSA PCR Screening     Status: None   Collection Time: 02/20/18 10:16 PM  Result Value Ref Range Status   MRSA by PCR NEGATIVE NEGATIVE Final    Comment:        The GeneXpert MRSA Assay (FDA approved for NASAL specimens only), is one component of a comprehensive MRSA colonization surveillance program. It is not intended to diagnose MRSA infection nor to guide or monitor treatment for MRSA infections. Performed at [facility], [place] [address], [place], [place] [address]          Radiology Studies:  Ct Head Wo Contrast  Result Date: 02/21/2018 CLINICAL DATA:  Follow-up subdural evacuation. EXAM: CT HEAD WITHOUT CONTRAST TECHNIQUE: Contiguous axial images were obtained from the base of the skull through the vertex without intravenous contrast. COMPARISON:  CT HEAD February 19, 2018 and MRI of the head December 08, 2017 in [place] January 24, 2018 FINDINGS: BRAIN: Interval evacuation of LEFT mixed density subdural hematoma with residual 13 mm collection. New LEFT frontal extra-axial pneumocephalus with mild mass effect on the subjacent frontal lobe. 6 mm LEFT-to-RIGHT midline shift. Suspected septations/membrane formation LEFT subdural collection. No RIGHT ventricular entrapment. No intraparenchymal hemorrhage. 12 mm hypodensity RIGHT cerebellar corresponding to recent hematoma. Stable RIGHT frontal extra-axial fluid collection measuring to 10 mm. No parenchymal brain volume loss for age. No acute large vascular territory infarcts. Basal cisterns are patent. VASCULAR: Moderate calcific atherosclerosis of the carotid siphons. Bilateral burr holes, some which are new on the LEFT associated with  soft tissue swelling, skin staples. SINUSES/ORBITS: The mastoid air-cells and included paranasal sinuses are well-aerated.The included ocular globes and orbital contents are non-suspicious. OTHER: None. IMPRESSION: 1. Interval evacuation of LEFT subdural hematoma with 13 mm residual component, potential tension pneumocephalus. 6 mm LEFT-to-RIGHT midline shift without ventricular entrapment. 2. Stable RIGHT frontal 10 mm mixed density subdural collection. 3. Early RIGHT cerebellar encephalomalacia. Electronically Signed   By: Elon Alas M.D.   On: 02/21/2018 05:25   Ct Head Wo Contrast  Result Date: 02/19/2018 CLINICAL DATA:  84 y/o  M; follow-up of subdural hemorrhage. EXAM: CT HEAD WITHOUT CONTRAST TECHNIQUE: Contiguous axial images were obtained from the base of the skull through the vertex without intravenous contrast. COMPARISON:  02/16/2018 CT head. FINDINGS: Brain: Increased size of mixed attenuation subdural hematoma over the left cerebral convexity, predominantly low in attenuation, with multiple high attenuation synechiae and areas of retracted clot. The collection measures up to 21 mm, previously 15 mm. Stable thin subdural hematoma thickening over the right cerebral convexity. No new area of acute intracranial hemorrhage. Mass effect associated with the left convexity subdural hematoma results in partial effacement of left lateral ventricle and 5 mm of left-to-right midline shift. Decreased in size and attenuation of right cerebellar hemisphere chronic hematoma. Stable chronic microvascular ischemic changes and parenchymal volume loss of the brain. No downward herniation. Vascular: Calcific atherosclerosis of carotid siphons. Skull: Multiple bilateral burr holes.  No acute fracture. Sinuses/Orbits: No acute finding. Other: None. IMPRESSION: 1. Increased size of mixed attenuation subdural hematoma over left cerebral convexity and associated mass effect with 5 mm left-to-right midline shift,  previously 3 mm. 2. Stable thin subdural hematoma/dural thickening over right cerebral convexity. 3. Decreased size of chronic hematoma in right cerebellar hemisphere. 4. Otherwise no no acute intracranial abnormality identified. These results will be called to the ordering clinician or representative by the Radiologist Assistant, and communication documented in the PACS or zVision Dashboard. Electronically Signed   By: Kristine Garbe M.D.   On: 02/19/2018 19:30        Scheduled Meds: . atorvastatin  20 mg Oral QPM  . Chlorhexidine Gluconate Cloth  6 each Topical Q0600  . dexamethasone  4 mg Intravenous Q6H   Followed by  . [START ON 02/22/2018] dexamethasone  4 mg Intravenous Q8H  . digoxin  0.125 mg Oral Daily  . docusate sodium  100 mg Oral BID  . feeding supplement (ENSURE ENLIVE)  237 mL Oral BID BM  . finasteride  5 mg Oral Daily  . fluticasone  1 spray  Each Nare Daily  . furosemide  40 mg Intravenous Daily  . metoprolol succinate  25 mg Oral BID  . mupirocin ointment  1 application Nasal BID  . pantoprazole (PROTONIX) IV  40 mg Intravenous QHS  . sacubitril-valsartan  1 tablet Oral BID   Continuous Infusions: . sodium chloride 10 mL/hr at 02/20/18 1701  . 0.9 % NaCl with KCl 20 mEq / L 50 mL/hr at 02/21/18 0000  . levETIRAcetam Stopped (02/21/18 0015)     LOS: 1 day    Time spent: 25 minutes    Edwin Dada, MD [facility] 02/21/2018, 1530    Pager [phone] --- please page though AMION:  [email or web address] Password [email or web address] If 7PM-7AM, please contact night-coverage

## 2018-02-22 ENCOUNTER — Encounter (HOSPITAL_COMMUNITY): Payer: Self-pay | Admitting: Neurosurgery

## 2018-02-22 LAB — BASIC METABOLIC PANEL
Anion gap: 7 (ref 5–15)
BUN: 17 mg/dL (ref 6–20)
CO2: 25 mmol/L (ref 22–32)
Calcium: 8.2 mg/dL — ABNORMAL LOW (ref 8.9–10.3)
Chloride: 93 mmol/L — ABNORMAL LOW (ref 101–111)
Creatinine, Ser: 0.73 mg/dL (ref 0.61–1.24)
GFR calc Af Amer: >60 mL/min (ref >60)
GFR calc non Af Amer: >60 mL/min (ref >60)
Glucose, Bld: 171 mg/dL — ABNORMAL HIGH (ref 65–99)
Potassium: 4.2 mmol/L (ref 3.5–5.1)
Sodium: 125 mmol/L — ABNORMAL LOW (ref 135–145)

## 2018-02-22 MED ORDER — METOPROLOL SUCCINATE ER 25 MG PO TB24
25.0000 mg | ORAL_TABLET | Freq: Every day | ORAL | Status: DC
  Administered 2018-02-22 – 2018-02-24 (×3): 25 mg via ORAL
  Filled 2018-02-22 (×3): qty 1

## 2018-02-22 NOTE — Plan of Care (Signed)
Pt does not complain of a headache at this time

## 2018-02-22 NOTE — Progress Notes (Signed)
Inpatient Rehabilitation  Per PT, OT request, patient was screened by Gunnar Fusi for appropriateness for an Inpatient Acute Rehab consult.  At this time we are recommending an Inpatient Rehab consult.  Please order if you are agreeable.   Carmelia Roller., CCC/SLP Admission Coordinator  [place]  Cell [phone]

## 2018-02-22 NOTE — Progress Notes (Signed)
PROGRESS NOTE    Ryan Savage  CBS:[id number] DOB: 1929-05-03 DOA: 02/19/2018 PCP: Lajean Manes, MD      Brief Narrative:  Ryan Savage is an 84 y.o. M with CHF EF 30-35%, chronic hyponatremia, cAF not on AC due to recent SDH, HTN, and coronary disease who presents with acute decreased mental status, found to have woresening of his SDH, taken to OR by Dr. Arnoldo Morale for burr-hole craniotomy and evacuation of SDH.     Assessment & Plan:  Subdural hematoma Care per Neurosurgery  Hyponatremia Only 1300 intake yesterday, Na improved to 125.  His baseline is only the 130s.    -Stop IV fliuds -Continue fluid restriction -Daily weights -As he is asymptomatic and close to his baseline Na, discharge today could be considered with strict fluid restriction, daily weights, repeat Na this week, and close Cardiology fu   Chronic systolic CHF Coronary artery disease Hypertension BP imrpoved today.  Euvolemic.    Echo January EF 30%.  Ischemic (LHC in January with 2 vessel [name], medical therapy alone).  New onset. -Continue statin, digoxin -Restart metoprolol today -If discharge today, restart furosemide tomorrow, recommend Cardiology follow up this week (can call me to arrange this, or Cardiology before discharge) -Hold Entresto, to be restarted by Cardiology after discharge  Atrial fibrillation, chronic Rate controlled at present.  Not AC candidate.  No previous stroke.  CHADs2Vasc only 4.   -Restart metop -Continue dig       Code Status: FULL Family Communication: Wife at bedside MDM and disposition Plan: The below labs and imaging were reviewed.  Summarized above.  Admitted for leg weakness, confusion in setting of recurrent SDH.  Evacuated by NSG two days ago, doing well since.  We are consulted for sodium mgmt in the context of acute on chronic asymptomatic hyponatremia.       Procedures:  Left burr holes for evacuation of subdural hematoma      Subjective: Feels  well.  Tired, sleeping, rouses easyil. Globally weak, but no leg numbness or weakness as he had at presentation.  No focal weakness.  Mentation good.  Oriented.  No seizures, tremors.     Objective: Vitals:   02/22/18 0500 02/22/18 0600 02/22/18 0700 02/22/18 0800  BP: 105/71 (!) 98/54 (!) 107/50 108/88  Pulse: 63 73 (!) 51 (!) 56  Resp: 17 16 15 19   Temp:    (!) 97.5 F (36.4 C)  TempSrc:    Oral  SpO2: 98% 95% 96% 94%  Weight: 78.1 kg (172 lb 2.9 oz)     Height:        Intake/Output Summary (Last 24 hours) at 02/22/2018 0904 Last data filed at 02/22/2018 0800 Gross per 24 hour  Intake 1207.5 ml  Output 1700 ml  Net -492.5 ml   Filed Weights   02/20/18 2210 02/21/18 0500 02/22/18 0500  Weight: 76 kg (167 lb 8.8 oz) 79 kg (174 lb 2.6 oz) 78.1 kg (172 lb 2.9 oz)    Examination: General appearance: Elderly male, lying in bed, sleeping, easily rousable, oriented, interactive, NAD. HEENT: Glasses on.  Anicteric, conjunctiva pink, lids and lashes normal. No nasal deformity, discharge, epistaxis.  Lips moist, dentition good. Hearing good.  OP dry. Skin: Warm and dry.  No jaundice.  No suspicious rashes or lesions. Cardiac: RRR, soft systolic mrumur, no LE edema, no JVD. Respiratory: Resp effort normal, no rales or wheezes. Abdomen: No TTP, abdomen soft, no distension, no HSM. MSK: No deformities or effusions in large joints  of upper or lower extremities bilaterally. Neuro: Awake and alert.  Cranial nerves symmetric.  Strength 5-5 in upper and lower extremities bilaterally. Psych: Deatra James dto person, place and time.  Attention normal.  Affect normal, judgment and insight normal.    Data Reviewed: I have personally reviewed following labs and imaging studies:  CBC: Recent Labs  Lab 02/19/18 1827  WBC 6.3  HGB 12.6*  HCT 36.5*  MCV 91.3  PLT 782   Basic Metabolic Panel: Recent Labs  Lab 02/19/18 1827 02/20/18 0437 02/21/18 0233 02/22/18 0433  NA 122* 123* 122* 125*    K 4.3 4.2 3.8 4.2  CL 86* 88* 89* 93*  CO2 27 24 23 25   GLUCOSE 104* 179* 205* 171*  BUN 13 13 18 17   CREATININE 0.84 0.81 0.93 0.73  CALCIUM 8.6* 8.7* 8.2* 8.2*   GFR: Estimated Creatinine Clearance: 68.4 mL/min (by C-G formula based on SCr of 0.73 mg/dL). Liver Function Tests: Recent Labs  Lab 02/21/18 0233  AST 18  ALT 10*  ALKPHOS 49  BILITOT 0.5  PROT 5.4*  ALBUMIN 2.9*   No results for input(s): LIPASE, AMYLASE in the last 168 hours. No results for input(s): AMMONIA in the last 168 hours. Coagulation Profile: No results for input(s): INR, PROTIME in the last 168 hours. Cardiac Enzymes: No results for input(s): CKTOTAL, CKMB, CKMBINDEX, TROPONINI in the last 168 hours. BNP (last 3 results) No results for input(s): PROBNP in the last 8760 hours. HbA1C: No results for input(s): HGBA1C in the last 72 hours. CBG: No results for input(s): GLUCAP in the last 168 hours. Lipid Profile: No results for input(s): CHOL, HDL, LDLCALC, TRIG, CHOLHDL, LDLDIRECT in the last 72 hours. Thyroid Function Tests: Recent Labs    02/20/18 1445 02/20/18 1940  TSH 0.224*  --   FREET4  --  1.29*   Anemia Panel: No results for input(s): VITAMINB12, FOLATE, FERRITIN, TIBC, IRON, RETICCTPCT in the last 72 hours. Urine analysis:    Component Value Date/Time   COLORURINE YELLOW 01/23/2018 1310   APPEARANCEUR CLEAR 01/23/2018 1310   LABSPEC 1.009 01/23/2018 1310   PHURINE 7.0 01/23/2018 1310   GLUCOSEU NEGATIVE 01/23/2018 1310   HGBUR SMALL (A) 01/23/2018 1310   BILIRUBINUR NEGATIVE 01/23/2018 1310   KETONESUR NEGATIVE 01/23/2018 1310   PROTEINUR NEGATIVE 01/23/2018 1310   NITRITE NEGATIVE 01/23/2018 1310   LEUKOCYTESUR NEGATIVE 01/23/2018 1310   Sepsis Labs: @LABRCNTIP (procalcitonin:4,lacticacidven:4)  ) Recent Results (from the past 240 hour(s))  Surgical PCR screen     Status: None   Collection Time: 02/20/18  1:06 PM  Result Value Ref Range Status   MRSA, PCR NEGATIVE  NEGATIVE Final   Staphylococcus aureus NEGATIVE NEGATIVE Final    Comment: (NOTE) The Xpert SA Assay (FDA approved for NASAL specimens in patients 11 years of age and older), is one component of a comprehensive surveillance program. It is not intended to diagnose infection nor to guide or monitor treatment. Performed at [facility], [place] [address], [place], [place] [address]   MRSA PCR Screening     Status: None   Collection Time: 02/20/18 10:16 PM  Result Value Ref Range Status   MRSA by PCR NEGATIVE NEGATIVE Final    Comment:        The GeneXpert MRSA Assay (FDA approved for NASAL specimens only), is one component of a comprehensive MRSA colonization surveillance program. It is not intended to diagnose MRSA infection nor to guide or monitor treatment for MRSA infections. Performed  at [facility], [place] [address] [place], [place] [address]          Radiology Studies: Ct Head Wo Contrast  Result Date: 02/21/2018 CLINICAL DATA:  Follow-up subdural evacuation. EXAM: CT HEAD WITHOUT CONTRAST TECHNIQUE: Contiguous axial images were obtained from the base of the skull through the vertex without intravenous contrast. COMPARISON:  CT HEAD February 19, 2018 and MRI of the head December 08, 2017 in [place] January 24, 2018 FINDINGS: BRAIN: Interval evacuation of LEFT mixed density subdural hematoma with residual 13 mm collection. New LEFT frontal extra-axial pneumocephalus with mild mass effect on the subjacent frontal lobe. 6 mm LEFT-to-RIGHT midline shift. Suspected septations/membrane formation LEFT subdural collection. No RIGHT ventricular entrapment. No intraparenchymal hemorrhage. 12 mm hypodensity RIGHT cerebellar corresponding to recent hematoma. Stable RIGHT frontal extra-axial fluid collection measuring to 10 mm. No parenchymal brain volume loss for age. No acute large vascular territory infarcts. Basal cisterns are patent. VASCULAR: Moderate calcific atherosclerosis of  the carotid siphons. Bilateral burr holes, some which are new on the LEFT associated with soft tissue swelling, skin staples. SINUSES/ORBITS: The mastoid air-cells and included paranasal sinuses are well-aerated.The included ocular globes and orbital contents are non-suspicious. OTHER: None. IMPRESSION: 1. Interval evacuation of LEFT subdural hematoma with 13 mm residual component, potential tension pneumocephalus. 6 mm LEFT-to-RIGHT midline shift without ventricular entrapment. 2. Stable RIGHT frontal 10 mm mixed density subdural collection. 3. Early RIGHT cerebellar encephalomalacia. Electronically Signed   By: Elon Alas M.D.   On: 02/21/2018 05:25        Scheduled Meds: . atorvastatin  20 mg Oral QPM  . Chlorhexidine Gluconate Cloth  6 each Topical Q0600  . dexamethasone  4 mg Intravenous Q8H  . digoxin  0.125 mg Oral Daily  . docusate sodium  100 mg Oral BID  . feeding supplement (ENSURE ENLIVE)  237 mL Oral BID BM  . finasteride  5 mg Oral Daily  . fluticasone  1 spray Each Nare Daily  . levETIRAcetam  500 mg Oral BID  . mupirocin ointment  1 application Nasal BID  . pantoprazole  40 mg Oral QHS   Continuous Infusions:    LOS: 2 days    Time spent: 25 minutes    Edwin Dada, MD [facility] 02/22/2018, 6294    Pager [phone] --- please page though AMION:  [email or web address] Password [email or web address] If 7PM-7AM, please contact night-coverage

## 2018-02-22 NOTE — Evaluation (Signed)
Physical Therapy Evaluation Patient Details Name: Ryan Savage MRN: [id number] DOB: 1929/07/26 Today's Date: 02/22/2018   History of Present Illness  Ryan Savage is a 84 y.o. male with medical history significant for ischemic cardiomyopathy with EF 30-35%, mild mitral valve prolapse confirmed by cardiac catheterization, chronic atrial fibrillation no longer on anticoagulation secondary to subdural hematoma status post burr holes on 3/17, hypertension, [name] tonically occluded LAD and RCA with extensive collaterals, dyslipidemia, BPH, [name], GERD and SIADH.  Patient was admitted to the hospital on 4/11 for dysarthria and weakness. Found to have worsening SDH, now s/p another Burr-hole procedure on 4/12  Clinical Impression   Patient is s/p above surgery resulting in functional limitations due to the deficits listed below (see PT Problem List). Very motivated to get getter and steadier with walking and mobility; Excellent participation, hallway ambulation; noted incr fatigue after hallway ambulation; Rec CIR for more intensive therapies to maximize independence and safety with mobility prior to dc home; Patient will benefit from skilled PT to increase their independence and safety with mobility to allow discharge to the venue listed below.       Follow Up Recommendations CIR    Equipment Recommendations  None recommended by PT    Recommendations for Other Services Rehab consult     Precautions / Restrictions Precautions Precautions: Fall Restrictions Weight Bearing Restrictions: No      Mobility  Bed Mobility Overal bed mobility: Needs Assistance Bed Mobility: Supine to Sit     Supine to sit: Min guard;HOB elevated     General bed mobility comments: Min Guard A for safety   Transfers Overall transfer level: Needs assistance Equipment used: Rolling walker (2 wheeled) Transfers: Sit to/from Stand Sit to Stand: Min assist         General transfer comment: Min A to steady  and stability RW. VCs for hand placement  Ambulation/Gait Ambulation/Gait assistance: Min guard;Min assist Ambulation Distance (Feet): 100 Feet Assistive device: Rolling walker (2 wheeled) Gait Pattern/deviations: Step-through pattern;Trunk flexed     General Gait Details: Noting shorter steps, and difficulty attending to 2 tasks at once, had to stop walking to talk/answer questions; very motivated to improve and particiaptes well; showing fatigue at the end of the walk, with difficulty standing at sink after hallway ambulation  Stairs            Wheelchair Mobility    Modified Rankin (Stroke Patients Only) Modified Rankin (Stroke Patients Only) Pre-Morbid Rankin Score: Moderate disability Modified Rankin: Moderately severe disability     Balance Overall balance assessment: Needs assistance Sitting-balance support: Feet supported;No upper extremity supported Sitting balance-Leahy Scale: Good Sitting balance - Comments: donning socks   Standing balance support: During functional activity;Bilateral upper extremity supported Standing balance-Leahy Scale: Poor Standing balance comment: Reliant on UE support and physical A                             Pertinent Vitals/Pain Pain Assessment: 0-10 Pain Score: 4  Pain Location: Head Pain Descriptors / Indicators: Discomfort;Constant Pain Intervention(s): Monitored during session    Home Living Family/patient expects to be discharged to:: Private residence Living Arrangements: Spouse/significant other Available Help at Discharge: Family;Available 24 hours/day Type of Home: House Home Access: Stairs to enter Entrance Stairs-Rails: Right Entrance Stairs-Number of Steps: 3 + 3 Home Layout: One level Home Equipment: Walker - standard;Bedside commode;Grab bars - tub/shower;Shower seat      Prior Function Level of Independence: Needs  assistance   Gait / Transfers Assistance Needed: Since recent admission, pt was  been walking with and without AD.  ADL's / Homemaking Assistance Needed: Wife having to assist with LB ADLs due to weakness  Comments: Pt reporting recent falls at home     Hand Dominance   Dominant Hand: Right    Extremity/Trunk Assessment   Upper Extremity Assessment Upper Extremity Assessment: Defer to OT evaluation RUE Deficits / Details: WFL strength. Noting that RUE weaker than LUE RUE Coordination: decreased fine motor;decreased gross motor    Lower Extremity Assessment Lower Extremity Assessment: Generalized weakness(grossly symmetrical)    Cervical / Trunk Assessment Cervical / Trunk Assessment: Normal  Communication      Cognition Arousal/Alertness: Awake/alert Behavior During Therapy: WFL for tasks assessed/performed Overall Cognitive Status: Impaired/Different from baseline Area of Impairment: Problem solving;Following commands                       Following Commands: Follows one step commands with increased time;Follows multi-step commands inconsistently;Follows multi-step commands with increased time     Problem Solving: Slow processing;Requires verbal cues;Requires tactile cues General Comments: Pt with decreased problem solving and required increased time and cues during ADLs and mobility. Pt unable to perform divided attention to answer questions and complete mobility. Highly motivated to increased independence and safety as well as participate in therapy      General Comments      Exercises     Assessment/Plan    PT Assessment Patient needs continued PT services  PT Problem List Decreased strength;Decreased activity tolerance;Decreased balance;Decreased mobility;Decreased knowledge of use of DME;Decreased knowledge of precautions;Decreased cognition       PT Treatment Interventions DME instruction;Gait training;Stair training;Functional mobility training;Therapeutic activities;Therapeutic exercise;Balance training;Patient/family  education;Cognitive remediation;Neuromuscular re-education    PT Goals (Current goals can be found in the Care Plan section)  Acute Rehab PT Goals Patient Stated Goal: to go home and decrease fall risk PT Goal Formulation: With patient Time For Goal Achievement: 03/08/18 Potential to Achieve Goals: Good    Frequency Min 4X/week   Barriers to discharge        Co-evaluation PT/OT/SLP Co-Evaluation/Treatment: Yes Reason for Co-Treatment: For patient/therapist safety;To address functional/ADL transfers PT goals addressed during session: Mobility/safety with mobility OT goals addressed during session: ADL's and self-care       AM-PAC PT "6 Clicks" Daily Activity  Outcome Measure Difficulty turning over in bed (including adjusting bedclothes, sheets and blankets)?: A Little Difficulty moving from lying on back to sitting on the side of the bed? : A Little Difficulty sitting down on and standing up from a chair with arms (e.g., wheelchair, bedside commode, etc,.)?: A Little Help needed moving to and from a bed to chair (including a wheelchair)?: A Little Help needed walking in hospital room?: A Lot Help needed climbing 3-5 steps with a railing? : A Lot 6 Click Score: 16    End of Session Equipment Utilized During Treatment: Gait belt Activity Tolerance: Patient tolerated treatment well Patient left: in chair;with call bell/phone within reach Nurse Communication: Mobility status PT Visit Diagnosis: Unsteadiness on feet (R26.81);Repeated falls (R29.6)    Time: 1120-1200 PT Time Calculation (min) (ACUTE ONLY): 40 min   Charges:   PT Evaluation $PT Eval Moderate Complexity: 1 Mod PT Treatments $Gait Training: 8-22 mins   PT G Codes:        Roney Marion, PT  Acute Rehabilitation Services Pager [phone] Office [phone]   [place] 02/22/2018,  2:57 PM

## 2018-02-22 NOTE — Progress Notes (Signed)
No issues overnight. No complaints this am.  EXAM:  BP 108/88 (BP Location: Left Arm)   Pulse (!) 56   Temp (!) 97.5 F (36.4 C) (Oral)   Resp 19   Ht 5\' 10"  (1.778 m)   Wt 78.1 kg (172 lb 2.9 oz)   SpO2 94%   BMI 24.71 kg/m   Awake, alert, oriented  Speech fluent, appropriate  CN grossly intact  Mild right drift, good strength  IMPRESSION:  84 y.o. male POD# 2 s/p left bur hole for chronic SDH, improved speech - Hyponatremia - CHF  PLAN: - PT/OT eval today. - Cont fluid restriction 1.2L / day

## 2018-02-22 NOTE — Evaluation (Signed)
Occupational Therapy Evaluation Patient Details Name: Ryan Savage MRN: [id number] DOB: 01-15-1929 Today's Date: 02/22/2018    History of Present Illness Ryan Savage is a 84 y.o. male with medical history significant for ischemic cardiomyopathy with EF 30-35%, mild mitral valve prolapse confirmed by cardiac catheterization, chronic atrial fibrillation no longer on anticoagulation secondary to subdural hematoma status post burr holes on 3/17, hypertension, [name] tonically occluded LAD and RCA with extensive collaterals, dyslipidemia, BPH, [name], GERD and SIADH.  Patient was admitted to the hospital on 4/11 for dysarthria and weakness. Found to have worsening SDH, now s/p another Burr-hole procedure on 4/12   Clinical Impression   PTA, pt was living with his wife and using RW for functional mobility and assistance for LB ADLs. Pt currently requiring Min A for UB ADLs, Mod A for LB ADLs, and Min-Mod A for functional mobility. Pt presenting with decreased balance, standing tolerance, cognition, and functional performance. Pt highly motivated to participate in therapy. Pt will require further acute OT to facilitate safe dc. Recommend dc to CIR for intensive OT to optimize safety and independence with ADLs, decrease fall risk, decrease caregiver burden, and facilitate return to PLOF.     Follow Up Recommendations  CIR;Supervision/Assistance - 24 hour    Equipment Recommendations  Other (comment)(Defer to next venue)    Recommendations for Other Services Rehab consult;PT consult;Speech consult     Precautions / Restrictions Precautions Precautions: Fall Restrictions Weight Bearing Restrictions: No      Mobility Bed Mobility Overal bed mobility: Needs Assistance Bed Mobility: Sit to Supine     Supine to sit: Min guard;HOB elevated     General bed mobility comments: Min Guard A for safety   Transfers Overall transfer level: Needs assistance Equipment used: Rolling walker (2  wheeled) Transfers: Sit to/from Stand Sit to Stand: Min assist         General transfer comment: Min A to steady and stability RW. VCs for hand placement    Balance Overall balance assessment: Needs assistance Sitting-balance support: Feet supported;No upper extremity supported Sitting balance-Leahy Scale: Good Sitting balance - Comments: donning socks   Standing balance support: During functional activity;Bilateral upper extremity supported Standing balance-Leahy Scale: Poor Standing balance comment: Reliant on UE support and physical A                           ADL either performed or assessed with clinical judgement   ADL Overall ADL's : Needs assistance/impaired Eating/Feeding: Set up;Sitting   Grooming: Maximal assistance;Standing Grooming Details (indicate cue type and reason): Pt performing oral care at sink and presents with decreased balance and standing tolerance. Pt requiring Max A for maitnaining upright postioning. Pt with lright lateral lean and flexion of bilateral knees.  Upper Body Bathing: Minimal assistance;Sitting   Lower Body Bathing: Moderate assistance;Sit to/from stand   Upper Body Dressing : Minimal assistance;Sitting Upper Body Dressing Details (indicate cue type and reason): donned gown Lower Body Dressing: Moderate assistance;Sit to/from stand Lower Body Dressing Details (indicate cue type and reason): Pt able to adjust socks at EOB by bringing ankles up to EOB. Pt requiring Mod A for standing balance with dyanmic movement             Functional mobility during ADLs: Minimal assistance;Moderate assistance;Rolling walker General ADL Comments: Pt demonstrating decreased balance and activity tolerance.      Vision Baseline Vision/History: Wears glasses Wears Glasses: At all times Patient Visual Report: No change  from baseline Additional Comments: Need further assessment     Perception     Praxis      Pertinent Vitals/Pain  Pain Assessment: 0-10 Pain Score: 4  Pain Location: Head Pain Descriptors / Indicators: Discomfort;Constant Pain Intervention(s): Monitored during session;Repositioned     Hand Dominance Right   Extremity/Trunk Assessment Upper Extremity Assessment Upper Extremity Assessment: RUE deficits/detail RUE Deficits / Details: WFL strength. Noting that RUE weaker than LUE RUE Coordination: decreased fine motor;decreased gross motor   Lower Extremity Assessment Lower Extremity Assessment: Defer to PT evaluation   Cervical / Trunk Assessment Cervical / Trunk Assessment: Normal   Communication     Cognition Arousal/Alertness: Awake/alert Behavior During Therapy: WFL for tasks assessed/performed Overall Cognitive Status: Impaired/Different from baseline Area of Impairment: Problem solving;Following commands                       Following Commands: Follows one step commands with increased time;Follows multi-step commands inconsistently;Follows multi-step commands with increased time     Problem Solving: Slow processing;Requires verbal cues;Requires tactile cues General Comments: Pt with decreased problem solving and required increased time and cues during ADLs and mobility. Pt unable to perform divided attention to answer questions and complete mobility. Highly motivated to increased independence and safety as well as participate in therapy   General Comments       Exercises     Shoulder Instructions      Home Living Family/patient expects to be discharged to:: Private residence Living Arrangements: Spouse/significant other Available Help at Discharge: Family;Available 24 hours/day Type of Home: House Home Access: Stairs to enter [employer] of Steps: 3 + 3 Entrance Stairs-Rails: Right Home Layout: One level     Bathroom Shower/Tub: Walk-in Hydrologist: Standard     Home Equipment: Walker - standard;Bedside commode;Grab bars -  tub/shower;Shower seat          Prior Functioning/Environment Level of Independence: Needs assistance  Gait / Transfers Assistance Needed: Since recent admission, pt was been walking with and without AD. ADL's / Homemaking Assistance Needed: Wife having to assist with LB ADLs due to weakness   Comments: Pt reporting recent falls at home        OT Problem List: Decreased range of motion;Decreased activity tolerance;Impaired balance (sitting and/or standing);Decreased cognition;Decreased knowledge of use of DME or AE;Decreased knowledge of precautions;Pain      OT Treatment/Interventions: Self-care/ADL training;Therapeutic exercise;Energy conservation;DME and/or AE instruction;Therapeutic activities;Patient/family education    OT Goals(Current goals can be found in the care plan section) Acute Rehab OT Goals Patient Stated Goal: to go home and decrease fall risk OT Goal Formulation: With patient Time For Goal Achievement: 03/08/18 Potential to Achieve Goals: Good ADL Goals Pt Will Perform Grooming: with set-up;with supervision;standing Pt Will Perform Upper Body Dressing: with set-up;with supervision;standing Pt Will Perform Lower Body Dressing: with set-up;with supervision;sit to/from stand;with adaptive equipment Pt Will Transfer to Toilet: with set-up;with supervision;ambulating;bedside commode Pt Will Perform Toileting - Clothing Manipulation and hygiene: with set-up;with supervision;sit to/from stand  OT Frequency: Min 2X/week   Barriers to D/C:            Co-evaluation PT/OT/SLP Co-Evaluation/Treatment: Yes Reason for Co-Treatment: For patient/therapist safety;To address functional/ADL transfers   OT goals addressed during session: ADL's and self-care      AM-PAC PT "6 Clicks" Daily Activity     Outcome Measure Help from another person eating meals?: None Help from another person taking care of personal grooming?:  A Lot(standing) Help from another person  toileting, which includes using toliet, bedpan, or urinal?: A Little Help from another person bathing (including washing, rinsing, drying)?: A Lot Help from another person to put on and taking off regular upper body clothing?: A Little Help from another person to put on and taking off regular lower body clothing?: A Lot 6 Click Score: 16   End of Session Equipment Utilized During Treatment: Gait belt;Rolling walker Nurse Communication: Mobility status  Activity Tolerance: Patient tolerated treatment well Patient left: in chair;with call bell/phone within reach;with chair alarm set  OT Visit Diagnosis: Unsteadiness on feet (R26.81);Other abnormalities of gait and mobility (R26.89);Muscle weakness (generalized) (M62.81);Other symptoms and signs involving cognitive function;Pain Pain - part of body: (Head)                Time: 9295-7473 OT Time Calculation (min): 28 min Charges:  OT General Charges $OT Visit: 1 Visit OT Evaluation $OT Eval Moderate Complexity: 1 Mod G-Codes:     [place], OTR/L Acute Rehab Pager: [phone] Office: [place] 02/22/2018, 1:49 PM

## 2018-02-23 DIAGNOSIS — I2583 Coronary atherosclerosis due to lipid rich plaque: Secondary | ICD-10-CM

## 2018-02-23 DIAGNOSIS — I482 Chronic atrial fibrillation: Secondary | ICD-10-CM

## 2018-02-23 DIAGNOSIS — S065X9A Traumatic subdural hemorrhage with loss of consciousness of unspecified duration, initial encounter: Secondary | ICD-10-CM

## 2018-02-23 DIAGNOSIS — D62 Acute posthemorrhagic anemia: Secondary | ICD-10-CM

## 2018-02-23 DIAGNOSIS — T380X5A Adverse effect of glucocorticoids and synthetic analogues, initial encounter: Secondary | ICD-10-CM

## 2018-02-23 DIAGNOSIS — E871 Hypo-osmolality and hyponatremia: Secondary | ICD-10-CM

## 2018-02-23 DIAGNOSIS — I5022 Chronic systolic (congestive) heart failure: Secondary | ICD-10-CM

## 2018-02-23 DIAGNOSIS — I2510 Atherosclerotic heart disease of native coronary artery without angina pectoris: Secondary | ICD-10-CM

## 2018-02-23 DIAGNOSIS — R739 Hyperglycemia, unspecified: Secondary | ICD-10-CM

## 2018-02-23 LAB — BASIC METABOLIC PANEL
Anion gap: 6 (ref 5–15)
BUN: 15 mg/dL (ref 6–20)
CO2: 28 mmol/L (ref 22–32)
Calcium: 8.1 mg/dL — ABNORMAL LOW (ref 8.9–10.3)
Chloride: 91 mmol/L — ABNORMAL LOW (ref 101–111)
Creatinine, Ser: 0.72 mg/dL (ref 0.61–1.24)
GFR calc Af Amer: >60 mL/min (ref >60)
GFR calc non Af Amer: >60 mL/min (ref >60)
Glucose, Bld: 144 mg/dL — ABNORMAL HIGH (ref 65–99)
Potassium: 4.3 mmol/L (ref 3.5–5.1)
Sodium: 125 mmol/L — ABNORMAL LOW (ref 135–145)

## 2018-02-23 LAB — T3: T3, Total: 74 ng/dL (ref 71–180)

## 2018-02-23 NOTE — Progress Notes (Addendum)
I met with pt and his wife at bedside. Pt has been hospitalized monthly since January and pt and wife prefer an inpt rehab admit due to gradual decline in function with multiple medical co- morbidities prior to d/c home. I will begin insurance authorization with [place] [employer] for a possible admit tomorrow pending medical clearance for d/c and insurance approval. [phone]

## 2018-02-23 NOTE — Consult Note (Signed)
Physical Medicine and Rehabilitation Consult Reason for Consult: Decreased functional mobility Referring Physician: Dr. Saintclair Halsted   HPI: Ryan Savage is a 84 y.o. right-handed male with history of atrial fibrillation, [name], chronic systolic congestive heart failure.  History taken from chart review and patient. Patient with recent burr hole craniectomy left side chronic subdural hematoma 01/25/2018 and was later discharged home doing well.  He lives with spouse.  Ambulated without assistive device.  Wife assisted with some lower body ADLs.  Presented 02/19/2018 with recent falls and progressive neurologic decline.  Noted some elevated blood pressures per home health aide.  Noted hyponatremia 123.  Follow-up CT reviewed, showing left SDH.  Per report, recurrent and enlarging left side subdural hematoma.  Underwent left bur hole for evacuation of hematoma 02/20/2018 per Dr. Arnoldo Morale.  Maintained on fluid restriction for hyponatremia.  Monitoring for any signs of fluid overload.  His Lasix was held for a short time.  In regards to his atrial fibrillation, not a candidate for anticoagulation due to subdural hematoma.  Patient remains on [place] for seizure prophylaxis.  Physical therapy evaluation completed with recommendations of physical medicine rehab consult.   Review of Systems  Constitutional: Positive for malaise/fatigue. Negative for chills and fever.  HENT: Negative for hearing loss.   Eyes: Negative for blurred vision and double vision.  Respiratory: Negative for cough and shortness of breath.   Cardiovascular: Negative for chest pain.  Gastrointestinal: Positive for constipation. Negative for nausea and vomiting.       GERD  Genitourinary: Negative for dysuria, flank pain and hematuria.  Musculoskeletal: Positive for falls and joint pain.  All other systems reviewed and are negative.  Past Medical History:  Diagnosis Date  . Atrial fibrillation, chronic (HCC)    Not on anticoagulation  due to history of bilateral subdural bleeds due to recurrent falls  . BPH (benign prostatic hypertrophy)   . [name] (coronary artery disease), native coronary artery    Severe two-vessel [name] with chronically occluded LAD and RCA with widely patent left main, intermediate, and left circumflex branches.  On medical management.  He has extensive collaterals.  . 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 fib with RVR  . [name] (degenerative joint disease)   . GERD (gastroesophageal reflux disease)   . Hyperlipidemia    LDL goal less then 100  . Hypertension   . Microscopic hematuria    [name]  . Mitral valve prolapse    with moderate MR  . Myocardial infarction [facility])    "was told 11/2017 that he'd had a heart attack; don't know when it was" (02/19/2018)  . OSA (obstructive sleep apnea)    intolerant with CPAP (02/19/2018)  . SDH (subdural hematoma) ([place]) 01/2018  . SIADH (syndrome of inappropriate ADH production) ([place])   . Skin cancer    "side of my nose" (11/19/2017)  . Stroke ([place]) 12/2017   "fully recovered"  . Thrombocytopenia (HCC)    Mild- platelet count 143,000 on 02/2011, stable 08/2011   Past Surgical History:  Procedure Laterality Date  . APPENDECTOMY  1950  . BACK SURGERY    . BURR HOLE Bilateral 01/25/2018   Procedure: Haskell Flirt;  Surgeon: Kary Kos, MD;  Location: [facility];  Service: Neurosurgery;  Laterality: Bilateral;  . BURR HOLE Left 02/20/2018   Procedure: BURR HOLES  for subdural hematoma;  Surgeon: Newman Pies, MD;  Location: [facility];  Service: Neurosurgery;  Laterality: Left;  . FOREARM FRACTURE SURGERY Right 1954  . LUMBAR DISC SURGERY  1959   ruptured disc repair  . RIGHT/LEFT HEART CATH AND CORONARY ANGIOGRAPHY N/A 11/21/2017   Procedure: RIGHT/LEFT HEART CATH AND CORONARY ANGIOGRAPHY;  Surgeon: Sherren Mocha, MD;   Location: [facility];  Service: Cardiovascular;  Laterality: N/A;  . SKIN CANCER EXCISION Left    "side of my nose"   Family History  Problem Relation Age of Onset  . Heart failure Mother   . Heart attack Father   . [name] Father   . Heart attack Brother   . [name] Brother   . Colon cancer Brother   . Colon cancer Sister    Social History:  reports that he quit smoking about 61 years ago. His smoking use included cigarettes. He quit after 3.00 years of use. He has never used smokeless tobacco. He reports that he does not drink alcohol or use drugs. Allergies:  Allergies  Allergen Reactions  . Novocain [Procaine] Rash   Medications Prior to Admission  Medication Sig Dispense Refill  . acetaminophen (TYLENOL) 500 MG tablet Take 1,000 mg by mouth as needed for mild pain.    Marland Kitchen atorvastatin (LIPITOR) 20 MG tablet Take 20 mg by mouth every evening.    . digoxin (LANOXIN) 0.125 MG tablet Take 1 tablet (0.125 mg total) by mouth daily. 30 tablet 0  . docusate sodium (COLACE) 100 MG capsule Take 1 capsule (100 mg total) by mouth 2 (two) times daily. 10 capsule 0  . finasteride (PROSCAR) 5 MG tablet Take 5 mg by mouth daily.     . fluticasone (FLONASE) 50 MCG/ACT nasal spray Place 1 spray into both nostrils daily.    . furosemide (LASIX) 20 MG tablet Take 1 tablet (20 mg total) by mouth daily. 30 tablet 0  . lactulose (CHRONULAC) 10 GM/15ML solution Take 15 mLs (10 g total) by mouth 2 (two) times daily as needed for mild constipation. 240 mL 0  . metoprolol succinate (TOPROL-XL) 25 MG 24 hr tablet Take 25 mg by mouth 2 (two) times daily.    Marland Kitchen neomycin-bacitracin-polymyxin (NEOSPORIN) ointment Apply 1 application topically every 12 (twelve) hours.    . pantoprazole (PROTONIX) 40 MG tablet Take 40 mg by mouth every evening.    . sacubitril-valsartan (ENTRESTO) 24-26 MG Take 1 tablet by mouth 2 (two) times daily. 60 tablet 1    Home: Home Living Family/patient expects to be discharged to::  Private residence Living Arrangements: Spouse/significant other Available Help at Discharge: Family, Available 24 hours/day Type of Home: House Home Access: Stairs to enter [employer] of Steps: 3 + 3 Entrance Stairs-Rails: Right Home Layout: One level Bathroom Shower/Tub: Gaffer, Architectural technologist: Standard Home Equipment: Environmental consultant - standard, Bedside commode, Grab bars - tub/shower, Shower seat  Functional History: Prior Function Level of Independence: Needs assistance Gait / Transfers Assistance Needed: Since recent admission, pt was been walking with and without AD. ADL's / Homemaking Assistance Needed: Wife having to assist with LB ADLs due to weakness Comments: Pt reporting recent falls at home Functional Status:  Mobility: Bed Mobility Overal bed mobility: Needs Assistance Bed Mobility: Supine to Sit Supine to sit: Min guard, HOB elevated General bed mobility comments: Min Guard A for safety  Transfers Overall transfer level: Needs assistance Equipment used: Rolling walker (2 wheeled) Transfers: Sit to/from Stand Sit to Stand: [employer]  assist General transfer comment: Min A to steady and stability RW. VCs for hand placement Ambulation/Gait Ambulation/Gait assistance: Min guard, Min assist Ambulation Distance (Feet): 100 Feet Assistive device: Rolling walker (2 wheeled) Gait Pattern/deviations: Step-through pattern, Trunk flexed General Gait Details: Noting shorter steps, and difficulty attending to 2 tasks at once, had to stop walking to talk/answer questions; very motivated to improve and particiaptes well; showing fatigue at the end of the walk, with difficulty standing at sink after hallway ambulation    ADL: ADL Overall ADL's : Needs assistance/impaired Eating/Feeding: Set up, Sitting Grooming: Maximal assistance, Standing Grooming Details (indicate cue type and reason): Pt performing oral care at sink and presents with decreased balance and  standing tolerance. Pt requiring Max A for maitnaining upright postioning. Pt with lright lateral lean and flexion of bilateral knees.  Upper Body Bathing: Minimal assistance, Sitting Lower Body Bathing: Moderate assistance, Sit to/from stand Upper Body Dressing : Minimal assistance, Sitting Upper Body Dressing Details (indicate cue type and reason): donned gown Lower Body Dressing: Moderate assistance, Sit to/from stand Lower Body Dressing Details (indicate cue type and reason): Pt able to adjust socks at EOB by bringing ankles up to EOB. Pt requiring Mod A for standing balance with dyanmic movement Functional mobility during ADLs: Minimal assistance, Moderate assistance, Rolling walker General ADL Comments: Pt demonstrating decreased balance and activity tolerance.   Cognition: Cognition Overall Cognitive Status: Impaired/Different from baseline Orientation Level: Oriented X4 Cognition Arousal/Alertness: Awake/alert Behavior During Therapy: WFL for tasks assessed/performed Overall Cognitive Status: Impaired/Different from baseline Area of Impairment: Problem solving, Following commands Following Commands: Follows one step commands with increased time, Follows multi-step commands inconsistently, Follows multi-step commands with increased time Problem Solving: Slow processing, Requires verbal cues, Requires tactile cues General Comments: Pt with decreased problem solving and required increased time and cues during ADLs and mobility. Pt unable to perform divided attention to answer questions and complete mobility. Highly motivated to increased independence and safety as well as participate in therapy  Blood pressure 114/86, pulse (!) 54, temperature 97.9 F (36.6 C), temperature source Oral, resp. rate 18, height 5\' 10"  (1.778 m), weight 81.8 kg (180 lb 5.4 oz), SpO2 98 %. Physical Exam  Vitals reviewed. Constitutional: He is oriented to person, place, and time. He appears well-developed  and well-nourished.  HENT:  Bur hole site clean and dry  Eyes: EOM are normal. Right eye exhibits no discharge. Left eye exhibits no discharge.  Neck: Normal range of motion. Neck supple. No thyromegaly present.  Cardiovascular:  Irregularly irregular  Respiratory: Effort normal and breath sounds normal. No respiratory distress.  GI: Soft. Bowel sounds are normal. He exhibits no distension.  Musculoskeletal:  No edema or tenderness in extremities  Neurological: He is alert and oriented to person, place, and time.  Follows full commands Motor: LUE: 5/5 proximal to distal RUE: 4+/5, except for elbow/wrist extension 3-/5 B/l LE: HF, KE 4+/5, ADF 5/5  Psychiatric: His speech is delayed. He is slowed.    Results for orders placed or performed during the hospital encounter of 02/19/18 (from the past 24 hour(s))  Basic metabolic panel     Status: Abnormal   Collection Time: 02/23/18  3:51 AM  Result Value Ref Range   Sodium 125 (L) 135 - 145 mmol/L   Potassium 4.3 3.5 - 5.1 mmol/L   Chloride 91 (L) 101 - 111 mmol/L   CO2 28 22 - 32 mmol/L   Glucose, Bld 144 (H) 65 - 99 mg/dL  BUN 15 6 - 20 mg/dL   Creatinine, Ser 0.72 0.61 - 1.24 mg/dL   Calcium 8.1 (L) 8.9 - 10.3 mg/dL   GFR calc non Af Amer >60 >60 mL/min   GFR calc Af Amer >60 >60 mL/min   Anion gap 6 5 - 15   No results found.  Assessment/Plan: Diagnosis: SDH Labs and images independently reviewed.  Records reviewed and summated above.  1. Does the need for close, 24 hr/day medical supervision in concert with the patient's rehab needs make it unreasonable for this patient to be served in a less intensive setting? Potentially  2. Co-Morbidities requiring supervision/potential complications: atrial fibrillation (monitor HR with increased physical activity), [name] (cont meds), chronic systolic congestive heart failure (Monitor in accordance with increased physical activity and avoid UE resistance excercises), hyponatremia (cont to  monitor, further treatment if necessary), Steroid induced hyperglycemia (Monitor in accordance with exercise and adjust meds as necessary), ABLA (transfuse if necessary to ensure appropriate perfusion for increased activity tolerance) 3. Due to safety, skin/wound care, disease management and patient education, does the patient require 24 hr/day rehab nursing? Potentially 4. Does the patient require coordinated care of a physician, rehab nurse, PT (1-2 hrs/day, 5 days/week) and OT (1-2 hrs/day, 5 days/week) to address physical and functional deficits in the context of the above medical diagnosis(es)? Potentially Addressing deficits in the following areas: balance, endurance, locomotion, strength, transferring, bathing, dressing, toileting and psychosocial support 5. Can the patient actively participate in an intensive therapy program of at least 3 hrs of therapy per day at least 5 days per week? Yes 6. The potential for patient to make measurable gains while on inpatient rehab is good 7. Anticipated functional outcomes upon discharge from inpatient rehab are modified independent and supervision  with PT, modified independent and supervision with OT, n/a with SLP. 8. Estimated rehab length of stay to reach the above functional goals is: 7-10 days. 9. Anticipated D/C setting: Home 10. Anticipated post D/C treatments: HH therapy and Home excercise program 11. Overall Rehab/Functional Prognosis: good  RECOMMENDATIONS: This patient's condition is appropriate for continued rehabilitative care in the following setting: Will continue to follow and consider CIR if patient does not progress with therapies to baseline. Patient has agreed to participate in recommended program. Yes Note that insurance prior authorization may be required for reimbursement for recommended care.  Comment: Rehab Admissions Coordinator to follow up.  Delice Lesch, MD, ABPMR Lavon Paganini Angiulli, PA-C 02/23/2018

## 2018-02-23 NOTE — Telephone Encounter (Signed)
I called and left multiple messages to call back. Will close encounter.

## 2018-02-23 NOTE — Plan of Care (Signed)
Pt alert oriented and able to follow commands.  VSS.  Atrial fib noted.  Tolerating PT.

## 2018-02-23 NOTE — Progress Notes (Signed)
PROGRESS NOTE    Ryan Savage  ZJQ:[id number] DOB: 08-19-1929 DOA: 02/19/2018 PCP: Lajean Manes, MD      Brief Narrative:  Ryan Savage is an 84 y.o. M with CHF EF 30-35%, chronic hyponatremia, cAF not on AC due to recent SDH, HTN, and coronary disease who presents with acute decreased mental status, found to have woresening of his SDH, taken to OR by Dr. Arnoldo Morale for burr-hole craniotomy and evacuation of SDH.     Assessment & Plan:  Subdural hematoma Care per Neurosurgery  Hyponatremia Recorded intake less than 1 L yesterday, net -720 cc.  Sodium unchanged 125, close to baseline 130s.  Symptomatically.   -Continue fluid restriction -Monitor I/Os, daily weights -Daily BMP for now -At present his Na appears to be improving with conservative measures, do not feel additional therapies demeclocycline or vaptan warranted    Chronic systolic CHF Coronary artery disease Hypertension Appears euvolemic on exam and recorded I/Os net negative, but charted weights rising.  Echo January EF 30%.  Ischemic (LHC in January with 2 vessel [name], medical therapy alone).  New onset. -Continue statin, digoxin -Continue metoprolol -Restart furosemide tomorrow -Defer Entresto restart to after discharge   Chronic atrial fibrillation Rate controlled at present.  Not AC candidate.  No previous stroke.  CHADs2Vasc only 4.   -Continue metoprolol, digoxin       Code Status: FULL Family Communication: Wife at bedside MDM and disposition Plan: The below labs and imaging reviewed, summarized above.  The patient was admitted for leg weakness and confusion in the setting of recurrent SDH.  This was evacuated by neurosurgery 3 days ago, since he has been doing well.  We are consulted for sodium management in the context of acute on chronic asymptomatic hyponatremia.       Procedures:  Left burr holes for evacuation of subdural hematoma      Subjective: Feels well, good appetite.  Still  globally weak, but getting stronger.  No focal leg weakness, numbness, seizures, tremor, confusion.   No dyspnea on exertion, leg swelling.  Objective: Vitals:   02/22/18 2018 02/23/18 0000 02/23/18 0400 02/23/18 0402  BP: 122/75 (!) 92/52 114/86   Pulse: 74 72 (!) 54   Resp:   18   Temp: 97.7 F (36.5 C) 97.9 F (36.6 C) 97.9 F (36.6 C)   TempSrc: Oral Oral Oral   SpO2: 97% 97% 98%   Weight:    81.8 kg (180 lb 5.4 oz)  Height:        Intake/Output Summary (Last 24 hours) at 02/23/2018 0827 Last data filed at 02/23/2018 0400 Gross per 24 hour  Intake 780.83 ml  Output 1850 ml  Net -1069.17 ml   Filed Weights   02/21/18 0500 02/22/18 0500 02/23/18 0402  Weight: 79 kg (174 lb 2.6 oz) 78.1 kg (172 lb 2.9 oz) 81.8 kg (180 lb 5.4 oz)    Examination: General appearance: Elderly male, sitting in a chair, finishing his breakfast, no acute distress HEENT: Glasses on.  Anicteric, conjunctiva pink, lids and lashes normal. No nasal deformity, discharge, epistaxis.  Lips moist, dentition good. Hearing good.  Oropharynx moist. Skin: Warm and dry, no suspicious rashes, lesions, induration, redness. Cardiac: Regular rate and rhythm, soft systolic murmur, no lower extremity edema, no jugular venous distention.  Respiratory: Respiratory effort normal, no rales or wheezes. Abdomen: No TTP, abdomen soft, no distension, no HSM. MSK: No deformities or effusions in large joints of upper or lower extremities bilaterally. Neuro: Awake and alert,  cranial nerves symmetric, sensation intact light touch. Psych: Oriented to person, place, time, attention normal, affect, judgment and insight normal.    Data Reviewed: I have personally reviewed following labs and imaging studies:  CBC: Recent Labs  Lab 02/19/18 1827  WBC 6.3  HGB 12.6*  HCT 36.5*  MCV 91.3  PLT 564   Basic Metabolic Panel: Recent Labs  Lab 02/19/18 1827 02/20/18 0437 02/21/18 0233 02/22/18 0433 02/23/18 0351  NA 122*  123* 122* 125* 125*  K 4.3 4.2 3.8 4.2 4.3  CL 86* 88* 89* 93* 91*  CO2 27 24 23 25 28   GLUCOSE 104* 179* 205* 171* 144*  BUN 13 13 18 17 15   CREATININE 0.84 0.81 0.93 0.73 0.72  CALCIUM 8.6* 8.7* 8.2* 8.2* 8.1*   GFR: Estimated Creatinine Clearance: 68.4 mL/min (by C-G formula based on SCr of 0.72 mg/dL). Liver Function Tests: Recent Labs  Lab 02/21/18 0233  AST 18  ALT 10*  ALKPHOS 49  BILITOT 0.5  PROT 5.4*  ALBUMIN 2.9*   No results for input(s): LIPASE, AMYLASE in the last 168 hours. No results for input(s): AMMONIA in the last 168 hours. Coagulation Profile: No results for input(s): INR, PROTIME in the last 168 hours. Cardiac Enzymes: No results for input(s): CKTOTAL, CKMB, CKMBINDEX, TROPONINI in the last 168 hours. BNP (last 3 results) No results for input(s): PROBNP in the last 8760 hours. HbA1C: No results for input(s): HGBA1C in the last 72 hours. CBG: No results for input(s): GLUCAP in the last 168 hours. Lipid Profile: No results for input(s): CHOL, HDL, LDLCALC, TRIG, CHOLHDL, LDLDIRECT in the last 72 hours. Thyroid Function Tests: Recent Labs    02/20/18 1445 02/20/18 1940  TSH 0.224*  --   FREET4  --  1.29*   Anemia Panel: No results for input(s): VITAMINB12, FOLATE, FERRITIN, TIBC, IRON, RETICCTPCT in the last 72 hours. Urine analysis:    Component Value Date/Time   COLORURINE YELLOW 01/23/2018 1310   APPEARANCEUR CLEAR 01/23/2018 1310   LABSPEC 1.009 01/23/2018 1310   PHURINE 7.0 01/23/2018 1310   GLUCOSEU NEGATIVE 01/23/2018 1310   HGBUR SMALL (A) 01/23/2018 1310   BILIRUBINUR NEGATIVE 01/23/2018 1310   KETONESUR NEGATIVE 01/23/2018 1310   PROTEINUR NEGATIVE 01/23/2018 1310   NITRITE NEGATIVE 01/23/2018 1310   LEUKOCYTESUR NEGATIVE 01/23/2018 1310   Sepsis Labs: @LABRCNTIP (procalcitonin:4,lacticacidven:4)  ) Recent Results (from the past 240 hour(s))  Surgical PCR screen     Status: None   Collection Time: 02/20/18  1:06 PM    Result Value Ref Range Status   MRSA, PCR NEGATIVE NEGATIVE Final   Staphylococcus aureus NEGATIVE NEGATIVE Final    Comment: (NOTE) The Xpert SA Assay (FDA approved for NASAL specimens in patients 56 years of age and older), is one component of a comprehensive surveillance program. It is not intended to diagnose infection nor to guide or monitor treatment. Performed at [facility], [place] [address], [place], [place] [address]   MRSA PCR Screening     Status: None   Collection Time: 02/20/18 10:16 PM  Result Value Ref Range Status   MRSA by PCR NEGATIVE NEGATIVE Final    Comment:        The GeneXpert MRSA Assay (FDA approved for NASAL specimens only), is one component of a comprehensive MRSA colonization surveillance program. It is not intended to diagnose MRSA infection nor to guide or monitor treatment for MRSA infections. Performed at [facility], [place],  [place] [address]          Radiology Studies: No results found.      Scheduled Meds: . atorvastatin  20 mg Oral QPM  . Chlorhexidine Gluconate Cloth  6 each Topical Q0600  . dexamethasone  4 mg Intravenous Q8H  . digoxin  0.125 mg Oral Daily  . docusate sodium  100 mg Oral BID  . feeding supplement (ENSURE ENLIVE)  237 mL Oral BID BM  . finasteride  5 mg Oral Daily  . fluticasone  1 spray Each Nare Daily  . levETIRAcetam  500 mg Oral BID  . metoprolol succinate  25 mg Oral Daily  . pantoprazole  40 mg Oral QHS   Continuous Infusions:    LOS: 3 days    Time spent: 25 minutes    Edwin Dada, MD [facility] 02/23/2018, 9:20 AM    Pager [phone] --- please page though AMION:  [email or web address] If 7PM-7AM, please contact night-coverage

## 2018-02-23 NOTE — Progress Notes (Signed)
Neurosurgery Progress Note  No issues overnight. Continues to endorse LE weakness (chronic) Denies HA, focal deficits  EXAM:  BP 114/86 (BP Location: Left Arm)   Pulse (!) 54   Temp 97.9 F (36.6 C) (Oral)   Resp 18   Ht 5\' 10"  (1.778 m)   Wt 81.8 kg (180 lb 5.4 oz)   SpO2 98%   BMI 25.88 kg/m   Awake, alert, oriented  Speech fluent, appropriate  MAEW with good strength CN grossly intact Incision c/d/i  PLAN Doing well this am  Generalized weakness: no focal deficits, more unsteady on feet after long periods of time. PT/OT rec CIR. Consult placed. Agree this would be in his best interest  Hyponatremia: stable. Appreciate TH assistance with care.  No new recs.  Continue current care.

## 2018-02-23 NOTE — Progress Notes (Signed)
Physical Therapy Treatment Patient Details Name: Ryan Savage MRN: [id number] DOB: [birth year] Today's Date: 02/23/2018    History of Present Illness Ryan Savage is a 84 y.o. male with medical history significant for ischemic cardiomyopathy with EF 30-35%, mild mitral valve prolapse confirmed by cardiac catheterization, chronic atrial fibrillation no longer on anticoagulation secondary to subdural hematoma status post burr holes on 3/17, hypertension, [name] tonically occluded LAD and RCA with extensive collaterals, dyslipidemia, BPH, [name], GERD and SIADH.  Patient was admitted to the hospital on 4/11 for dysarthria and weakness. Found to have worsening SDH, now s/p another Burr-hole procedure on 4/12    PT Comments    Pt progressing towards functional mobility goals. Pt requiring physical assist during transfers and ambulation with RW at this time. Noted increase in lower extremity fatigue causing increased instability during ambulation with increasing distances. Pt highly motivated to participate in therapies and return to baseline level of function. Continue to feel pt would greatly benefit from CIR level therapies following d/c. Will continue to follow acutely and progress as tolerated.    Follow Up Recommendations  CIR     Equipment Recommendations  None recommended by PT    Recommendations for Other Services Rehab consult     Precautions / Restrictions Precautions Precautions: Fall Restrictions Weight Bearing Restrictions: No    Mobility  Bed Mobility               General bed mobility comments: pt in recliner upon PT arrival and returned to recliner at end of session  Transfers Overall transfer level: Needs assistance Equipment used: Rolling walker (2 wheeled) Transfers: Sit to/from Stand Sit to Stand: Min assist         General transfer comment: min assist for steadying and to stabilize RW. VCs for hand placement  Ambulation/Gait Ambulation/Gait  assistance: Min assist Ambulation Distance (Feet): 115 Feet Assistive device: Rolling walker (2 wheeled) Gait Pattern/deviations: Step-through pattern;Trunk flexed Gait velocity: decreased Gait velocity interpretation: <1.8 ft/sec, indicate of risk for recurrent falls General Gait Details: pt becomes easily fatigued with significant increase in flexed posture and prominent increase in knee flexion/decreased step length with increasing distance requiring min assist for steadying.    Stairs             Wheelchair Mobility    Modified Rankin (Stroke Patients Only) Modified Rankin (Stroke Patients Only) Pre-Morbid Rankin Score: Moderate disability Modified Rankin: Moderately severe disability     Balance Overall balance assessment: Needs assistance Sitting-balance support: Feet supported;No upper extremity supported Sitting balance-Leahy Scale: Good     Standing balance support: During functional activity;Bilateral upper extremity supported Standing balance-Leahy Scale: Poor Standing balance comment: Reliant on UE support and physical A                            Cognition Arousal/Alertness: Awake/alert Behavior During Therapy: WFL for tasks assessed/performed Overall Cognitive Status: Impaired/Different from baseline Area of Impairment: Problem solving;Following commands                       Following Commands: Follows one step commands with increased time;Follows multi-step commands inconsistently;Follows multi-step commands with increased time     Problem Solving: Slow processing;Requires verbal cues;Requires tactile cues General Comments: pt requiring increased time and cues for command following. demonstrates increased difficulty and instability with dual tasking. pt has to stop walking to respond to questions      Exercises  General Comments General comments (skin integrity, edema, etc.): VSS throughout      Pertinent Vitals/Pain  Pain Assessment: Faces Faces Pain Scale: Hurts little more Pain Location: Head/Skull Pain Descriptors / Indicators: Aching;Constant;Discomfort Pain Intervention(s): Monitored during session;Limited activity within patient's tolerance;Repositioned    Home Living                      Prior Function            PT Goals (current goals can now be found in the care plan section) Acute Rehab PT Goals Patient Stated Goal: to go home and decrease fall risk PT Goal Formulation: With patient Time For Goal Achievement: 03/08/18 Potential to Achieve Goals: Good Progress towards PT goals: Progressing toward goals    Frequency    Min 4X/week      PT Plan Current plan remains appropriate    Co-evaluation              AM-PAC PT "6 Clicks" Daily Activity  Outcome Measure  Difficulty turning over in bed (including adjusting bedclothes, sheets and blankets)?: A Little Difficulty moving from lying on back to sitting on the side of the bed? : A Little Difficulty sitting down on and standing up from a chair with arms (e.g., wheelchair, bedside commode, etc,.)?: A Little Help needed moving to and from a bed to chair (including a wheelchair)?: A Little Help needed walking in hospital room?: A Little Help needed climbing 3-5 steps with a railing? : A Lot 6 Click Score: 17    End of Session Equipment Utilized During Treatment: Gait belt Activity Tolerance: Patient tolerated treatment well Patient left: in chair;with call bell/phone within reach Nurse Communication: Mobility status PT Visit Diagnosis: Unsteadiness on feet (R26.81);Repeated falls (R29.6)     Time: 9833-8250 PT Time Calculation (min) (ACUTE ONLY): 16 min  Charges:  $Gait Training: 8-22 mins                    G Codes:       Vic Ripper, SPT   Vic Ripper 02/23/2018, 12:04 PM

## 2018-02-24 ENCOUNTER — Inpatient Hospital Stay (HOSPITAL_COMMUNITY)
Admission: RE | Admit: 2018-02-24 | Discharge: 2018-03-05 | DRG: 949 | Disposition: A | Discharge status: Home Health | Payer: Medicare Other | Source: Intra-hospital | Attending: Physical Medicine & Rehabilitation | Admitting: Physical Medicine & Rehabilitation

## 2018-02-24 ENCOUNTER — Other Ambulatory Visit: Payer: Self-pay

## 2018-02-24 DIAGNOSIS — S065XAA Traumatic subdural hemorrhage with loss of consciousness status unknown, initial encounter: Secondary | ICD-10-CM | POA: Diagnosis present

## 2018-02-24 DIAGNOSIS — I110 Hypertensive heart disease with heart failure: Secondary | ICD-10-CM | POA: Diagnosis present

## 2018-02-24 DIAGNOSIS — I482 Chronic atrial fibrillation: Secondary | ICD-10-CM | POA: Diagnosis present

## 2018-02-24 DIAGNOSIS — R7989 Other specified abnormal findings of blood chemistry: Secondary | ICD-10-CM | POA: Diagnosis not present

## 2018-02-24 DIAGNOSIS — Z79899 Other long term (current) drug therapy: Secondary | ICD-10-CM | POA: Diagnosis not present

## 2018-02-24 DIAGNOSIS — Z8673 Personal history of transient ischemic attack (TIA), and cerebral infarction without residual deficits: Secondary | ICD-10-CM | POA: Diagnosis not present

## 2018-02-24 DIAGNOSIS — K5900 Constipation, unspecified: Secondary | ICD-10-CM | POA: Diagnosis present

## 2018-02-24 DIAGNOSIS — Z85828 Personal history of other malignant neoplasm of skin: Secondary | ICD-10-CM

## 2018-02-24 DIAGNOSIS — R2689 Other abnormalities of gait and mobility: Secondary | ICD-10-CM | POA: Diagnosis present

## 2018-02-24 DIAGNOSIS — S065X1S Traumatic subdural hemorrhage with loss of consciousness of 30 minutes or less, sequela: Secondary | ICD-10-CM | POA: Diagnosis not present

## 2018-02-24 DIAGNOSIS — Z87891 Personal history of nicotine dependence: Secondary | ICD-10-CM

## 2018-02-24 DIAGNOSIS — G4733 Obstructive sleep apnea (adult) (pediatric): Secondary | ICD-10-CM | POA: Diagnosis present

## 2018-02-24 DIAGNOSIS — E871 Hypo-osmolality and hyponatremia: Secondary | ICD-10-CM | POA: Diagnosis present

## 2018-02-24 DIAGNOSIS — S065X9D Traumatic subdural hemorrhage with loss of consciousness of unspecified duration, subsequent encounter: Principal | ICD-10-CM

## 2018-02-24 DIAGNOSIS — I252 Old myocardial infarction: Secondary | ICD-10-CM | POA: Diagnosis not present

## 2018-02-24 DIAGNOSIS — E785 Hyperlipidemia, unspecified: Secondary | ICD-10-CM | POA: Diagnosis present

## 2018-02-24 DIAGNOSIS — K219 Gastro-esophageal reflux disease without esophagitis: Secondary | ICD-10-CM | POA: Diagnosis present

## 2018-02-24 DIAGNOSIS — G8191 Hemiplegia, unspecified affecting right dominant side: Secondary | ICD-10-CM | POA: Diagnosis not present

## 2018-02-24 DIAGNOSIS — S065X0D Traumatic subdural hemorrhage without loss of consciousness, subsequent encounter: Secondary | ICD-10-CM

## 2018-02-24 DIAGNOSIS — S065X9A Traumatic subdural hemorrhage with loss of consciousness of unspecified duration, initial encounter: Secondary | ICD-10-CM | POA: Diagnosis present

## 2018-02-24 DIAGNOSIS — I5022 Chronic systolic (congestive) heart failure: Secondary | ICD-10-CM | POA: Diagnosis present

## 2018-02-24 DIAGNOSIS — I2510 Atherosclerotic heart disease of native coronary artery without angina pectoris: Secondary | ICD-10-CM | POA: Diagnosis present

## 2018-02-24 DIAGNOSIS — E222 Syndrome of inappropriate secretion of antidiuretic hormone: Secondary | ICD-10-CM | POA: Diagnosis not present

## 2018-02-24 DIAGNOSIS — N401 Enlarged prostate with lower urinary tract symptoms: Secondary | ICD-10-CM | POA: Diagnosis present

## 2018-02-24 LAB — BASIC METABOLIC PANEL
Anion gap: 7 (ref 5–15)
BUN: 18 mg/dL (ref 6–20)
CO2: 28 mmol/L (ref 22–32)
Calcium: 8.3 mg/dL — ABNORMAL LOW (ref 8.9–10.3)
Chloride: 90 mmol/L — ABNORMAL LOW (ref 101–111)
Creatinine, Ser: 0.74 mg/dL (ref 0.61–1.24)
GFR calc Af Amer: >60 mL/min (ref >60)
GFR calc non Af Amer: >60 mL/min (ref >60)
Glucose, Bld: 139 mg/dL — ABNORMAL HIGH (ref 65–99)
Potassium: 4.6 mmol/L (ref 3.5–5.1)
Sodium: 125 mmol/L — ABNORMAL LOW (ref 135–145)

## 2018-02-24 MED ORDER — ONDANSETRON HCL 4 MG PO TABS: 4.0000 mg | ORAL_TABLET | ORAL | Status: DC | PRN

## 2018-02-24 MED ORDER — METOPROLOL SUCCINATE ER 25 MG PO TB24
25.0000 mg | ORAL_TABLET | Freq: Every day | ORAL | Status: DC
  Administered 2018-02-25 – 2018-03-05 (×9): 25 mg via ORAL
  Filled 2018-02-24 (×10): qty 1

## 2018-02-24 MED ORDER — LEVETIRACETAM 500 MG PO TABS
500.0000 mg | ORAL_TABLET | Freq: Two times a day (BID) | ORAL | Status: DC
  Administered 2018-02-24 – 2018-03-05 (×18): 500 mg via ORAL
  Filled 2018-02-24 (×18): qty 1

## 2018-02-24 MED ORDER — FINASTERIDE 5 MG PO TABS
5.0000 mg | ORAL_TABLET | Freq: Every day | ORAL | Status: DC
  Administered 2018-02-25 – 2018-03-05 (×9): 5 mg via ORAL
  Filled 2018-02-24 (×9): qty 1

## 2018-02-24 MED ORDER — DIGOXIN 125 MCG PO TABS
0.1250 mg | ORAL_TABLET | Freq: Every day | ORAL | Status: DC
  Administered 2018-02-25 – 2018-03-05 (×9): 0.125 mg via ORAL
  Filled 2018-02-24 (×9): qty 1

## 2018-02-24 MED ORDER — ACETAMINOPHEN 650 MG RE SUPP: 650.0000 mg | RECTAL | Status: DC | PRN

## 2018-02-24 MED ORDER — HYDROCODONE-ACETAMINOPHEN 5-325 MG PO TABS: 1.0000 | ORAL_TABLET | ORAL | 0 refills | Status: DC | PRN

## 2018-02-24 MED ORDER — FLUTICASONE PROPIONATE 50 MCG/ACT NA SUSP
1.0000 | Freq: Every day | NASAL | Status: DC
  Administered 2018-02-25 – 2018-03-05 (×5): 1 via NASAL
  Filled 2018-02-24: qty 16

## 2018-02-24 MED ORDER — DEXAMETHASONE 4 MG PO TABS
4.0000 mg | ORAL_TABLET | Freq: Three times a day (TID) | ORAL | Status: DC
  Administered 2018-02-24 – 2018-02-26 (×5): 4 mg via ORAL
  Filled 2018-02-24 (×5): qty 1

## 2018-02-24 MED ORDER — ACETAMINOPHEN 325 MG PO TABS
650.0000 mg | ORAL_TABLET | ORAL | Status: DC | PRN
  Administered 2018-02-25 (×2): 650 mg via ORAL
  Filled 2018-02-24 (×2): qty 2

## 2018-02-24 MED ORDER — ENSURE ENLIVE PO LIQD
237.0000 mL | Freq: Two times a day (BID) | ORAL | Status: DC
  Administered 2018-02-25 – 2018-03-05 (×13): 237 mL via ORAL

## 2018-02-24 MED ORDER — ONDANSETRON HCL 4 MG/2ML IJ SOLN: 4.0000 mg | INTRAMUSCULAR | Status: DC | PRN

## 2018-02-24 MED ORDER — LACTULOSE 10 GM/15ML PO SOLN: 10.0000 g | Freq: Two times a day (BID) | ORAL | Status: DC | PRN

## 2018-02-24 MED ORDER — PANTOPRAZOLE SODIUM 40 MG PO TBEC
40.0000 mg | DELAYED_RELEASE_TABLET | Freq: Every day | ORAL | Status: DC
  Administered 2018-02-24 – 2018-03-04 (×9): 40 mg via ORAL
  Filled 2018-02-24 (×9): qty 1

## 2018-02-24 MED ORDER — METHYLPREDNISOLONE 4 MG PO TBPK: ORAL_TABLET | ORAL | 0 refills | Status: DC

## 2018-02-24 MED ORDER — ATORVASTATIN CALCIUM 20 MG PO TABS
20.0000 mg | ORAL_TABLET | Freq: Every evening | ORAL | Status: DC
  Administered 2018-02-25 – 2018-03-04 (×8): 20 mg via ORAL
  Filled 2018-02-24 (×8): qty 1

## 2018-02-24 MED ORDER — LEVETIRACETAM 500 MG PO TABS: 500.0000 mg | ORAL_TABLET | Freq: Two times a day (BID) | ORAL | 2 refills | Status: DC

## 2018-02-24 MED ORDER — DOCUSATE SODIUM 100 MG PO CAPS
100.0000 mg | ORAL_CAPSULE | Freq: Two times a day (BID) | ORAL | Status: DC
  Administered 2018-02-25 – 2018-03-05 (×12): 100 mg via ORAL
  Filled 2018-02-24 (×17): qty 1

## 2018-02-24 MED ORDER — HYDROCODONE-ACETAMINOPHEN 5-325 MG PO TABS
1.0000 | ORAL_TABLET | ORAL | Status: DC | PRN
  Administered 2018-02-27 – 2018-02-28 (×2): 1 via ORAL
  Filled 2018-02-24 (×3): qty 1

## 2018-02-24 MED FILL — Thrombin For Soln 20000 Unit: CUTANEOUS | Qty: 1 | Status: AC

## 2018-02-24 NOTE — Progress Notes (Signed)
PROGRESS NOTE    Raymone Grandville Silos  EHM:[id number] DOB: May 02, 1929 DOA: 02/19/2018 PCP: Lajean Manes, MD      Brief Narrative:  Mr. Alban is an 84 y.o. M with CHF EF 30-35%, chronic hyponatremia, cAF not on AC due to recent SDH, HTN, and coronary disease who presents with acute decreased mental status, found to have woresening of his SDH, taken to OR by Dr. Arnoldo Morale for burr-hole craniotomy and evacuation of SDH.     Assessment & Plan:    Hyponatremia Again Recorded intake less than 1 L yesterday, net negative again.  Sodium unchanged 125 again, close to baseline 130s.  Asymptomatic still.   -Continue fluid restriction -Monitor I/Os, daily weights more accurately -Continue daily BMP until CIR discharge, then can be spaced out, still feel vaptan, etc therapy not warranted in this asymptomatic patient    Chronic systolic CHF Coronary artery disease Hypertension Euvolemic on exam.  Poorly documented I/Os, weights. Unreliable.  Echo January EF 30%.  Ischemic (LHC in January with 2 vessel [name], medical therapy alone).  New onset.  Some bradycardia and pauses overnight.  -Continue statin, digoxin, metoprolol  -Defer restarting furosemide until oral intake improves -Defer Entresto restart to after discharge     Chronic A fib Rate controlled at present.  Not AC candidate.  No previous stroke.  CHADs2Vasc only 4.   -Continue metoprolol, digoxin       Code Status: FULL Family Communication: Wife at bedside MDM and disposition Plan: The below labs and imaging reviewed, summarized above.  Patient was admitted for SDH. Labs and vitals below reviewed, summarized above.  Above medicine continued.     We are consulted for sodium management in the context of acute on chronic asymptomatic hyponatremia.   We will follow along.        Procedures:  Left burr holes for evacuation of subdural hematoma      Subjective: Feels well.  No sleep last night because he was cold.   No new seizures, fever, confusion, tremor.  No new focal neurological deficits.  No orthopnea, leg swelling, dyspnea on exertion.        Objective: Vitals:   02/24/18 0000 02/24/18 0209 02/24/18 0400 02/24/18 0745  BP: 126/69  121/85 130/81  Pulse: 64  (!) 49 (!) 51  Resp: 16  16 14   Temp: 97.7 F (36.5 C)  97.9 F (36.6 C) 97.8 F (36.6 C)  TempSrc: Oral  Oral Oral  SpO2: 96%  99% 100%  Weight:  81.1 kg (178 lb 12.7 oz)    Height:        Intake/Output Summary (Last 24 hours) at 02/24/2018 3662 Last data filed at 02/24/2018 0800 Gross per 24 hour  Intake 600 ml  Output 2975 ml  Net -2375 ml   Filed Weights   02/22/18 0500 02/23/18 0402 02/24/18 0209  Weight: 78.1 kg (172 lb 2.9 oz) 81.8 kg (180 lb 5.4 oz) 81.1 kg (178 lb 12.7 oz)    Examination: General appearance: Elderly male, sitting up in chair, conversational NAD HEENT: Anicteric, conjunctiva pink.  Hearing normal.  Lips moist, dentition good, OP moist, no oral lesions.   Skin: Warm and dry, no suspicious rashes, lesions, induration, redness. Cardiac: Slow, regular.  No murmurs.  No LE edema.  JVP normal. Respiratory: Breathing easy and unlabored, no rlaes or wheezes. Abdomen: No TTP, abdomen soft, no distension, no HSM. MSK: No deformities or effusions in large joints of upper or lower extremities bilaterally. Neuro: Awake and alert.  Cranial nerves normal>  Strenght 5/5 in upper and lower extremities bilaterally. Psych: Alert and oriented to person, place, time and situation.  Attention normal.  Affect normal.    Data Reviewed: I have personally reviewed following labs and imaging studies:  CBC: Recent Labs  Lab 02/19/18 1827  WBC 6.3  HGB 12.6*  HCT 36.5*  MCV 91.3  PLT 119   Basic Metabolic Panel: Recent Labs  Lab 02/20/18 0437 02/21/18 0233 02/22/18 0433 02/23/18 0351 02/24/18 0341  NA 123* 122* 125* 125* 125*  K 4.2 3.8 4.2 4.3 4.6  CL 88* 89* 93* 91* 90*  CO2 24 23 25 28 28   GLUCOSE 179*  205* 171* 144* 139*  BUN 13 18 17 15 18   CREATININE 0.81 0.93 0.73 0.72 0.74  CALCIUM 8.7* 8.2* 8.2* 8.1* 8.3*   GFR: Estimated Creatinine Clearance: 68.4 mL/min (by C-G formula based on SCr of 0.74 mg/dL). Liver Function Tests: Recent Labs  Lab 02/21/18 0233  AST 18  ALT 10*  ALKPHOS 49  BILITOT 0.5  PROT 5.4*  ALBUMIN 2.9*   No results for input(s): LIPASE, AMYLASE in the last 168 hours. No results for input(s): AMMONIA in the last 168 hours. Coagulation Profile: No results for input(s): INR, PROTIME in the last 168 hours. Cardiac Enzymes: No results for input(s): CKTOTAL, CKMB, CKMBINDEX, TROPONINI in the last 168 hours. BNP (last 3 results) No results for input(s): PROBNP in the last 8760 hours. HbA1C: No results for input(s): HGBA1C in the last 72 hours. CBG: No results for input(s): GLUCAP in the last 168 hours. Lipid Profile: No results for input(s): CHOL, HDL, LDLCALC, TRIG, CHOLHDL, LDLDIRECT in the last 72 hours. Thyroid Function Tests: No results for input(s): TSH, T4TOTAL, FREET4, T3FREE, THYROIDAB in the last 72 hours. Anemia Panel: No results for input(s): VITAMINB12, FOLATE, FERRITIN, TIBC, IRON, RETICCTPCT in the last 72 hours. Urine analysis:    Component Value Date/Time   COLORURINE YELLOW 01/23/2018 1310   APPEARANCEUR CLEAR 01/23/2018 1310   LABSPEC 1.009 01/23/2018 1310   PHURINE 7.0 01/23/2018 1310   GLUCOSEU NEGATIVE 01/23/2018 1310   HGBUR SMALL (A) 01/23/2018 1310   BILIRUBINUR NEGATIVE 01/23/2018 1310   KETONESUR NEGATIVE 01/23/2018 1310   PROTEINUR NEGATIVE 01/23/2018 1310   NITRITE NEGATIVE 01/23/2018 1310   LEUKOCYTESUR NEGATIVE 01/23/2018 1310   Sepsis Labs: @LABRCNTIP (procalcitonin:4,lacticacidven:4)  ) Recent Results (from the past 240 hour(s))  Surgical PCR screen     Status: None   Collection Time: 02/20/18  1:06 PM  Result Value Ref Range Status   MRSA, PCR NEGATIVE NEGATIVE Final   Staphylococcus aureus NEGATIVE NEGATIVE  Final    Comment: (NOTE) The Xpert SA Assay (FDA approved for NASAL specimens in patients 66 years of age and older), is one component of a comprehensive surveillance program. It is not intended to diagnose infection nor to guide or monitor treatment. Performed at [facility], [place] [address] [place], [place] [address]   MRSA PCR Screening     Status: None   Collection Time: 02/20/18 10:16 PM  Result Value Ref Range Status   MRSA by PCR NEGATIVE NEGATIVE Final    Comment:        The GeneXpert MRSA Assay (FDA approved for NASAL specimens only), is one component of a comprehensive MRSA colonization surveillance program. It is not intended to diagnose MRSA infection nor to guide or monitor treatment for MRSA infections. Performed at [facility], [place] [address] [place], [place] [address]  Radiology Studies: No results found.      Scheduled Meds: . atorvastatin  20 mg Oral QPM  . Chlorhexidine Gluconate Cloth  6 each Topical Q0600  . dexamethasone  4 mg Intravenous Q8H  . digoxin  0.125 mg Oral Daily  . docusate sodium  100 mg Oral BID  . feeding supplement (ENSURE ENLIVE)  237 mL Oral BID BM  . finasteride  5 mg Oral Daily  . fluticasone  1 spray Each Nare Daily  . levETIRAcetam  500 mg Oral BID  . metoprolol succinate  25 mg Oral Daily  . pantoprazole  40 mg Oral QHS   Continuous Infusions:    LOS: 4 days    Time spent: 25 minmutes   Edwin Dada, MD [facility] 02/24/2018, 8:30 AM    Pager [phone] --- please page though AMION:  [email or web address] If 7PM-7AM, please contact night-coverage

## 2018-02-24 NOTE — PMR Pre-admission (Signed)
PMR Admission Coordinator Pre-Admission Assessment  Patient: Ryan Savage is an 84 y.o., male MRN: [id number] DOB: 04-Jan-1929 Height: 5\' 10"  (177.8 cm) Weight: 81.1 kg (178 lb 12.7 oz)              Insurance Information HMO:     PPO: yes     PCP:      IPA:      80/20:      OTHER: Medicare advantage plan PRIMARY: [place] Medicare      Policy#: [id number]      Subscriber: pt CM Name: Orvan July      Phone#: [phone]     Fax#: [phone] Pre-Cert#: [id number] approved for 7 days. F/u Dorthula Nettles phone [phone] fax [phone]      Employer: retired Benefits:  Phone #: [phone]     Name: 4/16 Eff. Date: 11/11/2017     Deduct: none      Out of Pocket Max: $4000      Life Max: none CIR: $160 co pay per day days 1 until 10      SNF: no co pay days 1 to 20; $50 co pay per day days 21-100 Outpatient: $20 co pay per visit     Co-Pay: visits per medical neccesity Home Health: 100%      Co-Pay: visits per medical neccesity DME: 80%     Co-Pay: 20% Providers: in network  SECONDARY: none      Medicaid Application Date:       Case Manager:  Disability Application Date:       Case Worker:   Emergency Facilities manager Information    Name Relation Home Work Mobile   [place] Spouse [phone]  [phone]   [place], [place]   [phone]     Current Medical History  Patient Admitting Diagnosis: SDH  History of Present Illness: Thompsonis a 84 y.o.right-handed malewith history of atrial fibrillation, [name], chronic systolic congestive heart failure.Patient with recent burr hole craniectomy left side chronic subdural hematoma 01/25/2018 and was later discharged home doing well. Presented 02/19/2018 with recent falls and progressive neurologic decline. Noted some elevated blood pressures per home health aide. Noted hyponatremia 123. Follow-up CT reviewed, showing left SDH. Per report, recurrent and enlarging left side subdural hematoma. Underwent  left bur hole for evacuation of hematoma 02/20/2018 per Dr. Arnoldo Morale.  Decadron protocol as directed.Maintained on fluid restriction for hyponatremia with latest sodium 125.  No current plan for Declomycin at this time.Monitoring for any signs of fluid overload. His Lasix has currently been placed on hold.In regards to his atrial fibrillation,not a candidate for anticoagulation due to subdural hematoma. Patient remains on [place] for seizure prophylaxis.  Patient is tolerating a regular diet.  Past Medical History  Past Medical History:  Diagnosis Date  . Atrial fibrillation, chronic (HCC)    Not on anticoagulation due to history of bilateral subdural bleeds due to recurrent falls  . BPH (benign prostatic hypertrophy)   . [name] (coronary artery disease), native coronary artery    Severe two-vessel [name] with chronically occluded LAD and RCA with widely patent left main, intermediate, and left circumflex branches.  On medical management.  He has extensive collaterals.  . 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 fib with RVR  . [name] (degenerative joint disease)   . GERD (gastroesophageal reflux disease)   . Hyperlipidemia  LDL goal less then 100  . Hypertension   . Microscopic hematuria    [name]  . Mitral valve prolapse    with moderate MR  . Myocardial infarction [facility])    "was told 11/2017 that he'd had a heart attack; don't know when it was" (02/19/2018)  . OSA (obstructive sleep apnea)    intolerant with CPAP (02/19/2018)  . SDH (subdural hematoma) ([place]) 01/2018  . SIADH (syndrome of inappropriate ADH production) ([place])   . Skin cancer    "side of my nose" (11/19/2017)  . Stroke ([place]) 12/2017   "fully recovered"  . Thrombocytopenia (HCC)    Mild- platelet count 143,000 on 02/2011, stable 08/2011    Family History  family history  includes [name] in his brother and father; Colon cancer in his brother and sister; Heart attack in his brother and father; Heart failure in his mother.  Prior Rehab/Hospitalizations:  Has the patient had major surgery during 100 days prior to admission? Yes  Current Medications   Current Facility-Administered Medications:  .  acetaminophen (TYLENOL) tablet 650 mg, 650 mg, Oral, Q4H PRN, 650 mg at 02/24/18 1227 **OR** acetaminophen (TYLENOL) suppository 650 mg, 650 mg, Rectal, Q4H PRN, Consuella Lose, MD .  atorvastatin (LIPITOR) tablet 20 mg, 20 mg, Oral, QPM, Consuella Lose, MD, 20 mg at 02/23/18 1740 .  Chlorhexidine Gluconate Cloth 2 % PADS 6 each, 6 each, Topical, Q0600, Consuella Lose, MD, Stopped at 02/23/18 0403 .  dexamethasone (DECADRON) injection 4 mg, 4 mg, Intravenous, Q8H, Susa Raring, RPH, 4 mg at 02/24/18 1524 .  digoxin (LANOXIN) tablet 0.125 mg, 0.125 mg, Oral, Daily, Consuella Lose, MD, 0.125 mg at 02/24/18 0949 .  docusate sodium (COLACE) capsule 100 mg, 100 mg, Oral, BID, Consuella Lose, MD, 100 mg at 02/24/18 0949 .  feeding supplement (ENSURE ENLIVE) (ENSURE ENLIVE) liquid 237 mL, 237 mL, Oral, BID BM, Consuella Lose, MD, 237 mL at 02/24/18 0949 .  finasteride (PROSCAR) tablet 5 mg, 5 mg, Oral, Daily, Consuella Lose, MD, 5 mg at 02/24/18 0949 .  fluticasone (FLONASE) 50 MCG/ACT nasal spray 1 spray, 1 spray, Each Nare, Daily, Consuella Lose, MD, 1 spray at 02/24/18 0950 .  HYDROcodone-acetaminophen (NORCO/VICODIN) 5-325 MG per tablet 1 tablet, 1 tablet, Oral, Q4H PRN, Consuella Lose, MD, 1 tablet at 02/23/18 1533 .  lactulose (CHRONULAC) 10 GM/15ML solution 10 g, 10 g, Oral, BID PRN, Consuella Lose, MD, 10 g at 02/24/18 0949 .  levETIRAcetam (KEPPRA) tablet 500 mg, 500 mg, Oral, BID, Consuella Lose, MD, 500 mg at 02/24/18 0949 .  metoprolol succinate (TOPROL-XL) 24 hr tablet 25 mg, 25 mg, Oral, Daily, Danford, Suann Larry, MD,  25 mg at 02/24/18 0949 .  ondansetron (ZOFRAN) tablet 4 mg, 4 mg, Oral, Q4H PRN **OR** ondansetron (ZOFRAN) injection 4 mg, 4 mg, Intravenous, Q4H PRN, Consuella Lose, MD .  pantoprazole (PROTONIX) EC tablet 40 mg, 40 mg, Oral, QHS, Kary Kos, MD, 40 mg at 02/23/18 2300 .  promethazine (PHENERGAN) tablet 12.5-25 mg, 12.5-25 mg, Oral, Q4H PRN, Consuella Lose, MD  Patients Current Diet: Diet Heart Room service appropriate? Yes; Fluid consistency: Thin; Fluid restriction: 1200 mL Fluid Diet general  Precautions / Restrictions Precautions Precautions: Fall Restrictions Weight Bearing Restrictions: No   Has the patient had 2 or more falls or a fall with injury in the past year?Yes  Prior Activity Level Limited Community (1-2x/wk): gradula decline in function since January due to multiple medical admits Community (5-7x/wk): (was driving ann Independent without  AD before [employer])  Home Assistive Devices / Parchment Devices/Equipment: Chana Bode (specify type), Grab bars in shower, Shower chair with back Home Equipment: Walker - standard, Bedside commode, Grab bars - tub/shower, Shower seat  Prior Device Use: Indicate devices/aids used by the patient prior to current illness, exacerbation or injury? None of the above  Prior Functional Level Prior Function Level of Independence: Needs assistance Gait / Transfers Assistance Needed: Since recent admission, pt was been walking with and without AD. ADL's / Homemaking Assistance Needed: Wife having to assist with LB ADLs due to weakness Comments: Pt reporting recent falls at home  Self Care: Did the patient need help bathing, dressing, using the toilet or eating?  Independent prior to recent decline in function  Indoor Mobility: Did the patient need assistance with walking from room to room (with or without device)? Independent  Stairs: Did the patient need assistance with internal or external stairs  (with or without device)? Independent  Functional Cognition: Did the patient need help planning regular tasks such as shopping or remembering to take medications? Independent  Current Functional Level Cognition  Overall Cognitive Status: Impaired/Different from baseline Orientation Level: Oriented X4 Following Commands: Follows one step commands with increased time, Follows multi-step commands inconsistently, Follows multi-step commands with increased time General Comments: required mod cues to utilize aid even after demonstration - difficulty problem solving through errors     Extremity Assessment (includes Sensation/Coordination)  Upper Extremity Assessment: Defer to OT evaluation RUE Deficits / Details: WFL strength. Noting that RUE weaker than LUE RUE Coordination: decreased fine motor, decreased gross motor  Lower Extremity Assessment: Generalized weakness(grossly symmetrical)    ADLs  Overall ADL's : Needs assistance/impaired Eating/Feeding: Set up, Sitting Grooming: Maximal assistance, Standing Grooming Details (indicate cue type and reason): Pt performing oral care at sink and presents with decreased balance and standing tolerance. Pt requiring Max A for maitnaining upright postioning. Pt with lright lateral lean and flexion of bilateral knees.  Upper Body Bathing: Minimal assistance, Sitting Lower Body Bathing: Moderate assistance, Sit to/from stand Upper Body Dressing : Minimal assistance, Sitting Upper Body Dressing Details (indicate cue type and reason): donned gown Lower Body Dressing: Minimal assistance, Sit to/from stand Lower Body Dressing Details (indicate cue type and reason): Pt able to lean forwaed to don/doff socks this date, but reports he often has difficulty accessing feet due to back pain.  He was instructed in use and acquisition of sock aid, but requires mod A and mod verbal cues for use  Functional mobility during ADLs: Minimal assistance, Moderate assistance,  Rolling walker General ADL Comments: Pt demonstrating decreased balance and activity tolerance.     Mobility  Overal bed mobility: Needs Assistance Bed Mobility: Supine to Sit, Sit to Supine Supine to sit: Min guard Sit to supine: Min guard General bed mobility comments: pt in recliner upon PT arrival and returned to recliner at end of session    Transfers  Overall transfer level: Needs assistance Equipment used: Rolling walker (2 wheeled) Transfers: Sit to/from Stand Sit to Stand: Supervision General transfer comment: Pt deferred due to fatigue     Ambulation / Gait / Stairs / Wheelchair Mobility  Ambulation/Gait Ambulation/Gait assistance: Physicist, medical (Feet): 200 Feet(100' x2) Assistive device: Rolling walker (2 wheeled) Gait Pattern/deviations: Step-through pattern, Decreased weight shift to left General Gait Details: pt experiences knee weakness L>R with increasing distance so ambulated 100' and then had him take seated rest break before knee flexion became profound  and then ambulated 100' again. Discussed possibility of rollator.  Gait velocity: decreased Gait velocity interpretation: <1.8 ft/sec, indicate of risk for recurrent falls    Posture / Balance Dynamic Sitting Balance Sitting balance - Comments: donning socks Balance Overall balance assessment: Needs assistance Sitting-balance support: Feet supported, No upper extremity supported Sitting balance-Leahy Scale: Good Sitting balance - Comments: donning socks Standing balance support: During functional activity, Bilateral upper extremity supported Standing balance-Leahy Scale: Poor Standing balance comment: Reliant on UE support    Special needs/care consideration BiPAP/CPAP was scheduled for a home sleep study but son will reschedule CPM  N/a Continuous Drip IV  N/a Dialysis  N/a Life Vest  N/a Oxygen  N/a Special Bed n/a Trach Size  N/a Wound Vac n/a Skin surgical incision Bowel mgmt: LBM  4/13 continent Bladder mgmt: external catheter Diabetic mgmt  N/a   Previous Home Environment Living Arrangements: Spouse/significant other  Lives With: Spouse Available Help at Discharge: Family, Available 24 hours/day Type of Home: House Home Layout: One level Home Access: Stairs to enter Entrance Stairs-Rails: Right Entrance Stairs-Number of Steps: 3 + 3 Bathroom Shower/Tub: Gaffer, Architectural technologist: Standard Bathroom Accessibility: Yes How Accessible: Accessible via walker Home Care Services: Yes Type of Home Care Services: Home PT, Home RN [place] (if known): [facility] Additional Comments: recent admits each month since January  Discharge Living Setting Plans for Discharge Living Setting: Patient's home, Lives with (comment)(spouse) Type of Home at Discharge: House Discharge Home Layout: One level Discharge Home Access: Stairs to enter Entrance Stairs-Rails: Right Entrance Stairs-Number of Steps: 3 plus 3 Discharge Bathroom Shower/Tub: Walk-in shower, Curtain Discharge Bathroom Toilet: Standard Discharge Bathroom Accessibility: Yes How Accessible: Accessible via walker Does the patient have any problems obtaining your medications?: No  Social/Family/Support Systems Patient Roles: Spouse, Parent Contact Information: [place], wife Anticipated Caregiver: wife Anticipated Ambulance person Information: see above Ability/Limitations of Caregiver: supervision level; some light min assist with adls Caregiver Availability: 24/7 Discharge Plan Discussed with Primary Caregiver: Yes Is Caregiver In Agreement with Plan?: Yes Does Caregiver/Family have Issues with Lodging/Transportation while Pt is in Rehab?: No  Goals/Additional Needs Patient/Family Goal for Rehab: Mod I to supervision with PT, OT and SLP Expected length of stay: [facility] 7 to 10 days Pt/Family Agrees to Admission and willing to participate: Yes Program Orientation Provided &  Reviewed with Pt/Caregiver Including Roles  & Responsibilities: Yes  Decrease burden of Care through IP rehab admission: n/a  Possible need for SNF placement upon discharge:not anticipated  Patient Condition: This patient's medical and functional status has changed since the consult dated 02/22/2018 in which the Rehabilitation Physician determined and documented that the patient was potentially appropriate for intensive rehabilitative care in an inpatient rehabilitation facility. Issues have been addressed and update has been discussed with Dr. Naaman Plummer and patient now appropriate for inpatient rehabilitation. Will admit to inpatient rehab today.   Preadmission Screen Completed By:  Cleatrice Burke, 02/24/2018 3:32 PM ______________________________________________________________________   Discussed status with Dr. Naaman Plummer on 02/24/2018 at  1540 and received telephone approval for admission today.  Admission Coordinator:  Cleatrice Burke, time 1191 Date 02/24/2018

## 2018-02-24 NOTE — Progress Notes (Signed)
[name], RN  Rehab Admission Coordinator  Physical Medicine and Rehabilitation  PMR Pre-admission  Signed  Date of Service:  02/24/2018 3:31 PM       Related encounter: Admission (Discharged) from 02/19/2018 in [facility]           [] Hide copied text  [] Hover for details   PMR Admission Coordinator Pre-Admission Assessment  Patient: Marquize Cornelio is an 84 y.o., male MRN: [id number] DOB: 09-10-1929 Height: 5\' 10"  (177.8 cm) Weight: 81.1 kg (178 lb 12.7 oz)                                                                                                                                                  Insurance Information HMO:     PPO: yes     PCP:      IPA:      80/20:      OTHER: Medicare advantage plan PRIMARY: [place] [employer]      Policy#: [id number]      Subscriber: pt CM Name: Orvan July      Phone#: [phone]     Fax#: [phone] Pre-Cert#: [id number] approved for 7 days. F/u Dorthula Nettles phone [phone] fax [phone]      Employer: retired Benefits:  Phone #: [phone]     Name: 4/16 Eff. Date: 11/11/2017     Deduct: none      Out of Pocket Max: $4000      Life Max: none CIR: $160 co pay per day days 1 until 10      SNF: no co pay days 1 to 20; $50 co pay per day days 21-100 Outpatient: $20 co pay per visit     Co-Pay: visits per medical neccesity Home Health: 100%      Co-Pay: visits per medical neccesity DME: 80%     Co-Pay: 20% Providers: in network  SECONDARY: none      Medicaid Application Date:       Case Manager:  Disability Application Date:       Case Worker:   Emergency Publishing copy Information    Name Relation Home Work Mobile   Brice Spouse [phone]  [phone]   Jamen, [place]   [phone]     Current Medical History  Patient Admitting Diagnosis: SDH  History of Present Illness: Thompsonis a 84 y.o.right-handed  malewith history of atrial fibrillation, [name], chronic systolic congestive heart failure.Patient with recent burr hole craniectomy left side chronic subdural hematoma 01/25/2018 and was later discharged home doing well. Presented 02/19/2018 with recent falls and progressive neurologic decline. Noted some elevated blood pressures per home health aide. Noted hyponatremia 123. Follow-up CT reviewed, showing left SDH. Per report, recurrent and enlarging left side subdural hematoma. Underwent left  bur hole for evacuation of hematoma 02/20/2018 per Dr. Arnoldo Morale.Decadron protocol as directed.Maintained on fluid restriction for hyponatremiawith latest sodium 125.No current plan for Declomycin at this time.Monitoring for any signs of fluid overload. His Lasixhas currently been placed on hold.In regards to his atrial fibrillation,not a candidate for anticoagulation due to subdural hematoma. Patient remains on [place] for seizure prophylaxis.Patient is tolerating a regular diet.  Past Medical History      Past Medical History:  Diagnosis Date  . Atrial fibrillation, chronic (HCC)    Not on anticoagulation due to history of bilateral subdural bleeds due to recurrent falls  . BPH (benign prostatic hypertrophy)   . [name] (coronary artery disease), native coronary artery    Severe two-vessel [name] with chronically occluded LAD and RCA with widely patent left main, intermediate, and left circumflex branches.  On medical management.  He has extensive collaterals.  . 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 fib with RVR  . [name] (degenerative joint disease)   . GERD (gastroesophageal reflux disease)   . Hyperlipidemia    LDL goal less then 100  . Hypertension   . Microscopic hematuria    [name]  . Mitral valve prolapse    with  moderate MR  . Myocardial infarction [facility])    "was told 11/2017 that he'd had a heart attack; don't know when it was" (02/19/2018)  . OSA (obstructive sleep apnea)    intolerant with CPAP (02/19/2018)  . SDH (subdural hematoma) ([place]) 01/2018  . SIADH (syndrome of inappropriate ADH production) ([place])   . Skin cancer    "side of my nose" (11/19/2017)  . Stroke ([place]) 12/2017   "fully recovered"  . Thrombocytopenia (HCC)    Mild- platelet count 143,000 on 02/2011, stable 08/2011    Family History  family history includes [name] in his brother and father; Colon cancer in his brother and sister; Heart attack in his brother and father; Heart failure in his mother.  Prior Rehab/Hospitalizations:  Has the patient had major surgery during 100 days prior to admission? Yes  Current Medications   Current Facility-Administered Medications:  .  acetaminophen (TYLENOL) tablet 650 mg, 650 mg, Oral, Q4H PRN, 650 mg at 02/24/18 1227 **OR** acetaminophen (TYLENOL) suppository 650 mg, 650 mg, Rectal, Q4H PRN, Consuella Lose, MD .  atorvastatin (LIPITOR) tablet 20 mg, 20 mg, Oral, QPM, Consuella Lose, MD, 20 mg at 02/23/18 1740 .  Chlorhexidine Gluconate Cloth 2 % PADS 6 each, 6 each, Topical, Q0600, Consuella Lose, MD, Stopped at 02/23/18 0403 .  dexamethasone (DECADRON) injection 4 mg, 4 mg, Intravenous, Q8H, Susa Raring, RPH, 4 mg at 02/24/18 1524 .  digoxin (LANOXIN) tablet 0.125 mg, 0.125 mg, Oral, Daily, Consuella Lose, MD, 0.125 mg at 02/24/18 0949 .  docusate sodium (COLACE) capsule 100 mg, 100 mg, Oral, BID, Consuella Lose, MD, 100 mg at 02/24/18 0949 .  feeding supplement (ENSURE ENLIVE) (ENSURE ENLIVE) liquid 237 mL, 237 mL, Oral, BID BM, Consuella Lose, MD, 237 mL at 02/24/18 0949 .  finasteride (PROSCAR) tablet 5 mg, 5 mg, Oral, Daily, Consuella Lose, MD, 5 mg at 02/24/18 0949 .  fluticasone (FLONASE) 50 MCG/ACT nasal spray 1 spray, 1 spray, Each Nare,  Daily, Consuella Lose, MD, 1 spray at 02/24/18 0950 .  HYDROcodone-acetaminophen (NORCO/VICODIN) 5-325 MG per tablet 1 tablet, 1 tablet, Oral, Q4H PRN,  Consuella Lose, MD, 1 tablet at 02/23/18 1533 .  lactulose (CHRONULAC) 10 GM/15ML solution 10 g, 10 g, Oral, BID PRN, Consuella Lose, MD, 10 g at 02/24/18 0949 .  levETIRAcetam (KEPPRA) tablet 500 mg, 500 mg, Oral, BID, Consuella Lose, MD, 500 mg at 02/24/18 0949 .  metoprolol succinate (TOPROL-XL) 24 hr tablet 25 mg, 25 mg, Oral, Daily, [place], Suann Larry, MD, 25 mg at 02/24/18 0949 .  ondansetron (ZOFRAN) tablet 4 mg, 4 mg, Oral, Q4H PRN **OR** ondansetron (ZOFRAN) injection 4 mg, 4 mg, Intravenous, Q4H PRN, Consuella Lose, MD .  pantoprazole (PROTONIX) EC tablet 40 mg, 40 mg, Oral, QHS, Kary Kos, MD, 40 mg at 02/23/18 2300 .  promethazine (PHENERGAN) tablet 12.5-25 mg, 12.5-25 mg, Oral, Q4H PRN, Consuella Lose, MD  Patients Current Diet: Diet Heart Room service appropriate? Yes; Fluid consistency: Thin; Fluid restriction: 1200 mL Fluid Diet general  Precautions / Restrictions Precautions Precautions: Fall Restrictions Weight Bearing Restrictions: No   Has the patient had 2 or more falls or a fall with injury in the past year?Yes  Prior Activity Level Limited Community (1-2x/wk): gradula decline in function since January due to multiple medical admits Community (5-7x/wk): (was driving ann Independent without AD before Januar; [place])  Development worker, international aid / [place] Devices/Equipment: Wende Mott, Environmental consultant (specify type), Grab bars in shower, Shower chair with back Home Equipment: Walker - standard, Bedside commode, Grab bars - tub/shower, Shower seat  Prior Device Use: Indicate devices/aids used by the patient prior to current illness, exacerbation or injury? None of the above  Prior Functional Level Prior Function Level of Independence: Needs assistance Gait / Transfers  Assistance Needed: Since recent admission, pt was been walking with and without AD. ADL's / Homemaking Assistance Needed: Wife having to assist with LB ADLs due to weakness Comments: Pt reporting recent falls at home  Self Care: Did the patient need help bathing, dressing, using the toilet or eating?  Independent prior to recent decline in function  Indoor Mobility: Did the patient need assistance with walking from room to room (with or without device)? Independent  Stairs: Did the patient need assistance with internal or external stairs (with or without device)? Independent  Functional Cognition: Did the patient need help planning regular tasks such as shopping or remembering to take medications? Independent  Current Functional Level Cognition  Overall Cognitive Status: Impaired/Different from baseline Orientation Level: Oriented X4 Following Commands: Follows one step commands with increased time, Follows multi-step commands inconsistently, Follows multi-step commands with increased time General Comments: required mod cues to utilize aid even after demonstration - difficulty problem solving through errors     Extremity Assessment (includes Sensation/Coordination)  Upper Extremity Assessment: Defer to OT evaluation RUE Deficits / Details: WFL strength. Noting that RUE weaker than LUE RUE Coordination: decreased fine motor, decreased gross motor  Lower Extremity Assessment: Generalized weakness(grossly symmetrical)    ADLs  Overall ADL's : Needs assistance/impaired Eating/Feeding: Set up, Sitting Grooming: Maximal assistance, Standing Grooming Details (indicate cue type and reason): Pt performing oral care at sink and presents with decreased balance and standing tolerance. Pt requiring Max A for maitnaining upright postioning. Pt with lright lateral lean and flexion of bilateral knees.  Upper Body Bathing: Minimal assistance, Sitting Lower Body Bathing: Moderate assistance,  Sit to/from stand Upper Body Dressing : Minimal assistance, Sitting Upper Body Dressing Details (indicate cue type and reason): donned gown Lower Body Dressing: Minimal assistance, Sit to/from stand Lower Body Dressing Details (indicate cue type and reason):  Pt able to lean forwaed to don/doff socks this date, but reports he often has difficulty accessing feet due to back pain.  He was instructed in use and acquisition of sock aid, but requires mod A and mod verbal cues for use  Functional mobility during ADLs: Minimal assistance, Moderate assistance, Rolling walker General ADL Comments: Pt demonstrating decreased balance and activity tolerance.     Mobility  Overal bed mobility: Needs Assistance Bed Mobility: Supine to Sit, Sit to Supine Supine to sit: Min guard Sit to supine: Min guard General bed mobility comments: pt in recliner upon PT arrival and returned to recliner at end of session    Transfers  Overall transfer level: Needs assistance Equipment used: Rolling walker (2 wheeled) Transfers: Sit to/from Stand Sit to Stand: Supervision General transfer comment: Pt deferred due to fatigue     Ambulation / Gait / Stairs / Wheelchair Mobility  Ambulation/Gait Ambulation/Gait assistance: Physicist, medical (Feet): 200 Feet(100' x2) Assistive device: Rolling walker (2 wheeled) Gait Pattern/deviations: Step-through pattern, Decreased weight shift to left General Gait Details: pt experiences knee weakness L>R with increasing distance so ambulated 100' and then had him take seated rest break before knee flexion became profound and then ambulated 100' again. Discussed possibility of rollator.  Gait velocity: decreased Gait velocity interpretation: <1.8 ft/sec, indicate of risk for recurrent falls    Posture / Balance Dynamic Sitting Balance Sitting balance - Comments: donning socks Balance Overall balance assessment: Needs assistance Sitting-balance support: Feet  supported, No upper extremity supported Sitting balance-Leahy Scale: Good Sitting balance - Comments: donning socks Standing balance support: During functional activity, Bilateral upper extremity supported Standing balance-Leahy Scale: Poor Standing balance comment: Reliant on UE support    Special needs/care consideration BiPAP/CPAP was scheduled for a home sleep study but son will reschedule CPM  N/a Continuous Drip IV  N/a Dialysis  N/a Life Vest  N/a Oxygen  N/a Special Bed n/a Trach Size  N/a Wound Vac n/a Skin surgical incision Bowel mgmt: LBM 4/13 continent Bladder mgmt: external catheter Diabetic mgmt  N/a   Previous Home Environment Living Arrangements: Spouse/significant other  Lives With: Spouse Available Help at Discharge: Family, Available 24 hours/day Type of Home: House Home Layout: One level Home Access: Stairs to enter Entrance Stairs-Rails: Right Entrance Stairs-Number of Steps: 3 + 3 Bathroom Shower/Tub: Gaffer, Architectural technologist: Standard Bathroom Accessibility: Yes How Accessible: Accessible via walker Home Care Services: Yes Type of Home Care Services: Home PT, Home RN [place] (if known): [facility] Additional Comments: recent admits each month since January  Discharge Living Setting Plans for Discharge Living Setting: Patient's home, Lives with (comment)(spouse) Type of Home at Discharge: House Discharge Home Layout: One level Discharge Home Access: Stairs to enter Entrance Stairs-Rails: Right Entrance Stairs-Number of Steps: 3 plus 3 Discharge Bathroom Shower/Tub: Walk-in shower, Curtain Discharge Bathroom Toilet: Standard Discharge Bathroom Accessibility: Yes How Accessible: Accessible via walker Does the patient have any problems obtaining your medications?: No  Social/Family/Support Systems Patient Roles: Spouse, Parent Contact Information: Enid Derry, wife Anticipated Caregiver: wife Anticipated  Ambulance person Information: see above Ability/Limitations of Caregiver: supervision level; some light min assist with adls Caregiver Availability: 24/7 Discharge Plan Discussed with Primary Caregiver: Yes Is Caregiver In Agreement with Plan?: Yes Does Caregiver/Family have Issues with Lodging/Transportation while Pt is in Rehab?: No  Goals/Additional Needs Patient/Family Goal for Rehab: Mod I to supervision with PT, OT and SLP Expected length of stay: ELOS 7 to 10 days  Pt/Family Agrees to Admission and willing to participate: Yes Program Orientation Provided & Reviewed with Pt/Caregiver Including Roles  & Responsibilities: Yes  Decrease burden of Care through IP rehab admission: n/a  Possible need for SNF placement upon discharge:not anticipated  Patient Condition: This patient's medical and functional status has changed since the consult dated 02/22/2018 in which the Rehabilitation Physician determined and documented that the patient was potentially appropriate for intensive rehabilitative care in an inpatient rehabilitation facility. Issues have been addressed and update has been discussed with Dr. Naaman Plummer and patient now appropriate for inpatient rehabilitation. Will admit to inpatient rehab today.   Preadmission Screen Completed By:  Cleatrice Burke, 02/24/2018 3:32 PM ______________________________________________________________________   Discussed status with Dr. Naaman Plummer on 02/24/2018 at  1540 and received telephone approval for admission today.  Admission Coordinator:  Cleatrice Burke, time 3500 Date 02/24/2018             Cosigned by: Meredith Staggers, MD at 02/24/2018 4:00 PM  Revision History

## 2018-02-24 NOTE — Care Management Note (Signed)
Case Management Note  Patient Details  Name: Ryan Savage MRN: [id number] Date of Birth: 06/22/1929  Subjective/Objective:   Patient was admitted to the hospital on 4/11 for dysarthria and weakness. Found to have worsening SDH, s/p  Burr-hole procedure on 4/12.  PTA, pt required assistance with ADLS; lives at home with spouse.                  Action/Plan: PT/OT recommending CIR stay prior to dc home.  Pt medically stable for discharge today; insurance authorization received for admission to CIR.    Expected Discharge Date:  02/24/18               Expected Discharge Plan:  [place]  In-House Referral:     Discharge planning Services  CM Consult  Post Acute Care Choice:    Choice offered to:     DME Arranged:    DME Agency:     HH Arranged:    HH Agency:     Status of Service:  Completed, signed off  If discussed at [employer], dates discussed:    Additional Comments:  Ella Bodo, RN 02/24/2018, 2:13 PM

## 2018-02-24 NOTE — Discharge Summary (Signed)
Physician Discharge Summary  Patient ID: Ryan Savage MRN: [id number] DOB/AGE: [birth year] 84 y.o.  Admit date: 02/19/2018 Discharge date: 02/24/2018  Admission Diagnoses:  SDH  Discharge Diagnoses:  Same Principal Problem:   SIADH Active Problems:   SDH (subdural hematoma) (HCC)   Chronic systolic CHF (congestive heart failure), NYHA class 2 (HCC)   Hypertension   Chronic atrial fibrillation (HCC)   Coronary artery disease   Mild mitral valve regurgitation   Subdural hematoma (HCC)   Acute blood loss anemia   Steroid-induced hyperglycemia   Discharged Condition: Stable  Hospital Course:  Ryan Savage is a 84 y.o. male who was admitted for the below procedure after outpt CT scan showed worsening SDH. There were no post operative complications with the exception of chronic hyponatremia - TH consulted. Appreciate assistance. At time of discharge, pain was well controlled, ambulating with Pt/OT, tolerating po, voiding normal. Ready for discharge to CIR. He will need to continue Keppra for 2-3 months. Complete medrol dose pack. Staple removal in 6-10 days.   Treatments: Surgery - Left bur holes for evacuation of subdural hematoma   Discharge Exam: Blood pressure (!) 147/74, pulse 82, temperature 97.8 F (36.6 C), temperature source Oral, resp. rate 14, height 5\' 10"  (1.778 m), weight 81.1 kg (178 lb 12.7 oz), SpO2 100 %. Awake, alert, oriented Speech fluent, appropriate CN grossly intact 5/5 BUE/BLE Wound c/d/i  Disposition: Discharge disposition: [place] Not Defined       Discharge Instructions    Call MD for:  difficulty breathing, headache or visual disturbances   Complete by:  As directed    Call MD for:  persistant dizziness or light-headedness   Complete by:  As directed    Call MD for:  redness, tenderness, or signs of infection (pain, swelling, redness, odor or green/yellow discharge around incision site)   Complete by:  As  directed    Call MD for:  severe uncontrolled pain   Complete by:  As directed    Call MD for:  temperature >100.4   Complete by:  As directed    Diet general   Complete by:  As directed    Driving Restrictions   Complete by:  As directed    Do not drive until given clearance.   Increase activity slowly   Complete by:  As directed    Lifting restrictions   Complete by:  As directed    Do not lift anything >10lbs. Avoid bending and twisting in awkward positions. Avoid bending at the back.   May shower / Bathe   Complete by:  As directed    In 24 hours. Okay to wash wound with warm soapy water. Avoid scrubbing the wound. Pat dry.     Allergies as of 02/24/2018      Reactions   Novocain [procaine] Rash      Medication List    TAKE these medications   acetaminophen 500 MG tablet Commonly known as:  TYLENOL Take 1,000 mg by mouth as needed for mild pain.   atorvastatin 20 MG tablet Commonly known as:  LIPITOR Take 20 mg by mouth every evening.   digoxin 0.125 MG tablet Commonly known as:  LANOXIN Take 1 tablet (0.125 mg total) by mouth daily.   docusate sodium 100 MG capsule Commonly known as:  COLACE Take 1 capsule (100 mg total) by mouth 2 (two) times daily.   finasteride 5 MG tablet Commonly known as:  PROSCAR Take 5 mg by mouth daily.  fluticasone 50 MCG/ACT nasal spray Commonly known as:  FLONASE Place 1 spray into both nostrils daily.   furosemide 20 MG tablet Commonly known as:  LASIX Take 1 tablet (20 mg total) by mouth daily.   HYDROcodone-acetaminophen 5-325 MG tablet Commonly known as:  NORCO/VICODIN Take 1-2 tablets by mouth every 4 (four) hours as needed.   lactulose 10 GM/15ML solution Commonly known as:  CHRONULAC Take 15 mLs (10 g total) by mouth 2 (two) times daily as needed for mild constipation.   levETIRAcetam 500 MG tablet Commonly known as:  KEPPRA Take 1 tablet (500 mg total) by mouth 2 (two) times daily.   methylPREDNISolone 4 MG  Tbpk tablet Commonly known as:  MEDROL DOSEPAK Take according to package insert   metoprolol succinate 25 MG 24 hr tablet Commonly known as:  TOPROL-XL Take 25 mg by mouth 2 (two) times daily.   neomycin-bacitracin-polymyxin ointment Commonly known as:  NEOSPORIN Apply 1 application topically every 12 (twelve) hours.   pantoprazole 40 MG tablet Commonly known as:  PROTONIX Take 40 mg by mouth every evening.   sacubitril-valsartan 24-26 MG Commonly known as:  ENTRESTO Take 1 tablet by mouth 2 (two) times daily.      Follow-up Information    Kary Kos, MD. Schedule an appointment as soon as possible for a visit in 6 day(s).   Specialty:  Neurosurgery Why:  Staple removal Contact information: [address] [place] [address] [place] [address] [phone]           Signed: Traci Sermon 02/24/2018, 12:06 PM

## 2018-02-24 NOTE — Progress Notes (Signed)
I have insurance approval to admit pt to inpt rehab today. I met with pt, his wife and eldest son at bedside and they are in agreement to admit. I contacted Ferne Reus, PA and we will make the arrangements tot admit today. RN CM and SW made aware. [phone]

## 2018-02-24 NOTE — Progress Notes (Signed)
Gave report to nurse receiving pt in CIR. Nurse requested to leave IV in pt.

## 2018-02-24 NOTE — Progress Notes (Signed)
Physical Therapy Treatment Patient Details Name: Ryan Savage MRN: [id number] DOB: 1929-03-14 Today's Date: 02/24/2018    History of Present Illness Ryan Savage is a 84 y.o. male with medical history significant for ischemic cardiomyopathy with EF 30-35%, mild mitral valve prolapse confirmed by cardiac catheterization, chronic atrial fibrillation no longer on anticoagulation secondary to subdural hematoma status post burr holes on 3/17, hypertension, [name] tonically occluded LAD and RCA with extensive collaterals, dyslipidemia, BPH, [name], GERD and SIADH.  Patient was admitted to the hospital on 4/11 for dysarthria and weakness. Found to have worsening SDH, now s/p another Burr-hole procedure on 4/12    PT Comments    Pt ambulated 100' at at time and took seated rest break just as L knee became weak with increased flexion. Bilateral LE weakness increasing fall risk currently. Worked on standing LE exercises with RW including heel raises, marching, and mini squats. Continue to recommend CIR before home. PT will continue to follow.    Follow Up Recommendations  CIR     Equipment Recommendations  Other (comment)(possibly rollator, TBD)    Recommendations for Other Services Rehab consult     Precautions / Restrictions Precautions Precautions: Fall Restrictions Weight Bearing Restrictions: No    Mobility  Bed Mobility               General bed mobility comments: pt in recliner upon PT arrival and returned to recliner at end of session  Transfers Overall transfer level: Needs assistance Equipment used: Rolling walker (2 wheeled) Transfers: Sit to/from Stand Sit to Stand: Supervision         General transfer comment: vc's for hand placement to which pt responds, "I should have remembered that". Supervision for safety from recliner and foot of bed  Ambulation/Gait Ambulation/Gait assistance: Min guard Ambulation Distance (Feet): 200 Feet(100' x2) Assistive device:  Rolling walker (2 wheeled) Gait Pattern/deviations: Step-through pattern;Decreased weight shift to left Gait velocity: decreased Gait velocity interpretation: <1.8 ft/sec, indicate of risk for recurrent falls General Gait Details: pt experiences knee weakness L>R with increasing distance so ambulated 100' and then had him take seated rest break before knee flexion became profound and then ambulated 100' again. Discussed possibility of rollator.    Stairs             Wheelchair Mobility    Modified Rankin (Stroke Patients Only) Modified Rankin (Stroke Patients Only) Pre-Morbid Rankin Score: Moderate disability Modified Rankin: Moderately severe disability     Balance Overall balance assessment: Needs assistance Sitting-balance support: Feet supported;No upper extremity supported Sitting balance-Leahy Scale: Good     Standing balance support: During functional activity;Bilateral upper extremity supported Standing balance-Leahy Scale: Poor Standing balance comment: Reliant on UE support                            Cognition Arousal/Alertness: Awake/alert Behavior During Therapy: WFL for tasks assessed/performed Overall Cognitive Status: Impaired/Different from baseline Area of Impairment: Problem solving;Following commands                     Memory: Decreased short-term memory Following Commands: Follows one step commands with increased time;Follows multi-step commands inconsistently;Follows multi-step commands with increased time     Problem Solving: Slow processing;Requires verbal cues;Requires tactile cues General Comments: pt with increased response time to questions and continues to have difficulty with multitasking. However, pt known to me from last admission and cognition much better after this procedure than last time  when he became significantly more aphasic while ambulating.       Exercises General Exercises - Lower Extremity Hip  Flexion/Marching: Standing;Both;10 reps Heel Raises: AROM;15 reps;Both;Standing Mini-Sqauts: NOBS;96 reps;Standing    General Comments        Pertinent Vitals/Pain Pain Assessment: Faces Faces Pain Scale: Hurts little more Pain Location: Head/Skull Pain Descriptors / Indicators: Aching;Constant;Discomfort Pain Intervention(s): Limited activity within patient's tolerance;Monitored during session    Home Living                      Prior Function            PT Goals (current goals can now be found in the care plan section) Acute Rehab PT Goals Patient Stated Goal: to go home and decrease fall risk PT Goal Formulation: With patient Time For Goal Achievement: 03/08/18 Potential to Achieve Goals: Good Progress towards PT goals: Progressing toward goals    Frequency    Min 4X/week      PT Plan Current plan remains appropriate    Co-evaluation              AM-PAC PT "6 Clicks" Daily Activity  Outcome Measure  Difficulty turning over in bed (including adjusting bedclothes, sheets and blankets)?: A Little Difficulty moving from lying on back to sitting on the side of the bed? : A Little Difficulty sitting down on and standing up from a chair with arms (e.g., wheelchair, bedside commode, etc,.)?: A Little Help needed moving to and from a bed to chair (including a wheelchair)?: A Little Help needed walking in hospital room?: A Little Help needed climbing 3-5 steps with a railing? : A Lot 6 Click Score: 17    End of Session Equipment Utilized During Treatment: Gait belt Activity Tolerance: Patient tolerated treatment well Patient left: in chair;with call bell/phone within reach Nurse Communication: Mobility status PT Visit Diagnosis: Unsteadiness on feet (R26.81);Repeated falls (R29.6)     Time: 2836-6294 PT Time Calculation (min) (ACUTE ONLY): 30 min  Charges:  $Gait Training: 23-37 mins                    G Codes:       [place]  [place] 02/24/2018, 10:46 AM

## 2018-02-24 NOTE — H&P (Signed)
Physical Medicine and Rehabilitation Admission H&P    Chief complaint: Headache  HPI: Ryan Savage is a 84 y.o. right-handed male with history of atrial fibrillation, [name], chronic systolic congestive heart failure.  History taken from chart review and patient. Patient with recent burr hole craniectomy left side chronic subdural hematoma 01/25/2018 and was later discharged home doing well.  He lives with spouse.  Ambulated without assistive device.  Wife assisted with some lower body ADLs.  Presented 02/19/2018 with recent falls and progressive neurologic decline.  Noted some elevated blood pressures per home health aide.  Noted hyponatremia 123.  Follow-up CT reviewed, showing left SDH.  Per report, recurrent and enlarging left side subdural hematoma.  Underwent left bur hole for evacuation of hematoma 02/20/2018 per Dr. Arnoldo Morale.  Decadron protocol as directed. Maintained on fluid restriction for hyponatremia with latest sodium 125.  No current plan for Declomycin at this time. Monitoring for any signs of fluid overload.  His Lasix has currently been placed on hold. In regards to his atrial fibrillation, not a candidate for anticoagulation due to subdural hematoma.  Patient remains on [place] for seizure prophylaxis.  Patient is tolerating a regular diet. Physical and occupational therapy evaluations completed with recommendations of physical medicine rehab consult .  Patient was admitted for a comprehensive rehab program   Review of Systems  Constitutional: Positive for malaise/fatigue. Negative for chills and fever.  HENT: Negative for hearing loss.   Eyes: Negative for blurred vision and double vision.  Cardiovascular: Positive for palpitations and leg swelling. Negative for chest pain.  Gastrointestinal: Positive for constipation. Negative for nausea and vomiting.       GERD  Genitourinary: Negative for dysuria, flank pain and hematuria.  Musculoskeletal: Positive for falls and joint pain.    Skin: Negative for rash.  All other systems reviewed and are negative.  Past Medical History:  Diagnosis Date  . Atrial fibrillation, chronic (HCC)    Not on anticoagulation due to history of bilateral subdural bleeds due to recurrent falls  . BPH (benign prostatic hypertrophy)   . [name] (coronary artery disease), native coronary artery    Severe two-vessel [name] with chronically occluded LAD and RCA with widely patent left main, intermediate, and left circumflex branches.  On medical management.  He has extensive collaterals.  . 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 fib with RVR  . [name] (degenerative joint disease)   . GERD (gastroesophageal reflux disease)   . Hyperlipidemia    LDL goal less then 100  . Hypertension   . Microscopic hematuria    [name]  . Mitral valve prolapse    with moderate MR  . Myocardial infarction [facility])    "was told 11/2017 that he'd had a heart attack; don't know when it was" (02/19/2018)  . OSA (obstructive sleep apnea)    intolerant with CPAP (02/19/2018)  . SDH (subdural hematoma) ([place]) 01/2018  . SIADH (syndrome of inappropriate ADH production) ([place])   . Skin cancer    "side of my nose" (11/19/2017)  . Stroke ([place]) 12/2017   "fully recovered"  . Thrombocytopenia (HCC)    Mild- platelet count 143,000 on 02/2011, stable 08/2011   Past Surgical History:  Procedure Laterality Date  . APPENDECTOMY  1950  . BACK SURGERY    . BURR HOLE Bilateral 01/25/2018  Procedure: Haskell Flirt;  Surgeon: Kary Kos, MD;  Location: [place];  Service: Neurosurgery;  Laterality: Bilateral;  . BURR HOLE Left 02/20/2018   Procedure: BURR HOLES for subdural hematoma;  Surgeon: Newman Pies, MD;  Location: [facility];  Service: Neurosurgery;  Laterality: Left;  . FOREARM FRACTURE SURGERY Right 1954  . LUMBAR DISC SURGERY  1959    ruptured disc repair  . RIGHT/LEFT HEART CATH AND CORONARY ANGIOGRAPHY N/A 11/21/2017   Procedure: RIGHT/LEFT HEART CATH AND CORONARY ANGIOGRAPHY;  Surgeon: Sherren Mocha, MD;  Location: [facility] CV LAB;  Service: Cardiovascular;  Laterality: N/A;  . SKIN CANCER EXCISION Left    "side of my nose"   Family History  Problem Relation Age of Onset  . Heart failure Mother   . Heart attack Father   . [name] Father   . Heart attack Brother   . [name] Brother   . Colon cancer Brother   . Colon cancer Sister    Social History:  reports that he quit smoking about 61 years ago. His smoking use included cigarettes. He quit after 3.00 years of use. He has never used smokeless tobacco. He reports that he does not drink alcohol or use drugs. Allergies:  Allergies  Allergen Reactions  . Novocain [Procaine] Rash   Medications Prior to Admission  Medication Sig Dispense Refill  . acetaminophen (TYLENOL) 500 MG tablet Take 1,000 mg by mouth as needed for mild pain.    Marland Kitchen atorvastatin (LIPITOR) 20 MG tablet Take 20 mg by mouth every evening.    . digoxin (LANOXIN) 0.125 MG tablet Take 1 tablet (0.125 mg total) by mouth daily. 30 tablet 0  . docusate sodium (COLACE) 100 MG capsule Take 1 capsule (100 mg total) by mouth 2 (two) times daily. 10 capsule 0  . finasteride (PROSCAR) 5 MG tablet Take 5 mg by mouth daily.     . fluticasone (FLONASE) 50 MCG/ACT nasal spray Place 1 spray into both nostrils daily.    . furosemide (LASIX) 20 MG tablet Take 1 tablet (20 mg total) by mouth daily. 30 tablet 0  . lactulose (CHRONULAC) 10 GM/15ML solution Take 15 mLs (10 g total) by mouth 2 (two) times daily as needed for mild constipation. 240 mL 0  . metoprolol succinate (TOPROL-XL) 25 MG 24 hr tablet Take 25 mg by mouth 2 (two) times daily.    Marland Kitchen neomycin-bacitracin-polymyxin (NEOSPORIN) ointment Apply 1 application topically every 12 (twelve) hours.    . pantoprazole (PROTONIX) 40 MG tablet Take 40 mg by mouth every  evening.    . sacubitril-valsartan (ENTRESTO) 24-26 MG Take 1 tablet by mouth 2 (two) times daily. 60 tablet 1    Drug Regimen Review Drug regimen was reviewed and remains appropriate with no significant issues identified  Home: Home Living Family/patient expects to be discharged to:: Private residence Living Arrangements: Spouse/significant other Available Help at Discharge: Family, Available 24 hours/day Type of Home: House Home Access: Stairs to enter [employer] of Steps: 3 + 3 Entrance Stairs-Rails: Right Home Layout: One level Bathroom Shower/Tub: Gaffer, Architectural technologist: Standard Home Equipment: Environmental consultant - standard, Bedside commode, Grab bars - tub/shower, Shower seat   Functional History: Prior Function Level of Independence: Needs assistance Gait / Transfers Assistance Needed: Since recent admission, pt was been walking with and without AD. ADL's / Homemaking Assistance Needed: Wife having to assist with LB ADLs due to weakness Comments: Pt reporting recent falls at home  Functional Status:  Mobility: Bed Mobility  Overal bed mobility: Needs Assistance Bed Mobility: Supine to Sit Supine to sit: Min guard, HOB elevated General bed mobility comments: pt in recliner upon PT arrival and returned to recliner at end of session Transfers Overall transfer level: Needs assistance Equipment used: Rolling walker (2 wheeled) Transfers: Sit to/from Stand Sit to Stand: Min assist General transfer comment: min assist for steadying and to stabilize RW. VCs for hand placement Ambulation/Gait Ambulation/Gait assistance: Min assist Ambulation Distance (Feet): 115 Feet Assistive device: Rolling walker (2 wheeled) Gait Pattern/deviations: Step-through pattern, Trunk flexed General Gait Details: pt becomes easily fatigued with significant increase in flexed posture and prominent increase in knee flexion/decreased step length with increasing distance requiring  min assist for steadying.  Gait velocity: decreased Gait velocity interpretation: <1.8 ft/sec, indicate of risk for recurrent falls    ADL: ADL Overall ADL's : Needs assistance/impaired Eating/Feeding: Set up, Sitting Grooming: Maximal assistance, Standing Grooming Details (indicate cue type and reason): Pt performing oral care at sink and presents with decreased balance and standing tolerance. Pt requiring Max A for maitnaining upright postioning. Pt with lright lateral lean and flexion of bilateral knees.  Upper Body Bathing: Minimal assistance, Sitting Lower Body Bathing: Moderate assistance, Sit to/from stand Upper Body Dressing : Minimal assistance, Sitting Upper Body Dressing Details (indicate cue type and reason): donned gown Lower Body Dressing: Moderate assistance, Sit to/from stand Lower Body Dressing Details (indicate cue type and reason): Pt able to adjust socks at EOB by bringing ankles up to EOB. Pt requiring Mod A for standing balance with dyanmic movement Functional mobility during ADLs: Minimal assistance, Moderate assistance, Rolling walker General ADL Comments: Pt demonstrating decreased balance and activity tolerance.   Cognition: Cognition Overall Cognitive Status: Impaired/Different from baseline Orientation Level: Oriented X4 Cognition Arousal/Alertness: Awake/alert Behavior During Therapy: WFL for tasks assessed/performed Overall Cognitive Status: Impaired/Different from baseline Area of Impairment: Problem solving, Following commands Following Commands: Follows one step commands with increased time, Follows multi-step commands inconsistently, Follows multi-step commands with increased time Problem Solving: Slow processing, Requires verbal cues, Requires tactile cues General Comments: pt requiring increased time and cues for command following. demonstrates increased difficulty and instability with dual tasking. pt has to stop walking to respond to  questions  Physical Exam: Blood pressure 121/85, pulse (!) 49, temperature 97.9 F (36.6 C), temperature source Oral, resp. rate 16, height _0  (1.778 m), weight 81.1 kg (178 lb 12.7 oz), SpO2 99 %. Physical Exam  Vitals reviewed. Constitutional: He is oriented to person, place, and time. He appears well-developed. No distress.  HENT:  Craniectomy site intact with staples  Eyes: Right eye exhibits no discharge. Left eye exhibits no discharge.  Pupils reactive to light  Neck: Normal range of motion. Neck supple. No tracheal deviation present. No thyromegaly present.  Cardiovascular: Normal rate and regular rhythm. Exam reveals no gallop and no friction rub.  No murmur heard.    Respiratory: Effort normal and breath sounds normal. No respiratory distress. He has no wheezes. He has no rales.  GI: Soft. Bowel sounds are normal. He exhibits no distension. There is no tenderness. There is no rebound.  Neurological: He is alert and oriented to person, place, and time. No cranial nerve deficit. He exhibits normal muscle tone. Coordination normal.  Normal language. Reasonable insight and awareness. Motor 4/5 UE's. 3+ to 4/5 bilateral LE's prox to distal. No focal sensory findings although patient notes tingling in bilateral toes.   Psychiatric: He has a normal mood and affect. His behavior  is normal. Judgment and thought content normal.  Sl tangential.   Musculoskeletal:  No edema or tenderness in extremities  Skin.  Warm and dry bur hole sites clean and dry Neurological.  Patient is pleasant makes good eye contact with examiner.  He provides his name and age as well as place.  [place].  Followed commands      Results for orders placed or performed during the hospital encounter of 02/19/18 (from the past 48 hour(s))  Basic metabolic panel     Status: Abnormal   Collection Time: 02/23/18  3:51 AM  Result Value Ref Range   Sodium 125 (L) 135 - 145 mmol/L   Potassium 4.3 3.5  - 5.1 mmol/L   Chloride 91 (L) 101 - 111 mmol/L   CO2 28 22 - 32 mmol/L   Glucose, Bld 144 (H) 65 - 99 mg/dL   BUN 15 6 - 20 mg/dL   Creatinine, Ser 0.72 0.61 - 1.24 mg/dL   Calcium 8.1 (L) 8.9 - 10.3 mg/dL   GFR calc non Af Amer >60 >60 mL/min   GFR calc Af Amer >60 >60 mL/min    Comment: (NOTE) The eGFR has been calculated using the CKD EPI equation. This calculation has not been validated in all clinical situations. eGFR's persistently <60 mL/min signify possible Chronic Kidney Disease.    Anion gap 6 5 - 15    Comment: Performed at [facility] [address], [place], [place] [address]  Basic metabolic panel     Status: Abnormal   Collection Time: 02/24/18  3:41 AM  Result Value Ref Range   Sodium 125 (L) 135 - 145 mmol/L   Potassium 4.6 3.5 - 5.1 mmol/L   Chloride 90 (L) 101 - 111 mmol/L   CO2 28 22 - 32 mmol/L   Glucose, Bld 139 (H) 65 - 99 mg/dL   BUN 18 6 - 20 mg/dL   Creatinine, Ser 0.74 0.61 - 1.24 mg/dL   Calcium 8.3 (L) 8.9 - 10.3 mg/dL   GFR calc non Af Amer >60 >60 mL/min   GFR calc Af Amer >60 >60 mL/min    Comment: (NOTE) The eGFR has been calculated using the CKD EPI equation. This calculation has not been validated in all clinical situations. eGFR's persistently <60 mL/min signify possible Chronic Kidney Disease.    Anion gap 7 5 - 15    Comment: Performed at [facility] [address] [place], [place] [address]   No results found.     Medical Problem List and Plan: 1.  Decreased functional mobility secondary to enlarging left subdural hematoma.  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   -admit to inpatient rehab 2.  DVT Prophylaxis/Anticoagulation: SCDs.  Monitor for any signs of DVT 3. Pain Management: Hydrocodone as needed 4. Mood: Provide emotional support 5. Neuropsych: This patient is capable of making decisions  on his own behalf. 6. Skin/Wound Care: Routine skin checks 7. Fluids/Electrolytes/Nutrition: Routine in and outs with follow-up chemistries 8.  Chronic systolic congestive heart failure.  Monitor for any signs of fluid overload 9.  Chronic atrial fibrillation.  Not a candidate for anticoagulation due to SDH.  Continue digoxin 0.125 mg daily as well as Toprol 25 mg daily 10.  Seizure prophylaxis.  Keppra 500 mg twice daily. 11.  Hyponatremia.  Fluid restriction.  Follow-up chemistries on admit. 12.  BPH.  Pro-scar 5 mg daily.  Check PVRs x3 13.  Hyperlipidemia.  Lipitor 14.  Constipation.  Laxative assistance 15.  GERD.  Protonix   Post Admission Physician Evaluation: 1. Functional deficits secondary  to left SDH. 2. Patient is admitted to receive collaborative, interdisciplinary care between the physiatrist, rehab nursing staff, and therapy team. 3. Patient's level of medical complexity and substantial therapy needs in context of that medical necessity cannot be provided at a lesser intensity of care such as a SNF. 4. Patient has experienced substantial functional loss from his/her baseline which was documented above under the "Functional History" and "Functional Status" headings.  Judging by the patient's diagnosis, physical exam, and functional history, the patient has potential for functional progress which will result in measurable gains while on inpatient rehab.  These gains will be of substantial and practical use upon discharge  in facilitating mobility and self-care at the household level. 5. Physiatrist will provide 24 hour management of medical needs as well as oversight of the therapy plan/treatment and provide guidance as appropriate regarding the interaction of the two. 6. The Preadmission Screening has been reviewed and patient status is unchanged unless otherwise stated above. 7. 24 hour rehab nursing will assist with bladder management, bowel management, safety, skin/wound care,  disease management, medication administration, pain management and patient education  and help integrate therapy concepts, techniques,education, etc. 8. PT will assess and treat for/with: Lower extremity strength, range of motion, stamina, balance, functional mobility, safety, adaptive techniques and equipment, NMR, visual-spatial awareness.   Goals are: mod I to supervision. 9. OT will assess and treat for/with: ADL's, functional mobility, safety, upper extremity strength, adaptive techniques and equipment, NMR, family ed, community reentry.   Goals are: mod I to supervision. Therapy may proceed with showering this patient. 10. SLP will assess and treat for/with: cognition, communication, family ed.  Goals are: mod I to supervision. 11. Case Management and Social Worker will assess and treat for psychological issues and discharge planning. 12. Team conference will be held weekly to assess progress toward goals and to determine barriers to discharge. 13. Patient will receive at least 3 hours of therapy per day at least 5 days per week. 14. ELOS: 7-10 days       15. Prognosis:  excellent     I have personally performed a face to face diagnostic evaluation of this patient. Additionally, I have reviewed and concur with the physician assistant's documentation above.  Meredith Staggers, MD, FAAPMR  Lavon Paganini [place], PA-C 02/24/2018

## 2018-02-24 NOTE — Progress Notes (Signed)
Admitted to unit via wheelchair. Oriented to room, rehab schedule and plan of care. Pt. And wife verbalized understanding of information provided. Ellouise Newer

## 2018-02-24 NOTE — Progress Notes (Signed)
Jamse Arn, MD  Physician  Physical Medicine and Rehabilitation  Consult Note  Signed  Date of Service:  02/23/2018 7:44 AM       Related encounter: Admission (Discharged) from 02/19/2018 in [facility] All Collapse All      [] Hide copied text  [] Hover for details        Physical Medicine and Rehabilitation Consult Reason for Consult: Decreased functional mobility Referring Physician: Dr. Saintclair Halsted   HPI: Ryan Savage is a 84 y.o. right-handed male with history of atrial fibrillation, [name], chronic systolic congestive heart failure.  History taken from chart review and patient. Patient with recent burr hole craniectomy left side chronic subdural hematoma 01/25/2018 and was later discharged home doing well.  He lives with spouse.  Ambulated without assistive device.  Wife assisted with some lower body ADLs.  Presented 02/19/2018 with recent falls and progressive neurologic decline.  Noted some elevated blood pressures per home health aide.  Noted hyponatremia 123.  Follow-up CT reviewed, showing left SDH.  Per report, recurrent and enlarging left side subdural hematoma.  Underwent left bur hole for evacuation of hematoma 02/20/2018 per Dr. Arnoldo Morale.  Maintained on fluid restriction for hyponatremia.  Monitoring for any signs of fluid overload.  His Lasix was held for a short time.  In regards to his atrial fibrillation, not a candidate for anticoagulation due to subdural hematoma.  Patient remains on Horatio for seizure prophylaxis.  Physical therapy evaluation completed with recommendations of physical medicine rehab consult.   Review of Systems  Constitutional: Positive for malaise/fatigue. Negative for chills and fever.  HENT: Negative for hearing loss.   Eyes: Negative for blurred vision and double vision.  Respiratory: Negative for cough and shortness of breath.   Cardiovascular: Negative for chest pain.    Gastrointestinal: Positive for constipation. Negative for nausea and vomiting.       GERD  Genitourinary: Negative for dysuria, flank pain and hematuria.  Musculoskeletal: Positive for falls and joint pain.  All other systems reviewed and are negative.      Past Medical History:  Diagnosis Date  . Atrial fibrillation, chronic (HCC)    Not on anticoagulation due to history of bilateral subdural bleeds due to recurrent falls  . BPH (benign prostatic hypertrophy)   . [name] (coronary artery disease), native coronary artery    Severe two-vessel [name] with chronically occluded LAD and RCA with widely patent left main, intermediate, and left circumflex branches.  On medical management.  He has extensive collaterals.  . 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 fib with RVR  . [name] (degenerative joint disease)   . GERD (gastroesophageal reflux disease)   . Hyperlipidemia    LDL goal less then 100  . Hypertension   . Microscopic hematuria    [name]  . Mitral valve prolapse    with moderate MR  . Myocardial infarction [facility])    "was told 11/2017 that he'd had a heart attack; don't know when it was" (02/19/2018)  . OSA (obstructive sleep apnea)    intolerant with CPAP (02/19/2018)  . SDH (subdural hematoma) ([place]) 01/2018  . SIADH (syndrome of inappropriate ADH production) ([place])   . Skin cancer    "side of my nose" (11/19/2017)  . Stroke (  [place]) 12/2017   "fully recovered"  . Thrombocytopenia (HCC)    Mild- platelet count 143,000 on 02/2011, stable 08/2011        Past Surgical History:  Procedure Laterality Date  . APPENDECTOMY  1950  . BACK SURGERY    . BURR HOLE Bilateral 01/25/2018   Procedure: Haskell Flirt;  Surgeon: Kary Kos, MD;  Location: [place];  Service: Neurosurgery;  Laterality: Bilateral;  . BURR  HOLE Left 02/20/2018   Procedure: BURR HOLES for subdural hematoma;  Surgeon: Newman Pies, MD;  Location: [place];  Service: Neurosurgery;  Laterality: Left;  . FOREARM FRACTURE SURGERY Right 1954  . LUMBAR DISC SURGERY  1959   ruptured disc repair  . RIGHT/LEFT HEART CATH AND CORONARY ANGIOGRAPHY N/A 11/21/2017   Procedure: RIGHT/LEFT HEART CATH AND CORONARY ANGIOGRAPHY;  Surgeon: Sherren Mocha, MD;  Location: [place] [facility];  Service: Cardiovascular;  Laterality: N/A;  . SKIN CANCER EXCISION Left    "side of my nose"        Family History  Problem Relation Age of Onset  . Heart failure Mother   . Heart attack Father   . [name] Father   . Heart attack Brother   . [name] Brother   . Colon cancer Brother   . Colon cancer Sister    Social History:  reports that he quit smoking about 61 years ago. His smoking use included cigarettes. He quit after 3.00 years of use. He has never used smokeless tobacco. He reports that he does not drink alcohol or use drugs. Allergies:      Allergies  Allergen Reactions  . Novocain [Procaine] Rash         Medications Prior to Admission  Medication Sig Dispense Refill  . acetaminophen (TYLENOL) 500 MG tablet Take 1,000 mg by mouth as needed for mild pain.    Marland Kitchen atorvastatin (LIPITOR) 20 MG tablet Take 20 mg by mouth every evening.    . digoxin (LANOXIN) 0.125 MG tablet Take 1 tablet (0.125 mg total) by mouth daily. 30 tablet 0  . docusate sodium (COLACE) 100 MG capsule Take 1 capsule (100 mg total) by mouth 2 (two) times daily. 10 capsule 0  . finasteride (PROSCAR) 5 MG tablet Take 5 mg by mouth daily.     . fluticasone (FLONASE) 50 MCG/ACT nasal spray Place 1 spray into both nostrils daily.    . furosemide (LASIX) 20 MG tablet Take 1 tablet (20 mg total) by mouth daily. 30 tablet 0  . lactulose (CHRONULAC) 10 GM/15ML solution Take 15 mLs (10 g total) by mouth 2 (two) times daily as needed for mild constipation. 240 mL 0  .  metoprolol succinate (TOPROL-XL) 25 MG 24 hr tablet Take 25 mg by mouth 2 (two) times daily.    Marland Kitchen neomycin-bacitracin-polymyxin (NEOSPORIN) ointment Apply 1 application topically every 12 (twelve) hours.    . pantoprazole (PROTONIX) 40 MG tablet Take 40 mg by mouth every evening.    . sacubitril-valsartan (ENTRESTO) 24-26 MG Take 1 tablet by mouth 2 (two) times daily. 60 tablet 1    Home: Home Living Family/patient expects to be discharged to:: Private residence Living Arrangements: Spouse/significant other Available Help at Discharge: Family, Available 24 hours/day Type of Home: House Home Access: Stairs to enter [employer] of Steps: 3 + 3 Entrance Stairs-Rails: Right Home Layout: One level Bathroom Shower/Tub: Gaffer, Architectural technologist: Standard Home Equipment: Environmental consultant - standard, Bedside commode, Grab bars - tub/shower, Shower seat  Functional History:  Prior Function Level of Independence: Needs assistance Gait / Transfers Assistance Needed: Since recent admission, pt was been walking with and without AD. ADL's / Homemaking Assistance Needed: Wife having to assist with LB ADLs due to weakness Comments: Pt reporting recent falls at home Functional Status:  Mobility: Bed Mobility Overal bed mobility: Needs Assistance Bed Mobility: Supine to Sit Supine to sit: Min guard, HOB elevated General bed mobility comments: Min Guard A for safety  Transfers Overall transfer level: Needs assistance Equipment used: Rolling walker (2 wheeled) Transfers: Sit to/from Stand Sit to Stand: Min assist General transfer comment: Min A to steady and stability RW. VCs for hand placement Ambulation/Gait Ambulation/Gait assistance: Min guard, Min assist Ambulation Distance (Feet): 100 Feet Assistive device: Rolling walker (2 wheeled) Gait Pattern/deviations: Step-through pattern, Trunk flexed General Gait Details: Noting shorter steps, and difficulty attending to 2  tasks at once, had to stop walking to talk/answer questions; very motivated to improve and particiaptes well; showing fatigue at the end of the walk, with difficulty standing at sink after hallway ambulation  ADL: ADL Overall ADL's : Needs assistance/impaired Eating/Feeding: Set up, Sitting Grooming: Maximal assistance, Standing Grooming Details (indicate cue type and reason): Pt performing oral care at sink and presents with decreased balance and standing tolerance. Pt requiring Max A for maitnaining upright postioning. Pt with lright lateral lean and flexion of bilateral knees.  Upper Body Bathing: Minimal assistance, Sitting Lower Body Bathing: Moderate assistance, Sit to/from stand Upper Body Dressing : Minimal assistance, Sitting Upper Body Dressing Details (indicate cue type and reason): donned gown Lower Body Dressing: Moderate assistance, Sit to/from stand Lower Body Dressing Details (indicate cue type and reason): Pt able to adjust socks at EOB by bringing ankles up to EOB. Pt requiring Mod A for standing balance with dyanmic movement Functional mobility during ADLs: Minimal assistance, Moderate assistance, Rolling walker General ADL Comments: Pt demonstrating decreased balance and activity tolerance.   Cognition: Cognition Overall Cognitive Status: Impaired/Different from baseline Orientation Level: Oriented X4 Cognition Arousal/Alertness: Awake/alert Behavior During Therapy: WFL for tasks assessed/performed Overall Cognitive Status: Impaired/Different from baseline Area of Impairment: Problem solving, Following commands Following Commands: Follows one step commands with increased time, Follows multi-step commands inconsistently, Follows multi-step commands with increased time Problem Solving: Slow processing, Requires verbal cues, Requires tactile cues General Comments: Pt with decreased problem solving and required increased time and cues during ADLs and mobility. Pt unable  to perform divided attention to answer questions and complete mobility. Highly motivated to increased independence and safety as well as participate in therapy  Blood pressure 114/86, pulse (!) 54, temperature 97.9 F (36.6 C), temperature source Oral, resp. rate 18, height 5\' 10"  (1.778 m), weight 81.8 kg (180 lb 5.4 oz), SpO2 98 %. Physical Exam  Vitals reviewed. Constitutional: He is oriented to person, place, and time. He appears well-developed and well-nourished.  HENT:  Bur hole site clean and dry  Eyes: EOM are normal. Right eye exhibits no discharge. Left eye exhibits no discharge.  Neck: Normal range of motion. Neck supple. No thyromegaly present.  Cardiovascular:  Irregularly irregular  Respiratory: Effort normal and breath sounds normal. No respiratory distress.  GI: Soft. Bowel sounds are normal. He exhibits no distension.  Musculoskeletal:  No edema or tenderness in extremities  Neurological: He is alert and oriented to person, place, and time.  Follows full commands Motor: LUE: 5/5 proximal to distal RUE: 4+/5, except for elbow/wrist extension 3-/5 B/l LE: HF, KE 4+/5, ADF 5/5  Psychiatric:  His speech is delayed. He is slowed.    LabResultsLast24Hours  Results for orders placed or performed during the hospital encounter of 02/19/18 (from the past 24 hour(s))  Basic metabolic panel     Status: Abnormal   Collection Time: 02/23/18  3:51 AM  Result Value Ref Range   Sodium 125 (L) 135 - 145 mmol/L   Potassium 4.3 3.5 - 5.1 mmol/L   Chloride 91 (L) 101 - 111 mmol/L   CO2 28 22 - 32 mmol/L   Glucose, Bld 144 (H) 65 - 99 mg/dL   BUN 15 6 - 20 mg/dL   Creatinine, Ser 0.72 0.61 - 1.24 mg/dL   Calcium 8.1 (L) 8.9 - 10.3 mg/dL   GFR calc non Af Amer >60 >60 mL/min   GFR calc Af Amer >60 >60 mL/min   Anion gap 6 5 - 15     ImagingResults(Last48hours)  No results found.    Assessment/Plan: Diagnosis: SDH Labs and images independently  reviewed.  Records reviewed and summated above.  1. Does the need for close, 24 hr/day medical supervision in concert with the patient's rehab needs make it unreasonable for this patient to be served in a less intensive setting? Potentially  2. Co-Morbidities requiring supervision/potential complications: atrial fibrillation (monitor HR with increased physical activity), [name] (cont meds), chronic systolic congestive heart failure (Monitor in accordance with increased physical activity and avoid UE resistance excercises), hyponatremia (cont to monitor, further treatment if necessary), Steroid induced hyperglycemia (Monitor in accordance with exercise and adjust meds as necessary), ABLA (transfuse if necessary to ensure appropriate perfusion for increased activity tolerance) 3. Due to safety, skin/wound care, disease management and patient education, does the patient require 24 hr/day rehab nursing? Potentially 4. Does the patient require coordinated care of a physician, rehab nurse, PT (1-2 hrs/day, 5 days/week) and OT (1-2 hrs/day, 5 days/week) to address physical and functional deficits in the context of the above medical diagnosis(es)? Potentially Addressing deficits in the following areas: balance, endurance, locomotion, strength, transferring, bathing, dressing, toileting and psychosocial support 5. Can the patient actively participate in an intensive therapy program of at least 3 hrs of therapy per day at least 5 days per week? Yes 6. The potential for patient to make measurable gains while on inpatient rehab is good 7. Anticipated functional outcomes upon discharge from inpatient rehab are modified independent and supervision  with PT, modified independent and supervision with OT, n/a with SLP. 8. Estimated rehab length of stay to reach the above functional goals is: 7-10 days. 9. Anticipated D/C setting: Home 10. Anticipated post D/C treatments: HH therapy and Home excercise program 11. Overall  Rehab/Functional Prognosis: good  RECOMMENDATIONS: This patient's condition is appropriate for continued rehabilitative care in the following setting: Will continue to follow and consider CIR if patient does not progress with therapies to baseline. Patient has agreed to participate in recommended program. Yes Note that insurance prior authorization may be required for reimbursement for recommended care.  Comment: Rehab Admissions Coordinator to follow up.  Delice Lesch, MD, ABPMR Lavon Paganini Angiulli, PA-C 02/23/2018          Revision History                        Routing History

## 2018-02-24 NOTE — H&P (Signed)
Physical Medicine and Rehabilitation Admission H&P    Chief complaint: Headache  HPI: Ryan Thompsonis a 84 y.o.right-handed malewith history of atrial fibrillation, [name], chronic systolic congestive heart failure.History taken from chart review and patient.Patient with recent burr hole craniectomy left side chronic subdural hematoma 01/25/2018 and was later discharged home doing well. He lives with spouse. Ambulated without assistive device. Wife assisted with some lower body ADLs. Presented 02/19/2018 with recent falls and progressive neurologic decline. Noted some elevated blood pressures per home health aide. Noted hyponatremia 123. Follow-up CT reviewed, showing left SDH. Per report, recurrent and enlarging left side subdural hematoma. Underwent left bur hole for evacuation of hematoma 02/20/2018 per Dr. Arnoldo Morale.  Decadron protocol as directed.Maintained on fluid restriction for hyponatremia with latest sodium 125.  No current plan for Declomycin at this time.Monitoring for any signs of fluid overload. His Lasix has currently been placed on hold.In regards to his atrial fibrillation,not a candidate for anticoagulation due to subdural hematoma. Patient remains on [place] for seizure prophylaxis.  Patient is tolerating a regular diet.Physical and occupational therapy evaluations completed with recommendations of physical medicine rehab consult .  Patient was admitted for a comprehensive rehab program   Review of Systems  Constitutional: Positive for malaise/fatigue. Negative for chills and fever.  HENT: Negative for hearing loss.   Eyes: Negative for blurred vision and double vision.  Cardiovascular: Positive for palpitations and leg swelling. Negative for chest pain.  Gastrointestinal: Positive for constipation. Negative for nausea and vomiting.       GERD  Genitourinary: Negative for dysuria, flank pain and hematuria.  Musculoskeletal: Positive for falls and joint  pain.  Skin: Negative for rash.  All other systems reviewed and are negative.      Past Medical History:  Diagnosis Date  . Atrial fibrillation, chronic (HCC)    Not on anticoagulation due to history of bilateral subdural bleeds due to recurrent falls  . BPH (benign prostatic hypertrophy)   . [name] (coronary artery disease), native coronary artery    Severe two-vessel [name] with chronically occluded LAD and RCA with widely patent left main, intermediate, and left circumflex branches.  On medical management.  He has extensive collaterals.  . 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 fib with RVR  . [name] (degenerative joint disease)   . GERD (gastroesophageal reflux disease)   . Hyperlipidemia    LDL goal less then 100  . Hypertension   . Microscopic hematuria    [name]  . Mitral valve prolapse    with moderate MR  . Myocardial infarction [facility])    "was told 11/2017 that he'd had a heart attack; don't know when it was" (02/19/2018)  . OSA (obstructive sleep apnea)    intolerant with CPAP (02/19/2018)  . SDH (subdural hematoma) ([place]) 01/2018  . SIADH (syndrome of inappropriate ADH production) ([place])   . Skin cancer    "side of my nose" (11/19/2017)  . Stroke ([place]) 12/2017   "fully recovered"  . Thrombocytopenia (HCC)    Mild- platelet count 143,000 on 02/2011, stable 08/2011        Past Surgical History:  Procedure Laterality Date  . APPENDECTOMY  1950  . BACK SURGERY    . BURR HOLE Bilateral 01/25/2018   Procedure: Haskell Flirt;  Surgeon: Kary Kos, MD;  Location: [facility];  Service:  Neurosurgery;  Laterality: Bilateral;  . BURR HOLE Left 02/20/2018   Procedure: BURR HOLES for subdural hematoma;  Surgeon: Newman Pies, MD;  Location: [facility];  Service: Neurosurgery;  Laterality: Left;  . FOREARM FRACTURE  SURGERY Right 1954  . LUMBAR DISC SURGERY  1959   ruptured disc repair  . RIGHT/LEFT HEART CATH AND CORONARY ANGIOGRAPHY N/A 11/21/2017   Procedure: RIGHT/LEFT HEART CATH AND CORONARY ANGIOGRAPHY;  Surgeon: Sherren Mocha, MD;  Location: [facility];  Service: Cardiovascular;  Laterality: N/A;  . SKIN CANCER EXCISION Left    "side of my nose"        Family History  Problem Relation Age of Onset  . Heart failure Mother   . Heart attack Father   . [name] Father   . Heart attack Brother   . [name] Brother   . Colon cancer Brother   . Colon cancer Sister    Social History:  reports that he quit smoking about 61 years ago. His smoking use included cigarettes. He quit after 3.00 years of use. He has never used smokeless tobacco. He reports that he does not drink alcohol or use drugs. Allergies:      Allergies  Allergen Reactions  . Novocain [Procaine] Rash         Medications Prior to Admission  Medication Sig Dispense Refill  . acetaminophen (TYLENOL) 500 MG tablet Take 1,000 mg by mouth as needed for mild pain.    Marland Kitchen atorvastatin (LIPITOR) 20 MG tablet Take 20 mg by mouth every evening.    . digoxin (LANOXIN) 0.125 MG tablet Take 1 tablet (0.125 mg total) by mouth daily. 30 tablet 0  . docusate sodium (COLACE) 100 MG capsule Take 1 capsule (100 mg total) by mouth 2 (two) times daily. 10 capsule 0  . finasteride (PROSCAR) 5 MG tablet Take 5 mg by mouth daily.     . fluticasone (FLONASE) 50 MCG/ACT nasal spray Place 1 spray into both nostrils daily.    . furosemide (LASIX) 20 MG tablet Take 1 tablet (20 mg total) by mouth daily. 30 tablet 0  . lactulose (CHRONULAC) 10 GM/15ML solution Take 15 mLs (10 g total) by mouth 2 (two) times daily as needed for mild constipation. 240 mL 0  . metoprolol succinate (TOPROL-XL) 25 MG 24 hr tablet Take 25 mg by mouth 2 (two) times daily.    Marland Kitchen neomycin-bacitracin-polymyxin (NEOSPORIN) ointment Apply 1 application topically  every 12 (twelve) hours.    . pantoprazole (PROTONIX) 40 MG tablet Take 40 mg by mouth every evening.    . sacubitril-valsartan (ENTRESTO) 24-26 MG Take 1 tablet by mouth 2 (two) times daily. 60 tablet 1    Drug Regimen Review Drug regimen was reviewed and remains appropriate with no significant issues identified  Home: Home Living Family/patient expects to be discharged to:: Private residence Living Arrangements: Spouse/significant other Available Help at Discharge: Family, Available 24 hours/day Type of Home: House Home Access: Stairs to enter [employer] of Steps: 3 + 3 Entrance Stairs-Rails: Right Home Layout: One level Bathroom Shower/Tub: Gaffer, Architectural technologist: Standard Home Equipment: Environmental consultant - standard, Bedside commode, Grab bars - tub/shower, Shower seat   Functional History: Prior Function Level of Independence: Needs assistance Gait / Transfers Assistance Needed: Since recent admission, pt was been walking with and without AD. ADL's / Homemaking Assistance Needed: Wife having to assist with LB ADLs due to weakness Comments: Pt reporting recent falls at home  Functional Status:  Mobility: Bed  Mobility Overal bed mobility: Needs Assistance Bed Mobility: Supine to Sit Supine to sit: Min guard, HOB elevated General bed mobility comments: pt in recliner upon PT arrival and returned to recliner at end of session Transfers Overall transfer level: Needs assistance Equipment used: Rolling walker (2 wheeled) Transfers: Sit to/from Stand Sit to Stand: Min assist General transfer comment: min assist for steadying and to stabilize RW. VCs for hand placement Ambulation/Gait Ambulation/Gait assistance: Min assist Ambulation Distance (Feet): 115 Feet Assistive device: Rolling walker (2 wheeled) Gait Pattern/deviations: Step-through pattern, Trunk flexed General Gait Details: pt becomes easily fatigued with significant increase in flexed  posture and prominent increase in knee flexion/decreased step length with increasing distance requiring min assist for steadying.  Gait velocity: decreased Gait velocity interpretation: <1.8 ft/sec, indicate of risk for recurrent falls  ADL: ADL Overall ADL's : Needs assistance/impaired Eating/Feeding: Set up, Sitting Grooming: Maximal assistance, Standing Grooming Details (indicate cue type and reason): Pt performing oral care at sink and presents with decreased balance and standing tolerance. Pt requiring Max A for maitnaining upright postioning. Pt with lright lateral lean and flexion of bilateral knees.  Upper Body Bathing: Minimal assistance, Sitting Lower Body Bathing: Moderate assistance, Sit to/from stand Upper Body Dressing : Minimal assistance, Sitting Upper Body Dressing Details (indicate cue type and reason): donned gown Lower Body Dressing: Moderate assistance, Sit to/from stand Lower Body Dressing Details (indicate cue type and reason): Pt able to adjust socks at EOB by bringing ankles up to EOB. Pt requiring Mod A for standing balance with dyanmic movement Functional mobility during ADLs: Minimal assistance, Moderate assistance, Rolling walker General ADL Comments: Pt demonstrating decreased balance and activity tolerance.   Cognition: Cognition Overall Cognitive Status: Impaired/Different from baseline Orientation Level: Oriented X4 Cognition Arousal/Alertness: Awake/alert Behavior During Therapy: WFL for tasks assessed/performed Overall Cognitive Status: Impaired/Different from baseline Area of Impairment: Problem solving, Following commands Following Commands: Follows one step commands with increased time, Follows multi-step commands inconsistently, Follows multi-step commands with increased time Problem Solving: Slow processing, Requires verbal cues, Requires tactile cues General Comments: pt requiring increased time and cues for command following. demonstrates  increased difficulty and instability with dual tasking. pt has to stop walking to respond to questions  Physical Exam: Blood pressure 121/85, pulse (!) 49, temperature 97.9 F (36.6 C), temperature source Oral, resp. rate 16, height 5' 10"  (1.778 m), weight 81.1 kg (178 lb 12.7 oz), SpO2 99 %. Physical Exam  Vitals reviewed. Constitutional: He is oriented to person, place, and time. He appears well-developed. No distress.  HENT:  Craniectomy site intact with staples  Eyes: Right eye exhibits no discharge. Left eye exhibits no discharge.  Pupils reactive to light  Neck: Normal range of motion. Neck supple. No tracheal deviation present. No thyromegaly present.  Cardiovascular: Normal rate and regular rhythm. Exam reveals no gallop and no friction rub.  No murmur heard.    Respiratory: Effort normal and breath sounds normal. No respiratory distress. He has no wheezes. He has no rales.  GI: Soft. Bowel sounds are normal. He exhibits no distension. There is no tenderness. There is no rebound.  Neurological: He is alert and oriented to person, place, and time. No cranial nerve deficit. He exhibits normal muscle tone. Coordination normal.  Normal language. Reasonable insight and awareness. Motor 4/5 UE's. 3+ to 4/5 bilateral LE's prox to distal. No focal sensory findings although patient notes tingling in bilateral toes.   Psychiatric: He has a normal mood and affect. His behavior is  normal. Judgment and thought content normal.  Sl tangential.   Musculoskeletal: No edema or tenderness in extremities Skin.  Warm and dry bur hole sites clean and dry Neurological.  Patient is pleasant makes good eye contact with examiner.  He provides his name and age as well as place.  [place].  Followed commands      LabResultsLast48Hours        Results for orders placed or performed during the hospital encounter of 02/19/18 (from the past 48 hour(s))  Basic metabolic panel      Status: Abnormal   Collection Time: 02/23/18  3:51 AM  Result Value Ref Range   Sodium 125 (L) 135 - 145 mmol/L   Potassium 4.3 3.5 - 5.1 mmol/L   Chloride 91 (L) 101 - 111 mmol/L   CO2 28 22 - 32 mmol/L   Glucose, Bld 144 (H) 65 - 99 mg/dL   BUN 15 6 - 20 mg/dL   Creatinine, Ser 0.72 0.61 - 1.24 mg/dL   Calcium 8.1 (L) 8.9 - 10.3 mg/dL   GFR calc non Af Amer >60 >60 mL/min   GFR calc Af Amer >60 >60 mL/min    Comment: (NOTE) The eGFR has been calculated using the CKD EPI equation. This calculation has not been validated in all clinical situations. eGFR's persistently <60 mL/min signify possible Chronic Kidney Disease.    Anion gap 6 5 - 15    Comment: Performed at [facility] [address], [place], [place] [address]  Basic metabolic panel     Status: Abnormal   Collection Time: 02/24/18  3:41 AM  Result Value Ref Range   Sodium 125 (L) 135 - 145 mmol/L   Potassium 4.6 3.5 - 5.1 mmol/L   Chloride 90 (L) 101 - 111 mmol/L   CO2 28 22 - 32 mmol/L   Glucose, Bld 139 (H) 65 - 99 mg/dL   BUN 18 6 - 20 mg/dL   Creatinine, Ser 0.74 0.61 - 1.24 mg/dL   Calcium 8.3 (L) 8.9 - 10.3 mg/dL   GFR calc non Af Amer >60 >60 mL/min   GFR calc Af Amer >60 >60 mL/min    Comment: (NOTE) The eGFR has been calculated using the CKD EPI equation. This calculation has not been validated in all clinical situations. eGFR's persistently <60 mL/min signify possible Chronic Kidney Disease.    Anion gap 7 5 - 15    Comment: Performed at [facility] [address] [place], [place] [address]     ImagingResults(Last48hours)  No results found.       Medical Problem List and Plan: 1.  Decreased functional mobility secondary to enlarging left subdural hematoma.  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              -admit to  inpatient rehab 2.  DVT Prophylaxis/Anticoagulation: SCDs.  Monitor for any signs of DVT 3. Pain Management: Hydrocodone as needed 4. Mood: Provide emotional support 5. Neuropsych: This patient is capable of making decisions on his own behalf. 6. Skin/Wound Care: Routine skin checks 7. Fluids/Electrolytes/Nutrition: Routine in and outs with follow-up chemistries 8.  Chronic systolic congestive heart failure.  Monitor for any signs of fluid overload 9.  Chronic atrial fibrillation.  Not a candidate for anticoagulation due to SDH.  Continue digoxin 0.125 mg daily as well as Toprol 25 mg daily 10.  Seizure prophylaxis.  Keppra 500 mg twice daily. 11.  Hyponatremia.  Fluid restriction.  Follow-up chemistries on admit. 12.  BPH.  Pro-scar 5 mg daily.  Check PVRs x3 13.  Hyperlipidemia.  Lipitor 14.  Constipation.  Laxative assistance 15.  GERD.  Protonix   Post Admission Physician Evaluation: 1. Functional deficits secondary  to left SDH. 2. Patient is admitted to receive collaborative, interdisciplinary care between the physiatrist, rehab nursing staff, and therapy team. 3. Patient's level of medical complexity and substantial therapy needs in context of that medical necessity cannot be provided at a lesser intensity of care such as a SNF. 4. Patient has experienced substantial functional loss from his/her baseline which was documented above under the "Functional History" and "Functional Status" headings.  Judging by the patient's diagnosis, physical exam, and functional history, the patient has potential for functional progress which will result in measurable gains while on inpatient rehab.  These gains will be of substantial and practical use upon discharge  in facilitating mobility and self-care at the household level. 5. Physiatrist will provide 24 hour management of medical needs as well as oversight of the therapy plan/treatment and provide guidance as appropriate regarding the interaction  of the two. 6. The Preadmission Screening has been reviewed and patient status is unchanged unless otherwise stated above. 7. 24 hour rehab nursing will assist with bladder management, bowel management, safety, skin/wound care, disease management, medication administration, pain management and patient education  and help integrate therapy concepts, techniques,education, etc. 8. PT will assess and treat for/with: Lower extremity strength, range of motion, stamina, balance, functional mobility, safety, adaptive techniques and equipment, NMR, visual-spatial awareness.   Goals are: mod I to supervision. 9. OT will assess and treat for/with: ADL's, functional mobility, safety, upper extremity strength, adaptive techniques and equipment, NMR, family ed, community reentry.   Goals are: mod I to supervision. Therapy may proceed with showering this patient. 10. SLP will assess and treat for/with: cognition, communication, family ed.  Goals are: mod I to supervision. 11. Case Management and Social Worker will assess and treat for psychological issues and discharge planning. 12. Team conference will be held weekly to assess progress toward goals and to determine barriers to discharge. 13. Patient will receive at least 3 hours of therapy per day at least 5 days per week. 14. ELOS: 7-10 days       15. Prognosis:  excellent     I have personally performed a face to face diagnostic evaluation of this patient. Additionally, I have reviewed and concur with the physician assistant's documentation above.  Meredith Staggers, MD, FAAPMR  Lavon Paganini Capac, PA-C 02/24/2018

## 2018-02-24 NOTE — Care Management Important Message (Signed)
Important Message  Patient Details  Name: Ryan Savage MRN: [id number] Date of Birth: 1929/10/27   Medicare Important Message Given:  Yes    [name] 02/24/2018, 1:50 PM

## 2018-02-24 NOTE — Progress Notes (Signed)
I clarified with pt and spouse that pt does not have a legal guardian. His wife has POA. [phone]

## 2018-02-24 NOTE — Progress Notes (Signed)
Neurosurgery Progress Note  Feels well this am with exception of knee weakness Denies focal deficits Able to ambulate in halls  EXAM:  BP (!) 147/74   Pulse 82   Temp 97.8 F (36.6 C) (Oral)   Resp 14   Ht 5\' 10"  (1.778 m)   Wt 81.1 kg (178 lb 12.7 oz)   SpO2 100%   BMI 25.65 kg/m   Awake, alert, oriented  Speech fluent, appropriate  MAEW with good strength CN grossly intact Incision c/d/i\  PLAN Doing well this am Cleared for d/c when bed available.   Generalized weakness: No focal deficits. Needs rehab - CIR Hyponatremia: stable. Appreciate TH assistance with care.  No new recs.  Continue current care Dispo planning

## 2018-02-24 NOTE — Progress Notes (Signed)
Occupational Therapy Treatment Patient Details Name: Ark Agrusa MRN: [id number] DOB: Jul 17, 1929 Today's Date: 02/24/2018    History of present illness Willman Gorr is a 84 y.o. male with medical history significant for ischemic cardiomyopathy with EF 30-35%, mild mitral valve prolapse confirmed by cardiac catheterization, chronic atrial fibrillation no longer on anticoagulation secondary to subdural hematoma status post burr holes on 3/17, hypertension, [name] tonically occluded LAD and RCA with extensive collaterals, dyslipidemia, BPH, [name], GERD and SIADH.  Patient was admitted to the hospital on 4/11 for dysarthria and weakness. Found to have worsening SDH, now s/p another Burr-hole procedure on 4/12   OT comments  Pt fatigued this am due to not sleeping last night.  He was instructed in use of sock aid for home use.  He required mod A and mod cues for use due to problem solving and memory deficits.   Follow Up Recommendations  CIR;Supervision/Assistance - 24 hour    Equipment Recommendations       Recommendations for Other Services Rehab consult;PT consult;Speech consult    Precautions / Restrictions Precautions Precautions: Fall Restrictions Weight Bearing Restrictions: No       Mobility Bed Mobility Overal bed mobility: Needs Assistance Bed Mobility: Supine to Sit;Sit to Supine     Supine to sit: Min guard Sit to supine: Min guard   General bed mobility comments: pt in recliner upon PT arrival and returned to recliner at end of session  Transfers Overall transfer level: Needs assistance Equipment used: Rolling walker (2 wheeled) Transfers: Sit to/from Stand Sit to Stand: Supervision         General transfer comment: Pt deferred due to fatigue     Balance Overall balance assessment: Needs assistance Sitting-balance support: Feet supported;No upper extremity supported Sitting balance-Leahy Scale: Good     Standing balance support: During functional  activity;Bilateral upper extremity supported Standing balance-Leahy Scale: Poor Standing balance comment: Reliant on UE support                           ADL either performed or assessed with clinical judgement   ADL Overall ADL's : Needs assistance/impaired                     Lower Body Dressing: Minimal assistance;Sit to/from stand Lower Body Dressing Details (indicate cue type and reason): Pt able to lean forwaed to don/doff socks this date, but reports he often has difficulty accessing feet due to back pain.  He was instructed in use and acquisition of sock aid, but requires mod A and mod verbal cues for use                      Vision       Perception     Praxis      Cognition Arousal/Alertness: Awake/alert Behavior During Therapy: WFL for tasks assessed/performed Overall Cognitive Status: Impaired/Different from baseline Area of Impairment: Attention;Problem solving                     Memory: Decreased short-term memory Following Commands: Follows one step commands with increased time;Follows multi-step commands inconsistently;Follows multi-step commands with increased time     Problem Solving: Difficulty sequencing;Requires verbal cues;Requires tactile cues General Comments: required mod cues to utilize aid even after demonstration - difficulty problem solving through errors         Exercises Exercises: General Lower Extremity General Exercises - Lower Extremity Hip  Flexion/Marching: Standing;Both;10 reps Heel Raises: AROM;15 reps;Both;Standing Mini-Sqauts: AROM;15 reps;Standing   Shoulder Instructions       General Comments      Pertinent Vitals/ Pain       Pain Assessment: Faces Faces Pain Scale: Hurts a little bit Pain Location: Head/Skull Pain Descriptors / Indicators: Grimacing Pain Intervention(s): Monitored during session  Home Living                                          Prior  Functioning/Environment              Frequency  Min 2X/week        Progress Toward Goals  OT Goals(current goals can now be found in the care plan section)  Progress towards OT goals: Progressing toward goals  Acute Rehab OT Goals Patient Stated Goal: to go home and decrease fall risk  Plan Discharge plan remains appropriate    Co-evaluation                 AM-PAC PT "6 Clicks" Daily Activity     Outcome Measure   Help from another person eating meals?: None Help from another person taking care of personal grooming?: A Little Help from another person toileting, which includes using toliet, bedpan, or urinal?: A Little Help from another person bathing (including washing, rinsing, drying)?: A Little Help from another person to put on and taking off regular upper body clothing?: A Little Help from another person to put on and taking off regular lower body clothing?: A Little 6 Click Score: 19    End of Session    OT Visit Diagnosis: Unsteadiness on feet (R26.81)   Activity Tolerance Patient limited by fatigue   Patient Left in bed;with call bell/phone within reach;with bed alarm set   Nurse Communication Mobility status        Time: 0300-9233 OT Time Calculation (min): 19 min  Charges: OT General Charges $OT Visit: 1 Visit OT Treatments $Self Care/Home Management : 8-22 mins  [facility], OTR/L [phone]    Lucille Passy M 02/24/2018, 1:49 PM

## 2018-02-25 ENCOUNTER — Inpatient Hospital Stay (HOSPITAL_COMMUNITY): Payer: Medicare Other | Admitting: Physical Therapy

## 2018-02-25 ENCOUNTER — Inpatient Hospital Stay (HOSPITAL_COMMUNITY): Payer: Medicare Other | Admitting: Occupational Therapy

## 2018-02-25 ENCOUNTER — Inpatient Hospital Stay (HOSPITAL_COMMUNITY): Payer: Medicare Other | Admitting: Speech Pathology

## 2018-02-25 DIAGNOSIS — G8191 Hemiplegia, unspecified affecting right dominant side: Secondary | ICD-10-CM

## 2018-02-25 DIAGNOSIS — S065X0D Traumatic subdural hemorrhage without loss of consciousness, subsequent encounter: Secondary | ICD-10-CM

## 2018-02-25 DIAGNOSIS — E222 Syndrome of inappropriate secretion of antidiuretic hormone: Secondary | ICD-10-CM

## 2018-02-25 LAB — CBC WITH DIFFERENTIAL/PLATELET
Basophils Absolute: 0 10*3/uL (ref 0.0–0.1)
Basophils Relative: 0 %
Eosinophils Absolute: 0 10*3/uL (ref 0.0–0.7)
Eosinophils Relative: 0 %
HCT: 36.9 % — ABNORMAL LOW (ref 39.0–52.0)
Hemoglobin: 12.7 g/dL — ABNORMAL LOW (ref 13.0–17.0)
Lymphocytes Relative: 17 %
Lymphs Abs: 1.6 10*3/uL (ref 0.7–4.0)
MCH: 31.5 pg (ref 26.0–34.0)
MCHC: 34.4 g/dL (ref 30.0–36.0)
MCV: 91.6 fL (ref 78.0–100.0)
Monocytes Absolute: 0.6 10*3/uL (ref 0.1–1.0)
Monocytes Relative: 6 %
Neutro Abs: 7.4 10*3/uL (ref 1.7–7.7)
Neutrophils Relative %: 77 %
Platelets: 169 10*3/uL (ref 150–400)
RBC: 4.03 MIL/uL — ABNORMAL LOW (ref 4.22–5.81)
RDW: 14.1 % (ref 11.5–15.5)
WBC: 9.6 10*3/uL (ref 4.0–10.5)

## 2018-02-25 LAB — COMPREHENSIVE METABOLIC PANEL
ALT: 12 U/L — ABNORMAL LOW (ref 17–63)
AST: 21 U/L (ref 15–41)
Albumin: 2.6 g/dL — ABNORMAL LOW (ref 3.5–5.0)
Alkaline Phosphatase: 49 U/L (ref 38–126)
Anion gap: 7 (ref 5–15)
BUN: 20 mg/dL (ref 6–20)
CO2: 27 mmol/L (ref 22–32)
Calcium: 8.2 mg/dL — ABNORMAL LOW (ref 8.9–10.3)
Chloride: 90 mmol/L — ABNORMAL LOW (ref 101–111)
Creatinine, Ser: 0.7 mg/dL (ref 0.61–1.24)
GFR calc Af Amer: >60 mL/min (ref >60)
GFR calc non Af Amer: >60 mL/min (ref >60)
Glucose, Bld: 148 mg/dL — ABNORMAL HIGH (ref 65–99)
Potassium: 4.3 mmol/L (ref 3.5–5.1)
Sodium: 124 mmol/L — ABNORMAL LOW (ref 135–145)
Total Bilirubin: 0.7 mg/dL (ref 0.3–1.2)
Total Protein: 5.1 g/dL — ABNORMAL LOW (ref 6.5–8.1)

## 2018-02-25 MED ORDER — FUROSEMIDE 20 MG PO TABS
20.0000 mg | ORAL_TABLET | Freq: Every day | ORAL | Status: DC
  Administered 2018-02-25: 20 mg via ORAL
  Filled 2018-02-25 (×2): qty 1

## 2018-02-25 NOTE — Progress Notes (Signed)
Social Work  Social Work Assessment and Plan  Patient Details  Name: Ryan Savage MRN: [id number] Date of Birth: 1929-08-06  Today's Date: 02/25/2018  Problem List:  Patient Active Problem List   Diagnosis Date Noted  . Traumatic subdural hematoma ([place]) 02/24/2018  . Acute blood loss anemia   . Steroid-induced hyperglycemia   . 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 Chronic systolic CHF (congestive heart failure), NYHA class 2 ([place]) 02/20/2018  . Hypertension 02/20/2018  . Chronic atrial fibrillation ([place]) 02/20/2018  . Coronary artery disease 02/20/2018  . Mild mitral valve regurgitation 02/20/2018  . Subdural hematoma ([place]) 02/20/2018  . [name] (coronary artery disease), native coronary artery   . SDH (subdural hematoma) ([place]) 01/25/2018  . Bilateral subdural hematomas ([place]) 01/23/2018  . Malnutrition of moderate degree 12/10/2017  . Cerebral embolism with cerebral infarction 12/09/2017  . Odynophagia 12/08/2017  . Syncope and collapse 12/08/2017  . Hyperlipidemia 12/08/2017  . OSA (obstructive sleep apnea) 12/08/2017  . Dysarthria 12/08/2017  . [name] (coronary artery disease) 12/08/2017  . Chronic systolic heart failure ([place]) 12/08/2017  . Syndrome of inappropriate ADH (SIADH) secretion ([place]) 12/08/2017  . SIADH (syndrome of inappropriate ADH production) ([place])   . Subclinical hyperthyroidism   . Hyponatremia with excess extracellular fluid volume 11/18/2017  . Hyperglycemia 11/18/2017  . Supratherapeutic INR 11/18/2017  . Mitral valve prolapse   . Permanent atrial fibrillation ([place]) 11/08/2013  . Benign essential HTN 11/08/2013  . Mitral regurgitation 11/08/2013   Past Medical History:  Past Medical History:  Diagnosis Date  . Atrial fibrillation, chronic (HCC)    Not on anticoagulation due to history of bilateral subdural bleeds due to recurrent falls  . BPH (benign prostatic hypertrophy)   . [name] (coronary artery disease), native coronary artery    Severe two-vessel [name] with  chronically occluded LAD and RCA with widely patent left main, intermediate, and left circumflex branches.  On medical management.  He has extensive collaterals.  . 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 fib with RVR  . [name] (degenerative joint disease)   . GERD (gastroesophageal reflux disease)   . Hyperlipidemia    LDL goal less then 100  . Hypertension   . Microscopic hematuria    [name]  . Mitral valve prolapse    with moderate MR  . Myocardial infarction [facility])    "was told 11/2017 that he'd had a heart attack; don't know when it was" (02/19/2018)  . OSA (obstructive sleep apnea)    intolerant with CPAP (02/19/2018)  . SDH (subdural hematoma) ([place]) 01/2018  . SIADH (syndrome of inappropriate ADH production) ([place])   . Skin cancer    "side of my nose" (11/19/2017)  . Stroke ([place]) 12/2017   "fully recovered"  . Thrombocytopenia (HCC)    Mild- platelet count 143,000 on 02/2011, stable 08/2011   Past Surgical History:  Past Surgical History:  Procedure Laterality Date  . APPENDECTOMY  1950  . BACK SURGERY    . BURR HOLE Bilateral 01/25/2018   Procedure: Haskell Flirt;  Surgeon: Kary Kos, MD;  Location: [facility];  Service: Neurosurgery;  Laterality: Bilateral;  . BURR HOLE Left 02/20/2018   Procedure: BURR HOLES for subdural hematoma;  Surgeon: Newman Pies, MD;  Location: [facility];  Service: Neurosurgery;  Laterality: Left;  . FOREARM FRACTURE SURGERY Right 1954  . LUMBAR DISC SURGERY  1959   ruptured disc repair  . RIGHT/LEFT HEART CATH AND CORONARY ANGIOGRAPHY N/A 11/21/2017   Procedure: RIGHT/LEFT HEART CATH AND CORONARY ANGIOGRAPHY;  Surgeon: Sherren Mocha, MD;  Location: [place] [facility];  Service: Cardiovascular;  Laterality: N/A;  . SKIN CANCER EXCISION Left    "side of my nose"   Social History:  reports that he quit  smoking about 61 years ago. His smoking use included cigarettes. He quit after 3.00 years of use. He has never used smokeless tobacco. He reports that he does not drink alcohol or use drugs.  Family / Support Systems Marital Status: Married Patient Roles: Spouse, Parent Spouse/Significant Other: Enid Derry [phone] Children: Cal-son [phone] Other Supports: Friends and church members Anticipated Caregiver: Wife Ability/Limitations of Caregiver: Wife is frail and can only provide supervision level. Cal-son is involved and will check in on daily Caregiver Availability: 24/7 Family Dynamics: Close knit family who will do what ever pt needs. Wife was helping wiht his ADL's prior to admission after first surgery-in March. Son tries to touch base daily to make sure both are doing well.  Social History Preferred language: English Religion: Baptist Cultural Background: No issues Education: Secretary/administrator educated Read: Yes Write: Yes Employment Status: Retired Freight forwarder Issues: No issues Guardian/Conservator: None-according to MD pt is capable of making his own decisions while here. Son is his POA if needed.   Abuse/Neglect Abuse/Neglect Assessment Can Be Completed: Yes Physical Abuse: Denies Verbal Abuse: Denies Sexual Abuse: Denies Exploitation of patient/patient's resources: Denies Self-Neglect: Denies  Emotional Status Pt's affect, behavior adn adjustment status: Pt is motivated and ready to get started. He is not one to shy away from a challenge. He was recovering from his previous surgery in March when this occured again. Son and wife are very supportive and involved. Pt wants to do it on his won though, this is his goal. Recent Psychosocial Issues: recent surgery in March which he was discharged home and was doing well until this occurred Pyschiatric History: No history deferred depression screen due to doing well and verbalizing his deficits and  goals while here. Will get input from team if neuro-psych services would be benefical while here. Substance Abuse History: No issues  Patient / Family Perceptions, Expectations & Goals Pt/Family understanding of illness & functional limitations: Pt, son and wife can explain his surgeries and treatment plan. All talk with the MD's and feel they have a good understanding of his treatment plan. Pt hopes this is the last time this will happen and can recover and be done with it. Premorbid pt/family roles/activities: Husband, father, retiree, friend, church member, etc Anticipated changes in roles/activities/participation: resume Pt/family expectations/goals: Pt states: " I want to be able to take care of myself before I leave here." Wife states: " I hope he does as well as he did before in March."  Son states: " I hope he does well and recovers, he has made progress already."  [employer]: None Premorbid Home Care/DME Agencies: Other (Comment)([facility] active PTA) Transportation available at discharge: Wife and son Resource referrals recommended: Support group (specify)  Discharge Planning Living Arrangements: Spouse/significant other Support Systems: Spouse/significant other, Children, Friends/neighbors, Social worker community Type of Residence: Private residence Insurance Resources: Multimedia programmer (specify)(UHC-Medicare) Financial Resources: Radio broadcast assistant Screen Referred: No Living Expenses: Own Money Management: Patient Does the patient have any problems obtaining your medications?: No Home Management: Both he and wife Patient/Family Preliminary Plans: Return home with wife who can assist some but  not provide physical care, due to her own health issues. Son is very involved and supportive, here daily with Mom. Await team evaluations and work on safe plan for all. [place] Barriers to Discharge: Decreased caregiver support [place] Barriers to Discharge Comments:  Wife limited with assist Social Work Anticipated Follow Up Needs: HH/OP, Support Group  Clinical Impression Pleasant gentleman who looks good up and bright and ready to go to therapies. Supportive wife and son are here and observing in therapies. Pt recovered form this once and now it has happened again. Will work on safe discharge plan. See if would benefit from neuro-psych services while here.  Elease Hashimoto 02/25/2018, 9:51 AM

## 2018-02-25 NOTE — Progress Notes (Signed)
Patient information reviewed and entered into eRehab system by Sumayyah Custodio, RN, CRRN, PPS Coordinator.  Information including medical coding and functional independence measure will be reviewed and updated through discharge.     Per nursing patient was given "Data Collection Information Summary for Patients in Inpatient Rehabilitation Facilities with attached "Privacy Act Statement-Health Care Records" upon admission.  

## 2018-02-25 NOTE — Patient Care Conference (Signed)
Inpatient RehabilitationTeam Conference and Plan of Care Update Date: 02/25/2018   Time: 10:30 AM    Patient Name: Ryan Savage      Medical Record Number: [id number]  Date of Birth: 1929/04/08 Sex: Male         Room/Bed: 4W15C/4W15C-01 Payor Info: Payor: Theme park manager MEDICARE / Plan: UHC MEDICARE / Product Type: *No Product type* /    Admitting Diagnosis: CVA  Admit Date/Time:  02/24/2018  5:33 PM Admission Comments: No comment available   Primary Diagnosis:  <principal problem not specified> Principal Problem: <principal problem not specified>  Patient Active Problem List   Diagnosis Date Noted  . Traumatic subdural hematoma ([place]) 02/24/2018  . Acute blood loss anemia   . Steroid-induced hyperglycemia   . 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 Chronic systolic CHF (congestive heart failure), NYHA class 2 ([place]) 02/20/2018  . Hypertension 02/20/2018  . Chronic atrial fibrillation ([place]) 02/20/2018  . Coronary artery disease 02/20/2018  . Mild mitral valve regurgitation 02/20/2018  . Subdural hematoma ([place]) 02/20/2018  . [name] (coronary artery disease), native coronary artery   . SDH (subdural hematoma) ([place]) 01/25/2018  . Bilateral subdural hematomas ([place]) 01/23/2018  . Malnutrition of moderate degree 12/10/2017  . Cerebral embolism with cerebral infarction 12/09/2017  . Odynophagia 12/08/2017  . Syncope and collapse 12/08/2017  . Hyperlipidemia 12/08/2017  . OSA (obstructive sleep apnea) 12/08/2017  . Dysarthria 12/08/2017  . [name] (coronary artery disease) 12/08/2017  . Chronic systolic heart failure ([place]) 12/08/2017  . Syndrome of inappropriate ADH (SIADH) secretion ([place]) 12/08/2017  . SIADH (syndrome of inappropriate ADH production) ([place])   . Subclinical hyperthyroidism   . Hyponatremia with excess extracellular fluid volume 11/18/2017  . Hyperglycemia 11/18/2017  . Supratherapeutic INR 11/18/2017  . Mitral valve prolapse   . Permanent atrial fibrillation ([place]) 11/08/2013  . Benign  essential HTN 11/08/2013  . Mitral regurgitation 11/08/2013    Expected Discharge Date: Expected Discharge Date: 03/05/18  Team Members Present: Physician leading conference: Dr. Alysia Penna Social Worker Present: Ovidio Kin, LCSW Nurse Present: Dorthula Nettles, RN PT Present: Lavone Nian, PT OT Present: Napoleon Form, OT SLP Present: Stormy Fabian, SLP PPS Coordinator present : Daiva Nakayama, RN, CRRN     Current Status/Progress Goal Weekly Team Focus  Medical   traumatic brain injury status post craniotomy, hyponatremia  reduce fall risk  hyponatremia monitor fluid status   Bowel/Bladder   cont b/b; lbm 4/16  maintain  assess q shift and prn   Swallow/Nutrition/ Hydration             ADL's   Min-mod A functional transfers, Steadying assist bathing/dressing and toileting tasks  Supervision overall  ADL re-training, functional activity tolerance, UE strengthening, functional ambulation and transfers   Mobility   min assist with RW, R knee buckles  supervision overall with LRAD  R NMR, balance, transfers, gait, endurance, stairs, pt/family education, safety awareness, strengthening   Communication             Safety/Cognition/ Behavioral Observations  min-mod assist   supervision-min assist   executive functioning, memory, safety awareness, problem solving, selective attention    Pain   denies  <3  assess q shift and prn   Skin   ecchymosis BUE; rash-groin; staples to incision sites L side of head  free from infection/breakdown duirng rehab stay  assess q shift and prn; cream& powder to groin; incsion sites clean,dry      *See Care Plan and progress notes for long and short-term goals.  Barriers to Discharge  Current Status/Progress Possible Resolutions Date Resolved   Physician    Inaccessible home environment;Medical stability;Incontinence  recurrent falls  initiating rehab program  if hyponatremia worsens may need IM vs nephro consult      Nursing                   PT  Decreased caregiver support                 OT                  SLP                SW Decreased caregiver support Wife limited with assist            Discharge Planning/Teaching Needs:    Home with wife and son checking in on. Wife can provide supervision level. Here on day of evaluation     Team Discussion:  Goals supervision level. Working on balance and coordination. R-knee buckles at times. Mild cognitive issues speech to evaluate today. Will be here short time. 7-10 days   Revisions to Treatment Plan:  DC 4/25 New evaluation    Continued Need for Acute Rehabilitation Level of Care: The patient requires daily medical management by a physician with specialized training in physical medicine and rehabilitation for the following conditions: Daily direction of a multidisciplinary physical rehabilitation program to ensure safe treatment while eliciting the highest outcome that is of practical value to the patient.: Yes Daily medical management of patient stability for increased activity during participation in an intensive rehabilitation regime.: Yes Daily analysis of laboratory values and/or radiology reports with any subsequent need for medication adjustment of medical intervention for : Neurological problems;Other  Cree Napoli, Gardiner Rhyme 02/25/2018, 1:09 PM

## 2018-02-25 NOTE — Evaluation (Signed)
Physical Therapy Assessment and Plan  Patient Details  Name: Ryan Savage MRN: [id number] Date of Birth: 09-19-1929  PT Diagnosis: Abnormality of gait, Coordination disorder, R Hemiparesis, Impaired cognition and Muscle weakness Rehab Potential: Good ELOS: 7-10 days   Today's Date: 02/25/2018 PT Individual Time: 0315-9458 PT Individual Time Calculation (min): 60 min    Problem List:  Patient Active Problem List   Diagnosis Date Noted  . Traumatic subdural hematoma ([place]) 02/24/2018  . Acute blood loss anemia   . Steroid-induced hyperglycemia   . 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 Chronic systolic CHF (congestive heart failure), NYHA class 2 ([place]) 02/20/2018  . Hypertension 02/20/2018  . Chronic atrial fibrillation ([place]) 02/20/2018  . Coronary artery disease 02/20/2018  . Mild mitral valve regurgitation 02/20/2018  . Subdural hematoma ([place]) 02/20/2018  . [name] (coronary artery disease), native coronary artery   . SDH (subdural hematoma) ([place]) 01/25/2018  . Bilateral subdural hematomas ([place]) 01/23/2018  . Malnutrition of moderate degree 12/10/2017  . Cerebral embolism with cerebral infarction 12/09/2017  . Odynophagia 12/08/2017  . Syncope and collapse 12/08/2017  . Hyperlipidemia 12/08/2017  . OSA (obstructive sleep apnea) 12/08/2017  . Dysarthria 12/08/2017  . [name] (coronary artery disease) 12/08/2017  . Chronic systolic heart failure ([place]) 12/08/2017  . Syndrome of inappropriate ADH (SIADH) secretion ([place]) 12/08/2017  . SIADH (syndrome of inappropriate ADH production) ([place])   . Subclinical hyperthyroidism   . Hyponatremia with excess extracellular fluid volume 11/18/2017  . Hyperglycemia 11/18/2017  . Supratherapeutic INR 11/18/2017  . Mitral valve prolapse   . Permanent atrial fibrillation ([place]) 11/08/2013  . Benign essential HTN 11/08/2013  . Mitral regurgitation 11/08/2013    Past Medical History:  Past Medical History:  Diagnosis Date  . Atrial fibrillation, chronic (HCC)    Not on anticoagulation due to history of bilateral subdural bleeds due to recurrent falls  . BPH (benign prostatic hypertrophy)   . [name] (coronary artery disease), native coronary artery    Severe two-vessel [name] with chronically occluded LAD and RCA with widely patent left main, intermediate, and left circumflex branches.  On medical management.  He has extensive collaterals.  . 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 fib with RVR  . [name] (degenerative joint disease)   . GERD (gastroesophageal reflux disease)   . Hyperlipidemia    LDL goal less then 100  . Hypertension   . Microscopic hematuria    [name]  . Mitral valve prolapse    with moderate MR  . Myocardial infarction [facility])    "was told 11/2017 that he'd had a heart attack; don't know when it was" (02/19/2018)  . OSA (obstructive sleep apnea)    intolerant with CPAP (02/19/2018)  . SDH (subdural hematoma) ([place]) 01/2018  . SIADH (syndrome of inappropriate ADH production) ([place])   . Skin cancer    "side of my nose" (11/19/2017)  . Stroke ([place]) 12/2017   "fully recovered"  . Thrombocytopenia (HCC)    Mild- platelet count 143,000 on 02/2011, stable 08/2011   Past Surgical History:  Past Surgical History:  Procedure Laterality Date  . APPENDECTOMY  1950  . BACK SURGERY    . BURR HOLE Bilateral 01/25/2018   Procedure: Haskell Flirt;  Surgeon: Kary Kos, MD;  Location: [facility];  Service: Neurosurgery;  Laterality: Bilateral;  . BURR HOLE Left 02/20/2018   Procedure:  BURR HOLES for subdural hematoma;  Surgeon: Newman Pies, MD;  Location: [facility];  Service: Neurosurgery;  Laterality: Left;  . FOREARM FRACTURE SURGERY Right 1954  . LUMBAR DISC SURGERY  1959   ruptured disc repair  . RIGHT/LEFT HEART CATH AND CORONARY ANGIOGRAPHY N/A 11/21/2017   Procedure: RIGHT/LEFT HEART CATH AND  CORONARY ANGIOGRAPHY;  Surgeon: Sherren Mocha, MD;  Location: [place] [facility];  Service: Cardiovascular;  Laterality: N/A;  . SKIN CANCER EXCISION Left    "side of my nose"    Assessment & Plan Clinical Impression: Ryan Thompsonis a 84 y.o.right-handed malewith history of atrial fibrillation, [name], chronic systolic congestive heart failure.History taken from chart review and patient.Patient with recent burr hole craniectomy left side chronic subdural hematoma 01/25/2018 and was later discharged home doing well. He lives with spouse. Ambulated without assistive device. Wife assisted with some lower body ADLs. Presented 02/19/2018 with recent falls and progressive neurologic decline. Noted some elevated blood pressures per home health aide. Noted hyponatremia 123. Follow-up CT reviewed, showing left SDH. Per report, recurrent and enlarging left side subdural hematoma. Underwent left bur hole for evacuation of hematoma 02/20/2018 per Dr. Arnoldo Morale.Decadron protocol as directed.Maintained on fluid restriction for hyponatremiawith latest sodium 125.No current plan for Declomycin at this time.Monitoring for any signs of fluid overload. His Lasixhas currently been placed on hold.In regards to his atrial fibrillation,not a candidate for anticoagulation due to subdural hematoma. Patient remains on [place] for seizure prophylaxis.Patient is tolerating a regular diet.Physicaland occupationaltherapy evaluationscompleted with recommendations of physical medicine rehab consult .Patient was admitted for a comprehensive rehab program. Patient transferred to CIR on 02/24/2018 .   Patient currently requires min with mobility secondary to muscle weakness, decreased cardiorespiratoy endurance, decreased coordination and hemiplegia.  Prior to hospitalization, patient was independent  with mobility and lived with wife in a House home.  Home access is 3+3Stairs to enter.  Patient will benefit  from skilled PT intervention to maximize safe functional mobility for planned discharge home with 24 hour supervision.  Anticipate patient will benefit from follow up [place] at discharge.  PT - End of Session Activity Tolerance: Tolerates 30+ min activity with multiple rests Endurance Deficit: Yes Rehab Potential (ACUTE/IP ONLY): Good PT Barriers to Discharge: Decreased caregiver support PT Patient demonstrates impairments in the following area(s): Balance;Endurance;Motor;Safety PT Plan PT Intensity: Minimum of 1-2 x/day ,45 to 90 minutes PT Frequency: 5 out of 7 days PT Duration Estimated Length of Stay: 7-10 days PT Treatment/Interventions: Ambulation/gait training;Patient/family education;Neuromuscular re-education;Stair training;Therapeutic Exercise;UE/LE Coordination activities;Disease management/prevention;Community reintegration;Balance/vestibular training;Functional mobility training;DME/adaptive equipment instruction;Discharge planning;Psychosocial support;Therapeutic Activities;UE/LE Strength taining/ROM;Visual/perceptual remediation/compensation PT Recommendation Recommendations for Other Services: Therapeutic Recreation consult Therapeutic Recreation Interventions: Outing/community reintergration Follow Up Recommendations: Home health PT Patient destination: Home Equipment Recommended: Rolling walker with 5" wheels Equipment Details: Pt's wife reports pt has three walkers at home   Skilled Therapeutic Intervention Pt received seated EOB, RN administering medications and taking vitals, family present. Pt oriented x4 and agreeable to therapy. Pt reports multiple hospital admissions since December 2/2 CHF, CVA and falls. Pt reports living with wife in a single level home with 3+3 STE at side door with single railing. Since previous admission, pt began using front door with small step but no railings. Prior to initial admission to hospital, pt was independent but has been using a walker  since prior admission in January. Pt reported two falls in previous 4 months, attributed to change in BP medication. Pt's family reports they noticed changes in RUE function  and speech, leading to hospitalization. Pt and family educated on PT POC and CIR information, including weekly conference meetings.  Pt ambulated w/ RW and steady assist to gym, demonstrating forward flexed posture, decreased bilateral heel strike, and decreased step length. Pt ambulated w/o AD and min assist 10 ft in same manner. Pt had one instance of R knee buckling but was able to self-correct. Pt performed car transfer with min assist and verbal cuing for technique and sequencing. Pt educated on stair negotiation and performed 4 stairs w/ bilateral railing use and min assist. Pt required verbal cuing for sequencing and step-to pattern. Pt provided w/ w/c and was transported back to room for time management. Pt left seated in recliner, chair alarm on, quick release belt on, and all needs within reach.   PT Evaluation Precautions/Restrictions Precautions Precautions: Fall Restrictions Weight Bearing Restrictions: No General Chart Reviewed: Yes Additional Pertinent History: A fib, [name], CHF, prior CVA, MI Response to Previous Treatment: Patient with no complaints from previous session. Family/Caregiver Present: Yes   Vital SignsTherapy Vitals Pulse Rate: 74 BP: (!) 135/91   Home Living/Prior Functioning Home Living Living Arrangements: Spouse/significant other Available Help at Discharge: Family;Available 24 hours/day Type of Home: House Home Access: Stairs to enter [employer] of Steps: 3+3 Entrance Stairs-Rails: Right;Left Home Layout: One level  Lives With: Spouse Prior Function Level of Independence: Independent with basic ADLs;Independent with homemaking with ambulation;Independent with gait;Independent with transfers, used AD  Able to Take Stairs?: Yes Comments: Pt reports multiple falls and  multiple admissions since January  Vision/Perception  Wears glasses at all times baseline Cognition Overall Cognitive Status: Impaired/Different from baseline Arousal/Alertness: Awake/alert Orientation Level: Oriented X4 Memory: Impaired Memory Impairment: Decreased short term memory;Decreased recall of new information Decreased Short Term Memory: Verbal basic;Functional basic Awareness: Impaired Awareness Impairment: Emergent impairment Problem Solving: Impaired Problem Solving Impairment: Verbal complex;Functional complex Safety/Judgment: Impaired Comments: Decreased ability to engage in dual task activities, walking and talking Sensation Sensation Pt's wife reports numbness in bilateral toes Coordination Gross Motor Movements are Fluid and Coordinated: No Motor  Motor Motor: Hemiplegia Motor - Skilled Clinical Observations: R hemiparesis, global weakness   Mobility Transfers Transfers: Yes Sit to Stand: 4: Min assist Sit to Stand Details: Verbal cues for technique Stand Pivot Transfers: 4: Min assist Stand Pivot Transfer Details: Verbal cues for technique;Verbal cues for sequencing Stand Pivot Transfer Details (indicate cue type and reason): w/ RW Locomotion  Ambulation Ambulation: Yes Ambulation/Gait Assistance: 4: Min assist Ambulation Distance (Feet): 200 Feet Assistive device: Rolling walker Ambulation/Gait Assistance Details: Verbal cues for gait pattern;Verbal cues for sequencing;Verbal cues for precautions/safety;Verbal cues for technique Gait Gait: Yes Gait Pattern: Impaired Gait Pattern: Trunk flexed;Decreased step length - right;Decreased step length - left Gait velocity: decreased Stairs / Additional Locomotion Stairs: Yes Stairs Assistance: 4: Min assist Stairs Assistance Details: Verbal cues for sequencing;Verbal cues for technique Stair Management Technique: Two rails Number of Stairs: 4 Height of Stairs: 6  Trunk/Postural Assessment  Cervical  Assessment Cervical Assessment: Exceptions to WFL(forward head ) Thoracic Assessment Thoracic Assessment: Exceptions to WFL(kyphotic posture) Postural Control Postural Control: Deficits on evaluation  Balance Balance Balance Assessed: Yes Dynamic Standing Balance Dynamic Standing - Balance Support: During functional activity; bilateral upper extremity supported Dynamic Standing - Level of Assistance: 4: Min assist Dynamic Standing - Comments: gait w/ and w/o RW Extremity Assessment  RLE Assessment RLE Assessment: Exceptions to [facility] RLE Strength Right Knee Extension: 3+/5 LLE Assessment LLE Assessment: Exceptions to [facility] LLE Strength  Left Knee Extension: 4-/5   See Function Navigator for Current Functional Status.   Refer to Care Plan for Long Term Goals  Recommendations for other services: Therapeutic Recreation  Outing/community reintegration  Discharge Criteria: Patient will be discharged from PT if patient refuses treatment 3 consecutive times without medical reason, if treatment goals not met, if there is a change in medical status, if patient makes no progress towards goals or if patient is discharged from hospital.  The above assessment, treatment plan, treatment alternatives and goals were discussed and mutually agreed upon: by patient and by family  Caffie Damme 02/25/2018, 12:17 PM

## 2018-02-25 NOTE — Evaluation (Addendum)
Speech Language Pathology Assessment and Plan  Patient Details  Name: Ryan Savage MRN: [id number] Date of Birth: [birth year]  SLP Diagnosis: Cognitive Impairments  Rehab Potential: Good ELOS: 4/25    Today's Date: 02/25/2018 SLP Individual Time: 0258-5277 SLP Individual Time Calculation (min): 60 min   Problem List:  Patient Active Problem List   Diagnosis Date Noted  . Traumatic subdural hematoma ([place]) 02/24/2018  . Acute blood loss anemia   . Steroid-induced hyperglycemia   . 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 Chronic systolic CHF (congestive heart failure), NYHA class 2 ([place]) 02/20/2018  . Hypertension 02/20/2018  . Chronic atrial fibrillation ([place]) 02/20/2018  . Coronary artery disease 02/20/2018  . Mild mitral valve regurgitation 02/20/2018  . Subdural hematoma ([place]) 02/20/2018  . [name] (coronary artery disease), native coronary artery   . SDH (subdural hematoma) ([place]) 01/25/2018  . Bilateral subdural hematomas ([place]) 01/23/2018  . Malnutrition of moderate degree 12/10/2017  . Cerebral embolism with cerebral infarction 12/09/2017  . Odynophagia 12/08/2017  . Syncope and collapse 12/08/2017  . Hyperlipidemia 12/08/2017  . OSA (obstructive sleep apnea) 12/08/2017  . Dysarthria 12/08/2017  . [name] (coronary artery disease) 12/08/2017  . Chronic systolic heart failure ([place]) 12/08/2017  . Syndrome of inappropriate ADH (SIADH) secretion ([place]) 12/08/2017  . SIADH (syndrome of inappropriate ADH production) ([place])   . Subclinical hyperthyroidism   . Hyponatremia with excess extracellular fluid volume 11/18/2017  . Hyperglycemia 11/18/2017  . Supratherapeutic INR 11/18/2017  . Mitral valve prolapse   . Permanent atrial fibrillation ([place]) 11/08/2013  . Benign essential HTN 11/08/2013  . Mitral regurgitation 11/08/2013   Past Medical History:  Past Medical History:  Diagnosis Date  . Atrial fibrillation, chronic (HCC)    Not on anticoagulation due to history of bilateral subdural bleeds  due to recurrent falls  . BPH (benign prostatic hypertrophy)   . [name] (coronary artery disease), native coronary artery    Severe two-vessel [name] with chronically occluded LAD and RCA with widely patent left main, intermediate, and left circumflex branches.  On medical management.  He has extensive collaterals.  . 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 fib with RVR  . [name] (degenerative joint disease)   . GERD (gastroesophageal reflux disease)   . Hyperlipidemia    LDL goal less then 100  . Hypertension   . Microscopic hematuria    [name]  . Mitral valve prolapse    with moderate MR  . Myocardial infarction [facility])    "was told 11/2017 that he'd had a heart attack; don't know when it was" (02/19/2018)  . OSA (obstructive sleep apnea)    intolerant with CPAP (02/19/2018)  . SDH (subdural hematoma) ([place]) 01/2018  . SIADH (syndrome of inappropriate ADH production) ([place])   . Skin cancer    "side of my nose" (11/19/2017)  . Stroke ([place]) 12/2017   "fully recovered"  . Thrombocytopenia (HCC)    Mild- platelet count 143,000 on 02/2011, stable 08/2011   Past Surgical History:  Past Surgical History:  Procedure Laterality Date  . APPENDECTOMY  1950  . BACK SURGERY    . BURR HOLE Bilateral 01/25/2018   Procedure: Haskell Flirt;  Surgeon: Kary Kos, MD;  Location: [facility];  Service: Neurosurgery;  Laterality: Bilateral;  . BURR HOLE Left 02/20/2018   Procedure: BURR HOLES for subdural hematoma;  Surgeon: Newman Pies,  MD;  Location: [facility];  Service: Neurosurgery;  Laterality: Left;  . FOREARM FRACTURE SURGERY Right 1954  . LUMBAR DISC SURGERY  1959   ruptured disc repair  . RIGHT/LEFT HEART CATH AND CORONARY ANGIOGRAPHY N/A 11/21/2017   Procedure: RIGHT/LEFT HEART CATH AND CORONARY ANGIOGRAPHY;  Surgeon: Sherren Mocha, MD;  Location: [place] CV LAB;  Service: Cardiovascular;  Laterality: N/A;  . SKIN CANCER EXCISION Left    "side of my nose"    Assessment / Plan / Recommendation Clinical Impression   Ryan Thompsonis a 84 y.o.right-handed malewith history of atrial fibrillation, [name], chronic systolic congestive heart failure.History taken from chart review and patient.Patient with recent burr hole craniectomy left side chronic subdural hematoma 01/25/2018 and was later discharged home doing well. He lives with spouse. Ambulated without assistive device. Wife assisted with some lower body ADLs. Presented 02/19/2018 with recent falls and progressive neurologic decline. Noted some elevated blood pressures per home health aide. Noted hyponatremia 123. Follow-up CT reviewed, showing left SDH. Per report, recurrent and enlarging left side subdural hematoma. Underwent left bur hole for evacuation of hematoma 02/20/2018 per Dr. Arnoldo Morale.Decadron protocol as directed.Maintained on fluid restriction for hyponatremiawith latest sodium 125.No current plan for Declomycin at this time.Monitoring for any signs of fluid overload. His Lasixhas currently been placed on hold.In regards to his atrial fibrillation,not a candidate for anticoagulation due to subdural hematoma. Patient remains on [place] for seizure prophylaxis.Patient is tolerating a regular diet.Physicaland occupationaltherapy evaluationscompleted with recommendations of physical medicine rehab consult .Patient was admitted for a comprehensive rehab program.  SLP evaluation completed on 02/25/2018 with the following results:  Pt presents with mild cognitive-linguistic deficits characterized by decreased recall of information, decreased safety awareness, decreased emergent awareness of deficits, and decreased executive function skills.  As a result, pt needed min-mod assist to complete evaluation tasks and scored 21/30 on the MoCA (N>/=26).  Pt reports word  finding deficits; however, none evident in conversations and suspect difficulty to be related to slowed processing and cognitive deficits.  Given the abovementioned deficits, pt would benefit from skilled ST while inpatient in order to maximize functional independence and reduce burden care prior to discharge.  Anticipate that pt will need 24/7 supervision at discharge in addition to [place] follow up at next level of care.      Skilled Therapeutic Interventions          Cognitive-linguistic evaluation completed with results and recommendations reviewed with patient and family.     SLP Assessment  Patient will need skilled [place] Pathology Services during CIR admission    Recommendations  Recommendations for Other Services: Neuropsych consult Patient destination: Home Follow up Recommendations: Outpatient SLP;24 hour supervision/assistance Equipment Recommended: None recommended by SLP    SLP Frequency 3 to 5 out of 7 days   SLP Duration  SLP Intensity  SLP Treatment/Interventions 4/25  Minumum of 1-2 x/day, 30 to 90 minutes  Cognitive remediation/compensation;Cueing hierarchy;Internal/external aids;Environmental controls;Functional tasks;Patient/family education    Pain Pain Assessment Pain Scale: 0-10 Pain Score: 0-No pain  Prior Functioning Cognitive/Linguistic Baseline: Within functional limits(prior to multiple hospitalizations beginning in January of 2019) Type of Home: House  Lives With: Spouse Available Help at Discharge: Family;Available 24 hours/day Vocation: Retired  Function:  Eating Eating   Modified Consistency Diet: No Eating Assist Level: More than reasonable amount of time           Cognition Comprehension Comprehension assist level: Follows basic conversation/direction with no assist  Expression   Expression  assist level: Expresses basic needs/ideas: With no assist  Social Interaction Social Interaction assist level: Interacts appropriately  with others with medication or extra time (anti-anxiety, antidepressant).  Problem Solving Problem solving assist level: Solves basic 75 - 89% of the time/requires cueing 10 - 24% of the time  Memory Memory assist level: Recognizes or recalls 50 - 74% of the time/requires cueing 25 - 49% of the time   Short Term Goals: Week 1: SLP Short Term Goal 1 (Week 1): STG=LTG due to ELOS  Refer to Care Plan for Long Term Goals  Recommendations for other services: Neuropsych  Discharge Criteria: Patient will be discharged from SLP if patient refuses treatment 3 consecutive times without medical reason, if treatment goals not met, if there is a change in medical status, if patient makes no progress towards goals or if patient is discharged from hospital.  The above assessment, treatment plan, treatment alternatives and goals were discussed and mutually agreed upon: by patient and by family  Emilio Math 02/25/2018, 12:38 PM

## 2018-02-25 NOTE — Care Management Note (Signed)
Inpatient [facility] Individual Statement of Services  Patient Name:  Ryan Savage  Date:  02/25/2018  Welcome to the [facility].  Our goal is to provide you with an individualized program based on your diagnosis and situation, designed to meet your specific needs.  With this comprehensive rehabilitation program, you will be expected to participate in at least 3 hours of rehabilitation therapies Monday-Friday, with modified therapy programming on the weekends.  Your rehabilitation program will include the following services:  Physical Therapy (PT), Occupational Therapy (OT), Speech Therapy (ST), 24 hour per day rehabilitation nursing, Therapeutic Recreaction (TR), Case Management (Social Worker), Rehabilitation Medicine, Nutrition Services and Pharmacy Services  Weekly team conferences will be held on Wednesday to discuss your progress.  Your Social Worker will talk with you frequently to get your input and to update you on team discussions.  Team conferences with you and your family in attendance may also be held.  Expected length of stay: 7-10 days  Overall anticipated outcome: supervision level  Depending on your progress and recovery, your program may change. Your Social Worker will coordinate services and will keep you informed of any changes. Your Social Worker's name and contact numbers are listed  below.  The following services may also be recommended but are not provided by the [facility] will be made to provide these services after discharge if needed.  Arrangements include referral to agencies that provide these services.  Your insurance has been verified to be:  [employer]-Medicare Your primary doctor is:  Radio broadcast assistant  Pertinent information will be shared with your doctor and your insurance company.  Social  Worker:  Ovidio Kin, [place] or ([phone]  Information discussed with and copy given to patient by: Elease Hashimoto, 02/25/2018, 9:54 AM

## 2018-02-25 NOTE — Progress Notes (Signed)
Occupational Therapy Session Note  Patient Details  Name: Ryan Savage MRN: [id number] Date of Birth: [birth year]  Today's Date: 02/25/2018 OT Individual Time: 1430-1500 OT Individual Time Calculation (min): 30 min    Short Term Goals: Week 1:  OT Short Term Goal 1 (Week 1): STG=LTG due to LOS  Skilled Therapeutic Interventions/Progress Updates:    Upon entering the room, pt seated in recliner chair with wife present in room. Pt agreeable to OT intervention and with no c/o pain this session. Pt ambulating with RW and steady assistance for balance to [employer]. Pt engaging in dynavision task while standing for 2 minutes initially with use of RW and one UR supported . Pt at overall supervision level for task and able to hit 64 targets in 2 mins. Pt attempting while standing on foam wedge for 1 minute. Pt needing intermittent moments of steady assistance for balance but not significant LOB. Pt increasing reaction time and hitting 40 targets within 1 minute. Pt ambulated back to room in same manner as above. Pt doffed B shoes while seated on EOB and performed sit >supine with steady assistance. Bed alarm activated and call bell within reach upon exiting the room.   Therapy Documentation Precautions:  Precautions Precautions: Fall Restrictions Weight Bearing Restrictions: No General:   Vital Signs: Therapy Vitals Temp: 98.1 F (36.7 C) Pulse Rate: (!) 52 Resp: 18 BP: 114/69 Patient Position (if appropriate): Lying Oxygen Therapy SpO2: 97 % O2 Device: Room Air Pain: Pain Assessment Pain Scale: 0-10 Pain Score: 4  Pain Type: Acute pain Pain Location: Head(Sutures) Pain Orientation: Left Pain Intervention(s): Medication (See eMAR) ADL:   Vision   Perception    Praxis Praxis: Intact Exercises:   Other Treatments:    See Function Navigator for Current Functional Status.   Therapy/Group: Individual Therapy  [name] 02/25/2018, 4:22 PM

## 2018-02-25 NOTE — Evaluation (Signed)
Occupational Therapy Assessment and Plan  Patient Details  Name: Ryan Savage MRN: [id number] Date of Birth: 1929/08/05  OT Diagnosis: cognitive deficits and muscle weakness (generalized) Rehab Potential: Rehab Potential (ACUTE ONLY): Excellent ELOS: 7-10 days   Today's Date: 02/25/2018 OT Individual Time: 4782-9562 OT Individual Time Calculation (min): 66 min     Problem List:  Patient Active Problem List   Diagnosis Date Noted  . Traumatic subdural hematoma ([place]) 02/24/2018  . Acute blood loss anemia   . Steroid-induced hyperglycemia   . 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 Chronic systolic CHF (congestive heart failure), NYHA class 2 ([place]) 02/20/2018  . Hypertension 02/20/2018  . Chronic atrial fibrillation ([place]) 02/20/2018  . Coronary artery disease 02/20/2018  . Mild mitral valve regurgitation 02/20/2018  . Subdural hematoma ([place]) 02/20/2018  . [name] (coronary artery disease), native coronary artery   . SDH (subdural hematoma) ([place]) 01/25/2018  . Bilateral subdural hematomas ([place]) 01/23/2018  . Malnutrition of moderate degree 12/10/2017  . Cerebral embolism with cerebral infarction 12/09/2017  . Odynophagia 12/08/2017  . Syncope and collapse 12/08/2017  . Hyperlipidemia 12/08/2017  . OSA (obstructive sleep apnea) 12/08/2017  . Dysarthria 12/08/2017  . [name] (coronary artery disease) 12/08/2017  . Chronic systolic heart failure ([place]) 12/08/2017  . Syndrome of inappropriate ADH (SIADH) secretion ([place]) 12/08/2017  . SIADH (syndrome of inappropriate ADH production) ([place])   . Subclinical hyperthyroidism   . Hyponatremia with excess extracellular fluid volume 11/18/2017  . Hyperglycemia 11/18/2017  . Supratherapeutic INR 11/18/2017  . Mitral valve prolapse   . Permanent atrial fibrillation ([place]) 11/08/2013  . Benign essential HTN 11/08/2013  . Mitral regurgitation 11/08/2013    Past Medical History:  Past Medical History:  Diagnosis Date  . Atrial fibrillation, chronic (HCC)    Not  on anticoagulation due to history of bilateral subdural bleeds due to recurrent falls  . BPH (benign prostatic hypertrophy)   . [name] (coronary artery disease), native coronary artery    Severe two-vessel [name] with chronically occluded LAD and RCA with widely patent left main, intermediate, and left circumflex branches.  On medical management.  He has extensive collaterals.  . 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 fib with RVR  . [name] (degenerative joint disease)   . GERD (gastroesophageal reflux disease)   . Hyperlipidemia    LDL goal less then 100  . Hypertension   . Microscopic hematuria    [name]  . Mitral valve prolapse    with moderate MR  . Myocardial infarction [facility])    "was told 11/2017 that he'd had a heart attack; don't know when it was" (02/19/2018)  . OSA (obstructive sleep apnea)    intolerant with CPAP (02/19/2018)  . SDH (subdural hematoma) ([place]) 01/2018  . SIADH (syndrome of inappropriate ADH production) ([place])   . Skin cancer    "side of my nose" (11/19/2017)  . Stroke ([place]) 12/2017   "fully recovered"  . Thrombocytopenia (HCC)    Mild- platelet count 143,000 on 02/2011, stable 08/2011   Past Surgical History:  Past Surgical History:  Procedure Laterality Date  . APPENDECTOMY  1950  . BACK SURGERY    . BURR HOLE Bilateral 01/25/2018   Procedure: Haskell Flirt;  Surgeon: Kary Kos, MD;  Location: [facility];  Service: Neurosurgery;  Laterality: Bilateral;  . BURR HOLE Left 02/20/2018   Procedure:  BURR HOLES for subdural hematoma;  Surgeon: Newman Pies, MD;  Location: [facility];  Service: Neurosurgery;  Laterality: Left;  . FOREARM FRACTURE SURGERY Right 1954  . LUMBAR DISC SURGERY  1959   ruptured disc repair  . RIGHT/LEFT HEART CATH AND CORONARY ANGIOGRAPHY N/A 11/21/2017   Procedure: RIGHT/LEFT HEART CATH AND CORONARY  ANGIOGRAPHY;  Surgeon: Sherren Mocha, MD;  Location: [facility];  Service: Cardiovascular;  Laterality: N/A;  . SKIN CANCER EXCISION Left    "side of my nose"    Assessment & Plan Clinical Impression: Ryan Thompsonis a 84 y.o.right-handed malewith history of atrial fibrillation, [name], chronic systolic congestive heart failure.History taken from chart review and patient.Patient with recent burr hole craniectomy left side chronic subdural hematoma 01/25/2018 and was later discharged home doing well. He lives with spouse. Ambulated without assistive device. Wife assisted with some lower body ADLs. Presented 02/19/2018 with recent falls and progressive neurologic decline. Noted some elevated blood pressures per home health aide. Noted hyponatremia 123. Follow-up CT reviewed, showing left SDH. Per report, recurrent and enlarging left side subdural hematoma. Underwent left bur hole for evacuation of hematoma 02/20/2018 per Dr. Arnoldo Morale.Decadron protocol as directed.Maintained on fluid restriction for hyponatremiawith latest sodium 125.No current plan for Declomycin at this time.Monitoring for any signs of fluid overload. His Lasixhas currently been placed on hold.In regards to his atrial fibrillation,not a candidate for anticoagulation due to subdural hematoma. Patient remains on [place] for seizure prophylaxis.Patient is tolerating a regular diet.Physicaland occupationaltherapy evaluationscompleted with recommendations of physical medicine rehab consult .Patient was admitted for a comprehensive rehab program. Patient transferred to CIR on 02/24/2018 .    Patient currently requires min with basic self-care skills secondary to muscle weakness, decreased cardiorespiratoy endurance, decreased problem solving and decreased safety awareness and decreased standing balance, decreased postural control and decreased balance strategies.  Prior to hospitalization in December, patient  could complete ADLs/IADLs with independent . Pt was requiring minimal assist for ADLs from December to present with multiple falls during that time.   Patient will benefit from skilled intervention to decrease level of assist with basic self-care skills and increase independence with basic self-care skills prior to discharge home with care partner.  Anticipate patient will require 24 hour supervision and follow up home health.  OT - End of Session Activity Tolerance: Tolerates 10 - 20 min activity with multiple rests Endurance Deficit: Yes Endurance Deficit Description: Requires rest breaks throughout ADL tasks, unable to tolerate standing >1-2 minutes OT Assessment Rehab Potential (ACUTE ONLY): Excellent OT Patient demonstrates impairments in the following area(s): Balance;Cognition;Safety;Endurance;Motor OT Basic ADL's Functional Problem(s): Grooming;Bathing;Dressing;Toileting OT Transfers Functional Problem(s): Toilet;Tub/Shower OT Additional Impairment(s): None OT Plan OT Intensity: Minimum of 1-2 x/day, 45 to 90 minutes OT Frequency: 5 out of 7 days OT Duration/Estimated Length of Stay: 7-10 days OT Treatment/Interventions: Balance/vestibular training;Discharge planning;Pain management;Self Care/advanced ADL retraining;Therapeutic Activities;UE/LE Coordination activities;Therapeutic Exercise;Patient/family education;Functional mobility training;Disease mangement/prevention;Cognitive remediation/compensation;Community reintegration;DME/adaptive equipment instruction;Neuromuscular re-education;Psychosocial support;UE/LE Strength taining/ROM OT Self Feeding Anticipated Outcome(s): Independent OT Basic Self-Care Anticipated Outcome(s): Supervision OT Toileting Anticipated Outcome(s): Supervision OT Bathroom Transfers Anticipated Outcome(s): Supervision OT Recommendation Recommendations for Other Services: Therapeutic Recreation consult Therapeutic Recreation Interventions: Outing/community  reintergration Patient destination: Home Follow Up Recommendations: Home health OT;Outpatient OT Equipment Recommended: To be determined Equipment Details: Pt has BSC and shower chair   Skilled Therapeutic Intervention Pt seen for OT eval and ADL bathing/dressing session. Pt awake in supine upon arrival with wife and son present. Pt agreeable to tx session and  denying pain. He ambulated throughout session with RW, VCs for RW management throughout with max cuing when turning to sit.  Oral came completed standing at sink, intially min A for standing balance, however, progressing to mod A with fatigue with VCs for upright posture.  Pt with incontinent urination when in standing position, discussed timed toileting option due to urinary frequency and urgency. He bathed seated on tub bench, steadying assist when standing to complete buttock hygiene. He returned to sitting on toilet to dress. VCs for safety awareness as pt attempting to dry off and don LB clothing from standing position balancing on one LE. He returned to sitting EOB to don socks/shoes. Pt left seated EOB at end of session with nurse present and awaiting hand off to PT. Education provided to pt and family throughout session regarding IPR, role of OT, POC, OT/PT goals, continuum of care, DME and d/c planning.   OT Evaluation Precautions/Restrictions  Precautions Precautions: Fall Restrictions Weight Bearing Restrictions: No General Chart Reviewed: Yes Pain   No/denies pain Home Living/Prior Functioning Home Living Living Arrangements: Spouse/significant other Available Help at Discharge: Family, Available 24 hours/day Type of Home: House Home Access: Stairs to enter [employer] of Steps: 3+3 Entrance Stairs-Rails: Right, Left Home Layout: One level Bathroom Shower/Tub: Gaffer, Curtain, Tub/shower unit(Access to both) Biochemist, clinical: Programmer, systems: Yes  Lives With: Spouse IADL  History Homemaking Responsibilities: No Current License: Yes Mode of Transportation: Musician Occupation: Retired Prior Function Level of Independence: Independent with basic ADLs, Independent with homemaking with ambulation, Independent with gait, Independent with transfers  Able to Take Stairs?: Yes Driving: Yes(Was driving prior to Dec 2018 when medical events began to occur) Comments: Pt reports multiple falls and multiple admissions since January  Vision Baseline Vision/History: Wears glasses Wears Glasses: At all times Patient Visual Report: (Reports intermittent "shadows" in peripheral field which come and go) Vision Assessment?: No apparent visual deficits Perception  Perception: Within Functional Limits Praxis Praxis: Intact Cognition Overall Cognitive Status: Impaired/Different from baseline Arousal/Alertness: Awake/alert Orientation Level: Person;Place;Situation Person: Oriented Place: Oriented Situation: Oriented Year: 2019 Month: April Day of Week: Correct Memory: Impaired Memory Impairment: Decreased short term memory;Decreased recall of new information Decreased Short Term Memory: Verbal basic;Functional basic Immediate Memory Recall: Sock;Blue;Bed Memory Recall: Sock;Bed;Blue Memory Recall Sock: With Cue Memory Recall Blue: With Cue Memory Recall Bed: With Cue Awareness: Impaired Awareness Impairment: Emergent impairment Problem Solving: Impaired Problem Solving Impairment: Verbal complex;Functional complex Safety/Judgment: Impaired Comments: Decreased safety awareness and decreased awareness of deficits Sensation Sensation Light Touch: Appears Intact Proprioception: Appears Intact Coordination Gross Motor Movements are Fluid and Coordinated: No Fine Motor Movements are Fluid and Coordinated: Yes Coordination and Movement Description: Generalized weakness Motor  Motor Motor: Other (comment) Motor - Skilled Clinical Observations: Generalized weakness/  deconditioning Trunk/Postural Assessment  Cervical Assessment Cervical Assessment: Exceptions to WFL(Forward head) Thoracic Assessment Thoracic Assessment: Exceptions to WFL(Rounded shoulders; kyphotic) Lumbar Assessment Lumbar Assessment: Exceptions to WFL(Posterior pelvic tilt) Postural Control Postural Control: Deficits on evaluation  Balance Balance Balance Assessed: Yes Dynamic Sitting Balance Dynamic Sitting - Balance Support: During functional activity;Feet supported Dynamic Sitting - Level of Assistance: 5: Stand by assistance Sitting balance - Comments: Sitting during bathing/dressing tasks Static Standing Balance Static Standing - Balance Support: During functional activity;Right upper extremity supported;Left upper extremity supported Static Standing - Level of Assistance: 4: Min assist;3: Mod assist Static Standing - Comment/# of Minutes: Standing at sink to complete grooming tasks, progressed to mod A when fatigued Dynamic Standing  Balance Dynamic Standing - Balance Support: During functional activity;Right upper extremity supported;Left upper extremity supported Dynamic Standing - Level of Assistance: 3: Mod assist;4: Min assist Dynamic Standing - Comments: standing during LB bathing/dressing Extremity/Trunk Assessment RUE Assessment RUE Assessment: Exceptions to [facility] RUE Strength RUE Overall Strength: Deficits(4/5 strength throughout; grip WFL) LUE Assessment LUE Assessment: Within Functional Limits   See Function Navigator for Current Functional Status.   Refer to Care Plan for Long Term Goals  Recommendations for other services: Neuropsych and Therapeutic Recreation  Outing/community reintegration   Discharge Criteria: Patient will be discharged from OT if patient refuses treatment 3 consecutive times without medical reason, if treatment goals not met, if there is a change in medical status, if patient makes no progress towards goals or if patient is  discharged from hospital.  The above assessment, treatment plan, treatment alternatives and goals were discussed and mutually agreed upon: by patient and by family  Madoline Bhatt L 02/25/2018, 11:32 AM

## 2018-02-25 NOTE — Progress Notes (Signed)
Subjective/Complaints: Pt with chronic insomnia but no other c/o  ROS denies HA visual change, CP, SOB, + anorexia  Objective: Vital Signs: Blood pressure 102/64, pulse (!) 52, temperature 97.7 F (36.5 C), temperature source Oral, resp. rate 16, height 5' 10"  (1.778 m), weight 81.4 kg (179 lb 7.3 oz), SpO2 96 %. No results found. Results for orders placed or performed during the hospital encounter of 02/24/18 (from the past 72 hour(s))  CBC WITH DIFFERENTIAL     Status: Abnormal   Collection Time: 02/25/18  5:39 AM  Result Value Ref Range   WBC 9.6 4.0 - 10.5 K/uL   RBC 4.03 (L) 4.22 - 5.81 MIL/uL   Hemoglobin 12.7 (L) 13.0 - 17.0 g/dL   HCT 36.9 (L) 39.0 - 52.0 %   MCV 91.6 78.0 - 100.0 fL   MCH 31.5 26.0 - 34.0 pg   MCHC 34.4 30.0 - 36.0 g/dL   RDW 14.1 11.5 - 15.5 %   Platelets 169 150 - 400 K/uL   Neutrophils Relative % 77 %   Neutro Abs 7.4 1.7 - 7.7 K/uL   Lymphocytes Relative 17 %   Lymphs Abs 1.6 0.7 - 4.0 K/uL   Monocytes Relative 6 %   Monocytes Absolute 0.6 0.1 - 1.0 K/uL   Eosinophils Relative 0 %   Eosinophils Absolute 0.0 0.0 - 0.7 K/uL   Basophils Relative 0 %   Basophils Absolute 0.0 0.0 - 0.1 K/uL    Comment: Performed at [facility], [address] [place], [place] [address]  Comprehensive metabolic panel     Status: Abnormal   Collection Time: 02/25/18  5:39 AM  Result Value Ref Range   Sodium 124 (L) 135 - 145 mmol/L   Potassium 4.3 3.5 - 5.1 mmol/L   Chloride 90 (L) 101 - 111 mmol/L   CO2 27 22 - 32 mmol/L   Glucose, Bld 148 (H) 65 - 99 mg/dL   BUN 20 6 - 20 mg/dL   Creatinine, Ser 0.70 0.61 - 1.24 mg/dL   Calcium 8.2 (L) 8.9 - 10.3 mg/dL   Total Protein 5.1 (L) 6.5 - 8.1 g/dL   Albumin 2.6 (L) 3.5 - 5.0 g/dL   AST 21 15 - 41 U/L   ALT 12 (L) 17 - 63 U/L   Alkaline Phosphatase 49 38 - 126 U/L   Total Bilirubin 0.7 0.3 - 1.2 mg/dL   GFR calc non Af Amer >60 >60 mL/min   GFR calc Af Amer >60 >60 mL/min    Comment: (NOTE) The eGFR has  been calculated using the CKD EPI equation. This calculation has not been validated in all clinical situations. eGFR's persistently <60 mL/min signify possible Chronic Kidney Disease.    Anion gap 7 5 - 15    Comment: Performed at [facility] [address] [place], [place] [address]     HEENT: normal Cardio: RRR and no murmur Resp: CTA B/L and unlabored GI: BS positive and NT, ND Extremity:  No Edema Skin:   Wound C/D/I Neuro: Abnormal Sensory pt states LE LT sensation is equal, Abnormal Motor 4- R delt Bi Tri Grip, 5/5 in LUE, 4/5 BLE HF, KE <ADF and Other orietned to person and place, date not day Musc/Skel:  Other no pain with UE or LE ROM Gen NAD   Assessment/Plan: 1. Functional deficits secondary to Left SDH which require 3+ hours per day of interdisciplinary therapy in a comprehensive inpatient rehab setting. Physiatrist is providing  close team supervision and 24 hour management of active medical problems listed below. Physiatrist and rehab team continue to assess barriers to discharge/monitor patient progress toward functional and medical goals. FIM:                                  Medical Problem List and Plan: 1.Decreased functional mobilitysecondary toenlarging left subdural hematoma. 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  -CIR PT, OT, SLP 2. DVT Prophylaxis/Anticoagulation: SCDs. Monitor for any signs of DVT 3. Pain Management:Hydrocodone as needed 4. Mood:Provide emotional support 5. Neuropsych: This patientiscapable of making decisions on hisown behalf. 6. Skin/Wound Care:Routine skin checks 7. Fluids/Electrolytes/Nutrition:Routinein and outswith follow-up chemistries 8.Chronic systolic congestive heart failure. Monitor for any signs of fluid overload as Lasix is low dose  9.Chronic  atrial fibrillation. Not a candidate for anticoagulation due to SDH. Continue digoxin 0.125 mg daily as well as Toprol 25 mg daily 10.Seizure prophylaxis. Keppra 500 mg twice daily. 11.Hyponatremia. Fluid restriction. 1263mFollow-up chemistries in am Na+ remain low but stable, intake is not recorded- cont low dose Lasix 12.BPH. Pro-scar 5 mg daily. Check PVRs x3 13.Hyperlipidemia. Lipitor 14.Constipation. Laxative assistance 15.GERD. Protonix    LOS (Days) 1 A FACE TO FACE EVALUATION WAS PERFORMED  ACharlett Blake4/17/2019, 7:36 AM

## 2018-02-26 ENCOUNTER — Inpatient Hospital Stay (HOSPITAL_COMMUNITY): Payer: Medicare Other | Admitting: Speech Pathology

## 2018-02-26 ENCOUNTER — Inpatient Hospital Stay (HOSPITAL_COMMUNITY): Payer: Medicare Other | Admitting: Occupational Therapy

## 2018-02-26 ENCOUNTER — Inpatient Hospital Stay (HOSPITAL_COMMUNITY): Payer: Medicare Other

## 2018-02-26 LAB — BASIC METABOLIC PANEL
Anion gap: 9 (ref 5–15)
BUN: 22 mg/dL — ABNORMAL HIGH (ref 6–20)
CO2: 25 mmol/L (ref 22–32)
Calcium: 8.3 mg/dL — ABNORMAL LOW (ref 8.9–10.3)
Chloride: 92 mmol/L — ABNORMAL LOW (ref 101–111)
Creatinine, Ser: 0.67 mg/dL (ref 0.61–1.24)
GFR calc Af Amer: >60 mL/min (ref >60)
GFR calc non Af Amer: >60 mL/min (ref >60)
Glucose, Bld: 145 mg/dL — ABNORMAL HIGH (ref 65–99)
Potassium: 4.6 mmol/L (ref 3.5–5.1)
Sodium: 126 mmol/L — ABNORMAL LOW (ref 135–145)

## 2018-02-26 MED ORDER — DEXAMETHASONE 2 MG PO TABS
2.0000 mg | ORAL_TABLET | Freq: Three times a day (TID) | ORAL | Status: DC
  Administered 2018-02-26 – 2018-03-01 (×9): 2 mg via ORAL
  Filled 2018-02-26 (×9): qty 1

## 2018-02-26 MED ORDER — FUROSEMIDE 20 MG PO TABS
10.0000 mg | ORAL_TABLET | Freq: Every day | ORAL | Status: DC
  Administered 2018-02-26 – 2018-03-05 (×8): 10 mg via ORAL
  Filled 2018-02-26 (×9): qty 1

## 2018-02-26 MED ORDER — QUETIAPINE FUMARATE 25 MG PO TABS
25.0000 mg | ORAL_TABLET | Freq: Every day | ORAL | Status: DC
  Administered 2018-02-26 – 2018-03-01 (×4): 25 mg via ORAL
  Filled 2018-02-26 (×5): qty 1

## 2018-02-26 MED ORDER — PRO-STAT SUGAR FREE PO LIQD
30.0000 mL | Freq: Two times a day (BID) | ORAL | Status: DC
  Administered 2018-02-26 – 2018-03-05 (×15): 30 mL via ORAL
  Filled 2018-02-26 (×15): qty 30

## 2018-02-26 NOTE — Progress Notes (Signed)
Physical Therapy Note  Patient Details  Name: Sabatino Williard MRN: [id number] Date of Birth: [birth year] Today's Date: 02/26/2018  1030-1130, 60 min individual tx Pain: none per pt  Pt stated he needed to use toilet; toilet transfer for continent of B and B with assistance for diaper and pants.  Pt stated that he was slightly dizzy after BM.  PT educated pt on avoiding Valsalva maneuver.   Seated bil shoulder adduction x 15 to address forward shoulders.    neuromuscular re-education via forced use, multimodal cues for sustained stretch bil hamstrings and heel cords, and balance challenge, standing on wedge with bil UE support, x 2 min.  Seated hamstring stretch using footstool, R/L x 2 minutes each.  Educated pt and wife about long-term need to increase flexbility as a component of balance.  Instructed pt in hospital exercises for bil ankle pumps and bil quad sets when in recliner, to address flexibility.  Blocked practice sit>< stand focusing on forward wt shift, loading of bil LEs.  Gait to return to room, RW on level tile with min guard assist, cues for upright posture, forward gaze.    Pt transferred to recliner and LEs elevated.  Quick release belt donned, and seat alarm set.  Wife present.  See function navigator for current status.  Zakaria Sedor 02/26/2018, 7:49 AM

## 2018-02-26 NOTE — Progress Notes (Addendum)
Subjective/Complaints: Poor sleep, up 2-3 times urinating large amt  ROS denies HA visual change, CP, SOB, + anorexia  Objective: Vital Signs: Blood pressure 125/90, pulse 76, temperature 98 F (36.7 C), temperature source Oral, resp. rate 16, height 5' 10" (1.778 m), weight 82 kg (180 lb 12.4 oz), SpO2 98 %. No results found. Results for orders placed or performed during the hospital encounter of 02/24/18 (from the past 72 hour(s))  CBC WITH DIFFERENTIAL     Status: Abnormal   Collection Time: 02/25/18  5:39 AM  Result Value Ref Range   WBC 9.6 4.0 - 10.5 K/uL   RBC 4.03 (L) 4.22 - 5.81 MIL/uL   Hemoglobin 12.7 (L) 13.0 - 17.0 g/dL   HCT 36.9 (L) 39.0 - 52.0 %   MCV 91.6 78.0 - 100.0 fL   MCH 31.5 26.0 - 34.0 pg   MCHC 34.4 30.0 - 36.0 g/dL   RDW 14.1 11.5 - 15.5 %   Platelets 169 150 - 400 K/uL   Neutrophils Relative % 77 %   Neutro Abs 7.4 1.7 - 7.7 K/uL   Lymphocytes Relative 17 %   Lymphs Abs 1.6 0.7 - 4.0 K/uL   Monocytes Relative 6 %   Monocytes Absolute 0.6 0.1 - 1.0 K/uL   Eosinophils Relative 0 %   Eosinophils Absolute 0.0 0.0 - 0.7 K/uL   Basophils Relative 0 %   Basophils Absolute 0.0 0.0 - 0.1 K/uL    Comment: Performed at [facility], [address] [place], [place] [address]  Comprehensive metabolic panel     Status: Abnormal   Collection Time: 02/25/18  5:39 AM  Result Value Ref Range   Sodium 124 (L) 135 - 145 mmol/L   Potassium 4.3 3.5 - 5.1 mmol/L   Chloride 90 (L) 101 - 111 mmol/L   CO2 27 22 - 32 mmol/L   Glucose, Bld 148 (H) 65 - 99 mg/dL   BUN 20 6 - 20 mg/dL   Creatinine, Ser 0.70 0.61 - 1.24 mg/dL   Calcium 8.2 (L) 8.9 - 10.3 mg/dL   Total Protein 5.1 (L) 6.5 - 8.1 g/dL   Albumin 2.6 (L) 3.5 - 5.0 g/dL   AST 21 15 - 41 U/L   ALT 12 (L) 17 - 63 U/L   Alkaline Phosphatase 49 38 - 126 U/L   Total Bilirubin 0.7 0.3 - 1.2 mg/dL   GFR calc non Af Amer >60 >60 mL/min   GFR calc Af Amer >60 >60 mL/min    Comment: (NOTE) The eGFR has  been calculated using the CKD EPI equation. This calculation has not been validated in all clinical situations. eGFR's persistently <60 mL/min signify possible Chronic Kidney Disease.    Anion gap 7 5 - 15    Comment: Performed at [facility] [address] [place], [place] [address]     HEENT: normal Cardio: RRR and no murmur Resp: CTA B/L and unlabored GI: BS positive and NT, ND Extremity:  No Edema Skin:   Wound C/D/I Neuro: Abnormal Sensory pt states LE LT sensation is equal, Abnormal Motor 4- R delt Bi Tri Grip, 5/5 in LUE, 4/5 BLE HF, KE <ADF and Other orietned to person and place, date not day Musc/Skel:  Other no pain with UE or LE ROM Gen NAD   Assessment/Plan: 1. Functional deficits secondary to Left SDH which require 3+ hours per day of interdisciplinary therapy in a comprehensive inpatient rehab setting. Physiatrist is providing close  team supervision and 24 hour management of active medical problems listed below. Physiatrist and rehab team continue to assess barriers to discharge/monitor patient progress toward functional and medical goals. FIM: Function - Bathing Position: Shower Body parts bathed by patient: Right arm, Right lower leg, Left arm, Left lower leg, Chest, Abdomen, Front perineal area, Buttocks, Right upper leg, Left upper leg Body parts bathed by helper: Back Assist Level: Touching or steadying assistance(Pt > 75%)  Function- Upper Body Dressing/Undressing What is the patient wearing?: Pull over shirt/dress, Button up shirt Pull over shirt/dress - Perfomed by patient: Thread/unthread right sleeve, Thread/unthread left sleeve, Put head through opening, Pull shirt over trunk Button up shirt - Perfomed by patient: Thread/unthread right sleeve, Thread/unthread left sleeve, Pull shirt around back, Button/unbutton shirt Assist Level: Touching or steadying assistance(Pt > 75%) Function - Lower Body Dressing/Undressing What is the patient wearing?:  Underwear, Socks, Shoes, Pants Position: Wheelchair/chair at [employer] - Performed by patient: Thread/unthread right underwear leg, Thread/unthread left underwear leg, Pull underwear up/down Pants- Performed by patient: Thread/unthread right pants leg, Thread/unthread left pants leg, Pull pants up/down, Fasten/unfasten pants Socks - Performed by patient: Don/doff right sock, Don/doff left sock Shoes - Performed by helper: Don/doff right shoe, Don/doff left shoe, Fasten right, Fasten left Assist for footwear: Partial/moderate assist Assist for lower body dressing: Touching or steadying assistance (Pt > 75%)  Function - Toileting Toileting steps completed by patient: Adjust clothing prior to toileting, Adjust clothing after toileting Toileting steps completed by helper: Performs perineal hygiene Toileting Assistive Devices: Grab bar or rail Assist level: Touching or steadying assistance (Pt.75%)  Function - Air cabin crew transfer assistive device: Grab bar, Walker Assist level to toilet: Touching or steadying assistance (Pt > 75%) Assist level from toilet: Touching or steadying assistance (Pt > 75%)  Function - Chair/bed transfer Chair/bed transfer activity did not occur: Safety/medical concerns Chair/bed transfer assist level: Touching or steadying assistance (Pt > 75%) Chair/bed transfer assistive device: Walker  Function - Locomotion: Wheelchair Will patient use wheelchair at discharge?: No Wheelchair activity did not occur: Safety/medical concerns Wheel 50 feet with 2 turns activity did not occur: Safety/medical concerns Wheel 150 feet activity did not occur: Safety/medical concerns Function - Locomotion: Ambulation Assistive device: Walker-rolling Max distance: 36' Assist level: Touching or steadying assistance (Pt > 75%) Assist level: Touching or steadying assistance (Pt > 75%) Assist level: Touching or steadying assistance (Pt > 75%) Assist level: Touching or  steadying assistance (Pt > 75%) Walk 10 feet on uneven surfaces activity did not occur: Safety/medical concerns  Function - Comprehension Comprehension: Auditory Comprehension assist level: Follows basic conversation/direction with no assist  Function - Expression Expression: Verbal Expression assist level: Expresses basic needs/ideas: With no assist  Function - Social Interaction Social Interaction assist level: Interacts appropriately with others with medication or extra time (anti-anxiety, antidepressant).  Function - Problem Solving Problem solving assist level: Solves basic 75 - 89% of the time/requires cueing 10 - 24% of the time  Function - Memory Memory assist level: Recognizes or recalls 50 - 74% of the time/requires cueing 25 - 49% of the time Patient normally able to recall (first 3 days only): Location of own room, Staff names and faces, That he or she is in a hospital  Medical Problem List and Plan: 1.Decreased functional mobilitysecondary toenlarging left subdural hematoma. 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  -CIR PT, OT, SLP 2.  DVT Prophylaxis/Anticoagulation: SCDs. Monitor for any signs of DVT 3. Pain Management:Hydrocodone as needed 4. Mood:Provide emotional support 5. Neuropsych: This patientiscapable of making decisions on hisown behalf. 6. Skin/Wound Care:Routine skin checks 7. Fluids/Electrolytes/Nutrition:Routinein and outswith follow-up chemistries 8.Chronic systolic congestive heart failure. Monitor for any signs of fluid overload as Lasix is low dose but has neg fluid balance, will reduce further to 4m  9.Chronic atrial fibrillation. Not a candidate for anticoagulation due to SDH. Continue digoxin 0.125 mg daily as well as Toprol 25 mg daily 10.Seizure prophylaxis. Keppra 500 mg twice  daily. 11.Hyponatremia. Fluid restriction. 12013mut intake is lowerFollow-up chemistries in am Na+ remain low but stable, intake is not recorded- cont low dose Lasix I 480  O 120051m2.BPH. Pro-scar 5 mg daily. Check PVRs x3 13.Hyperlipidemia. Lipitor 14.Constipation. Laxative assistance 15.GERD. Protonix 16.  Hypoalb start prostat 17 insomnia chronic but worse in hosp , restless start low dose seroquel, some nocturia with high output Also on high dose decadron, per d/c summ medrol dose pack will taper to 2mg27mh LOS (Days) 2 A FACE TO FACE EVALUATION WAS PERFORMED  AndrCharlett Blake8/2019, 7:32 AM

## 2018-02-26 NOTE — Progress Notes (Signed)
Social Work Patient ID: Ryan Savage, male   DOB: [birth year], 84 y.o.   MRN: [id number]  Met with pt, wife and son who were here to see pt in therapies. Pt very concerned and cranky about not sleeping at night. He has spoken with the MD regarding this and he is adjusting medicines in hopes he will sleep tonight. Informed team conference goals supervision level and target discharge date 4/25. He wants to not require assist and if he stays this long he should reach these goals. Discussed OP versus HH follow up. Will continue to discuss this.

## 2018-02-26 NOTE — Progress Notes (Signed)
Occupational Therapy Session Note  Patient Details  Name: Ryan Savage MRN: [id number] Date of Birth: Jan 28, 1929  Today's Date: 02/26/2018 OT Individual Time: 4496-7591 OT Individual Time Calculation (min): 75 min    Short Term Goals: Week 1:  OT Short Term Goal 1 (Week 1): STG=LTG due to LOS  Skilled Therapeutic Interventions/Progress Updates:    Pt in supine upon arrival with RN present. Pt with complaints of having "rough night", not sleeping at all- MD is aware. Pt transferred to sitting EOB from flat bed in simulation of home environment with supervision. He ate breakfast seated EOB mod I using B UEs at functional level independently.  While pt eating breakfast, discussed POC, continuum of care, energy conservation, d/c planning, etc.  Pt ambulated throughout room with RW and CGA, 1 LOB crossing over bathroom threshold requiring min A to regain balance. Voiding completed standing at toilet with VCs for RW management over toilet. He gathered clothing items from closet with min A. He returned to sitting EOB to dress, transitioning to low couch to don socks and shoes for easier reach and what pt reports he has at home- t able to don B socks/shoes from this position with supervision. He completed grooming tasks standing at sink with CGA progressing to supervision assist demonstrating much improving functional activity tolerance and balance. Pt returned to sitting in recliner at end of session, QRB donned and all needs in reach.   Therapy Documentation Precautions:  Precautions Precautions: Fall Restrictions Weight Bearing Restrictions: No Pain:   No/denies pain  See Function Navigator for Current Functional Status.   Therapy/Group: Individual Therapy  Cing  L 02/26/2018, 6:54 AM

## 2018-02-26 NOTE — Progress Notes (Signed)
Speech Language Pathology Daily Session Note  Patient Details  Name: Ryan Savage MRN: [id number] Date of Birth: Oct 08, 1929  Today's Date: 02/26/2018   Skilled treatment #1 SLP Individual Time: 0930-1000 SLP Individual Time Calculation (min): 30 min   Skilled treatment #2 SLP Individual time: 8144-8185 SLP Individual time Calculation (min): 45 min  Short Term Goals: Week 1: SLP Short Term Goal 1 (Week 1): STG=LTG due to ELOS  Skilled Therapeutic Interventions: Skilled treatment session focused on cognition goals. SLP facilitated session by providing supervision cues for situation, intellectual awareness and problem solving basic money management tasks. Pt was left upright in recliner with safety belt donned and all needs within reach.   Skilled treatment session #1 focused on cognition goals. SLP facilitated session by providing Mod A for simple medication management task. Pt with significant difficulty understanding implication of twice daily. After activity, pt states that his wife does the medication (wife present and supports). Pt was returned to room, left upright in recliner with all needs within his reach, chair alarm on and wife present.       Function:    Cognition Comprehension Comprehension assist level: Understands complex 90% of the time/cues 10% of the time;Follows basic conversation/direction with no assist  Expression   Expression assist level: Expresses basic needs/ideas: With no assist;Expresses complex 90% of the time/cues < 10% of the time  Social Interaction Social Interaction assist level: Interacts appropriately with others with medication or extra time (anti-anxiety, antidepressant).  Problem Solving Problem solving assist level: Solves basic 90% of the time/requires cueing < 10% of the time  Memory Memory assist level: Recognizes or recalls 50 - 74% of the time/requires cueing 25 - 49% of the time;Recognizes or recalls 75 - 89% of the time/requires cueing 10  - 24% of the time    Pain Pain Assessment Pain Scale: 0-10 Pain Score: 0-No pain  Therapy/Group: Individual Therapy  Carson Bogden 02/26/2018, 10:36 AM

## 2018-02-27 ENCOUNTER — Inpatient Hospital Stay (HOSPITAL_COMMUNITY): Payer: Medicare Other | Admitting: Physical Therapy

## 2018-02-27 ENCOUNTER — Inpatient Hospital Stay (HOSPITAL_COMMUNITY): Payer: Medicare Other | Admitting: Occupational Therapy

## 2018-02-27 ENCOUNTER — Inpatient Hospital Stay (HOSPITAL_COMMUNITY): Payer: Medicare Other | Admitting: Speech Pathology

## 2018-02-27 ENCOUNTER — Inpatient Hospital Stay (HOSPITAL_COMMUNITY): Payer: Medicare Other

## 2018-02-27 MED ORDER — OXYBUTYNIN CHLORIDE 5 MG PO TABS
5.0000 mg | ORAL_TABLET | Freq: Every day | ORAL | Status: DC
  Administered 2018-02-27 – 2018-02-28 (×2): 5 mg via ORAL
  Filled 2018-02-27 (×2): qty 1

## 2018-02-27 NOTE — Progress Notes (Signed)
Physical Therapy Session Note  Patient Details  Name: Ryan Savage MRN: [id number] Date of Birth: June 15, 1929  Today's Date: 02/27/2018 PT Individual Time: 1415-1445 PT Individual Time Calculation (min): 30 min   Short Term Goals: Week 1:  PT Short Term Goal 1 (Week 1): Pt will negotiate 6 stairs w/ single rail and supervision PT Short Term Goal 2 (Week 1): Pt will ambulate >150 ft w/ supervision and LRAD PT Short Term Goal 3 (Week 1): Pt will perform bed mobility with supervision  Skilled Therapeutic Interventions/Progress Updates:   Pt received supine in bed and agreeable to PT. Supine>sit transfer with supervision assist and min cues for use of bed features. Pt reports need to urination. Sit<>stand with supervision assist with UE support on bed rail to use urinal EOB in standing. Pt able to maintain standing balance while Toiletting with supervision assist. ,   Stand pivot transfers to and from [facility] with supervision assist and min-mod cues for proper UE placement to improve safety and ease of transfer.    Nustep reciprocal movement training x 10 minutes with supervision assist from PT and min cues for proper speed to prevent excessive fatigue. Borg RPE 15/20 upon completion. .   Pt returned to room and performed stand pivot transfer to bed with supervision assist. Sit>supine completed with supervision assist, and left supine in bed with call bell in reach and all needs met.       Therapy Documentation Precautions:  Precautions Precautions: Fall Restrictions Weight Bearing Restrictions: No    Pain: Pain Assessment Pain Scale: 0-10 Pain Score: 0-No pain   See Function Navigator for Current Functional Status.   Therapy/Group: Individual Therapy  Lorie Phenix 02/27/2018, 3:10 PM

## 2018-02-27 NOTE — Progress Notes (Signed)
Occupational Therapy Session Note  Patient Details  Name: Ryan Savage MRN: [id number] Date of Birth: [birth year]  Today's Date: 02/27/2018 OT Individual Time: 2947-6546 OT Individual Time Calculation (min): 60 min  and Today's Date: 02/27/2018 OT Missed Time: 15 Minutes Missed Time Reason: Patient fatigue   Short Term Goals: Week 1:  OT Short Term Goal 1 (Week 1): STG=LTG due to LOS  Skilled Therapeutic Interventions/Progress Updates:    Pt seen for OT ADL bathing/dressing session. Pt asleep in recliner upon arrival, easily awoken and agreeable to tx session. He denied pain, however, cont with complaints of poor sleep at night and feeling tired during the day. He reports MD is working to change meds to assist with better sleep. He ambulated throughout room with close supervision using RW. He gathered clothing items from closet with min cues for RW management in functional context. Pt bathed seated on tub bench, standing with use of grab bars to cmplete pericare/buttock hygiene. HE returned to low couch in room to dress in simulation of home environment. He required min A to stand from low surface, however, able to maintain dynamic balance while pulling pants up with close supervision. With slightly increased time, pt able to don B socks/shoes independently.  Pt required extended seated rest break following bathing/dressing. During that time, extensive education provided regarding energy conservation, return to activity 3:1 hospital stay to recovery ration, and d/c planning. Following seated rst break, pt completed grooming  tasks standing at sink with supervision.  Pt requesting to end session early in order to rest before next tx session. He returned to recliner, set-up with QRB donned chair alarm on and all needs in reach.   Therapy Documentation Precautions:  Precautions Precautions: Fall Restrictions Weight Bearing Restrictions: No Pain:   No/denies pain  See Function Navigator for  Current Functional Status.   Therapy/Group: Individual Therapy  Reshonda Koerber L 02/27/2018, 7:05 AM

## 2018-02-27 NOTE — Progress Notes (Signed)
Speech Language Pathology Daily Session Note  Patient Details  Name: Ryan Savage MRN: [id number] Date of Birth: 1929/11/10  Today's Date: 02/27/2018 SLP Individual Time: 2637-8588 SLP Individual Time Calculation (min): 40 min  Short Term Goals: Week 1: SLP Short Term Goal 1 (Week 1): STG=LTG due to ELOS  Skilled Therapeutic Interventions:   Pt was seen for skilled ST targeting cognitive goals.  Pt very tired today due to ongoing difficulty sleeping at night time.  Pt was pleasant and agreeable to participate in therapy but understandably frustrated by lack of sleep.  Pt very eager to return home; therefore, SLP facilitated the session with structured conversations regarding safe discharge planning to address problem solving goals.  Pt identified barriers to discharge with min-supervision assist.  Pt at times became tangential in his thinking and needed min verbal cues for redirection.  Pt was returned to room and left in recliner with chair alarm set, quick release belt donned, and call bell within reach.  Continue per current plan of care.      Function:  Eating Eating                 Cognition Comprehension Comprehension assist level: Understands complex 90% of the time/cues 10% of the time  Expression   Expression assist level: Expresses complex 90% of the time/cues < 10% of the time  Social Interaction Social Interaction assist level: Interacts appropriately with others with medication or extra time (anti-anxiety, antidepressant).  Problem Solving Problem solving assist level: Solves basic 90% of the time/requires cueing < 10% of the time  Memory Memory assist level: Recognizes or recalls 75 - 89% of the time/requires cueing 10 - 24% of the time    Pain Pain Assessment Pain Scale: 0-10 Pain Score: 0-No pain  Therapy/Group: Individual Therapy  Shanyce Daris, Selinda Orion 02/27/2018, 12:15 PM

## 2018-02-27 NOTE — Progress Notes (Signed)
Staples removed per order. Patient tolerated comfortably.

## 2018-02-27 NOTE — Progress Notes (Signed)
Subjective/Complaints: Pt slept poorly due to nocturia discussed decreased dose of lasix and lower UO Discussed meds for bladder Using condom cath per nursing at noc  ROS denies HA visual change, CP, SOB, + anorexia  Objective: Vital Signs: Blood pressure 126/83, pulse 81, temperature 97.6 F (36.4 C), temperature source Oral, resp. rate 18, height 5' 10"  (1.778 m), weight 77.2 kg (170 lb 3.1 oz), SpO2 95 %. No results found. Results for orders placed or performed during the hospital encounter of 02/24/18 (from the past 72 hour(s))  CBC WITH DIFFERENTIAL     Status: Abnormal   Collection Time: 02/25/18  5:39 AM  Result Value Ref Range   WBC 9.6 4.0 - 10.5 K/uL   RBC 4.03 (L) 4.22 - 5.81 MIL/uL   Hemoglobin 12.7 (L) 13.0 - 17.0 g/dL   HCT 36.9 (L) 39.0 - 52.0 %   MCV 91.6 78.0 - 100.0 fL   MCH 31.5 26.0 - 34.0 pg   MCHC 34.4 30.0 - 36.0 g/dL   RDW 14.1 11.5 - 15.5 %   Platelets 169 150 - 400 K/uL   Neutrophils Relative % 77 %   Neutro Abs 7.4 1.7 - 7.7 K/uL   Lymphocytes Relative 17 %   Lymphs Abs 1.6 0.7 - 4.0 K/uL   Monocytes Relative 6 %   Monocytes Absolute 0.6 0.1 - 1.0 K/uL   Eosinophils Relative 0 %   Eosinophils Absolute 0.0 0.0 - 0.7 K/uL   Basophils Relative 0 %   Basophils Absolute 0.0 0.0 - 0.1 K/uL    Comment: Performed at [facility], [address], [place], [place] [address]  Comprehensive metabolic panel     Status: Abnormal   Collection Time: 02/25/18  5:39 AM  Result Value Ref Range   Sodium 124 (L) 135 - 145 mmol/L   Potassium 4.3 3.5 - 5.1 mmol/L   Chloride 90 (L) 101 - 111 mmol/L   CO2 27 22 - 32 mmol/L   Glucose, Bld 148 (H) 65 - 99 mg/dL   BUN 20 6 - 20 mg/dL   Creatinine, Ser 0.70 0.61 - 1.24 mg/dL   Calcium 8.2 (L) 8.9 - 10.3 mg/dL   Total Protein 5.1 (L) 6.5 - 8.1 g/dL   Albumin 2.6 (L) 3.5 - 5.0 g/dL   AST 21 15 - 41 U/L   ALT 12 (L) 17 - 63 U/L   Alkaline Phosphatase 49 38 - 126 U/L   Total Bilirubin 0.7 0.3 - 1.2 mg/dL   GFR  calc non Af Amer >60 >60 mL/min   GFR calc Af Amer >60 >60 mL/min    Comment: (NOTE) The eGFR has been calculated using the CKD EPI equation. This calculation has not been validated in all clinical situations. eGFR's persistently <60 mL/min signify possible Chronic Kidney Disease.    Anion gap 7 5 - 15    Comment: Performed at [facility] [address] [place], [place] [address]  Basic metabolic panel     Status: Abnormal   Collection Time: 02/26/18  6:04 AM  Result Value Ref Range   Sodium 126 (L) 135 - 145 mmol/L   Potassium 4.6 3.5 - 5.1 mmol/L   Chloride 92 (L) 101 - 111 mmol/L   CO2 25 22 - 32 mmol/L   Glucose, Bld 145 (H) 65 - 99 mg/dL   BUN 22 (H) 6 - 20 mg/dL   Creatinine, Ser 0.67 0.61 - 1.24 mg/dL   Calcium 8.3 (L) 8.9 -  10.3 mg/dL   GFR calc non Af Amer >60 >60 mL/min   GFR calc Af Amer >60 >60 mL/min    Comment: (NOTE) The eGFR has been calculated using the CKD EPI equation. This calculation has not been validated in all clinical situations. eGFR's persistently <60 mL/min signify possible Chronic Kidney Disease.    Anion gap 9 5 - 15    Comment: Performed at [facility] [address] [place], [place] [address]     HEENT: normal Cardio: RRR and no murmur Resp: CTA B/L and unlabored GI: BS positive and NT, ND Extremity:  No Edema Skin:   Wound C/D/I Neuro: Abnormal Sensory pt states LE LT sensation is equal, Abnormal Motor 4- R delt Bi Tri Grip, 5/5 in LUE, 4/5 BLE HF, KE <ADF and Other orietned to person and place, date not day Musc/Skel:  Other no pain with UE or LE ROM Gen NAD   Assessment/Plan: 1. Functional deficits secondary to Left SDH which require 3+ hours per day of interdisciplinary therapy in a comprehensive inpatient rehab setting. Physiatrist is providing close team supervision and 24 hour management of active medical problems listed below. Physiatrist and rehab team continue to assess barriers to discharge/monitor patient  progress toward functional and medical goals. FIM: Function - Bathing Position: Shower Body parts bathed by patient: Right arm, Right lower leg, Left arm, Left lower leg, Chest, Abdomen, Front perineal area, Buttocks, Right upper leg, Left upper leg Body parts bathed by helper: Back Assist Level: Touching or steadying assistance(Pt > 75%)  Function- Upper Body Dressing/Undressing What is the patient wearing?: Pull over shirt/dress, Button up shirt Pull over shirt/dress - Perfomed by patient: Thread/unthread right sleeve, Thread/unthread left sleeve, Put head through opening, Pull shirt over trunk Button up shirt - Perfomed by patient: Thread/unthread right sleeve, Thread/unthread left sleeve, Pull shirt around back, Button/unbutton shirt Assist Level: Touching or steadying assistance(Pt > 75%) Function - Lower Body Dressing/Undressing What is the patient wearing?: Underwear, Socks, Shoes, Pants Position: Sitting EOB Underwear - Performed by patient: Thread/unthread right underwear leg, Thread/unthread left underwear leg, Pull underwear up/down Pants- Performed by patient: Thread/unthread right pants leg, Thread/unthread left pants leg, Pull pants up/down, Fasten/unfasten pants Socks - Performed by patient: Don/doff right sock, Don/doff left sock Shoes - Performed by patient: Don/doff right shoe, Don/doff left shoe, Fasten right, Fasten left Shoes - Performed by helper: Don/doff right shoe, Don/doff left shoe, Fasten right, Fasten left Assist for footwear: Supervision/touching assist Assist for lower body dressing: Touching or steadying assistance (Pt > 75%)  Function - Toileting Toileting steps completed by patient: Adjust clothing prior to toileting, Adjust clothing after toileting, Performs perineal hygiene Toileting steps completed by helper: Performs perineal hygiene, Adjust clothing after toileting Toileting Assistive Devices: Grab bar or rail Assist level: Touching or steadying  assistance (Pt.75%)  Function - Air cabin crew transfer assistive device: Grab bar, Walker Assist level to toilet: Touching or steadying assistance (Pt > 75%) Assist level from toilet: Touching or steadying assistance (Pt > 75%)  Function - Chair/bed transfer Chair/bed transfer activity did not occur: Safety/medical concerns Chair/bed transfer method: Stand pivot Chair/bed transfer assist level: Touching or steadying assistance (Pt > 75%) Chair/bed transfer assistive device: Walker, Armrests Chair/bed transfer details: Manual facilitation for weight shifting, Verbal cues for safe use of DME/AE  Function - Locomotion: Wheelchair Will patient use wheelchair at discharge?: No Wheelchair activity did not occur: Safety/medical concerns Wheel 50 feet with 2 turns activity did not occur: Safety/medical  concerns Wheel 150 feet activity did not occur: Safety/medical concerns Function - Locomotion: Ambulation Assistive device: Walker-rolling Max distance: 90 Assist level: Touching or steadying assistance (Pt > 75%) Assist level: Touching or steadying assistance (Pt > 75%) Assist level: Touching or steadying assistance (Pt > 75%) Assist level: Touching or steadying assistance (Pt > 75%) Walk 10 feet on uneven surfaces activity did not occur: Safety/medical concerns  Function - Comprehension Comprehension: Auditory Comprehension assist level: Understands complex 90% of the time/cues 10% of the time, Follows basic conversation/direction with no assist  Function - Expression Expression: Verbal Expression assist level: Expresses basic needs/ideas: With no assist, Expresses complex 90% of the time/cues < 10% of the time  Function - Social Interaction Social Interaction assist level: Interacts appropriately with others with medication or extra time (anti-anxiety, antidepressant).  Function - Problem Solving Problem solving assist level: Solves basic 90% of the time/requires cueing <  10% of the time  Function - Memory Memory assist level: Recognizes or recalls 50 - 74% of the time/requires cueing 25 - 49% of the time Patient normally able to recall (first 3 days only): Location of own room, Staff names and faces, That he or she is in a hospital, Current season  Medical Problem List and Plan: 1.Decreased functional mobilitysecondary toenlarging left subdural hematoma. 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  -CIR PT, OT, SLP 2. DVT Prophylaxis/Anticoagulation: SCDs. Monitor for any signs of DVT 3. Pain Management:Hydrocodone as needed 4. Mood:Provide emotional support 5. Neuropsych: This patientiscapable of making decisions on hisown behalf. 6. Skin/Wound Care:Routine skin checks 7. Fluids/Electrolytes/Nutrition:Routinein and outswith follow-up chemistries 8.Chronic systolic congestive heart failure. Monitor for any signs of fluid overload as Lasix is low dose but has neg fluid balance, will reduce further to 104m  9.Chronic atrial fibrillation. Not a candidate for anticoagulation due to SDH. Continue digoxin 0.125 mg daily as well as Toprol 25 mg daily 10.Seizure prophylaxis. Keppra 500 mg twice daily. 11.Hyponatremia. Fluid restriction. 1200but intake is ~5056mFollow-up chemistries  Na+ slighly improved 126 cont low dose Lasix  12.BPH. Pro-scar 5 mg daily. Trial oxybutnin at noc Check PVRs x3 13.Hyperlipidemia. Lipitor 14.Constipation. Laxative assistance 15.GERD. Protonix 16.  Hypoalb start prostat 17 insomnia chronic but worse in hosp , restless start low dose seroquel, some nocturia with high output Also on high dose decadron, per d/c summ medrol dose pack will taper to 11m25m8h LOS (Days) 3 A FACE TO FACE EVALUATION WAS PERFORMED  AndCharlett Blake19/2019, 7:22 AM

## 2018-02-27 NOTE — Progress Notes (Signed)
Pt c/o of frequent urination disrupting sleep and expressed frustration regarding the need to toilet multiple times during the night; RN discussed use of condom cath at HS to decrease sleep disruption; pt expressed willingness to proceed with intervention; scheduled lasix; will continue to monitor

## 2018-02-27 NOTE — IPOC Note (Signed)
Overall Plan of Care [facility]) Patient Details Name: Ryan Savage MRN: [id number] DOB: 1929-07-28  Admitting Diagnosis: <principal problem not specified>  Hospital Problems: Active Problems:   Traumatic subdural hematoma ([place])     Functional Problem List: Nursing Bladder, Bowel, Safety, Medication Management, Sensory  PT Balance, Endurance, Motor, Safety  OT Balance, Cognition, Safety, Endurance, Motor  SLP Cognition  TR         Basic ADL's: OT Grooming, Bathing, Dressing, Toileting     Advanced  ADL's: OT       Transfers: PT    OT Toilet, Tub/Shower     Locomotion: PT       Additional Impairments: OT None  SLP Social Cognition   Problem Solving, Memory, Attention, Awareness  TR      Anticipated Outcomes Item Anticipated Outcome  Self Feeding Independent  Swallowing      Basic self-care  Supervision  Toileting  Supervision   Bathroom Transfers Supervision  Bowel/Bladder  B+B Managed with mod I assist  Transfers     Locomotion     Communication     Cognition  Supervision   Pain     Safety/Judgment  managed safety with reminders, cues   Therapy Plan: PT Intensity: Minimum of 1-2 x/day ,45 to 90 minutes PT Frequency: 5 out of 7 days PT Duration Estimated Length of Stay: 7-10 days OT Intensity: Minimum of 1-2 x/day, 45 to 90 minutes OT Frequency: 5 out of 7 days OT Duration/Estimated Length of Stay: 7-10 days SLP Intensity: Minumum of 1-2 x/day, 30 to 90 minutes SLP Frequency: 3 to 5 out of 7 days SLP Duration/Estimated Length of Stay: 4/25    Team Interventions: Nursing Interventions Patient/Family Education, Disease Management/Prevention, Discharge Planning, Bladder Management, Psychosocial Support, Bowel Management, Medication Management  PT interventions Ambulation/gait training, Patient/family education, Neuromuscular re-education, Stair training, Therapeutic Exercise, UE/LE Coordination activities, Disease management/prevention,  Academic librarian, Training and development officer, Functional mobility training, DME/adaptive equipment instruction, Discharge planning, Psychosocial support, Therapeutic Activities, UE/LE Strength taining/ROM, Visual/perceptual remediation/compensation  OT Interventions Balance/vestibular training, Discharge planning, Pain management, Self Care/advanced ADL retraining, Therapeutic Activities, UE/LE Coordination activities, Therapeutic Exercise, Patient/family education, Functional mobility training, Disease mangement/prevention, Cognitive remediation/compensation, Academic librarian, Engineer, drilling, Neuromuscular re-education, Psychosocial support, UE/LE Strength taining/ROM  SLP Interventions Cognitive remediation/compensation, English as a second language teacher, Internal/external aids, Environmental controls, Functional tasks, Patient/family education  TR Interventions    SW/CM Interventions Discharge Planning, Psychosocial Support, Patient/Family Education   Barriers to Discharge MD  Medical stability  Nursing      PT Decreased caregiver support    OT      SLP      SW Decreased caregiver support Wife limited with assist   Team Discharge Planning: Destination: PT-Home ,OT- Home , SLP-Home Projected Follow-up: PT-Home health PT, OT-  Home health OT, Outpatient OT, SLP-Outpatient SLP, 24 hour supervision/assistance Projected Equipment Needs: PT-Rolling walker with 5" wheels, OT- To be determined, SLP-None recommended by SLP Equipment Details: PT-Pt's wife reports pt has three walkers at home , OT-Pt has BSC and shower chair Patient/family involved in discharge planning: PT-  ,  OT-Patient, Family member/caregiver, SLP-Patient, Family member/caregiver  MD ELOS: 7-10d Medical Rehab Prognosis:  Good Assessment:  84 y.o.right-handed malewith history of atrial fibrillation, [name], chronic systolic congestive heart failure.History taken from chart review and patient.Patient with  recent burr hole craniectomy left side chronic subdural hematoma 01/25/2018 and was later discharged home doing well. He lives with spouse. Ambulated without assistive device. Wife assisted with some lower body ADLs. Presented  02/19/2018 with recent falls and progressive neurologic decline. Noted some elevated blood pressures per home health aide. Noted hyponatremia 123. Follow-up CT reviewed, showing left SDH. Per report, recurrent and enlarging left side subdural hematoma. Underwent left bur hole for evacuation of hematoma 02/20/2018 per Dr. Arnoldo Morale.Decadron protocol as directed.Maintained on fluid restriction for hyponatremiawith latest sodium 125.No current plan for Declomycin at this time.Monitoring for any signs of fluid overload. His Lasixhas currently been placed on hold.In regards to his atrial fibrillation,not a candidate for anticoagulation due to subdural hematoma. Patient remains on [place] for seizure prophylaxis  Now requiring 24/7 Rehab RN,MD, as well as CIR level PT, OT and SLP.  Treatment team will focus on ADLs and mobility with goals set at Supervision  See Team Conference Notes for weekly updates to the plan of care

## 2018-02-27 NOTE — Progress Notes (Signed)
Physical Therapy Session Note  Patient Details  Name: Ryan Savage MRN: [id number] Date of Birth: Dec 16, 1928  Today's Date: 02/27/2018 PT Individual Time: 1115-1200 PT Individual Time Calculation (min): 45 min   Short Term Goals: Week 1:  PT Short Term Goal 1 (Week 1): Pt will negotiate 6 stairs w/ single rail and supervision PT Short Term Goal 2 (Week 1): Pt will ambulate >150 ft w/ supervision and LRAD PT Short Term Goal 3 (Week 1): Pt will perform bed mobility with supervision  Skilled Therapeutic Interventions/Progress Updates:   Pt complaining of not sleeping last several days but agreeable to try this therapy session to tolerance. Sit <> stand retraining with cues for decreased UE support and technique. Focused on functional gait on unit with cues for positioning of RW and overall LE strengthening and muscular endurance and safety with mobility with AD. Nustep x 10 min on level 6 for functional strengthening. NMR for balance and heel cord stretching with wedge and no UE support x 2 trials x 1 min each. Balance training with focus on ankle strategy activation and education on purpose of exercises. Functional transfers in room for toileting with close supervision to steadying assist with RW and returned to recliner end of session with safety measures intact.   Therapy Documentation Precautions:  Precautions Precautions: Fall Restrictions Weight Bearing Restrictions: No  Pain: Denies pain. Just complaints of fatigue due to   See Function Navigator for Current Functional Status.   Therapy/Group: Individual Therapy  Canary Brim Ivory Broad, PT, DPT  02/27/2018, 12:04 PM

## 2018-02-28 ENCOUNTER — Inpatient Hospital Stay (HOSPITAL_COMMUNITY): Payer: Medicare Other | Admitting: Occupational Therapy

## 2018-02-28 ENCOUNTER — Inpatient Hospital Stay (HOSPITAL_COMMUNITY): Payer: Medicare Other | Admitting: Speech Pathology

## 2018-02-28 ENCOUNTER — Inpatient Hospital Stay (HOSPITAL_COMMUNITY): Payer: Medicare Other | Admitting: Physical Therapy

## 2018-02-28 NOTE — Progress Notes (Addendum)
Occupational Therapy Session Note  Patient Details  Name: Ryan Savage MRN: [id number] Date of Birth: Feb 19, 1929  Today's Date: 02/28/2018 OT Individual Time: 1100-1156 and 1400-1430 OT Individual Time Calculation (min): 56 min and 30 min   Short Term Goals: Week 1:  OT Short Term Goal 1 (Week 1): STG=LTG due to LOS  Skilled Therapeutic Interventions/Progress Updates:    Session One: Pt seen for OT session focusing on functional ambulation/endurance and functional transfers. Pt sitting up in recliner upon arrival, agreeable to tx session and denying pain.  He ambulated throughout room and unit with close supervision, mod cuing for RW management in functional context. Toileting and grooming tasks completed with close supervision-CGA during standing portions. He ambulated to ADL apartment, no required rest break and demonstrating improved functional activity tolerance.  In ADL apartment, completed simulated tub/shower transfer x2 trials once using tub transfer bench and once stepping over tub wall to shower seat using grab bars to assist with balance. Educated regarding pros/cons of each method. Pt then stated he also has shower stall he could use. Practiced shower stall transfer with education and demonstration provided regarding technique for RW management in shower stall. Pt return demonstrated at supervision level. Pt voiced this was easist method and would do this at home. Will cont to practice in future sessions and recommend family training with caregivers prior to d/c as pt demonstrated some difficulty understand and then recalling information given by therapist. Pt returned to room in same manner as described above. Pt left seated in recliner with all needs in reach, chair alarm and QRB belt on.    Session Two: Pt seen for OT session focusing on family training with pt's wife in regards to shower stall transfer. Pt sitting up in recliner upon arrival with wife present, agreeable to tx  session. Pt desiring for therapist to review activities of AM session with his wife as she will be assisting at d/c. Pt ambulated to ADL apartment using RW with supervision. Pt able to correctly recall technique for stepping of shower stall threshold and completing transfer with close supervision. Pt then completed bed mobility on standard bed in simulation of home environment. Discussed use of BSC at bedside at night for toileting needs and reducing fall risk from walking to bathroom during the night. Pt completed stand pivot transfer EOB <> BSC supervision-mod I level. Pt returned to room in same manner as described above. Pt left seated in recliner with all needs in reach, chair alarm and QRB belt on and wife present.  Therapy Documentation Precautions:  Precautions Precautions: Fall Restrictions Weight Bearing Restrictions: No  See Function Navigator for Current Functional Status.   Therapy/Group: Individual Therapy  Kadan Millstein L 02/28/2018, 7:45 AM

## 2018-02-28 NOTE — Plan of Care (Signed)
  Problem: Consults Goal: RH BRAIN INJURY PATIENT EDUCATION Description Description: See Patient Education module for eduction specifics Outcome: Progressing   Problem: RH BOWEL ELIMINATION Goal: RH STG MANAGE BOWEL WITH ASSISTANCE Description STG Manage Bowel with MOD I Assistance.  Outcome: Progressing Goal: RH STG MANAGE BOWEL W/MEDICATION W/ASSISTANCE Description STG Manage Bowel with Medication with  Mod I Assistance.  Outcome: Progressing   Problem: RH BLADDER ELIMINATION Goal: RH STG MANAGE BLADDER WITH ASSISTANCE Description STG Manage Bladder With  Min Assistance  Outcome: Progressing Goal: RH STG MANAGE BLADDER WITH MEDICATION WITH ASSISTANCE Description STG Manage Bladder With Medication With Mod I Assistance.  Outcome: Progressing   Problem: RH SAFETY Goal: RH STG ADHERE TO SAFETY PRECAUTIONS W/ASSISTANCE/DEVICE Description STG Adhere to Safety Precautions With cues, reminders Assistance/Device.  Outcome: Progressing   Problem: RH KNOWLEDGE DEFICIT BRAIN INJURY Goal: RH STG INCREASE KNOWLEDGE OF SELF CARE AFTER BRAIN INJURY Description Pt and wife will be able to manage care after discharge using cues/reminders/handouts independently  Outcome: Progressing

## 2018-02-28 NOTE — Progress Notes (Signed)
Physical Therapy Session Note  Patient Details  Name: Ryan Savage MRN: [id number] Date of Birth: 02/21/1929  Today's Date: 02/28/2018 PT Individual Time: 1430-1530 PT Individual Time Calculation (min): 60 min   Short Term Goals: Week 1:  PT Short Term Goal 1 (Week 1): Pt will negotiate 6 stairs w/ single rail and supervision PT Short Term Goal 2 (Week 1): Pt will ambulate >150 ft w/ supervision and LRAD PT Short Term Goal 3 (Week 1): Pt will perform bed mobility with supervision  Skilled Therapeutic Interventions/Progress Updates:    Pt seated in recliner in room, agreeable to PT. No complaints of pain. Ambulation 2 x 150 ft with RW and SBA, focus on heel/toe gait pattern to improve LE clearance and decrease shuffling. Ascend/descend 8 stairs with 2 handrails and min assist for balance, v/c for step-to gait pattern. Toilet transfer with min assist with use of grab bar and RW. Pt is able to complete 2/3 toileting steps (clothing management) with SBA for balance and requires assist for pericare. Standing BLE therex: mini-squats, HS curls, hip flex, marches x 10 reps each. Pt left seated in recliner in room with needs in reach, quick release belt and chair alarm in place, wife present.  Therapy Documentation Precautions:  Precautions Precautions: Fall Restrictions Weight Bearing Restrictions: No  See Function Navigator for Current Functional Status.   Therapy/Group: Individual Therapy  Excell Seltzer, PT, DPT  02/28/2018, 4:29 PM

## 2018-02-28 NOTE — Progress Notes (Signed)
Speech Language Pathology Daily Session Note  Patient Details  Name: Zacary Bauer MRN: [id number] Date of Birth: June 15, 1929  Today's Date: 02/28/2018 SLP Individual Time: 0930-1010 SLP Individual Time Calculation (min): 40 min  Short Term Goals: Week 1: SLP Short Term Goal 1 (Week 1): STG=LTG due to ELOS  Skilled Therapeutic Interventions: Skilled treatment session focused on cognitive goals. SLP facilitated session by providing Min A verbal cues for recall and problem solving during a basic money management task. Patient independently utilized his schedule to anticipate upcoming sessions and to recall events from previous sessions. Patient left upright in recliner with all needs within reach, quick release belt and alarm on. Continue with current plan of care.      Function:   Cognition Comprehension Comprehension assist level: Understands complex 90% of the time/cues 10% of the time  Expression   Expression assist level: Expresses complex 90% of the time/cues < 10% of the time  Social Interaction Social Interaction assist level: Interacts appropriately with others with medication or extra time (anti-anxiety, antidepressant).  Problem Solving Problem solving assist level: Solves basic 75 - 89% of the time/requires cueing 10 - 24% of the time  Memory Memory assist level: Recognizes or recalls 75 - 89% of the time/requires cueing 10 - 24% of the time    Pain No/Denies Pain   Therapy/Group: Individual Therapy  Estelita Iten 02/28/2018, 3:49 PM

## 2018-03-01 ENCOUNTER — Inpatient Hospital Stay (HOSPITAL_COMMUNITY): Payer: Medicare Other

## 2018-03-01 MED ORDER — OXYBUTYNIN CHLORIDE 5 MG PO TABS
10.0000 mg | ORAL_TABLET | Freq: Every day | ORAL | Status: DC
  Administered 2018-03-01: 10 mg via ORAL
  Filled 2018-03-01: qty 2

## 2018-03-01 MED ORDER — DEXAMETHASONE 2 MG PO TABS
2.0000 mg | ORAL_TABLET | Freq: Two times a day (BID) | ORAL | Status: DC
  Administered 2018-03-01 – 2018-03-04 (×6): 2 mg via ORAL
  Filled 2018-03-01 (×6): qty 1

## 2018-03-01 NOTE — Progress Notes (Signed)
Subjective/Complaints: Slept about 4 hr, +nocturia  ROS denies HA visual change, CP, SOB, + anorexia  Objective: Vital Signs: Blood pressure 101/86, pulse 60, temperature 98.1 F (36.7 C), temperature source Oral, resp. rate 19, height 5\' 10"  (1.778 m), weight 75.7 kg (166 lb 14.2 oz), SpO2 97 %. No results found. No results found for this or any previous visit (from the past 72 hour(s)).   HEENT: normal Cardio: RRR and no murmur Resp: CTA B/L and unlabored GI: BS positive and NT, ND Extremity:  No Edema Skin:   Wound C/D/I Neuro: Abnormal Sensory pt states LE LT sensation is equal, Abnormal Motor 4- R delt Bi Tri Grip, 5/5 in LUE, 4/5 BLE HF, KE <ADF and Other orietned to person and place, date not day Musc/Skel:  Other no pain with UE or LE ROM Gen NAD   Assessment/Plan: 1. Functional deficits secondary to Left SDH which require 3+ hours per day of interdisciplinary therapy in a comprehensive inpatient rehab setting. Physiatrist is providing close team supervision and 24 hour management of active medical problems listed below. Physiatrist and rehab team continue to assess barriers to discharge/monitor patient progress toward functional and medical goals. FIM: Function - Bathing Position: Shower Body parts bathed by patient: Right arm, Right lower leg, Left arm, Left lower leg, Chest, Abdomen, Front perineal area, Buttocks, Right upper leg, Left upper leg Body parts bathed by helper: Back Assist Level: Touching or steadying assistance(Pt > 75%)  Function- Upper Body Dressing/Undressing What is the patient wearing?: Pull over shirt/dress Pull over shirt/dress - Perfomed by patient: Thread/unthread right sleeve, Thread/unthread left sleeve, Put head through opening, Pull shirt over trunk Button up shirt - Perfomed by patient: Thread/unthread right sleeve, Thread/unthread left sleeve, Pull shirt around back, Button/unbutton shirt Assist Level: Supervision or verbal  cues Function - Lower Body Dressing/Undressing What is the patient wearing?: Underwear, Socks, Shoes, Pants Position: Other (comment)(low sleeper couch in room) Underwear - Performed by patient: Thread/unthread right underwear leg, Thread/unthread left underwear leg, Pull underwear up/down Pants- Performed by patient: Thread/unthread right pants leg, Thread/unthread left pants leg, Pull pants up/down, Fasten/unfasten pants Socks - Performed by patient: Don/doff right sock, Don/doff left sock Shoes - Performed by patient: Don/doff right shoe, Don/doff left shoe, Fasten right, Fasten left Shoes - Performed by helper: Don/doff right shoe, Don/doff left shoe, Fasten right, Fasten left Assist for footwear: Supervision/touching assist Assist for lower body dressing: Touching or steadying assistance (Pt > 75%)  Function - Toileting Toileting steps completed by patient: Adjust clothing prior to toileting, Adjust clothing after toileting Toileting steps completed by helper: Performs perineal hygiene Toileting Assistive Devices: Grab bar or rail Assist level: Touching or steadying assistance (Pt.75%)  Function - Air cabin crew transfer assistive device: Grab bar, Walker Assist level to toilet: Touching or steadying assistance (Pt > 75%) Assist level from toilet: Touching or steadying assistance (Pt > 75%)  Function - Chair/bed transfer Chair/bed transfer method: Ambulatory Chair/bed transfer assist level: Touching or steadying assistance (Pt > 75%) Chair/bed transfer assistive device: Armrests, Walker Chair/bed transfer details: Verbal cues for precautions/safety, Verbal cues for safe use of DME/AE, Verbal cues for technique  Function - Locomotion: Wheelchair Will patient use wheelchair at discharge?: No Wheelchair activity did not occur: Safety/medical concerns Wheel 50 feet with 2 turns activity did not occur: Safety/medical concerns Wheel 150 feet activity did not occur:  Safety/medical concerns Function - Locomotion: Ambulation Assistive device: Walker-rolling Max distance: 150' Assist level: Supervision or verbal cues Assist level: Supervision  or verbal cues Assist level: Supervision or verbal cues Walk 150 feet activity did not occur: Safety/medical concerns(fatigued) Assist level: Supervision or verbal cues Walk 10 feet on uneven surfaces activity did not occur: Safety/medical concerns  Function - Comprehension Comprehension: Auditory Comprehension assist level: Understands complex 90% of the time/cues 10% of the time  Function - Expression Expression: Verbal Expression assist level: Expresses complex 90% of the time/cues < 10% of the time  Function - Social Interaction Social Interaction assist level: Interacts appropriately with others with medication or extra time (anti-anxiety, antidepressant).  Function - Problem Solving Problem solving assist level: Solves basic 75 - 89% of the time/requires cueing 10 - 24% of the time  Function - Memory Memory assist level: Recognizes or recalls 75 - 89% of the time/requires cueing 10 - 24% of the time Patient normally able to recall (first 3 days only): Location of own room, Staff names and faces, That he or she is in a hospital, Current season  Medical Problem List and Plan: 1.Decreased functional mobilitysecondary toenlarging left subdural hematoma. 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  -CIR PT, OT, SLP 2. DVT Prophylaxis/Anticoagulation: SCDs. Monitor for any signs of DVT 3. Pain Management:Hydrocodone as needed 4. Mood:Provide emotional support 5. Neuropsych: This patientiscapable of making decisions on hisown behalf. 6. Skin/Wound Care:Routine skin checks 7. Fluids/Electrolytes/Nutrition:Routinein and outswith follow-up chemistries 8.Chronic  systolic congestive heart failure. Monitor for any signs of fluid overload as Lasix is low dose but has neg fluid balance, will cont further to 10mg   9.Chronic atrial fibrillation. Not a candidate for anticoagulation due to SDH. Continue digoxin 0.125 mg daily as well as Toprol 25 mg daily 10.Seizure prophylaxis. Keppra 500 mg twice daily. 11.Hyponatremia. Fluid restriction. 1200but intake is ~1519ml-d/w pt Follow-up chemistries  Na+ slighly improved 126 cont low dose Lasix  12.BPH. Pro-scar 5 mg daily. Increase  oxybutnin at noc Check PVRs x3 13.Hyperlipidemia. Lipitor 14.Constipation. Laxative assistance 15.GERD. Protonix 16.  Hypoalb start prostat 17 insomnia chronic but worse in hosp , restless start low dose seroquel, some nocturia with high output Also on high dose decadron, per d/c summ medrol dose pack will taper to 2mg  Q12h LOS (Days) 5 A FACE TO FACE EVALUATION WAS PERFORMED  Charlett Blake 03/01/2018, 7:37 AM

## 2018-03-01 NOTE — Progress Notes (Addendum)
Occupational Therapy Session Note  Patient Details  Name: Ryan Savage MRN: [id number] Date of Birth: 1929/09/30  Today's Date: 03/01/2018 OT Individual Time: 6384-5364 OT Individual Time Calculation (min): 27 min    Short Term Goals: Week 1:  OT Short Term Goal 1 (Week 1): STG=LTG due to LOS  Skilled Therapeutic Interventions/Progress Updates:    1:1. Pt with no c/o pain. Pt completes ambualtion to/from ADL apartment with Supervision and no LOB with RW and VC for keeping feet inside the walker. Pt unable to walk and talk at same time, every time answering question stopping and responding to therapist despite encouragement to do both for dual task processing. Pt changes bed sheets with supervision-CGA with 1 LOB forward d/t tripping on sock. Pt requires min A to loft matress to tuck sheets around corners. Exited session with pt seated in recliner with family present to supervise and call light in reach. Pt verbalizes calling before getting up and family aware to refasten QRB prior to leaving.   Therapy Documentation Precautions:  Precautions Precautions: Fall Restrictions Weight Bearing Restrictions: No  See Function Navigator for Current Functional Status.   Therapy/Group: Individual Therapy  Tonny Branch 03/01/2018, 3:23 PM

## 2018-03-01 NOTE — Progress Notes (Signed)
+/-   sleep. PRN Norco given at 2236 for C/O HA. PVR's150 & 107. Declined scheduled colace, reports frequent stools. Refused SCD's. Called for assistance.Patrici Ranks A

## 2018-03-02 ENCOUNTER — Inpatient Hospital Stay (HOSPITAL_COMMUNITY): Payer: Medicare Other | Admitting: Occupational Therapy

## 2018-03-02 ENCOUNTER — Inpatient Hospital Stay (HOSPITAL_COMMUNITY): Payer: Medicare Other | Admitting: Physical Therapy

## 2018-03-02 ENCOUNTER — Inpatient Hospital Stay (HOSPITAL_COMMUNITY): Payer: Medicare Other | Admitting: Speech Pathology

## 2018-03-02 LAB — BASIC METABOLIC PANEL
Anion gap: 8 (ref 5–15)
BUN: 24 mg/dL — ABNORMAL HIGH (ref 6–20)
CO2: 28 mmol/L (ref 22–32)
Calcium: 8.3 mg/dL — ABNORMAL LOW (ref 8.9–10.3)
Chloride: 91 mmol/L — ABNORMAL LOW (ref 101–111)
Creatinine, Ser: 0.76 mg/dL (ref 0.61–1.24)
GFR calc Af Amer: >60 mL/min (ref >60)
GFR calc non Af Amer: >60 mL/min (ref >60)
Glucose, Bld: 117 mg/dL — ABNORMAL HIGH (ref 65–99)
Potassium: 4.5 mmol/L (ref 3.5–5.1)
Sodium: 127 mmol/L — ABNORMAL LOW (ref 135–145)

## 2018-03-02 MED ORDER — GABAPENTIN 300 MG PO CAPS
300.0000 mg | ORAL_CAPSULE | Freq: Every day | ORAL | Status: DC
  Administered 2018-03-02 – 2018-03-04 (×3): 300 mg via ORAL
  Filled 2018-03-02 (×3): qty 1

## 2018-03-02 NOTE — Progress Notes (Signed)
Physical Therapy Session Note  Patient Details  Name: Ryan Savage MRN: [id number] Date of Birth: 1929-02-26  Today's Date: 03/02/2018 PT Individual Time: 1349-1421 PT Individual Time Calculation (min): 32 min   Short Term Goals: Week 1:  PT Short Term Goal 1 (Week 1): Pt will negotiate 6 stairs w/ single rail and supervision PT Short Term Goal 2 (Week 1): Pt will ambulate >150 ft w/ supervision and LRAD PT Short Term Goal 3 (Week 1): Pt will perform bed mobility with supervision  Skilled Therapeutic Interventions/Progress Updates:  Pt received in room & agreeable to tx. Pt's wife present for session. Pt ambulates room<>gym with RW & supervision with cuing for heel strike and toe clearance with fair/good return demo. Pt negotiated 8 steps with B rails then 6 steps with R rail, standing laterally, with supervision. Pt's wife reports pt has had BLE neuropathy since initial stroke (not sure of exact date) & pt reports he has notified Dr. Letta Pate. Therapist educated pt & wife on current functional mobility at supervision goal level with RW. Educated them both on pt's need to use RW upon d/c, use side entrance to home where he has 3 steps + 3 steps with R rail to enter, avoid ambulating over ground of possible, and recommendations of f/u HHPT. Pt's wife voices concern of pt not ambulating long distances with therapist educating her on pt's ability to ambulate 150 ft with supervision; also discussed current supervision level of mobility. Therapist educated pt's wife on pt's impaired memory & recall and discussed need for caregiver training prior to d/c to ensure pt & wife safety with mobility & Ryan Savage (wife) is agreeable. At end of session pt left in bed with alarm set & wife present to supervise.   Therapy Documentation Precautions:  Precautions Precautions: Fall Restrictions Weight Bearing Restrictions: No   See Function Navigator for Current Functional Status.   Therapy/Group: Individual  Therapy  [name] 03/02/2018, 2:36 PM

## 2018-03-02 NOTE — Progress Notes (Signed)
Occupational Therapy Session Note  Patient Details  Name: Ryan Savage MRN: [id number] Date of Birth: [birth year]  Today's Date: 03/02/2018 OT Individual Time: 4259-5638 OT Individual Time Calculation (min): 60 min    Short Term Goals: Week 1:  OT Short Term Goal 1 (Week 1): STG=LTG due to LOS  Skilled Therapeutic Interventions/Progress Updates:    Pt seen for OT bathing/dressing session. Pt sitting up in recliner upon arrival, denying pain and agreeable to tx session. He ambulated throughout room with RW ad supervision to gather clothing items in prep for shower. He required cuing throughout session for RW management in functional context. He bathed seated on tub bench with supervision,standing with use of grab bar to complete pericare/buttock hygiene. He dressed seated in w/c, requiring cues to utilize RW during sit <> stand for LB clothing management. He completed grooming tasks standing at sink, leaning into sink for balance and energy conservation. One seated rset break required during ~8 minutes of standing tasks.  Pt returned to recliner at end of session, left seated with all needs in reach, QRB and chair alarm on with all needs in reach.  Discussed d/c planning with pt. Therapy team feels pt will be ready for d/c home at supervision level prior to 4/26 as planned. Pt in agreement, however, CSW reports pt's wife called and not feeling pt will be ready for d/c home on 4/26. Will cont to plan and work towards safe d/c home for all.   Therapy Documentation Precautions:  Precautions Precautions: Fall Restrictions Weight Bearing Restrictions: No Pain:   No/denies pain  See Function Navigator for Current Functional Status.   Therapy/Group: Individual Therapy  Sugar Vanzandt L 03/02/2018, 7:13 AM

## 2018-03-02 NOTE — Progress Notes (Signed)
Speech Language Pathology Daily Session Note  Patient Details  Name: Ryan Savage MRN: [id number] Date of Birth: 11/03/1929  Today's Date: 03/02/2018 SLP Individual Time: 1115-1200 SLP Individual Time Calculation (min): 45 min  Short Term Goals: Week 1: SLP Short Term Goal 1 (Week 1): STG=LTG due to ELOS  Skilled Therapeutic Interventions: Skilled treatment session focused on cognition goals. SLP facilitated session by providing supervision question cues to demonstrate anticipatory awareness by listing activities that would be safe to perform. Pt freely states that he shouldn't be driving. Pt with much improved awareness. Pt with great selective attention in a moderately distracting environment for ~ 30 minutes. Pt was left upright in recliner with all needs within reach. Pt excited that therapists are recommending early discharge d/t functional progress. ST supports this progress. Continue per current plan of care.      Function:  Eating Eating   Modified Consistency Diet: No Eating Assist Level: No help, No cues           Cognition Comprehension Comprehension assist level: Understands complex 90% of the time/cues 10% of the time  Expression   Expression assist level: Expresses complex 90% of the time/cues < 10% of the time  Social Interaction Social Interaction assist level: Interacts appropriately with others with medication or extra time (anti-anxiety, antidepressant).  Problem Solving Problem solving assist level: Solves basic 90% of the time/requires cueing < 10% of the time  Memory Memory assist level: Recognizes or recalls 75 - 89% of the time/requires cueing 10 - 24% of the time    Pain    Therapy/Group: Individual Therapy  Kanae Ignatowski 03/02/2018, 12:47 PM

## 2018-03-02 NOTE — Progress Notes (Signed)
Physical Therapy Session Note  Patient Details  Name: Ryan Savage MRN: [id number] Date of Birth: May 02, 1929  Today's Date: 03/02/2018 PT Individual Time: 0803-0901 PT Individual Time Calculation (min): 58 min   Short Term Goals: Week 1:  PT Short Term Goal 1 (Week 1): Pt will negotiate 6 stairs w/ single rail and supervision PT Short Term Goal 2 (Week 1): Pt will ambulate >150 ft w/ supervision and LRAD PT Short Term Goal 3 (Week 1): Pt will perform bed mobility with supervision  Skilled Therapeutic Interventions/Progress Updates:     Pt received in recliner, finishing breakfast, agreeable to therapy. Pt w/ no c/o pain but states he has not been sleeping well and complains of chronic insomnia. Pt states goals for therapy include regaining strength in BLE and resuming normal gait pattern. Pt educated on benefits of RW compared to standard walker and reason for RW recommendation upon discharge. Pt reports he has both devices at home and is agreeable to RW use. Pt educated on strategies to prevent falls at night, such as installing night lights and picking up clutter and rugs from floor. Pt ambulated w/ RW and supervision to gym, requiring verbal cuing for increased bilateral heel strike and decreased gait speed 2/2 safety precautions. Pt engaged in dynamic sitting balance task while seated on bench to facilitate anterior pelvic tilt and forward lean. Pt performed card matching game w/ RUE and required to pick cards up from ground before reaching to R then forward to match card. Pt demonstrated decreased recall of new information and confusion when matching cards, able to correct placement with increased verbal cues. Pt engaged in dynamic standing balance activity while standing on wedge for prolonged stretch of calf, HS and heel cord, while tossing horseshoes w/ RUE. Pt required steady assist for balance support. Pt performed sit<>stand w/o UE support, focusing on BLE strengthening and eccentric  control on stand>sit. Pt reports 8/10 difficulty with activity and requires extended time to complete 10 reps. As pt fatigued, required steady assist and tactile cues to push through BLE. Pt ambulated back to room in same manner, requiring verbal cuing to increase bilateral heel strike and maintain forward gaze. At end of session, pt left seated in recliner, quick release belt on, chair alarm on, and all needs within reach.  Therapy Documentation Precautions:  Precautions Precautions: Fall Restrictions Weight Bearing Restrictions: No  See Function Navigator for Current Functional Status.   Therapy/Group: Individual Therapy  Caffie Damme 03/02/2018, 9:16 AM

## 2018-03-02 NOTE — Progress Notes (Signed)
Subjective/Complaints: Poor sleep , nocturia without improvement despite higher dose oxybutnin Also c/o hx of restless legs and numbness in toes ROS denies HA visual change, CP, SOB, + anorexia  Objective: Vital Signs: Blood pressure 116/82, pulse 79, temperature 97.7 F (36.5 C), temperature source Oral, resp. rate 17, height 5\' 10"  (1.778 m), weight 74 kg (163 lb 3.2 oz), SpO2 97 %. No results found. No results found for this or any previous visit (from the past 72 hour(s)).   HEENT: normal Cardio: RRR and no murmur Resp: CTA B/L and unlabored GI: BS positive and NT, ND Extremity:  No Edema Skin:   Wound C/D/I Neuro: Abnormal Sensory pt states LE LT sensation is equal, Abnormal Motor 4- R delt Bi Tri Grip, 5/5 in LUE, 4/5 BLE HF, KE <ADF and Other orietned to person and place, date not day Musc/Skel:  Other no pain with UE or LE ROM Gen NAD   Assessment/Plan: 1. Functional deficits secondary to Left SDH which require 3+ hours per day of interdisciplinary therapy in a comprehensive inpatient rehab setting. Physiatrist is providing close team supervision and 24 hour management of active medical problems listed below. Physiatrist and rehab team continue to assess barriers to discharge/monitor patient progress toward functional and medical goals. FIM: Function - Bathing Position: Shower Body parts bathed by patient: Right arm, Right lower leg, Left arm, Left lower leg, Chest, Abdomen, Front perineal area, Buttocks, Right upper leg, Left upper leg Body parts bathed by helper: Back Assist Level: Touching or steadying assistance(Pt > 75%)  Function- Upper Body Dressing/Undressing What is the patient wearing?: Pull over shirt/dress Pull over shirt/dress - Perfomed by patient: Thread/unthread right sleeve, Thread/unthread left sleeve, Put head through opening, Pull shirt over trunk Button up shirt - Perfomed by patient: Thread/unthread right sleeve, Thread/unthread left sleeve, Pull  shirt around back, Button/unbutton shirt Assist Level: Supervision or verbal cues Function - Lower Body Dressing/Undressing What is the patient wearing?: Underwear, Socks, Shoes, Pants Position: Other (comment)(low sleeper couch in room) Underwear - Performed by patient: Thread/unthread right underwear leg, Thread/unthread left underwear leg, Pull underwear up/down Pants- Performed by patient: Thread/unthread right pants leg, Thread/unthread left pants leg, Pull pants up/down, Fasten/unfasten pants Socks - Performed by patient: Don/doff right sock, Don/doff left sock Shoes - Performed by patient: Don/doff right shoe, Don/doff left shoe, Fasten right, Fasten left Shoes - Performed by helper: Don/doff right shoe, Don/doff left shoe, Fasten right, Fasten left Assist for footwear: Supervision/touching assist Assist for lower body dressing: Touching or steadying assistance (Pt > 75%)  Function - Toileting Toileting steps completed by patient: Adjust clothing prior to toileting, Performs perineal hygiene, Adjust clothing after toileting Toileting steps completed by helper: Performs perineal hygiene, Adjust clothing after toileting Toileting Assistive Devices: Grab bar or rail Assist level: Touching or steadying assistance (Pt.75%)  Function - Air cabin crew transfer assistive device: Grab bar, Walker Assist level to toilet: Touching or steadying assistance (Pt > 75%) Assist level from toilet: Touching or steadying assistance (Pt > 75%)  Function - Chair/bed transfer Chair/bed transfer method: Ambulatory Chair/bed transfer assist level: Touching or steadying assistance (Pt > 75%) Chair/bed transfer assistive device: Armrests, Walker Chair/bed transfer details: Verbal cues for precautions/safety, Verbal cues for safe use of DME/AE, Verbal cues for technique  Function - Locomotion: Wheelchair Will patient use wheelchair at discharge?: No Wheelchair activity did not occur:  Safety/medical concerns Wheel 50 feet with 2 turns activity did not occur: Safety/medical concerns Wheel 150 feet activity did not occur:  Safety/medical concerns Function - Locomotion: Ambulation Assistive device: Walker-rolling Max distance: 150' Assist level: Supervision or verbal cues Assist level: Supervision or verbal cues Assist level: Supervision or verbal cues Walk 150 feet activity did not occur: Safety/medical concerns(fatigued) Assist level: Supervision or verbal cues Walk 10 feet on uneven surfaces activity did not occur: Safety/medical concerns  Function - Comprehension Comprehension: Auditory Comprehension assist level: Understands complex 90% of the time/cues 10% of the time  Function - Expression Expression: Verbal Expression assist level: Expresses complex 90% of the time/cues < 10% of the time  Function - Social Interaction Social Interaction assist level: Interacts appropriately with others with medication or extra time (anti-anxiety, antidepressant).  Function - Problem Solving Problem solving assist level: Solves basic 90% of the time/requires cueing < 10% of the time  Function - Memory Memory assist level: Recognizes or recalls 75 - 89% of the time/requires cueing 10 - 24% of the time Patient normally able to recall (first 3 days only): Location of own room, Staff names and faces, That he or she is in a hospital, Current season  Medical Problem List and Plan: 1.Decreased functional mobilitysecondary toenlarging left subdural hematoma. 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  -CIR PT, OT, SLP 2. DVT Prophylaxis/Anticoagulation: SCDs. Monitor for any signs of DVT 3. Pain Management:Hydrocodone as needed 4. Mood:Provide emotional support 5. Neuropsych: This patientiscapable of making decisions on hisown behalf. 6.  Skin/Wound Care:Routine skin checks 7. Fluids/Electrolytes/Nutrition:Routinein and outswith follow-up chemistries 8.Chronic systolic congestive heart failure. Monitor for any signs of fluid overload as Lasix is low dose but has neg fluid balance, will cont  10mg   9.Chronic atrial fibrillation. Not a candidate for anticoagulation due to SDH. Continue digoxin 0.125 mg daily as well as Toprol 25 mg daily 10.Seizure prophylaxis. Keppra 500 mg twice daily. 11.Hyponatremia. Fluid restriction. 1200, intake is ~1110ml- Follow-up chemistries  Na+ pnd 4/22   cont low dose Lasix  12.BPH. Pro-scar 5 mg daily. D/C  oxybutnin at noc given no improvement with nocturia 13.Hyperlipidemia. Lipitor 14.Constipation. Laxative assistance 15.GERD. Protonix 16.  Hypoalb start prostat 17 insomnia chronic but worse in hosp , restless leg and numbness     Trial gabapentin     18.  Cerebral edema prophyllaxis high dose decadron, per d/c summ medrol dose pack will taper to 2mg  Q12h, would cont x 2 d and reduce to 1mg  TID LOS (Days) 6 A FACE TO FACE EVALUATION WAS PERFORMED  Charlett Blake 03/02/2018, 7:49 AM

## 2018-03-02 NOTE — Progress Notes (Signed)
Restless all night, mainly nocturia. PVR=169.  "I haven't been able to sleep good at night, in years." At HS complained of numbness and tingling to toes on bilateral feet, not new per patient. 2-BM's Sunday, refused colace and requests med changed to PRN. Ryan Savage A

## 2018-03-03 ENCOUNTER — Ambulatory Visit: Payer: Medicare Other

## 2018-03-03 ENCOUNTER — Inpatient Hospital Stay (HOSPITAL_COMMUNITY): Payer: Medicare Other | Admitting: Physical Therapy

## 2018-03-03 ENCOUNTER — Inpatient Hospital Stay (HOSPITAL_COMMUNITY): Payer: Medicare Other

## 2018-03-03 ENCOUNTER — Other Ambulatory Visit (HOSPITAL_COMMUNITY): Payer: Medicare Other

## 2018-03-03 DIAGNOSIS — E871 Hypo-osmolality and hyponatremia: Secondary | ICD-10-CM

## 2018-03-03 DIAGNOSIS — S065X1S Traumatic subdural hemorrhage with loss of consciousness of 30 minutes or less, sequela: Secondary | ICD-10-CM

## 2018-03-03 MED ORDER — TRAZODONE HCL 50 MG PO TABS
25.0000 mg | ORAL_TABLET | Freq: Every day | ORAL | Status: DC
  Administered 2018-03-03 – 2018-03-04 (×2): 25 mg via ORAL
  Filled 2018-03-03 (×2): qty 1

## 2018-03-03 NOTE — Progress Notes (Signed)
Occupational Therapy Session Note  Patient Details  Name: Ryan Savage MRN: [id number] Date of Birth: 07/06/1929  Today's Date: 03/03/2018 OT Individual Time: 7505-1833 OT Individual Time Calculation (min): 60 min    Short Term Goals: Week 1:  OT Short Term Goal 1 (Week 1): STG=LTG due to LOS  Skilled Therapeutic Interventions/Progress Updates:    Pt received sitting up in recliner with family present and agreeable to therapy with no c/o pain. Pt provided with (S) and vc for RW management during functional mobility to toilet. Pt performed toilet transfer with (S), appropriately using grab bars and performing clothing management without cueing. Pt ambulated 160ft to tub room with family present and was provided demo and education re types/purpose/indications for various shower chairs/benches. Extensive discussion with pt and family present and multiple questions answered. Pt performed tub transfer with vc for use of grab bars and technique, however ultimately performing transfer at (S) overall. Pt then simulated walk in shower transfer with shower chair with (S) as well. Recommendations provided re home modifications to increase safety and I with ADL transfers. Extensive discussion and education with pt re urine management and safety awareness. Pt shown several options for disposable briefs at home for urine leakage online, and educated re indications/purpose. Pt left in recliner with QRB donned, chair alarm set, and family present.   Therapy Documentation Precautions:  Precautions Precautions: Fall Restrictions Weight Bearing Restrictions: No Pain: Pain Assessment Pain Scale: 0-10 Pain Score: 0-No pain  See Function Navigator for Current Functional Status.   Therapy/Group: Individual Therapy  Curtis Sites 03/03/2018, 12:23 PM

## 2018-03-03 NOTE — Progress Notes (Signed)
Physical Therapy Session Note  Patient Details  Name: Ryan Savage MRN: [id number] Date of Birth: 1929-03-02  Today's Date: 03/03/2018 PT Individual Time: 1100-1130 PT Individual Time Calculation (min): 30 min   Short Term Goals: Week 1:  PT Short Term Goal 1 (Week 1): Pt will negotiate 6 stairs w/ single rail and supervision PT Short Term Goal 2 (Week 1): Pt will ambulate >150 ft w/ supervision and LRAD PT Short Term Goal 3 (Week 1): Pt will perform bed mobility with supervision  Skilled Therapeutic Interventions/Progress Updates:    Session focused on functional gait on unit with RW for overall strengthening and NMR for dynamic balance re-training on Biodex for limits of stability on non compliant and compliant surface (level 9-10) with minimal UE support and on Kinetron in standing with mostly UE support and then without UE support (min assist for balance) x 5 reps. Gait on unit with supervision back to room and performed oral hygiene at sink prior to returning to recliner.   Therapy Documentation Precautions:  Precautions Precautions: Fall Restrictions Weight Bearing Restrictions: No Pain: Pain Assessment Pain Scale: 0-10 Pain Score: 0-No pain   See Function Navigator for Current Functional Status. Therapy/Group: Individual Therapy  Canary Brim Ivory Broad, PT, DPT  03/03/2018, 12:15 PM

## 2018-03-03 NOTE — Progress Notes (Signed)
Subjective/Complaints: Had some issues with sleep again last night.  Anxious to get started with therapies and asked when was is starting time today.  ROS: Patient denies fever, rash, sore throat, blurred vision, nausea, vomiting, diarrhea, cough, shortness of breath or chest pain, joint or back pain, headache, or mood change.    Objective: Vital Signs: Blood pressure 136/79, pulse 74, temperature (!) 97.5 F (36.4 C), temperature source Oral, resp. rate 18, height _0  (1.778 m), weight 74.3 kg (163 lb 12.8 oz), SpO2 98 %. No results found. Results for orders placed or performed during the hospital encounter of 02/24/18 (from the past 72 hour(s))  Basic metabolic panel     Status: Abnormal   Collection Time: 03/02/18  7:06 AM  Result Value Ref Range   Sodium 127 (L) 135 - 145 mmol/L   Potassium 4.5 3.5 - 5.1 mmol/L   Chloride 91 (L) 101 - 111 mmol/L   CO2 28 22 - 32 mmol/L   Glucose, Bld 117 (H) 65 - 99 mg/dL   BUN 24 (H) 6 - 20 mg/dL   Creatinine, Ser 0.76 0.61 - 1.24 mg/dL   Calcium 8.3 (L) 8.9 - 10.3 mg/dL   GFR calc non Af Amer >60 >60 mL/min   GFR calc Af Amer >60 >60 mL/min    Comment: (NOTE) The eGFR has been calculated using the CKD EPI equation. This calculation has not been validated in all clinical situations. eGFR's persistently <60 mL/min signify possible Chronic Kidney Disease.    Anion gap 8 5 - 15    Comment: Performed at [facility] [address], [place], [place] [address]     Constitutional: No distress . Vital signs reviewed. HEENT: EOMI, oral membranes moist Cardiovascular: RRR without murmur. No JVD    Respiratory: CTA Bilaterally without wheezes or rales. Normal effort    GI: BS +, non-tender, non-distended  Skin:   Wound C/D/I Neuro: Abnormal Sensory pt states LE LT sensation is equal, Abnormal Motor 4- R delt Bi Tri Grip, 5/5 in LUE, 4/5 BLE HF, KE <ADF.  Oriented to person and place.  Impulsive.  Decreased insight and awareness.   Musc/Skel:  Other no pain with UE or LE ROM Gen: Restless   Assessment/Plan: 1. Functional deficits secondary to Left SDH which require 3+ hours per day of interdisciplinary therapy in a comprehensive inpatient rehab setting. Physiatrist is providing close team supervision and 24 hour management of active medical problems listed below. Physiatrist and rehab team continue to assess barriers to discharge/monitor patient progress toward functional and medical goals. FIM: Function - Bathing Position: Shower Body parts bathed by patient: Right arm, Right lower leg, Left arm, Left lower leg, Chest, Abdomen, Front perineal area, Buttocks, Right upper leg, Left upper leg Body parts bathed by helper: Back Assist Level: Supervision or verbal cues  Function- Upper Body Dressing/Undressing What is the patient wearing?: Pull over shirt/dress Pull over shirt/dress - Perfomed by patient: Thread/unthread right sleeve, Thread/unthread left sleeve, Put head through opening, Pull shirt over trunk Button up shirt - Perfomed by patient: Thread/unthread right sleeve, Thread/unthread left sleeve, Pull shirt around back, Button/unbutton shirt Assist Level: Supervision or verbal cues Function - Lower Body Dressing/Undressing What is the patient wearing?: Underwear, Socks, Shoes, Pants Position: Wheelchair/chair at [employer] - Performed by patient: Thread/unthread right underwear leg, Thread/unthread left underwear leg, Pull underwear up/down Pants- Performed by patient: Thread/unthread right pants leg, Thread/unthread left pants leg, Pull pants up/down, Fasten/unfasten pants Socks - Performed  by patient: Don/doff right sock, Don/doff left sock Shoes - Performed by patient: Don/doff right shoe, Don/doff left shoe, Fasten right, Fasten left Shoes - Performed by helper: Don/doff right shoe, Don/doff left shoe, Fasten right, Fasten left Assist for footwear: Supervision/touching assist Assist for lower body  dressing: Supervision or verbal cues  Function - Toileting Toileting steps completed by patient: Adjust clothing prior to toileting, Performs perineal hygiene, Adjust clothing after toileting Toileting steps completed by helper: Performs perineal hygiene, Adjust clothing after toileting Toileting Assistive Devices: Grab bar or rail Assist level: Touching or steadying assistance (Pt.75%)  Function - Toilet Transfers Toilet transfer assistive device: Grab bar, Walker Assist level to toilet: Touching or steadying assistance (Pt > 75%) Assist level from toilet: Touching or steadying assistance (Pt > 75%)  Function - Chair/bed transfer Chair/bed transfer method: Ambulatory Chair/bed transfer assist level: Supervision or verbal cues Chair/bed transfer assistive device: Walker Chair/bed transfer details: Verbal cues for precautions/safety, Verbal cues for safe use of DME/AE, Verbal cues for technique  Function - Locomotion: Wheelchair Will patient use wheelchair at discharge?: No Wheelchair activity did not occur: Safety/medical concerns Wheel 50 feet with 2 turns activity did not occur: Safety/medical concerns Wheel 150 feet activity did not occur: Safety/medical concerns Function - Locomotion: Ambulation Assistive device: Walker-rolling Max distance: 15f Assist level: Supervision or verbal cues Assist level: Supervision or verbal cues Assist level: Supervision or verbal cues Walk 150 feet activity did not occur: Safety/medical concerns(fatigued) Assist level: Supervision or verbal cues Walk 10 feet on uneven surfaces activity did not occur: Safety/medical concerns  Function - Comprehension Comprehension: Auditory Comprehension assist level: Understands complex 90% of the time/cues 10% of the time  Function - Expression Expression: Verbal Expression assist level: Expresses complex 90% of the time/cues < 10% of the time  Function - Social Interaction Social Interaction assist  level: Interacts appropriately with others with medication or extra time (anti-anxiety, antidepressant).  Function - Problem Solving Problem solving assist level: Solves basic 90% of the time/requires cueing < 10% of the time  Function - Memory Memory assist level: Recognizes or recalls 75 - 89% of the time/requires cueing 10 - 24% of the time Patient normally able to recall (first 3 days only): Location of own room, Staff names and faces, That he or she is in a hospital, Current season  Medical Problem List and Plan: 1.Decreased functional mobilitysecondary toenlarging left subdural hematoma. 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  -CIR PT, OT, SLP---continue therapies 2. DVT Prophylaxis/Anticoagulation: SCDs.   3. Pain Management:Hydrocodone as needed 4. Mood:Provide emotional support 5. Neuropsych: This patientiscapable of making decisions on hisown behalf. 6. Skin/Wound Care:Routine skin checks 7. Fluids/Electrolytes/Nutrition:Routinein and outswith follow-up chemistries 8.Chronic systolic congestive heart failure.   Lasix is low dose, has neg fluid balance, will cont  167m  -weights stable Filed Weights   03/01/18 0500 03/02/18 0500 03/03/18 0022  Weight: 75.7 kg (166 lb 14.2 oz) 74 kg (163 lb 3.2 oz) 74.3 kg (163 lb 12.8 oz)      9.Chronic atrial fibrillation. Not a candidate for anticoagulation due to SDH. Continue digoxin 0.125 mg daily as well as Toprol 25 mg daily 10.Seizure prophylaxis. Keppra 500 mg twice daily. 11.Hyponatremia. Fluid restriction. 1200, intake is ~11803m  -sodium 127 on 4/22---continue low dose lasix 12.BPH. Pro-scar 5 mg daily. D/C  oxybutnin at noc given no improvement with nocturia 13.Hyperlipidemia. Lipitor 14.Constipation. Laxative  assistance 15.GERD. Protonix 16.  Hypoalb start  prostat 17 insomnia chronic but worse in hosp , restless leg and numbness     Trial gabapentin     18.  Cerebral edema prophyllaxis high dose decadron===  -tapering to off. Has been on 60m q12 since Sunday   LOS (Days) 7 A FACE TO FACE EVALUATION WAS PERFORMED  ZMeredith Staggers4/23/2019, 9:48 AM

## 2018-03-03 NOTE — Progress Notes (Signed)
Social Work Patient ID: Ryan Savage, male   DOB: 1929/09/05, 84 y.o.   MRN: [id number]  Met with pt and wife to discuss home health versus OP therapies, both want to have [facility] come back and work with him at home. Discussed will make referral and see if can have the same therapist he had previously. Has all DME. Very happy to be leaving Thursday.

## 2018-03-03 NOTE — Progress Notes (Signed)
Physical Therapy Session Note  Patient Details  Name: Ryan Savage MRN: [id number] Date of Birth: [birth year]  Today's Date: 03/03/2018 PT Individual Time: 0801-0902 PT Individual Time Calculation (min): 61 min   Short Term Goals: Week 1:  PT Short Term Goal 1 (Week 1): Pt will negotiate 6 stairs w/ single rail and supervision PT Short Term Goal 2 (Week 1): Pt will ambulate >150 ft w/ supervision and LRAD PT Short Term Goal 3 (Week 1): Pt will perform bed mobility with supervision  Skilled Therapeutic Interventions/Progress Updates:   Pt received in recliner, wife and son present and agreeable to therapy. Pt w/ no c/o pain. Session focused on family education on functional mobility and transfers in preparation for upcoming discharge. Pt ambulated w/ RW and supervision to gym, requiring verbal cuing to improve heel strike, maintain forward gaze, and maintain RW within BOS. Pt's family educated on technique and sequencing for the following: gait w/ RW, car transfer, 3 stairs w/ R and L railing, curb w/ RW, and sofa transfer. Pt's wife and son performed each task with patient at supervision level, demonstrating understanding. Pt performed Berg Balance Test and scored 53/66, indicating very high fall risk. Pt and family educated on meaning of score and discharge recommendations for RW use and supervision at all times. Pt demonstrated impaired memory throughout session and required reminders for therapist recommendation to use side door instead of front door due to lack of railings and need to ambulate through grass. Pt's family denies further questions or concerns at this time. At end of session, pt left seated in recliner, quick release belt on, chair alarm on, family present.     Therapy Documentation Precautions:  Precautions Precautions: Fall Restrictions Weight Bearing Restrictions: No    Balance: Balance Balance Assessed: Yes Standardized Balance Assessment Standardized Balance  Assessment: Berg Balance Test Berg Balance Test Sit to Stand: Able to stand  independently using hands Standing Unsupported: Able to stand 2 minutes with supervision Sitting with Back Unsupported but Feet Supported on Floor or Stool: Able to sit 2 minutes independently Stand to Sit: Sits safely with minimal use of hands Transfers: Able to transfer with verbal cueing and /or supervision Standing Unsupported with Eyes Closed: Able to stand 10 seconds with supervision Standing Ubsupported with Feet Together: Able to place feet together independently and stand for 1 minute with supervision From Standing, Reach Forward with Outstretched Arm: Reaches forward but needs supervision From Standing Position, Pick up Object from Floor: Able to pick up shoe, needs supervision From Standing Position, Turn to Look Behind Over each Shoulder: Needs supervision when turning Turn 360 Degrees: Needs close supervision or verbal cueing Standing Unsupported, Alternately Place Feet on Step/Stool: Able to complete 4 steps without aid or supervision Standing Unsupported, One Foot in Front: Able to take small step independently and hold 30 seconds Standing on One Leg: Tries to lift leg/unable to hold 3 seconds but remains standing independently Total Score: 33   Other Treatments:     See Function Navigator for Current Functional Status.   Therapy/Group: Individual Therapy  Caffie Damme 03/03/2018, 9:15 AM

## 2018-03-03 NOTE — Plan of Care (Signed)
  Problem: RH BOWEL ELIMINATION Goal: RH STG MANAGE BOWEL WITH ASSISTANCE Description STG Manage Bowel with MOD I Assistance.  Outcome: Progressing Goal: RH STG MANAGE BOWEL W/MEDICATION W/ASSISTANCE Description STG Manage Bowel with Medication with  Mod I Assistance.  Outcome: Progressing   Problem: RH BLADDER ELIMINATION Goal: RH STG MANAGE BLADDER WITH ASSISTANCE Description STG Manage Bladder With  Min Assistance  Outcome: Progressing Goal: RH STG MANAGE BLADDER WITH MEDICATION WITH ASSISTANCE Description STG Manage Bladder With Medication With Mod I Assistance.  Outcome: Progressing   Problem: RH SKIN INTEGRITY Goal: RH STG SKIN FREE OF INFECTION/BREAKDOWN Description Free from breakdown with mod assist  Outcome: Progressing Goal: RH STG MAINTAIN SKIN INTEGRITY WITH ASSISTANCE Description STG Maintain Skin Integrity With Assistance. Mod  Outcome: Progressing   Problem: RH SAFETY Goal: RH STG ADHERE TO SAFETY PRECAUTIONS W/ASSISTANCE/DEVICE Description STG Adhere to Safety Precautions With cues, reminders Assistance/Device.  Outcome: Progressing   Problem: RH KNOWLEDGE DEFICIT BRAIN INJURY Goal: RH STG INCREASE KNOWLEDGE OF SELF CARE AFTER BRAIN INJURY Description Pt and wife will be able to manage care after discharge using cues/reminders/handouts independently  Outcome: Progressing

## 2018-03-03 NOTE — Progress Notes (Signed)
Speech Language Pathology Daily Session Note  Patient Details  Name: Ryan Savage MRN: [id number] Date of Birth: 1929/10/03  Today's Date: 03/03/2018 SLP Individual Time: 1400-1500 SLP Individual Time Calculation (min): 60 min  Short Term Goals: Week 1: SLP Short Term Goal 1 (Week 1): STG=LTG due to ELOS  Skilled Therapeutic Interventions:Skilled ST services focused on cognitive skills and education. SLP facilitated anticipatory awareness skills given wh Q scenarios pt required supervision A verbal cues. SLP facilitated problem solving with novel card game, demonstrating Mod I at basic level and played in more complex form min A verbal cues with min-supervision A for recall of rules. SLP provided education for safety awareness at home and pt demonstrated recall of safety protocol for standing and transfer with supervision A verbal cues. Pt was left in room with call bell within reach. Reccomend to continue skilled ST services.       Function:  Eating Eating Eating activity did not occur: N/A Modified Consistency Diet: No Eating Assist Level: No help, No cues           Cognition Comprehension Comprehension assist level: Understands complex 90% of the time/cues 10% of the time  Expression   Expression assist level: Expresses complex 90% of the time/cues < 10% of the time  Social Interaction Social Interaction assist level: Interacts appropriately with others with medication or extra time (anti-anxiety, antidepressant).  Problem Solving Problem solving assist level: Solves basic problems with no assist  Memory Memory assist level: Recognizes or recalls 75 - 89% of the time/requires cueing 10 - 24% of the time;Recognizes or recalls 90% of the time/requires cueing < 10% of the time    Pain Pain Assessment Pain Scale: 0-10 Pain Score: 0-No pain  Therapy/Group: Individual Therapy  Marnette Perkins  [facility] 03/03/2018, 3:04 PM

## 2018-03-04 ENCOUNTER — Inpatient Hospital Stay (HOSPITAL_COMMUNITY): Payer: Medicare Other | Admitting: Occupational Therapy

## 2018-03-04 ENCOUNTER — Inpatient Hospital Stay (HOSPITAL_COMMUNITY): Payer: Medicare Other | Admitting: Physical Therapy

## 2018-03-04 ENCOUNTER — Inpatient Hospital Stay (HOSPITAL_COMMUNITY): Payer: Medicare Other

## 2018-03-04 MED ORDER — DEXAMETHASONE 2 MG PO TABS
1.0000 mg | ORAL_TABLET | Freq: Two times a day (BID) | ORAL | Status: DC
  Administered 2018-03-04 – 2018-03-05 (×2): 1 mg via ORAL
  Filled 2018-03-04 (×2): qty 1

## 2018-03-04 NOTE — Progress Notes (Signed)
Physical Therapy Discharge Summary  Patient Details  Name: Ryan Savage MRN: [id number] Date of Birth: Dec 10, 1928  Today's Date: 03/04/2018  PT Individual Time: 1103-1200 and 8786-7672 PT Individual Time Calculation (min): 57 min and 28 min  Patient has met 9 of 9 long term goals due to improved activity tolerance, improved balance, improved postural control and improved coordination.  Patient to discharge at an ambulatory level Supervision with rolling walker.   Patient's care partner is independent to provide the necessary cognitive assistance at discharge.  Recommendation:  Patient will benefit from ongoing skilled PT services in home health setting to continue to advance safe functional mobility, address ongoing impairments in balance, global weakness, safety awareness, memory, gait pattern, endurance, and minimize fall risk.  Equipment: No equipment provided. Pt already has RW.   Reasons for discharge: treatment goals met and discharge from hospital  Patient/family agrees with progress made and goals achieved: Yes   Skilled PT Treatment Session 1: Pt received in recliner, wife present, agreeable to therapy. Pt w/ no c/o pain. Pt eager for therapy and requests to practice walking before discharge home tomorrow. Pt ambulated w/ RW and supervision to gym, requiring verbal cuing for increased bilateral heel strike and to maintain forward gaze. Pt performed car transfer w/ supervision and verbal cuing for technique and sequencing. Pt ambulated up/down ramp and through compliant surface with supervision, increased verbal cuing for RW management in compliant surface. Pt states he is eager to go home and be able to walk through grass in yard, pt educated on discharge recommendations to always have someone with him and to use RW at all times. Pt performed bed mobility mod I and sofa and recliner transfer with supervision and verbal cuing to increase anterior weight shift from low surface. Pt  reported needing to have BM. Pt used bathroom with supervision and use of grab rails for standing balance. Pt unable to have BM, states it was a "false alarm."  Pt performed 12 stairs w/ close supervision and alternating use of R rail then L rail, simulating home environment. Pt educated regarding stair negotiation sequencing and technique with single railing and bilateral railings. Pt demonstrated poor recall of new information, as we have discussed therapist recommendations to enter house from the side door but pt still mentioning entering front door w/ no rails. Pt educated on therapist discharge recommendations for stair negotiation at home through side door with R railing. Pt performed curb w/ RW and supervision, verbal cuing for technique and sequencing. Pt demonstrated poor self awareness and memory regarding deficits 2/2 SDH. Pt engaged in standing dynamic balance activity of tossing horseshoes while standing on wedge to facilitate prolonged bilateral heel cord and HS stretch. Pt ambulated back to room with RW and supervision. At end of session, pt left seated in w/c, quick release belt on, chair alarm on and family present.    Session 2: Pt received in recliner, agreeable to therapy. Pt ambulated w/ RW and supervision to bathroom for continent void w/ use of grab rails for balance support. Pt educated on RW management, including slow and controlled movements and keeping RW within BOS, especially during turns. Pt ambulated w/ RW and supervision to gym, requiring verbal cuing for increased bilateral heel strike, upward gaze, and decreased gait speed to increase safety. Pt educated on floor transfer and safety precautions should a fall occur upon discharge. Pt performed floor transfer w/ supervision. Pt ambulated w/ RW and supervision to apartment kitchen for practice manipulating RW and objects  in kitchen, requiring verbal cuing for technique. Pt and wife deny further questions or concerns regarding  discharge home at this point. Pt educated on recommendations to have someone with him at all times, use RW and follow wife's instructions. At end of session, pt left seated in recliner, quick release belt on, chair alarm on, wife present.   PT Discharge Precautions/Restrictions Precautions Precautions: Fall Restrictions Weight Bearing Restrictions: No Pain Pain Assessment Pain Scale: 0-10 Pain Score: 0-No pain Vision/Perception   Pt wears glasses at all times No Change from baseline  Cognition Overall Cognitive Status: Impaired/Different from baseline Arousal/Alertness: Awake/alert Orientation Level: Oriented X4 Attention: Selective Selective Attention: Impaired Selective Attention Impairment: Functional complex;Verbal complex Memory: Impaired Memory Impairment: Decreased recall of new information;Decreased short term memory Decreased Short Term Memory: Verbal basic;Functional basic Awareness: Impaired Awareness Impairment: Emergent impairment Safety/Judgment: Impaired Sensation  BLE sensation grossly intact to light touch  Motor  Motor Motor: Hemiplegia Motor - Skilled Clinical Observations: R mild hemi, prior R SDH affecting L side, global weakness  Mobility Bed Mobility Bed Mobility: Rolling Right;Rolling Left;Sit to Supine;Supine to Sit Rolling Right: 6: Modified independent (Device/Increase time) Rolling Left: 6: Modified independent (Device/Increase time) Supine to Sit: 6: Modified independent (Device/Increase time) Sit to Supine: 6: Modified independent (Device/Increase time) Transfers Transfers: Yes Sit to Stand: 5: Supervision Sit to Stand Details: Verbal cues for sequencing;Verbal cues for technique;Verbal cues for safe use of DME/AE Locomotion  Ambulation Ambulation: Yes Ambulation/Gait Assistance: 5: Supervision Ambulation Distance (Feet): 150 Feet Assistive device: Rolling walker Ambulation/Gait Assistance Details: Verbal cues for technique;Verbal cues  for sequencing;Verbal cues for safe use of DME/AE;Verbal cues for gait pattern Gait Gait: Yes Gait Pattern: Impaired Gait Pattern: Decreased dorsiflexion - right;Decreased dorsiflexion - left;Decreased step length - right;Decreased step length - left;Step-through pattern(decreased bilateral heel strike) Stairs / Additional Locomotion Stairs: Yes Stairs Assistance: 5: Supervision Stairs Assistance Details: Verbal cues for technique;Verbal cues for sequencing;Verbal cues for precautions/safety Stair Management Technique: One rail Right;One rail Left Number of Stairs: 12 Height of Stairs: 6 Ramp: 5: Supervision Curb: 5: Supervision Wheelchair Mobility Wheelchair Mobility: No  Trunk/Postural Assessment  Forward head and shoulders   Increased Thoracic Kyphosis  Balance   Berg Balance Scale: 33/56 on 03/03/18  Extremity Assessment   RLE strength: Knee flexion 3+/5 Knee extension 3+/5 Dorsiflexion 3+/5 Plantarflexion 3+/5 RUE strength: Elbow flexion 4/5 Elbow extension 4/5 Grip strength 4/5 LLE strength: Knee flexion 4-/5 Knee extension 4-/5 Dorsiflexion 4-/5 Plantarflexion 4-/5  Elbow flexion 4+/5 Elbow extension 4+/5 Grip strength 4+/5       See Function Navigator for Current Functional Status.  Caffie Damme 03/04/2018, 12:11 PM

## 2018-03-04 NOTE — Discharge Summary (Signed)
Discharge summary job [id number]

## 2018-03-04 NOTE — Progress Notes (Signed)
Occupational Therapy Discharge Summary  Patient Details  Name: Ryan Savage MRN: [id number] Date of Birth: 1929-03-10   Patient has met 10 of 10 long term goals due to improved activity tolerance, improved balance, postural control, functional use of  LEFT upper extremity, improved awareness and improved coordination.  Patient to discharge at overall Supervision level.  Patient's care partner is independent to provide the necessary physical and cognitive assistance at discharge.  Family education completed with pt's wife regarding shower stall transfer, pt's cognitive deficits and is aware of pt's need for supervision at d/c. She voices willing and ableness to provide needed assist at d/c.   Recommendation:  Patient will benefit from ongoing skilled OT services in home health setting to continue to advance functional skills in the area of BADL, iADL and Reduce care partner burden.  Equipment: No equipment provided; pt has shower chair at home- recommend use in shower stall at d/c.   Reasons for discharge: treatment goals met and discharge from hospital  Patient/family agrees with progress made and goals achieved: Yes  OT Discharge Precautions/Restrictions  Precautions Precautions: Fall Restrictions Weight Bearing Restrictions: No Vision Baseline Vision/History: Wears glasses Wears Glasses: At all times Patient Visual Report: No change from baseline Vision Assessment?: No apparent visual deficits Perception  Perception: Within Functional Limits Praxis Praxis: Intact Cognition Overall Cognitive Status: Impaired/Different from baseline Arousal/Alertness: Awake/alert Orientation Level: Oriented X4 Attention: Selective Selective Attention: Impaired Selective Attention Impairment: Functional complex;Verbal complex Memory: Impaired Memory Impairment: Decreased recall of new information;Decreased short term memory Decreased Short Term Memory: Verbal basic;Functional  basic Awareness: Impaired Awareness Impairment: Emergent impairment Problem Solving: Impaired Problem Solving Impairment: Functional complex;Verbal complex Safety/Judgment: Appears intact Sensation Sensation Light Touch: Appears Intact Proprioception: Appears Intact Coordination Gross Motor Movements are Fluid and Coordinated: Yes Fine Motor Movements are Fluid and Coordinated: Yes Motor  Motor Motor: Hemiplegia Motor - Skilled Clinical Observations: R mild hemi, prior R SDH affecting L side, global weakness Motor - Discharge Observations: R mild hemi, prior R SDH affecting L side, global weakness Mobility  Bed Mobility Bed Mobility: Rolling Right;Rolling Left;Sit to Supine;Supine to Sit Rolling Right: 6: Modified independent (Device/Increase time) Rolling Left: 6: Modified independent (Device/Increase time) Supine to Sit: 6: Modified independent (Device/Increase time) Sit to Supine: 6: Modified independent (Device/Increase time) Transfers Sit to Stand: 5: Supervision Sit to Stand Details: Verbal cues for sequencing;Verbal cues for technique;Verbal cues for safe use of DME/AE  Trunk/Postural Assessment  Cervical Assessment Cervical Assessment: Exceptions to WFL(Forward head) Thoracic Assessment Thoracic Assessment: Exceptions to WFL(Kyphotic) Lumbar Assessment Lumbar Assessment: Exceptions to WFL(Posterior pelvic tilt) Postural Control Postural Control: Within Functional Limits  Balance Balance Balance Assessed: Yes Static Sitting Balance Static Sitting - Balance Support: Feet supported;No upper extremity supported Static Sitting - Level of Assistance: 6: Modified independent (Device/Increase time) Dynamic Sitting Balance Dynamic Sitting - Balance Support: During functional activity;Feet supported Dynamic Sitting - Level of Assistance: 5: Stand by assistance Sitting balance - Comments: Sitting during bathing/dressing tasks Static Standing Balance Static Standing -  Balance Support: During functional activity;Right upper extremity supported;Left upper extremity supported Static Standing - Level of Assistance: 5: Stand by assistance Dynamic Standing Balance Dynamic Standing - Balance Support: During functional activity;Right upper extremity supported;Left upper extremity supported Dynamic Standing - Level of Assistance: 5: Stand by assistance Dynamic Standing - Comments: Standing during bathing/dressing Extremity/Trunk Assessment RUE Assessment RUE Assessment: Exceptions to [facility] RUE AROM (degrees) Overall AROM Right Upper Extremity: Within functional limits for tasks performed RUE Strength RUE Overall  Strength: (3+/5 shoulder flexion; 4/5 remainder of muscle groups) LUE Assessment LUE Assessment: Within Functional Limits   See Function Navigator for Current Functional Status.  Ling Flesch L 03/04/2018, 12:50 PM

## 2018-03-04 NOTE — Progress Notes (Signed)
Physical Therapy Session Note  Patient Details  Name: Ryan Savage MRN: [id number] Date of Birth: [birth year]  Today's Date: 03/04/2018 PT Individual Time: 1705-1730 PT Individual Time Calculation (min): 25 min   Short Term Goals: Week 1:  PT Short Term Goal 1 (Week 1): Pt will negotiate 6 stairs w/ single rail and supervision PT Short Term Goal 2 (Week 1): Pt will ambulate >150 ft w/ supervision and LRAD PT Short Term Goal 3 (Week 1): Pt will perform bed mobility with supervision  Skilled Therapeutic Interventions/Progress Updates:     Pt received sitting in WC and agreeable to PT  Ambulatory transfer to BR with RW and supervision assist for urination. Hand hygiene following toileting without assist from PT  Gait training with RW to and from room x 170f with distant supervision assist from PT. Pt able to self adjust gait pattern to improve heel contact and proper AD management without increased cues from PT as needed.   Dynamic gait training with RW to step over 3 obstacles in floor(1") x 4 bouts. With supervision assist from PT for safety with min cues for Ad management and technique.    [facility] to [facility] and all needs met.     Therapy Documentation Precautions:  Precautions Precautions: Fall Restrictions Weight Bearing Restrictions: No Vital Signs: Therapy Vitals Temp: 97.6 F (36.4 C) Temp Source: Oral Pulse Rate: 78 Resp: 18 BP: 125/84 Patient Position (if appropriate): Sitting Oxygen Therapy SpO2: 100 % O2 Device: Room Air Pain: Pain Assessment Pain Score: 0-No pain   See Function Navigator for Current Functional Status.   Therapy/Group: Individual Therapy  ALorie Phenix4/24/2019, 5:34 PM

## 2018-03-04 NOTE — Progress Notes (Signed)
Occupational Therapy Session Note  Patient Details  Name: Ryan Savage MRN: [id number] Date of Birth: 1929-09-28  Today's Date: 03/04/2018 OT Individual Time: 1884-1660 OT Individual Time Calculation (min): 60 min    Short Term Goals: Week 1:  OT Short Term Goal 1 (Week 1): STG=LTG due to LOS  Skilled Therapeutic Interventions/Progress Updates:    Pt seen for OT ADL bathing/dressing session. Pt in supine upon arrival, awake and agreeable to tx session. He reports he slept better last night and is excited for planned d/c home tomorrow. Pt desiring to shower this morning. He ambulated throughout room with RW, required min-mod cuing for RW management in functional context. He gathered clothing items from closet and then transitioned to sink to complete grooming tasks in standing. Pt demonstrated improved functional activity tolerance, tolerating ~5 minutes of dynamic standing to complete functional tasks without need for rest break.  He bathed seated on tub bench, standing with use of grab bar to complete pericare/buttock hygiene. He returned to chair to dress, cuing for safety awareness to sit during UB dressing in order to reduce fall risk.  He ambulated to therapy gym, unable to recall directions to therapy gym, pt required max questioning cues for pt to problem solve ways to find directions to therapy gym.  In gym, completed standing peg board activity while standing on foam wedge for heel cord stretch. Able to replicate moderatly complex pattern with min questioning cues. Pt returned to room at end of session, left seated in recliner with chair alarm on, declining use of QRB. All needs in reach.  Throughout session, pt required VCs for hand placement during sit <> stands.   Therapy Documentation Precautions:  Precautions Precautions: Fall Restrictions Weight Bearing Restrictions: No Pain:   No/denies pain  See Function Navigator for Current Functional Status.   Therapy/Group:  Individual Therapy  Yumna Ebers L 03/04/2018, 7:08 AM

## 2018-03-04 NOTE — Progress Notes (Signed)
Subjective/Complaints: In good spirits last night. Slept 4-5 hours which is an improvement. Ready for therapies today  ROS: Patient denies fever, rash, sore throat, blurred vision, nausea, vomiting, diarrhea, cough, shortness of breath or chest pain, joint or back pain, headache, or mood change.   Objective: Vital Signs: Blood pressure 110/70, pulse 84, temperature 98.4 F (36.9 C), temperature source Oral, resp. rate 18, height _0  (1.778 m), weight 74.4 kg (164 lb 0.4 oz), SpO2 96 %. No results found. Results for orders placed or performed during the hospital encounter of 02/24/18 (from the past 72 hour(s))  Basic metabolic panel     Status: Abnormal   Collection Time: 03/02/18  7:06 AM  Result Value Ref Range   Sodium 127 (L) 135 - 145 mmol/L   Potassium 4.5 3.5 - 5.1 mmol/L   Chloride 91 (L) 101 - 111 mmol/L   CO2 28 22 - 32 mmol/L   Glucose, Bld 117 (H) 65 - 99 mg/dL   BUN 24 (H) 6 - 20 mg/dL   Creatinine, Ser 0.76 0.61 - 1.24 mg/dL   Calcium 8.3 (L) 8.9 - 10.3 mg/dL   GFR calc non Af Amer >60 >60 mL/min   GFR calc Af Amer >60 >60 mL/min    Comment: (NOTE) The eGFR has been calculated using the CKD EPI equation. This calculation has not been validated in all clinical situations. eGFR's persistently <60 mL/min signify possible Chronic Kidney Disease.    Anion gap 8 5 - 15    Comment: Performed at [facility] [address], [place], [place] [address]     Constitutional: No distress . Vital signs reviewed. HEENT: EOMI, oral membranes moist Cardiovascular: RRR without murmur. No JVD    Respiratory: CTA Bilaterally without wheezes or rales. Normal effort    GI: BS +, non-tender, non-distended  Neuro: Abnormal Sensory pt states LE LT sensation is equal, Abnormal Motor 4- R delt Bi Tri Grip, 5/5 in LUE, 4/5 BLE HF, KE <ADF.  Oriented to person and place.  Impulsive.  Decreased insight and awareness.  Musc/Skel:  Other no pain with UE or LE ROM Gen:  Restless   Assessment/Plan: 1. Functional deficits secondary to Left SDH which require 3+ hours per day of interdisciplinary therapy in a comprehensive inpatient rehab setting. Physiatrist is providing close team supervision and 24 hour management of active medical problems listed below. Physiatrist and rehab team continue to assess barriers to discharge/monitor patient progress toward functional and medical goals.  FIM: Function - Bathing Position: Shower Body parts bathed by patient: Right arm, Right lower leg, Left arm, Left lower leg, Chest, Abdomen, Front perineal area, Buttocks, Right upper leg, Left upper leg, Back Body parts bathed by helper: Back Assist Level: Supervision or verbal cues  Function- Upper Body Dressing/Undressing What is the patient wearing?: Pull over shirt/dress Pull over shirt/dress - Perfomed by patient: Thread/unthread right sleeve, Thread/unthread left sleeve, Put head through opening, Pull shirt over trunk Button up shirt - Perfomed by patient: Thread/unthread right sleeve, Thread/unthread left sleeve, Pull shirt around back, Button/unbutton shirt Assist Level: Supervision or verbal cues Function - Lower Body Dressing/Undressing What is the patient wearing?: Underwear, Socks, Shoes, Pants Position: Wheelchair/chair at [employer] - Performed by patient: Thread/unthread right underwear leg, Thread/unthread left underwear leg, Pull underwear up/down Pants- Performed by patient: Thread/unthread right pants leg, Thread/unthread left pants leg, Pull pants up/down, Fasten/unfasten pants Socks - Performed by patient: Don/doff right sock, Don/doff left sock Shoes - Performed by  patient: Don/doff right shoe, Don/doff left shoe, Fasten right, Fasten left Shoes - Performed by helper: Don/doff right shoe, Don/doff left shoe, Fasten right, Fasten left Assist for footwear: Independent Assist for lower body dressing: Supervision or verbal cues  Function -  Toileting Toileting steps completed by patient: Adjust clothing prior to toileting, Performs perineal hygiene, Adjust clothing after toileting Toileting steps completed by helper: Performs perineal hygiene, Adjust clothing after toileting Toileting Assistive Devices: Grab bar or rail Assist level: Supervision or verbal cues  Function - Air cabin crew transfer assistive device: Grab bar, Walker Assist level to toilet: Supervision or verbal cues Assist level from toilet: Supervision or verbal cues  Function - Chair/bed transfer Chair/bed transfer method: Ambulatory Chair/bed transfer assist level: Supervision or verbal cues Chair/bed transfer assistive device: Walker Chair/bed transfer details: Verbal cues for precautions/safety, Verbal cues for safe use of DME/AE, Verbal cues for technique  Function - Locomotion: Wheelchair Will patient use wheelchair at discharge?: No Wheelchair activity did not occur: Safety/medical concerns Wheel 50 feet with 2 turns activity did not occur: Safety/medical concerns Wheel 150 feet activity did not occur: Safety/medical concerns Function - Locomotion: Ambulation Assistive device: Walker-rolling Max distance: 150' Assist level: Supervision or verbal cues Assist level: Supervision or verbal cues Assist level: Supervision or verbal cues Walk 150 feet activity did not occur: Safety/medical concerns(fatigued) Assist level: Supervision or verbal cues Walk 10 feet on uneven surfaces activity did not occur: Safety/medical concerns  Function - Comprehension Comprehension: Auditory Comprehension assist level: Understands complex 90% of the time/cues 10% of the time  Function - Expression Expression: Verbal Expression assist level: Expresses complex 90% of the time/cues < 10% of the time  Function - Social Interaction Social Interaction assist level: Interacts appropriately with others with medication or extra time (anti-anxiety,  antidepressant).  Function - Problem Solving Problem solving assist level: Solves basic problems with no assist  Function - Memory Memory assist level: Recognizes or recalls 90% of the time/requires cueing < 10% of the time Patient normally able to recall (first 3 days only): Location of own room, Staff names and faces, That he or she is in a hospital, Current season  Medical Problem List and Plan: 1.Decreased functional mobilitysecondary toenlarging left subdural hematoma. 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  -CIR PT, OT, SLP---continue therapies. ELOS 4/25 2. DVT Prophylaxis/Anticoagulation: SCDs.   3. Pain Management:Hydrocodone as needed 4. Mood:Provide emotional support 5. Neuropsych: This patientiscapable of making decisions on hisown behalf. 6. Skin/Wound Care:Routine skin checks 7. Fluids/Electrolytes/Nutrition:Routinein and outswith follow-up chemistries 8.Chronic systolic congestive heart failure.   Lasix is low dose, has neg fluid balance, will cont  34m   -[facility] stable [facility] Center[facility]   03/02/18 0500 03/03/18 0022 03/04/18 0300  Weight: 74 kg (163 lb 3.2 oz) 74.3 kg (163 lb 12.8 oz) 74.4 kg (164 lb 0.4 oz)      9.Chronic atrial fibrillation. Not a candidate for anticoagulation due to SDH. Continue digoxin 0.125 mg daily as well as Toprol 25 mg daily 10.Seizure prophylaxis. Keppra 500 mg twice daily. 11.Hyponatremia. Fluid restriction. 1200, intake is ~11857m   -sodium 127 on 4/22---continue low dose lasix 12.BPH. Pro-scar 5 mg daily. D/C  oxybutnin at noc given no improvement with nocturia 13.Hyperlipidemia. Lipitor 14.Constipation. Laxative assistance 15.GERD. Protonix 16.  Hypoalb start prostat 17 insomnia chronic but worse in hosp , improved with trazodone 2542m  18.  Cerebral edema  prophyllaxis high dose decadron===  -tapering to off. Has been on 22m q12 since Sunday--reduce to 17mq12   LOS (Days) 8 A FACE TO FACE EVALUATION WAS PERFORMED  ZaMeredith Staggers/24/2019, 10:12 AM

## 2018-03-04 NOTE — Discharge Summary (Signed)
NAME:  Ryan Savage, Ryan Savage NO.:  MEDICAL RECORD NO.:  [id number]  LOCATION:                                 FACILITY:  PHYSICIAN:  Charlett Blake, M.D.DATE OF BIRTH:  1929-05-08  DATE OF ADMISSION:  02/24/2018 DATE OF DISCHARGE:                              DISCHARGE SUMMARY   DISCHARGE DIAGNOSES: 1. Enlarging left subdural hematoma, status post left burr hole for     evacuation of subdural hematoma, __________, with recent burr hole     craniectomy for left side; chronic subdural hematoma, January 25, 2018. 2. Sequential compression devices for deep venous thrombosis     prophylaxis. 3. Pain management. 4. Chronic systolic congestive heart failure. 5. Chronic atrial fibrillation. 6. Seizure prophylaxis. 7. Hyponatremia. 8. Benign prostatic hyperplasia. 9. Hyperlipidemia. 10.Constipation. 11.Gastroesophageal reflux disease. 12.Insomnia.  HISTORY OF PRESENT ILLNESS:  This is an 84 year old right-handed male with history of atrial fibrillation; [name] with chronic systolic congestive heart failure; recent burr hole craniectomy, left side; chronic subdural hematoma, January 25, 2018; later discharged home, doing well.  Lives with spouse, ambulating without assistive device. Presented on February 19, 2018, with recent falls, progressive neurological decline, noted elevated blood pressure per Home Health aid, hyponatremia at 123.  Followup CT reviewed, showed recurrent and enlarging left-sided subdural hematoma.  Underwent left burr hole for evacuation of hematoma on February 20, 2018, per Dr. Arnoldo Morale.  Decadron protocol is indicated. Maintained on fluid restriction for hyponatremia.  No current plan for Declomycin at this time.  Monitoring for any signs of fluid overload. In regard to his atrial fibrillation, not a candidate for anticoagulation due to subdural hematoma.  Remained on Keppra for seizure prophylaxis.  His Lasix was held for a short time due  to hyponatremia and resumed at a lower dose.  Physical and occupational therapy ongoing.  The patient was admitted for a comprehensive rehab program.  PAST MEDICAL HISTORY:  See discharge diagnoses.  SOCIAL HISTORY:  Lives with spouse, was ambulating without assistive device.  Needing some assistance for ADLs prior to admission.  FUNCTIONAL STATUS UPON ADMISSION TO REHAB SERVICES:  Minimal assist; 115 feet, rolling walker; minimal assist, sit to stand; min-to-mod assist, activities of daily living.  PHYSICAL EXAMINATION:  VITAL SIGNS:  Blood pressure 121/85, pulse 49, temperature 97, and respirations 16. GENERAL:  Alert male.  Normal language.  Reasonable insight and awareness. HEENT:  Craniectomy site clean and dry.  EOMs intact. NECK:  Supple.  Nontender.  No JVD. CARDIAC:  Rate irregularly irregular. ABDOMEN:  Soft, nontender.  Good bowel sounds. LUNGS:  Clear to auscultation without wheeze. NEUROLOGIC:  Motor strength; 4/5 upper extremities, 3+ to 4/5  bilateral lower extremities, proximal to distal.  REHABILITATION HOSPITAL COURSE:  The patient was admitted to Inpatient Rehab Services.  Therapies initiated on a 3-hour daily basis, consisting of physical therapy, occupational therapy, speech therapy, and rehabilitation nursing.  Following issues were addressed during the patient's rehabilitation stay.  Pertaining to Mr. Baller's subdural hematoma, he had undergone redo left burr hole craniectomy evacuation of subdural hematoma on February 20, 2018, after initial left side chronic subdural hematoma evacuation on January 25, 2018.  Surgical site is healing nicely.  He would follow up with Neurosurgery, Dr. Arnoldo Morale. SCDs for DVT prophylaxis.  No signs of DVT.  Pain management, use of hydrocodone on a limited basis.  He exhibited no signs of fluid overload.  Low-dose Lasix was resumed at 10 mg.  negative fluid balance. Monitored closely for hyponatremia with fluid restriction;  latest sodium of 127.  The patient noted history of chronic atrial fibrillation, not a candidate for anticoagulation due to subdural hematoma.  He remained on digoxin as well as Toprol.  Noted history of BPH.  Proscar as advised. No dysuria or hematuria.  Bouts of insomnia.  Maintained on low-dose trazodone.  The patient was slowly tapered from his Decadron protocol after subdural as per Neurosurgery.  The patient received weekly collaborative interdisciplinary team conferences to discuss estimated length of stay, family teaching, or any barriers to his discharge. Sessions focused on functional ambulation on the unit with a rolling walker and energy conservation.  He required supervision for his ambulation.  Berg testing scoring 33/56, indicating fall risk which was discussed with the family.  He could ambulate to the rehab gym with close supervision.  The patient's family was educated on technique and sequencing for following with rolling walker, navigating stairs, using hand rail.  Wife and son performed each task with the patient at a supervision level.  He could gather his belongings for activities of daily living and homemaking.  He was able to communicate his needs, problem solving at a modified independent level, was advised no driving. Family teaching completed and planned discharge to home.  DISCHARGE MEDICATIONS:  Included: 1. Lipitor 20 mg p.o. daily. 2. Decadron protocol as directed. 3. Lanoxin 0.125 mg p.o. daily. 4. Colace 100 mg p.o. b.i.d. 5. Proscar 5 mg p.o. daily. 6. Flonase 1 spray each nostril daily. 7. Lasix 10 mg p.o. daily. 8. Neurontin 300 mg p.o. at bedtime. 9. Keppra 500 mg p.o. b.i.d. 10.Toprol-XL 25 mg p.o. daily. 11.Protonix 40 mg p.o. at bedtime. 12.Trazodone 25 mg p.o. at bedtime. 13.Hydrocodone 1 tablet every 4 hours as needed for pain.  DIET:  His diet was a regular consistency, 1200 mL fluid restriction.  FOLLOWUP:  The patient would follow up  with Dr. Alysia Penna at the [facility] as directed; Dr. Newman Pies, 2 weeks, call for appointment; Dr. Fransico Him, Cardiology Services, call for appointment; and Dr. Lajean Manes, Medical Management.  SPECIAL INSTRUCTIONS:  No driving.     Lauraine Rinne, P.A.   ______________________________ Charlett Blake, M.D.    DA/MEDQ  D:  03/04/2018  T:  03/04/2018  Job:  [id number]  cc:   Fransico Him, M.D. Ophelia Charter, M.D. Charlett Blake, M.D. Hal T. Stoneking, M.D.

## 2018-03-04 NOTE — Progress Notes (Signed)
Speech Language Pathology Discharge Summary  Patient Details  Name: Ryan Savage MRN: [id number] Date of Birth: 01-07-1929  Today's Date: 03/04/2018 SLP Individual Time: 1000-1030 SLP Individual Time Calculation (min): 30 min   Skilled Therapeutic Interventions:   Skilled ST services focused on cognitive skills. SLP facilitated re assessment of cognitive linguistic skills with [facility] version 7.3 pt scored the same score of 21 out 30 (n=>26) as on evaluation date, however demonstrated functional changes in compensatory staretgies. SLP reviewed compensatory strategies and need for continued skilled ST services, pt and stated understanding and agreement. Pt was left in room with call bell within reach.    Patient has met 4 of 4 long term goals.  Patient to discharge at overall Supervision;Min level.  Reasons goals not met:     Clinical Impression/Discharge Summary:   Pt made great progress meeting 4 out 4 goals, discharging at min-supervision A. Pt continues to require assistance in basic-mildly complex problem solving task, recall strategies, higher level attention skills and error correction in functional tasks. Pt has made functional gains in these areas, however would benefit from continued skilled ST services in order to maximize functional independence and reduce burden of care prior to discharge.   Care Partner:  Caregiver Able to Provide Assistance: Yes  Type of Caregiver Assistance: Cognitive  Recommendation:  Outpatient SLP;24 hour supervision/assistance  Rationale for SLP Follow Up: Maximize cognitive function and independence;Reduce caregiver burden   Equipment:   N/A  Reasons for discharge: Discharged from hospital   Patient/Family Agrees with Progress Made and Goals Achieved: Yes   Function:  Eating Eating   Modified Consistency Diet: No Eating Assist Level: No help, No cues           Cognition Comprehension Comprehension assist level: Understands complex 90% of  the time/cues 10% of the time  Expression   Expression assist level: Expresses basic 90% of the time/requires cueing < 10% of the time.  Social Interaction Social Interaction assist level: Interacts appropriately with others - No medications needed.  Problem Solving Problem solving assist level: Solves basic 75 - 89% of the time/requires cueing 10 - 24% of the time;Solves basic 90% of the time/requires cueing < 10% of the time  Memory Memory assist level: Recognizes or recalls 75 - 89% of the time/requires cueing 10 - 24% of the time   Mahlia Fernando  [facility] 03/04/2018, 5:20 PM

## 2018-03-04 NOTE — Plan of Care (Signed)
  Problem: Consults Goal: RH BRAIN INJURY PATIENT EDUCATION Description Description: See Patient Education module for eduction specifics Outcome: Progressing   Problem: RH BOWEL ELIMINATION Goal: RH STG MANAGE BOWEL WITH ASSISTANCE Description STG Manage Bowel with MOD I Assistance.  Outcome: Progressing Goal: RH STG MANAGE BOWEL W/MEDICATION W/ASSISTANCE Description STG Manage Bowel with Medication with  Mod I Assistance.  Outcome: Progressing   Problem: RH BLADDER ELIMINATION Goal: RH STG MANAGE BLADDER WITH ASSISTANCE Description STG Manage Bladder With  Min Assistance  Outcome: Progressing Goal: RH STG MANAGE BLADDER WITH MEDICATION WITH ASSISTANCE Description STG Manage Bladder With Medication With Mod I Assistance.  Outcome: Progressing   Problem: RH SKIN INTEGRITY Goal: RH STG SKIN FREE OF INFECTION/BREAKDOWN Description Free from breakdown with mod assist  Outcome: Progressing Goal: RH STG MAINTAIN SKIN INTEGRITY WITH ASSISTANCE Description STG Maintain Skin Integrity With Assistance. Mod  Outcome: Progressing   Problem: RH SAFETY Goal: RH STG ADHERE TO SAFETY PRECAUTIONS W/ASSISTANCE/DEVICE Description STG Adhere to Safety Precautions With cues, reminders Assistance/Device.  Outcome: Progressing   Problem: RH KNOWLEDGE DEFICIT BRAIN INJURY Goal: RH STG INCREASE KNOWLEDGE OF SELF CARE AFTER BRAIN INJURY Description Pt and wife will be able to manage care after discharge using cues/reminders/handouts independently  Outcome: Progressing

## 2018-03-04 NOTE — Patient Care Conference (Signed)
Inpatient RehabilitationTeam Conference and Plan of Care Update Date: 03/04/2018   Time: 10:50 AM    Patient Name: Ryan Savage      Medical Record Number: [id number]  Date of Birth: [birth year] Sex: Male         Room/Bed: 4W15C/4W15C-01 Payor Info: Payor: [employer] MEDICARE / Plan: [employer] MEDICARE / Product Type: *No Product type* /    Admitting Diagnosis: CVA  Admit Date/Time:  02/24/2018  5:33 PM Admission Comments: No comment available   Primary Diagnosis:  <principal problem not specified> Principal Problem: <principal problem not specified>  Patient Active Problem List   Diagnosis Date Noted  . Traumatic subdural hematoma ([place]) 02/24/2018  . Acute blood loss anemia   . Steroid-induced hyperglycemia   . 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 Chronic systolic CHF (congestive heart failure), NYHA class 2 ([place]) 02/20/2018  . Hypertension 02/20/2018  . Chronic atrial fibrillation ([place]) 02/20/2018  . Coronary artery disease 02/20/2018  . Mild mitral valve regurgitation 02/20/2018  . Subdural hematoma ([place]) 02/20/2018  . [name] (coronary artery disease), native coronary artery   . SDH (subdural hematoma) ([place]) 01/25/2018  . Bilateral subdural hematomas ([place]) 01/23/2018  . Malnutrition of moderate degree 12/10/2017  . Cerebral embolism with cerebral infarction 12/09/2017  . Odynophagia 12/08/2017  . Syncope and collapse 12/08/2017  . Hyperlipidemia 12/08/2017  . OSA (obstructive sleep apnea) 12/08/2017  . Dysarthria 12/08/2017  . [name] (coronary artery disease) 12/08/2017  . Chronic systolic heart failure ([place]) 12/08/2017  . Syndrome of inappropriate ADH (SIADH) secretion ([place]) 12/08/2017  . SIADH (syndrome of inappropriate ADH production) ([place])   . Subclinical hyperthyroidism   . Hyponatremia with excess extracellular fluid volume 11/18/2017  . Hyperglycemia 11/18/2017  . Supratherapeutic INR 11/18/2017  . Mitral valve prolapse   . Permanent atrial fibrillation ([place]) 11/08/2013  . Benign  essential HTN 11/08/2013  . Mitral regurgitation 11/08/2013    Expected Discharge Date: Expected Discharge Date: 03/05/18  Team Members Present: Physician leading conference: Dr. Delice Lesch Social Worker Present: Ovidio Kin, LCSW Nurse Present: Rayetta Pigg, RN PT Present: Lavone Nian, PT OT Present: Napoleon Form, OT SLP Present: Charolett Bumpers, SLP PPS Coordinator present : Daiva Nakayama, RN, CRRN     Current Status/Progress Goal Weekly Team Focus  Medical   Patient making cognitive gains.  Sleep showing signs of improvement.  Eating well.  Pain minimal.  Sodium has recovered  Stabilized medically for discharge      Bowel/Bladder   continent of bowel and bladder can be incontinent of bladder at times LBM 4-21  Remain continent of bowel and bladder, maintain regular bowel pattern  Assist with toileting needs prn    Swallow/Nutrition/ Hydration             ADL's   Supervision Overall, VCs for safety and RW management  Supervision overall  d/c planning, family ed, pt d/c home tomorrow at supervision level   Mobility   close supervision overall with RW, impaired cognition/memory  supervision overall with LRAD  NMR, pt/family education, d/c planning, transfers, gait, balance, strengthening   Communication             Safety/Cognition/ Behavioral Observations  Min-supervision A  supervision-min assist   education, anticipatory awareness, problem solving, recall and selective attention   Pain   no c/o pain  <2  Assess pain q shift and prn    Skin   incision to head OTA, bruising to BUE  no new skin infection/breakdown  Assess skin q shift and  prn      *See Care Plan and progress notes for long and short-term goals.     Barriers to Discharge  Current Status/Progress Possible Resolutions Date Resolved   Physician             Continue medical management as outlined in the chart.  Patient will have outpatient follow-up as well      Nursing                  PT                     OT                  SLP                SW                Discharge Planning/Teaching Needs:  Home with wife who has been through family training and is prepared for discharge tomorrow. She wants him walking more to get his strength in his legs back      Team Discussion:  Reaching supervision level goals and preparing for discharge tomorrow. Weaning decadron and sleeping better with trazodone. Family education completed with wife and aware of his needs for cues. Wife to provide 24 hr supervision at home.  Revisions to Treatment Plan:  DC 4/25    Continued Need for Acute Rehabilitation Level of Care: The patient requires daily medical management by a physician with specialized training in physical medicine and rehabilitation for the following conditions: Daily direction of a multidisciplinary physical rehabilitation program to ensure safe treatment while eliciting the highest outcome that is of practical value to the patient.: Yes Daily medical management of patient stability for increased activity during participation in an intensive rehabilitation regime.: Yes Daily analysis of laboratory values and/or radiology reports with any subsequent need for medication adjustment of medical intervention for : Post surgical problems;Neurological problems  [name], Gardiner Rhyme 03/04/2018, 2:22 PM

## 2018-03-04 NOTE — Progress Notes (Signed)
Social Work Patient ID: Ryan Savage, male   DOB: 10-07-1929, 84 y.o.   MRN: [id number]  Met with pt and wife to discuss team conference progress toward his goals and discharge tomorrow. Pt and wife feel ready and are pleased with the progress he has made here. Has all equipment and home health arranged. See in am for last minute questions.

## 2018-03-05 DIAGNOSIS — R7989 Other specified abnormal findings of blood chemistry: Secondary | ICD-10-CM

## 2018-03-05 LAB — BASIC METABOLIC PANEL
Anion gap: 6 (ref 5–15)
BUN: 32 mg/dL — ABNORMAL HIGH (ref 6–20)
CO2: 31 mmol/L (ref 22–32)
Calcium: 8.2 mg/dL — ABNORMAL LOW (ref 8.9–10.3)
Chloride: 92 mmol/L — ABNORMAL LOW (ref 101–111)
Creatinine, Ser: 0.82 mg/dL (ref 0.61–1.24)
GFR calc Af Amer: >60 mL/min (ref >60)
GFR calc non Af Amer: >60 mL/min (ref >60)
Glucose, Bld: 108 mg/dL — ABNORMAL HIGH (ref 65–99)
Potassium: 4.7 mmol/L (ref 3.5–5.1)
Sodium: 129 mmol/L — ABNORMAL LOW (ref 135–145)

## 2018-03-05 MED ORDER — ATORVASTATIN CALCIUM 20 MG PO TABS: 20.0000 mg | ORAL_TABLET | Freq: Every evening | ORAL | 0 refills | Status: DC

## 2018-03-05 MED ORDER — DIGOXIN 125 MCG PO TABS: 0.1250 mg | ORAL_TABLET | Freq: Every day | ORAL | 0 refills | Status: DC

## 2018-03-05 MED ORDER — FINASTERIDE 5 MG PO TABS: 5.0000 mg | ORAL_TABLET | Freq: Every day | ORAL | 0 refills | Status: DC

## 2018-03-05 MED ORDER — DEXAMETHASONE 1 MG PO TABS: ORAL_TABLET | ORAL | 0 refills | Status: DC

## 2018-03-05 MED ORDER — DOCUSATE SODIUM 100 MG PO CAPS: 100.0000 mg | ORAL_CAPSULE | Freq: Every day | ORAL | 0 refills | Status: DC | PRN

## 2018-03-05 MED ORDER — PANTOPRAZOLE SODIUM 40 MG PO TBEC: 40.0000 mg | DELAYED_RELEASE_TABLET | Freq: Every evening | ORAL | 0 refills | Status: DC

## 2018-03-05 MED ORDER — LEVETIRACETAM 500 MG PO TABS: 500.0000 mg | ORAL_TABLET | Freq: Two times a day (BID) | ORAL | 0 refills | Status: DC

## 2018-03-05 MED ORDER — TRAZODONE HCL 50 MG PO TABS: 25.0000 mg | ORAL_TABLET | Freq: Every evening | ORAL | 0 refills | Status: DC | PRN

## 2018-03-05 MED ORDER — METOPROLOL SUCCINATE ER 25 MG PO TB24: 25.0000 mg | ORAL_TABLET | Freq: Every day | ORAL | 0 refills | Status: DC

## 2018-03-05 MED ORDER — GABAPENTIN 300 MG PO CAPS: 300.0000 mg | ORAL_CAPSULE | Freq: Every day | ORAL | 0 refills | Status: DC

## 2018-03-05 NOTE — Progress Notes (Signed)
Pt left unit via WC with family, NT, and belongings. Discharge instructions given by P. Love. Pt verbalizes understanding. Claude Manges, LPN

## 2018-03-05 NOTE — Plan of Care (Signed)
Pt met goals

## 2018-03-05 NOTE — Progress Notes (Signed)
Subjective/Complaints: Patient in chair.  Excited to be going home.  Did not sleep as well last night, only 2 hours.  ROS: Patient denies fever, rash, sore throat, blurred vision, nausea, vomiting, diarrhea, cough, shortness of breath or chest pain, joint or back pain, headache, or mood change.   Objective: Vital Signs: Blood pressure (!) 108/58, pulse 63, temperature 97.6 F (36.4 C), temperature source Oral, resp. rate 18, height 5' 10"  (1.778 m), weight 74.4 kg (164 lb 0.4 oz), SpO2 100 %. No results found. Results for orders placed or performed during the hospital encounter of 02/24/18 (from the past 72 hour(s))  Basic metabolic panel     Status: Abnormal   Collection Time: 03/05/18  6:10 AM  Result Value Ref Range   Sodium 129 (L) 135 - 145 mmol/L   Potassium 4.7 3.5 - 5.1 mmol/L   Chloride 92 (L) 101 - 111 mmol/L   CO2 31 22 - 32 mmol/L   Glucose, Bld 108 (H) 65 - 99 mg/dL   BUN 32 (H) 6 - 20 mg/dL   Creatinine, Ser 0.82 0.61 - 1.24 mg/dL   Calcium 8.2 (L) 8.9 - 10.3 mg/dL   GFR calc non Af Amer >60 >60 mL/min   GFR calc Af Amer >60 >60 mL/min    Comment: (NOTE) The eGFR has been calculated using the CKD EPI equation. This calculation has not been validated in all clinical situations. eGFR's persistently <60 mL/min signify possible Chronic Kidney Disease.    Anion gap 6 5 - 15    Comment: Performed at [facility] [address], [place], [place] [address]     Constitutional: No distress . Vital signs reviewed. HEENT: EOMI, oral membranes moist Neck: supple Cardiovascular: RRR without murmur. No JVD    Respiratory: CTA Bilaterally without wheezes or rales. Normal effort    GI: BS +, non-tender, non-distended  Neuro: Abnormal Sensory pt states LE LT sensation is equal, Abnormal Motor 4- R delt Bi Tri Grip, 5/5 in LUE, 4/5 BLE HF, KE <ADF.  Oriented to person and place.  Impulsive.  Decreased insight and awareness.  Musc/Skel:  Other no pain with UE or LE  ROM Gen: Restless   Assessment/Plan: 1. Functional deficits secondary to Left SDH which require 3+ hours per day of interdisciplinary therapy in a comprehensive inpatient rehab setting. Physiatrist is providing close team supervision and 24 hour management of active medical problems listed below. Physiatrist and rehab team continue to assess barriers to discharge/monitor patient progress toward functional and medical goals.  FIM: Function - Bathing Position: Shower Body parts bathed by patient: Right arm, Right lower leg, Left arm, Left lower leg, Chest, Abdomen, Front perineal area, Buttocks, Right upper leg, Left upper leg, Back Body parts bathed by helper: Back Assist Level: Supervision or verbal cues  Function- Upper Body Dressing/Undressing What is the patient wearing?: Pull over shirt/dress Pull over shirt/dress - Perfomed by patient: Thread/unthread right sleeve, Thread/unthread left sleeve, Put head through opening, Pull shirt over trunk Button up shirt - Perfomed by patient: Thread/unthread right sleeve, Thread/unthread left sleeve, Pull shirt around back, Button/unbutton shirt Assist Level: Supervision or verbal cues Function - Lower Body Dressing/Undressing What is the patient wearing?: Underwear, Socks, Shoes, Pants Position: Wheelchair/chair at [employer] - Performed by patient: Thread/unthread right underwear leg, Thread/unthread left underwear leg, Pull underwear up/down Pants- Performed by patient: Thread/unthread right pants leg, Thread/unthread left pants leg, Pull pants up/down, Fasten/unfasten pants Socks - Performed by patient: Don/doff right sock,  Don/doff left sock Shoes - Performed by patient: Don/doff right shoe, Don/doff left shoe, Fasten right, Fasten left Shoes - Performed by helper: Don/doff right shoe, Don/doff left shoe, Fasten right, Fasten left Assist for footwear: Independent Assist for lower body dressing: Supervision or verbal cues  Function -  Toileting Toileting steps completed by patient: Adjust clothing prior to toileting, Performs perineal hygiene, Adjust clothing after toileting Toileting steps completed by helper: Performs perineal hygiene, Adjust clothing after toileting Toileting Assistive Devices: Grab bar or rail Assist level: Supervision or verbal cues  Function - Air cabin crew transfer assistive device: Grab bar, Walker Assist level to toilet: Supervision or verbal cues Assist level from toilet: Supervision or verbal cues  Function - Chair/bed transfer Chair/bed transfer method: Ambulatory Chair/bed transfer assist level: Supervision or verbal cues Chair/bed transfer assistive device: Walker Chair/bed transfer details: Verbal cues for precautions/safety, Verbal cues for safe use of DME/AE, Verbal cues for technique  Function - Locomotion: Wheelchair Will patient use wheelchair at discharge?: No Wheelchair activity did not occur: N/A Wheel 50 feet with 2 turns activity did not occur: N/A Wheel 150 feet activity did not occur: N/A Function - Locomotion: Ambulation Assistive device: Walker-rolling Max distance: 150 ft Assist level: Supervision or verbal cues Assist level: Supervision or verbal cues Assist level: Supervision or verbal cues Walk 150 feet activity did not occur: Safety/medical concerns(fatigued) Assist level: Supervision or verbal cues Walk 10 feet on uneven surfaces activity did not occur: Safety/medical concerns Assist level: Supervision or verbal cues  Function - Comprehension Comprehension: Auditory Comprehension assist level: Understands complex 90% of the time/cues 10% of the time  Function - Expression Expression: Verbal Expression assist level: Expresses basic 90% of the time/requires cueing < 10% of the time.  Function - Social Interaction Social Interaction assist level: Interacts appropriately with others - No medications needed.  Function - Problem Solving Problem  solving assist level: Solves basic 75 - 89% of the time/requires cueing 10 - 24% of the time, Solves basic 90% of the time/requires cueing < 10% of the time  Function - Memory Memory assist level: Recognizes or recalls 75 - 89% of the time/requires cueing 10 - 24% of the time Patient normally able to recall (first 3 days only): Location of own room, Staff names and faces, That he or she is in a hospital, Current season  Medical Problem List and Plan: 1.Decreased functional mobilitysecondary toenlarging left subdural hematoma. 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  -CIR PT, OT, SLP--   -Patient to see Rehab MD/provider in the office for transitional care encounter in 1-2 weeks.  2. DVT Prophylaxis/Anticoagulation: SCDs.   3. Pain Management:Hydrocodone as needed 4. Mood:Provide emotional support 5. Neuropsych: This patientiscapable of making decisions on hisown behalf. 6. Skin/Wound Care:Routine skin checks 7. Fluids/Electrolytes/Nutrition:Routinein and outswith follow-up chemistries 8.Chronic systolic congestive heart failure.   BUN is trending up.  Hold Lasix for now  -Follow-up with primary regarding fluid balance and need to resume Lasix.  Only on 10 mg currently.  -weights stable at present [facility] Weights   03/02/18 0500 03/03/18 0022 03/04/18 0300  Weight: 74 kg (163 lb 3.2 oz) 74.3 kg (163 lb 12.8 oz) 74.4 kg (164 lb 0.4 oz)      9.Chronic atrial fibrillation. Not a candidate for anticoagulation due to SDH. Continue digoxin 0.125 mg daily as well as Toprol 25 mg daily 10.Seizure prophylaxis. Keppra 500 mg  twice daily. 11.Hyponatremia. Fluid restriction. 1200, intake is ~1150m-   -sodium 127 on 4/22--> up to 129 today-  12.BPH. Pro-scar 5 mg daily. D/C  oxybutnin at noc given no improvement with  nocturia 13.Hyperlipidemia. Lipitor 14.Constipation. Laxative assistance 15.GERD. Protonix 16.  Hypoalb start prostat 17 insomnia chronic.  Discussed with patient and he would like to increase trazodone to 50 mg nightly.  We will send him home at this dose   18.  Cerebral edema prophyllaxis high dose decadron===  -tapering to off. Has been on [id number] since Sunday--reduce to [id number]    -Taper Decadron to off as an outpatient.  LOS (Days) 9 A FACE TO FACE EVALUATION WAS PERFORMED  ZacMeredith Staggers25/2019, 9:39 AM

## 2018-03-05 NOTE — Progress Notes (Signed)
Social Work  Discharge Note  The overall goal for the admission was met for:   Discharge location: Yes-HOME WITH WIFE WHO CAN PROVIDE SUPERVISION  Length of Stay: Yes-9 DAYS  Discharge activity level: Yes-SUPERVISION LEVEL  Home/community participation: Yes  Services provided included: MD, RD, PT, OT, SLP, RN, CM, TR, Pharmacy, Neuropsych and SW  Financial Services: Private Insurance: [facility]  Follow-up services arranged: Home Health: [facility]-PT,OT,SP and Patient/Family request agency HH: HAD BEFORE, DME: NO NEEDS  Comments (or additional information):WIFE HAS BEEN IN FOR EDUCATION AND [place]. PREFERRED HOME HEALTH THERAPIES.  Patient/Family verbalized understanding of follow-up arrangements: Yes  Individual responsible for coordination of the follow-up plan: SHIRLEY-WIFE  Confirmed correct DME delivered: Elease Hashimoto 03/05/2018    Elease Hashimoto

## 2018-03-05 NOTE — Discharge Instructions (Signed)
Inpatient Rehab Discharge Instructions  [facility] Discharge date and time: 03/05/18   Activities/Precautions/ Functional Status: Activity: no lifting, driving, or strenuous exercise till cleared by MD Diet: Heart Healthy. Drink at least 5 cups water daily for hydration.  Wound Care: keep wound clean and dry   Functional status:  ___ No restrictions     ___ Walk up steps independently _X__ 24/7 supervision/assistance   ___ Walk up steps with assistance ___ Intermittent supervision/assistance  ___ Bathe/dress independently ___ Walk with walker     _x__ Bathe/dress with assistance ___ Walk Independently    ___ Shower independently ___ Walk with assistance    ___ Shower with assistance _X__ No alcohol     ___ Return to work/school ________   Special Instructions: 1. No driving. No alcohol.  2. Family needs to assist with medication management/administration.  3. You need to increase fluid intake. Will need to have renal function rechecked at MD office.    COMMUNITY REFERRALS UPON DISCHARGE:    Home Health:   PT, OT, [place]   Date of last service:03/05/2018  Medical Equipment/Items Ordered:HAS ALL NEEDED EQUIPMENT FROM PREVIOUS ADMITS   GENERAL COMMUNITY RESOURCES FOR PATIENT/FAMILY: Support Groups:[facility] SUPPORT GROUP  EVERY SECOND Thursday (SEPT-MAY) 2 3:00-4:00 PM ON THE REHAB UNIT QUESTIONS CONTACT [place] [phone]  My questions have been answered and I understand these instructions. I will adhere to these goals and the provided educational materials after my discharge from the hospital.  Patient/Caregiver Signature _______________________________ Date __________  Clinician Signature _______________________________________ Date __________  Please bring this form and your medication list with you to all your follow-up doctor's appointments.

## 2018-03-09 ENCOUNTER — Other Ambulatory Visit (HOSPITAL_COMMUNITY): Payer: Self-pay | Admitting: Neurosurgery

## 2018-03-09 ENCOUNTER — Telehealth: Payer: Self-pay | Admitting: Registered Nurse

## 2018-03-09 DIAGNOSIS — S065XAA Traumatic subdural hemorrhage with loss of consciousness status unknown, initial encounter: Secondary | ICD-10-CM

## 2018-03-09 DIAGNOSIS — S065X9A Traumatic subdural hemorrhage with loss of consciousness of unspecified duration, initial encounter: Secondary | ICD-10-CM

## 2018-03-09 NOTE — Telephone Encounter (Signed)
Transitional Care call Transitional Care Call Completed, Appointment Confirmed, Address Confirmed, New Patient Packet Mailed Transitional Care Call Questions answered by [name]  Patient name: Ryan Savage  DOB: 07-Jan-1929 1. Are you/is patient experiencing any problems since coming home? No a. Are there any questions regarding any aspect of care? No 2. Are there any questions regarding medications administration/dosing? No a. Are meds being taken as prescribed? Yes b. "Patient should review meds with caller to confirm"  Medication List Reviewed 3. Have there been any falls? No 4. Has Home Health been to the house and/or have they contacted you? Yes, [facility]  a. If not, have you tried to contact them? NA b. Can we help you contact them? NA 5. Are bowels and bladder emptying properly? Yes a. Are there any unexpected incontinence issues? No b. If applicable, is patient following bowel/bladder programs? NA 6. Any fevers, problems with breathing, unexpected pain? No 7. Are there any skin problems or new areas of breakdown? No 8. Has the patient/family member arranged specialty MD follow up (ie cardiology/neurology/renal/surgical/etc.)?  ll appointments Have been scheduled  a. Can we help arrange? NA 9. Does the patient need any other services or support that we can help arrange? No 10. Are caregivers following through as expected in assisting the patient? es 54. Has the patient quit smoking, drinking alcohol, or using drugs as recommended? [name] reports [name] doesn't smoke, drink alcohol or use illicit drugs.   Appointment date/time 03/12/2018 arrival at 11:00 for 11:20 appointment, with Danella Sensing ANP-C. At [facility]

## 2018-03-10 NOTE — Discharge Summary (Signed)
Physician Discharge Summary  Patient ID: Ryan Savage MRN: [id number] DOB/AGE: [id number] 84 y.o. Estimated body mass index is 27.77 kg/m as calculated from the following:   Height as of this encounter: 5\' 10"  (1.778 m).   Weight as of this encounter: 87.8 kg (193 lb 9 oz).   Admit date: 01/23/2018 Discharge date: 01/29/2018  Admission Diagnoses: subdural hematoma bilateral  Discharge Diagnoses:  same Principal Problem:   Bilateral subdural hematomas (HCC) Active Problems:   Permanent atrial fibrillation (HCC)   Benign essential HTN   Mitral regurgitation   Hyperglycemia   SIADH (syndrome of inappropriate ADH production) (HCC)   Subclinical hyperthyroidism   OSA (obstructive sleep apnea)   Chronic systolic heart failure (HCC)   Cerebral embolism with cerebral infarction   Malnutrition of moderate degree   SDH (subdural hematoma) ([place])   Discharged Condition: good  Hospital Course:  84 year old gentleman admitted through the emergency room with bilateral subacute to chronic subdural hematomas.  When he came in he was anticoagulated on Coumadin was observed and reversed from his coagulopathy.  By hospital day 3 his coagulopathy was adequately reversed he was taken to the OR and underwent bilateral burr hole craniectomy for evacuation of subdural.  Postoperatively patient did fairly well convalescing recovered well initially in the ICU with a drain placed and then subsequently to the step-down progressive unit and by hospital day 6 patient was stable for discharge to  Home.  Consults: Significant Diagnostic Studies: Treatments:  Bilateral burr hole craniectomy for subdural hematoma. Discharge Exam: Blood pressure 135/90, pulse 88, temperature 97.8 F (36.6 C), temperature source Oral, resp. rate 19, height 5\' 10"  (1.778 m), weight 87.8 kg (193 lb 9 oz), SpO2 97 %.   Strength 5/5 1 clean dry and intact.  Disposition:   Home   Allergies as of 01/29/2018      Reactions   Novocain [procaine] Rash      Medication List    STOP taking these medications   acetaminophen 500 MG tablet Commonly known as:  TYLENOL   aspirin 325 MG EC tablet   multivitamin capsule   warfarin 3 MG tablet Commonly known as:  COUMADIN     TAKE these medications   fluticasone 50 MCG/ACT nasal spray Commonly known as:  FLONASE Place 1 spray into both nostrils daily.   lactulose 10 GM/15ML solution Commonly known as:  CHRONULAC Take 15 mLs (10 g total) by mouth 2 (two) times daily as needed for mild constipation.        Signed: Kida Digiulio P 03/10/2018, 1:46 PM

## 2018-03-11 ENCOUNTER — Telehealth: Payer: Self-pay | Admitting: *Deleted

## 2018-03-11 NOTE — Telephone Encounter (Signed)
Ryan Savage, ST, [place] left a message asking for verbal orders  for 1 additional visit to reinforce word finding strategies.  Contacted Electrical engineer and gave verbal orders per office protocol

## 2018-03-12 ENCOUNTER — Encounter: Payer: Medicare Other | Attending: Registered Nurse | Admitting: Registered Nurse

## 2018-03-12 ENCOUNTER — Encounter: Payer: Self-pay | Admitting: Registered Nurse

## 2018-03-12 ENCOUNTER — Encounter: Payer: Medicare Other | Admitting: Registered Nurse

## 2018-03-12 VITALS — BP 106/68 | HR 65 | Resp 14 | Ht 70.0 in | Wt 165.0 lb

## 2018-03-12 DIAGNOSIS — I6200 Nontraumatic subdural hemorrhage, unspecified: Secondary | ICD-10-CM | POA: Insufficient documentation

## 2018-03-12 DIAGNOSIS — K219 Gastro-esophageal reflux disease without esophagitis: Secondary | ICD-10-CM | POA: Diagnosis not present

## 2018-03-12 DIAGNOSIS — N400 Enlarged prostate without lower urinary tract symptoms: Secondary | ICD-10-CM | POA: Diagnosis not present

## 2018-03-12 DIAGNOSIS — I2510 Atherosclerotic heart disease of native coronary artery without angina pectoris: Secondary | ICD-10-CM | POA: Diagnosis not present

## 2018-03-12 DIAGNOSIS — S065XAA Traumatic subdural hemorrhage with loss of consciousness status unknown, initial encounter: Secondary | ICD-10-CM

## 2018-03-12 DIAGNOSIS — I482 Chronic atrial fibrillation, unspecified: Secondary | ICD-10-CM

## 2018-03-12 DIAGNOSIS — E871 Hypo-osmolality and hyponatremia: Secondary | ICD-10-CM | POA: Diagnosis not present

## 2018-03-12 DIAGNOSIS — Z8673 Personal history of transient ischemic attack (TIA), and cerebral infarction without residual deficits: Secondary | ICD-10-CM | POA: Diagnosis not present

## 2018-03-12 DIAGNOSIS — I110 Hypertensive heart disease with heart failure: Secondary | ICD-10-CM | POA: Insufficient documentation

## 2018-03-12 DIAGNOSIS — G4700 Insomnia, unspecified: Secondary | ICD-10-CM | POA: Diagnosis not present

## 2018-03-12 DIAGNOSIS — S065X9A Traumatic subdural hemorrhage with loss of consciousness of unspecified duration, initial encounter: Secondary | ICD-10-CM | POA: Diagnosis not present

## 2018-03-12 DIAGNOSIS — I5022 Chronic systolic (congestive) heart failure: Secondary | ICD-10-CM | POA: Diagnosis not present

## 2018-03-12 DIAGNOSIS — G4733 Obstructive sleep apnea (adult) (pediatric): Secondary | ICD-10-CM | POA: Insufficient documentation

## 2018-03-12 DIAGNOSIS — I10 Essential (primary) hypertension: Secondary | ICD-10-CM

## 2018-03-12 DIAGNOSIS — E785 Hyperlipidemia, unspecified: Secondary | ICD-10-CM | POA: Insufficient documentation

## 2018-03-12 DIAGNOSIS — I341 Nonrheumatic mitral (valve) prolapse: Secondary | ICD-10-CM | POA: Insufficient documentation

## 2018-03-12 NOTE — Progress Notes (Signed)
Subjective:    Patient ID: Ryan Savage, male    DOB: Nov 26, 1928, 84 y.o.   MRN: [id number]  HPI: [name] is a 84 year male who is here for transitional care visit in follow up of his subdural hematoma, chronic atrial fibrillation, hyponatremia, hypertension and insomnia. He's been home with home health therapies with [facility]. He denies any pain.  Also reports his appetite is improving.   [name] refuses BMP, would like for PCP to follow up, per his and wife request.  Wife and son in room.   Pain Inventory Average Pain 1 Pain Right Now 0 My pain is no pain  In the last 24 hours, has pain interfered with the following? General activity 0 Relation with others 0 Enjoyment of life 0 What TIME of day is your pain at its worst? no pain Sleep (in general) Poor  Pain is worse with: no pain Pain improves with: no pain Relief from Meds: no pain  Mobility walk with assistance use a walker how many minutes can you walk? 15 ability to climb steps?  no do you drive?  no Do you have any goals in this area?  yes  Function retired I need assistance with the following:  meal prep, household duties and shopping Do you have any goals in this area?  yes  Neuro/Psych bladder control problems bowel control problems numbness trouble walking  Prior Studies transitional  Physicians involved in your care transitional   Family History  Problem Relation Age of Onset  . Heart failure Mother   . Heart attack Father   . [name] Father   . Heart attack Brother   . [name] Brother   . Colon cancer Brother   . Colon cancer Sister    Social History   Socioeconomic History  . Marital status: Married    Spouse name: Not on file  . Number of children: Not on file  . Years of education: Not on file  . Highest education level: Not on file  Occupational History  . Occupation: retired  Scientific laboratory technician  . Financial resource strain: Not on file  . Food insecurity:    Worry: Not on file    Inability: Not on file  . Transportation needs:    Medical: Not on file    Non-medical: Not on file  Tobacco Use  . Smoking status: Former Smoker    Years: 3.00    Types: Cigarettes    Last attempt to quit: 1958    Years since quitting: 61.3  . Smokeless tobacco: Never Used  Substance and Sexual Activity  . Alcohol use: No  . Drug use: No  . Sexual activity: Not on file  Lifestyle  . Physical activity:    Days per week: Not on file    Minutes per session: Not on file  . Stress: Not on file  Relationships  . Social connections:    Talks on phone: Not on file    Gets together: Not on file    Attends religious service: Not on file    Active member of club or organization: Not on file    Attends meetings of clubs or organizations: Not on file    Relationship status: Not on file  Other Topics Concern  . Not on file  Social History Narrative  . Not on file   Past Surgical History:  Procedure Laterality Date  . APPENDECTOMY  1950  . BACK SURGERY    . BURR  HOLE Bilateral 01/25/2018   Procedure: Haskell Flirt;  Surgeon: Kary Kos, MD;  Location: [facility];  Service: Neurosurgery;  Laterality: Bilateral;  . BURR HOLE Left 02/20/2018   Procedure: BURR HOLES for subdural hematoma;  Surgeon: Newman Pies, MD;  Location: [facility];  Service: Neurosurgery;  Laterality: Left;  . FOREARM FRACTURE SURGERY Right 1954  . LUMBAR DISC SURGERY  1959   ruptured disc repair  . RIGHT/LEFT HEART CATH AND CORONARY ANGIOGRAPHY N/A 11/21/2017   Procedure: RIGHT/LEFT HEART CATH AND CORONARY ANGIOGRAPHY;  Surgeon: Sherren Mocha, MD;  Location: [facility];  Service: Cardiovascular;  Laterality: N/A;  . SKIN CANCER EXCISION Left    "side of my nose"   Past Medical History:  Diagnosis Date  . Atrial fibrillation, chronic (HCC)    Not on anticoagulation due to history of bilateral subdural bleeds due to recurrent falls  . BPH (benign prostatic hypertrophy)   . [name] (coronary  artery disease), native coronary artery    Severe two-vessel [name] with chronically occluded LAD and RCA with widely patent left main, intermediate, and left circumflex branches.  On medical management.  He has extensive collaterals.  . 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 fib with RVR  . [name] (degenerative joint disease)   . GERD (gastroesophageal reflux disease)   . Hyperlipidemia    LDL goal less then 100  . Hypertension   . Microscopic hematuria    [name]  . Mitral valve prolapse    with moderate MR  . Myocardial infarction [facility])    "was told 11/2017 that he'd had a heart attack; don't know when it was" (02/19/2018)  . OSA (obstructive sleep apnea)    intolerant with CPAP (02/19/2018)  . SDH (subdural hematoma) ([place]) 01/2018  . SIADH (syndrome of inappropriate ADH production) ([place])   . Skin cancer    "side of my nose" (11/19/2017)  . Stroke ([place]) 12/2017   "fully recovered"  . Thrombocytopenia (HCC)    Mild- platelet count 143,000 on 02/2011, stable 08/2011   BP 106/68   Pulse 65   Resp 14   Wt 165 lb (74.8 kg)   SpO2 97%   BMI 23.68 kg/m   Opioid Risk Score:   Fall Risk Score:  `1  Depression screen PHQ 2/9  Depression screen PHQ 2/9 03/12/2018  Decreased Interest 0  Down, Depressed, Hopeless 0  PHQ - 2 Score 0  Altered sleeping 3  Tired, decreased energy 3  Change in appetite 2  Feeling bad or failure about yourself  0  Trouble concentrating 0  Moving slowly or fidgety/restless 1  Suicidal thoughts 0  PHQ-9 Score 9  Difficult doing work/chores Somewhat difficult    Review of Systems  Constitutional: Positive for unexpected weight change.  HENT: Negative.   Eyes: Negative.   Respiratory: Positive for apnea.   Gastrointestinal: Positive for diarrhea.  Endocrine: Negative.   Genitourinary: Negative.     Musculoskeletal: Positive for gait problem.  Skin: Negative.   Allergic/Immunologic: Negative.   Neurological: Positive for numbness.  Hematological: Negative.   Psychiatric/Behavioral: Negative.        Objective:   Physical Exam  Constitutional: He is oriented to person, place, and time. He appears well-developed and well-nourished.  HENT:  Head: Normocephalic and atraumatic.  Neck: Normal range of motion. Neck supple.  Cardiovascular:  Normal rate and regular rhythm.  Pulmonary/Chest: Effort normal and breath sounds normal.  Musculoskeletal:  Normal Muscle Bulk and Muscle Testing Reveals: Upper Extremities: Full ROM and Muscle Strength 5/5 Lower Extremities: Full ROM and Muscle Strength 5/5 Arises from Table with ease using walker for support Narrow Based Gait   Neurological: He is alert and oriented to person, place, and time.  Skin: Skin is warm and dry.  Psychiatric: He has a normal mood and affect.  Nursing note and vitals reviewed.         Assessment & Plan:  1. Subdural Hematoma: Continue with home therapies: Neurology Following 2. Chronic Atrial Fibrillation: Continue current medication regimen. Cardiology Following 3. Hypertension: Continue current medication regimen. Cardiology Following Insomnia: Continue current medication regimen. PCP Following 4. Hyponatremia: Refuses BMP, Would like PCP to Follow.  30 minutes of face to face patient care time was spent during this visit. All questions were encouraged and answered.

## 2018-03-13 ENCOUNTER — Ambulatory Visit
Admission: RE | Admit: 2018-03-13 | Discharge: 2018-03-13 | Disposition: A | Discharge status: Home / Self Care | Payer: Medicare Other | Source: Ambulatory Visit | Attending: Neurosurgery | Admitting: Neurosurgery

## 2018-03-13 DIAGNOSIS — S065XAA Traumatic subdural hemorrhage with loss of consciousness status unknown, initial encounter: Secondary | ICD-10-CM

## 2018-03-13 DIAGNOSIS — S065X9A Traumatic subdural hemorrhage with loss of consciousness of unspecified duration, initial encounter: Secondary | ICD-10-CM

## 2018-03-19 ENCOUNTER — Emergency Department (HOSPITAL_COMMUNITY): Payer: Medicare Other

## 2018-03-19 ENCOUNTER — Other Ambulatory Visit: Payer: Self-pay

## 2018-03-19 ENCOUNTER — Encounter (HOSPITAL_COMMUNITY): Payer: Self-pay

## 2018-03-19 ENCOUNTER — Inpatient Hospital Stay (HOSPITAL_COMMUNITY)
Admission: EM | Admit: 2018-03-19 | Discharge: 2018-03-25 | DRG: 025 | Disposition: A | Discharge status: Rehabilitation Facility | Payer: Medicare Other | Attending: Neurological Surgery | Admitting: Neurological Surgery

## 2018-03-19 DIAGNOSIS — Z87891 Personal history of nicotine dependence: Secondary | ICD-10-CM

## 2018-03-19 DIAGNOSIS — I6201 Nontraumatic acute subdural hemorrhage: Secondary | ICD-10-CM | POA: Diagnosis present

## 2018-03-19 DIAGNOSIS — R296 Repeated falls: Secondary | ICD-10-CM | POA: Diagnosis present

## 2018-03-19 DIAGNOSIS — N400 Enlarged prostate without lower urinary tract symptoms: Secondary | ICD-10-CM | POA: Diagnosis present

## 2018-03-19 DIAGNOSIS — I252 Old myocardial infarction: Secondary | ICD-10-CM

## 2018-03-19 DIAGNOSIS — K219 Gastro-esophageal reflux disease without esophagitis: Secondary | ICD-10-CM | POA: Diagnosis present

## 2018-03-19 DIAGNOSIS — G4733 Obstructive sleep apnea (adult) (pediatric): Secondary | ICD-10-CM | POA: Diagnosis present

## 2018-03-19 DIAGNOSIS — D696 Thrombocytopenia, unspecified: Secondary | ICD-10-CM

## 2018-03-19 DIAGNOSIS — S065X9A Traumatic subdural hemorrhage with loss of consciousness of unspecified duration, initial encounter: Secondary | ICD-10-CM

## 2018-03-19 DIAGNOSIS — S065X0D Traumatic subdural hemorrhage without loss of consciousness, subsequent encounter: Secondary | ICD-10-CM

## 2018-03-19 DIAGNOSIS — I5022 Chronic systolic (congestive) heart failure: Secondary | ICD-10-CM | POA: Diagnosis present

## 2018-03-19 DIAGNOSIS — E785 Hyperlipidemia, unspecified: Secondary | ICD-10-CM | POA: Diagnosis present

## 2018-03-19 DIAGNOSIS — I2510 Atherosclerotic heart disease of native coronary artery without angina pectoris: Secondary | ICD-10-CM | POA: Diagnosis present

## 2018-03-19 DIAGNOSIS — Z8673 Personal history of transient ischemic attack (TIA), and cerebral infarction without residual deficits: Secondary | ICD-10-CM

## 2018-03-19 DIAGNOSIS — I110 Hypertensive heart disease with heart failure: Secondary | ICD-10-CM | POA: Diagnosis present

## 2018-03-19 DIAGNOSIS — I6203 Nontraumatic chronic subdural hemorrhage: Secondary | ICD-10-CM

## 2018-03-19 DIAGNOSIS — Z298 Encounter for other specified prophylactic measures: Secondary | ICD-10-CM

## 2018-03-19 DIAGNOSIS — Z8679 Personal history of other diseases of the circulatory system: Secondary | ICD-10-CM

## 2018-03-19 DIAGNOSIS — Z2989 Encounter for other specified prophylactic measures: Secondary | ICD-10-CM

## 2018-03-19 DIAGNOSIS — Z09 Encounter for follow-up examination after completed treatment for conditions other than malignant neoplasm: Secondary | ICD-10-CM

## 2018-03-19 DIAGNOSIS — S065XAA Traumatic subdural hemorrhage with loss of consciousness status unknown, initial encounter: Secondary | ICD-10-CM | POA: Diagnosis present

## 2018-03-19 DIAGNOSIS — R0682 Tachypnea, not elsewhere classified: Secondary | ICD-10-CM

## 2018-03-19 DIAGNOSIS — Z8249 Family history of ischemic heart disease and other diseases of the circulatory system: Secondary | ICD-10-CM

## 2018-03-19 DIAGNOSIS — E43 Unspecified severe protein-calorie malnutrition: Secondary | ICD-10-CM

## 2018-03-19 DIAGNOSIS — Z6822 Body mass index (BMI) 22.0-22.9, adult: Secondary | ICD-10-CM

## 2018-03-19 DIAGNOSIS — R531 Weakness: Secondary | ICD-10-CM | POA: Diagnosis not present

## 2018-03-19 DIAGNOSIS — D649 Anemia, unspecified: Secondary | ICD-10-CM

## 2018-03-19 DIAGNOSIS — I480 Paroxysmal atrial fibrillation: Secondary | ICD-10-CM | POA: Diagnosis present

## 2018-03-19 DIAGNOSIS — S065X0A Traumatic subdural hemorrhage without loss of consciousness, initial encounter: Principal | ICD-10-CM | POA: Diagnosis present

## 2018-03-19 DIAGNOSIS — E871 Hypo-osmolality and hyponatremia: Secondary | ICD-10-CM | POA: Diagnosis present

## 2018-03-19 DIAGNOSIS — G4700 Insomnia, unspecified: Secondary | ICD-10-CM

## 2018-03-19 DIAGNOSIS — Z85828 Personal history of other malignant neoplasm of skin: Secondary | ICD-10-CM

## 2018-03-19 DIAGNOSIS — R4182 Altered mental status, unspecified: Secondary | ICD-10-CM

## 2018-03-19 DIAGNOSIS — W1830XA Fall on same level, unspecified, initial encounter: Secondary | ICD-10-CM | POA: Diagnosis present

## 2018-03-19 LAB — URINALYSIS, ROUTINE W REFLEX MICROSCOPIC
Bacteria, UA: NONE SEEN
Bilirubin Urine: NEGATIVE
Glucose, UA: NEGATIVE mg/dL
Ketones, ur: NEGATIVE mg/dL
Leukocytes, UA: NEGATIVE
Nitrite: NEGATIVE
Protein, ur: NEGATIVE mg/dL
Specific Gravity, Urine: 1.017 (ref 1.005–1.030)
pH: 7 (ref 5.0–8.0)

## 2018-03-19 LAB — CBC WITH DIFFERENTIAL/PLATELET
Basophils Absolute: 0 10*3/uL (ref 0.0–0.1)
Basophils Relative: 0 %
Eosinophils Absolute: 0.1 10*3/uL (ref 0.0–0.7)
Eosinophils Relative: 2 %
HCT: 36.6 % — ABNORMAL LOW (ref 39.0–52.0)
Hemoglobin: 12.5 g/dL — ABNORMAL LOW (ref 13.0–17.0)
Lymphocytes Relative: 24 %
Lymphs Abs: 1.5 10*3/uL (ref 0.7–4.0)
MCH: 31.3 pg (ref 26.0–34.0)
MCHC: 34.2 g/dL (ref 30.0–36.0)
MCV: 91.7 fL (ref 78.0–100.0)
Monocytes Absolute: 1 10*3/uL (ref 0.1–1.0)
Monocytes Relative: 15 %
Neutro Abs: 3.8 10*3/uL (ref 1.7–7.7)
Neutrophils Relative %: 59 %
Platelets: 114 10*3/uL — ABNORMAL LOW (ref 150–400)
RBC: 3.99 MIL/uL — ABNORMAL LOW (ref 4.22–5.81)
RDW: 13.6 % (ref 11.5–15.5)
WBC: 6.4 10*3/uL (ref 4.0–10.5)

## 2018-03-19 LAB — I-STAT TROPONIN, ED: Troponin i, poc: 0.02 ng/mL (ref 0.00–0.08)

## 2018-03-19 LAB — BASIC METABOLIC PANEL
Anion gap: 8 (ref 5–15)
BUN: 16 mg/dL (ref 6–20)
CO2: 27 mmol/L (ref 22–32)
Calcium: 8.3 mg/dL — ABNORMAL LOW (ref 8.9–10.3)
Chloride: 91 mmol/L — ABNORMAL LOW (ref 101–111)
Creatinine, Ser: 0.73 mg/dL (ref 0.61–1.24)
GFR calc Af Amer: >60 mL/min (ref >60)
GFR calc non Af Amer: >60 mL/min (ref >60)
Glucose, Bld: 99 mg/dL (ref 65–99)
Potassium: 4.3 mmol/L (ref 3.5–5.1)
Sodium: 126 mmol/L — ABNORMAL LOW (ref 135–145)

## 2018-03-19 NOTE — ED Provider Notes (Signed)
[facility] EMERGENCY DEPARTMENT Provider Note   CSN: [id number] Arrival date & time: 03/19/18  2105     History   Chief Complaint Chief Complaint  Patient presents with  . Weakness  . Fall    HPI Ryan Savage is a 84 y.o. male.  84 yo M with a significant past medical history of a subdural hematoma comes in with a chief complaint of right lower extremity weakness.  Patient had similar symptoms when he had his prior subdural.  He was recently seen in the office about a week ago with a head CT that was thought to be improving from his prior.  At that time the patient had no continued symptoms.  He has fallen a couple times today.  He denies head injury.  Denies headache.  Denies cough congestion or fever.  Denies vomiting or diarrhea.  Denies dark stool or blood in stool.  The history is provided by the patient.  Weakness  Pertinent negatives include no shortness of breath, no chest pain, no vomiting, no confusion and no headaches.  Fall  Pertinent negatives include no chest pain, no abdominal pain, no headaches and no shortness of breath.  Illness  This is a new problem. The current episode started yesterday. The problem occurs constantly. The problem has not changed since onset.Pertinent negatives include no chest pain, no abdominal pain, no headaches and no shortness of breath. Nothing aggravates the symptoms. Nothing relieves the symptoms. He has tried nothing for the symptoms. The treatment provided no relief.    Past Medical History:  Diagnosis Date  . Atrial fibrillation, chronic (HCC)    Not on anticoagulation due to history of bilateral subdural bleeds due to recurrent falls  . BPH (benign prostatic hypertrophy)   . [name] (coronary artery disease), native coronary artery    Severe two-vessel [name] with chronically occluded LAD and RCA with widely patent left main, intermediate, and left circumflex branches.  On medical management.  He has extensive  collaterals.  . 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 fib with RVR  . [name] (degenerative joint disease)   . GERD (gastroesophageal reflux disease)   . Hyperlipidemia    LDL goal less then 100  . Hypertension   . Microscopic hematuria    [name]  . Mitral valve prolapse    with moderate MR  . Myocardial infarction [facility])    "was told 11/2017 that he'd had a heart attack; don't know when it was" (02/19/2018)  . OSA (obstructive sleep apnea)    intolerant with CPAP (02/19/2018)  . SDH (subdural hematoma) ([place]) 01/2018  . SIADH (syndrome of inappropriate ADH production) ([place])   . Skin cancer    "side of my nose" (11/19/2017)  . Stroke ([place]) 12/2017   "fully recovered"  . Thrombocytopenia (HCC)    Mild- platelet count 143,000 on 02/2011, stable 08/2011    Patient Active Problem List   Diagnosis Date Noted  . 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 Protein-calorie malnutrition, severe 03/20/2018  . Traumatic subdural hematoma ([place]) 02/24/2018  . Acute blood loss anemia   . Steroid-induced hyperglycemia   . 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 Chronic systolic CHF (congestive heart failure), NYHA class 2 ([place]) 02/20/2018  . Hypertension 02/20/2018  . Chronic atrial fibrillation ([place]) 02/20/2018  . Coronary artery disease  02/20/2018  . Mild mitral valve regurgitation 02/20/2018  . Subdural hematoma ([place]) 02/20/2018  . [name] (coronary artery disease), native coronary artery   . SDH (subdural hematoma) ([place]) 01/25/2018  . Bilateral subdural hematomas ([place]) 01/23/2018  . Malnutrition of moderate degree 12/10/2017  . Cerebral embolism with cerebral infarction 12/09/2017  . Odynophagia 12/08/2017  . Syncope and collapse 12/08/2017  . Hyperlipidemia 12/08/2017  . OSA (obstructive sleep apnea) 12/08/2017  .  Dysarthria 12/08/2017  . [name] (coronary artery disease) 12/08/2017  . Chronic systolic heart failure ([place]) 12/08/2017  . Syndrome of inappropriate ADH (SIADH) secretion ([place]) 12/08/2017  . SIADH (syndrome of inappropriate ADH production) ([place])   . Subclinical hyperthyroidism   . Hyponatremia with excess extracellular fluid volume 11/18/2017  . Hyperglycemia 11/18/2017  . Supratherapeutic INR 11/18/2017  . Mitral valve prolapse   . Permanent atrial fibrillation ([place]) 11/08/2013  . Benign essential HTN 11/08/2013  . Mitral regurgitation 11/08/2013    Past Surgical History:  Procedure Laterality Date  . APPENDECTOMY  1950  . BACK SURGERY    . BURR HOLE Bilateral 01/25/2018   Procedure: Haskell Flirt;  Surgeon: Kary Kos, MD;  Location: [facility];  Service: Neurosurgery;  Laterality: Bilateral;  . BURR HOLE Left 02/20/2018   Procedure: BURR HOLES for subdural hematoma;  Surgeon: Newman Pies, MD;  Location: [facility];  Service: Neurosurgery;  Laterality: Left;  . FOREARM FRACTURE SURGERY Right 1954  . LUMBAR DISC SURGERY  1959   ruptured disc repair  . RIGHT/LEFT HEART CATH AND CORONARY ANGIOGRAPHY N/A 11/21/2017   Procedure: RIGHT/LEFT HEART CATH AND CORONARY ANGIOGRAPHY;  Surgeon: Sherren Mocha, MD;  Location: [facility];  Service: Cardiovascular;  Laterality: N/A;  . SKIN CANCER EXCISION Left    "side of my nose"        Home Medications    Prior to Admission medications   Medication Sig Start Date End Date Taking? Authorizing Provider  atorvastatin (LIPITOR) 20 MG tablet Take 1 tablet (20 mg total) by mouth every evening. 03/05/18  Yes Love, Ivan Anchors, PA-C  digoxin (LANOXIN) 0.125 MG tablet Take 1 tablet (0.125 mg total) by mouth daily. 03/05/18  Yes Love, Ivan Anchors, PA-C  finasteride (PROSCAR) 5 MG tablet Take 1 tablet (5 mg total) by mouth daily. 03/05/18  Yes Love, Ivan Anchors, PA-C  fluticasone (FLONASE) 50 MCG/ACT nasal spray Place 1 spray into both nostrils daily.   Yes  [provider]  gabapentin (NEURONTIN) 300 MG capsule Take 1 capsule (300 mg total) by mouth at bedtime. 03/05/18  Yes Love, Ivan Anchors, PA-C  levETIRAcetam (KEPPRA) 500 MG tablet Take 1 tablet (500 mg total) by mouth 2 (two) times daily. 03/05/18  Yes Love, Ivan Anchors, PA-C  metoprolol succinate (TOPROL-XL) 25 MG 24 hr tablet Take 1 tablet (25 mg total) by mouth daily. 03/05/18  Yes Love, Ivan Anchors, PA-C  ranitidine (ZANTAC) 150 MG tablet Take 150 mg by mouth 2 (two) times daily.   Yes [provider]  traZODone (DESYREL) 50 MG tablet Take 0.5-1 tablets (25-50 mg total) by mouth at bedtime as needed for sleep. 03/05/18  Yes Love, Ivan Anchors, PA-C  docusate sodium (COLACE) 100 MG capsule Take 1 capsule (100 mg total) by mouth daily as needed for mild constipation. Patient not taking: Reported on 03/19/2018 03/05/18   Love, Ivan Anchors, PA-C  pantoprazole (PROTONIX) 40 MG tablet Take 1 tablet (40 mg total) by mouth every evening. Patient not taking: Reported on 03/19/2018 03/05/18  Love, Ivan Anchors, PA-C    Family History Family History  Problem Relation Age of Onset  . Heart failure Mother   . Heart attack Father   . [name] Father   . Heart attack Brother   . [name] Brother   . Colon cancer Brother   . Colon cancer Sister     Social History Social History   Tobacco Use  . Smoking status: Former Smoker    Years: 3.00    Types: Cigarettes    Last attempt to quit: 1958    Years since quitting: 61.3  . Smokeless tobacco: Never Used  Substance Use Topics  . Alcohol use: No  . Drug use: No     Allergies   Novocain [procaine]   Review of Systems Review of Systems  Constitutional: Negative for chills and fever.  HENT: Negative for congestion and facial swelling.   Eyes: Negative for discharge and visual disturbance.  Respiratory: Negative for shortness of breath.   Cardiovascular: Negative for chest pain and palpitations.  Gastrointestinal: Negative for abdominal pain, diarrhea and  vomiting.  Musculoskeletal: Negative for arthralgias and myalgias.  Skin: Negative for color change and rash.  Neurological: Positive for weakness. Negative for tremors, syncope and headaches.  Psychiatric/Behavioral: Negative for confusion and dysphoric mood.     Physical Exam Updated Vital Signs BP 94/78   Pulse (!) 58   Temp (!) 96.5 F (35.8 C) (Axillary)   Resp 16   Ht 5\' 10"  (1.778 m)   Wt 72.4 kg (159 lb 9.8 oz)   SpO2 100%   BMI 22.90 kg/m   Physical Exam  Constitutional: He is oriented to person, place, and time. He appears well-developed and well-nourished.  HENT:  Head: Normocephalic and atraumatic.  Eyes: Pupils are equal, round, and reactive to light. EOM are normal.  Neck: Normal range of motion. Neck supple. No JVD present.  Cardiovascular: Normal rate and regular rhythm. Exam reveals no gallop and no friction rub.  No murmur heard. Pulmonary/Chest: No respiratory distress. He has no wheezes.  Abdominal: He exhibits no distension. There is no rebound and no guarding.  Musculoskeletal: Normal range of motion.  Neurological: He is alert and oriented to person, place, and time.  The patient drags his right leg when he walks.  Ataxia with the right upper and right lower extremity.  Neuro exam is otherwise unremarkable  Skin: No rash noted. No pallor.  Psychiatric: He has a normal mood and affect. His behavior is normal.  Nursing note and vitals reviewed.    ED Treatments / Results  Labs (all labs ordered are listed, but only abnormal results are displayed) Labs Reviewed  MRSA PCR SCREENING - Abnormal; Notable for the following components:      Result Value   MRSA by PCR POSITIVE (*)    All other components within normal limits  CBC WITH DIFFERENTIAL/PLATELET - Abnormal; Notable for the following components:   RBC 3.99 (*)    Hemoglobin 12.5 (*)    HCT 36.6 (*)    Platelets 114 (*)    All other components within normal limits  BASIC METABOLIC PANEL -  Abnormal; Notable for the following components:   Sodium 126 (*)    Chloride 91 (*)    Calcium 8.3 (*)    All other components within normal limits  URINALYSIS, ROUTINE W REFLEX MICROSCOPIC - Abnormal; Notable for the following components:   APPearance CLOUDY (*)    Hgb urine dipstick SMALL (*)  All other components within normal limits  CBC - Abnormal; Notable for the following components:   RBC 3.99 (*)    Hemoglobin 12.5 (*)    HCT 36.9 (*)    Platelets 123 (*)    All other components within normal limits  BASIC METABOLIC PANEL - Abnormal; Notable for the following components:   Sodium 128 (*)    Chloride 94 (*)    Glucose, Bld 199 (*)    Calcium 8.0 (*)    All other components within normal limits  I-STAT TROPONIN, ED    EKG None  Radiology Dg Skull 1-3 Views  Result Date: 03/20/2018 CLINICAL DATA:  Intraoperative skull radiographs for foreign body, retained needle. EXAM: SKULL - 1-3 VIEW COMPARISON:  None. FINDINGS: Left parietal craniectomy changes with overlying skin staples. No unaccountable radiopaque foreign body is noted. Specifically no needle-like foreign body is identified. Endotracheal and nasogastric tubes are partially included. IMPRESSION: Left parietal craniectomy change. No retained needle-like foreign body. These results were called by telephone at the time of interpretation on 03/20/2018 at 4:00 am to [place], who verbally acknowledged these results for Dr. Ellene Route. Electronically Signed   By: Ashley Royalty M.D.   On: 03/20/2018 04:01   Dg Chest 2 View  Result Date: 03/19/2018 CLINICAL DATA:  Patient found under kitchen table with weakness over the past few days worsening over the past 24 hours. EXAM: CHEST - 2 VIEW COMPARISON:  11/18/2017 FINDINGS: Stable cardiomegaly with aortic atherosclerosis. No alveolar consolidation, overt pulmonary edema, effusion or pneumothorax. Mild chronic interstitial prominence noted that may reflect age related interstitial lung  disease. Degenerative changes are present along the dorsal spine. IMPRESSION: Cardiomegaly with aortic atherosclerosis. No active pulmonary disease. Mild chronic interstitial prominence. Electronically Signed   By: Ashley Royalty M.D.   On: 03/19/2018 22:20   Ct Head Wo Contrast  Result Date: 03/19/2018 CLINICAL DATA:  Subdural hematoma EXAM: CT HEAD WITHOUT CONTRAST TECHNIQUE: Contiguous axial images were obtained from the base of the skull through the vertex without intravenous contrast. COMPARISON:  None. FINDINGS: Brain: Left convexity subdural hematoma has increased in size, now measuring approximately 17 mm, previously 12 mm. There is disproportionate worsening of the mass effect on the left side of the brain. At the level of the foramina of [place], there is now 6 mm of rightward midline shift where previously there was none. There is early subfalcine herniation of the cingulate gyrus anteriorly. Basal cisterns remain patent. There is mass effect on the left lateral ventricle without ventricular entrapment or hydrocephalus. Vascular: No hyperdense vessel or unexpected vascular calcification. Skull: Bilateral frontal and parietal burr holes. Sinuses/Orbits: No sinus fluid levels or advanced mucosal thickening. No mastoid effusion. Normal orbits. IMPRESSION: Increased size of left convexity subdural hematoma, with worsening mass effect and early rightward subfalcine herniation. Critical Value/emergent results were called by telephone at the time of interpretation on 03/19/2018 at 10:53 pm to Dr. Deno Etienne , who verbally acknowledged these results. Electronically Signed   By: Ulyses Jarred M.D.   On: 03/19/2018 22:54    Procedures Procedures (including critical care time)  Medications Ordered in ED Medications  atorvastatin (LIPITOR) tablet 20 mg (has no administration in time range)  digoxin (LANOXIN) tablet 0.125 mg (0.125 mg Oral Given 03/20/18 1144)  finasteride (PROSCAR) tablet 5 mg (5 mg Oral Given  03/20/18 1143)  fluticasone (FLONASE) 50 MCG/ACT nasal spray 1 spray (1 spray Each Nare Given 03/20/18 1143)  gabapentin (NEURONTIN) capsule 300 mg (has no  administration in time range)  metoprolol succinate (TOPROL-XL) 24 hr tablet 25 mg (25 mg Oral Given 03/20/18 0955)  traZODone (DESYREL) tablet 25-50 mg (has no administration in time range)  docusate sodium (COLACE) capsule 100 mg (100 mg Oral Given 03/20/18 0955)  senna (SENOKOT) tablet 8.6 mg (8.6 mg Oral Given 03/20/18 0955)  polyethylene glycol (MIRALAX / GLYCOLAX) packet 17 g (has no administration in time range)  bisacodyl (DULCOLAX) suppository 10 mg (has no administration in time range)  sodium phosphate (FLEET) 7-19 GM/118ML enema 1 enema (has no administration in time range)  ondansetron (ZOFRAN) tablet 4 mg (has no administration in time range)    Or  ondansetron (ZOFRAN) injection 4 mg (has no administration in time range)  promethazine (PHENERGAN) tablet 12.5-25 mg (has no administration in time range)  labetalol (NORMODYNE,TRANDATE) injection 10-40 mg (has no administration in time range)  levETIRAcetam (KEPPRA) IVPB 500 mg/100 mL premix (0 mg Intravenous Stopped 03/20/18 0615)  famotidine (PEPCID) IVPB 20 mg premix (0 mg Intravenous Stopped 03/20/18 1026)  lactated ringers infusion ( Intravenous New Bag/Given 03/20/18 0600)  morphine 4 MG/ML injection 2 mg (has no administration in time range)  HYDROcodone-acetaminophen (NORCO/VICODIN) 5-325 MG per tablet 1 tablet (has no administration in time range)  mupirocin ointment (BACTROBAN) 2 % 1 application (1 application Nasal Given 03/20/18 1144)  Chlorhexidine Gluconate Cloth 2 % PADS 6 each (has no administration in time range)  feeding supplement (ENSURE ENLIVE) (ENSURE ENLIVE) liquid 237 mL (has no administration in time range)  multivitamin liquid 15 mL (has no administration in time range)     Initial Impression / Assessment and Plan / ED Course  I have reviewed the triage  vital signs and the nursing notes.  Pertinent labs & imaging results that were available during my care of the patient were reviewed by me and considered in my medical decision making (see chart for details).     84 yo M with a chief complaint of multiple falls right lower extremity weakness.  This been going on since yesterday.  Patient was recently found to have recurrent subdural hematomas.  He has had bur holes and drainage previously.  Repeat CT here with worsening subdural.  I discussed case with Dr. Ellene Route.  CRITICAL CARE Performed by: Cecilio Asper   Total critical care time: 35 minutes  Critical care time was exclusive of separately billable procedures and treating other patients.  Critical care was necessary to treat or prevent imminent or life-threatening deterioration.  Critical care was time spent personally by me on the following activities: development of treatment plan with patient and/or surrogate as well as nursing, discussions with consultants, evaluation of patient's response to treatment, examination of patient, obtaining history from patient or surrogate, ordering and performing treatments and interventions, ordering and review of laboratory studies, ordering and review of radiographic studies, pulse oximetry and re-evaluation of patient's condition.   Final Clinical Impressions(s) / ED Diagnoses   Final diagnoses:  Subdural hematoma without coma, without loss of consciousness, subsequent encounter  Hyponatremia  Normochromic normocytic anemia  Thrombocytopenia [facility])    ED Discharge Orders    None       Deno Etienne, DO 03/20/18 1534

## 2018-03-19 NOTE — ED Triage Notes (Signed)
Pt arrived via [facility], per EMS pt from home found under kitchen with c/o wkness past few days worsening in last 24hrs and cant stand and walk. Pot has hx of subdural hematoma, CVAs, A/Ox4 and has fallen twice in last 24hrs. Hx of Afib, CHF, and heart blocks; 132/80, 90, 98% on RA, no CBG. Pt taken off blood thinners "coiuple of months ago."

## 2018-03-20 ENCOUNTER — Encounter (HOSPITAL_COMMUNITY): Payer: Self-pay | Admitting: Neurological Surgery

## 2018-03-20 ENCOUNTER — Inpatient Hospital Stay (HOSPITAL_COMMUNITY): Payer: Medicare Other

## 2018-03-20 ENCOUNTER — Inpatient Hospital Stay (HOSPITAL_COMMUNITY): Payer: Medicare Other | Admitting: Anesthesiology

## 2018-03-20 ENCOUNTER — Encounter (HOSPITAL_COMMUNITY)
Admission: EM | Disposition: A | Discharge status: Rehabilitation Facility | Payer: Self-pay | Source: Home / Self Care | Attending: Neurological Surgery

## 2018-03-20 ENCOUNTER — Other Ambulatory Visit: Payer: Self-pay

## 2018-03-20 DIAGNOSIS — D696 Thrombocytopenia, unspecified: Secondary | ICD-10-CM | POA: Diagnosis present

## 2018-03-20 DIAGNOSIS — I482 Chronic atrial fibrillation: Secondary | ICD-10-CM | POA: Diagnosis not present

## 2018-03-20 DIAGNOSIS — S065X0D Traumatic subdural hemorrhage without loss of consciousness, subsequent encounter: Secondary | ICD-10-CM | POA: Diagnosis not present

## 2018-03-20 DIAGNOSIS — R0682 Tachypnea, not elsewhere classified: Secondary | ICD-10-CM | POA: Diagnosis not present

## 2018-03-20 DIAGNOSIS — E43 Unspecified severe protein-calorie malnutrition: Secondary | ICD-10-CM

## 2018-03-20 DIAGNOSIS — I2510 Atherosclerotic heart disease of native coronary artery without angina pectoris: Secondary | ICD-10-CM | POA: Diagnosis present

## 2018-03-20 DIAGNOSIS — G4701 Insomnia due to medical condition: Secondary | ICD-10-CM | POA: Diagnosis not present

## 2018-03-20 DIAGNOSIS — E785 Hyperlipidemia, unspecified: Secondary | ICD-10-CM | POA: Diagnosis present

## 2018-03-20 DIAGNOSIS — D62 Acute posthemorrhagic anemia: Secondary | ICD-10-CM | POA: Diagnosis not present

## 2018-03-20 DIAGNOSIS — I252 Old myocardial infarction: Secondary | ICD-10-CM | POA: Diagnosis not present

## 2018-03-20 DIAGNOSIS — W1830XA Fall on same level, unspecified, initial encounter: Secondary | ICD-10-CM | POA: Diagnosis present

## 2018-03-20 DIAGNOSIS — R739 Hyperglycemia, unspecified: Secondary | ICD-10-CM | POA: Diagnosis not present

## 2018-03-20 DIAGNOSIS — K219 Gastro-esophageal reflux disease without esophagitis: Secondary | ICD-10-CM | POA: Diagnosis present

## 2018-03-20 DIAGNOSIS — Z8673 Personal history of transient ischemic attack (TIA), and cerebral infarction without residual deficits: Secondary | ICD-10-CM | POA: Diagnosis not present

## 2018-03-20 DIAGNOSIS — Z85828 Personal history of other malignant neoplasm of skin: Secondary | ICD-10-CM | POA: Diagnosis not present

## 2018-03-20 DIAGNOSIS — Z8249 Family history of ischemic heart disease and other diseases of the circulatory system: Secondary | ICD-10-CM | POA: Diagnosis not present

## 2018-03-20 DIAGNOSIS — G4700 Insomnia, unspecified: Secondary | ICD-10-CM | POA: Diagnosis present

## 2018-03-20 DIAGNOSIS — S065X9A Traumatic subdural hemorrhage with loss of consciousness of unspecified duration, initial encounter: Secondary | ICD-10-CM | POA: Diagnosis not present

## 2018-03-20 DIAGNOSIS — G8191 Hemiplegia, unspecified affecting right dominant side: Secondary | ICD-10-CM | POA: Diagnosis not present

## 2018-03-20 DIAGNOSIS — G4733 Obstructive sleep apnea (adult) (pediatric): Secondary | ICD-10-CM | POA: Diagnosis present

## 2018-03-20 DIAGNOSIS — Z87891 Personal history of nicotine dependence: Secondary | ICD-10-CM | POA: Diagnosis not present

## 2018-03-20 DIAGNOSIS — Z6822 Body mass index (BMI) 22.0-22.9, adult: Secondary | ICD-10-CM | POA: Diagnosis not present

## 2018-03-20 DIAGNOSIS — N400 Enlarged prostate without lower urinary tract symptoms: Secondary | ICD-10-CM | POA: Diagnosis present

## 2018-03-20 DIAGNOSIS — I5022 Chronic systolic (congestive) heart failure: Secondary | ICD-10-CM | POA: Diagnosis present

## 2018-03-20 DIAGNOSIS — I480 Paroxysmal atrial fibrillation: Secondary | ICD-10-CM | POA: Diagnosis present

## 2018-03-20 DIAGNOSIS — I6200 Nontraumatic subdural hemorrhage, unspecified: Secondary | ICD-10-CM | POA: Diagnosis not present

## 2018-03-20 DIAGNOSIS — Z9889 Other specified postprocedural states: Secondary | ICD-10-CM | POA: Diagnosis not present

## 2018-03-20 DIAGNOSIS — E222 Syndrome of inappropriate secretion of antidiuretic hormone: Secondary | ICD-10-CM | POA: Diagnosis not present

## 2018-03-20 DIAGNOSIS — I10 Essential (primary) hypertension: Secondary | ICD-10-CM | POA: Diagnosis not present

## 2018-03-20 DIAGNOSIS — S069X3S Unspecified intracranial injury with loss of consciousness of 1 hour to 5 hours 59 minutes, sequela: Secondary | ICD-10-CM | POA: Diagnosis not present

## 2018-03-20 DIAGNOSIS — I6201 Nontraumatic acute subdural hemorrhage: Secondary | ICD-10-CM | POA: Diagnosis present

## 2018-03-20 DIAGNOSIS — S065X0A Traumatic subdural hemorrhage without loss of consciousness, initial encounter: Secondary | ICD-10-CM | POA: Diagnosis present

## 2018-03-20 DIAGNOSIS — T380X5A Adverse effect of glucocorticoids and synthetic analogues, initial encounter: Secondary | ICD-10-CM | POA: Diagnosis not present

## 2018-03-20 DIAGNOSIS — Z8679 Personal history of other diseases of the circulatory system: Secondary | ICD-10-CM | POA: Diagnosis not present

## 2018-03-20 DIAGNOSIS — Z298 Encounter for other specified prophylactic measures: Secondary | ICD-10-CM | POA: Diagnosis not present

## 2018-03-20 DIAGNOSIS — R296 Repeated falls: Secondary | ICD-10-CM | POA: Diagnosis present

## 2018-03-20 DIAGNOSIS — R531 Weakness: Secondary | ICD-10-CM | POA: Diagnosis present

## 2018-03-20 DIAGNOSIS — I110 Hypertensive heart disease with heart failure: Secondary | ICD-10-CM | POA: Diagnosis present

## 2018-03-20 DIAGNOSIS — I6203 Nontraumatic chronic subdural hemorrhage: Secondary | ICD-10-CM

## 2018-03-20 DIAGNOSIS — E871 Hypo-osmolality and hyponatremia: Secondary | ICD-10-CM | POA: Diagnosis present

## 2018-03-20 HISTORY — PX: BURR HOLE: SHX908

## 2018-03-20 LAB — CBC
HCT: 36.9 % — ABNORMAL LOW (ref 39.0–52.0)
Hemoglobin: 12.5 g/dL — ABNORMAL LOW (ref 13.0–17.0)
MCH: 31.3 pg (ref 26.0–34.0)
MCHC: 33.9 g/dL (ref 30.0–36.0)
MCV: 92.5 fL (ref 78.0–100.0)
Platelets: 123 10*3/uL — ABNORMAL LOW (ref 150–400)
RBC: 3.99 MIL/uL — ABNORMAL LOW (ref 4.22–5.81)
RDW: 13.6 % (ref 11.5–15.5)
WBC: 5 10*3/uL (ref 4.0–10.5)

## 2018-03-20 LAB — BASIC METABOLIC PANEL
Anion gap: 9 (ref 5–15)
BUN: 12 mg/dL (ref 6–20)
CO2: 25 mmol/L (ref 22–32)
Calcium: 8 mg/dL — ABNORMAL LOW (ref 8.9–10.3)
Chloride: 94 mmol/L — ABNORMAL LOW (ref 101–111)
Creatinine, Ser: 0.74 mg/dL (ref 0.61–1.24)
GFR calc Af Amer: >60 mL/min (ref >60)
GFR calc non Af Amer: >60 mL/min (ref >60)
Glucose, Bld: 199 mg/dL — ABNORMAL HIGH (ref 65–99)
Potassium: 4.3 mmol/L (ref 3.5–5.1)
Sodium: 128 mmol/L — ABNORMAL LOW (ref 135–145)

## 2018-03-20 LAB — MRSA PCR SCREENING: MRSA by PCR: POSITIVE — AB

## 2018-03-20 SURGERY — CREATION, CRANIAL BURR HOLE
Anesthesia: General | Site: Head | Laterality: Left

## 2018-03-20 MED ORDER — PROPOFOL 10 MG/ML IV BOLUS
INTRAVENOUS | Status: DC | PRN
  Administered 2018-03-20: 50 mg via INTRAVENOUS

## 2018-03-20 MED ORDER — ROCURONIUM BROMIDE 50 MG/5ML IV SOLN
INTRAVENOUS | Status: AC
  Filled 2018-03-20: qty 1

## 2018-03-20 MED ORDER — FENTANYL CITRATE (PF) 250 MCG/5ML IJ SOLN
INTRAMUSCULAR | Status: AC
  Filled 2018-03-20: qty 5

## 2018-03-20 MED ORDER — FAMOTIDINE 20 MG PO TABS: 20.0000 mg | ORAL_TABLET | Freq: Two times a day (BID) | ORAL | Status: DC

## 2018-03-20 MED ORDER — ENSURE ENLIVE PO LIQD
237.0000 mL | Freq: Three times a day (TID) | ORAL | Status: DC
  Administered 2018-03-20 – 2018-03-25 (×15): 237 mL via ORAL

## 2018-03-20 MED ORDER — ONDANSETRON HCL 4 MG/2ML IJ SOLN: 4.0000 mg | INTRAMUSCULAR | Status: DC | PRN

## 2018-03-20 MED ORDER — ADULT MULTIVITAMIN LIQUID CH
15.0000 mL | Freq: Every day | ORAL | Status: DC
  Administered 2018-03-20 – 2018-03-23 (×4): 15 mL
  Filled 2018-03-20 (×5): qty 15

## 2018-03-20 MED ORDER — 0.9 % SODIUM CHLORIDE (POUR BTL) OPTIME
TOPICAL | Status: DC | PRN
  Administered 2018-03-20 (×2): 1000 mL

## 2018-03-20 MED ORDER — CHLORHEXIDINE GLUCONATE CLOTH 2 % EX PADS: 6.0000 | MEDICATED_PAD | Freq: Every day | CUTANEOUS | Status: DC

## 2018-03-20 MED ORDER — BISACODYL 10 MG RE SUPP: 10.0000 mg | Freq: Every day | RECTAL | Status: DC | PRN

## 2018-03-20 MED ORDER — DIGOXIN 125 MCG PO TABS
0.1250 mg | ORAL_TABLET | Freq: Every day | ORAL | Status: DC
  Administered 2018-03-20 – 2018-03-25 (×6): 0.125 mg via ORAL
  Filled 2018-03-20 (×6): qty 1

## 2018-03-20 MED ORDER — BACITRACIN ZINC 500 UNIT/GM EX OINT
TOPICAL_OINTMENT | CUTANEOUS | Status: AC
  Filled 2018-03-20: qty 28.35

## 2018-03-20 MED ORDER — DEXAMETHASONE SODIUM PHOSPHATE 10 MG/ML IJ SOLN
INTRAMUSCULAR | Status: AC
Start: 2018-03-20 — End: ?
  Filled 2018-03-20: qty 1

## 2018-03-20 MED ORDER — PROPOFOL 10 MG/ML IV BOLUS
INTRAVENOUS | Status: AC
  Filled 2018-03-20: qty 20

## 2018-03-20 MED ORDER — ONDANSETRON HCL 4 MG/2ML IJ SOLN
INTRAMUSCULAR | Status: AC
Start: 2018-03-20 — End: ?
  Filled 2018-03-20: qty 2

## 2018-03-20 MED ORDER — SUGAMMADEX SODIUM 200 MG/2ML IV SOLN
INTRAVENOUS | Status: AC
  Filled 2018-03-20: qty 2

## 2018-03-20 MED ORDER — ATORVASTATIN CALCIUM 20 MG PO TABS
20.0000 mg | ORAL_TABLET | Freq: Every evening | ORAL | Status: DC
  Administered 2018-03-20 – 2018-03-24 (×5): 20 mg via ORAL
  Filled 2018-03-20 (×5): qty 1

## 2018-03-20 MED ORDER — SUCCINYLCHOLINE 20MG/ML (10ML) SYRINGE FOR MEDFUSION PUMP - OPTIME
INTRAMUSCULAR | Status: DC | PRN
  Administered 2018-03-20: 100 mg via INTRAVENOUS

## 2018-03-20 MED ORDER — TRAZODONE 25 MG HALF TABLET
25.0000 mg | ORAL_TABLET | Freq: Every evening | ORAL | Status: DC | PRN
  Administered 2018-03-23: 50 mg via ORAL
  Administered 2018-03-23 – 2018-03-24 (×2): 25 mg via ORAL
  Filled 2018-03-20 (×4): qty 2

## 2018-03-20 MED ORDER — LACTATED RINGERS IV SOLN
INTRAVENOUS | Status: DC
  Administered 2018-03-20 – 2018-03-25 (×5): via INTRAVENOUS

## 2018-03-20 MED ORDER — ONDANSETRON HCL 4 MG/2ML IJ SOLN
INTRAMUSCULAR | Status: DC | PRN
  Administered 2018-03-20: 4 mg via INTRAVENOUS

## 2018-03-20 MED ORDER — MORPHINE SULFATE (PF) 4 MG/ML IV SOLN: 2.0000 mg | INTRAVENOUS | Status: DC | PRN

## 2018-03-20 MED ORDER — POLYETHYLENE GLYCOL 3350 17 G PO PACK: 17.0000 g | PACK | Freq: Every day | ORAL | Status: DC | PRN

## 2018-03-20 MED ORDER — THROMBIN (RECOMBINANT) 20000 UNITS EX SOLR
CUTANEOUS | Status: DC | PRN
  Administered 2018-03-20: 20 mL via TOPICAL

## 2018-03-20 MED ORDER — DOCUSATE SODIUM 100 MG PO CAPS
100.0000 mg | ORAL_CAPSULE | Freq: Two times a day (BID) | ORAL | Status: DC
  Administered 2018-03-20 – 2018-03-25 (×9): 100 mg via ORAL
  Filled 2018-03-20 (×9): qty 1

## 2018-03-20 MED ORDER — FLUTICASONE PROPIONATE 50 MCG/ACT NA SUSP
1.0000 | Freq: Every day | NASAL | Status: DC
  Administered 2018-03-20 – 2018-03-25 (×6): 1 via NASAL
  Filled 2018-03-20: qty 16

## 2018-03-20 MED ORDER — BACITRACIN ZINC 500 UNIT/GM EX OINT
TOPICAL_OINTMENT | CUTANEOUS | Status: DC | PRN
  Administered 2018-03-20: 1 via TOPICAL

## 2018-03-20 MED ORDER — THROMBIN (RECOMBINANT) 5000 UNITS EX SOLR
OROMUCOSAL | Status: DC | PRN
  Administered 2018-03-20: 5 mL via TOPICAL

## 2018-03-20 MED ORDER — MUPIROCIN 2 % EX OINT
1.0000 "application " | TOPICAL_OINTMENT | Freq: Two times a day (BID) | CUTANEOUS | Status: AC
  Administered 2018-03-20 – 2018-03-24 (×10): 1 via NASAL
  Filled 2018-03-20 (×4): qty 22

## 2018-03-20 MED ORDER — LIDOCAINE 2% (20 MG/ML) 5 ML SYRINGE
INTRAMUSCULAR | Status: AC
  Filled 2018-03-20: qty 5

## 2018-03-20 MED ORDER — ROCURONIUM BROMIDE 100 MG/10ML IV SOLN
INTRAVENOUS | Status: DC | PRN
  Administered 2018-03-20: 30 mg via INTRAVENOUS

## 2018-03-20 MED ORDER — SODIUM CHLORIDE 0.9 % IV SOLN
INTRAVENOUS | Status: DC | PRN
  Administered 2018-03-20: 02:00:00 via INTRAVENOUS

## 2018-03-20 MED ORDER — FINASTERIDE 5 MG PO TABS
5.0000 mg | ORAL_TABLET | Freq: Every day | ORAL | Status: DC
  Administered 2018-03-20 – 2018-03-25 (×7): 5 mg via ORAL
  Filled 2018-03-20 (×6): qty 1

## 2018-03-20 MED ORDER — FENTANYL CITRATE (PF) 250 MCG/5ML IJ SOLN
INTRAMUSCULAR | Status: DC | PRN
  Administered 2018-03-20: 100 ug via INTRAVENOUS

## 2018-03-20 MED ORDER — GABAPENTIN 300 MG PO CAPS
300.0000 mg | ORAL_CAPSULE | Freq: Every day | ORAL | Status: DC
  Administered 2018-03-20 – 2018-03-24 (×5): 300 mg via ORAL
  Filled 2018-03-20 (×5): qty 1

## 2018-03-20 MED ORDER — LIDOCAINE HCL (CARDIAC) PF 100 MG/5ML IV SOSY
PREFILLED_SYRINGE | INTRAVENOUS | Status: DC | PRN
  Administered 2018-03-20: 100 mg via INTRATRACHEAL

## 2018-03-20 MED ORDER — SUCCINYLCHOLINE CHLORIDE 200 MG/10ML IV SOSY
PREFILLED_SYRINGE | INTRAVENOUS | Status: AC
  Filled 2018-03-20: qty 10

## 2018-03-20 MED ORDER — SODIUM CHLORIDE 0.9 % IV SOLN
INTRAVENOUS | Status: DC | PRN
  Administered 2018-03-20: 500 mL

## 2018-03-20 MED ORDER — DEXAMETHASONE SODIUM PHOSPHATE 10 MG/ML IJ SOLN
INTRAMUSCULAR | Status: DC | PRN
  Administered 2018-03-20: 10 mg via INTRAVENOUS

## 2018-03-20 MED ORDER — ONDANSETRON HCL 4 MG PO TABS: 4.0000 mg | ORAL_TABLET | ORAL | Status: DC | PRN

## 2018-03-20 MED ORDER — FAMOTIDINE IN NACL 20-0.9 MG/50ML-% IV SOLN
20.0000 mg | Freq: Two times a day (BID) | INTRAVENOUS | Status: DC
  Administered 2018-03-20 – 2018-03-21 (×3): 20 mg via INTRAVENOUS
  Filled 2018-03-20 (×3): qty 50

## 2018-03-20 MED ORDER — METOPROLOL SUCCINATE ER 25 MG PO TB24
25.0000 mg | ORAL_TABLET | Freq: Every day | ORAL | Status: DC
  Administered 2018-03-20 – 2018-03-25 (×5): 25 mg via ORAL
  Filled 2018-03-20 (×6): qty 1

## 2018-03-20 MED ORDER — THROMBIN 5000 UNITS EX SOLR
CUTANEOUS | Status: AC
  Filled 2018-03-20: qty 5000

## 2018-03-20 MED ORDER — LEVETIRACETAM IN NACL 500 MG/100ML IV SOLN
500.0000 mg | Freq: Two times a day (BID) | INTRAVENOUS | Status: DC
  Administered 2018-03-20 – 2018-03-25 (×11): 500 mg via INTRAVENOUS
  Filled 2018-03-20 (×12): qty 100

## 2018-03-20 MED ORDER — SUGAMMADEX SODIUM 200 MG/2ML IV SOLN
INTRAVENOUS | Status: DC | PRN
  Administered 2018-03-20: 200 mg via INTRAVENOUS

## 2018-03-20 MED ORDER — EPHEDRINE SULFATE 50 MG/ML IJ SOLN
INTRAMUSCULAR | Status: AC
  Filled 2018-03-20: qty 1

## 2018-03-20 MED ORDER — CEFAZOLIN SODIUM-DEXTROSE 2-3 GM-%(50ML) IV SOLR
INTRAVENOUS | Status: DC | PRN
  Administered 2018-03-20: 2 g via INTRAVENOUS

## 2018-03-20 MED ORDER — LEVETIRACETAM 500 MG PO TABS: 500.0000 mg | ORAL_TABLET | Freq: Two times a day (BID) | ORAL | Status: DC

## 2018-03-20 MED ORDER — FLEET ENEMA 7-19 GM/118ML RE ENEM: 1.0000 | ENEMA | Freq: Once | RECTAL | Status: DC | PRN

## 2018-03-20 MED ORDER — THROMBIN 20000 UNITS EX SOLR
CUTANEOUS | Status: AC
  Filled 2018-03-20: qty 20000

## 2018-03-20 MED ORDER — SENNA 8.6 MG PO TABS
1.0000 | ORAL_TABLET | Freq: Two times a day (BID) | ORAL | Status: DC
  Administered 2018-03-20 – 2018-03-25 (×8): 8.6 mg via ORAL
  Filled 2018-03-20 (×8): qty 1

## 2018-03-20 MED ORDER — PROMETHAZINE HCL 25 MG PO TABS: 12.5000 mg | ORAL_TABLET | ORAL | Status: DC | PRN

## 2018-03-20 MED ORDER — PHENYLEPHRINE HCL 10 MG/ML IJ SOLN
INTRAMUSCULAR | Status: DC | PRN
  Administered 2018-03-20: 50 ug/min via INTRAVENOUS

## 2018-03-20 MED ORDER — LABETALOL HCL 5 MG/ML IV SOLN: 10.0000 mg | INTRAVENOUS | Status: DC | PRN

## 2018-03-20 MED ORDER — HYDROCODONE-ACETAMINOPHEN 5-325 MG PO TABS
1.0000 | ORAL_TABLET | ORAL | Status: DC | PRN
  Administered 2018-03-21: 1 via ORAL
  Filled 2018-03-20: qty 1

## 2018-03-20 SURGICAL SUPPLY — 66 items
BAG DECANTER FOR FLEXI CONT (MISCELLANEOUS) ×2 IMPLANT
BNDG GAUZE ELAST 4 BULKY (GAUZE/BANDAGES/DRESSINGS) ×2 IMPLANT
BUR ACORN 6.0 (BURR) ×2 IMPLANT
BUR SPIRAL ROUTER 2.3 (BUR) IMPLANT
CABLE BIPOLOR RESECTION CORD (MISCELLANEOUS) ×2 IMPLANT
CANISTER SUCT 3000ML PPV (MISCELLANEOUS) ×2 IMPLANT
CARTRIDGE OIL MAESTRO DRILL (MISCELLANEOUS) ×1 IMPLANT
CLIP VESOCCLUDE MED 6/CT (CLIP) IMPLANT
DECANTER SPIKE VIAL GLASS SM (MISCELLANEOUS) ×2 IMPLANT
DIFFUSER DRILL AIR PNEUMATIC (MISCELLANEOUS) ×2 IMPLANT
DRAIN CHANNEL 10M FLAT 3/4 FLT (DRAIN) IMPLANT
DRAIN PENROSE 1/2X12 LTX STRL (WOUND CARE) IMPLANT
DRAPE WARM FLUID 44X44 (DRAPE) ×2 IMPLANT
DRSG ADAPTIC 3X8 NADH LF (GAUZE/BANDAGES/DRESSINGS) IMPLANT
DRSG OPSITE POSTOP 4X8 (GAUZE/BANDAGES/DRESSINGS) ×2 IMPLANT
DRSG PAD ABDOMINAL 8X10 ST (GAUZE/BANDAGES/DRESSINGS) IMPLANT
DURAPREP 6ML APPLICATOR 50/CS (WOUND CARE) ×2 IMPLANT
ELECT CAUTERY BLADE 6.4 (BLADE) ×2 IMPLANT
ELECT REM PT RETURN 9FT ADLT (ELECTROSURGICAL) ×2
ELECTRODE REM PT RTRN 9FT ADLT (ELECTROSURGICAL) ×1 IMPLANT
EVACUATOR SILICONE 100CC (DRAIN) IMPLANT
GAUZE SPONGE 4X4 12PLY STRL (GAUZE/BANDAGES/DRESSINGS) IMPLANT
GAUZE SPONGE 4X4 16PLY XRAY LF (GAUZE/BANDAGES/DRESSINGS) IMPLANT
GLOVE BIO SURGEON STRL SZ7 (GLOVE) ×2 IMPLANT
GLOVE BIOGEL PI IND STRL 7.5 (GLOVE) ×1 IMPLANT
GLOVE BIOGEL PI IND STRL 8.5 (GLOVE) ×1 IMPLANT
GLOVE BIOGEL PI INDICATOR 7.5 (GLOVE) ×1
GLOVE BIOGEL PI INDICATOR 8.5 (GLOVE) ×1
GLOVE ECLIPSE 8.5 STRL (GLOVE) ×2 IMPLANT
GLOVE EXAM NITRILE LRG STRL (GLOVE) IMPLANT
GLOVE EXAM NITRILE XL STR (GLOVE) IMPLANT
GLOVE EXAM NITRILE XS STR PU (GLOVE) IMPLANT
GOWN STRL REUS W/ TWL LRG LVL3 (GOWN DISPOSABLE) ×1 IMPLANT
GOWN STRL REUS W/ TWL XL LVL3 (GOWN DISPOSABLE) ×1 IMPLANT
GOWN STRL REUS W/TWL 2XL LVL3 (GOWN DISPOSABLE) IMPLANT
GOWN STRL REUS W/TWL LRG LVL3 (GOWN DISPOSABLE) ×2
GOWN STRL REUS W/TWL XL LVL3 (GOWN DISPOSABLE) ×2
HEMOSTAT SURGICEL 2X14 (HEMOSTASIS) IMPLANT
HOOK DURA (MISCELLANEOUS) ×2 IMPLANT
KIT BASIN OR (CUSTOM PROCEDURE TRAY) ×2 IMPLANT
KIT TURNOVER KIT B (KITS) ×2 IMPLANT
NEEDLE HYPO 22GX1.5 SAFETY (NEEDLE) ×2 IMPLANT
NS IRRIG 1000ML POUR BTL (IV SOLUTION) ×2 IMPLANT
OIL CARTRIDGE MAESTRO DRILL (MISCELLANEOUS) ×2
PACK CRANIOTOMY CUSTOM (CUSTOM PROCEDURE TRAY) ×2 IMPLANT
PATTIES SURGICAL .5 X.5 (GAUZE/BANDAGES/DRESSINGS) IMPLANT
PATTIES SURGICAL .5 X3 (DISPOSABLE) IMPLANT
PATTIES SURGICAL 1X1 (DISPOSABLE) IMPLANT
PIN MAYFIELD SKULL DISP (PIN) IMPLANT
PLATE 1.5  2HOLE MED NEURO (Plate) ×3 IMPLANT
PLATE 1.5 2HOLE MED NEURO (Plate) ×3 IMPLANT
SCREW SELF DRILL HT 1.5/4MM (Screw) ×12 IMPLANT
SPECIMEN JAR SMALL (MISCELLANEOUS) IMPLANT
SPONGE NEURO XRAY DETECT 1X3 (DISPOSABLE) IMPLANT
SPONGE SURGIFOAM ABS GEL 100 (HEMOSTASIS) IMPLANT
STAPLER SKIN PROX WIDE 3.9 (STAPLE) ×2 IMPLANT
SUT ETHILON 3 0 FSL (SUTURE) IMPLANT
SUT NURALON 4 0 TR CR/8 (SUTURE) ×4 IMPLANT
SUT VIC AB 2-0 CP2 18 (SUTURE) ×4 IMPLANT
SYR CONTROL 10ML LL (SYRINGE) ×2 IMPLANT
TAPE CLOTH 1X10 TAN NS (GAUZE/BANDAGES/DRESSINGS) ×2 IMPLANT
TOWEL GREEN STERILE (TOWEL DISPOSABLE) ×2 IMPLANT
TOWEL GREEN STERILE FF (TOWEL DISPOSABLE) ×2 IMPLANT
TRAY FOLEY MTR SLVR 16FR STAT (SET/KITS/TRAYS/PACK) IMPLANT
UNDERPAD 30X30 (UNDERPADS AND DIAPERS) IMPLANT
WATER STERILE IRR 1000ML POUR (IV SOLUTION) ×2 IMPLANT

## 2018-03-20 NOTE — Anesthesia Preprocedure Evaluation (Addendum)
Anesthesia Evaluation  Patient identified by MRN, date of birth, ID band Patient awake    Reviewed: Allergy & Precautions, H&P , NPO status , Patient's Chart, lab work & pertinent test results, reviewed documented beta blocker date and time   Airway Mallampati: III  TM Distance: >3 FB Neck ROM: Full    Dental no notable dental hx. (+) Teeth Intact, Dental Advisory Given   Pulmonary sleep apnea , former smoker,    Pulmonary exam normal breath sounds clear to auscultation       Cardiovascular hypertension, Pt. on medications and Pt. 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 fib with RVR   Neuro/Psych CVA, No Residual Symptoms negative psych ROS   GI/Hepatic Neg liver ROS, GERD  Medicated and Controlled,  Endo/Other  Hyperthyroidism SIADH  Renal/GU negative Renal ROS  negative genitourinary   Musculoskeletal  (+) Arthritis , Osteoarthritis,    Abdominal   Peds  Hematology negative hematology ROS (+)   Anesthesia Other Findings   Reproductive/Obstetrics negative OB ROS                            Anesthesia Physical  Anesthesia Plan  ASA: III and emergent  Anesthesia Plan: General   Post-op Pain Management:    Induction: Intravenous  PONV Risk Score and Plan: 3 and Ondansetron, Dexamethasone and Treatment may vary due to age or medical condition  Airway Management Planned: Oral ETT  Additional Equipment:   Intra-op Plan:   Post-operative Plan: Possible Post-op intubation/ventilation  Informed Consent: I have reviewed the patients History and Physical, chart, labs and discussed the procedure including the risks, benefits and alternatives for the proposed anesthesia  with the patient or authorized representative who has indicated his/her understanding and acceptance.   Dental advisory given  Plan Discussed with: CRNA and Anesthesiologist  Anesthesia Plan Comments:        Anesthesia Quick Evaluation

## 2018-03-20 NOTE — Progress Notes (Signed)
Initial Nutrition Assessment  DOCUMENTATION CODES:   Severe malnutrition in context of chronic illness  INTERVENTION:  Ensure Enlive po TID, each supplement provides 350 kcal and 20 grams of protein MVI- daily  NUTRITION DIAGNOSIS:   Severe Malnutrition related to chronic illness as evidenced by percent weight loss, severe muscle depletion, energy intake < or equal to 75% for > or equal to 1 month(11% <1 month)   GOAL:   Patient will meet greater than or equal to 90% of their needs  MONITOR:   PO intake, Supplement acceptance, Weight trends, Labs, I & O's, Skin  REASON FOR ASSESSMENT:   Malnutrition Screening Tool    ASSESSMENT:   84 y.o. M admitted on 03/19/18 for recurrent subdural hematoma recently discharged from rehab. PSH subdural hematoma surgeries - burr hole (March and April 2019). Pt with weakness on right side extremities. Pt s/p craniotomy 03/20/18 am. PMH of htn, a fib., BPH, [name], hyperlipidemia, microscopic hematuria, mitral valve prolapse, SIADH, CHF, [name], SDH (01/2018), hx of MI and stroke, hx of skin cancer.   Pt reports a 30 lbs weight loss in the 6 weeks he has been in and out of the hospital; he reports going down two clothing sizes. During this period he also reports having decreased appetite and that he did care for the foods that were not as salty as what he is used to. During his hospital stays he would consume less than a fistful of his food, but he would take ensure and his favorite is chocolate. Before these past 6 weeks pt reports eating fast food for three meals a day with a good appetite.  Pt reports weakness in legs and uses a walker to move around. Pt aware of his depletions and his wife and him have bought some ensure; pt seemed ready for supplementation to try and "catch up". Discussed with pt the importance of eating protein foods first on tray if he isn't eating much.   Pt with past diagnosis of moderate malnutrition with moderate - severe muscle and  fat depletions (02/20/18, 12/09/17). Pt lost 11% of body weight < one month since last admission (4/16 discharge) - significant. Pt weight trending down since 2014.   Wt Readings from Last 15 Encounters:  03/20/18 159 lb 9.8 oz (72.4 kg)  03/12/18 165 lb (74.8 kg)  03/04/18 164 lb 0.4 oz (74.4 kg)  02/24/18 178 lb 12.7 oz (81.1 kg)  02/05/18 167 lb 9.6 oz (76 kg)  01/29/18 193 lb 9 oz (87.8 kg)  12/08/17 175 lb (79.4 kg)  11/27/17 175 lb 12.8 oz (79.7 kg)  11/21/16 201 lb (91.2 kg)  10/17/16 195 lb 1.9 oz (88.5 kg)  12/06/14 198 lb (89.8 kg)  11/23/13 200 lb 12.8 oz (91.1 kg)  11/08/13 204 lb 6.4 oz (92.7 kg)   Medications reviewed: colace, senokot, pepcid, lactated ringers infusion, keppra.   Labs reviewed: Na 126 (L), Cl 91 (L), RBC 3.99 (L), hemoglobin 12.5 (L), HCT 36.6 (L), platelets 114 (L).    Intake/Output Summary (Last 24 hours) at 03/20/2018 1338 Last data filed at 03/20/2018 0800 Gross per 24 hour  Intake 575 ml  Output 930 ml  Net -355 ml   Lab Results  Component Value Date   HGBA1C 5.8 (H) 12/09/2017    NUTRITION - FOCUSED PHYSICAL EXAM:    Most Recent Value  Orbital Region  Unable to assess  Upper Arm Region  Moderate depletion  Thoracic and Lumbar Region  Mild depletion  Buccal Region  No depletion  Temple Region  Unable to assess  Clavicle Bone Region  Moderate depletion  Clavicle and Acromion Bone Region  Moderate depletion  Scapular Bone Region  Severe depletion  Dorsal Hand  Severe depletion  Patellar Region  Severe depletion  Anterior Thigh Region  Severe depletion [R side more depleted ]  Posterior Calf Region  Severe depletion  Edema (RD Assessment)  None  Hair  Reviewed  Eyes  Reviewed  Mouth  Reviewed  Skin  Reviewed  Nails  Reviewed       Diet Order:   Diet Order           Diet regular Room service appropriate? Yes; Fluid consistency: Thin  Diet effective now          EDUCATION NEEDS:   Education needs have been  addressed  Skin:  Skin Assessment: Skin Integrity Issues: Skin Integrity Issues:: Stage I, Incisions Stage I: buttocks Incisions: L head  Last BM:  03/19/18  Height:   Ht Readings from Last 1 Encounters:  03/20/18 5\' 10"  (1.778 m)    Weight:   Wt Readings from Last 1 Encounters:  03/20/18 159 lb 9.8 oz (72.4 kg)    Ideal Body Weight:  75.45 kg  BMI:  Body mass index is 22.9 kg/m.  Estimated Nutritional Needs:   Kcal:  2000-2200 kcal  Protein:  100-115 grams  Fluid:  >/= 1.8 L or per MD  Hope Budds, Dietetic Intern

## 2018-03-20 NOTE — Discharge Instructions (Signed)

## 2018-03-20 NOTE — ED Provider Notes (Signed)
Care assumed from Dr. Tyrone Nine.  Patient with CT scan showing increasing size of left subdural hematoma.  Patient is awake and alert in spite of this.  Case is discussed with Dr. Ellene Route of neurosurgery service who agrees to admit the patient for operative management of subdural hematoma.  I have reviewed his lab work.  Moderate hyponatremia is present and not significantly changed from baseline.  Mild thrombocytopenia present which is probably not clinically significant.  Mild anemia is present and unchanged from baseline.   Delora Fuel, MD 56/[birth year]63 [phone]

## 2018-03-20 NOTE — Transfer of Care (Signed)
Immediate Anesthesia Transfer of Care Note  Patient: Irbin Ehrman  Procedure(s) Performed: Craniotomy for subdural hematoma (Left Head)  Patient Location: PACU  Anesthesia Type:General  Level of Consciousness: awake, alert  and oriented  Airway & Oxygen Therapy: Patient connected to nasal cannula oxygen  Post-op Assessment: Report given to RN, Post -op Vital signs reviewed and stable and Patient moving all extremities X 4  Post vital signs: Reviewed and stable  Last Vitals:  Vitals Value Taken Time  BP 128/84 03/20/2018  4:16 AM  Temp    Pulse 87 03/20/2018  4:18 AM  Resp 19 03/20/2018  4:18 AM  SpO2 99 % 03/20/2018  4:18 AM  Vitals shown include unvalidated device data.  Last Pain:  Vitals:   03/19/18 2113  PainSc: 0-No pain         Complications: No apparent anesthesia complications

## 2018-03-20 NOTE — Anesthesia Postprocedure Evaluation (Signed)
Anesthesia Post Note  Patient: Ryan Savage  Procedure(s) Performed: Craniotomy for subdural hematoma (Left Head)     Patient location during evaluation: PACU Anesthesia Type: General Level of consciousness: sedated Pain management: pain level controlled Vital Signs Assessment: post-procedure vital signs reviewed and stable Respiratory status: spontaneous breathing and respiratory function stable Cardiovascular status: stable Postop Assessment: no apparent nausea or vomiting Anesthetic complications: no    Last Vitals:  Vitals:   03/20/18 0530 03/20/18 0600  BP:  107/69  Pulse:  79  Resp:  19  Temp: (!) 36.4 C   SpO2:  100%    Last Pain:  Vitals:   03/20/18 0600  TempSrc:   PainSc: 0-No pain                 Silver Achey DANIEL

## 2018-03-20 NOTE — Op Note (Signed)
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  He then reaccumulated the left subdural hematoma and underwent a repeat bur hole drainage.  He underwent a period of time in rehab and was recently discharged when it was noted that he started developing weakness on the right arm and right leg.  He had several falls today.  He was brought back to the emergency room and a repeat CT scan shows that he has left to right shift with reaccumulation of a large left chronic and subacute subdural hematoma.  There is evidence of membranous formation with several layers in the hematoma.  Is been advised regarding the need to repeat the bur holes and the possibility that he may require a craniotomy.  Procedure: Patient was brought to the operating room supine on the stretcher after the smooth induction of general endotracheal anesthesia his head was turned to the right.  The left frontal temporal and parietal regions were shaved prepped with alcohol DuraPrep and draped in a sterile fashion.  Then the anterior incision was reopened and the bur hole was identified.  There is Gelfoam in the bur hole and this was evacuated away underneath this a small amount of yellowish subdural fluid was evacuated.  The parietal bur hole was then opened in a similar relief was obtained.  Because the amount of fluid was fairly small and dissecting in the bur hole was difficult to relieve any other fluid is decided that a formal craniotomy should be performed.  Then the length between the anterior and the posterior bur holes was opened and a self-retaining  retractor was used to reflect the galea and maintain hemostasis the periosteal elevator was used to elevate the bone down to the temporalis fascia anteriorly.  A craniotome was then used to connect the 2 bur holes creating an elliptical bone flap between them.  The bone was laid aside and the dura was then opened with sharp scissors and densely connected to the underlying dura was a thick membranous accretion.  This was stripped from either side and as the membrane was peeled back several layers of fluid were encountered.  Membrane was then sectioned and removed in a piecemeal fashion connecting all the layers and removing substantial chronic and subacute subdural fluid.  With this of the cavity was evacuated nicely and the pressure on the brain was relieved such that a pulsatile surface of the brain was identified.  Hemostasis around the edges of the membrane was easily obtained.  In the end of the brain appeared to raise itself back to the level leaving only approximately a centimeter of space it was decided to replace the bone flap and closed the dura primarily leaving a Jackson-Pratt drain in the subdural space.  The area was irrigated copiously with irrigant and the irrigant was noted to be clear the dura was closed with 4-0 Nurolon was tacked up around the perimetry.  The bone flap was replaced with ample room around the anterior hole of the drain for small titanium plates were used to secure the bone.  With this the galea was closed with 2-0 Vicryl in interrupted fashion surgical staples were used on the scalp x-rays were obtained in the operating room as there was one  small TF needle from the 4-0 Nurolon that was lost during the case but it was not believed to be in the wound itself the radiographs confirm this.  With this the patient had a dressing placed onto the scalp and his head was wrapped with a Curlex and is returned to the recovery room for further postoperative management.  Blood loss for the  procedure was estimated at less than 100 cc

## 2018-03-20 NOTE — Progress Notes (Signed)
Patient ID: Ryan Savage, male   DOB: [birth year], 84 y.o.   MRN: [id number] Vital signs are stable Drain with some modest output No suction being applied to JP at this time Continue to monitor Will repeat CT in the next day or 2

## 2018-03-20 NOTE — H&P (Signed)
Ryan Savage is an 84 y.o. male.   Chief Complaint: Recurrent subdural hematoma HPI: Ryan Savage is an 84 year old individual who was recently discharged from rehab he has had 2 operations one in March and again in April for subdural hematoma initially had bilateral bur hole drainage and subsequently had a left subdural bur hole done by Drs. Marlyce Huge initially and then Newman Pies.  He had been doing well but then developed weakness on his right side in the arm and in the leg such that he had several falls.  Presented to the emergency room this evening and the CT scan shows that he has reaccumulated the left subdural hematoma now with significant shift left to right.  There is subfalcine herniation.  He is been advised that he needs to go back to the operating room for further drainage of the subdural hematoma and it may require a craniotomy to be performed.  Past Medical History:  Diagnosis Date  . Atrial fibrillation, chronic (HCC)    Not on anticoagulation due to history of bilateral subdural bleeds due to recurrent falls  . BPH (benign prostatic hypertrophy)   . [name] (coronary artery disease), native coronary artery    Severe two-vessel [name] with chronically occluded LAD and RCA with widely patent left main, intermediate, and left circumflex branches.  On medical management.  He has extensive collaterals.  . 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 fib with RVR  . [name] (degenerative joint disease)   . GERD (gastroesophageal reflux disease)   . Hyperlipidemia    LDL goal less then 100  . Hypertension   . Microscopic hematuria    [name]  . Mitral valve prolapse    with moderate MR  . Myocardial infarction [facility])    "was told 11/2017 that he'd had a heart attack; don't know when it was" (02/19/2018)  . OSA (obstructive sleep  apnea)    intolerant with CPAP (02/19/2018)  . SDH (subdural hematoma) ([place]) 01/2018  . SIADH (syndrome of inappropriate ADH production) ([place])   . Skin cancer    "side of my nose" (11/19/2017)  . Stroke ([place]) 12/2017   "fully recovered"  . Thrombocytopenia (HCC)    Mild- platelet count 143,000 on 02/2011, stable 08/2011    Past Surgical History:  Procedure Laterality Date  . APPENDECTOMY  1950  . BACK SURGERY    . BURR HOLE Bilateral 01/25/2018   Procedure: Haskell Flirt;  Surgeon: Kary Kos, MD;  Location: [place];  Service: Neurosurgery;  Laterality: Bilateral;  . BURR HOLE Left 02/20/2018   Procedure: BURR HOLES for subdural hematoma;  Surgeon: Newman Pies, MD;  Location: [place];  Service: Neurosurgery;  Laterality: Left;  . FOREARM FRACTURE SURGERY Right 1954  . LUMBAR DISC SURGERY  1959   ruptured disc repair  . RIGHT/LEFT HEART CATH AND CORONARY ANGIOGRAPHY N/A 11/21/2017   Procedure: RIGHT/LEFT HEART CATH AND CORONARY ANGIOGRAPHY;  Surgeon: Sherren Mocha, MD;  Location: [place] [facility];  Service: Cardiovascular;  Laterality: N/A;  . SKIN CANCER EXCISION Left    "side of my nose"    Family History  Problem Relation Age of Onset  . Heart failure Mother   . Heart attack Father   . [name] Father   . Heart attack Brother   . [name] Brother   . Colon cancer Brother   .  Colon cancer Sister    Social History:  reports that he quit smoking about 61 years ago. His smoking use included cigarettes. He quit after 3.00 years of use. He has never used smokeless tobacco. He reports that he does not drink alcohol or use drugs.  Allergies:  Allergies  Allergen Reactions  . Novocain [Procaine] Rash     (Not in a hospital admission)  Results for orders placed or performed during the hospital encounter of 03/19/18 (from the past 48 hour(s))  CBC with Differential     Status: Abnormal   Collection Time: 03/19/18 10:21 PM  Result Value Ref Range   WBC 6.4 4.0 - 10.5 K/uL   RBC 3.99 (L)  4.22 - 5.81 MIL/uL   Hemoglobin 12.5 (L) 13.0 - 17.0 g/dL   HCT 36.6 (L) 39.0 - 52.0 %   MCV 91.7 78.0 - 100.0 fL   MCH 31.3 26.0 - 34.0 pg   MCHC 34.2 30.0 - 36.0 g/dL   RDW 13.6 11.5 - 15.5 %   Platelets 114 (L) 150 - 400 K/uL    Comment: REPEATED TO VERIFY SPECIMEN CHECKED FOR CLOTS PLATELET COUNT CONFIRMED BY SMEAR    Neutrophils Relative % 59 %   Neutro Abs 3.8 1.7 - 7.7 K/uL   Lymphocytes Relative 24 %   Lymphs Abs 1.5 0.7 - 4.0 K/uL   Monocytes Relative 15 %   Monocytes Absolute 1.0 0.1 - 1.0 K/uL   Eosinophils Relative 2 %   Eosinophils Absolute 0.1 0.0 - 0.7 K/uL   Basophils Relative 0 %   Basophils Absolute 0.0 0.0 - 0.1 K/uL    Comment: Performed at [facility], [address] [place], [place] [address]  Basic metabolic panel     Status: Abnormal   Collection Time: 03/19/18 10:21 PM  Result Value Ref Range   Sodium 126 (L) 135 - 145 mmol/L   Potassium 4.3 3.5 - 5.1 mmol/L   Chloride 91 (L) 101 - 111 mmol/L   CO2 27 22 - 32 mmol/L   Glucose, Bld 99 65 - 99 mg/dL   BUN 16 6 - 20 mg/dL   Creatinine, Ser 0.73 0.61 - 1.24 mg/dL   Calcium 8.3 (L) 8.9 - 10.3 mg/dL   GFR calc non Af Amer >60 >60 mL/min   GFR calc Af Amer >60 >60 mL/min    Comment: (NOTE) The eGFR has been calculated using the CKD EPI equation. This calculation has not been validated in all clinical situations. eGFR's persistently <60 mL/min signify possible Chronic Kidney Disease.    Anion gap 8 5 - 15    Comment: Performed at [facility] [address], [place], [place] [address]  I-stat troponin, ED     Status: None   Collection Time: 03/19/18 10:31 PM  Result Value Ref Range   Troponin i, poc 0.02 0.00 - 0.08 ng/mL   Comment 3            Comment: Due to the release kinetics of cTnI, a negative result within the first hours of the onset of symptoms does not rule out myocardial infarction with certainty. If myocardial infarction is still suspected, repeat the test at  appropriate intervals.   Urinalysis, Routine w reflex microscopic     Status: Abnormal   Collection Time: 03/19/18 10:46 PM  Result Value Ref Range   Color, Urine YELLOW YELLOW   APPearance CLOUDY (A) CLEAR   Specific Gravity, Urine 1.017 1.005 - 1.030  pH 7.0 5.0 - 8.0   Glucose, UA NEGATIVE NEGATIVE mg/dL   Hgb urine dipstick SMALL (A) NEGATIVE   Bilirubin Urine NEGATIVE NEGATIVE   Ketones, ur NEGATIVE NEGATIVE mg/dL   Protein, ur NEGATIVE NEGATIVE mg/dL   Nitrite NEGATIVE NEGATIVE   Leukocytes, UA NEGATIVE NEGATIVE   RBC / HPF 11-20 0 - 5 RBC/hpf   WBC, UA 0-5 0 - 5 WBC/hpf   Bacteria, UA NONE SEEN NONE SEEN   Squamous Epithelial / LPF 0-5 0 - 5   Mucus PRESENT    Amorphous Crystal PRESENT     Comment: Performed at [facility], [place] [address], [place], [place] [address]   Dg Chest 2 View  Result Date: 03/19/2018 CLINICAL DATA:  Patient found under kitchen table with weakness over the past few days worsening over the past 24 hours. EXAM: CHEST - 2 VIEW COMPARISON:  11/18/2017 FINDINGS: Stable cardiomegaly with aortic atherosclerosis. No alveolar consolidation, overt pulmonary edema, effusion or pneumothorax. Mild chronic interstitial prominence noted that may reflect age related interstitial lung disease. Degenerative changes are present along the dorsal spine. IMPRESSION: Cardiomegaly with aortic atherosclerosis. No active pulmonary disease. Mild chronic interstitial prominence. Electronically Signed   By: Ashley Royalty M.D.   On: 03/19/2018 22:20   Ct Head Wo Contrast  Result Date: 03/19/2018 CLINICAL DATA:  Subdural hematoma EXAM: CT HEAD WITHOUT CONTRAST TECHNIQUE: Contiguous axial images were obtained from the base of the skull through the vertex without intravenous contrast. COMPARISON:  None. FINDINGS: Brain: Left convexity subdural hematoma has increased in size, now measuring approximately 17 mm, previously 12 mm. There is disproportionate worsening of the mass effect on  the left side of the brain. At the level of the foramina of [place], there is now 6 mm of rightward midline shift where previously there was none. There is early subfalcine herniation of the cingulate gyrus anteriorly. Basal cisterns remain patent. There is mass effect on the left lateral ventricle without ventricular entrapment or hydrocephalus. Vascular: No hyperdense vessel or unexpected vascular calcification. Skull: Bilateral frontal and parietal burr holes. Sinuses/Orbits: No sinus fluid levels or advanced mucosal thickening. No mastoid effusion. Normal orbits. IMPRESSION: Increased size of left convexity subdural hematoma, with worsening mass effect and early rightward subfalcine herniation. Critical Value/emergent results were called by telephone at the time of interpretation on 03/19/2018 at 10:53 pm to Dr. Deno Etienne , who verbally acknowledged these results. Electronically Signed   By: Ulyses Jarred M.D.   On: 03/19/2018 22:54    Review of Systems  Constitutional: Positive for malaise/fatigue.  Respiratory: Negative.   Cardiovascular: Negative.   Gastrointestinal: Negative.   Genitourinary: Negative.   Musculoskeletal: Negative.   Skin: Negative.   Neurological: Positive for focal weakness.  Endo/Heme/Allergies: Negative.   Psychiatric/Behavioral: Negative.     Blood pressure 119/81, pulse 68, resp. rate (!) 21, height 5' 10"  (1.778 m), weight 72.1 kg (159 lb), SpO2 99 %. Physical Exam  Constitutional: He appears well-developed and well-nourished.  HENT:  Head: Normocephalic.  Scars from previous bur holes noted  Eyes: Pupils are equal, round, and reactive to light. Conjunctivae and EOM are normal.  Neck: Normal range of motion. Neck supple.  Musculoskeletal: Normal range of motion.  Neurological:  Right sided cortical drift right lower extremity weakness compared to the left side.  Face appears symmetric.  Cranial nerve examination is normal.  Gait was not tested.  Skin: Skin is  warm and dry.  Psychiatric: He has a normal mood  and affect. His behavior is normal. Judgment and thought content normal.     Assessment/Plan Recurrent subdural hematoma left frontal temporal parietal.  Plan: Bur hole drainage of subdural hematoma possible craniotomy  Earleen Newport, MD 03/20/2018, 1:54 AM

## 2018-03-20 NOTE — Anesthesia Procedure Notes (Addendum)
Procedure Name: Intubation Date/Time: 03/20/2018 2:10 AM Performed by: Valetta Fuller, CRNA Pre-anesthesia Checklist: Patient identified, Emergency Drugs available, Suction available and Patient being monitored Patient Re-evaluated:Patient Re-evaluated prior to induction Oxygen Delivery Method: Circle system utilized Preoxygenation: Pre-oxygenation with 100% oxygen Induction Type: IV induction, Rapid sequence and Cricoid Pressure applied Laryngoscope Size: Miller and 2 Grade View: Grade I Tube type: Subglottic suction tube Tube size: 7.5 mm Number of attempts: 1 Airway Equipment and Method: Stylet Placement Confirmation: ETT inserted through vocal cords under direct vision,  positive ETCO2 and breath sounds checked- equal and bilateral Secured at: 24 cm Dental Injury: Teeth and Oropharynx as per pre-operative assessment

## 2018-03-21 MED ORDER — CHLORHEXIDINE GLUCONATE CLOTH 2 % EX PADS
6.0000 | MEDICATED_PAD | Freq: Every day | CUTANEOUS | Status: AC
  Administered 2018-03-21 – 2018-03-24 (×4): 6 via TOPICAL

## 2018-03-21 MED ORDER — FAMOTIDINE 20 MG PO TABS
20.0000 mg | ORAL_TABLET | Freq: Two times a day (BID) | ORAL | Status: DC
  Administered 2018-03-21 – 2018-03-25 (×8): 20 mg via ORAL
  Filled 2018-03-21 (×8): qty 1

## 2018-03-21 NOTE — Evaluation (Signed)
Physical Therapy Evaluation Patient Details Name: Ryan Savage MRN: [id number] DOB: 09-08-1929 Today's Date: 03/21/2018   History of Present Illness  pt is an 84 y/o male with pmh significant for stroke, MI, HTN, [name], [name], recurrent SDH's, admitted with c/o of R LE weakness.      CT shows increasing size of L SDH with mass effect.  5/10 pt s/p Left frontoptemporoparietal crani for evacuation of chronic and subacute SDH.  Clinical Impression  Pt admitted with/for recurrent SDH, s/p crani.  Pt is mildly hemiparetic and needing min assist for gait and basic mobility.  Pt currently limited functionally due to the problems listed below.  (see problems list.)  Pt will benefit from PT to maximize function and safety to be able to get home safely with available assist .     Follow Up Recommendations Home health PT;Supervision/Assistance - 24 hour    Equipment Recommendations  Rolling walker with 5" wheels    Recommendations for Other Services       Precautions / Restrictions Precautions Precautions: Fall      Mobility  Bed Mobility               General bed mobility comments: NT, OOB in the chair  Transfers Overall transfer level: Needs assistance   Transfers: Sit to/from Stand Sit to Stand: Min assist         General transfer comment: mild steady assist only.  Ambulation/Gait Ambulation/Gait assistance: Min assist Ambulation Distance (Feet): 140 Feet Assistive device: Rolling walker (2 wheeled) Gait Pattern/deviations: Step-through pattern Gait velocity: decreased   General Gait Details: retropulsive in nature, mildly unsteady  Stairs            Wheelchair Mobility    Modified Rankin (Stroke Patients Only) Modified Rankin (Stroke Patients Only) Pre-Morbid Rankin Score: Slight disability Modified Rankin: Moderate disability     Balance Overall balance assessment: Needs assistance Sitting-balance support: No upper extremity supported Sitting  balance-Leahy Scale: Fair       Standing balance-Leahy Scale: Fair Standing balance comment: pt works hard to control tendency to list posteriorly                             Pertinent Vitals/Pain Pain Assessment: No/denies pain    Home Living Family/patient expects to be discharged to:: Private residence Living Arrangements: Spouse/significant other Available Help at Discharge: Family;Available 24 hours/day Type of Home: House Home Access: Stairs to enter Entrance Stairs-Rails: Psychiatric nurse of Steps: 3+3 Home Layout: One level Home Equipment: Walker - standard;Bedside commode;Grab bars - tub/shower;Shower seat Additional Comments: recent admits each month since January    Prior Function Level of Independence: Needs assistance   Gait / Transfers Assistance Needed: Post CIR pt reports that he was able to walk without an AD in the home     Comments: Pt reports multiple falls and multiple admissions since January      Hand Dominance   Dominant Hand: Right    Extremity/Trunk Assessment   Upper Extremity Assessment Upper Extremity Assessment: Defer to OT evaluation    Lower Extremity Assessment Lower Extremity Assessment: Overall WFL for tasks assessed(Proximal weakness R>L)       Communication      Cognition Arousal/Alertness: Awake/alert Behavior During Therapy: WFL for tasks assessed/performed Overall Cognitive Status: Difficult to assess  General Comments: pt is tangential of thought, but with redirection can relate sequence of events      General Comments      Exercises     Assessment/Plan    PT Assessment Patient needs continued PT services  PT Problem List Decreased strength;Decreased activity tolerance;Decreased balance;Decreased mobility;Decreased knowledge of use of DME       PT Treatment Interventions Gait training;DME instruction;Functional mobility training;Stair  training;Therapeutic activities;Balance training;Patient/family education    PT Goals (Current goals can be found in the Care Plan section)  Acute Rehab PT Goals Patient Stated Goal: back home and able to do for myself. PT Goal Formulation: With patient Time For Goal Achievement: 04/04/18 Potential to Achieve Goals: Good    Frequency Min 3X/week   Barriers to discharge        Co-evaluation               AM-PAC PT "6 Clicks" Daily Activity  Outcome Measure Difficulty turning over in bed (including adjusting bedclothes, sheets and blankets)?: Unable Difficulty moving from lying on back to sitting on the side of the bed? : Unable Difficulty sitting down on and standing up from a chair with arms (e.g., wheelchair, bedside commode, etc,.)?: Unable Help needed moving to and from a bed to chair (including a wheelchair)?: A Little Help needed walking in hospital room?: A Little Help needed climbing 3-5 steps with a railing? : A Little 6 Click Score: 12    End of Session Equipment Utilized During Treatment: Gait belt Activity Tolerance: Patient tolerated treatment well Patient left: in chair;with call bell/phone within reach Nurse Communication: Mobility status PT Visit Diagnosis: Unsteadiness on feet (R26.81);Other abnormalities of gait and mobility (R26.89);Other symptoms and signs involving the nervous system (R29.898);Hemiplegia and hemiparesis Hemiplegia - Right/Left: Right Hemiplegia - caused by: Nontraumatic intracerebral hemorrhage    Time: 1523-1550 PT Time Calculation (min) (ACUTE ONLY): 27 min   Charges:   PT Evaluation $PT Eval Moderate Complexity: 1 Mod     PT G Codes:        04/15/18  [name], PT [phone]  (pager)  Tessie Fass [place] 04-15-2018, 6:20 PM

## 2018-03-21 NOTE — Evaluation (Signed)
Occupational Therapy Evaluation Patient Details Name: Ryan Savage MRN: [id number] DOB: [birth year] Today's Date: 03/21/2018    History of Present Illness pt is an 84 y/o male with pmh significant for stroke, MI, HTN, [name], [name], recurrent SDH's, admitted with c/o of R LE weakness.      CT shows increasing size of L SDH with mass effect.  5/10 pt s/p Left frontoptemporoparietal crani for evacuation of chronic and subacute SDH.   Clinical Impression   PTA, pt was living with his wife and performing ADLs and functional mobility at supervision level. Pt with recent admission to CIR for rehab. Pt currently requiring Min A for LB ADLs and functional mobility due to decreased balance and posterior lean. Pt also presenting with decreased cognition requiring increased time for processing and some tangential thought. Pt would benefit from further acute OT to facilitate safe dc. Recommend dc to home with HHOT for further OT to optimize safety, independence with ADLs, and return to PLOF.      Follow Up Recommendations  Home health OT;Supervision/Assistance - 24 hour    Equipment Recommendations  None recommended by OT    Recommendations for Other Services PT consult     Precautions / Restrictions Precautions Precautions: Fall Restrictions Weight Bearing Restrictions: No      Mobility Bed Mobility               General bed mobility comments: NT, OOB in the chair  Transfers Overall transfer level: Needs assistance   Transfers: Sit to/from Stand Sit to Stand: Min assist         General transfer comment: mild steady assist only.    Balance Overall balance assessment: Needs assistance Sitting-balance support: No upper extremity supported Sitting balance-Leahy Scale: Fair       Standing balance-Leahy Scale: Fair Standing balance comment: pt works hard to control tendency to list posteriorly                           ADL either performed or assessed with clinical  judgement   ADL Overall ADL's : Needs assistance/impaired Eating/Feeding: Set up;Sitting   Grooming: Oral care;Min guard;Standing;Cueing for sequencing;Minimal assistance Grooming Details (indicate cue type and reason): Min Verbal cues for locating grooming tasks. Pt requiring Min Guard A for balance and safety. Noted posterior lean with occasional Min A to correct balance Upper Body Bathing: Set up;Supervision/ safety;Sitting   Lower Body Bathing: Minimal assistance;Sit to/from stand Lower Body Bathing Details (indicate cue type and reason): Min A for dynamic balance Upper Body Dressing : Set up;Sitting;Supervision/safety   Lower Body Dressing: Minimal assistance;Sit to/from stand Lower Body Dressing Details (indicate cue type and reason): Min A for dynamic balance. Pt able to adjsut socks by bringing ankles to knees Toilet Transfer: Minimal assistance;Ambulation;Cueing for safety Toilet Transfer Details (indicate cue type and reason): simulated to recliner         Functional mobility during ADLs: Minimal assistance General ADL Comments: Pt with decreased balance and requiring increased time for processing.     Vision Baseline Vision/History: Wears glasses Wears Glasses: At all times Patient Visual Report: No change from baseline Vision Assessment?: No apparent visual deficits     Perception     Praxis      Pertinent Vitals/Pain Pain Assessment: No/denies pain     Hand Dominance Right   Extremity/Trunk Assessment Upper Extremity Assessment Upper Extremity Assessment: Overall WFL for tasks assessed;RUE deficits/detail RUE Deficits / Details: Slight weakness in  RUE compared to left. WFL for grooming tasks   Lower Extremity Assessment Lower Extremity Assessment: Defer to PT evaluation       Communication Communication Communication: Expressive difficulties   Cognition Arousal/Alertness: Awake/alert Behavior During Therapy: WFL for tasks assessed/performed Overall  Cognitive Status: Difficult to assess                                 General Comments: pt is tangential of thought, but with redirection can relate sequence of events   General Comments       Exercises     Shoulder Instructions      Home Living Family/patient expects to be discharged to:: Private residence Living Arrangements: Spouse/significant other Available Help at Discharge: Family;Available 24 hours/day Type of Home: House Home Access: Stairs to enter [employer] of Steps: 3+3 Entrance Stairs-Rails: Right;Left Home Layout: One level     Bathroom Shower/Tub: Walk-in Investment banker, corporate: Standard Bathroom Accessibility: Yes   Home Equipment: Walker - standard;Bedside commode;Grab bars - tub/shower;Shower seat   Additional Comments: recent admits each month since January  Lives With: Spouse    Prior Functioning/Environment Level of Independence: Needs assistance  Gait / Transfers Assistance Needed: Post CIR pt reports that he was able to walk without an AD in the home ADL's / Homemaking Assistance Needed: Post CIR, pt performing BADLs with supervision   Comments: Pt reports multiple falls and multiple admissions since January         OT Problem List: Decreased strength;Decreased range of motion;Decreased activity tolerance;Impaired balance (sitting and/or standing);Decreased cognition;Decreased knowledge of use of DME or AE;Decreased knowledge of precautions      OT Treatment/Interventions: Self-care/ADL training;Therapeutic exercise;Energy conservation;DME and/or AE instruction;Therapeutic activities;Patient/family education;Balance training    OT Goals(Current goals can be found in the care plan section) Acute Rehab OT Goals Patient Stated Goal: back home and able to do for myself. OT Goal Formulation: With patient Time For Goal Achievement: 04/04/18 Potential to Achieve Goals: Good ADL Goals Pt Will  Perform Grooming: with modified independence;standing Pt Will Perform Upper Body Dressing: with modified independence;sitting Pt Will Perform Lower Body Dressing: with modified independence;sit to/from stand Pt Will Transfer to Toilet: with modified independence;ambulating;regular height toilet Pt Will Perform Toileting - Clothing Manipulation and hygiene: with modified independence;sit to/from stand  OT Frequency: Min 2X/week   Barriers to D/C:            Co-evaluation PT/OT/SLP Co-Evaluation/Treatment: Yes Reason for Co-Treatment: For patient/therapist safety;To address functional/ADL transfers   OT goals addressed during session: ADL's and self-care      AM-PAC PT "6 Clicks" Daily Activity     Outcome Measure Help from another person eating meals?: None Help from another person taking care of personal grooming?: A Little Help from another person toileting, which includes using toliet, bedpan, or urinal?: A Little Help from another person bathing (including washing, rinsing, drying)?: A Little Help from another person to put on and taking off regular upper body clothing?: A Little Help from another person to put on and taking off regular lower body clothing?: A Little 6 Click Score: 19   End of Session Nurse Communication: Mobility status  Activity Tolerance: Patient tolerated treatment well Patient left: in chair;with chair alarm set;with call bell/phone within reach  OT Visit Diagnosis: Unsteadiness on feet (R26.81);Other abnormalities of gait and mobility (R26.89);Muscle weakness (generalized) (M62.81);History of falling (Z91.81);Other symptoms and signs involving cognitive  function                Time: 1523-1550 OT Time Calculation (min): 27 min Charges:  OT General Charges $OT Visit: 1 Visit OT Evaluation $OT Eval Moderate Complexity: 1 Mod G-Codes:     Joleah Kosak MSOT, OTR/L Acute Rehab Pager: [phone] Office: [place] 03/21/2018, 6:36 PM

## 2018-03-21 NOTE — Progress Notes (Signed)
Subjective: Patient alert but confused. Denies any headache, NV  Objective: Vital signs in last 24 hours: Temp:  [96.5 F (35.8 C)-98.2 F (36.8 C)] 98.2 F (36.8 C) (05/11 0400) Pulse Rate:  [58-81] 78 (05/11 0500) Resp:  [13-23] 16 (05/11 0500) BP: (94-138)/(54-94) 114/59 (05/11 0500) SpO2:  [96 %-100 %] 98 % (05/11 0500)  Intake/Output from previous day: 05/10 0701 - 05/11 0700 In: 1850 [I.V.:1650; IV Piggyback:200] Out: 805 [Urine:725; Drains:80] Intake/Output this shift: Total I/O In: 950 [I.V.:750; IV Piggyback:200] Out: 335 [Urine:325; Drains:10]  Neurologic: alert and confused. MAE  Lab Results: Lab Results  Component Value Date   WBC 5.0 03/20/2018   HGB 12.5 (L) 03/20/2018   HCT 36.9 (L) 03/20/2018   MCV 92.5 03/20/2018   PLT 123 (L) 03/20/2018   Lab Results  Component Value Date   INR 1.20 01/25/2018   BMET Lab Results  Component Value Date   NA 128 (L) 03/20/2018   K 4.3 03/20/2018   CL 94 (L) 03/20/2018   CO2 25 03/20/2018   GLUCOSE 199 (H) 03/20/2018   BUN 12 03/20/2018   CREATININE 0.74 03/20/2018   CALCIUM 8.0 (L) 03/20/2018    Studies/Results: Dg Skull 1-3 Views  Result Date: 03/20/2018 CLINICAL DATA:  Intraoperative skull radiographs for foreign body, retained needle. EXAM: SKULL - 1-3 VIEW COMPARISON:  None. FINDINGS: Left parietal craniectomy changes with overlying skin staples. No unaccountable radiopaque foreign body is noted. Specifically no needle-like foreign body is identified. Endotracheal and nasogastric tubes are partially included. IMPRESSION: Left parietal craniectomy change. No retained needle-like foreign body. These results were called by telephone at the time of interpretation on 03/20/2018 at 4:00 am to [place], who verbally acknowledged these results for Dr. Ellene Route. Electronically Signed   By: Ashley Royalty M.D.   On: 03/20/2018 04:01   Dg Chest 2 View  Result Date: 03/19/2018 CLINICAL DATA:  Patient found under kitchen table  with weakness over the past few days worsening over the past 24 hours. EXAM: CHEST - 2 VIEW COMPARISON:  11/18/2017 FINDINGS: Stable cardiomegaly with aortic atherosclerosis. No alveolar consolidation, overt pulmonary edema, effusion or pneumothorax. Mild chronic interstitial prominence noted that may reflect age related interstitial lung disease. Degenerative changes are present along the dorsal spine. IMPRESSION: Cardiomegaly with aortic atherosclerosis. No active pulmonary disease. Mild chronic interstitial prominence. Electronically Signed   By: Ashley Royalty M.D.   On: 03/19/2018 22:20   Ct Head Wo Contrast  Result Date: 03/20/2018 CLINICAL DATA:  Altered mental status. Status post subdural hematoma evacuation EXAM: CT HEAD WITHOUT CONTRAST TECHNIQUE: Contiguous axial images were obtained from the base of the skull through the vertex without intravenous contrast. COMPARISON:  None. FINDINGS: Brain: The patient has undergone surgical evacuation of left-sided subdural hematoma. There is a large amount of anterior pneumocephalus that exerts mass effect on the left frontal pole. There is a subdural drainage catheter coursing the left-sided collection with surrounding acute blood. The more posterior portion of the collection is decreased in size. Rightward midline shift is unchanged of 5 mm. Mass effect on the lateral ventricles is unchanged. Minimal rightward subfalcine herniation of the cingulate gyrus is also unchanged. Vascular: No hyperdense vessel or unexpected vascular calcification. Skull: Left frontal and parietal burr holes for drainage catheter. Old right frontal and parietal burr holes. Sinuses/Orbits: No sinus fluid levels or advanced mucosal thickening. No mastoid effusion. Normal orbits. IMPRESSION: Evacuation of left-sided subdural hematoma with unchanged rightward midline shift and early subfalcine herniation. While the  volume of extra-axial blood over the left convexity has decreased, there is  evidence of continued hemorrhage and possible component of tension pneumocephalus maintaining the mass effect. Electronically Signed   By: Ulyses Jarred M.D.   On: 03/20/2018 15:54   Ct Head Wo Contrast  Result Date: 03/19/2018 CLINICAL DATA:  Subdural hematoma EXAM: CT HEAD WITHOUT CONTRAST TECHNIQUE: Contiguous axial images were obtained from the base of the skull through the vertex without intravenous contrast. COMPARISON:  None. FINDINGS: Brain: Left convexity subdural hematoma has increased in size, now measuring approximately 17 mm, previously 12 mm. There is disproportionate worsening of the mass effect on the left side of the brain. At the level of the foramina of [place], there is now 6 mm of rightward midline shift where previously there was none. There is early subfalcine herniation of the cingulate gyrus anteriorly. Basal cisterns remain patent. There is mass effect on the left lateral ventricle without ventricular entrapment or hydrocephalus. Vascular: No hyperdense vessel or unexpected vascular calcification. Skull: Bilateral frontal and parietal burr holes. Sinuses/Orbits: No sinus fluid levels or advanced mucosal thickening. No mastoid effusion. Normal orbits. IMPRESSION: Increased size of left convexity subdural hematoma, with worsening mass effect and early rightward subfalcine herniation. Critical Value/emergent results were called by telephone at the time of interpretation on 03/19/2018 at 10:53 pm to Dr. Deno Etienne , who verbally acknowledged these results. Electronically Signed   By: Ulyses Jarred M.D.   On: 03/19/2018 22:54    Assessment/Plan: Will start therapies today and get patient oob. Continue JP drain to suction   LOS: 1 day    [place] 03/21/2018, 5:54 AM

## 2018-03-22 NOTE — Progress Notes (Signed)
Patient ID: Ryan Savage, male   DOB: 1929-10-11, 84 y.o.   MRN: [id number] Doing well. No complaints. Awaken more appropriate today. Moves all extremities. Incision clean dry and intact. Drain removed. Transferred to the stepdown

## 2018-03-23 ENCOUNTER — Inpatient Hospital Stay (HOSPITAL_COMMUNITY): Payer: Medicare Other

## 2018-03-23 ENCOUNTER — Encounter (HOSPITAL_COMMUNITY): Payer: Self-pay | Admitting: Neurological Surgery

## 2018-03-23 DIAGNOSIS — R0682 Tachypnea, not elsewhere classified: Secondary | ICD-10-CM

## 2018-03-23 DIAGNOSIS — G4733 Obstructive sleep apnea (adult) (pediatric): Secondary | ICD-10-CM

## 2018-03-23 DIAGNOSIS — R739 Hyperglycemia, unspecified: Secondary | ICD-10-CM

## 2018-03-23 DIAGNOSIS — T380X5A Adverse effect of glucocorticoids and synthetic analogues, initial encounter: Secondary | ICD-10-CM

## 2018-03-23 DIAGNOSIS — Z8679 Personal history of other diseases of the circulatory system: Secondary | ICD-10-CM

## 2018-03-23 DIAGNOSIS — I482 Chronic atrial fibrillation: Secondary | ICD-10-CM

## 2018-03-23 DIAGNOSIS — S065X9A Traumatic subdural hemorrhage with loss of consciousness of unspecified duration, initial encounter: Secondary | ICD-10-CM

## 2018-03-23 DIAGNOSIS — E871 Hypo-osmolality and hyponatremia: Secondary | ICD-10-CM

## 2018-03-23 DIAGNOSIS — D62 Acute posthemorrhagic anemia: Secondary | ICD-10-CM

## 2018-03-23 DIAGNOSIS — D696 Thrombocytopenia, unspecified: Secondary | ICD-10-CM

## 2018-03-23 DIAGNOSIS — I2510 Atherosclerotic heart disease of native coronary artery without angina pectoris: Secondary | ICD-10-CM

## 2018-03-23 MED FILL — Thrombin For Soln 20000 Unit: CUTANEOUS | Qty: 1 | Status: AC

## 2018-03-23 MED FILL — Thrombin For Soln 5000 Unit: CUTANEOUS | Qty: 5000 | Status: AC

## 2018-03-23 NOTE — Progress Notes (Signed)
Physical Therapy Treatment Patient Details Name: Ryan Savage MRN: [id number] DOB: 1929-07-14 Today's Date: 03/23/2018    History of Present Illness pt is an 84 y/o male with pmh significant for stroke, MI, HTN, [name], [name], recurrent SDH's, admitted with c/o of R LE weakness.      CT shows increasing size of L SDH with mass effect.  5/10 pt s/p Left frontoptemporoparietal crani for evacuation of chronic and subacute SDH.    PT Comments    With encouragement, pt agreed to participate.  Emphasis on toileting, hygiene at the sink, working on balance in midline and gait training for stability and stamina.  Pt could benefit from a short stent of CIR again to get back to post CIR level.  Pt is not confident with his present mobility status.    Follow Up Recommendations  Other (comment);CIR     Equipment Recommendations       Recommendations for Other Services       Precautions / Restrictions Precautions Precautions: Fall Restrictions Weight Bearing Restrictions: No    Mobility  Bed Mobility Overal bed mobility: Needs Assistance Bed Mobility: Supine to Sit;Sit to Supine     Supine to sit: Min assist Sit to supine: Min assist   General bed mobility comments: MinA to support trunk upright into sitting; close minguard when returning to supine and to assist with lines, pt able to scoot self towards [facility] with increased time/effort   Transfers Overall transfer level: Needs assistance Equipment used: Rolling walker (2 wheeled) Transfers: Sit to/from Stand Sit to Stand: Min assist;+2 safety/equipment         General transfer comment: Steadying assist upon standing; pt completing sit<>stand from EOB and toilet   Ambulation/Gait Ambulation/Gait assistance: Min assist(episodes of mod assist with more retropulsion and fatigue) Ambulation Distance (Feet): 130 Feet Assistive device: Rolling walker (2 wheeled) Gait Pattern/deviations: Step-through pattern Gait velocity: decreased    General Gait Details: retropulsive in nature, mildly unsteady   Stairs             Wheelchair Mobility    Modified Rankin (Stroke Patients Only) Modified Rankin (Stroke Patients Only) Modified Rankin: Moderate disability     Balance Overall balance assessment: Needs assistance Sitting-balance support: No upper extremity supported Sitting balance-Leahy Scale: Good Sitting balance - Comments: able to sit EOB with minguard assist while donning sock   Standing balance support: During functional activity;Bilateral upper extremity supported;Single extremity supported Standing balance-Leahy Scale: Fair Standing balance comment: overall requires MinA to maintain standing balance; minA and cues for posture as pt tends to list posteriorly                             Cognition Arousal/Alertness: Awake/alert Behavior During Therapy: WFL for tasks assessed/performed Overall Cognitive Status: Impaired/Different from baseline Area of Impairment: Attention;Problem solving                   Current Attention Level: Sustained Memory: Decreased short-term memory Following Commands: Follows one step commands consistently;Follows one step commands with increased time     Problem Solving: Decreased initiation;Difficulty sequencing;Requires verbal cues;Requires tactile cues General Comments: pt requires min directional cues for initiating and sequencing through ADL and functional tasks safely      Exercises      General Comments General comments (skin integrity, edema, etc.): pt's spouse present during session       Pertinent Vitals/Pain Pain Assessment: No/denies pain    Home  Living                      Prior Function            PT Goals (current goals can now be found in the care plan section) Acute Rehab PT Goals Patient Stated Goal: back home and able to do for myself. PT Goal Formulation: With patient Time For Goal Achievement:  04/04/18 Potential to Achieve Goals: Good Progress towards PT goals: Progressing toward goals    Frequency    Min 3X/week      PT Plan Discharge plan needs to be updated    Co-evaluation PT/OT/SLP Co-Evaluation/Treatment: Yes Reason for Co-Treatment: For patient/therapist safety;To address functional/ADL transfers PT goals addressed during session: Mobility/safety with mobility OT goals addressed during session: ADL's and self-care      AM-PAC PT "6 Clicks" Daily Activity  Outcome Measure  Difficulty turning over in bed (including adjusting bedclothes, sheets and blankets)?: Unable Difficulty moving from lying on back to sitting on the side of the bed? : Unable Difficulty sitting down on and standing up from a chair with arms (e.g., wheelchair, bedside commode, etc,.)?: Unable Help needed moving to and from a bed to chair (including a wheelchair)?: A Little Help needed walking in hospital room?: A Little Help needed climbing 3-5 steps with a railing? : A Little 6 Click Score: 12    End of Session   Activity Tolerance: Patient tolerated treatment well Patient left: in chair;with call bell/phone within reach Nurse Communication: Mobility status PT Visit Diagnosis: Unsteadiness on feet (R26.81);Other abnormalities of gait and mobility (R26.89);Other symptoms and signs involving the nervous system (R29.898);Hemiplegia and hemiparesis Hemiplegia - Right/Left: Right Hemiplegia - dominant/non-dominant: Dominant Hemiplegia - caused by: Nontraumatic intracerebral hemorrhage     Time: 3664-4034 PT Time Calculation (min) (ACUTE ONLY): 41 min  Charges:  $Gait Training: 8-22 mins                    G Codes:       04/22/2018  [name], PT [phone]  (pager)   [name] April 22, 2018, 3:03 PM

## 2018-03-23 NOTE — Consult Note (Addendum)
Physical Medicine and Rehabilitation Consult   Reason for Consult: Recurrent SDH with gait disorder Referring Physician: Dr. Ellene Route   HPI: Ryan Savage is a 84 y.o. male with history of OSA, [name], [name],  [name], multiple SDH s/p evacuation on 01/25/18 and 02/19/18 with recent rehab stay and was discharged to home on 4/24 with [facility] therapy. History taken from chart review, patient, and wife. He was readmitted on 03/19/18 with right sided weakness and fall. CT head reviewed, showing left SDH.  Per report, reaccumulation of left SDH with mass effect and early rightward subfalcine herniation. He underwent emergent left crani for evacuation of bleed by Dr. Ellene Route.  Post op, he has had mild confusion, delayed processing as well as balance deficits with retropulsion with standing/ambulation.  CIR recommended due to functional deficits.   Review of Systems  Constitutional: Positive for malaise/fatigue.  HENT: Negative for hearing loss and tinnitus.   Eyes: Negative for blurred vision and double vision.  Respiratory: Negative for cough and sputum production.   Cardiovascular: Negative for chest pain and palpitations.  Gastrointestinal: Negative for abdominal pain, heartburn and nausea.  Genitourinary: Negative for dysuria and urgency.  Musculoskeletal: Negative for back pain and myalgias.  Neurological: Positive for sensory change, speech change and weakness.  Psychiatric/Behavioral: Positive for memory loss. The patient is not nervous/anxious and does not have insomnia.   All other systems reviewed and are negative.   Past Medical History:  Diagnosis Date  . Atrial fibrillation, chronic (HCC)    Not on anticoagulation due to history of bilateral subdural bleeds due to recurrent falls  . BPH (benign prostatic hypertrophy)   . [name] (coronary artery disease), native coronary artery    Severe two-vessel [name] with chronically occluded LAD and RCA with  widely patent left main, intermediate, and left circumflex branches.  On medical management.  He has extensive collaterals.  . 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 fib with RVR  . [name] (degenerative joint disease)   . GERD (gastroesophageal reflux disease)   . Hyperlipidemia    LDL goal less then 100  . Hypertension   . Microscopic hematuria    [name]  . Mitral valve prolapse    with moderate MR  . Myocardial infarction [facility])    "was told 11/2017 that he'd had a heart attack; don't know when it was" (02/19/2018)  . OSA (obstructive sleep apnea)    intolerant with CPAP (02/19/2018)  . SDH (subdural hematoma) ([place]) 01/2018  . SIADH (syndrome of inappropriate ADH production) ([place])   . Skin cancer    "side of my nose" (11/19/2017)  . Stroke ([place]) 12/2017   "fully recovered"  . Thrombocytopenia (HCC)    Mild- platelet count 143,000 on 02/2011, stable 08/2011    Past Surgical History:  Procedure Laterality Date  . APPENDECTOMY  1950  . BACK SURGERY    . BURR HOLE Bilateral 01/25/2018   Procedure: Haskell Flirt;  Surgeon: Kary Kos, MD;  Location: [place];  Service: Neurosurgery;  Laterality: Bilateral;  . BURR HOLE Left 02/20/2018   Procedure: BURR HOLES for subdural hematoma;  Surgeon: Newman Pies, MD;  Location: [place];  Service: Neurosurgery;  Laterality: Left;  . FOREARM FRACTURE SURGERY Right 1954  . [place]   ruptured disc  repair  . RIGHT/LEFT HEART CATH AND CORONARY ANGIOGRAPHY N/A 11/21/2017   Procedure: RIGHT/LEFT HEART CATH AND CORONARY ANGIOGRAPHY;  Surgeon: Sherren Mocha, MD;  Location: [place] CV LAB;  Service: Cardiovascular;  Laterality: N/A;  . SKIN CANCER EXCISION Left    "side of my nose"   Family History  Problem Relation Age of Onset  . Heart failure Mother   . Heart attack Father   .  [name] Father   . Heart attack Brother   . [name] Brother   . Colon cancer Brother   . Colon cancer Sister      Social History:  Married. Wife supportive and son checks in on them. He was ambulating with walker PTA. He  reports that he quit smoking about 61 years ago. His smoking use included cigarettes. He quit after 3.00 years of use. He has never used smokeless tobacco. He reports that he does not drink alcohol or use drugs.   Allergies  Allergen Reactions  . Novocain [Procaine] Rash   Medications Prior to Admission  Medication Sig Dispense Refill  . atorvastatin (LIPITOR) 20 MG tablet Take 1 tablet (20 mg total) by mouth every evening. 30 tablet 0  . digoxin (LANOXIN) 0.125 MG tablet Take 1 tablet (0.125 mg total) by mouth daily. 30 tablet 0  . finasteride (PROSCAR) 5 MG tablet Take 1 tablet (5 mg total) by mouth daily. 30 tablet 0  . fluticasone (FLONASE) 50 MCG/ACT nasal spray Place 1 spray into both nostrils daily.    Marland Kitchen gabapentin (NEURONTIN) 300 MG capsule Take 1 capsule (300 mg total) by mouth at bedtime. 30 capsule 0  . levETIRAcetam (KEPPRA) 500 MG tablet Take 1 tablet (500 mg total) by mouth 2 (two) times daily. 60 tablet 0  . metoprolol succinate (TOPROL-XL) 25 MG 24 hr tablet Take 1 tablet (25 mg total) by mouth daily. 30 tablet 0  . ranitidine (ZANTAC) 150 MG tablet Take 150 mg by mouth 2 (two) times daily.    . traZODone (DESYREL) 50 MG tablet Take 0.5-1 tablets (25-50 mg total) by mouth at bedtime as needed for sleep. 30 tablet 0  . docusate sodium (COLACE) 100 MG capsule Take 1 capsule (100 mg total) by mouth daily as needed for mild constipation. (Patient not taking: Reported on 03/19/2018) 10 capsule 0  . pantoprazole (PROTONIX) 40 MG tablet Take 1 tablet (40 mg total) by mouth every evening. (Patient not taking: Reported on 03/19/2018) 30 tablet 0    Home: Home Living Family/patient expects to be discharged to:: Private residence Living Arrangements: Spouse/significant  other Available Help at Discharge: Family, Available 24 hours/day Type of Home: House Home Access: Stairs to enter [employer] of Steps: 3+3 Entrance Stairs-Rails: Right, Left Home Layout: One level Bathroom Shower/Tub: Gaffer, Curtain, Chiropodist: Standard Bathroom Accessibility: Yes Home Equipment: Environmental consultant - standard, Bedside commode, Grab bars - tub/shower, Shower seat Additional Comments: recent admits each month since January  Lives With: Spouse  Functional History: Prior Function Level of Independence: Needs assistance Gait / Transfers Assistance Needed: Post CIR pt reports that he was able to walk without an AD in the home ADL's / Homemaking Assistance Needed: Post CIR, pt performing BADLs with supervision Comments: Pt reports multiple falls and multiple admissions since January  Functional Status:  Mobility: Bed Mobility General bed mobility comments: NT, OOB in the chair Transfers Overall transfer level: Needs assistance Transfers: Sit to/from Stand Sit to Stand: Min assist General transfer comment: mild steady assist  only. Ambulation/Gait Ambulation/Gait assistance: Min assist Ambulation Distance (Feet): 140 Feet Assistive device: Rolling walker (2 wheeled) Gait Pattern/deviations: Step-through pattern General Gait Details: retropulsive in nature, mildly unsteady Gait velocity: decreased    ADL: ADL Overall ADL's : Needs assistance/impaired Eating/Feeding: Set up, Sitting Grooming: Oral care, Min guard, Standing, Cueing for sequencing, Minimal assistance Grooming Details (indicate cue type and reason): Min Verbal cues for locating grooming tasks. Pt requiring Min Guard A for balance and safety. Noted posterior lean with occasional Min A to correct balance Upper Body Bathing: Set up, Supervision/ safety, Sitting Lower Body Bathing: Minimal assistance, Sit to/from stand Lower Body Bathing Details (indicate cue type and  reason): Min A for dynamic balance Upper Body Dressing : Set up, Sitting, Supervision/safety Lower Body Dressing: Minimal assistance, Sit to/from stand Lower Body Dressing Details (indicate cue type and reason): Min A for dynamic balance. Pt able to adjsut socks by bringing ankles to knees Toilet Transfer: Minimal assistance, Ambulation, Cueing for safety Toilet Transfer Details (indicate cue type and reason): simulated to recliner Functional mobility during ADLs: Minimal assistance General ADL Comments: Pt with decreased balance and requiring increased time for processing.  Cognition: Cognition Overall Cognitive Status: Difficult to assess Orientation Level: Oriented to person, Disoriented to place, Disoriented to time, Disoriented to situation Cognition Arousal/Alertness: Awake/alert Behavior During Therapy: WFL for tasks assessed/performed Overall Cognitive Status: Difficult to assess General Comments: pt is tangential of thought, but with redirection can relate sequence of events   Blood pressure (!) 134/93, pulse 82, temperature (!) 97.3 F (36.3 C), resp. rate (!) 21, height 5\' 10"  (1.778 m), weight 79.4 kg (175 lb 0.7 oz), SpO2 96 %. Physical Exam  Vitals reviewed. Constitutional: He appears well-developed and well-nourished.  HENT:  Head: Normocephalic and atraumatic.  Eyes: EOM are normal. Right eye exhibits no discharge. Left eye exhibits no discharge.  Neck: Normal range of motion. Neck supple.  Cardiovascular:  Irregularly irregular  Respiratory: Effort normal and breath sounds normal.  GI: Soft. Bowel sounds are normal.  Musculoskeletal:  No edema or tenderness in extremities  Neurological: He is alert.  He was able to answer orientation questions with minimal cues.  Motor: RUE/RLE: 4+/5 proximal to distal LUE/LLE: 4+-5/5 proximal to distal RLE with ataxia.  Sensation intact to light touch  Skin: Skin is warm and dry.  Psychiatric: He has a normal mood and  affect. His behavior is normal.    No results found for this or any previous visit (from the past 24 hour(s)). No results found.  Assessment/Plan: Diagnosis: Recurrent SDH Labs and images independently reviewed.  Records reviewed and summated above.  1. Does the need for close, 24 hr/day medical supervision in concert with the patient's rehab needs make it unreasonable for this patient to be served in a less intensive setting? Potentially  2. Co-Morbidities requiring supervision/potential complications: OSA (monitor for sign/symptoms of daytime somnolence and fatigue), [name] (cont meds), [name], CAF (monitor HR with increased activity),  multiple SDH, tachypnea (monitor RR and O2 Sats with increased physical exertion), steroid induced hyperglycemia (Monitor in accordance with exercise and adjust meds as necessary), hyponatremia (cont to monitor, treat if necessary), ABLA (transfuse if necessary to ensure appropriate perfusion for increased activity tolerance), Thrombocytopenia (< 60,000/mm3 no resistive exercise) 3. Due to safety, skin/wound care, disease management and patient education, does the patient require 24 hr/day rehab nursing? Potentially 4. Does the patient require coordinated care of a physician, rehab nurse, PT (1-2 hrs/day, 5 days/week) and OT (1-2 hrs/day,  5 days/week) to address physical and functional deficits in the context of the above medical diagnosis(es)? Potentially Addressing deficits in the following areas: balance, endurance, locomotion, strength, transferring, bathing, dressing, toileting and psychosocial support 5. Can the patient actively participate in an intensive therapy program of at least 3 hrs of therapy per day at least 5 days per week? Yes 6. The potential for patient to make measurable gains while on inpatient rehab is good 7. Anticipated functional outcomes upon discharge from inpatient rehab are supervision  with PT, supervision with OT, n/a with  SLP. 8. Estimated rehab length of stay to reach the above functional goals is: 6-9 days. 9. Anticipated D/C setting: Home 10. Anticipated post D/C treatments: HH therapy and Home excercise program 11. Overall Rehab/Functional Prognosis: good  RECOMMENDATIONS: This patient's condition is appropriate for continued rehabilitative care in the following setting: Will await PT eval, however, patient appears to be functionally improving.  Anticipate he will at his baseline level of functioning soon and will not require CIR. Patient has agreed to participate in recommended program. Potentially Note that insurance prior authorization may be required for reimbursement for recommended care.  Comment: Rehab Admissions Coordinator to follow up.   I have personally performed a face to face diagnostic evaluation, including, but not limited to relevant history and physical exam findings, of this patient and developed relevant assessment and plan.  Additionally, I have reviewed and concur with the physician assistant's documentation above.   Delice Lesch, MD, ABPMR Bary Leriche, PA-C 03/23/2018

## 2018-03-23 NOTE — Progress Notes (Signed)
Occupational Therapy Treatment Patient Details Name: Ryan Savage MRN: [id number] DOB: May 27, 1929 Today's Date: 03/23/2018    History of present illness pt is an 83 y/o male with pmh significant for stroke, MI, HTN, [name], [name], recurrent SDH's, admitted with c/o of R LE weakness.      CT shows increasing size of L SDH with mass effect.  5/10 pt s/p Left frontoptemporoparietal crani for evacuation of chronic and subacute SDH.   OT comments  Pt progressing towards OT goals, presents supine in bed, reports feeling fatigued today due to not sleeping well but is agreeable to therapy session. Pt requiring MinA (+2) for room and hallway level functional mobility this session using HHA and RW, min cues for maintaining upright posture as pt tends to demonstrate posterior lean with activity progression and with increased fatigue. Pt requiring MinA for toileting and standing grooming ADLs; requires min cues during session for sequencing and initiating tasks. Currently feel pt will benefit from CIR level services to maximize his safety and independence with ADLs and mobility prior to return home. Pending pt progress, may be able to return home with [facility] services. Will continue to follow acutely to progress pt towards established OT goals and to facilitate appropriate d/c plan.    Follow Up Recommendations  CIR;Supervision/Assistance - 24 hour(pending progress, may be able to return home with [facility])    Equipment Recommendations  None recommended by OT          Precautions / Restrictions Precautions Precautions: Fall Restrictions Weight Bearing Restrictions: No       Mobility Bed Mobility Overal bed mobility: Needs Assistance Bed Mobility: Supine to Sit;Sit to Supine     Supine to sit: Min assist Sit to supine: Min guard   General bed mobility comments: MinA to support trunk upright into sitting; close minguard when returning to supine and to assist with lines, pt able to scoot self towards [facility]  with increased time/effort   Transfers Overall transfer level: Needs assistance Equipment used: Rolling walker (2 wheeled);1 person hand held assist Transfers: Sit to/from Stand Sit to Stand: Min assist;+2 safety/equipment         General transfer comment: Steadying assist upon standing; pt completing sit<>stand from EOB and toilet     Balance Overall balance assessment: Needs assistance Sitting-balance support: No upper extremity supported Sitting balance-Leahy Scale: Good Sitting balance - Comments: able to sit EOB with minguard assist while donning sock   Standing balance support: During functional activity;Bilateral upper extremity supported;Single extremity supported Standing balance-Leahy Scale: Fair Standing balance comment: overall requires MinA to maintain standing balance; minA and cues for posture as pt tends to list posteriorly                            ADL either performed or assessed with clinical judgement   ADL Overall ADL's : Needs assistance/impaired     Grooming: Wash/dry hands;Minimal assistance Grooming Details (indicate cue type and reason): MinA for standing balance and safety; min cues for initiating task              Lower Body Dressing: Minimal assistance;Sit to/from stand Lower Body Dressing Details (indicate cue type and reason): MinA for dynamic balance; pt able to don R sock sitting EOB given increased time/effort Toilet Transfer: Minimal assistance;Ambulation;Cueing for safety;+2 for safety/equipment Toilet Transfer Details (indicate cue type and reason): minA for balance and min cues for sequencing turning to sit on toilet  Toileting- Clothing  Manipulation and Hygiene: Minimal assistance;Sit to/from stand Toileting - Clothing Manipulation Details (indicate cue type and reason): MinA for standing balance while pt performs pericare after BM; intermittent minA for gown management     Functional mobility during ADLs: Minimal  assistance;+2 for safety/equipment General ADL Comments: MinA using HHA for room level functional mobility, use of RW and HHA during hallway level functional mobility (+2 for safety throughout), at times requires close to [facility] for dynamic support; min cues for sequencing use of RW; pt tends to demonstrate increased posterior lean during activity progression, requiring MinA and min cues to correct; pt often reaching out/seeking UE support within environment during mobility and functional task completion                       Cognition Arousal/Alertness: Awake/alert Behavior During Therapy: WFL for tasks assessed/performed Overall Cognitive Status: Impaired/Different from baseline Area of Impairment: Attention;Problem solving                   Current Attention Level: Sustained   Following Commands: Follows one step commands with increased time;Follows one step commands consistently     Problem Solving: Decreased initiation;Difficulty sequencing;Requires verbal cues;Requires tactile cues General Comments: pt requires min directional cues for initiating and sequencing through ADL and functional tasks safely                    General Comments pt's spouse present during session     Pertinent Vitals/ Pain       Pain Assessment: No/denies pain                                                          Frequency  Min 2X/week        Progress Toward Goals  OT Goals(current goals can now be found in the care plan section)  Progress towards OT goals: Progressing toward goals  Acute Rehab OT Goals Patient Stated Goal: back home and able to do for myself. OT Goal Formulation: With patient Time For Goal Achievement: 04/04/18 Potential to Achieve Goals: Good  Plan Discharge plan remains appropriate;Other (comment)(pending progress; may benefit from CIR prior to return home)    Co-evaluation    PT/OT/SLP Co-Evaluation/Treatment:  Yes Reason for Co-Treatment: For patient/therapist safety;To address functional/ADL transfers   OT goals addressed during session: ADL's and self-care      AM-PAC PT "6 Clicks" Daily Activity     Outcome Measure   Help from another person eating meals?: None Help from another person taking care of personal grooming?: A Little Help from another person toileting, which includes using toliet, bedpan, or urinal?: A Little Help from another person bathing (including washing, rinsing, drying)?: A Little Help from another person to put on and taking off regular upper body clothing?: A Little Help from another person to put on and taking off regular lower body clothing?: A Lot 6 Click Score: 19    End of Session Equipment Utilized During Treatment: Gait belt;Rolling walker  OT Visit Diagnosis: Unsteadiness on feet (R26.81);Other abnormalities of gait and mobility (R26.89);Muscle weakness (generalized) (M62.81);History of falling (Z91.81);Other symptoms and signs involving cognitive function   Activity Tolerance Patient tolerated treatment well   Patient Left in bed;with call bell/phone within reach;with bed alarm set;with family/visitor present  Nurse Communication Mobility status        Time: 2426-8341 OT Time Calculation (min): 41 min  Charges: OT General Charges $OT Visit: 1 Visit OT Treatments $Self Care/Home Management : 8-22 mins $Therapeutic Activity: 8-22 mins  Lou Cal, OT Pager [phone] 03/23/2018   Ryan Savage 03/23/2018, 1:28 PM

## 2018-03-23 NOTE — Progress Notes (Signed)
Patient ID: Ryan Savage, male   DOB: 1929-06-05, 84 y.o.   MRN: [id number] Vital signs are stable Patient appears to have some minimal confusion Sub-galeal drain was removed Incision appears clean and dry Awaiting repeat CT scan We will ask rehab to see and reevaluate patient

## 2018-03-23 NOTE — Progress Notes (Signed)
2230: Handoff report received from [facility] RN. Pt transferred to bed from wheelchair. Discussed plan of care for the shift; pt amenable to plan.  0000: Pt pleasantly confused and bedding is wet with urine. Bedding changed and condom catheter applied.  0200: Pt attempting to get OOB; transferred to low bed, condom catheter reinforced.  0300: Pt's wife called the unit saying that the pt has called her because he is wet and hasn't "seen anyone for hours." I assured pt's wife that we round at least hourly and told her that we would immediately check in with him. Pt's bed was wet, he was cleaned up, and condom catheter reapplied. I reoriented the pt to the call bell and had him demonstrate how to use the call bell properly.  0700: Handoff report given to RN. No acute events overnight.

## 2018-03-23 NOTE — Progress Notes (Signed)
I met with patient and his wife at bedside. I am familiar with this patient from previous admission to CIR. I will discuss with Rehab MD and follow up tomorrow with our final recommendations. [phone]

## 2018-03-24 MED ORDER — ADULT MULTIVITAMIN W/MINERALS CH
1.0000 | ORAL_TABLET | Freq: Every day | ORAL | Status: DC
  Administered 2018-03-24 – 2018-03-25 (×2): 1 via ORAL
  Filled 2018-03-24 (×2): qty 1

## 2018-03-24 MED ORDER — PNEUMOCOCCAL VAC POLYVALENT 25 MCG/0.5ML IJ INJ: 0.5000 mL | INJECTION | INTRAMUSCULAR | Status: DC

## 2018-03-24 NOTE — Care Management Note (Signed)
Case Management Note  Patient Details  Name: Ryan Savage MRN: [id number] Date of Birth: 06/12/1929  Subjective/Objective:  Pt is an 84 y/o male admitted with c/o of R LE weakness; CT shows increasing size of L SDH with mass effect.  Pt s/p craniotomy for evacuation of chronic and subacute SDH on 03/20/18.  PTA, pt needs assistance with ADLS; lives with wife.  He is active with [facility], [place], and OT.                    Action/Plan: PT/OT recommending CIR, and insurance authorization in process.  Will follow progress.    Expected Discharge Date:                  Expected Discharge Plan:  [place]  In-House Referral:     Discharge planning Services  CM Consult  Post Acute Care Choice:    Choice offered to:     DME Arranged:    DME Agency:     HH Arranged:    [place] Agency:     Status of Service:  In process, will continue to follow  If discussed at Long Length of Stay Meetings, dates discussed:    Additional Comments:  Reinaldo Raddle, RN, BSN  Trauma/Neuro ICU Case Manager [phone]

## 2018-03-24 NOTE — Progress Notes (Signed)
NEUROSURGERY PROGRESS NOTE  Doing well. Denies any headache N or V Good strength and sensation Incision CDI  Temp:  [97.3 F (36.3 C)-98.1 F (36.7 C)] 97.8 F (36.6 C) (05/14 0300) Pulse Rate:  [86-95] 88 (05/14 0300) Resp:  [13-22] 21 (05/14 0300) BP: (113-130)/(67-94) 127/80 (05/14 0300) SpO2:  [95 %-98 %] 95 % (05/14 0300)  Plan: Await rehab eval. No new nsgy recom   Eleonore Chiquito, NP 03/24/2018 5:47 AM

## 2018-03-24 NOTE — Progress Notes (Signed)
I met with patient at bedside and then contacted his wife by phone to discuss their decision concerning possible inpt rehab admit vs home. Wife to come in this morning to discuss with her husband and then I will meet them at around 1130 to determine the rehab options. RN CM and SW made aware. [phone]

## 2018-03-24 NOTE — Progress Notes (Signed)
Patient ID: Ryan Savage, male   DOB: 09-Mar-1929, 84 y.o.   MRN: [id number] Patient continues to show slow resolution of residual symptoms CT scan yesterday demonstrates moderate quantity of air with slightly left shift and was noted previously Continue supportive care and monitoring of CT scan

## 2018-03-24 NOTE — Progress Notes (Signed)
I met with patient and spouse at bedside. They would like to pursue insurance approval for an inpt rehab admit. I will begin authorization and am hopeful for a decision by tomorrow. I have updated RN CM and SW. [phone]

## 2018-03-25 ENCOUNTER — Other Ambulatory Visit: Payer: Self-pay

## 2018-03-25 ENCOUNTER — Encounter (HOSPITAL_COMMUNITY): Payer: Self-pay | Admitting: Nurse Practitioner

## 2018-03-25 ENCOUNTER — Inpatient Hospital Stay (HOSPITAL_COMMUNITY)
Admission: RE | Admit: 2018-03-25 | Discharge: 2018-04-04 | DRG: 057 | Disposition: A | Discharge status: Home Health | Payer: Medicare Other | Source: Intra-hospital | Attending: Physical Medicine & Rehabilitation | Admitting: Physical Medicine & Rehabilitation

## 2018-03-25 DIAGNOSIS — Z298 Encounter for other specified prophylactic measures: Secondary | ICD-10-CM

## 2018-03-25 DIAGNOSIS — G4700 Insomnia, unspecified: Secondary | ICD-10-CM | POA: Diagnosis present

## 2018-03-25 DIAGNOSIS — I480 Paroxysmal atrial fibrillation: Secondary | ICD-10-CM | POA: Diagnosis present

## 2018-03-25 DIAGNOSIS — E785 Hyperlipidemia, unspecified: Secondary | ICD-10-CM | POA: Diagnosis present

## 2018-03-25 DIAGNOSIS — K219 Gastro-esophageal reflux disease without esophagitis: Secondary | ICD-10-CM | POA: Diagnosis present

## 2018-03-25 DIAGNOSIS — I110 Hypertensive heart disease with heart failure: Secondary | ICD-10-CM | POA: Diagnosis present

## 2018-03-25 DIAGNOSIS — E222 Syndrome of inappropriate secretion of antidiuretic hormone: Secondary | ICD-10-CM | POA: Diagnosis present

## 2018-03-25 DIAGNOSIS — Z79899 Other long term (current) drug therapy: Secondary | ICD-10-CM

## 2018-03-25 DIAGNOSIS — I5022 Chronic systolic (congestive) heart failure: Secondary | ICD-10-CM | POA: Diagnosis present

## 2018-03-25 DIAGNOSIS — G9389 Other specified disorders of brain: Secondary | ICD-10-CM | POA: Diagnosis present

## 2018-03-25 DIAGNOSIS — G629 Polyneuropathy, unspecified: Secondary | ICD-10-CM | POA: Diagnosis present

## 2018-03-25 DIAGNOSIS — I6200 Nontraumatic subdural hemorrhage, unspecified: Secondary | ICD-10-CM | POA: Diagnosis not present

## 2018-03-25 DIAGNOSIS — I10 Essential (primary) hypertension: Secondary | ICD-10-CM

## 2018-03-25 DIAGNOSIS — Z800 Family history of malignant neoplasm of digestive organs: Secondary | ICD-10-CM

## 2018-03-25 DIAGNOSIS — Z9181 History of falling: Secondary | ICD-10-CM

## 2018-03-25 DIAGNOSIS — F419 Anxiety disorder, unspecified: Secondary | ICD-10-CM | POA: Diagnosis present

## 2018-03-25 DIAGNOSIS — Z09 Encounter for follow-up examination after completed treatment for conditions other than malignant neoplasm: Secondary | ICD-10-CM

## 2018-03-25 DIAGNOSIS — N400 Enlarged prostate without lower urinary tract symptoms: Secondary | ICD-10-CM | POA: Diagnosis present

## 2018-03-25 DIAGNOSIS — Z8249 Family history of ischemic heart disease and other diseases of the circulatory system: Secondary | ICD-10-CM

## 2018-03-25 DIAGNOSIS — G4733 Obstructive sleep apnea (adult) (pediatric): Secondary | ICD-10-CM | POA: Diagnosis present

## 2018-03-25 DIAGNOSIS — D62 Acute posthemorrhagic anemia: Secondary | ICD-10-CM | POA: Diagnosis present

## 2018-03-25 DIAGNOSIS — Z85828 Personal history of other malignant neoplasm of skin: Secondary | ICD-10-CM | POA: Diagnosis not present

## 2018-03-25 DIAGNOSIS — Z87891 Personal history of nicotine dependence: Secondary | ICD-10-CM

## 2018-03-25 DIAGNOSIS — E871 Hypo-osmolality and hyponatremia: Secondary | ICD-10-CM | POA: Diagnosis present

## 2018-03-25 DIAGNOSIS — I2510 Atherosclerotic heart disease of native coronary artery without angina pectoris: Secondary | ICD-10-CM | POA: Diagnosis present

## 2018-03-25 DIAGNOSIS — G4701 Insomnia due to medical condition: Secondary | ICD-10-CM | POA: Diagnosis not present

## 2018-03-25 DIAGNOSIS — I482 Chronic atrial fibrillation: Secondary | ICD-10-CM | POA: Diagnosis present

## 2018-03-25 DIAGNOSIS — I255 Ischemic cardiomyopathy: Secondary | ICD-10-CM | POA: Diagnosis present

## 2018-03-25 DIAGNOSIS — I69151 Hemiplegia and hemiparesis following nontraumatic intracerebral hemorrhage affecting right dominant side: Principal | ICD-10-CM

## 2018-03-25 DIAGNOSIS — I341 Nonrheumatic mitral (valve) prolapse: Secondary | ICD-10-CM | POA: Diagnosis present

## 2018-03-25 DIAGNOSIS — S065X0D Traumatic subdural hemorrhage without loss of consciousness, subsequent encounter: Secondary | ICD-10-CM

## 2018-03-25 DIAGNOSIS — D696 Thrombocytopenia, unspecified: Secondary | ICD-10-CM | POA: Diagnosis present

## 2018-03-25 DIAGNOSIS — Z888 Allergy status to other drugs, medicaments and biological substances status: Secondary | ICD-10-CM

## 2018-03-25 DIAGNOSIS — L299 Pruritus, unspecified: Secondary | ICD-10-CM | POA: Diagnosis not present

## 2018-03-25 DIAGNOSIS — S069X3S Unspecified intracranial injury with loss of consciousness of 1 hour to 5 hours 59 minutes, sequela: Secondary | ICD-10-CM | POA: Diagnosis not present

## 2018-03-25 DIAGNOSIS — S065XAA Traumatic subdural hemorrhage with loss of consciousness status unknown, initial encounter: Secondary | ICD-10-CM

## 2018-03-25 DIAGNOSIS — Z9889 Other specified postprocedural states: Secondary | ICD-10-CM | POA: Diagnosis not present

## 2018-03-25 DIAGNOSIS — I69293 Ataxia following other nontraumatic intracranial hemorrhage: Secondary | ICD-10-CM

## 2018-03-25 DIAGNOSIS — Z8679 Personal history of other diseases of the circulatory system: Secondary | ICD-10-CM

## 2018-03-25 DIAGNOSIS — I69222 Dysarthria following other nontraumatic intracranial hemorrhage: Secondary | ICD-10-CM | POA: Diagnosis not present

## 2018-03-25 DIAGNOSIS — I252 Old myocardial infarction: Secondary | ICD-10-CM | POA: Diagnosis not present

## 2018-03-25 DIAGNOSIS — S065X9A Traumatic subdural hemorrhage with loss of consciousness of unspecified duration, initial encounter: Secondary | ICD-10-CM

## 2018-03-25 DIAGNOSIS — R296 Repeated falls: Secondary | ICD-10-CM | POA: Diagnosis present

## 2018-03-25 DIAGNOSIS — G8191 Hemiplegia, unspecified affecting right dominant side: Secondary | ICD-10-CM | POA: Diagnosis not present

## 2018-03-25 DIAGNOSIS — Z2989 Encounter for other specified prophylactic measures: Secondary | ICD-10-CM

## 2018-03-25 MED ORDER — FLUTICASONE PROPIONATE 50 MCG/ACT NA SUSP
1.0000 | Freq: Every day | NASAL | Status: DC
  Administered 2018-03-26 – 2018-04-04 (×10): 1 via NASAL
  Filled 2018-03-25: qty 16

## 2018-03-25 MED ORDER — ACETAMINOPHEN 325 MG PO TABS: 325.0000 mg | ORAL_TABLET | ORAL | Status: DC | PRN

## 2018-03-25 MED ORDER — DIPHENHYDRAMINE HCL 12.5 MG/5ML PO ELIX: 12.5000 mg | ORAL_SOLUTION | Freq: Four times a day (QID) | ORAL | Status: DC | PRN

## 2018-03-25 MED ORDER — DIGOXIN 125 MCG PO TABS
0.1250 mg | ORAL_TABLET | Freq: Every day | ORAL | Status: DC
  Administered 2018-03-26 – 2018-04-04 (×10): 0.125 mg via ORAL
  Filled 2018-03-25 (×10): qty 1

## 2018-03-25 MED ORDER — ADULT MULTIVITAMIN W/MINERALS CH
1.0000 | ORAL_TABLET | Freq: Every day | ORAL | Status: DC
  Administered 2018-03-26 – 2018-04-04 (×10): 1 via ORAL
  Filled 2018-03-25 (×10): qty 1

## 2018-03-25 MED ORDER — GUAIFENESIN-DM 100-10 MG/5ML PO SYRP: 5.0000 mL | ORAL_SOLUTION | Freq: Four times a day (QID) | ORAL | Status: DC | PRN

## 2018-03-25 MED ORDER — BISACODYL 10 MG RE SUPP: 10.0000 mg | Freq: Every day | RECTAL | Status: DC | PRN

## 2018-03-25 MED ORDER — FAMOTIDINE 20 MG PO TABS
20.0000 mg | ORAL_TABLET | Freq: Two times a day (BID) | ORAL | Status: DC
  Administered 2018-03-25 – 2018-04-04 (×20): 20 mg via ORAL
  Filled 2018-03-25 (×20): qty 1

## 2018-03-25 MED ORDER — ONDANSETRON HCL 4 MG/2ML IJ SOLN: 4.0000 mg | INTRAMUSCULAR | Status: DC | PRN

## 2018-03-25 MED ORDER — ALUM & MAG HYDROXIDE-SIMETH 200-200-20 MG/5ML PO SUSP: 30.0000 mL | ORAL | Status: DC | PRN

## 2018-03-25 MED ORDER — DOCUSATE SODIUM 100 MG PO CAPS
100.0000 mg | ORAL_CAPSULE | Freq: Two times a day (BID) | ORAL | Status: DC
  Administered 2018-03-25 – 2018-03-30 (×9): 100 mg via ORAL
  Filled 2018-03-25 (×10): qty 1

## 2018-03-25 MED ORDER — TRAZODONE HCL 50 MG PO TABS: 25.0000 mg | ORAL_TABLET | Freq: Every evening | ORAL | Status: DC | PRN

## 2018-03-25 MED ORDER — HYDROCODONE-ACETAMINOPHEN 5-325 MG PO TABS: 1.0000 | ORAL_TABLET | ORAL | Status: DC | PRN

## 2018-03-25 MED ORDER — POLYETHYLENE GLYCOL 3350 17 G PO PACK
17.0000 g | PACK | Freq: Every day | ORAL | Status: DC | PRN
  Administered 2018-03-27: 17 g via ORAL
  Filled 2018-03-25: qty 1

## 2018-03-25 MED ORDER — FLEET ENEMA 7-19 GM/118ML RE ENEM: 1.0000 | ENEMA | Freq: Once | RECTAL | Status: DC | PRN

## 2018-03-25 MED ORDER — GABAPENTIN 300 MG PO CAPS
300.0000 mg | ORAL_CAPSULE | Freq: Every day | ORAL | Status: DC
  Administered 2018-03-25 – 2018-04-03 (×10): 300 mg via ORAL
  Filled 2018-03-25 (×10): qty 1

## 2018-03-25 MED ORDER — ONDANSETRON HCL 4 MG PO TABS: 4.0000 mg | ORAL_TABLET | ORAL | Status: DC | PRN

## 2018-03-25 MED ORDER — METOPROLOL SUCCINATE ER 25 MG PO TB24
25.0000 mg | ORAL_TABLET | Freq: Every day | ORAL | Status: DC
  Administered 2018-03-26 – 2018-04-04 (×10): 25 mg via ORAL
  Filled 2018-03-25 (×10): qty 1

## 2018-03-25 MED ORDER — LEVETIRACETAM 500 MG PO TABS
500.0000 mg | ORAL_TABLET | Freq: Two times a day (BID) | ORAL | Status: DC
  Administered 2018-03-25 – 2018-04-04 (×20): 500 mg via ORAL
  Filled 2018-03-25 (×20): qty 1

## 2018-03-25 MED ORDER — ENSURE ENLIVE PO LIQD: 237.0000 mL | Freq: Two times a day (BID) | ORAL | Status: DC

## 2018-03-25 MED ORDER — SENNA 8.6 MG PO TABS
1.0000 | ORAL_TABLET | Freq: Two times a day (BID) | ORAL | Status: DC
  Administered 2018-03-25 – 2018-03-30 (×10): 8.6 mg via ORAL
  Filled 2018-03-25 (×10): qty 1

## 2018-03-25 MED ORDER — FINASTERIDE 5 MG PO TABS
5.0000 mg | ORAL_TABLET | Freq: Every day | ORAL | Status: DC
  Administered 2018-03-26 – 2018-04-04 (×10): 5 mg via ORAL
  Filled 2018-03-25 (×10): qty 1

## 2018-03-25 MED ORDER — ATORVASTATIN CALCIUM 20 MG PO TABS
20.0000 mg | ORAL_TABLET | Freq: Every evening | ORAL | Status: DC
  Administered 2018-03-25 – 2018-04-03 (×10): 20 mg via ORAL
  Filled 2018-03-25 (×10): qty 1

## 2018-03-25 MED ORDER — TRAZODONE HCL 50 MG PO TABS
50.0000 mg | ORAL_TABLET | Freq: Every day | ORAL | Status: DC
  Administered 2018-03-25 – 2018-03-31 (×7): 50 mg via ORAL
  Filled 2018-03-25 (×7): qty 1

## 2018-03-25 NOTE — Progress Notes (Signed)
Patient ID: Ryan Savage, male   DOB: [birth year], 84 y.o.   MRN: [id number] Patient admitted to [phone] via wheelchair, escorted by nursing staff and spouse.  Patient and spouse verbalized understanding of rehab process, as was just here less than a month ago.  Patient settled in to recliner with call bell within reach.  Brita Romp, RN

## 2018-03-25 NOTE — PMR Pre-admission (Signed)
PMR Admission Coordinator Pre-Admission Assessment  Patient: Ryan Savage is an 84 y.o., male MRN: [id number] DOB: 1929/01/13 Height: 5\' 10"  (177.8 cm) Weight: 79.4 kg (175 lb 0.7 oz)              Insurance Information HMO:     PPO: yes     PCP:      IPA:      80/20:      OTHER: medicare advantage plan PRIMARY: [place]      Policy#: [id number]      Subscriber: pt CM Name: Orvan July      Phone#: [phone]     Fax#: [phone] Pre-Cert#: [id number]   Approved for 7 days with f/u with Vevelyn Royals phone [phone] fax [phone]   Employer: retired Benefits:  Phone #: [phone]     Name: 03/24/2018 Eff. Date: 11/11/2017     Deduct: none      Out of Pocket Max: $4000      Life Max: none CIR: $160 co pay per day days 1 until 10      SNF: no co pay days 1 to 20; $50 co pay per day days 21 to 100 Outpatient: $20 co pay per visit     Co-Pay: visits per medical neccesity Home Health: 100%      Co-Pay: visits per medical neccesity DME: 80%     Co-Pay: 20% Providers: in network  SECONDARY: none       Medicaid Application Date:       Case Manager:  Disability Application Date:       Case Worker:   Emergency Facilities manager Information    Name Relation Home Work Mobile   [place] Spouse [phone]  [phone]   Sophia, Cubero   [phone]     Current Medical History  Patient Admitting Diagnosis: recurrent SDH  History of Present Illness:  HPI: Ryan Savage is a 84 y.o. male with history of OSA, [name], [name],  [name], multiple SDH s/p evacuation on 01/25/18 and 02/19/18 with recent rehab stay and was discharged to home on 4/24 with [facility] therapy.  He was readmitted on 03/19/18 with right sided weakness and fall. CT head reviewed, showing left SDH.  Per report, re accumulation of left SDH with mass effect and early rightward subfalcine herniation. He underwent emergent left crani for evacuation of bleed by  Dr. Ellene Route.  Post op, he has had mild confusion, delayed processing as well as balance deficits with retropulsion with standing/ambulation.    Repeat CT demonstrated moderate quantity of air with slight left shift that was noted previously. Dr. Ellene Route plans continued supportive care and monitoring of CT scans.    Past Medical History  Past Medical History:  Diagnosis Date  . Atrial fibrillation, chronic (HCC)    Not on anticoagulation due to history of bilateral subdural bleeds due to recurrent falls  . BPH (benign prostatic hypertrophy)   . [name] (coronary artery disease), native coronary artery    Severe two-vessel [name] with chronically occluded LAD and RCA with widely patent left main, intermediate, and left circumflex branches.  On medical management.  He has extensive collaterals.  . 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 fib with RVR  . [name] (degenerative joint disease)   . GERD (gastroesophageal reflux disease)   .  Hyperlipidemia    LDL goal less then 100  . Hypertension   . Microscopic hematuria    [name]  . Mitral valve prolapse    with moderate MR  . Myocardial infarction [facility])    "was told 11/2017 that he'd had a heart attack; don't know when it was" (02/19/2018)  . OSA (obstructive sleep apnea)    intolerant with CPAP (02/19/2018)  . SDH (subdural hematoma) ([place]) 01/2018  . SIADH (syndrome of inappropriate ADH production) ([place])   . Skin cancer    "side of my nose" (11/19/2017)  . Stroke ([place]) 12/2017   "fully recovered"  . Thrombocytopenia (HCC)    Mild- platelet count 143,000 on 02/2011, stable 08/2011    Family History  family history includes [name] in his brother and father; Colon cancer in his brother and sister; Heart attack in his brother and father; Heart failure in his mother.  Prior Rehab/Hospitalizations:   Has the patient had major surgery during 100 days prior to admission? Yes  CIR d/c 03/05/2018 after 9 days. [facility] with [facility] with PT, OT, and SLP. Overall supervision level  Current Medications   Current Facility-Administered Medications:  .  atorvastatin (LIPITOR) tablet 20 mg, 20 mg, Oral, QPM, Eustace Moore, MD, 20 mg at 03/24/18 1805 .  bisacodyl (DULCOLAX) suppository 10 mg, 10 mg, Rectal, Daily PRN, Eustace Moore, MD .  digoxin Fonnie Birkenhead) tablet 0.125 mg, 0.125 mg, Oral, Daily, Eustace Moore, MD, 0.125 mg at 03/25/18 0908 .  docusate sodium (COLACE) capsule 100 mg, 100 mg, Oral, BID, Eustace Moore, MD, 100 mg at 03/25/18 0908 .  famotidine (PEPCID) tablet 20 mg, 20 mg, Oral, BID, Eustace Moore, MD, 20 mg at 03/25/18 0908 .  feeding supplement (ENSURE ENLIVE) (ENSURE ENLIVE) liquid 237 mL, 237 mL, Oral, TID BM, Eustace Moore, MD, 237 mL at 03/25/18 0910 .  finasteride (PROSCAR) tablet 5 mg, 5 mg, Oral, Daily, Eustace Moore, MD, 5 mg at 03/25/18 0908 .  fluticasone (FLONASE) 50 MCG/ACT nasal spray 1 spray, 1 spray, Each Nare, Daily, Eustace Moore, MD, 1 spray at 03/25/18 0920 .  gabapentin (NEURONTIN) capsule 300 mg, 300 mg, Oral, QHS, Eustace Moore, MD, 300 mg at 03/24/18 2113 .  HYDROcodone-acetaminophen (NORCO/VICODIN) 5-325 MG per tablet 1 tablet, 1 tablet, Oral, Q4H PRN, Eustace Moore, MD, 1 tablet at 03/21/18 2114 .  labetalol (NORMODYNE,TRANDATE) injection 10-40 mg, 10-40 mg, Intravenous, Q10 min PRN, Eustace Moore, MD .  lactated ringers infusion, , Intravenous, Continuous, Eustace Moore, MD, Last Rate: 75 mL/hr at 03/25/18 0355 .  levETIRAcetam (KEPPRA) IVPB 500 mg/100 mL premix, 500 mg, Intravenous, BID, Eustace Moore, MD, Stopped at 03/25/18 [phone] .  metoprolol succinate (TOPROL-XL) 24 hr tablet 25 mg, 25 mg, Oral, Daily, Eustace Moore, MD, 25 mg at 03/25/18 0908 .  morphine 4 MG/ML injection 2 mg, 2 mg, Intravenous, Q2H PRN, Eustace Moore, MD .   multivitamin with minerals tablet 1 tablet, 1 tablet, Oral, Daily, Kristeen Miss, MD, 1 tablet at 03/25/18 0908 .  ondansetron (ZOFRAN) tablet 4 mg, 4 mg, Oral, Q4H PRN **OR** ondansetron (ZOFRAN) injection 4 mg, 4 mg, Intravenous, Q4H PRN, Eustace Moore, MD .  polyethylene glycol (MIRALAX / GLYCOLAX) packet 17 g, 17 g, Oral, Daily PRN, Eustace Moore, MD .  promethazine (PHENERGAN) tablet 12.5-25 mg, 12.5-25 mg, Oral, Q4H PRN, Eustace Moore, MD .  senna [facility]) tablet 8.6 mg,  1 tablet, Oral, BID, Eustace Moore, MD, 8.6 mg at 03/25/18 0908 .  sodium phosphate (FLEET) 7-19 GM/118ML enema 1 enema, 1 enema, Rectal, Once PRN, Eustace Moore, MD .  traZODone (DESYREL) tablet 25-50 mg, 25-50 mg, Oral, QHS PRN, Eustace Moore, MD, 25 mg at 03/24/18 2114  Patients Current Diet:  Diet Order           Diet regular Room service appropriate? Yes; Fluid consistency: Thin  Diet effective now          Precautions / Restrictions Precautions Precautions: Fall Restrictions Weight Bearing Restrictions: No   Has the patient had 2 or more falls or a fall with injury in the past year?Yes  Prior Activity Level Limited Community (1-2x/wk): gradual decline in function since January due to multiple medical issues and admits Community (5-7x/wk): was driving and not using AD prior to January  Home Assistive Devices / [place] Devices/Equipment: None Home Equipment: Environmental consultant - standard, Bedside commode, Grab bars - tub/shower, Shower seat  Prior Device Use: Indicate devices/aids used by the patient prior to current illness, exacerbation or injury? [place]  Prior Functional Level Prior Function Level of [place]: Needs assistance Gait / Transfers Assistance Needed: Post CIR pt reports that he was able to walk without an AD in the home ADL's / Homemaking Assistance Needed: Post CIR, pt performing BADLs with supervision Comments: Pt reports multiple falls and multiple admissions since  January   Self Care: Did the patient need help bathing, dressing, using the toilet or eating?  Needed some help  Indoor Mobility: Did the patient need assistance with walking from room to room (with or without device)? Needed some help  Stairs: Did the patient need assistance with internal or external stairs (with or without device)? Needed some help  Functional Cognition: Did the patient need help planning regular tasks such as shopping or remembering to take medications? Needed some help  Current Functional Level Cognition  Arousal/Alertness: Awake/alert Overall Cognitive Status: Impaired/Different from baseline Current Attention Level: Sustained Orientation Level: Oriented X4 Following Commands: Follows one step commands consistently, Follows one step commands with increased time General Comments: pt requires min directional cues for initiating and sequencing through ADL and functional tasks safely Attention: Selective    Extremity Assessment (includes Sensation/Coordination)  Upper Extremity Assessment: Overall WFL for tasks assessed, RUE deficits/detail RUE Deficits / Details: Slight weakness in RUE compared to left. WFL for grooming tasks  Lower Extremity Assessment: Defer to PT evaluation    ADLs  Overall ADL's : Needs assistance/impaired Eating/Feeding: Set up, Sitting Grooming: Wash/dry hands, Minimal assistance Grooming Details (indicate cue type and reason): MinA for standing balance and safety; min cues for initiating task  Upper Body Bathing: Set up, Supervision/ safety, Sitting Lower Body Bathing: Minimal assistance, Sit to/from stand Lower Body Bathing Details (indicate cue type and reason): Min A for dynamic balance Upper Body Dressing : Set up, Sitting, Supervision/safety Lower Body Dressing: Minimal assistance, Sit to/from stand Lower Body Dressing Details (indicate cue type and reason): MinA for dynamic balance; pt able to don R sock sitting EOB given increased  time/effort Toilet Transfer: Minimal assistance, Ambulation, Cueing for safety, +2 for safety/equipment Toilet Transfer Details (indicate cue type and reason): minA for balance and min cues for sequencing turning to sit on toilet  Toileting- Clothing Manipulation and Hygiene: Minimal assistance, Sit to/from stand Toileting - Clothing Manipulation Details (indicate cue type and reason): MinA for standing balance while pt performs pericare after BM; intermittent  minA for gown management Functional mobility during ADLs: Minimal assistance, +2 for safety/equipment General ADL Comments: MinA using HHA for room level functional mobility, use of RW and HHA during hallway level functional mobility (+2 for safety throughout), at times requires close to [facility] for dynamic support; min cues for sequencing use of RW; pt tends to demonstrate increased posterior lean during activity progression, requiring MinA and min cues to correct; pt often reaching out/seeking UE support within environment during mobility and functional task completion    Mobility  Overal bed mobility: Needs Assistance Bed Mobility: Supine to Sit, Sit to Supine Supine to sit: Min assist Sit to supine: Min assist General bed mobility comments: MinA to support trunk upright into sitting; close minguard when returning to supine and to assist with lines, pt able to scoot self towards [facility] with increased time/effort     Transfers  Overall transfer level: Needs assistance Equipment used: Rolling [place] (2 wheeled) Transfers: Sit to/from Stand Sit to Stand: Min assist, +2 safety/equipment General transfer comment: Steadying assist upon standing; pt completing sit<>stand from EOB and toilet     Ambulation / Gait / Stairs / Wheelchair Mobility  Ambulation/Gait Ambulation/Gait assistance: Min assist(episodes of mod assist with more retropulsion and fatigue) Ambulation Distance (Feet): 130 Feet Assistive device: Rolling [place] (2 wheeled) Gait  Pattern/deviations: Step-through pattern General Gait Details: retropulsive in nature, mildly unsteady Gait velocity: decreased    Posture / Balance Dynamic Sitting Balance Sitting balance - Comments: able to sit EOB with minguard assist while donning sock Balance Overall balance assessment: Needs assistance Sitting-balance support: No upper extremity supported Sitting balance-Leahy Scale: Good Sitting balance - Comments: able to sit EOB with minguard assist while donning sock Standing balance support: During functional activity, Bilateral upper extremity supported, Single extremity supported Standing balance-Leahy Scale: Fair Standing balance comment: overall requires MinA to maintain standing balance; minA and cues for posture as pt tends to list posteriorly     Special needs/care consideration BiPAP/CPAP  N/a CPM  N/a Continuous Drip IV  N/a Dialysis  N/a Life Vest  N/a Oxygen  N/a Special Bed  N/a Trach Size  N/a Wound Vac n/a Skin head surgical incision with staples, ecchymosis to BUE; rash bilateral groin areas Bowel mgmt: some incontinence Bladder mgmt: external catheter Diabetic mgmt n/a Contact precautions for MRSA   Previous Home Environment Living Arrangements: Spouse/significant other  Lives With: Spouse Available Help at Discharge: Family, Available 24 hours/day Type of Home: House Home Layout: One level Home Access: Stairs to enter Entrance Stairs-Rails: Right, Left Entrance Stairs-Number of Steps: 3+3 Bathroom Shower/Tub: Gaffer, Curtain, Chiropodist: Standard Bathroom Accessibility: Yes How Accessible: Accessible via [place] Home Care Services: No Type of Home Care Services: Home PT, Home RN [place] (if known): [facility] Additional Comments: recent admits each month since January  Discharge Living Setting Plans for Discharge Living Setting: Patient's home, Lives with (comment) Type of Home at Discharge:  House Discharge Home Layout: One level Discharge Home Access: Stairs to enter Entrance Stairs-Rails: Right Entrance Stairs-Number of Steps: 3 plus 3 Discharge Bathroom Shower/Tub: Walk-in shower, Curtain Discharge Bathroom Toilet: Standard Discharge Bathroom Accessibility: Yes How Accessible: Accessible via [place] Does the patient have any problems obtaining your medications?: No  Social/Family/Support Systems Patient Roles: Spouse, Parent Contact Information: [place] Derry, wife Anticipated Caregiver: Wife Anticipated Caregiver's Contact Information: see above Ability/Limitations of Caregiver: Wife is frail and can only provide supervision level. Cal-son is involved and will check in on daily Caregiver  Availability: 24/7 Discharge Plan Discussed with Primary Caregiver: Yes Is Caregiver In Agreement with Plan?: Yes Does Caregiver/Family have Issues with Lodging/Transportation while Pt is in Rehab?: No   Goals/Additional Needs Patient/Family Goal for Rehab: Mod I to supervision with PT, OT and SLP Expected length of stay: ELOS 7 to 10 days Pt/Family Agrees to Admission and willing to participate: Yes Program Orientation Provided & Reviewed with Pt/Caregiver Including Roles  & Responsibilities: Yes  Barriers to Discharge: Medical stability   Decrease burden of Care through IP rehab admission: n/a  Possible need for SNF placement upon discharge: n/a   Patient Condition: This patient's medical and functional status has changed since the consult dated 03/23/2018 in which the Rehabilitation Physician determined and documented that the patient was potentially appropriate for intensive rehabilitative care in an inpatient rehabilitation facility. Issues have been addressed and update has been discussed with Dr. Posey Pronto and patient now appropriate for inpatient rehabilitation. Will admit to inpatient rehab today.   Preadmission Screen Completed By:  Cleatrice Burke, 03/25/2018 1:54  PM ______________________________________________________________________   Discussed status with Dr. Posey Pronto on 03/25/2018 at  1420 and received telephone approval for admission today.  Admission Coordinator:  Cleatrice Burke, time 1973 Date 03/25/2018

## 2018-03-25 NOTE — Progress Notes (Signed)
I have insurance approval and bed to admit pt to inpt rehab today. I met with pt and his wife at bedside and they are in agreement.. I contacted Dr. Elsner by phone for d/c order. I have alerted RN CM, Julie, and Isabel, SW of plan for CIR. I will make the arrangements to admit today. [phone] 

## 2018-03-25 NOTE — Progress Notes (Signed)
Nutrition Follow-up  DOCUMENTATION CODES:   Severe malnutrition in context of chronic illness  INTERVENTION:  Provide Ensure Enlive po BID, each supplement provides 350 kcal and 20 grams of protein.  Encourage adequate PO intake.   NUTRITION DIAGNOSIS:   Severe Malnutrition related to chronic illness as evidenced by percent weight loss, severe muscle depletion, energy intake < or equal to 75% for > or equal to 1 month(11% <1 month); ongoing  GOAL:   Patient will meet greater than or equal to 90% of their needs; met  MONITOR:   PO intake, Supplement acceptance, Weight trends, Labs, I & O's, Skin  REASON FOR ASSESSMENT:   Malnutrition Screening Tool    ASSESSMENT:   84 y.o. M admitted on 03/19/18 for recurrent subdural hematoma recently discharged from rehab. PSH subdural hematoma surgeries - burr hole (March and April 2019). Pt with weakness on right side extremities. Pt s/p craniotomy 03/20/18 am. PMH of htn, a fib., BPH, [name], hyperlipidemia, microscopic hematuria, mitral valve prolapse, SIADH, CHF, [name], SDH (01/2018), hx of MI and stroke, hx of skin cancer.  Procedure (5/10): Craniotomy for subdural hematoma (Left Head)  Meal completion has been 75%. Pt reports having a good appetite with no other difficulties. Pt currently has Ensure ordered and has been consuming them. As intake has been good, will decrease Ensure order to BID. Plans to discharge to CIR.    Diet Order:   Diet Order           Diet regular Room service appropriate? Yes; Fluid consistency: Thin  Diet effective now          EDUCATION NEEDS:   Education needs have been addressed  Skin:  Skin Assessment: Skin Integrity Issues: Skin Integrity Issues:: Incisions Stage I:   Incisions: head  Last BM:  5/15  Height:   Ht Readings from Last 1 Encounters:  03/20/18 5' 10" (1.778 m)    Weight:   Wt Readings from Last 1 Encounters:  03/22/18 175 lb 0.7 oz (79.4 kg)    Ideal Body Weight:  75.45  kg  BMI:  Body mass index is 25.12 kg/m.  Estimated Nutritional Needs:   Kcal:  1850-2000  Protein:  85-95 grams  Fluid:  >/= 1.8 L/day    Corrin Parker, MS, RD, LDN Pager # [phone] After hours/ weekend pager # [phone]

## 2018-03-25 NOTE — H&P (Signed)
Physical Medicine and Rehabilitation Admission H&P    Chief Complaint  Patient presents with  . Recurrent SDH  . Gait disorder with neuropathy.     HPI: Ryan Savage is an 84 year old male with history of A fib, [name], chronic systolic CHF, hyponatremia, subdural hemorrhage with recurrence and craniotomy x2 most recent February 19, 2018. He  was readmitted via ED on 03/19/2018 with right-sided weakness and falls.  Follow-up CT head showed reaccumulation of left subdural hemorrhage with left right shift and subfalcine herniation.  He was taken back to the OR for left craniotomy for evacuation of subdural hemorrhage by Dr. Ellene Route.   Follow-up CT head 5/13 reviewed, showing stable/slight improvement.  Per report, large amount of pneumocephalus, decrease in tension effect on left frontal pole and persistent right sub-falcine translation-- slightly improved.  Patient is neurologically stable and is showing improvement in activity.  MD recommended  CIR due to functional deficits.   Review of Systems  Constitutional: Negative for chills and fever.  HENT: Positive for hearing loss. Negative for tinnitus.   Respiratory: Positive for cough. Negative for shortness of breath and wheezing.   Cardiovascular: Negative for chest pain and palpitations.  Gastrointestinal: Negative for heartburn and nausea.  Genitourinary: Positive for urgency. Negative for dysuria.  Musculoskeletal: Negative for back pain and myalgias.  Skin: Negative for itching and rash.  Neurological: Positive for sensory change (chronic numbness bilateral feet). Negative for dizziness and headaches.  Psychiatric/Behavioral: Positive for memory loss. Negative for depression and suicidal ideas. The patient is nervous/anxious and has insomnia.   All other systems reviewed and are negative.    Past Medical History:  Diagnosis Date  . Atrial fibrillation, chronic (HCC)    Not on anticoagulation due to history of bilateral subdural bleeds  due to recurrent falls  . BPH (benign prostatic hypertrophy)   . [name] (coronary artery disease), native coronary artery    Severe two-vessel [name] with chronically occluded LAD and RCA with widely patent left main, intermediate, and left circumflex branches.  On medical management.  He has extensive collaterals.  . 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 fib with RVR  . [name] (degenerative joint disease)   . GERD (gastroesophageal reflux disease)   . Hyperlipidemia    LDL goal less then 100  . Hypertension   . Microscopic hematuria    [name]  . Mitral valve prolapse    with moderate MR  . Myocardial infarction [facility])    "was told 11/2017 that he'd had a heart attack; don't know when it was" (02/19/2018)  . OSA (obstructive sleep apnea)    intolerant with CPAP (02/19/2018)  . SDH (subdural hematoma) ([place]) 01/2018  . SIADH (syndrome of inappropriate ADH production) ([place])   . Skin cancer    "side of my nose" (11/19/2017)  . Stroke ([place]) 12/2017   "fully recovered"  . Thrombocytopenia (HCC)    Mild- platelet count 143,000 on 02/2011, stable 08/2011    Past Surgical History:  Procedure Laterality Date  . APPENDECTOMY  1950  . BACK SURGERY    . BURR HOLE Bilateral 01/25/2018   Procedure: Haskell Flirt;  Surgeon: Kary Kos, MD;  Location: [place];  Service: Neurosurgery;  Laterality: Bilateral;  . BURR HOLE Left 02/20/2018   Procedure: BURR HOLES for subdural hematoma;  Surgeon: Newman Pies,  MD;  Location: [place];  Service: Neurosurgery;  Laterality: Left;  . BURR HOLE Left 03/20/2018   Procedure: Craniotomy for subdural hematoma;  Surgeon: Kristeen Miss, MD;  Location: [place];  Service: Neurosurgery;  Laterality: Left;  . FOREARM FRACTURE SURGERY Right 1954  . LUMBAR DISC SURGERY  1959   ruptured disc repair  . RIGHT/LEFT HEART CATH AND  CORONARY ANGIOGRAPHY N/A 11/21/2017   Procedure: RIGHT/LEFT HEART CATH AND CORONARY ANGIOGRAPHY;  Surgeon: Sherren Mocha, MD;  Location: [place] [facility];  Service: Cardiovascular;  Laterality: N/A;  . SKIN CANCER EXCISION Left    "side of my nose"    Family History  Problem Relation Age of Onset  . Heart failure Mother   . Heart attack Father   . [name] Father   . Heart attack Brother   . [name] Brother   . Colon cancer Brother   . Colon cancer Sister     Social History:  Married.  Needed supervision with activity and cognitive tasks. He  reports that he quit smoking about 61 years ago. His smoking use included cigarettes. He quit after 3.00 years of use. He has never used smokeless tobacco. He reports that he does not drink alcohol or use drugs.   Allergies  Allergen Reactions  . Novocain [Procaine] Rash   Medications Prior to Admission  Medication Sig Dispense Refill  . atorvastatin (LIPITOR) 20 MG tablet Take 1 tablet (20 mg total) by mouth every evening. 30 tablet 0  . digoxin (LANOXIN) 0.125 MG tablet Take 1 tablet (0.125 mg total) by mouth daily. 30 tablet 0  . finasteride (PROSCAR) 5 MG tablet Take 1 tablet (5 mg total) by mouth daily. 30 tablet 0  . fluticasone (FLONASE) 50 MCG/ACT nasal spray Place 1 spray into both nostrils daily.    Marland Kitchen gabapentin (NEURONTIN) 300 MG capsule Take 1 capsule (300 mg total) by mouth at bedtime. 30 capsule 0  . levETIRAcetam (KEPPRA) 500 MG tablet Take 1 tablet (500 mg total) by mouth 2 (two) times daily. 60 tablet 0  . metoprolol succinate (TOPROL-XL) 25 MG 24 hr tablet Take 1 tablet (25 mg total) by mouth daily. 30 tablet 0  . ranitidine (ZANTAC) 150 MG tablet Take 150 mg by mouth 2 (two) times daily.    . traZODone (DESYREL) 50 MG tablet Take 0.5-1 tablets (25-50 mg total) by mouth at bedtime as needed for sleep. 30 tablet 0  . docusate sodium (COLACE) 100 MG capsule Take 1 capsule (100 mg total) by mouth daily as needed for mild constipation.  (Patient not taking: Reported on 03/19/2018) 10 capsule 0  . pantoprazole (PROTONIX) 40 MG tablet Take 1 tablet (40 mg total) by mouth every evening. (Patient not taking: Reported on 03/19/2018) 30 tablet 0    Drug Regimen Review  Drug regimen was reviewed and remains appropriate with no significant issues identified  Home: Home Living Family/patient expects to be discharged to:: Private residence Living Arrangements: Spouse/significant other Available Help at Discharge: Family, Available 24 hours/day Type of Home: House Home Access: Stairs to enter [employer] of Steps: 3+3 Entrance Stairs-Rails: Right, Left Home Layout: One level Bathroom Shower/Tub: Gaffer, Curtain, Chiropodist: Standard Bathroom Accessibility: Yes Home Equipment: Environmental consultant - standard, Bedside commode, Grab bars - tub/shower, Shower seat Additional Comments: recent admits each month since January  Lives With: Spouse   Functional History: Prior Function Level of Independence: Needs assistance Gait / Transfers Assistance Needed: Post CIR pt reports that he  was able to walk without an AD in the home ADL's / Homemaking Assistance Needed: Post CIR, pt performing BADLs with supervision Comments: Pt reports multiple falls and multiple admissions since January   Functional Status:  Mobility: Bed Mobility Overal bed mobility: Needs Assistance Bed Mobility: Supine to Sit, Sit to Supine Supine to sit: Min assist Sit to supine: Min assist General bed mobility comments: MinA to support trunk upright into sitting; close minguard when returning to supine and to assist with lines, pt able to scoot self towards [facility] with increased time/effort  Transfers Overall transfer level: Needs assistance Equipment used: Rolling walker (2 wheeled) Transfers: Sit to/from Stand Sit to Stand: Min assist, +2 safety/equipment General transfer comment: Steadying assist upon standing; pt completing  sit<>stand from EOB and toilet  Ambulation/Gait Ambulation/Gait assistance: Min assist(episodes of mod assist with more retropulsion and fatigue) Ambulation Distance (Feet): 130 Feet Assistive device: Rolling walker (2 wheeled) Gait Pattern/deviations: Step-through pattern General Gait Details: retropulsive in nature, mildly unsteady Gait velocity: decreased    ADL: ADL Overall ADL's : Needs assistance/impaired Eating/Feeding: Set up, Sitting Grooming: Wash/dry hands, Minimal assistance Grooming Details (indicate cue type and reason): MinA for standing balance and safety; min cues for initiating task  Upper Body Bathing: Set up, Supervision/ safety, Sitting Lower Body Bathing: Minimal assistance, Sit to/from stand Lower Body Bathing Details (indicate cue type and reason): Min A for dynamic balance Upper Body Dressing : Set up, Sitting, Supervision/safety Lower Body Dressing: Minimal assistance, Sit to/from stand Lower Body Dressing Details (indicate cue type and reason): MinA for dynamic balance; pt able to don R sock sitting EOB given increased time/effort Toilet Transfer: Minimal assistance, Ambulation, Cueing for safety, +2 for safety/equipment Toilet Transfer Details (indicate cue type and reason): minA for balance and min cues for sequencing turning to sit on toilet  Toileting- Clothing Manipulation and Hygiene: Minimal assistance, Sit to/from stand Toileting - Clothing Manipulation Details (indicate cue type and reason): MinA for standing balance while pt performs pericare after BM; intermittent minA for gown management Functional mobility during ADLs: Minimal assistance, +2 for safety/equipment General ADL Comments: MinA using HHA for room level functional mobility, use of RW and HHA during hallway level functional mobility (+2 for safety throughout), at times requires close to [facility] for dynamic support; min cues for sequencing use of RW; pt tends to demonstrate increased posterior  lean during activity progression, requiring MinA and min cues to correct; pt often reaching out/seeking UE support within environment during mobility and functional task completion  Cognition: Cognition Overall Cognitive Status: Impaired/Different from baseline Orientation Level: Oriented X4 Cognition Arousal/Alertness: Awake/alert Behavior During Therapy: WFL for tasks assessed/performed Overall Cognitive Status: Impaired/Different from baseline Area of Impairment: Attention, Problem solving Current Attention Level: Sustained Memory: Decreased short-term memory Following Commands: Follows one step commands consistently, Follows one step commands with increased time Problem Solving: Decreased initiation, Difficulty sequencing, Requires verbal cues, Requires tactile cues General Comments: pt requires min directional cues for initiating and sequencing through ADL and functional tasks safely   Blood pressure (!) 129/91, pulse 78, temperature 97.7 F (36.5 C), temperature source Oral, resp. rate (!) 24, height 5\' 10"  (1.778 m), weight 79.4 kg (175 lb 0.7 oz), SpO2 95 %. Physical Exam  Nursing note and vitals reviewed. Constitutional: He appears well-developed and well-nourished.  HENT:  Left Craniotomy site with staples c/d/i Prior right crani site well healed  Eyes: EOM are normal. Right eye exhibits no discharge. Left eye exhibits no discharge.  Neck: Normal  range of motion. Neck supple.  Cardiovascular: An irregularly irregular rhythm present.  Occasional extrasystoles are present.  Irregularly irregular  Respiratory: Effort normal and breath sounds normal. No respiratory distress.  Decreased on left.  GI: Soft. Bowel sounds are normal.  Musculoskeletal:  No  edema or tenderness in extremities   Neurological: He is alert.  Mild dysarthria. Oriented and able to remember DOB --has birthday coming next month.  Some perseveration noted with tendency to get disoriented.  Motor:  RUE/RLE: 4+/5 proximal to distal, except for limited ROM in right shoulder 2/5 (chronic) LUE/LLE: 4+-5/5 proximal to distal RLE with ataxia.  Sensation intact to light touch   Psychiatric: He has a normal mood and affect. His behavior is normal.    No results found for this or any previous visit (from the past 48 hour(s)). Ct Head Wo Contrast  Addendum Date: 03/23/2018   ADDENDUM REPORT: 03/23/2018 20:33 ADDENDUM: There is a transcription error in the first point of the impression, which should read, "with decreased tension effects on the frontal pole." Additionally, small amount of right frontal pole extra-axial blood is unchanged. Persistent rightward subfalcine translation of the cingulate gyrus appears slightly improved. Electronically Signed   By: Ulyses Jarred M.D.   On: 03/23/2018 20:33   Result Date: 03/23/2018 CLINICAL DATA:  Intracranial hemorrhage follow up EXAM: CT HEAD WITHOUT CONTRAST TECHNIQUE: Contiguous axial images were obtained from the base of the skull through the vertex without intravenous contrast. COMPARISON:  Head CT 03/20/2018 and before FINDINGS: Brain: The left-sided subdural drainage catheter has been removed. Pneumocephalus has decreased. There remains mild tension effect on the frontal pole. Hyperdense blood subjacent to the previous location of the drain is approximately unchanged. Hematoma volume is unchanged in thickness measuring approximately 13 mm. Rightward midline shift has decreased slightly, now measuring 7 mm. Mass effect on the left lateral ventricle has decreased slightly. Basal cisterns are patent. Continued expected evolution of right cerebellar infarct. Vascular: Atherosclerotic calcification of the internal carotid arteries at the skull base. No hyperdense vessel. Skull: Left parietal craniotomy. Sinuses/Orbits: No sinus fluid levels or advanced mucosal thickening. No mastoid effusion. Normal orbits. IMPRESSION: 1. Slight improvement in rightward midline  shift secondary to partial resorption of pneumocephalus, with the clear [place] tension affect on the frontal pole. 2. Unchanged size of left convexity subdural hematoma with hyperdense component directly underlying the craniotomy site. Electronically Signed: By: Ulyses Jarred M.D. On: 03/23/2018 20:13       Medical Problem List and Plan: 1.  Deficits with mobility, self-care, and endurance secondary to recurrent left SDH. 2.  DVT Prophylaxis/Anticoagulation: Mechanical: Sequential compression devices, below knee Bilateral lower extremities 3. Pain Management: On Neurontin for neuropathic symptoms.  4. Mood: Team to provide ego support. LCSW to follow for evaluation and support.  5. Neuropsych: This patient is not fully  capable of making decisions on his  own behalf. 6. Skin/Wound Care: Monitor wound for healing. Routine pressure relief measures.  7. Fluids/Electrolytes/Nutrition: Strict I/O. Maintain adequate nutritional support.  8. PAF: Monitor HR bid. Off anticoagulation due to recurrent bleeds. On metoprolol and lanoxin.  9. [name]/Chronic systolic CHF: Strict I/O. Monitor weights daily. On metoprolol  10. HTN: Monitor BP bid. Continue metoprolol bid.  11. Seizure prophylaxis: NS recommended  Keppra bid for 2-3 months   12. Insomnia: Will schedule trazodone early evening to help with sleep hygiene.  13. OSA: CPAP not effective and was schedule to have sleep study. Will order oxygen for use at night  prn.  Post Admission Physician Evaluation: 1. Preadmission assessment reviewed and changes made below. 2. Functional deficits secondary  to recurrent left SDH. 3. Patient is admitted to receive collaborative, interdisciplinary care between the physiatrist, rehab nursing staff, and therapy team. 4. Patient's level of medical complexity and substantial therapy needs in context of that medical necessity cannot be provided at a lesser intensity of care such as a SNF. 5. Patient has experienced  substantial functional loss from his/her baseline which was documented above under the "Functional History" and "Functional Status" headings.  Judging by the patient's diagnosis, physical exam, and functional history, the patient has potential for functional progress which will result in measurable gains while on inpatient rehab.  These gains will be of substantial and practical use upon discharge  in facilitating mobility and self-care at the household level. 6. Physiatrist will provide 24 hour management of medical needs as well as oversight of the therapy plan/treatment and provide guidance as appropriate regarding the interaction of the two. 7. 24 hour rehab nursing will assist with bladder management, safety, skin/wound care, disease management, medication administration, pain management and patient education  and help integrate therapy concepts, techniques,education, etc. 8. PT will assess and treat for/with: Lower extremity strength, range of motion, stamina, balance, functional mobility, safety, adaptive techniques and equipment, wound care, coping skills, pain control, education. Goals are: Supervision. 9. OT will assess and treat for/with: ADL's, functional mobility, safety, upper extremity strength, adaptive techniques and equipment, wound mgt, ego support, and community reintegration.   Goals are: Supervision. Therapy may not proceed with showering this patient. 10. Case Management and Social Worker will assess and treat for psychological issues and discharge planning. 11. Team conference will be held weekly to assess progress toward goals and to determine barriers to discharge. 12. Patient will receive at least 3 hours of therapy per day at least 5 days per week. 13. ELOS: 7-10 days.       14. Prognosis:  good  I have personally performed a face to face diagnostic evaluation, including, but not limited to relevant history and physical exam findings, of this patient and developed relevant  assessment and plan.  Additionally, I have reviewed and concur with the physician assistant's documentation above.  Delice Lesch, MD, ABPMR Bary Leriche, PA-C 03/25/2018

## 2018-03-25 NOTE — Care Management Note (Signed)
Case Management Note  Patient Details  Name: Ryan Savage MRN: [id number] Date of Birth: 17-Aug-1929  Subjective/Objective:  Pt is an 84 y/o male admitted with c/o of R LE weakness; CT shows increasing size of L SDH with mass effect.  Pt s/p craniotomy for evacuation of chronic and subacute SDH on 03/20/18.  PTA, pt needs assistance with ADLS; lives with wife.  He is active with [facility], [place], and OT.                    Action/Plan: PT/OT recommending CIR, and insurance authorization in process.  Will follow progress.    Expected Discharge Date:  03/25/18               Expected Discharge Plan:  [place]  In-House Referral:     Discharge planning Services  CM Consult  Post Acute Care Choice:    Choice offered to:     DME Arranged:    DME Agency:     HH Arranged:    HH Agency:     Status of Service:  Completed, signed off  If discussed at [employer], dates discussed:    Additional Comments:  03/25/18 J. Darrion Macaulay, RN, BSN Pt medically stable for discharge today, and insurance auth received for rehab admission.    Reinaldo Raddle, RN, BSN  Trauma/Neuro ICU Case Manager [phone]

## 2018-03-25 NOTE — Progress Notes (Signed)
Physical Therapy Treatment Patient Details Name: Ryan Savage MRN: [id number] DOB: 20-Oct-1929 Today's Date: 03/25/2018    History of Present Illness pt is an 84 y/o male with pmh significant for stroke, MI, HTN, [name], [name], recurrent SDH's, admitted with c/o of R LE weakness.      CT shows increasing size of L SDH with mass effect.  5/10 pt s/p Left frontoptemporoparietal crani for evacuation of chronic and subacute SDH.    PT Comments    Pt progressing steadily.  Emphasis today on coordinating scooting, sit to stand, standing balance and gait stability.   Follow Up Recommendations  CIR     Equipment Recommendations  Rolling walker with 5" wheels    Recommendations for Other Services       Precautions / Restrictions      Mobility  Bed Mobility               General bed mobility comments: OOB in the chair on arrival  Transfers Overall transfer level: Needs assistance Equipment used: Rolling walker (2 wheeled) Transfers: Sit to/from Stand Sit to Stand: Min assist         General transfer comment: cues for set up and hand placement, assist to come and stay forward while pt powered up.  Ambulation/Gait Ambulation/Gait assistance: Min assist Ambulation Distance (Feet): 160 Feet Assistive device: Rolling walker (2 wheeled) Gait Pattern/deviations: Step-through pattern Gait velocity: decreased   General Gait Details: still listing posteriorly and wandering off to the right while pushing the RW left.  Result of pt crashing his toes into the right rear leg.  As pt fatigued, his knees became more flexed, needing more stability assist.   Stairs             Wheelchair Mobility    Modified Rankin (Stroke Patients Only) Modified Rankin (Stroke Patients Only) Pre-Morbid Rankin Score: Slight disability Modified Rankin: Moderately severe disability     Balance Overall balance assessment: Needs assistance Sitting-balance support: No upper extremity  supported Sitting balance-Leahy Scale: Good       Standing balance-Leahy Scale: Fair                              Cognition Arousal/Alertness: Awake/alert Behavior During Therapy: WFL for tasks assessed/performed Overall Cognitive Status: Impaired/Different from baseline Area of Impairment: Attention;Problem solving                   Current Attention Level: Selective;Sustained Memory: Decreased short-term memory Following Commands: Follows one step commands with increased time     Problem Solving: Decreased initiation;Slow processing;Difficulty sequencing;Requires verbal cues        Exercises      General Comments        Pertinent Vitals/Pain Pain Assessment: Faces Faces Pain Scale: No hurt    Home Living   Living Arrangements: Spouse/significant other           Home Equipment: Walker - standard;Bedside commode;Grab bars - tub/shower;Shower seat Additional Comments: recent admits each month since January    Prior Function    Gait / Transfers Assistance Needed: Post CIR pt reports that he was able to walk without an AD in the home ADL's / Homemaking Assistance Needed: Post CIR, pt performing BADLs with supervision Comments: Pt reports multiple falls and multiple admissions since January    PT Goals (current goals can now be found in the care plan section) Acute Rehab PT Goals Patient Stated Goal: back  home and able to do for myself. PT Goal Formulation: With patient Time For Goal Achievement: 04/04/18 Potential to Achieve Goals: Good Progress towards PT goals: Progressing toward goals    Frequency    Min 3X/week      PT Plan Discharge plan needs to be updated    Co-evaluation              AM-PAC PT "6 Clicks" Daily Activity  Outcome Measure  Difficulty turning over in bed (including adjusting bedclothes, sheets and blankets)?: Unable Difficulty moving from lying on back to sitting on the side of the bed? :  Unable Difficulty sitting down on and standing up from a chair with arms (e.g., wheelchair, bedside commode, etc,.)?: Unable Help needed moving to and from a bed to chair (including a wheelchair)?: A Little Help needed walking in hospital room?: A Little Help needed climbing 3-5 steps with a railing? : A Little 6 Click Score: 12    End of Session   Activity Tolerance: Patient tolerated treatment well Patient left: in chair;with call bell/phone within reach Nurse Communication: Mobility status PT Visit Diagnosis: Unsteadiness on feet (R26.81);Other abnormalities of gait and mobility (R26.89);Other symptoms and signs involving the nervous system (R29.898);Hemiplegia and hemiparesis Hemiplegia - Right/Left: Right Hemiplegia - dominant/non-dominant: Dominant Hemiplegia - caused by: Nontraumatic intracerebral hemorrhage     Time: 8676-1950 PT Time Calculation (min) (ACUTE ONLY): 27 min  Charges:  $Gait Training: 8-22 mins $Therapeutic Activity: 8-22 mins                    G Codes:       2018/04/13  [name], PT [phone]  (pager)   Ryan Savage Apr 13, 2018, 3:07 PM

## 2018-03-25 NOTE — Progress Notes (Signed)
Ryan Gong, RN  Rehab Admission Coordinator  Physical Medicine and Rehabilitation  PMR Pre-admission  Signed  Date of Service:  03/25/2018 1:53 PM       Related encounter: ED to Hosp-Admission (Current) from 03/19/2018 in [facility]           Show:Clear all [x] Manual[x] Template[x] Copied  Added by: [x] Ryan Gong, RN   [] Hover for details   PMR Admission Coordinator Pre-Admission Assessment  Patient: [facility] is an 84 y.o., male MRN: [id number] DOB: 04-05-1929 Height: 5\' 10"  (177.8 cm) Weight: 79.4 kg (175 lb 0.7 oz)                                                                                                                                                  Insurance Information HMO:     PPO: yes     PCP:      IPA:      80/20:      OTHER: medicare advantage plan PRIMARY: Gas      Policy#: [id number]      Subscriber: pt CM Name: [facility]      Phone#: [phone]     Fax#: [phone] Pre-Cert#: [id number]   Approved for 7 days with f/u with Vevelyn Royals phone [phone] fax [phone]   Employer: retired Benefits:  Phone #: [phone]     Name: 03/24/2018 Eff. Date: 11/11/2017     Deduct: none      Out of Pocket Max: $4000      Life Max: none CIR: $160 co pay per day days 1 until 10      SNF: no co pay days 1 to 20; $50 co pay per day days 21 to 100 Outpatient: $20 co pay per visit     Co-Pay: visits per medical neccesity Home Health: 100%      Co-Pay: visits per medical neccesity DME: 80%     Co-Pay: 20% Providers: in network  SECONDARY: none       Medicaid Application Date:       Case Manager:  Disability Application Date:       Case Worker:   Emergency [employer] copy Information    Name Relation Home Work Mobile   Ryan [facility] Spouse [phone]  [phone]   [facility], [facility]   [phone]     Current Medical History  Patient  Admitting Diagnosis: recurrent SDH  History of Present Illness:  RAQ:[id number] Thompsonis a 84 y.o.malewith history of OSA, [name], [name], [name], multiple SDH s/p evacuation on 01/25/18 and 02/19/18 with recent rehab stay and was discharged to home on 4/24 with [facility] therapy. He was readmitted on 03/19/18 with right sided weakness and fall. CT headreviewed, showing left SDH.  Per report, re accumulation of left SDH with mass effect and early rightward subfalcine herniation. He underwent emergent left crani for evacuation of bleed by Dr. Ellene Route. Post op, he has had mild confusion, delayed processing as well as balance deficits with retropulsion with standing/ambulation.   Repeat CT demonstrated moderate quantity of air with slight left shift that was noted previously. Dr. Ellene Route plans continued supportive care and monitoring of CT scans.  Past Medical History      Past Medical History:  Diagnosis Date  . Atrial fibrillation, chronic (HCC)    Not on anticoagulation due to history of bilateral subdural bleeds due to recurrent falls  . BPH (benign prostatic hypertrophy)   . [name] (coronary artery disease), native coronary artery    Severe two-vessel [name] with chronically occluded LAD and RCA with widely patent left main, intermediate, and left circumflex branches.  On medical management.  He has extensive collaterals.  . 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 fib with RVR  . [name] (degenerative joint disease)   . GERD (gastroesophageal reflux disease)   . Hyperlipidemia    LDL goal less then 100  . Hypertension   . Microscopic hematuria    [name]  . Mitral valve prolapse    with moderate MR  . Myocardial infarction [facility])    "was told 11/2017 that he'd had a heart attack; don't know  when it was" (02/19/2018)  . OSA (obstructive sleep apnea)    intolerant with CPAP (02/19/2018)  . SDH (subdural hematoma) ([place]) 01/2018  . SIADH (syndrome of inappropriate ADH production) ([place])   . Skin cancer    "side of my nose" (11/19/2017)  . Stroke ([place]) 12/2017   "fully recovered"  . Thrombocytopenia (HCC)    Mild- platelet count 143,000 on 02/2011, stable 08/2011    Family History  family history includes [name] in his brother and father; Colon cancer in his brother and sister; Heart attack in his brother and father; Heart failure in his mother.  Prior Rehab/Hospitalizations:  Has the patient had major surgery during 100 days prior to admission? Yes  CIR d/c 03/05/2018 after 9 days. [facility] with [facility] with PT, OT, and SLP. Overall supervision level  Current Medications   Current Facility-Administered Medications:  .  atorvastatin (LIPITOR) tablet 20 mg, 20 mg, Oral, QPM, Eustace Moore, MD, 20 mg at 03/24/18 1805 .  bisacodyl (DULCOLAX) suppository 10 mg, 10 mg, Rectal, Daily PRN, Eustace Moore, MD .  digoxin Fonnie Birkenhead) tablet 0.125 mg, 0.125 mg, Oral, Daily, Eustace Moore, MD, 0.125 mg at 03/25/18 0908 .  docusate sodium (COLACE) capsule 100 mg, 100 mg, Oral, BID, Eustace Moore, MD, 100 mg at 03/25/18 0908 .  famotidine (PEPCID) tablet 20 mg, 20 mg, Oral, BID, Eustace Moore, MD, 20 mg at 03/25/18 0908 .  feeding supplement (ENSURE ENLIVE) (ENSURE ENLIVE) liquid 237 mL, 237 mL, Oral, TID BM, Eustace Moore, MD, 237 mL at 03/25/18 0910 .  finasteride (PROSCAR) tablet 5 mg, 5 mg, Oral, Daily, Eustace Moore, MD, 5 mg at 03/25/18 0908 .  fluticasone (FLONASE) 50 MCG/ACT nasal spray 1 spray, 1 spray, Each Nare, Daily, Eustace Moore, MD, 1 spray at 03/25/18 0920 .  gabapentin (NEURONTIN) capsule 300 mg, 300 mg, Oral, QHS, Eustace Moore, MD, 300 mg at 03/24/18  2113 .  HYDROcodone-acetaminophen (NORCO/VICODIN) 5-325 MG per tablet 1 tablet, 1 tablet, Oral, Q4H PRN,  Eustace Moore, MD, 1 tablet at 03/21/18 2114 .  labetalol (NORMODYNE,TRANDATE) injection 10-40 mg, 10-40 mg, Intravenous, Q10 min PRN, Eustace Moore, MD .  lactated ringers infusion, , Intravenous, Continuous, Eustace Moore, MD, Last Rate: 75 mL/hr at 03/25/18 0355 .  levETIRAcetam (KEPPRA) IVPB 500 mg/100 mL premix, 500 mg, Intravenous, BID, Eustace Moore, MD, Stopped at 03/25/18 [phone] .  metoprolol succinate (TOPROL-XL) 24 hr tablet 25 mg, 25 mg, Oral, Daily, Eustace Moore, MD, 25 mg at 03/25/18 0908 .  morphine 4 MG/ML injection 2 mg, 2 mg, Intravenous, Q2H PRN, Eustace Moore, MD .  multivitamin with minerals tablet 1 tablet, 1 tablet, Oral, Daily, Kristeen Miss, MD, 1 tablet at 03/25/18 0908 .  ondansetron (ZOFRAN) tablet 4 mg, 4 mg, Oral, Q4H PRN **OR** ondansetron (ZOFRAN) injection 4 mg, 4 mg, Intravenous, Q4H PRN, Eustace Moore, MD .  polyethylene glycol (MIRALAX / GLYCOLAX) packet 17 g, 17 g, Oral, Daily PRN, Eustace Moore, MD .  promethazine (PHENERGAN) tablet 12.5-25 mg, 12.5-25 mg, Oral, Q4H PRN, Eustace Moore, MD .  senna [facility]) tablet 8.6 mg, 1 tablet, Oral, BID, Eustace Moore, MD, 8.6 mg at 03/25/18 0908 .  sodium phosphate (FLEET) 7-19 GM/118ML enema 1 enema, 1 enema, Rectal, Once PRN, Eustace Moore, MD .  traZODone (DESYREL) tablet 25-50 mg, 25-50 mg, Oral, QHS PRN, Eustace Moore, MD, 25 mg at 03/24/18 2114  Patients Current Diet:       Diet Order           Diet regular Room service appropriate? Yes; Fluid consistency: Thin  Diet effective now          Precautions / Restrictions Precautions Precautions: Fall Restrictions Weight Bearing Restrictions: No   Has the patient had 2 or more falls or a fall with injury in the past year?Yes  Prior Activity Level Limited Community (1-2x/wk): gradual decline in function since January due to multiple medical issues and admits Community (5-7x/wk): was driving and not using AD prior to January  Home  Assistive Devices / [place] Devices/Equipment: None Home Equipment: Environmental consultant - standard, Bedside commode, Grab bars - tub/shower, Shower seat  Prior Device Use: Indicate devices/aids used by the patient prior to current illness, exacerbation or injury? Walker  Prior Functional Level Prior Function Level of [place]: Needs assistance Gait / Transfers Assistance Needed: Post CIR pt reports that he was able to walk without an AD in the home ADL's / Homemaking Assistance Needed: Post CIR, pt performing BADLs with supervision Comments: Pt reports multiple falls and multiple admissions since January   Self Care: Did the patient need help bathing, dressing, using the toilet or eating?  Needed some help  Indoor Mobility: Did the patient need assistance with walking from room to room (with or without device)? Needed some help  Stairs: Did the patient need assistance with internal or external stairs (with or without device)? Needed some help  Functional Cognition: Did the patient need help planning regular tasks such as shopping or remembering to take medications? Needed some help  Current Functional Level Cognition  Arousal/Alertness: Awake/alert Overall Cognitive Status: Impaired/Different from baseline Current Attention Level: Sustained Orientation Level: Oriented X4 Following Commands: Follows one step commands consistently, Follows one step commands with increased time General Comments: pt requires min directional cues for initiating and sequencing through ADL and functional tasks  safely Attention: Selective    Extremity Assessment (includes Sensation/Coordination)  Upper Extremity Assessment: Overall WFL for tasks assessed, RUE deficits/detail RUE Deficits / Details: Slight weakness in RUE compared to left. WFL for grooming tasks  Lower Extremity Assessment: Defer to PT evaluation    ADLs  Overall ADL's : Needs assistance/impaired Eating/Feeding: Set  up, Sitting Grooming: Wash/dry hands, Minimal assistance Grooming Details (indicate cue type and reason): MinA for standing balance and safety; min cues for initiating task  Upper Body Bathing: Set up, Supervision/ safety, Sitting Lower Body Bathing: Minimal assistance, Sit to/from stand Lower Body Bathing Details (indicate cue type and reason): Min A for dynamic balance Upper Body Dressing : Set up, Sitting, Supervision/safety Lower Body Dressing: Minimal assistance, Sit to/from stand Lower Body Dressing Details (indicate cue type and reason): MinA for dynamic balance; pt able to don R sock sitting EOB given increased time/effort Toilet Transfer: Minimal assistance, Ambulation, Cueing for safety, +2 for safety/equipment Toilet Transfer Details (indicate cue type and reason): minA for balance and min cues for sequencing turning to sit on toilet  Toileting- Clothing Manipulation and Hygiene: Minimal assistance, Sit to/from stand Toileting - Clothing Manipulation Details (indicate cue type and reason): MinA for standing balance while pt performs pericare after BM; intermittent minA for gown management Functional mobility during ADLs: Minimal assistance, +2 for safety/equipment General ADL Comments: MinA using HHA for room level functional mobility, use of RW and HHA during hallway level functional mobility (+2 for safety throughout), at times requires close to [facility] for dynamic support; min cues for sequencing use of RW; pt tends to demonstrate increased posterior lean during activity progression, requiring MinA and min cues to correct; pt often reaching out/seeking UE support within environment during mobility and functional task completion    Mobility  Overal bed mobility: Needs Assistance Bed Mobility: Supine to Sit, Sit to Supine Supine to sit: Min assist Sit to supine: Min assist General bed mobility comments: MinA to support trunk upright into sitting; close minguard when returning to  supine and to assist with lines, pt able to scoot self towards [facility] with increased time/effort     Transfers  Overall transfer level: Needs assistance Equipment used: Rolling walker (2 wheeled) Transfers: Sit to/from Stand Sit to Stand: Min assist, +2 safety/equipment General transfer comment: Steadying assist upon standing; pt completing sit<>stand from EOB and toilet     Ambulation / Gait / Stairs / Wheelchair Mobility  Ambulation/Gait Ambulation/Gait assistance: Min assist(episodes of mod assist with more retropulsion and fatigue) Ambulation Distance (Feet): 130 Feet Assistive device: Rolling walker (2 wheeled) Gait Pattern/deviations: Step-through pattern General Gait Details: retropulsive in nature, mildly unsteady Gait velocity: decreased    Posture / Balance Dynamic Sitting Balance Sitting balance - Comments: able to sit EOB with minguard assist while donning sock Balance Overall balance assessment: Needs assistance Sitting-balance support: No upper extremity supported Sitting balance-Leahy Scale: Good Sitting balance - Comments: able to sit EOB with minguard assist while donning sock Standing balance support: During functional activity, Bilateral upper extremity supported, Single extremity supported Standing balance-Leahy Scale: Fair Standing balance comment: overall requires MinA to maintain standing balance; minA and cues for posture as pt tends to list posteriorly     Special needs/care consideration BiPAP/CPAP  N/a CPM  N/a Continuous Drip IV  N/a Dialysis  N/a Life Vest  N/a Oxygen  N/a Special Bed  N/a Trach Size  N/a Wound Vac n/a Skin head surgical incision with staples, ecchymosis to BUE; rash bilateral groin areas  Bowel mgmt: some incontinence Bladder mgmt: external catheter Diabetic mgmt n/a Contact precautions for MRSA   Previous Home Environment Living Arrangements: Spouse/significant other  Lives With: Spouse Available Help at Discharge:  Family, Available 24 hours/day Type of Home: House Home Layout: One level Home Access: Stairs to enter Entrance Stairs-Rails: Right, Left Entrance Stairs-Number of Steps: 3+3 Bathroom Shower/Tub: Gaffer, Curtain, Chiropodist: Standard Bathroom Accessibility: Yes How Accessible: Accessible via walker [place]: No Type of Home Care Services: Home PT, Home RN [place] (if known): [facility] Additional Comments: recent admits each month since January  Discharge Living Setting Plans for Discharge Living Setting: Patient's home, Lives with (comment) Type of Home at Discharge: House Discharge Home Layout: One level Discharge Home Access: Stairs to enter Entrance Stairs-Rails: Right Entrance Stairs-Number of Steps: 3 plus 3 Discharge Bathroom Shower/Tub: Walk-in shower, Curtain Discharge Bathroom Toilet: Standard Discharge Bathroom Accessibility: Yes How Accessible: Accessible via walker Does the patient have any problems obtaining your medications?: No  Social/Family/Support Systems Patient Roles: Spouse, Parent Contact Information: [place], wife Anticipated Caregiver: Wife Anticipated Caregiver's Contact Information: see above Ability/Limitations of Caregiver: Wife is frail and can only provide supervision level. [place] is involved and will check in on daily Caregiver Availability: 24/7 Discharge Plan Discussed with Primary Caregiver: Yes Is Caregiver In Agreement with Plan?: Yes Does Caregiver/Family have Issues with Lodging/Transportation while Pt is in Rehab?: No   Goals/Additional Needs Patient/Family Goal for Rehab: Mod I to supervision with PT, OT and SLP Expected length of stay: ELOS 7 to 10 days Pt/Family Agrees to Admission and willing to participate: Yes Program Orientation Provided & Reviewed with Pt/Caregiver Including Roles  & Responsibilities: Yes  Barriers to Discharge: Medical stability   Decrease  burden of Care through IP rehab admission: n/a  Possible need for SNF placement upon discharge: n/a   Patient Condition: This patient's medical and functional status has changed since the consult dated 03/23/2018 in which the Rehabilitation Physician determined and documented that the patient was potentially appropriate for intensive rehabilitative care in an inpatient rehabilitation facility. Issues have been addressed and update has been discussed with Dr. Posey Pronto and patient now appropriate for inpatient rehabilitation. Will admit to inpatient rehab today.   Preadmission Screen Completed By:  Cleatrice Burke, 03/25/2018 1:54 PM ______________________________________________________________________   Discussed status with Dr. Posey Pronto on 03/25/2018 at  1420 and received telephone approval for admission today.  Admission Coordinator:  Cleatrice Burke, time 4920 Date 03/25/2018             Cosigned by: Jamse Arn, MD at 03/25/2018 2:23 PM  Revision History

## 2018-03-25 NOTE — Progress Notes (Signed)
Ryan Arn, MD  Physician  Physical Medicine and Rehabilitation  Consult Note  Addendum  Date of Service:  03/23/2018 12:18 PM       Related encounter: ED to Hosp-Admission (Current) from 03/19/2018 in [facility] All Collapse All      Show:Clear all [x] Manual[x] Template[] Copied  Added by: [x] Love, Ivan Anchors, PA-C[x] Ryan Arn, MD   [] Hover for details        Physical Medicine and Rehabilitation Consult   Reason for Consult: Recurrent SDH with gait disorder Referring Physician: Dr. Ellene Route   HPI: Ryan Savage is a 84 y.o. male with history of OSA, [name], [name],  [name], multiple SDH s/p evacuation on 01/25/18 and 02/19/18 with recent rehab stay and was discharged to home on 4/24 with [facility] therapy. History taken from chart review, patient, and wife. He was readmitted on 03/19/18 with right sided weakness and fall. CT head reviewed, showing left SDH.  Per report, reaccumulation of left SDH with mass effect and early rightward subfalcine herniation. He underwent emergent left crani for evacuation of bleed by Dr. Ellene Route.  Post op, he has had mild confusion, delayed processing as well as balance deficits with retropulsion with standing/ambulation.  CIR recommended due to functional deficits.   Review of Systems  Constitutional: Positive for malaise/fatigue.  HENT: Negative for hearing loss and tinnitus.   Eyes: Negative for blurred vision and double vision.  Respiratory: Negative for cough and sputum production.   Cardiovascular: Negative for chest pain and palpitations.  Gastrointestinal: Negative for abdominal pain, heartburn and nausea.  Genitourinary: Negative for dysuria and urgency.  Musculoskeletal: Negative for back pain and myalgias.  Neurological: Positive for sensory change, speech change and weakness.  Psychiatric/Behavioral: Positive for memory loss. The patient is not  nervous/anxious and does not have insomnia.   All other systems reviewed and are negative.       Past Medical History:  Diagnosis Date  . Atrial fibrillation, chronic (HCC)    Not on anticoagulation due to history of bilateral subdural bleeds due to recurrent falls  . BPH (benign prostatic hypertrophy)   . [name] (coronary artery disease), native coronary artery    Severe two-vessel [name] with chronically occluded LAD and RCA with widely patent left main, intermediate, and left circumflex branches.  On medical management.  He has extensive collaterals.  . 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 fib with RVR  . [name] (degenerative joint disease)   . GERD (gastroesophageal reflux disease)   . Hyperlipidemia    LDL goal less then 100  . Hypertension   . Microscopic hematuria    [name]  . Mitral valve prolapse    with moderate MR  . Myocardial infarction [facility])    "was told 11/2017 that he'd had a heart attack; don't know when it was" (02/19/2018)  . OSA (obstructive sleep apnea)    intolerant with CPAP (02/19/2018)  . SDH (subdural hematoma) ([place]) 01/2018  . SIADH (syndrome of inappropriate ADH production) ([place])   . Skin cancer    "side of my nose" (11/19/2017)  . Stroke ([place]) 12/2017   "fully recovered"  . Thrombocytopenia (HCC)    Mild- platelet count 143,000 on 02/2011, stable 08/2011         Past Surgical  History:  Procedure Laterality Date  . APPENDECTOMY  1950  . BACK SURGERY    . BURR HOLE Bilateral 01/25/2018   Procedure: Haskell Flirt;  Surgeon: Kary Kos, MD;  Location: [facility];  Service: Neurosurgery;  Laterality: Bilateral;  . BURR HOLE Left 02/20/2018   Procedure: BURR HOLES for subdural hematoma;  Surgeon: Newman Pies, MD;  Location: [place];  Service: Neurosurgery;  Laterality: Left;    . FOREARM FRACTURE SURGERY Right 1954  . LUMBAR DISC SURGERY  1959   ruptured disc repair  . RIGHT/LEFT HEART CATH AND CORONARY ANGIOGRAPHY N/A 11/21/2017   Procedure: RIGHT/LEFT HEART CATH AND CORONARY ANGIOGRAPHY;  Surgeon: Sherren Mocha, MD;  Location: [facility];  Service: Cardiovascular;  Laterality: N/A;  . SKIN CANCER EXCISION Left    "side of my nose"        Family History  Problem Relation Age of Onset  . Heart failure Mother   . Heart attack Father   . [name] Father   . Heart attack Brother   . [name] Brother   . Colon cancer Brother   . Colon cancer Sister      Social History:  Married. Wife supportive and son checks in on them. He was ambulating with walker PTA. He  reports that he quit smoking about 61 years ago. His smoking use included cigarettes. He quit after 3.00 years of use. He has never used smokeless tobacco. He reports that he does not drink alcohol or use drugs.       Allergies  Allergen Reactions  . Novocain [Procaine] Rash         Medications Prior to Admission  Medication Sig Dispense Refill  . atorvastatin (LIPITOR) 20 MG tablet Take 1 tablet (20 mg total) by mouth every evening. 30 tablet 0  . digoxin (LANOXIN) 0.125 MG tablet Take 1 tablet (0.125 mg total) by mouth daily. 30 tablet 0  . finasteride (PROSCAR) 5 MG tablet Take 1 tablet (5 mg total) by mouth daily. 30 tablet 0  . fluticasone (FLONASE) 50 MCG/ACT nasal spray Place 1 spray into both nostrils daily.    Marland Kitchen gabapentin (NEURONTIN) 300 MG capsule Take 1 capsule (300 mg total) by mouth at bedtime. 30 capsule 0  . levETIRAcetam (KEPPRA) 500 MG tablet Take 1 tablet (500 mg total) by mouth 2 (two) times daily. 60 tablet 0  . metoprolol succinate (TOPROL-XL) 25 MG 24 hr tablet Take 1 tablet (25 mg total) by mouth daily. 30 tablet 0  . ranitidine (ZANTAC) 150 MG tablet Take 150 mg by mouth 2 (two) times daily.    . traZODone (DESYREL) 50 MG tablet Take 0.5-1 tablets  (25-50 mg total) by mouth at bedtime as needed for sleep. 30 tablet 0  . docusate sodium (COLACE) 100 MG capsule Take 1 capsule (100 mg total) by mouth daily as needed for mild constipation. (Patient not taking: Reported on 03/19/2018) 10 capsule 0  . pantoprazole (PROTONIX) 40 MG tablet Take 1 tablet (40 mg total) by mouth every evening. (Patient not taking: Reported on 03/19/2018) 30 tablet 0    Home: Home Living Family/patient expects to be discharged to:: Private residence Living Arrangements: Spouse/significant other Available Help at Discharge: Family, Available 24 hours/day Type of Home: House Home Access: Stairs to enter [employer] of Steps: 3+3 Entrance Stairs-Rails: Right, Left Home Layout: One level Bathroom Shower/Tub: Gaffer, Curtain, Chiropodist: Standard Bathroom Accessibility: Yes Home Equipment: Walker - standard, Bedside commode, Grab bars -  tub/shower, Shower seat Additional Comments: recent admits each month since January  Lives With: Spouse  Functional History: Prior Function Level of Independence: Needs assistance Gait / Transfers Assistance Needed: Post CIR pt reports that he was able to walk without an AD in the home ADL's / Homemaking Assistance Needed: Post CIR, pt performing BADLs with supervision Comments: Pt reports multiple falls and multiple admissions since January  Functional Status:  Mobility: Bed Mobility General bed mobility comments: NT, OOB in the chair Transfers Overall transfer level: Needs assistance Transfers: Sit to/from Stand Sit to Stand: Min assist General transfer comment: mild steady assist only. Ambulation/Gait Ambulation/Gait assistance: Min assist Ambulation Distance (Feet): 140 Feet Assistive device: Rolling walker (2 wheeled) Gait Pattern/deviations: Step-through pattern General Gait Details: retropulsive in nature, mildly unsteady Gait velocity: decreased  ADL: ADL Overall ADL's  : Needs assistance/impaired Eating/Feeding: Set up, Sitting Grooming: Oral care, Min guard, Standing, Cueing for sequencing, Minimal assistance Grooming Details (indicate cue type and reason): Min Verbal cues for locating grooming tasks. Pt requiring Min Guard A for balance and safety. Noted posterior lean with occasional Min A to correct balance Upper Body Bathing: Set up, Supervision/ safety, Sitting Lower Body Bathing: Minimal assistance, Sit to/from stand Lower Body Bathing Details (indicate cue type and reason): Min A for dynamic balance Upper Body Dressing : Set up, Sitting, Supervision/safety Lower Body Dressing: Minimal assistance, Sit to/from stand Lower Body Dressing Details (indicate cue type and reason): Min A for dynamic balance. Pt able to adjsut socks by bringing ankles to knees Toilet Transfer: Minimal assistance, Ambulation, Cueing for safety Toilet Transfer Details (indicate cue type and reason): simulated to recliner Functional mobility during ADLs: Minimal assistance General ADL Comments: Pt with decreased balance and requiring increased time for processing.  Cognition: Cognition Overall Cognitive Status: Difficult to assess Orientation Level: Oriented to person, Disoriented to place, Disoriented to time, Disoriented to situation Cognition Arousal/Alertness: Awake/alert Behavior During Therapy: WFL for tasks assessed/performed Overall Cognitive Status: Difficult to assess General Comments: pt is tangential of thought, but with redirection can relate sequence of events   Blood pressure (!) 134/93, pulse 82, temperature (!) 97.3 F (36.3 C), resp. rate (!) 21, height 5\' 10"  (1.778 m), weight 79.4 kg (175 lb 0.7 oz), SpO2 96 %. Physical Exam  Vitals reviewed. Constitutional: He appears well-developed and well-nourished.  HENT:  Head: Normocephalic and atraumatic.  Eyes: EOM are normal. Right eye exhibits no discharge. Left eye exhibits no discharge.  Neck: Normal  range of motion. Neck supple.  Cardiovascular:  Irregularly irregular  Respiratory: Effort normal and breath sounds normal.  GI: Soft. Bowel sounds are normal.  Musculoskeletal:  No edema or tenderness in extremities  Neurological: He is alert.  He was able to answer orientation questions with minimal cues.  Motor: RUE/RLE: 4+/5 proximal to distal LUE/LLE: 4+-5/5 proximal to distal RLE with ataxia.  Sensation intact to light touch  Skin: Skin is warm and dry.  Psychiatric: He has a normal mood and affect. His behavior is normal.    LabResultsLast24Hours  No results found for this or any previous visit (from the past 24 hour(s)).   ImagingResults(Last48hours)  No results found.    Assessment/Plan: Diagnosis: Recurrent SDH Labs and images independently reviewed.  Records reviewed and summated above.  1. Does the need for close, 24 hr/day medical supervision in concert with the patient's rehab needs make it unreasonable for this patient to be served in a less intensive setting? Potentially  2. Co-Morbidities requiring supervision/potential complications:  OSA (monitor for sign/symptoms of daytime somnolence and fatigue), [name] (cont meds), [name], CAF (monitor HR with increased activity),  multiple SDH, tachypnea (monitor RR and O2 Sats with increased physical exertion), steroid induced hyperglycemia (Monitor in accordance with exercise and adjust meds as necessary), hyponatremia (cont to monitor, treat if necessary), ABLA (transfuse if necessary to ensure appropriate perfusion for increased activity tolerance), Thrombocytopenia (< 60,000/mm3 no resistive exercise) 3. Due to safety, skin/wound care, disease management and patient education, does the patient require 24 hr/day rehab nursing? Potentially 4. Does the patient require coordinated care of a physician, rehab nurse, PT (1-2 hrs/day, 5 days/week) and OT (1-2 hrs/day, 5 days/week) to address physical and functional deficits in  the context of the above medical diagnosis(es)? Potentially Addressing deficits in the following areas: balance, endurance, locomotion, strength, transferring, bathing, dressing, toileting and psychosocial support 5. Can the patient actively participate in an intensive therapy program of at least 3 hrs of therapy per day at least 5 days per week? Yes 6. The potential for patient to make measurable gains while on inpatient rehab is good 7. Anticipated functional outcomes upon discharge from inpatient rehab are supervision  with PT, supervision with OT, n/a with SLP. 8. Estimated rehab length of stay to reach the above functional goals is: 6-9 days. 9. Anticipated D/C setting: Home 10. Anticipated post D/C treatments: HH therapy and Home excercise program 11. Overall Rehab/Functional Prognosis: good  RECOMMENDATIONS: This patient's condition is appropriate for continued rehabilitative care in the following setting: Will await PT eval, however, patient appears to be functionally improving.  Anticipate he will at his baseline level of functioning soon and will not require CIR. Patient has agreed to participate in recommended program. Potentially Note that insurance prior authorization may be required for reimbursement for recommended care.  Comment: Rehab Admissions Coordinator to follow up.   I have personally performed a face to face diagnostic evaluation, including, but not limited to relevant history and physical exam findings, of this patient and developed relevant assessment and plan.  Additionally, I have reviewed and concur with the physician assistant's documentation above.   Delice Lesch, MD, ABPMR Bary Leriche, PA-C 03/23/2018      Revision History                                  Routing History

## 2018-03-26 ENCOUNTER — Inpatient Hospital Stay (HOSPITAL_COMMUNITY): Payer: Medicare Other | Admitting: Speech Pathology

## 2018-03-26 ENCOUNTER — Inpatient Hospital Stay (HOSPITAL_COMMUNITY): Payer: Medicare Other | Admitting: Occupational Therapy

## 2018-03-26 ENCOUNTER — Inpatient Hospital Stay (HOSPITAL_COMMUNITY): Payer: Medicare Other

## 2018-03-26 ENCOUNTER — Inpatient Hospital Stay (HOSPITAL_COMMUNITY): Payer: Medicare Other | Admitting: Physical Therapy

## 2018-03-26 DIAGNOSIS — I6200 Nontraumatic subdural hemorrhage, unspecified: Secondary | ICD-10-CM

## 2018-03-26 DIAGNOSIS — E222 Syndrome of inappropriate secretion of antidiuretic hormone: Secondary | ICD-10-CM

## 2018-03-26 DIAGNOSIS — G8191 Hemiplegia, unspecified affecting right dominant side: Secondary | ICD-10-CM

## 2018-03-26 DIAGNOSIS — Z9889 Other specified postprocedural states: Secondary | ICD-10-CM

## 2018-03-26 LAB — COMPREHENSIVE METABOLIC PANEL
ALT: 18 U/L (ref 17–63)
AST: 16 U/L (ref 15–41)
Albumin: 2.7 g/dL — ABNORMAL LOW (ref 3.5–5.0)
Alkaline Phosphatase: 56 U/L (ref 38–126)
Anion gap: 9 (ref 5–15)
BUN: 10 mg/dL (ref 6–20)
CO2: 27 mmol/L (ref 22–32)
Calcium: 8.5 mg/dL — ABNORMAL LOW (ref 8.9–10.3)
Chloride: 93 mmol/L — ABNORMAL LOW (ref 101–111)
Creatinine, Ser: 0.66 mg/dL (ref 0.61–1.24)
GFR calc Af Amer: >60 mL/min (ref >60)
GFR calc non Af Amer: >60 mL/min (ref >60)
Glucose, Bld: 109 mg/dL — ABNORMAL HIGH (ref 65–99)
Potassium: 3.9 mmol/L (ref 3.5–5.1)
Sodium: 129 mmol/L — ABNORMAL LOW (ref 135–145)
Total Bilirubin: 0.6 mg/dL (ref 0.3–1.2)
Total Protein: 5.3 g/dL — ABNORMAL LOW (ref 6.5–8.1)

## 2018-03-26 LAB — CBC WITH DIFFERENTIAL/PLATELET
Abs Immature Granulocytes: 0 10*3/uL (ref 0.0–0.1)
Basophils Absolute: 0 10*3/uL (ref 0.0–0.1)
Basophils Relative: 0 %
Eosinophils Absolute: 0.5 10*3/uL (ref 0.0–0.7)
Eosinophils Relative: 7 %
HCT: 36.6 % — ABNORMAL LOW (ref 39.0–52.0)
Hemoglobin: 12.5 g/dL — ABNORMAL LOW (ref 13.0–17.0)
Immature Granulocytes: 1 %
Lymphocytes Relative: 28 %
Lymphs Abs: 2 10*3/uL (ref 0.7–4.0)
MCH: 30.3 pg (ref 26.0–34.0)
MCHC: 34.2 g/dL (ref 30.0–36.0)
MCV: 88.8 fL (ref 78.0–100.0)
Monocytes Absolute: 1.2 10*3/uL — ABNORMAL HIGH (ref 0.1–1.0)
Monocytes Relative: 16 %
Neutro Abs: 3.4 10*3/uL (ref 1.7–7.7)
Neutrophils Relative %: 48 %
Platelets: 184 10*3/uL (ref 150–400)
RBC: 4.12 MIL/uL — ABNORMAL LOW (ref 4.22–5.81)
RDW: 13.2 % (ref 11.5–15.5)
WBC: 7.1 10*3/uL (ref 4.0–10.5)

## 2018-03-26 NOTE — Evaluation (Addendum)
Physical Therapy Assessment and Plan  Patient Details  Name: Ryan Savage MRN: [id number] Date of Birth: [birth year]  PT Diagnosis: Abnormal posture, Abnormality of gait, Coordination disorder, Difficulty walking, Hemiplegia (R UE/LE), Impaired cognition, Impaired sensation and Muscle weakness Rehab Potential: Fair ELOS: 1-2 weeks   Today's Date: 03/26/2018 PT Individual Time: 8127-5170 and 0174-9449 PT Individual Time Calculation (min): 58 min and 23 min    Problem List:  Patient Active Problem List   Diagnosis Date Noted  . Subdural hemorrhage ([place]) 03/25/2018  . Subdural hematoma without coma ([place])   . Chronic systolic CHF (congestive heart failure) ([place])   . Seizure prophylaxis   . Insomnia   . PAF (paroxysmal atrial fibrillation) ([place])   . Thrombocytopenia ([place])   . History of subdural hematoma   . Tachypnea   . 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 Protein-calorie malnutrition, severe 03/20/2018  . Traumatic subdural hematoma ([place]) 02/24/2018  . Acute blood loss anemia   . Steroid-induced hyperglycemia   . 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 Chronic systolic CHF (congestive heart failure), NYHA class 2 ([place]) 02/20/2018  . Hypertension 02/20/2018  . Chronic atrial fibrillation ([place]) 02/20/2018  . Coronary artery disease 02/20/2018  . Mild mitral valve regurgitation 02/20/2018  . Subdural hematoma ([place]) 02/20/2018  . [name] (coronary artery disease), native coronary artery   . SDH (subdural hematoma) ([place]) 01/25/2018  . Bilateral subdural hematomas ([place]) 01/23/2018  . Malnutrition of moderate degree 12/10/2017  . Cerebral embolism with cerebral infarction 12/09/2017  . Odynophagia 12/08/2017  . Syncope and collapse 12/08/2017  . Hyperlipidemia 12/08/2017  . OSA (obstructive sleep apnea) 12/08/2017  . Dysarthria 12/08/2017  . [name] (coronary artery disease) 12/08/2017  . Chronic systolic heart failure ([place]) 12/08/2017  . Syndrome of inappropriate  ADH (SIADH) secretion ([place]) 12/08/2017  . SIADH (syndrome of inappropriate ADH production) ([place])   . Subclinical hyperthyroidism   . Hyponatremia with excess extracellular fluid volume 11/18/2017  . Hyperglycemia 11/18/2017  . Supratherapeutic INR 11/18/2017  . Mitral valve prolapse   . Permanent atrial fibrillation ([place]) 11/08/2013  . Benign essential HTN 11/08/2013  . Mitral regurgitation 11/08/2013    Past Medical History:  Past Medical History:  Diagnosis Date  . Atrial fibrillation, chronic (HCC)    Not on anticoagulation due to history of bilateral subdural bleeds due to recurrent falls  . BPH (benign prostatic hypertrophy)   . [name] (coronary artery disease), native coronary artery    Severe two-vessel [name] with chronically occluded LAD and RCA with widely patent left main, intermediate, and left circumflex branches.  On medical management.  He has extensive collaterals.  . 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 fib with RVR  . [name] (degenerative joint disease)   . GERD (gastroesophageal reflux disease)   . Hyperlipidemia    LDL goal less then 100  . Hypertension   . Microscopic hematuria    [name]  . Mitral valve prolapse    with moderate MR  . Myocardial infarction [facility])    "was told 11/2017 that he'd had a heart attack; don't know when it was" (02/19/2018)  . OSA (obstructive sleep apnea)    intolerant with CPAP (02/19/2018)  . SDH (subdural hematoma) ([place]) 01/2018  . SIADH (syndrome of inappropriate ADH production) ([place])   . Skin cancer    "side  of my nose" (11/19/2017)  . Stroke ([place]) 12/2017   "fully recovered"  . Thrombocytopenia (HCC)    Mild- platelet count 143,000 on 02/2011, stable 08/2011   Past Surgical History:  Past Surgical History:  Procedure Laterality Date  . APPENDECTOMY  1950  . BACK SURGERY     . BURR HOLE Bilateral 01/25/2018   Procedure: Haskell Flirt;  Surgeon: Kary Kos, MD;  Location: [place];  Service: Neurosurgery;  Laterality: Bilateral;  . BURR HOLE Left 02/20/2018   Procedure: BURR HOLES for subdural hematoma;  Surgeon: Newman Pies, MD;  Location: [place];  Service: Neurosurgery;  Laterality: Left;  . BURR HOLE Left 03/20/2018   Procedure: Craniotomy for subdural hematoma;  Surgeon: Kristeen Miss, MD;  Location: [place];  Service: Neurosurgery;  Laterality: Left;  . FOREARM FRACTURE SURGERY Right 1954  . LUMBAR DISC SURGERY  1959   ruptured disc repair  . RIGHT/LEFT HEART CATH AND CORONARY ANGIOGRAPHY N/A 11/21/2017   Procedure: RIGHT/LEFT HEART CATH AND CORONARY ANGIOGRAPHY;  Surgeon: Sherren Mocha, MD;  Location: [place] [facility];  Service: Cardiovascular;  Laterality: N/A;  . SKIN CANCER EXCISION Left    "side of my nose"    Assessment & Plan Clinical Impression: Patient is a 84 y.o. year old male with history of A fib, [name], chronic systolic CHF, hyponatremia, subdural hemorrhage with recurrence and craniotomy x2 most recent February 19, 2018. He  was readmitted via ED on 03/19/2018 with right-sided weakness and falls.  Follow-up CT head showed reaccumulation of left subdural hemorrhage with left right shift and subfalcine herniation.  He was taken back to the OR for left craniotomy for evacuation of subdural hemorrhage by Dr. Ellene Route.   Follow-up CT head 5/13 reviewed, showing stable/slight improvement.  Per report, large amount of pneumocephalus, decrease in tension effect on left frontal pole and persistent right sub-falcine translation-- slightly improved.  Patient is neurologically stable and is showing improvement in activity.  MD recommended  CIR due to functional deficits.  Patient transferred to CIR on 03/25/2018 .   Patient currently requires min with mobility secondary to muscle weakness, decreased cardiorespiratory endurance, decreased coordination, decreased attention,  decreased awareness, decreased problem solving, decreased safety awareness, decreased memory and delayed processing, and decreased standing balance, decreased postural control, hemiplegia and decreased balance strategies.  Prior to hospitalization, patient was supervision with RW with mobility and lived with Spouse in a House home.  Home access is 3+3Stairs to enter.  Patient will benefit from skilled PT intervention to maximize safe functional mobility, minimize fall risk and decrease caregiver burden for planned discharge home with 24 hour assist.  Anticipate patient will benefit from follow up [facility] at discharge.  PT - End of Session Activity Tolerance: Decreased this session Endurance Deficit: Yes Endurance Deficit Description: generalized weakness & decreased endurance PT Assessment Rehab Potential (ACUTE/IP ONLY): Fair PT Barriers to Discharge: Decreased caregiver support PT Barriers to Discharge Comments: unsure of pt's wife's ability to provide necessary 24 hr supervision PT Patient demonstrates impairments in the following area(s): Balance;Behavior;Endurance;Motor;Safety;Sensory PT Transfers Functional Problem(s): Bed Mobility;Bed to Chair;Car;Furniture PT Locomotion Functional Problem(s): Ambulation;Wheelchair Mobility;Stairs PT Plan PT Intensity: Minimum of 1-2 x/day ,45 to 90 minutes PT Frequency: 5 out of 7 days PT Duration Estimated Length of Stay: 1-2 weeks PT Treatment/Interventions: Ambulation/gait training;DME/adaptive equipment instruction;Community reintegration;Neuromuscular re-education;Psychosocial support;Stair training;UE/LE Strength taining/ROM;Wheelchair propulsion/positioning;UE/LE Coordination activities;Therapeutic Activities;Skin care/wound management;Pain management;Discharge planning;Balance/vestibular training;Cognitive remediation/compensation;Disease management/prevention;Functional mobility training;Patient/family education;Therapeutic Exercise;Visual/perceptual  remediation/compensation;Splinting/orthotics PT Transfers Anticipated Outcome(s): supervision with LRAD PT  Locomotion Anticipated Outcome(s): supervision with LRAD PT Recommendation Recommendations for Other Services: Speech consult;Therapeutic Recreation consult Therapeutic Recreation Interventions: Stress management Follow Up Recommendations: Home health PT;24 hour supervision/assistance Patient destination: Home Equipment Recommended: To be determined  Skilled Therapeutic Intervention Treatment 1: Pt received sitting on EOB & agreeable to tx. No c/o pain reported. PT evaluation initiated with therapist educating pt on daily therapy schedule & weekly interdisciplinary team meetings. Pt requesting to get dressed and required assistance to retrieve clothing. Pt required max cuing to achieve figure-four position with BLE to assist with threading clothing on BLE but ultimately required assistance to thread underwear and pants on BLE. Pt required min/mod assist for standing balance while pulling clothing over hips. Pt required extra time to don shirt and therapist provided total assist for donning belt for time management. Pt completed sit>stand transfers with min/mod assist and stand pivot bed>w/c with min assist and cuing for sequencing. Pt completes car transfer & with min assist and ambulates short distances without AD and heavy min assist. At end of session pt left in handoff to SLP. Please see above & below for additional details. Addendum: Provided pt with 18x18 w/c for increased postioning & comfort when OOB.    Treatment 2: Pt received in room with wife Enid Derry) present. No c/o pain reported and pt frequently nodding off to sleep when wife explaining events leading to re-admission. Session focused on education with therapist educating them on daily therapy schedule & weekly interdisciplinary team meeting. Provided wife with home measurement sheet to complete in the event pt needs to d/c home at  w/c level for increased safety & decreased level of care for wife. Educated them on need for 24 hr supervision (wife beside pt at all times if he was not sitting down) with wife appearing surprised over this information. Also educated them on ELOS & physical & cognitive effects caused by SDH. At end of session pt left sitting in w/c in room with all needs within reach, wife present, & quick release belt donned.  PT Evaluation Precautions/Restrictions Precautions Precautions: Fall Restrictions Weight Bearing Restrictions: No  General Chart reviewed: yes Additional pertinent history: a fib, [name], CHF, prior CVA, [name], BPH, HLD, mitral valve prolapse, MI, OSA, recurrent SDH's and crani x 2 Response to previous treatment: n/a Family/caregiver present: no  Vital Signs Therapy Vitals Pulse Rate: 77 BP: 119/76 Patient Position (if appropriate): Sitting  Home Living/Prior Functioning Home Living Available Help at Discharge: Family;Available 24 hours/day Type of Home: House(ranch style) Home Access: Stairs to enter Entrance Stairs-Number of Steps: 3+3 Entrance Stairs-Rails: (1 rail at a time, L + R) Home Layout: One level  Lives With: Spouse(Shirley) Prior Function Level of Independence: (pt d/c from CIR <month ago with recommendations to use RW at all times but pt reports furniture walking)  Able to Take Stairs?: Yes Driving: No   Vision/Perception  Pt reports wearing glasses all the time at baseline; pt denies changes in baseline vision.  Cognition Overall Cognitive Status: History of cognitive impairments - at baseline Arousal/Alertness: Awake/alert Orientation Level: Oriented X4 Memory: Impaired Memory Impairment: Decreased recall of new information Decreased Short Term Memory: Verbal basic;Functional basic Awareness: Impaired Awareness Impairment: Emergent impairment;Anticipatory impairment Problem Solving: Impaired Problem Solving Impairment: Functional basic;Verbal  basic Self Monitoring: Impaired Behaviors: Impulsive Safety/Judgment: Impaired Comments: Decreaed safety awareness and decreaed awareness of deficits  Sensation Pt reports numbness in toes of B feet.  Motor  Motor Motor: Other (comment) Motor - Skilled Clinical Observations: Generalized weakness  and deconditioning Motor - Discharge Observations: R mild hemi, prior R SDH affecting L side, global weakness   Mobility Transfers Transfers: Yes Sit to Stand: 3: Mod assist Sit to Stand Details: Verbal cues for technique;Manual facilitation for weight shifting Stand Pivot Transfers: 4: Min assist Stand Pivot Transfer Details: Verbal cues for sequencing;Verbal cues for technique;Tactile cues for sequencing;Tactile cues for weight shifting;Tactile cues for placement   Locomotion  Ambulation Ambulation: Yes Ambulation/Gait Assistance: 4: Min assist Ambulation Distance (Feet): 20 Feet Assistive device: None Gait Gait: Yes Gait Pattern: Impaired Gait Pattern: (decreased step length BLE, decreased stride length, forward trunk flexion, decreased gait speed, decreased weight shifting) Stairs / Additional Locomotion Stairs: No Wheelchair Mobility Wheelchair Mobility: No   Trunk/Postural Assessment  Cervical Assessment Cervical Assessment: Exceptions to WFL(forward head) Thoracic Assessment Thoracic Assessment: (forward trunk) Lumbar Assessment Lumbar Assessment: Exceptions to WFL(Posterior pelvic tilt) Postural Control Postural Control: (delayed reactions & impaired postural control)   Balance Balance Balance Assessed: Yes Dynamic Standing Balance Dynamic Standing - Balance Support: No upper extremity supported Dynamic Standing - Level of Assistance: 4: Min assist Dynamic Standing - Balance Activities: (during gait) Dynamic Standing - Comments: Standing when completing LB clothing management   Extremity Assessment  Per functional assessment 3+/5, BLE weakness RLE>LLE  See  Function Navigator for Current Functional Status.   Refer to Care Plan for Long Term Goals  Recommendations for other services: Therapeutic Recreation  Stress management  Discharge Criteria: Patient will be discharged from PT if patient refuses treatment 3 consecutive times without medical reason, if treatment goals not met, if there is a change in medical status, if patient makes no progress towards goals or if patient is discharged from hospital.  The above assessment, treatment plan, treatment alternatives and goals were discussed and mutually agreed upon: by patient  [name] 03/26/2018, 5:08 PM

## 2018-03-26 NOTE — Evaluation (Signed)
Speech Language Pathology Assessment and Plan  Patient Details  Name: Ryan Savage MRN: [id number] Date of Birth: 12-07-1928  SLP Diagnosis: Cognitive Impairments  Rehab Potential: Good ELOS: 2 weeks     Today's Date: 03/26/2018 SLP Individual Time: 0263-7858 SLP Individual Time Calculation (min): 54 min   Problem List:  Patient Active Problem List   Diagnosis Date Noted  . Subdural hemorrhage ([place]) 03/25/2018  . Subdural hematoma without coma ([place])   . Chronic systolic CHF (congestive heart failure) ([place])   . Seizure prophylaxis   . Insomnia   . PAF (paroxysmal atrial fibrillation) ([place])   . Thrombocytopenia ([place])   . History of subdural hematoma   . Tachypnea   . 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 Protein-calorie malnutrition, severe 03/20/2018  . Traumatic subdural hematoma ([place]) 02/24/2018  . Acute blood loss anemia   . Steroid-induced hyperglycemia   . 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 Chronic systolic CHF (congestive heart failure), NYHA class 2 ([place]) 02/20/2018  . Hypertension 02/20/2018  . Chronic atrial fibrillation ([place]) 02/20/2018  . Coronary artery disease 02/20/2018  . Mild mitral valve regurgitation 02/20/2018  . Subdural hematoma ([place]) 02/20/2018  . [name] (coronary artery disease), native coronary artery   . SDH (subdural hematoma) ([place]) 01/25/2018  . Bilateral subdural hematomas ([place]) 01/23/2018  . Malnutrition of moderate degree 12/10/2017  . Cerebral embolism with cerebral infarction 12/09/2017  . Odynophagia 12/08/2017  . Syncope and collapse 12/08/2017  . Hyperlipidemia 12/08/2017  . OSA (obstructive sleep apnea) 12/08/2017  . Dysarthria 12/08/2017  . [name] (coronary artery disease) 12/08/2017  . Chronic systolic heart failure ([place]) 12/08/2017  . Syndrome of inappropriate ADH (SIADH) secretion ([place]) 12/08/2017  . SIADH (syndrome of inappropriate ADH production) ([place])   . Subclinical hyperthyroidism   . Hyponatremia  with excess extracellular fluid volume 11/18/2017  . Hyperglycemia 11/18/2017  . Supratherapeutic INR 11/18/2017  . Mitral valve prolapse   . Permanent atrial fibrillation ([place]) 11/08/2013  . Benign essential HTN 11/08/2013  . Mitral regurgitation 11/08/2013   Past Medical History:  Past Medical History:  Diagnosis Date  . Atrial fibrillation, chronic (HCC)    Not on anticoagulation due to history of bilateral subdural bleeds due to recurrent falls  . BPH (benign prostatic hypertrophy)   . [name] (coronary artery disease), native coronary artery    Severe two-vessel [name] with chronically occluded LAD and RCA with widely patent left main, intermediate, and left circumflex branches.  On medical management.  He has extensive collaterals.  . 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 fib with RVR  . [name] (degenerative joint disease)   . GERD (gastroesophageal reflux disease)   . Hyperlipidemia    LDL goal less then 100  . Hypertension   . Microscopic hematuria    [name]  . Mitral valve prolapse    with moderate MR  . Myocardial infarction [facility])    "was told 11/2017 that he'd had a heart attack; don't know when it was" (02/19/2018)  . OSA (obstructive sleep apnea)    intolerant with CPAP (02/19/2018)  . SDH (subdural hematoma) ([place]) 01/2018  . SIADH (syndrome of inappropriate ADH production) ([place])   . Skin cancer    "side of my nose" (11/19/2017)  . Stroke ([place]) 12/2017   "fully recovered"  . Thrombocytopenia ([place])  Mild- platelet count 143,000 on 02/2011, stable 08/2011   Past Surgical History:  Past Surgical History:  Procedure Laterality Date  . APPENDECTOMY  1950  . BACK SURGERY    . BURR HOLE Bilateral 01/25/2018   Procedure: Haskell Flirt;  Surgeon: Kary Kos, MD;  Location: [facility];  Service: Neurosurgery;  Laterality: Bilateral;   . BURR HOLE Left 02/20/2018   Procedure: BURR HOLES for subdural hematoma;  Surgeon: Newman Pies, MD;  Location: [place];  Service: Neurosurgery;  Laterality: Left;  . BURR HOLE Left 03/20/2018   Procedure: Craniotomy for subdural hematoma;  Surgeon: Kristeen Miss, MD;  Location: [facility];  Service: Neurosurgery;  Laterality: Left;  . FOREARM FRACTURE SURGERY Right 1954  . LUMBAR DISC SURGERY  1959   ruptured disc repair  . RIGHT/LEFT HEART CATH AND CORONARY ANGIOGRAPHY N/A 11/21/2017   Procedure: RIGHT/LEFT HEART CATH AND CORONARY ANGIOGRAPHY;  Surgeon: Sherren Mocha, MD;  Location: [place] [facility];  Service: Cardiovascular;  Laterality: N/A;  . SKIN CANCER EXCISION Left    "side of my nose"    Assessment / Plan / Recommendation Clinical Impression Patient is an 84 year old male with history of A fib, [name], chronic systolic CHF, hyponatremia, subdural hemorrhage with recurrence and craniotomy x2 most recent February 19, 2018. He  was readmitted via ED on 03/19/2018 with right-sided weakness and falls.  Follow-up CT head showed reaccumulation of left subdural hemorrhage with left right shift and subfalcine herniation.  He was taken back to the OR for left craniotomy for evacuation of subdural hemorrhage by Dr. Ellene Route.   Follow-up CT head 5/13 reviewed, showing stable/slight improvement.  Per report, large amount of pneumocephalus, decrease in tension effect on left frontal pole and persistent right sub-falcine translation-- slightly improved.  Patient is neurologically stable and is showing improvement in activity.  MD recommended  CIR due to functional deficits. Patient transferred to CIR on 03/25/2018 .    Patient demonstrates mild-moderate cognitive impairments impacting problem solving, awareness, recall and overall safety with functional and familiar tasks. Patient has a h/o of baseline cognitive impairments, however, no family present to confirm baseline. Patient would benefit from skilled SLP  intervention to maximize his cognitive function and overall functional independence prior to discharge.    Skilled Therapeutic Interventions          Administered a cognitive-linguistic evaluation. Please see above for details. Educated patient in regards to his current cognitive functioning and goals of skilled SLP intervention. He verbalized understanding and agreement.   SLP Assessment  Patient will need skilled [place] Pathology Services during CIR admission    Recommendations  Oral Care Recommendations: Oral care BID Recommendations for Other Services: Neuropsych consult Patient destination: Home Follow up Recommendations: Home Health SLP;24 hour supervision/assistance Equipment Recommended: None recommended by SLP    SLP Frequency 3 to 5 out of 7 days   SLP Duration  SLP Intensity  SLP Treatment/Interventions 2 weeks   Minumum of 1-2 x/day, 30 to 90 minutes  Cognitive remediation/compensation;Cueing hierarchy;Internal/external aids;Environmental controls;Functional tasks;Patient/family education    Pain No/Denies Pain   Prior Functioning Type of Home: House  Lives With: Spouse Available Help at Discharge: Family;Available 24 hours/day Vocation: Retired  Function:   Cognition Comprehension Comprehension assist level: Follows basic conversation/direction with extra time/assistive device  Expression   Expression assist level: Expresses basic 90% of the time/requires cueing < 10% of the time.  Social Interaction Social Interaction assist level: Interacts appropriately with others with medication or extra  time (anti-anxiety, antidepressant).  Problem Solving Problem solving assist level: Solves basic 50 - 74% of the time/requires cueing 25 - 49% of the time  Memory Memory assist level: Recognizes or recalls 75 - 89% of the time/requires cueing 10 - 24% of the time   Short Term Goals: Week 1: SLP Short Term Goal 1 (Week 1): Patient will demonstrate functional  problem solving for functional and familiar tasks with Min A verbal cues.  SLP Short Term Goal 2 (Week 1): Patient will utilize external aids to recall new, daily information with Min A verbal cues.  SLP Short Term Goal 3 (Week 1): Patient will self-monitor and correct errors during functional tasks with Min A verbal cues.   Refer to Care Plan for Long Term Goals  Recommendations for other services: Neuropsych  Discharge Criteria: Patient will be discharged from SLP if patient refuses treatment 3 consecutive times without medical reason, if treatment goals not met, if there is a change in medical status, if patient makes no progress towards goals or if patient is discharged from hospital.  The above assessment, treatment plan, treatment alternatives and goals were discussed and mutually agreed upon: by patient  Mikaylah Libbey 03/26/2018, 4:11 PM

## 2018-03-26 NOTE — Care Management Note (Signed)
Inpatient [facility] Individual Statement of Services  Patient Name:  Ryan Savage  Date:  03/26/2018  Welcome to the [facility].  Our goal is to provide you with an individualized program based on your diagnosis and situation, designed to meet your specific needs.  With this comprehensive rehabilitation program, you will be expected to participate in at least 3 hours of rehabilitation therapies Monday-Friday, with modified therapy programming on the weekends.  Your rehabilitation program will include the following services:  Physical Therapy (PT), Occupational Therapy (OT), Speech Therapy (ST), 24 hour per day rehabilitation nursing, Case Management (Social Worker), Rehabilitation Medicine, Nutrition Services and Pharmacy Services  Weekly team conferences will be held on Wednesday to discuss your progress.  Your Social Worker will talk with you frequently to get your input and to update you on team discussions.  Team conferences with you and your family in attendance may also be held.  Expected length of stay: 2 weeks  Overall anticipated outcome: supervision-min-ambulation  Depending on your progress and recovery, your program may change. Your Social Worker will coordinate services and will keep you informed of any changes. Your Social Worker's name and contact numbers are listed  below.  The following services may also be recommended but are not provided by the [facility]:    [facility] will be made to provide these services after discharge if needed.  Arrangements include referral to agencies that provide these services.  Your insurance has been verified to be: [employer]-Medicare  Your primary doctor is: Radio broadcast assistant  Pertinent information will be shared with your doctor and your insurance company.  Social Worker:  Ovidio Kin, [place] or ([phone]  Information discussed with and copy given to patient by: Elease Hashimoto, 03/26/2018, 10:27 AM

## 2018-03-26 NOTE — Evaluation (Signed)
Occupational Therapy Assessment and Plan  Patient Details  Name: Ryan Savage MRN: [id number] Date of Birth: 1929/08/06  OT Diagnosis: cognitive deficits and muscle weakness (generalized) Rehab Potential: Rehab Potential (ACUTE ONLY): Good ELOS: 2 weeks   Today's Date: 03/26/2018 OT Individual Time: 1400-1500 OT Individual Time Calculation (min): 60 min     Problem List:  Patient Active Problem List   Diagnosis Date Noted  . Subdural hemorrhage ([place]) 03/25/2018  . Subdural hematoma without coma ([place])   . Chronic systolic CHF (congestive heart failure) ([place])   . Seizure prophylaxis   . Insomnia   . PAF (paroxysmal atrial fibrillation) ([place])   . Thrombocytopenia ([place])   . History of subdural hematoma   . Tachypnea   . 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 Protein-calorie malnutrition, severe 03/20/2018  . Traumatic subdural hematoma ([place]) 02/24/2018  . Acute blood loss anemia   . Steroid-induced hyperglycemia   . 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 Chronic systolic CHF (congestive heart failure), NYHA class 2 ([place]) 02/20/2018  . Hypertension 02/20/2018  . Chronic atrial fibrillation ([place]) 02/20/2018  . Coronary artery disease 02/20/2018  . Mild mitral valve regurgitation 02/20/2018  . Subdural hematoma ([place]) 02/20/2018  . [name] (coronary artery disease), native coronary artery   . SDH (subdural hematoma) ([place]) 01/25/2018  . Bilateral subdural hematomas ([place]) 01/23/2018  . Malnutrition of moderate degree 12/10/2017  . Cerebral embolism with cerebral infarction 12/09/2017  . Odynophagia 12/08/2017  . Syncope and collapse 12/08/2017  . Hyperlipidemia 12/08/2017  . OSA (obstructive sleep apnea) 12/08/2017  . Dysarthria 12/08/2017  . [name] (coronary artery disease) 12/08/2017  . Chronic systolic heart failure ([place]) 12/08/2017  . Syndrome of inappropriate ADH (SIADH) secretion ([place]) 12/08/2017  . SIADH (syndrome of inappropriate ADH production) ([place])    . Subclinical hyperthyroidism   . Hyponatremia with excess extracellular fluid volume 11/18/2017  . Hyperglycemia 11/18/2017  . Supratherapeutic INR 11/18/2017  . Mitral valve prolapse   . Permanent atrial fibrillation ([place]) 11/08/2013  . Benign essential HTN 11/08/2013  . Mitral regurgitation 11/08/2013    Past Medical History:  Past Medical History:  Diagnosis Date  . Atrial fibrillation, chronic (HCC)    Not on anticoagulation due to history of bilateral subdural bleeds due to recurrent falls  . BPH (benign prostatic hypertrophy)   . [name] (coronary artery disease), native coronary artery    Severe two-vessel [name] with chronically occluded LAD and RCA with widely patent left main, intermediate, and left circumflex branches.  On medical management.  He has extensive collaterals.  . 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 fib with RVR  . [name] (degenerative joint disease)   . GERD (gastroesophageal reflux disease)   . Hyperlipidemia    LDL goal less then 100  . Hypertension   . Microscopic hematuria    [name]  . Mitral valve prolapse    with moderate MR  . Myocardial infarction [facility])    "was told 11/2017 that he'd had a heart attack; don't know when it was" (02/19/2018)  . OSA (obstructive sleep apnea)    intolerant with CPAP (02/19/2018)  . SDH (subdural hematoma) ([place]) 01/2018  . SIADH (syndrome of inappropriate ADH production) ([place])   . Skin cancer    "side of my nose" (11/19/2017)  . Stroke ([place]) 12/2017   "fully recovered"  .  Thrombocytopenia (HCC)    Mild- platelet count 143,000 on 02/2011, stable 08/2011   Past Surgical History:  Past Surgical History:  Procedure Laterality Date  . APPENDECTOMY  1950  . BACK SURGERY    . BURR HOLE Bilateral 01/25/2018   Procedure: Haskell Flirt;  Surgeon: Kary Kos, MD;  Location: [facility];  Service: Neurosurgery;  Laterality: Bilateral;  . BURR HOLE Left 02/20/2018   Procedure: BURR HOLES for subdural hematoma;  Surgeon: Newman Pies, MD;  Location: [facility];  Service: Neurosurgery;  Laterality: Left;  . BURR HOLE Left 03/20/2018   Procedure: Craniotomy for subdural hematoma;  Surgeon: Kristeen Miss, MD;  Location: [facility];  Service: Neurosurgery;  Laterality: Left;  . FOREARM FRACTURE SURGERY Right 1954  . LUMBAR DISC SURGERY  1959   ruptured disc repair  . RIGHT/LEFT HEART CATH AND CORONARY ANGIOGRAPHY N/A 11/21/2017   Procedure: RIGHT/LEFT HEART CATH AND CORONARY ANGIOGRAPHY;  Surgeon: Sherren Mocha, MD;  Location: [facility];  Service: Cardiovascular;  Laterality: N/A;  . SKIN CANCER EXCISION Left    "side of my nose"    Assessment & Plan Clinical Impression: Ryan Savage is an 84 year old male with history of A fib, [name], chronic systolic CHF, hyponatremia, subdural hemorrhage with recurrence and craniotomy x2 most recent February 19, 2018. He  was readmitted via ED on 03/19/2018 with right-sided weakness and falls.  Follow-up CT head showed reaccumulation of left subdural hemorrhage with left right shift and subfalcine herniation.  He was taken back to the OR for left craniotomy for evacuation of subdural hemorrhage by Dr. Ellene Route.   Follow-up CT head 5/13 reviewed, showing stable/slight improvement.  Per report, large amount of pneumocephalus, decrease in tension effect on left frontal pole and persistent right sub-falcine translation-- slightly improved.  Patient is neurologically stable and is showing improvement in activity.  MD recommended  CIR due to functional deficits. Patient transferred to CIR on 03/25/2018 .    Patient currently requires min- mod with basic self-care skills secondary to muscle weakness, decreased cardiorespiratoy endurance, decreased awareness, decreased problem solving, decreased safety awareness and delayed processing and decreased standing  balance, decreased postural control and decreased balance strategies.  Prior to hospitalization, patient could complete ADLs with supervision.  Patient will benefit from skilled intervention to decrease level of assist with basic self-care skills and increase independence with basic self-care skills prior to discharge home with care partner.  Anticipate patient will require 24 hour supervision and follow up home health.  OT - End of Session Activity Tolerance: Tolerates 10 - 20 min activity with multiple rests Endurance Deficit: Yes Endurance Deficit Description: decreased endurance, fatigue requiring rest breaks throughout seated bathing/dressing sessions OT Assessment Rehab Potential (ACUTE ONLY): Good OT Patient demonstrates impairments in the following area(s): Balance;Cognition;Safety;Endurance;Motor OT Basic ADL's Functional Problem(s): Grooming;Bathing;Dressing;Toileting OT Transfers Functional Problem(s): Toilet;Tub/Shower OT Additional Impairment(s): None OT Plan OT Intensity: Minimum of 1-2 x/day, 45 to 90 minutes OT Frequency: 5 out of 7 days OT Duration/Estimated Length of Stay: 2 weeks OT Treatment/Interventions: Balance/vestibular training;Discharge planning;Pain management;Self Care/advanced ADL retraining;Therapeutic Activities;UE/LE Coordination activities;Therapeutic Exercise;Patient/family education;Functional mobility training;Disease mangement/prevention;Cognitive remediation/compensation;Community reintegration;DME/adaptive equipment instruction;Neuromuscular re-education;Psychosocial support;UE/LE Strength taining/ROM OT Self Feeding Anticipated Outcome(s): Independent OT Basic Self-Care Anticipated Outcome(s): Supervision OT Toileting Anticipated Outcome(s): Supervision OT Bathroom Transfers Anticipated Outcome(s): Supervision- min A OT Recommendation Recommendations for Other Services: Neuropsych consult Patient destination: Home Follow Up Recommendations: Home  health OT Equipment Recommended: None recommended by OT Equipment Details: Pt should have all  needed DME from previous rehab admission   Skilled Therapeutic Intervention Pt seen for OT eval and ADL bathing/dressing session. Pt sitting up in w/c upon arrival, agreeable to tx session and denying pain.  He completed stand pivot transfers throughout session at min-mod A with heavy reliance on UEs to pull up on grab bars.  He bathed seated on tub transfer bench, able to reach towards floor to wash B LEs. He required cuing throughout for safety awareness during bathing/dressing tasks as pt with poor insight into severity of balance deficits.  He returned to w/c to dress, requiring increased time, however, motivated to complete at independent level. He required mod progressing to max A for dynamic standing balance standing at RW to complete LB clothing management.  With significantly increased time and effort, pt able to bend towards floor to don B shoes/socks. Pt left seated in w/c at end of session, QRB donned and all needs in reach. Pt's wife entering room as therapist exited.    OT Evaluation Precautions/Restrictions  Precautions Precautions: Fall Restrictions Weight Bearing Restrictions: No General Chart Reviewed: Yes Home Living/Prior Functioning Home Living Family/patient expects to be discharged to:: Unsure Living Arrangements: Spouse/significant other Available Help at Discharge: Family, Available 24 hours/day Type of Home: House Home Access: Stairs to enter [employer] of Steps: 3+3 Entrance Stairs-Rails: (1 rail at a time, L & R) Home Layout: One level Bathroom Shower/Tub: Gaffer, Curtain, Chiropodist: Standard Bathroom Accessibility: Yes Additional Comments: recent admits each month since January  Lives With: Spouse IADL History Homemaking Responsibilities: No Current License: Yes Mode of Transportation: Musician Occupation:  Retired Prior Function Level of Independence: Needs assistance with ADLs, Needs assistance with homemaking, Requires assistive device for independence(Following d/c for IPR last onth, recommending 24 hr supervision and use of RW at d/c. )  Able to Take Stairs?: Yes Driving: No Vocation: Retired Comments: Pt reports multiple falls and multiple admissions since January  Vision Baseline Vision/History: Wears glasses Wears Glasses: At all times Patient Visual Report: No change from baseline Vision Assessment?: No apparent visual deficits Perception  Perception: Within Functional Limits Praxis Praxis: Intact Cognition Overall Cognitive Status: History of cognitive impairments - at baseline Arousal/Alertness: Awake/alert Orientation Level: Person;Place;Situation Person: Oriented Place: Oriented Situation: Oriented Year: 2019 Month: May Day of Week: Incorrect Memory: Impaired Memory Impairment: Decreased short term memory;Decreased recall of new information Decreased Short Term Memory: Verbal basic;Functional basic Immediate Memory Recall: Sock;Blue;Bed Memory Recall: Sock;Bed Memory Recall Sock: Without Cue Memory Recall Bed: Without Cue Awareness: Impaired Awareness Impairment: Emergent impairment;Anticipatory impairment Problem Solving: Impaired Problem Solving Impairment: Functional basic;Verbal basic Safety/Judgment: Impaired Comments: Decreaed safety awareness and decreaed awareness of deficits Sensation Sensation Light Touch: Appears Intact Proprioception: Appears Intact Coordination Gross Motor Movements are Fluid and Coordinated: No Fine Motor Movements are Fluid and Coordinated: Yes Coordination and Movement Description: Generalized weakness Motor  Motor Motor: Other (comment) Motor - Skilled Clinical Observations: Generalized weakness and deconditioning Trunk/Postural Assessment  Cervical Assessment Cervical Assessment: Exceptions to WFL(Forward  head) Thoracic Assessment Thoracic Assessment: Exceptions to WFL(Rounded shoulders; kyphotic) Lumbar Assessment Lumbar Assessment: Exceptions to WFL(Posterior pelvic tilt) Postural Control Postural Control: Deficits on evaluation(Delayed and insufficent due to deconditioning/ LE weakness)  Balance Balance Balance Assessed: Yes Static Sitting Balance Static Sitting - Balance Support: Feet supported;No upper extremity supported Static Sitting - Level of Assistance: 5: Stand by assistance Dynamic Sitting Balance Dynamic Sitting - Balance Support: During functional activity;Feet supported Dynamic Sitting - Level of Assistance: 5: Stand  by assistance;4: Min assist Sitting balance - Comments: Sitting during bathing/dressing tasks Static Standing Balance Static Standing - Balance Support: During functional activity;Right upper extremity supported;Left upper extremity supported Static Standing - Level of Assistance: 3: Mod assist;4: Min assist Dynamic Standing Balance Dynamic Standing - Balance Support: During functional activity;Right upper extremity supported;Left upper extremity supported Dynamic Standing - Level of Assistance: 3: Mod assist;2: Max assist Dynamic Standing - Comments: Standing when completing LB clothing management Extremity/Trunk Assessment RUE Assessment RUE Assessment: Exceptions to [facility] RUE AROM (degrees) Overall AROM Right Upper Extremity: Within functional limits for tasks performed RUE Strength RUE Overall Strength: (4/5 throughout) LUE Assessment LUE Assessment: Within Functional Limits   See Function Navigator for Current Functional Status.   Refer to Care Plan for Long Term Goals  Recommendations for other services: Neuropsych   Discharge Criteria: Patient will be discharged from OT if patient refuses treatment 3 consecutive times without medical reason, if treatment goals not met, if there is a change in medical status, if patient makes no progress  towards goals or if patient is discharged from hospital.  The above assessment, treatment plan, treatment alternatives and goals were discussed and mutually agreed upon: by patient  Mariann Palo L 03/26/2018, 3:32 PM

## 2018-03-26 NOTE — Progress Notes (Signed)
Social Work  Social Work Assessment and Plan  Patient Details  Name: Ryan Savage MRN: [id number] Date of Birth: [birth year]  Today's Date: 03/26/2018  Problem List:  Patient Active Problem List   Diagnosis Date Noted  . Subdural hemorrhage ([place]) 03/25/2018  . Subdural hematoma without coma ([place])   . Chronic systolic CHF (congestive heart failure) ([place])   . Seizure prophylaxis   . Insomnia   . PAF (paroxysmal atrial fibrillation) ([place])   . Thrombocytopenia ([place])   . History of subdural hematoma   . Tachypnea   . 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 Protein-calorie malnutrition, severe 03/20/2018  . Traumatic subdural hematoma ([place]) 02/24/2018  . Acute blood loss anemia   . Steroid-induced hyperglycemia   . 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 Chronic systolic CHF (congestive heart failure), NYHA class 2 ([place]) 02/20/2018  . Hypertension 02/20/2018  . Chronic atrial fibrillation ([place]) 02/20/2018  . Coronary artery disease 02/20/2018  . Mild mitral valve regurgitation 02/20/2018  . Subdural hematoma ([place]) 02/20/2018  . [name] (coronary artery disease), native coronary artery   . SDH (subdural hematoma) ([place]) 01/25/2018  . Bilateral subdural hematomas ([place]) 01/23/2018  . Malnutrition of moderate degree 12/10/2017  . Cerebral embolism with cerebral infarction 12/09/2017  . Odynophagia 12/08/2017  . Syncope and collapse 12/08/2017  . Hyperlipidemia 12/08/2017  . OSA (obstructive sleep apnea) 12/08/2017  . Dysarthria 12/08/2017  . [name] (coronary artery disease) 12/08/2017  . Chronic systolic heart failure ([place]) 12/08/2017  . Syndrome of inappropriate ADH (SIADH) secretion ([name]) 12/08/2017  . SIADH (syndrome of inappropriate ADH production) ([place])   . Subclinical hyperthyroidism   . Hyponatremia with excess extracellular fluid volume 11/18/2017  . Hyperglycemia 11/18/2017  . Supratherapeutic INR 11/18/2017  . Mitral valve prolapse   . Permanent atrial  fibrillation ([place]) 11/08/2013  . Benign essential HTN 11/08/2013  . Mitral regurgitation 11/08/2013   Past Medical History:  Past Medical History:  Diagnosis Date  . Atrial fibrillation, chronic (HCC)    Not on anticoagulation due to history of bilateral subdural bleeds due to recurrent falls  . BPH (benign prostatic hypertrophy)   . [name] (coronary artery disease), native coronary artery    Severe two-vessel [name] with chronically occluded LAD and RCA with widely patent left main, intermediate, and left circumflex branches.  On medical management.  He has extensive collaterals.  . 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 fib with RVR  . [name] (degenerative joint disease)   . GERD (gastroesophageal reflux disease)   . Hyperlipidemia    LDL goal less then 100  . Hypertension   . Microscopic hematuria    [name]  . Mitral valve prolapse    with moderate MR  . Myocardial infarction [facility])    "was told 11/2017 that he'd had a heart attack; don't know when it was" (02/19/2018)  . OSA (obstructive sleep apnea)    intolerant with CPAP (02/19/2018)  . SDH (subdural hematoma) ([place]) 01/2018  . SIADH (syndrome of inappropriate ADH production) ([place])   . Skin cancer    "side of my nose" (11/19/2017)  . Stroke ([place]) 12/2017   "fully recovered"  . Thrombocytopenia (HCC)    Mild- platelet count 143,000 on 02/2011, stable 08/2011   Past Surgical History:  Past Surgical History:  Procedure Laterality Date  . APPENDECTOMY  1950  . BACK SURGERY    . BURR HOLE Bilateral 01/25/2018   Procedure: Haskell Flirt;  Surgeon: Kary Kos, MD;  Location: [place];  Service: Neurosurgery;  Laterality: Bilateral;  . BURR HOLE Left 02/20/2018   Procedure: BURR HOLES for subdural hematoma;  Surgeon: Newman Pies, MD;  Location: [place];  Service: Neurosurgery;   Laterality: Left;  . BURR HOLE Left 03/20/2018   Procedure: Craniotomy for subdural hematoma;  Surgeon: Kristeen Miss, MD;  Location: [place];  Service: Neurosurgery;  Laterality: Left;  . FOREARM FRACTURE SURGERY Right 1954  . LUMBAR DISC SURGERY  1959   ruptured disc repair  . RIGHT/LEFT HEART CATH AND CORONARY ANGIOGRAPHY N/A 11/21/2017   Procedure: RIGHT/LEFT HEART CATH AND CORONARY ANGIOGRAPHY;  Surgeon: Sherren Mocha, MD;  Location: [facility];  Service: Cardiovascular;  Laterality: N/A;  . SKIN CANCER EXCISION Left    "side of my nose"   Social History:  reports that he quit smoking about 61 years ago. His smoking use included cigarettes. He quit after 3.00 years of use. He has never used smokeless tobacco. He reports that he does not drink alcohol or use drugs.  Family / Support Systems Marital Status: Married Patient Roles: Spouse, Parent Spouse/Significant Other: Enid Derry [phone]  [phone] Children: Cal-son [phone] Other Supports: Friends and church members Anticipated Caregiver: Wife Ability/Limitations of Caregiver: Wife is frail and can only provide supervision level. Son very involved and will check on daily Caregiver Availability: 24/7 Family Dynamics: Close knit family who will do what pt needs. He was doing well a thome prior to this fall and plans to use his walker from now on. Son is here daily and will chekc on them.  Social History Preferred language: English Religion: Baptist Cultural Background: No issues Education: Secretary/administrator educated Read: Yes Write: Yes Employment Status: Retired Freight forwarder Issues: No issues Guardian/Conservator: Pt has a POA-wife and son-MD this time around does not feel he is capable of making his own decisions while here. Will make sure both involved in any decisions needing to be made while here   Abuse/Neglect Abuse/Neglect Assessment Can Be Completed: Yes Physical Abuse: Denies Verbal Abuse:  Denies Sexual Abuse: Denies Exploitation of patient/patient's resources: Denies Self-Neglect: Denies  Emotional Status Pt's affect, behavior adn adjustment status: Pt is back again and plans on using his walker now he was not at home prior to this fall. He recvoered well last time and was doing well at home. He is hopeful he will do so again. Wife does the home management and pays the bills. Pt;s goal is to be independent when he leaves here. Recent Psychosocial Issues: Recent discharge from CIR in April did well. has had many admission since Jan of this year Pyschiatric History: No history doing well and will get input from team, may benefit from seeing neuro-psych while here this time, due to multiple hospitalizations and surgeries. Substance Abuse History: No issues  Patient / Family Perceptions, Expectations & Goals Pt/Family understanding of illness & functional limitations: Pt, son and wife have a good understanding of his surgery and plan going forward. All talk with the MD's and have a good understanding of his condition and are hopeful he will do jsut as well as last time. Premorbid pt/family roles/activities: Husband, father, retiree, freind, church member, etc Anticipated changes in roles/activities/participation: resume Pt/family expectations/goals: Pt states: " I want to do for myself like I always have we will see."  Wife states: " Here we are again,  this time I feel he has learned his lesson and will be more careful, I hope."  [employer]: Other (Comment)([facility]-active pt) Premorbid Home Care/DME Agencies: Other (Comment)(has all DME) Transportation available at discharge: Wife and son Resource referrals recommended: Support group (specify), Neuropsychology  Discharge Planning Living Arrangements: Spouse/significant other Support Systems: Spouse/significant other, Children, Other relatives, Friends/neighbors, Church/faith community Type of Residence:  Private residence Insurance Resources: Multimedia programmer (specify)(UHC-Medicare) Financial Resources: Social Security Financial Screen Referred: No Living Expenses: Own Money Management: Spouse Does the patient have any problems obtaining your medications?: No Home Management: Wife Patient/Family Preliminary Plans: Return home with wfie who is able to provide supervision level and assist with light min. Son is very involved and will be there daily to make sure alright. Aware of CIR process since here in April. Await tehrapy team evaluations. Social Work Anticipated Follow Up Needs: HH/OP, Support Group  Clinical Impression Pleasant gentleman who is back after another fall and craniotomy. His wife and son are very involved and supportive. Pt needs to be supervision level for his wife to manage him at home. Work toward discharge needs and make sure pt always uses his walker for safety.  Elease Hashimoto 03/26/2018, 1:20 PM

## 2018-03-26 NOTE — Plan of Care (Signed)
  Problem: Consults Goal: RH BRAIN INJURY PATIENT EDUCATION Description Description: See Patient Education module for eduction specifics Outcome: Progressing   Problem: RH BLADDER ELIMINATION Goal: RH STG MANAGE BLADDER WITH ASSISTANCE Description STG Manage Bladder With Mod I Assistance  Outcome: Progressing   Problem: RH SKIN INTEGRITY Goal: RH STG SKIN FREE OF INFECTION/BREAKDOWN Description No new breakdown with supervision assist   Outcome: Progressing Goal: RH STG ABLE TO PERFORM INCISION/WOUND CARE W/ASSISTANCE Description STG Able To Perform Incision/Wound Care With Mod I Assistance.  Outcome: Progressing   Problem: RH SAFETY Goal: RH STG ADHERE TO SAFETY PRECAUTIONS W/ASSISTANCE/DEVICE Description STG Adhere to Safety Precautions With Supervision Assistance/Device.  Outcome: Progressing   Problem: RH COGNITION-NURSING Goal: RH STG USES MEMORY AIDS/STRATEGIES W/ASSIST TO PROBLEM SOLVE Description STG Uses Memory Aids/Strategies With Min Assistance to Problem Solve.  Outcome: Progressing

## 2018-03-26 NOTE — Progress Notes (Signed)
Subjective/Complaints:  No issues overnite  Discussed falls at home which per pt occurred while transitioning between walker and WC.  Pt stattes he became weak after the fall on Rig[place] side rather than vice versa  ROS- denies CP, SOB, no N/V/D  Objective: Vital Signs: Blood pressure 122/87, pulse 84, temperature (!) 97.5 F (36.4 C), temperature source Oral, resp. rate 19, height _0  (1.778 m), weight 79 kg (174 lb 2.6 oz), SpO2 99 %. No results found. Results for orders placed or performed during the hospital encounter of 03/25/18 (from the past 72 hour(s))  CBC WITH DIFFERENTIAL     Status: Abnormal   Collection Time: 03/26/18  4:59 AM  Result Value Ref Range   WBC 7.1 4.0 - 10.5 K/uL   RBC 4.12 (L) 4.22 - 5.81 MIL/uL   Hemoglobin 12.5 (L) 13.0 - 17.0 g/dL   HCT 36.6 (L) 39.0 - 52.0 %   MCV 88.8 78.0 - 100.0 fL   MCH 30.3 26.0 - 34.0 pg   MCHC 34.2 30.0 - 36.0 g/dL   RDW 13.2 11.5 - 15.5 %   Platelets 184 150 - 400 K/uL   Neutrophils Relative % 48 %   Neutro Abs 3.4 1.7 - 7.7 K/uL   Lymphocytes Relative 28 %   Lymphs Abs 2.0 0.7 - 4.0 K/uL   Monocytes Relative 16 %   Monocytes Absolute 1.2 (H) 0.1 - 1.0 K/uL   Eosinophils Relative 7 %   Eosinophils Absolute 0.5 0.0 - 0.7 K/uL   Basophils Relative 0 %   Basophils Absolute 0.0 0.0 - 0.1 K/uL   Immature Granulocytes 1 %   Abs Immature Granulocytes 0.0 0.0 - 0.1 K/uL    Comment: Performed at [facility], [address], [place], [place] [address]  Comprehensive metabolic panel     Status: Abnormal   Collection Time: 03/26/18  4:59 AM  Result Value Ref Range   Sodium 129 (L) 135 - 145 mmol/L   Potassium 3.9 3.5 - 5.1 mmol/L   Chloride 93 (L) 101 - 111 mmol/L   CO2 27 22 - 32 mmol/L   Glucose, Bld 109 (H) 65 - 99 mg/dL   BUN 10 6 - 20 mg/dL   Creatinine, Ser 0.66 0.61 - 1.24 mg/dL   Calcium 8.5 (L) 8.9 - 10.3 mg/dL   Total Protein 5.3 (L) 6.5 - 8.1 g/dL   Albumin 2.7 (L) 3.5 - 5.0 g/dL   AST 16 15 - 41 U/L    ALT 18 17 - 63 U/L   Alkaline Phosphatase 56 38 - 126 U/L   Total Bilirubin 0.6 0.3 - 1.2 mg/dL   GFR calc non Af Amer >60 >60 mL/min   GFR calc Af Amer >60 >60 mL/min    Comment: (NOTE) The eGFR has been calculated using the CKD EPI equation. This calculation has not been validated in all clinical situations. eGFR's persistently <60 mL/min signify possible Chronic Kidney Disease.    Anion gap 9 5 - 15    Comment: Performed at [facility] [address], [place], [place] [address]     HEENT: normal Cardio: RRR and no murmur Resp: CTA B/L and unlabored GI: BS positive and NT, ND Extremity:  Pulses positive and No Edema Skin:   Intact Neuro: Alert/Oriented and Abnormal Motor 4/5 RIght delt, Bi, Tri, Grip , HF, KE ADF Musc/Skel:  Other no pain with UE or LE ROM Gen NAD   Assessment/Plan: 1. Functional deficits secondary  to recurrent Left SDH with Right hemiparesis which require 3+ hours per day of interdisciplinary therapy in a comprehensive inpatient rehab setting. Physiatrist is providing close team supervision and 24 hour management of active medical problems listed below. Physiatrist and rehab team continue to assess barriers to discharge/monitor patient progress toward functional and medical goals. FIM:                                   Medical Problem List and Plan: 1.  Deficits with mobility, self-care, and endurance secondary to recurrent left SDH. CIR PT, OT, SLP evals As d/w pt may need WC level goals to avoid falls at home 2.  DVT Prophylaxis/Anticoagulation: Mechanical: Sequential compression devices, below knee Bilateral lower extremities 3. Pain Management: On Neurontin for neuropathic symptoms.  4. Mood: Team to provide ego support. LCSW to follow for evaluation and support.  5. Neuropsych: This patient is not fully  capable of making decisions on his  own behalf. 6. Skin/Wound Care: Monitor wound for healing. Routine pressure  relief measures.  7. Fluids/Electrolytes/Nutrition: Strict I/O. Maintain adequate nutritional support.  BMET normal except Na 129- chronic SIADH 8. PAF: Monitor HR bid. Off anticoagulation due to recurrent bleeds. On metoprolol and lanoxin.  9. [name]/Chronic systolic CHF: Strict I/O. Monitor weights daily. On metoprolol  10. HTN: Monitor BP bid. Continue metoprolol bid.  Vitals:   03/25/18 2029 03/26/18 0554  BP: (!) 136/92 122/87  Pulse: 86 84  Resp: 18 19  Temp: (!) 97.3 F (36.3 C) (!) 97.5 F (36.4 C)  SpO2: 98% 99%  reasonable control 5/16 11. Seizure prophylaxis: NS recommended  Keppra bid for 2-3 months , monitor for sedation 12. Insomnia: Will schedule trazodone early evening to help with sleep hygiene.  13. OSA: CPAP not effective and was schedule to have sleep study. Will order oxygen for use at night prn.     LOS (Days) 1 A FACE TO FACE EVALUATION WAS PERFORMED  Ryan Savage 03/26/2018, 6:49 AM

## 2018-03-27 ENCOUNTER — Inpatient Hospital Stay (HOSPITAL_COMMUNITY): Payer: Medicare Other | Admitting: Occupational Therapy

## 2018-03-27 ENCOUNTER — Inpatient Hospital Stay (HOSPITAL_COMMUNITY): Payer: Medicare Other | Admitting: Speech Pathology

## 2018-03-27 ENCOUNTER — Inpatient Hospital Stay (HOSPITAL_COMMUNITY): Payer: Medicare Other | Admitting: Physical Therapy

## 2018-03-27 MED ORDER — PRO-STAT SUGAR FREE PO LIQD
30.0000 mL | Freq: Two times a day (BID) | ORAL | Status: DC
  Administered 2018-03-27 – 2018-04-04 (×17): 30 mL via ORAL
  Filled 2018-03-27 (×17): qty 30

## 2018-03-27 NOTE — Progress Notes (Signed)
Pt slept throughout the night; awakened by incontinent episodes

## 2018-03-27 NOTE — Progress Notes (Signed)
Occupational Therapy Session Note  Patient Details  Name: Ryan Savage MRN: [id number] Date of Birth: December 21, 1928  Today's Date: 03/27/2018 OT Individual Time: 7121-9758 OT Individual Time Calculation (min): 75 min    Short Term Goals: Week 1:  OT Short Term Goal 1 (Week 1): Pt will complete 3/3 toileting tasks with guarding assist OT Short Term Goal 2 (Week 1): Pt will complete two grooming tasks in standing position with CGA and no seated rest break in order to increase functional activity tolerance OT Short Term Goal 3 (Week 1): Pt will ambulate into bathroom to complete ADLs with CGA OT Short Term Goal 4 (Week 1): Pt will recall proper hand placement on RW during functional transfers consistently at mod I level  Skilled Therapeutic Interventions/Progress Updates:    Pt seen for OT session focusing on ADL re-training. Pt asleep in supine upon arrival, easily awoken and agreeable to tx session. He declined showering this morning opting just to change clothes seated EOB. He transferrd to EOB with guarding assist using hospital bed functions. He dressed seated EOB, R lean with dynamic sitting though pt able to self correct with exception of one total LOB to the R onto bed. He stood with steadying assist at RW to pull pants up. Throughout session, pt required frequent cuing for hand placement on RW, no carry over of technique btwn repetitions. He also required max multi-modal cuing for w/c brake management, difficulty following VCs from therapist for locked vs unlocked position and how to mange. He ambulated to sink and completed 2 grooming tasks in standing with seated rest break provided btwn tasks. Pt tolerating ~2 minutes in standing with UE support before requiring seated rest break, frequent cuing for upright posture. He ambulated into bathroom with min a using RW, steadying assist and cuing while pt completed clothing management. Pt tolerated standing long enough to complete clothing  management following void, ambulate out to sink, wash hands and return to w/c, demonstrating improved functional standing balance/endurance.  He self propelled w/c to therapy day room, VCs and hand over hand technique for effective propulsion technique. Pt ate breakfast in therapy day room for change of scenery. Discussed pt's time at home since d/c from [facility] last month, pt reports 3 falls during that time.  Pt returned to room at end of session, left seated in w/c with QRB donned, all needs in reach and NT entering. Pt disoriented to time and demonstrates confusion and scattered thought process throughout session with attempts to redirect and reorient.   Therapy Documentation Precautions:  Precautions Precautions: Fall Restrictions Weight Bearing Restrictions: No Pain:   No/denies pain  See Function Navigator for Current Functional Status.   Therapy/Group: Individual Therapy  Nyshawn Gowdy L 03/27/2018, 6:16 AM

## 2018-03-27 NOTE — IPOC Note (Signed)
Overall Plan of Care [facility]) Patient Details Name: Elex Mainwaring MRN: [id number] DOB: 02/27/1929  Admitting Diagnosis: <principal problem not specified>  Hospital Problems: Active Problems:   Subdural hemorrhage ([place])     Functional Problem List: Nursing Bladder, Edema, Endurance, Medication Management, Motor, Safety, Skin Integrity  PT Balance, Behavior, Endurance, Motor, Safety, Sensory  OT Balance, Cognition, Safety, Endurance, Motor  SLP Cognition  TR         Basic ADL's: OT Grooming, Bathing, Dressing, Toileting     Advanced  ADL's: OT       Transfers: PT Bed Mobility, Bed to Chair, Car, Manufacturing systems engineer, Metallurgist: PT Ambulation, Emergency planning/management officer, Stairs     Additional Impairments: OT None  SLP Social Cognition   Problem Solving, Memory, Awareness  TR      Anticipated Outcomes Item Anticipated Outcome  Self Feeding Independent  Swallowing      Basic self-care  Supervision  Toileting  Supervision   Bathroom Transfers Supervision- min A  Bowel/Bladder  Mod I assist  Transfers  supervision with LRAD  Locomotion  supervision with LRAD  Communication     Cognition  Supervision   Pain  < 4  Safety/Judgment  Supervision/ min assist   Therapy Plan: PT Intensity: Minimum of 1-2 x/day ,45 to 90 minutes PT Frequency: 5 out of 7 days PT Duration Estimated Length of Stay: 1-2 weeks OT Intensity: Minimum of 1-2 x/day, 45 to 90 minutes OT Frequency: 5 out of 7 days OT Duration/Estimated Length of Stay: 2 weeks SLP Intensity: Minumum of 1-2 x/day, 30 to 90 minutes SLP Frequency: 3 to 5 out of 7 days SLP Duration/Estimated Length of Stay: 2 weeks     Team Interventions: Nursing Interventions Patient/Family Education, Bladder Management, Disease Management/Prevention, Skin Care/Wound Management, Cognitive Remediation/Compensation, Medication Management, Discharge Planning  PT interventions Ambulation/gait training, DME/adaptive  equipment instruction, Community reintegration, Neuromuscular re-education, Psychosocial support, Stair training, UE/LE Strength taining/ROM, Wheelchair propulsion/positioning, UE/LE Coordination activities, Therapeutic Activities, Skin care/wound management, Pain management, Discharge planning, Training and development officer, Cognitive remediation/compensation, Disease management/prevention, Functional mobility training, Patient/family education, Therapeutic Exercise, Visual/perceptual remediation/compensation, Splinting/orthotics  OT Interventions Balance/vestibular training, Discharge planning, Pain management, Self Care/advanced ADL retraining, Therapeutic Activities, UE/LE Coordination activities, Therapeutic Exercise, Patient/family education, Functional mobility training, Disease mangement/prevention, Cognitive remediation/compensation, Community reintegration, Engineer, drilling, Neuromuscular re-education, Psychosocial support, UE/LE Strength taining/ROM  SLP Interventions Cognitive remediation/compensation, English as a second language teacher, Internal/external aids, Environmental controls, Functional tasks, Patient/family education  TR Interventions    SW/CM Interventions Discharge Planning, Psychosocial Support, Patient/Family Education   Barriers to Discharge MD  Medical stability and Behavior  Nursing      PT Decreased caregiver support unsure of pt's wife's ability to provide necessary 24 hr supervision  OT      SLP      SW       Team Discharge Planning: Destination: PT-Home ,OT- Home , SLP-Home Projected Follow-up: PT-Home health PT, 24 hour supervision/assistance, OT-  Home health OT, SLP-Home Health SLP, 24 hour supervision/assistance Projected Equipment Needs: PT-To be determined, OT- None recommended by OT, SLP-None recommended by SLP Equipment Details: PT- , OT-Pt should have all needed DME from previous rehab admission Patient/family involved in discharge planning: PT-  Patient,  OT-Patient, SLP-Patient  MD ELOS: 7-10d Medical Rehab Prognosis:  Good Assessment:  84 year old male with history of A fib, [name], chronic systolic CHF, hyponatremia, subdural hemorrhage with recurrence and craniotomy x2 most recent February 19, 2018. He  was readmitted via  ED on 03/19/2018 with right-sided weakness and falls.  Follow-up CT head showed reaccumulation of left subdural hemorrhage with left right shift and subfalcine herniation.  He was taken back to the OR for left craniotomy for evacuation of subdural hemorrhage by Dr. Ellene Route.   Follow-up CT head 5/13 reviewed, showing stable/slight improvement.  Per report, large amount of pneumocephalus, decrease in tension effect on left frontal pole and persistent right sub-falcine translation-- slightly improved   Now requiring 24/7 Rehab RN,MD, as well as CIR level PT, OT and SLP.  Treatment team will focus on ADLs and mobility with goals set at Sup   See Team Conference Notes for weekly updates to the plan of care

## 2018-03-27 NOTE — Progress Notes (Signed)
Physical Therapy Session Note  Patient Details  Name: Ryan Savage MRN: [id number] Date of Birth: 1929-03-12  Today's Date: 03/27/2018 PT Individual Time: 1130-1225 PT Individual Time Calculation (min): 55 min   Short Term Goals: Week 1:  PT Short Term Goal 1 (Week 1): Pt will negotiate 3 steps + 3 steps with 1 rail & mod assist for home access. PT Short Term Goal 2 (Week 1): Pt will ambulate 75 ft with LRAD & min assist.  Skilled Therapeutic Interventions/Progress Updates:    Pt up in chair upon arrival and agreeable to PT session. States that he wants to work on his legs. Pt able to propel self to gym with supervision and cues for safety when navigating turns and pathfinding. Gait: amb 150 ft X3 using rw and cues for posture and Rt foot clearance with increased fatigue. Active rest breaks included LAQ, ball squeeze, resisted hip abd - each bilaterally 1X10. Standing marching also performed. Transfers: sit<>stand with min assist and cues for sequence. Following session pt returned to room with spouse present. Chair alarm and QRB on and all needs in reach.    Therapy Documentation Precautions:  Precautions Precautions: Fall Restrictions Weight Bearing Restrictions: No    Pain: Pain Assessment Pain Scale: 0-10 Pain Score: 0-No pain   See Function Navigator for Current Functional Status.   Therapy/Group: Individual Therapy  Linard Millers, PT 03/27/2018, 12:45 PM

## 2018-03-27 NOTE — Plan of Care (Signed)
  Problem: Consults Goal: RH BRAIN INJURY PATIENT EDUCATION Description Description: See Patient Education module for eduction specifics Outcome: Progressing   Problem: RH BLADDER ELIMINATION Goal: RH STG MANAGE BLADDER WITH ASSISTANCE Description STG Manage Bladder With Mod I Assistance  Outcome: Progressing   Problem: RH SKIN INTEGRITY Goal: RH STG SKIN FREE OF INFECTION/BREAKDOWN Description No new breakdown with supervision assist   Outcome: Progressing Goal: RH STG ABLE TO PERFORM INCISION/WOUND CARE W/ASSISTANCE Description STG Able To Perform Incision/Wound Care With Mod I Assistance.  Outcome: Progressing   Problem: RH SAFETY Goal: RH STG ADHERE TO SAFETY PRECAUTIONS W/ASSISTANCE/DEVICE Description STG Adhere to Safety Precautions With Supervision Assistance/Device.  Outcome: Progressing   Problem: RH COGNITION-NURSING Goal: RH STG USES MEMORY AIDS/STRATEGIES W/ASSIST TO PROBLEM SOLVE Description STG Uses Memory Aids/Strategies With Min Assistance to Problem Solve.  Outcome: Progressing

## 2018-03-27 NOTE — Progress Notes (Signed)
Subjective/Complaints:  Pt does not remember me from yesterday or prior admit to rehab  No breathing problems reported., pt gets up to 2219m on IS  ROS- denies CP, SOB, no N/V/D  Objective: Vital Signs: Blood pressure 110/76, pulse (!) 57, temperature 97.9 F (36.6 C), temperature source Oral, resp. rate 18, height 5' 10"  (1.778 m), weight 74.4 kg (164 lb 0.4 oz), SpO2 96 %. Dg Chest 2 View  Result Date: 03/26/2018 CLINICAL DATA:  Right-sided weakness.  Recurrent falls. EXAM: CHEST - 2 VIEW COMPARISON:  03/19/2018.  11/18/2017. FINDINGS: Cardiomegaly with mild bilateral interstitial prominence and bilateral pleural effusions consistent CHF. Interstitial prominence slightly more prominent than on prior. No pneumothorax. IMPRESSION: Findings consistent with congestive heart failure with bilateral pulmonary interstitial edema and small pleural effusions. Electronically Signed   By: TMarcello Moores Register   On: 03/26/2018 10:05   Results for orders placed or performed during the hospital encounter of 03/25/18 (from the past 72 hour(s))  CBC WITH DIFFERENTIAL     Status: Abnormal   Collection Time: 03/26/18  4:59 AM  Result Value Ref Range   WBC 7.1 4.0 - 10.5 K/uL   RBC 4.12 (L) 4.22 - 5.81 MIL/uL   Hemoglobin 12.5 (L) 13.0 - 17.0 g/dL   HCT 36.6 (L) 39.0 - 52.0 %   MCV 88.8 78.0 - 100.0 fL   MCH 30.3 26.0 - 34.0 pg   MCHC 34.2 30.0 - 36.0 g/dL   RDW 13.2 11.5 - 15.5 %   Platelets 184 150 - 400 K/uL   Neutrophils Relative % 48 %   Neutro Abs 3.4 1.7 - 7.7 K/uL   Lymphocytes Relative 28 %   Lymphs Abs 2.0 0.7 - 4.0 K/uL   Monocytes Relative 16 %   Monocytes Absolute 1.2 (H) 0.1 - 1.0 K/uL   Eosinophils Relative 7 %   Eosinophils Absolute 0.5 0.0 - 0.7 K/uL   Basophils Relative 0 %   Basophils Absolute 0.0 0.0 - 0.1 K/uL   Immature Granulocytes 1 %   Abs Immature Granulocytes 0.0 0.0 - 0.1 K/uL    Comment: Performed at [facility] [address] [place] [id number]  Comprehensive metabolic panel     Status: Abnormal   Collection Time: 03/26/18  4:59 AM  Result Value Ref Range   Sodium 129 (L) 135 - 145 mmol/L   Potassium 3.9 3.5 - 5.1 mmol/L   Chloride 93 (L) 101 - 111 mmol/L   CO2 27 22 - 32 mmol/L   Glucose, Bld 109 (H) 65 - 99 mg/dL   BUN 10 6 - 20 mg/dL   Creatinine, Ser 0.66 0.61 - 1.24 mg/dL   Calcium 8.5 (L) 8.9 - 10.3 mg/dL   Total Protein 5.3 (L) 6.5 - 8.1 g/dL   Albumin 2.7 (L) 3.5 - 5.0 g/dL   AST 16 15 - 41 U/L   ALT 18 17 - 63 U/L   Alkaline Phosphatase 56 38 - 126 U/L   Total Bilirubin 0.6 0.3 - 1.2 mg/dL   GFR calc non Af Amer >60 >60 mL/min   GFR calc Af Amer >60 >60 mL/min    Comment: (NOTE) The eGFR has been calculated using the CKD EPI equation. This calculation has not been validated in all clinical situations. eGFR's persistently <60 mL/min signify possible Chronic Kidney Disease.    Anion gap 9 5 - 15    Comment: [facility], [place] [id number]  HEENT: normal Cardio: RRR and no murmur Resp: CTA B/L and unlabored, clear GI: BS positive and NT, ND Extremity:  Pulses positive and No Edema Skin:   Intact Neuro: Alert/Oriented and Abnormal Motor 4/5 RIght delt, Bi, Tri, Grip , HF, KE ADF Musc/Skel:  Other no pain with UE or LE ROM Gen NAD   Assessment/Plan: 1. Functional deficits secondary to recurrent Left SDH with Right hemiparesis which require 3+ hours per day of interdisciplinary therapy in a comprehensive inpatient rehab setting. Physiatrist is providing close team supervision and 24 hour management of active medical problems listed below. Physiatrist and rehab team continue to assess barriers to discharge/monitor patient progress toward functional and medical goals. FIM: Function - Bathing Position: Shower Body parts bathed by patient: Right arm, Right lower leg, Left arm, Left lower leg, Chest, Abdomen, Front perineal area, Right upper leg, Left upper  leg Body parts bathed by helper: Buttocks, Back Assist Level: Touching or steadying assistance(Pt > 75%)  Function- Upper Body Dressing/Undressing What is the patient wearing?: Pull over shirt/dress Pull over shirt/dress - Perfomed by patient: Thread/unthread right sleeve, Thread/unthread left sleeve, Pull shirt over trunk Pull over shirt/dress - Perfomed by helper: Put head through opening Button up shirt - Perfomed by helper: Thread/unthread right sleeve, Thread/unthread left sleeve, Pull shirt around back Assist Level: Touching or steadying assistance(Pt > 75%) Function - Lower Body Dressing/Undressing What is the patient wearing?: Underwear, Shoes, Pants Position: Sitting EOB Underwear - Performed by patient: Thread/unthread left underwear leg, Pull underwear up/down Underwear - Performed by helper: Thread/unthread right underwear leg Pants- Performed by patient: Thread/unthread left pants leg, Pull pants up/down, Fasten/unfasten pants Pants- Performed by helper: Thread/unthread right pants leg Socks - Performed by patient: Don/doff right sock, Don/doff left sock Shoes - Performed by patient: Fasten left Shoes - Performed by helper: Don/doff right shoe, Don/doff left shoe, Fasten right Assist for footwear: Partial/moderate assist Assist for lower body dressing: Touching or steadying assistance (Pt > 75%)  Function - Toileting Toileting steps completed by patient: Adjust clothing prior to toileting, Performs perineal hygiene, Adjust clothing after toileting Toileting Assistive Devices: Grab bar or rail Assist level: Touching or steadying assistance (Pt.75%)  Function - Air cabin crew transfer assistive device: Grab bar Assist level to toilet: Moderate assist (Pt 50 - 74%/lift or lower) Assist level from toilet: Moderate assist (Pt 50 - 74%/lift or lower)  Function - Chair/bed transfer Chair/bed transfer method: Stand pivot Chair/bed transfer assist level: Touching or  steadying assistance (Pt > 75%) Chair/bed transfer assistive device: Armrests Chair/bed transfer details: Tactile cues for sequencing, Tactile cues for weight shifting, Tactile cues for placement, Verbal cues for sequencing, Verbal cues for technique  Function - Locomotion: Wheelchair Wheelchair activity did not occur: Safety/medical concerns Wheel 50 feet with 2 turns activity did not occur: Safety/medical concerns Wheel 150 feet activity did not occur: Safety/medical concerns Function - Locomotion: Ambulation Assistive device: No device Max distance: 20 ft Assist level: Touching or steadying assistance (Pt > 75%) Assist level: Touching or steadying assistance (Pt > 75%) Walk 50 feet with 2 turns activity did not occur: Safety/medical concerns Walk 150 feet activity did not occur: Safety/medical concerns Walk 10 feet on uneven surfaces activity did not occur: Safety/medical concerns  Function - Comprehension Comprehension: Auditory Comprehension assist level: Follows basic conversation/direction with extra time/assistive device  Function - Expression Expression: Verbal Expression assist level: Expresses basic 90% of the time/requires cueing < 10% of the time.  Function - Social Interaction Social Interaction  assist level: Interacts appropriately with others with medication or extra time (anti-anxiety, antidepressant).  Function - Problem Solving Problem solving assist level: Solves basic 50 - 74% of the time/requires cueing 25 - 49% of the time  Function - Memory Memory assist level: Recognizes or recalls 75 - 89% of the time/requires cueing 10 - 24% of the time Patient normally able to recall (first 3 days only): Current season, Location of own room, Staff names and faces, That he or she is in a hospital   Medical Problem List and Plan: 1.  Deficits with mobility, self-care, and endurance secondary to recurrent left SDH. CIR PT, OT, SLP evals As d/w pt may need WC level goals  to avoid falls at home 2.  DVT Prophylaxis/Anticoagulation: Mechanical: Sequential compression devices, below knee Bilateral lower extremities 3. Pain Management: On Neurontin for neuropathic symptoms.  4. Mood: Team to provide ego support. LCSW to follow for evaluation and support.  5. Neuropsych: This patient is not fully  capable of making decisions on his  own behalf. 6. Skin/Wound Care: Monitor wound for healing. Routine pressure relief measures.  7. Fluids/Electrolytes/Nutrition: Strict I/O. Maintain adequate nutritional support.  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 PAF: Monitor HR bid. Off anticoagulation due to recurrent bleeds. On metoprolol and lanoxin.  9. [name]/Chronic systolic CHF: Strict I/O. Monitor weights daily. On metoprolol  CXR similar to prior film ~1 wk ago 10. HTN: Monitor BP bid. Continue metoprolol bid.  Vitals:   03/26/18 1406 03/27/18 0818  BP: 133/86 110/76  Pulse: 65 (!) 57  Resp:  18  Temp: 97.9 F (36.6 C)   SpO2: 99% 96%  reasonable control 5/17 11. Seizure prophylaxis: NS recommended  Keppra bid for 2-3 months , monitor for sedation 12. Insomnia: Will schedule trazodone early evening to help with sleep hygiene.  13. OSA: CPAP not effective and was schedule to have sleep study. Will order oxygen for use at night prn.  14.  Hypoalb- prostat   LOS (Days) 2 A FACE TO FACE EVALUATION WAS PERFORMED  Charlett Blake 03/27/2018, 8:41 AM

## 2018-03-27 NOTE — Progress Notes (Signed)
Speech Language Pathology Daily Session Note  Patient Details  Name: Abdulai Blaylock MRN: [id number] Date of Birth: [birth year]  Today's Date: 03/27/2018 SLP Individual Time: 1304-1400 SLP Individual Time Calculation (min): 56 min  Short Term Goals: Week 1: SLP Short Term Goal 1 (Week 1): Patient will demonstrate functional problem solving for functional and familiar tasks with Min A verbal cues.  SLP Short Term Goal 2 (Week 1): Patient will utilize external aids to recall new, daily information with Min A verbal cues.  SLP Short Term Goal 3 (Week 1): Patient will self-monitor and correct errors during functional tasks with Min A verbal cues.   Skilled Therapeutic Interventions:  Pt was seen for skilled ST targeting cognitive goals.  SLP facilitated the session with min-mod assist question cues for recall of activities from previous therapy sessions.  Discussed using a memory notebook with pt and his wife.  Pt was agreeable to keeping a memory notebook to record daily information.  SLP demonstrated techniques to organize memory notebook to make information easy to find.  Pt then recorded 3 goals for recovery in notebook with supervision cues for thought organization and more than a reasonable amount of time.  Pt was returned to room and left in wheelchair with wife at bedside.  Continue per current plan of care.    Function:  Eating Eating                 Cognition Comprehension Comprehension assist level: Follows basic conversation/direction with extra time/assistive device  Expression   Expression assist level: Expresses basic needs/ideas: With extra time/assistive device  Social Interaction Social Interaction assist level: Interacts appropriately with others with medication or extra time (anti-anxiety, antidepressant).  Problem Solving Problem solving assist level: Solves basic 75 - 89% of the time/requires cueing 10 - 24% of the time  Memory Memory assist level: Recognizes or  recalls 50 - 74% of the time/requires cueing 25 - 49% of the time    Pain Pain Assessment Pain Scale: 0-10 Pain Score: 0-No pain  Therapy/Group: Individual Therapy  Irasema Chalk, Selinda Orion 03/27/2018, 2:02 PM

## 2018-03-28 ENCOUNTER — Inpatient Hospital Stay (HOSPITAL_COMMUNITY): Payer: Medicare Other

## 2018-03-28 ENCOUNTER — Inpatient Hospital Stay (HOSPITAL_COMMUNITY): Payer: Medicare Other | Admitting: Physical Therapy

## 2018-03-28 ENCOUNTER — Inpatient Hospital Stay (HOSPITAL_COMMUNITY): Payer: Medicare Other | Admitting: *Deleted

## 2018-03-28 ENCOUNTER — Inpatient Hospital Stay (HOSPITAL_COMMUNITY): Payer: Medicare Other | Admitting: Occupational Therapy

## 2018-03-28 NOTE — Progress Notes (Addendum)
Occupational Therapy Session Note  Patient Details  Name: Ryan Savage MRN: [id number] Date of Birth: 08/31/1929  Today's Date: 03/28/2018 OT Individual Time: 1015-1113; 1440-1458  OT Individual Time Calculation (min): 58 mins; 18 min ;    Short Term Goals: Week 1:  OT Short Term Goal 1 (Week 1): Pt will complete 3/3 toileting tasks with guarding assist OT Short Term Goal 2 (Week 1): Pt will complete two grooming tasks in standing position with CGA and no seated rest break in order to increase functional activity tolerance OT Short Term Goal 3 (Week 1): Pt will ambulate into bathroom to complete ADLs with CGA OT Short Term Goal 4 (Week 1): Pt will recall proper hand placement on RW during functional transfers consistently at mod I level  Skilled Therapeutic Interventions/Progress Updates:    Treatment session 1 focused on ADLs/self care training, transfer training, pt education on safety awareness. Upon entering room, pt agreeable to AM ADLs. He was already dressed and declined a shower but requested to wash up at the sink because he was "rushed this morning." Pt completed chair transfers with walker with min A and v/c for hand/foot placement. Sink side ADLs completed at sit to stand level with continual reminders for hand/foot placement for safety. Pt tolerated standing for several minutes to complete tasks but noted fatigue. Pt required rest breaks throughout bathing task. During buttoning and fastening his pants, pt had difficulty with R hand dexterity and coordination. He required therapist assistance for these tasks. He practiced opening/closing soap, deodorant, and toothpaste containers and was successful with extra time. He declined LB d/b at this time and returned to his chair via walker from sink with min guard for steadiness. Pt was left resting with needs met, chair alarm and QRB applied.   Treatment session 2 focused on AE/AD training, transfer training, and pt education. Pt received  in therapy gym with wife present. OT reviewed sock aide with pt and demo'ed technique. Pt required continual prompts for placement and sequencing with AE with  Mod A. Therapist discussed with pt and wife about use of AE and suggested to continue to practice with it however it is unknown at this time if pt is cognitively capable of learning such task. He was able to reach down and don L shoe with CGA. Pt continues to display inconsistent cognitive recall and carry over with tasks. Completed walk mobility in hallway back to room with CGA and v/c for hand/foot placement. Pt was left resting with needs met, chair alarm and QRB applied.   Therapy Documentation Precautions:  Precautions Precautions: Fall Restrictions Weight Bearing Restrictions: No Pain:    See Function Navigator for Current Functional Status.   Therapy/Group: Individual Therapy     03/28/2018, 3:50 PM  

## 2018-03-28 NOTE — Plan of Care (Signed)
  Problem: Consults Goal: RH BRAIN INJURY PATIENT EDUCATION Description Description: See Patient Education module for eduction specifics Outcome: Progressing   Problem: RH BLADDER ELIMINATION Goal: RH STG MANAGE BLADDER WITH ASSISTANCE Description STG Manage Bladder With Mod I Assistance  Outcome: Progressing   Problem: RH SKIN INTEGRITY Goal: RH STG SKIN FREE OF INFECTION/BREAKDOWN Description No new breakdown with supervision assist   Outcome: Progressing Goal: RH STG ABLE TO PERFORM INCISION/WOUND CARE W/ASSISTANCE Description STG Able To Perform Incision/Wound Care With Mod I Assistance.  Outcome: Progressing   Problem: RH SAFETY Goal: RH STG ADHERE TO SAFETY PRECAUTIONS W/ASSISTANCE/DEVICE Description STG Adhere to Safety Precautions With Supervision Assistance/Device.  Outcome: Progressing   Problem: RH COGNITION-NURSING Goal: RH STG USES MEMORY AIDS/STRATEGIES W/ASSIST TO PROBLEM SOLVE Description STG Uses Memory Aids/Strategies With Min Assistance to Problem Solve.  Outcome: Progressing

## 2018-03-28 NOTE — Progress Notes (Signed)
Physical Therapy Session Note  Patient Details  Name: Ryan Savage MRN: [id number] Date of Birth: Mar 13, 1929  Today's Date: 03/28/2018 PT Individual Time: 1405-1440 PT Individual Time Calculation (min): 35 min   Short Term Goals: Week 1:  PT Short Term Goal 1 (Week 1): Pt will negotiate 3 steps + 3 steps with 1 rail & mod assist for home access. PT Short Term Goal 2 (Week 1): Pt will ambulate 75 ft with LRAD & min assist.  Skilled Therapeutic Interventions/Progress Updates: Pt received seated in recliner with wife present, denies pain and agreeable to treatment. Gait to gym with RW and min guard/minA; cues for R step length and foot clearance with noted mild R lateral lean and insufficient L weight shift during L stance resulting in decreased R foot progression. Standing balance with alternating LE taps to 6" step with BUE on RW progressed to LUE only on RW for focus on L weight shift, RLE coordination and stance control via forced use. Standing balance progression in parallel bars: normal BOS, staggered stance (R/L), normal BOS on foam, staggered stance on foam (R/L) with min guard/minA, LOB in 10-15 sec with more challenging conditions (staggered stance, foam); performed for focus on ankle/hip strategy, hip abduction strengthening, sensory reorganization. Handoff to OT at end of session.       Therapy Documentation Precautions:  Precautions Precautions: Fall Restrictions Weight Bearing Restrictions: No   See Function Navigator for Current Functional Status.   Therapy/Group: Individual Therapy  Luberta Mutter 03/28/2018, 2:40 PM

## 2018-03-28 NOTE — Progress Notes (Signed)
Physical Therapy Session Note  Patient Details  Name: Ryan Savage MRN: [id number] Date of Birth: Oct 10, 1929  Today's Date: 03/28/2018 PT Individual Time: 5397-6734 PT Individual Time Calculation (min): 45 min   Short Term Goals: Week 1:  PT Short Term Goal 1 (Week 1): Pt will negotiate 3 steps + 3 steps with 1 rail & mod assist for home access. PT Short Term Goal 2 (Week 1): Pt will ambulate 75 ft with LRAD & min assist.  Skilled Therapeutic Interventions/Progress Updates:  Tx focused on functional mobility training and NMR via forced use, manual facilitation, and multi-modal cues. Pt up in bed, ready to go.  Pt performed reciprocal; scooting with S to R/L with decreased hip clearance EOB dressing with min A overall for time management.  Cognitive remediation for safety awareness, word finding, and alternating attention  NMR tasks for balance training and RLE/UE NMR Seated marching bil x10 Standing mini-squats x10 Standing lateral weight shifts, static balance with even weight and RLE NMR   Gait in controlled setting 2x150' with RW Min  A for facilitation of posture and weight shifting and cues for RLE placement, upward gaze, posture, and RW proximity. Pt needed up to Mod A in busy environments and tight spaces    Pt performed 5xSTS 53sec from standard chair in dayroom with UE support.  Pt left up in recliner with chair alarm and all needs in reach.        Therapy Documentation Precautions:  Precautions Precautions: Fall Restrictions Weight Bearing Restrictions: No Pain: none     See Function Navigator for Current Functional Status.   Therapy/Group: Individual Therapy  Nayeli Calvert, Corinna Lines, PT, DPT  03/28/2018, 8:07 AM

## 2018-03-28 NOTE — Progress Notes (Signed)
Occupational Therapy Session Note  Patient Details  Name: Ryan Savage MRN: [id number] Date of Birth: 1929-05-25  Today's Date: 03/28/2018 OT Individual Time: 1130-1200 OT Individual Time Calculation (min): 30 min    Short Term Goals: Week 1:  OT Short Term Goal 1 (Week 1): Pt will complete 3/3 toileting tasks with guarding assist OT Short Term Goal 2 (Week 1): Pt will complete two grooming tasks in standing position with CGA and no seated rest break in order to increase functional activity tolerance OT Short Term Goal 3 (Week 1): Pt will ambulate into bathroom to complete ADLs with CGA OT Short Term Goal 4 (Week 1): Pt will recall proper hand placement on RW during functional transfers consistently at mod I level  Skilled Therapeutic Interventions/Progress Updates:    1:1. OT brought AE for use to attempt AE training with sock aide. OT demonstrated and pt return demonstrated with total cueing. After threading sock onto sock aide, pt puts sock aide to the side and attempts to don other sock onto foot. When asked how he felt it went, pt said it went well, but he would like to try it again. Pt unable to state what piece of clothing was used on the sock aide, but when given a field of 2 choices pt able to say sock. Exited session with pt seated in reclienr and wife present in room   Therapy Documentation Precautions:  Precautions Precautions: Fall Restrictions Weight Bearing Restrictions: No General:   Vital Signs:   Pain: Pain Assessment Pain Scale: 0-10 Pain Score: 0-No pain ADL:   Vision   Perception    Praxis   Exercises:   Other Treatments:    See Function Navigator for Current Functional Status.   Therapy/Group: Individual Therapy  Tonny Branch 03/28/2018, 12:06 PM

## 2018-03-28 NOTE — Progress Notes (Signed)
  Subjective/Complaints:  Patient denies complaints.  No headache, chest pain or shortness of breath.  Objective: Vital Signs: Blood pressure 120/75, pulse 86, temperature 98 F (36.7 C), temperature source Oral, resp. rate 18, height 5\' 10"  (1.778 m), weight 163 lb 12.8 oz (74.3 kg), SpO2 95 %. Elderly male in no acute distress.  HEENT exam surgical wounds of the scalp without erythema or drainage.  C Neck is supple Chest is clear to auscultation Cardiac exam S1 and S2 Abdominal exam active bowel sounds, soft Patient is alert.  Assessment/Plan: 1. Functional deficits secondary to recurrent Left SDH with Right hemiparesis   Medical Problem List and Plan: 1.  Deficits with mobility, self-care, and endurance secondary to recurrent left SDH. CIR PT, OT, SLP evals As d/w pt may need WC level goals to avoid falls at home 2.  DVT Prophylaxis/Anticoagulation: Mechanical: Sequential compression devices, below knee Bilateral lower extremities 3. Pain Management: On Neurontin for neuropathic symptoms.  4. Mood: Team to provide ego support. LCSW to follow for evaluation and support.  5. Neuropsych: This patient is not fully  capable of making decisions on his  own behalf. 6. Skin/Wound Care: Monitor wound for healing. Routine pressure relief measures.  7. Fluids/Electrolytes/Nutrition: Strict I/O. Maintain adequate nutritional support.  Basic Metabolic Panel:    Component Value Date/Time   NA 129 (L) 03/26/2018 0459   K 3.9 03/26/2018 0459   CL 93 (L) 03/26/2018 0459   CO2 27 03/26/2018 0459   BUN 10 03/26/2018 0459   CREATININE 0.66 03/26/2018 0459   GLUCOSE 109 (H) 03/26/2018 0459   CALCIUM 8.5 (L) 03/26/2018 0459  Will need to follow sodium level.  8. PAF: Monitor HR bid. Off anticoagulation due to recurrent bleeds. On metoprolol and lanoxin.  9. [name]/Chronic systolic CHF: Strict I/O. Monitor weights daily. On metoprolol  CXR similar to prior film ~1 wk ago 10. HTN: Monitor BP bid.  Continue metoprolol bid.  Vitals:   03/28/18 0309 03/28/18 0419  BP: 114/76 120/75  Pulse: 86 86  Resp:  18  Temp:  98 F (36.7 C)  SpO2: 92% 95%  reasonable control 5/17 11. Seizure prophylaxis: NS recommended  Keppra bid for 2-3 months , monitor for sedation 12. Insomnia: Will schedule trazodone early evening to help with sleep hygiene.  13. OSA: CPAP not effective and was schedule to have sleep study. Will order oxygen for use at night prn.  14.  Hypoalb- prostat   LOS (Days) 3 A FACE TO FACE EVALUATION WAS PERFORMED  Ryan Savage 03/28/2018, 6:55 AM

## 2018-03-29 ENCOUNTER — Inpatient Hospital Stay (HOSPITAL_COMMUNITY): Payer: Medicare Other | Admitting: Occupational Therapy

## 2018-03-29 NOTE — Progress Notes (Signed)
  Subjective/Complaints:  Patient feels well.  He has no complaints other than surgical wound on scalp is now quite pruritic.  Objective: Vital Signs: Blood pressure (!) 106/58, pulse 63, temperature 98.2 F (36.8 C), temperature source Oral, resp. rate 20, height 5\' 10"  (1.778 m), weight 162 lb 4.1 oz (73.6 kg), SpO2 96 %.  Elderly male in no acute distress HEENT exam: Left-sided scalp wound surgical site well-healed with multiple staples. Neck is supple Chest clear to auscultation Cardiac exam S1-S2 regular Abdominal exam active bowel sounds, soft Extremities without edema.  Assessment/Plan: 1. Functional deficits secondary to recurrent Left SDH with Right hemiparesis   Medical Problem List and Plan: 1.  Deficits with mobility, self-care, and endurance secondary to recurrent left SDH. CIR PT, OT, SLP evals He is encouraged with results at therapy but is concerned about going home. 2.  DVT Prophylaxis/Anticoagulation: Mechanical: Sequential compression devices, below knee Bilateral lower extremities 3. Pain Management: On Neurontin for neuropathic symptoms.  4. Mood: Team to provide ego support. LCSW to follow for evaluation and support.  5. Neuropsych: This patient is not fully  capable of making decisions on his  own behalf. 6. Skin/Wound Care: Monitor wound for healing. Routine pressure relief measures.  7. Fluids/Electrolytes/Nutrition: Strict I/O. Maintain adequate nutritional support.  Basic Metabolic Panel:    Component Value Date/Time   NA 129 (L) 03/26/2018 0459   K 3.9 03/26/2018 0459   CL 93 (L) 03/26/2018 0459   CO2 27 03/26/2018 0459   BUN 10 03/26/2018 0459   CREATININE 0.66 03/26/2018 0459   GLUCOSE 109 (Ryan) 03/26/2018 0459   CALCIUM 8.5 (L) 03/26/2018 0459  Will need to follow sodium level.  8. PAF: Currently in sinus rhythm on exam. 9. [name]/Chronic systolic CHF: Strict I/O. Monitor weights daily. On metoprolol  CXR similar to prior film ~1 wk ago 10. HTN:  Monitor BP bid. Continue metoprolol bid.  Vitals:   03/28/18 2104 03/29/18 0501  BP: 129/82 (!) 106/58  Pulse: 73 63  Resp: 18 20  Temp: 98 F (36.7 C) 98.2 F (36.8 C)  SpO2: 98% 96%  He has no signs of hypotension. 11. Seizure prophylaxis: NS recommended  Keppra bid for 2-3 months , monitor for sedation 12. Insomnia: Will schedule trazodone early evening to help with sleep hygiene.  13. OSA: CPAP not effective and was schedule to have sleep study. Will order oxygen for use at night prn.  14.  Hypoalb- prostat   LOS (Days) 4 A FACE TO FACE EVALUATION WAS PERFORMED  Ryan Savage Ryan Savage 03/29/2018, 9:16 AM

## 2018-03-29 NOTE — Progress Notes (Signed)
Occupational Therapy Session Note  Patient Details  Name: Euclide Granito MRN: [id number] Date of Birth: [birth year]  Today's Date: 03/29/2018 OT Individual Time: 1191-4782 OT Individual Time Calculation (min): 57 min   Skilled Therapeutic Interventions/Progress Updates:   Pt greeted in recliner with spouse Enid Derry present. No c/o pain. Requesting to shower. Tx focus on functional transfers, balance, safety awareness, and UE coordination during bathing and dressing tasks. All functional transfers completed using RW at ambulatory level with steady assist. He required cues for backing up to TTB and keeping walker close to body. Pt able to utilize figure 4 to wash both feet with extra time. Increased cognitive challenge by instructing him how to use temperature controls and manage hand held shower hose. OT assisted with HH shower while he stood to complete perihygiene. Afterwards pt dressed sit<stand with RW from recliner. He required increased time for meeting FM demands while buttoning, threading belt, and fastening pants. He was very proud he was able to do these tasks himself! Pt required steady assist overall and cues to complete FM tasks in seated vs. standing positions. At end of session pt was left with spouse and chair alarm set.   Therapy Documentation Precautions:  Precautions Precautions: Fall Restrictions Weight Bearing Restrictions: No ADL:    See Function Navigator for Current Functional Status.   Therapy/Group: Individual Therapy  Taelyr Jantz A Jalie Eiland 03/29/2018, 12:54 PM

## 2018-03-29 NOTE — Progress Notes (Signed)
Patient sleep most of the night only awakening to request bathroom assistance.

## 2018-03-29 NOTE — Plan of Care (Signed)
  Problem: Consults Goal: RH BRAIN INJURY PATIENT EDUCATION Description Description: See Patient Education module for eduction specifics Outcome: Progressing   Problem: RH BLADDER ELIMINATION Goal: RH STG MANAGE BLADDER WITH ASSISTANCE Description STG Manage Bladder With Mod I Assistance  Outcome: Progressing   Problem: RH SKIN INTEGRITY Goal: RH STG SKIN FREE OF INFECTION/BREAKDOWN Description No new breakdown with supervision assist   Outcome: Progressing Goal: RH STG ABLE TO PERFORM INCISION/WOUND CARE W/ASSISTANCE Description STG Able To Perform Incision/Wound Care With Mod I Assistance.  Outcome: Progressing   Problem: RH SAFETY Goal: RH STG ADHERE TO SAFETY PRECAUTIONS W/ASSISTANCE/DEVICE Description STG Adhere to Safety Precautions With Supervision Assistance/Device.  Outcome: Progressing   Problem: RH COGNITION-NURSING Goal: RH STG USES MEMORY AIDS/STRATEGIES W/ASSIST TO PROBLEM SOLVE Description STG Uses Memory Aids/Strategies With Min Assistance to Problem Solve.  Outcome: Progressing

## 2018-03-30 ENCOUNTER — Inpatient Hospital Stay (HOSPITAL_COMMUNITY): Payer: Medicare Other

## 2018-03-30 ENCOUNTER — Inpatient Hospital Stay (HOSPITAL_COMMUNITY): Payer: Medicare Other | Admitting: Physical Therapy

## 2018-03-30 DIAGNOSIS — S069X3S Unspecified intracranial injury with loss of consciousness of 1 hour to 5 hours 59 minutes, sequela: Secondary | ICD-10-CM

## 2018-03-30 DIAGNOSIS — G4701 Insomnia due to medical condition: Secondary | ICD-10-CM

## 2018-03-30 NOTE — Discharge Summary (Signed)
Physician Discharge Summary  Patient ID: Ryan Savage MRN: [id number] DOB/AGE: 1928-12-11 84 y.o.  Admit date: 03/19/2018 Discharge date: 03/25/2018  Admission Diagnoses: Acute and chronic subdural hematoma on the left. Discharge Diagnoses: Acute and chronic subdural hematoma on the left.  Functional strength deficit on the right.  Hyponatremia.  Thrombocytopenia.  Paroxysmal atrial fibrillation. Active Problems:   Subdural hematoma (HCC)   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 (HCC)   Protein-calorie malnutrition, severe   Thrombocytopenia (HCC)   History of subdural hematoma   Tachypnea   Subdural hematoma without coma (HCC)   Chronic systolic CHF (congestive heart failure) (HCC)   Seizure prophylaxis   Insomnia   PAF (paroxysmal atrial fibrillation) ([place])   Discharged Condition: fair  Hospital Course: Patient was admitted to undergo surgical decompression of her recurring acute on chronic subdural hematoma.  He had 2 previous admissions in the last 2 months one in March and one in April each for bur hole drainage initially of bilateral subdural hematomas and then of a recurrence on the left.  He reaccumulated a significant collection on the left side and was felt that a craniotomy would be required in order to provide an adequate decompression.  Postoperatively he had significant pneumocephalus and he required substantial rehabilitation.  He is being transferred to an inpatient rehabilitation unit at [facility].  Consults: rehabilitation medicine  Significant Diagnostic Studies: CT scan  Treatments: surgery: Left frontotemporoparietal craniotomy for evacuation of subacute and chronic subdural hematoma  Discharge Exam: Blood pressure 132/78, pulse 72, temperature 97.6 F (36.4 C), resp. rate 17, height 5\' 10"  (1.778 m), weight 79.4 kg (175 lb 0.7 oz), SpO2 100 %. Incision is clean and dry.  Staples have been removed.  Patient is ambulatory with assistance.   He has some modest weakness in his right upper and right lower extremity.  He is confused easily.  Disposition:   Discharge Instructions    Diet - low sodium heart healthy   Complete by:  As directed    Increase activity slowly   Complete by:  As directed      Allergies as of 03/25/2018      Reactions   Novocain [procaine] Rash      Medication List    TAKE these medications   atorvastatin 20 MG tablet Commonly known as:  LIPITOR Take 1 tablet (20 mg total) by mouth every evening.   digoxin 0.125 MG tablet Commonly known as:  LANOXIN Take 1 tablet (0.125 mg total) by mouth daily.   docusate sodium 100 MG capsule Commonly known as:  COLACE Take 1 capsule (100 mg total) by mouth daily as needed for mild constipation.   finasteride 5 MG tablet Commonly known as:  PROSCAR Take 1 tablet (5 mg total) by mouth daily.   fluticasone 50 MCG/ACT nasal spray Commonly known as:  FLONASE Place 1 spray into both nostrils daily.   gabapentin 300 MG capsule Commonly known as:  NEURONTIN Take 1 capsule (300 mg total) by mouth at bedtime.   levETIRAcetam 500 MG tablet Commonly known as:  KEPPRA Take 1 tablet (500 mg total) by mouth 2 (two) times daily.   metoprolol succinate 25 MG 24 hr tablet Commonly known as:  TOPROL-XL Take 1 tablet (25 mg total) by mouth daily.   pantoprazole 40 MG tablet Commonly known as:  PROTONIX Take 1 tablet (40 mg total) by mouth every evening.   ranitidine 150 MG tablet Commonly known as:  ZANTAC Take 150 mg by mouth  2 (two) times daily.   traZODone 50 MG tablet Commonly known as:  DESYREL Take 0.5-1 tablets (25-50 mg total) by mouth at bedtime as needed for sleep.        SignedEarleen Newport 03/30/2018, 2:56 PM

## 2018-03-30 NOTE — Progress Notes (Signed)
Physical Therapy Session Note  Patient Details  Name: Ryan Savage MRN: [id number] Date of Birth: [birth year]  Today's Date: 03/30/2018 PT Individual Time: 1355-1449 PT Individual Time Calculation (min): 54 min   Short Term Goals: Week 1:  PT Short Term Goal 1 (Week 1): Pt will negotiate 3 steps + 3 steps with 1 rail & mod assist for home access. PT Short Term Goal 2 (Week 1): Pt will ambulate 75 ft with LRAD & min assist.  Skilled Therapeutic Interventions/Progress Updates:  Pt received in w/c shaving with electric razor & completed task. Pt denied c/o pain. Pt's wife Ryan Savage) present for session. Pt requires cuing for hand placement for sit<>stand transfers and ambulates room<>gym with RW & steady assist with frequent cuing for forward gaze & increased step length BLE with fair return demo by pt. Pt easily distracted in hallway and requires max cuing to attend to task as well as education & max cuing to ambulate within base of RW especially when making turns as pt frequently stepping on outside of RW causing small losses of balance. Thoroughly educated pt & wife on pt's decreased awareness (I.e. Emergent & anticipatory awareness), as well as decreased ability to divide attention (talking while walking) and need to focus on one task at a time. Pt continues to demonstrate impaired ability to focus on education and reduced safety awareness. Reviewed stair negotiation with pt & wife, and offered to provide written instructions but pt's wife declines these, reporting she can remember. Educated pt & wife on need to have RW awaiting him at top/bottom of steps and then wife assist pt up/down stairs. Also educated pt on need to have wife physically beside him providing supervision if standing or ambulating. Pt negotiated 12 steps laterally with 1 rail at a time (practiced using L & R) to simulate home environment. Pt requires cuing to allow space on steps for both feet but does not demonstrate any buckling  when negotiating stairs. When ambulating back to room pt reported need to have BM & was assisted to toilet in room where pt was observed to have incontinent BM. Pt left on toilet in care of NT.  Therapy Documentation Precautions:  Precautions Precautions: Fall Restrictions Weight Bearing Restrictions: No    See Function Navigator for Current Functional Status.   Therapy/Group: Individual Therapy  Ryan Savage 03/30/2018, 5:53 PM

## 2018-03-30 NOTE — Progress Notes (Signed)
Occupational Therapy Session Note  Patient Details  Name: Ryan Savage MRN: [id number] Date of Birth: Mar 12, 1929  Today's Date: 03/30/2018 OT Individual Time: 0945-1100 OT Individual Time Calculation (min): 75 min    Short Term Goals: Week 1:  OT Short Term Goal 1 (Week 1): Pt will complete 3/3 toileting tasks with guarding assist OT Short Term Goal 2 (Week 1): Pt will complete two grooming tasks in standing position with CGA and no seated rest break in order to increase functional activity tolerance OT Short Term Goal 3 (Week 1): Pt will ambulate into bathroom to complete ADLs with CGA OT Short Term Goal 4 (Week 1): Pt will recall proper hand placement on RW during functional transfers consistently at mod I level  Skilled Therapeutic Interventions/Progress Updates:    Pt received in recliner agreeable to therapy with no c/o pain. Session focused on b/d tasks and safety awareness during transitional movement/LB dressing. Pt set up to complete UB/LB bathing. Pt had difficulty problem solving through threading L UE through button up shirt, requiring min A to locate sleeve posteriorly. Pt required increased time for [facility] tasks, like buttoning his shirt and pants, but did so without cues. Pt was allowed extra time to problem solve through LB dressing with vc provided for increased safety awareness and completing LB threading tasks from a seated position rather than standing to reduce fall risk. Pt required vc for use of figure 4 technique to don socks, recalling a previous therapist instructing him in a similar manner.  Pt then attempted to complete shoe fastening with increased difficulty and time spent in full forward flexion. OT obtained 2 shoe buttons and instructed pt in use. Pt very excited to use shoe buttons and able to return demo with min A. Pt completed 5 ft of functional mobility with RW to the sink with (S) and completed oral care in standing. Pt left sitting up in w/c with QRB donned,  chair alarm set, and all needs met.   Therapy Documentation Precautions:  Precautions Precautions: Fall Restrictions Weight Bearing Restrictions: No Pain: Pain Assessment Pain Scale: 0-10 Pain Score: 0-No pain  See Function Navigator for Current Functional Status.   Therapy/Group: Individual Therapy  Curtis Sites 03/30/2018, 12:14 PM

## 2018-03-30 NOTE — Progress Notes (Signed)
Speech Language Pathology Daily Session Note  Patient Details  Name: Ryan Savage MRN: [id number] Date of Birth: 1929-06-18  Today's Date: 03/30/2018 SLP Individual Time: 0800-0900 SLP Individual Time Calculation (min): 60 min  Short Term Goals: Week 1: SLP Short Term Goal 1 (Week 1): Patient will demonstrate functional problem solving for functional and familiar tasks with Min A verbal cues.  SLP Short Term Goal 2 (Week 1): Patient will utilize external aids to recall new, daily information with Min A verbal cues.  SLP Short Term Goal 3 (Week 1): Patient will self-monitor and correct errors during functional tasks with Min A verbal cues.   Skilled Therapeutic Interventions:Skilled ST services focused on cogntive skills. SLP facilitated functional problem solving skills with novel card game, Blink, pt required min A fading to supervision A verbal cues at basic level and mod A verbal cues at most complex level. Pt required mod-min A verbal cues to utilize visual aid to recall rules and min A verbal cures to recognize and correct errors during funtional tasks. Pt was left in room with call bell within reach. Recommend to continue skilled ST services.       Function:  Eating Eating   Modified Consistency Diet: No Eating Assist Level: No help, No cues           Cognition Comprehension Comprehension assist level: Follows basic conversation/direction with extra time/assistive device  Expression   Expression assist level: Expresses basic needs/ideas: With extra time/assistive device  Social Interaction Social Interaction assist level: Interacts appropriately with others with medication or extra time (anti-anxiety, antidepressant).  Problem Solving Problem solving assist level: Solves basic 75 - 89% of the time/requires cueing 10 - 24% of the time  Memory Memory assist level: Recognizes or recalls 50 - 74% of the time/requires cueing 25 - 49% of the time;Recognizes or recalls 75 - 89% of  the time/requires cueing 10 - 24% of the time    Pain Pain Assessment Pain Scale: 0-10 Pain Score: 0-No pain  Therapy/Group: Individual Therapy  Lota Leamer  [facility] 03/30/2018, 3:12 PM

## 2018-03-30 NOTE — Plan of Care (Signed)
  Problem: Consults Goal: RH BRAIN INJURY PATIENT EDUCATION Description Description: See Patient Education module for eduction specifics Outcome: Progressing   Problem: RH BLADDER ELIMINATION Goal: RH STG MANAGE BLADDER WITH ASSISTANCE Description STG Manage Bladder With Mod I Assistance  Outcome: Progressing   Problem: RH SKIN INTEGRITY Goal: RH STG SKIN FREE OF INFECTION/BREAKDOWN Description No new breakdown with supervision assist   Outcome: Progressing Goal: RH STG ABLE TO PERFORM INCISION/WOUND CARE W/ASSISTANCE Description STG Able To Perform Incision/Wound Care With Mod I Assistance.  Outcome: Progressing   Problem: RH SAFETY Goal: RH STG ADHERE TO SAFETY PRECAUTIONS W/ASSISTANCE/DEVICE Description STG Adhere to Safety Precautions With Supervision Assistance/Device.  Outcome: Progressing   Problem: RH COGNITION-NURSING Goal: RH STG USES MEMORY AIDS/STRATEGIES W/ASSIST TO PROBLEM SOLVE Description STG Uses Memory Aids/Strategies With Min Assistance to Problem Solve.  Outcome: Progressing

## 2018-03-30 NOTE — Progress Notes (Signed)
Subjective/Complaints:  Pt working with SLP. No new problems over weekend. Denies pain.   ROS: Patient denies fever, rash, sore throat, blurred vision, nausea, vomiting, diarrhea, cough, shortness of breath or chest pain, joint or back pain, headache, or mood change.    Objective: Vital Signs: Blood pressure 121/78, pulse 76, temperature 98 F (36.7 C), temperature source Oral, resp. rate 18, height 5\' 10"  (1.778 m), weight 73.6 kg (162 lb 4.1 oz), SpO2 96 %. No results found. No results found for this or any previous visit (from the past 72 hour(s)).   Constitutional: No distress . Vital signs reviewed. HEENT: EOMI, oral membranes moist Neck: supple Cardiovascular: RRR without murmur. No JVD    Respiratory: CTA Bilaterally without wheezes or rales. Normal effort    GI: BS +, non-tender, non-distended  Extremity:  Pulses positive and No Edema Skin:   Intact Neuro: Alert/Oriented to self and place.  and Abnormal Motor 4/5 RIght delt, Bi, Tri, Grip , HF, KE ADF Musc/Skel:  Other no pain with UE or LE ROM     Assessment/Plan: 1. Functional deficits secondary to recurrent Left SDH with Right hemiparesis which require 3+ hours per day of interdisciplinary therapy in a comprehensive inpatient rehab setting. Physiatrist is providing close team supervision and 24 hour management of active medical problems listed below. Physiatrist and rehab team continue to assess barriers to discharge/monitor patient progress toward functional and medical goals. FIM: Function - Bathing Position: Shower Body parts bathed by patient: Right arm, Left arm, Abdomen, Front perineal area, Right upper leg, Left upper leg, Buttocks, Chest, Right lower leg, Left lower leg Body parts bathed by helper: Back Assist Level: Touching or steadying assistance(Pt > 75%)  Function- Upper Body Dressing/Undressing What is the patient wearing?: Button up shirt, Pull over shirt/dress Pull over shirt/dress - Perfomed by  patient: Thread/unthread right sleeve, Thread/unthread left sleeve, Pull shirt over trunk, Put head through opening Pull over shirt/dress - Perfomed by helper: Put head through opening Button up shirt - Perfomed by patient: Thread/unthread right sleeve, Thread/unthread left sleeve, Pull shirt around back, Button/unbutton shirt Button up shirt - Perfomed by helper: Pull shirt around back, Button/unbutton shirt Assist Level: Supervision or verbal cues Function - Lower Body Dressing/Undressing What is the patient wearing?: Non-skid slipper socks, Pants, Underwear Position: Sitting EOB Underwear - Performed by patient: Thread/unthread left underwear leg, Pull underwear up/down, Thread/unthread right underwear leg Underwear - Performed by helper: Thread/unthread right underwear leg Pants- Performed by patient: Thread/unthread left pants leg, Pull pants up/down, Fasten/unfasten pants, Thread/unthread right pants leg Pants- Performed by helper: Thread/unthread right pants leg Non-skid slipper socks- Performed by patient: Don/doff right sock, Don/doff left sock Socks - Performed by patient: Don/doff right sock, Don/doff left sock Socks - Performed by helper: Don/doff left sock(A with using sock aid ) Shoes - Performed by patient: Fasten left Shoes - Performed by helper: Don/doff right shoe, Don/doff left shoe, Fasten left Assist for footwear: Partial/moderate assist Assist for lower body dressing: Touching or steadying assistance (Pt > 75%)  Function - Toileting Toileting steps completed by patient: Adjust clothing prior to toileting, Performs perineal hygiene, Adjust clothing after toileting Toileting steps completed by helper: Performs perineal hygiene, Adjust clothing after toileting Toileting Assistive Devices: Grab bar or rail Assist level: Touching or steadying assistance (Pt.75%)  Function - Air cabin crew transfer assistive device: Grab bar Assist level to toilet: Moderate assist  (Pt 50 - 74%/lift or lower) Assist level from toilet: Moderate assist (Pt 50 - 74%/lift  or lower)  Function - Chair/bed transfer Chair/bed transfer method: Ambulatory Chair/bed transfer assist level: Touching or steadying assistance (Pt > 75%) Chair/bed transfer assistive device: Walker Chair/bed transfer details: Tactile cues for sequencing, Tactile cues for weight shifting, Tactile cues for placement, Verbal cues for sequencing, Verbal cues for technique  Function - Locomotion: Wheelchair Will patient use wheelchair at discharge?: No Wheelchair activity did not occur: Safety/medical concerns Max wheelchair distance: 175 ft Assist Level: Supervision or verbal cues Wheel 50 feet with 2 turns activity did not occur: Safety/medical concerns Assist Level: Supervision or verbal cues Wheel 150 feet activity did not occur: Safety/medical concerns Assist Level: Supervision or verbal cues Function - Locomotion: Ambulation Assistive device: Walker-rolling Max distance: 150 ft Assist level: Touching or steadying assistance (Pt > 75%) Assist level: Touching or steadying assistance (Pt > 75%) Walk 50 feet with 2 turns activity did not occur: Safety/medical concerns Assist level: Touching or steadying assistance (Pt > 75%) Walk 150 feet activity did not occur: Safety/medical concerns Assist level: Touching or steadying assistance (Pt > 75%) Walk 10 feet on uneven surfaces activity did not occur: Safety/medical concerns  Function - Comprehension Comprehension: Auditory Comprehension assist level: Follows basic conversation/direction with extra time/assistive device  Function - Expression Expression: Verbal Expression assist level: Expresses basic needs/ideas: With extra time/assistive device  Function - Social Interaction Social Interaction assist level: Interacts appropriately with others with medication or extra time (anti-anxiety, antidepressant).  Function - Problem Solving Problem  solving assist level: Solves basic 75 - 89% of the time/requires cueing 10 - 24% of the time  Function - Memory Memory assist level: Recognizes or recalls 50 - 74% of the time/requires cueing 25 - 49% of the time Patient normally able to recall (first 3 days only): Current season, Location of own room, Staff names and faces, That he or she is in a hospital   Medical Problem List and Plan: 1.  Deficits with mobility, self-care, and endurance secondary to recurrent left SDH.   -continue CIR PT, OT, SLP   2.  DVT Prophylaxis/Anticoagulation: Mechanical: Sequential compression devices, below knee Bilateral lower extremities 3. Pain Management: On Neurontin for neuropathic symptoms.  4. Mood: Team to provide ego support. LCSW to follow for evaluation and support.  5. Neuropsych: This patient is not fully  capable of making decisions on his  own behalf. 6. Skin/Wound Care: Monitor wound for healing. Routine pressure relief measures.  7. Fluids/Electrolytes/Nutrition: Strict I/O. Maintain adequate nutritional support.  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 PAF: Monitor HR bid. Off anticoagulation due to recurrent bleeds. On metoprolol and lanoxin.  9. [name]/Chronic systolic CHF: Strict I/O. Monitor weights daily. On metoprolol  CXR similar to prior film ~1 wk ago  -stable Filed Weights   03/27/18 0700 03/28/18 0500 03/29/18 0501  Weight: 74.4 kg (164 lb 0.4 oz) 74.3 kg (163 lb 12.8 oz) 73.6 kg (162 lb 4.1 oz)    10. HTN: Monitor BP bid. Continue metoprolol bid.  Vitals:   03/30/18 0522 03/30/18 0808  BP: 121/78   Pulse: 83 76  Resp: 18   Temp: 98 F (36.7 C)   SpO2: 96%   reasonable control 5/20 11. Seizure prophylaxis: NS recommended  Keppra bid for 2-3 months. Does not appear overly sedated  12. Insomnia: scheduled trazodone early evening has helped sleep. Slept most of the night last evening.  13. OSA: CPAP not effective and was schedule to have sleep study.  oxygen for use at night prn.  14.  Hypoalb- prostat   LOS (Days) 5 A FACE TO FACE EVALUATION WAS PERFORMED  Meredith Staggers 03/30/2018, 9:25 AM

## 2018-03-31 ENCOUNTER — Encounter (HOSPITAL_BASED_OUTPATIENT_CLINIC_OR_DEPARTMENT_OTHER): Payer: Self-pay

## 2018-03-31 ENCOUNTER — Inpatient Hospital Stay (HOSPITAL_COMMUNITY): Payer: Medicare Other

## 2018-03-31 ENCOUNTER — Inpatient Hospital Stay (HOSPITAL_COMMUNITY): Payer: Medicare Other | Admitting: Occupational Therapy

## 2018-03-31 ENCOUNTER — Inpatient Hospital Stay (HOSPITAL_COMMUNITY): Payer: Medicare Other | Admitting: Physical Therapy

## 2018-03-31 DIAGNOSIS — G4710 Hypersomnia, unspecified: Secondary | ICD-10-CM

## 2018-03-31 DIAGNOSIS — R0683 Snoring: Secondary | ICD-10-CM

## 2018-03-31 DIAGNOSIS — G4733 Obstructive sleep apnea (adult) (pediatric): Secondary | ICD-10-CM

## 2018-03-31 MED ORDER — DOCUSATE SODIUM 100 MG PO CAPS: 100.0000 mg | ORAL_CAPSULE | Freq: Two times a day (BID) | ORAL | Status: DC | PRN

## 2018-03-31 MED ORDER — SENNA 8.6 MG PO TABS
1.0000 | ORAL_TABLET | Freq: Every day | ORAL | Status: DC | PRN
  Administered 2018-04-01: 8.6 mg via ORAL
  Filled 2018-03-31: qty 1

## 2018-03-31 NOTE — Plan of Care (Signed)
  Problem: Consults Goal: RH BRAIN INJURY PATIENT EDUCATION Description Description: See Patient Education module for eduction specifics Outcome: Progressing   Problem: RH BLADDER ELIMINATION Goal: RH STG MANAGE BLADDER WITH ASSISTANCE Description STG Manage Bladder With Mod I Assistance  Outcome: Progressing   Problem: RH SKIN INTEGRITY Goal: RH STG SKIN FREE OF INFECTION/BREAKDOWN Description No new breakdown with supervision assist   Outcome: Progressing Goal: RH STG ABLE TO PERFORM INCISION/WOUND CARE W/ASSISTANCE Description STG Able To Perform Incision/Wound Care With Mod I Assistance.  Outcome: Progressing   Problem: RH SAFETY Goal: RH STG ADHERE TO SAFETY PRECAUTIONS W/ASSISTANCE/DEVICE Description STG Adhere to Safety Precautions With Supervision Assistance/Device.  Outcome: Progressing   Problem: RH COGNITION-NURSING Goal: RH STG USES MEMORY AIDS/STRATEGIES W/ASSIST TO PROBLEM SOLVE Description STG Uses Memory Aids/Strategies With Min Assistance to Problem Solve.  Outcome: Progressing

## 2018-03-31 NOTE — Progress Notes (Signed)
Speech Language Pathology Daily Session Note  Patient Details  Name: Ryan Savage MRN: [id number] Date of Birth: 09/24/1929  Today's Date: 03/31/2018 SLP Individual Time: 1000-1100 SLP Individual Time Calculation (min): 60 min  Short Term Goals: Week 1: SLP Short Term Goal 1 (Week 1): Patient will demonstrate functional problem solving for functional and familiar tasks with Min A verbal cues.  SLP Short Term Goal 2 (Week 1): Patient will utilize external aids to recall new, daily information with Min A verbal cues.  SLP Short Term Goal 3 (Week 1): Patient will self-monitor and correct errors during functional tasks with Min A verbal cues.   Skilled Therapeutic Interventions:Skilled ST services focused on cognitive skills. Pt's wife was present for treatment session. SLP facilitated transfer from [facility] to [place] to recliner chair upon pt request, Pt required Max A verbal cues to recall/utilize precautions ( brakes, and reach back to chair.) SLP facilitated recall recording today's activities in memory notebook, pt did not recall to use memory book yesterday per pt.SLP facilitated basic problem solving sequencing 3-4 step picture card, pt required max-mod A verbal cues for problem solving and error awareness due to impairments in though organization and sustained attention and max A verbal cues to correct max A cues. Pt required max A fading to Mod A for awareness of deficits and pt's wife stated the session was "enlighting" about pt's current condition, referring to impairment in awareness. Pt was left in room with call bell within reach. Reccomend to continue skilled ST services.      Function:  Eating Eating                 Cognition Comprehension Comprehension assist level: Follows basic conversation/direction with extra time/assistive device  Expression   Expression assist level: Expresses basic needs/ideas: With extra time/assistive device  Social Interaction Social Interaction assist  level: Interacts appropriately with others with medication or extra time (anti-anxiety, antidepressant).  Problem Solving Problem solving assist level: Solves basic 25 - 49% of the time - needs direction more than half the time to initiate, plan or complete simple activities  Memory Memory assist level: Recognizes or recalls 50 - 74% of the time/requires cueing 25 - 49% of the time    Pain Pain Assessment Pain Score: 0-No pain  Therapy/Group: Individual Therapy  Tydarius Yawn  [facility] 03/31/2018, 3:36 PM

## 2018-03-31 NOTE — Progress Notes (Signed)
Social Work Patient ID: Ryan Savage, male   DOB: 21-Nov-1928, 84 y.o.   MRN: [id number]  Clinical update sent for pt and will await response from [facility]. Pt doing well in his therapies and wife is here daily.

## 2018-03-31 NOTE — Progress Notes (Signed)
Physical Therapy Session Note  Patient Details  Name: Ryan Savage MRN: [id number] Date of Birth: 03-12-1929  Today's Date: 03/31/2018 PT Individual Time: 1305-1420 PT Individual Time Calculation (min): 75 min   Short Term Goals: Week 1:  PT Short Term Goal 1 (Week 1): Pt will negotiate 3 steps + 3 steps with 1 rail & mod assist for home access. PT Short Term Goal 2 (Week 1): Pt will ambulate 75 ft with LRAD & min assist.  Skilled Therapeutic Interventions/Progress Updates:  Pt received in room with wife Ryan Savage), daughter-in-law Ryan Savage), and son Ryan Savage) present for session. Therapist spent significant time at beginning of session and throughout session educating pt & family on pt's impaired memory, recall, safety awareness, and balance. Therapist educated pt & family on need for pt to use RW at all times, instead of rollator son has for pt to use when they go to the park (per son's report). Also educated them on need to make clear pathways throughout home and either remove or secure throw rugs to floor to prevent tripping hazards. This therapist educates them on need for caregiver to be on R side of pt at any point if he is standing or walking, and also educated them on guarding techniques in order to assist pt if he experiences any LOB. Pt with decreased ability to recall things from previous therapy sessions on this date. Therapist also reiterates importance of pt's safety with tasks (focus on safety with walking vs increasing walking distance) and that pt should not walk and talk at the same time as he is unable to alternate his attention. Pt & family voiced understanding of all education but Ryan Savage reports pt's wife Ryan Savage) is unable to provide optimal cognitive supervision and they are encouraging pt & wife to hire someone to assist them in the home. During session pt requires frequent cuing for hand placement to increase safety with sit<>stand transfers. Pt ambulates room>gym>dayroom>room with RW  & close supervision with absent heel strike & decreased step length BLE. Pt requires moderate cuing to ambulate within base of RW during linear paths & especially turns. Pt utilized cybex kinetron in sitting & standing (BUE support) with task focusing on weight shifting, BLE strengthening, & NMR. Pt practiced transferring in/out of chair without armrests multiple times as Ryan Savage reports he sits in kitchen chair without armrests often; pt with very poor memory and forgot which chair he was practicing with after ambulating around table. Therapist educated family on pt's deficits throughout session. At end of session pt left sitting in recliner with quick release belt & chair alarm donned, needs within reach, & family present to supervise.   Beth requesting pt ambulate in/out of bathroom with staff and be put on timed toileting schedule. Physical therapist in agreement with this and RN made aware.   Therapy Documentation Precautions:  Precautions Precautions: Fall Restrictions Weight Bearing Restrictions: No  Pain: No c/o pain reported.   See Function Navigator for Current Functional Status.   Therapy/Group: Abanda 03/31/2018, 4:02 PM

## 2018-03-31 NOTE — Progress Notes (Signed)
Occupational Therapy Session Note  Patient Details  Name: Ryan Savage MRN: [id number] Date of Birth: [birth year]  Today's Date: 03/31/2018 OT Individual Time: 3614-4315 OT Individual Time Calculation (min): 58 min    Short Term Goals: Week 1:  OT Short Term Goal 1 (Week 1): Pt will complete 3/3 toileting tasks with guarding assist OT Short Term Goal 2 (Week 1): Pt will complete two grooming tasks in standing position with CGA and no seated rest break in order to increase functional activity tolerance OT Short Term Goal 3 (Week 1): Pt will ambulate into bathroom to complete ADLs with CGA OT Short Term Goal 4 (Week 1): Pt will recall proper hand placement on RW during functional transfers consistently at mod I level  Skilled Therapeutic Interventions/Progress Updates:    Pt seen for OT ADL bathing/dressing session. Pt asleep in supine upon arrival, easily awoken and agreeable to tx session. He ambulated throughout session with close supervision- guarding assist. Although improving, pt cont to require VCs for RW management in functional context and cues to remain inside RW during ambulation specifically when turning.  He completed functioanl transfers throughout session with close supervision-min A. He bathed seated on tub transfer bench, standing with use of grab bars and completing pericare/ buttock hygiene. Min cuing for sequencing of task.  He returned to low couch to dress, able to bend down to thread B LEs into underwear/pants. With increased time, able to thread around button down shirt and button all buttons. Pt demonstrates some sequencing deficits during LB dressing, however, when given time able to problem solve and complete all tasks though requires increased energy and time to complete task 2/2 sequencing deficits.  Pt had been provided with shoe buttons in previous session, however, required cont education and demonstration for use.  Following seated rest break, he ambulated to sink  and completed grooming tasks from standing position, tolerating ~3 minutes in standing before requiring seated rest break. Pt returned to w/c at end of session, left seated set-up with breakfast and QRB donned, all needs in reach. Pt improving with functional mobility, overall close supervision assist, however, pt's poor safety awareness, awareness into deficits and poor RW management make him a big fall risk, esp at d/c. Pt will require close 24 hr supervision assist at d/c, unsure if pt's wife understands extent of pt's deficits.   Therapy Documentation Precautions:  Precautions Precautions: Fall Restrictions Weight Bearing Restrictions: No Pain:   No/denies pain  See Function Navigator for Current Functional Status.   Therapy/Group: Individual Therapy  Blayne Garlick L 03/31/2018, 6:55 AM

## 2018-03-31 NOTE — Progress Notes (Signed)
Subjective/Complaints: Discussed the patient's gait disorder as well as risk for additional falls.  I discussed this with OT as well.  He does not have a great insight into the cumulative nature of his head injuries.  We will need to reiterate this with the patient's wife.  I did indicate to the patient that he should not get up by himself at night under any circumstances.  He needs to wait until the morning when his wife can take him.  He can use a urinal at bedside to urinate    ROS: Patient denies fever, rash, sore throat, blurred vision, nausea, vomiting, diarrhea, cough, shortness of breath or chest pain, joint or back pain, headache, or mood change.    Objective: Vital Signs: Blood pressure (!) 90/50, pulse 63, temperature 98 F (36.7 C), resp. rate 18, height 5\' 10"  (1.778 m), weight 72.6 kg (160 lb 0.9 oz), SpO2 97 %. No results found. No results found for this or any previous visit (from the past 72 hour(s)).   Constitutional: No distress . Vital signs reviewed. HEENT: EOMI, oral membranes moist Neck: supple Cardiovascular: RRR without murmur. No JVD    Respiratory: CTA Bilaterally without wheezes or rales. Normal effort    GI: BS +, non-tender, non-distended  Extremity:  Pulses positive and No Edema Skin:   Intact Neuro: Alert/Oriented to self and place.  and Abnormal Motor 4/5 RIght delt, Bi, Tri, Grip , HF, KE ADF Musc/Skel:  Other no pain with UE or LE ROM     Assessment/Plan: 1. Functional deficits secondary to recurrent Left SDH with Right hemiparesis which require 3+ hours per day of interdisciplinary therapy in a comprehensive inpatient rehab setting. Physiatrist is providing close team supervision and 24 hour management of active medical problems listed below. Physiatrist and rehab team continue to assess barriers to discharge/monitor patient progress toward functional and medical goals. FIM: Function - Bathing Position: Wheelchair/chair at sink Body parts bathed  by patient: Right arm, Left arm, Abdomen, Front perineal area, Right upper leg, Left upper leg, Buttocks, Chest, Right lower leg, Left lower leg, Back Body parts bathed by helper: Back Assist Level: Supervision or verbal cues  Function- Upper Body Dressing/Undressing What is the patient wearing?: Button up shirt, Pull over shirt/dress Pull over shirt/dress - Perfomed by patient: Thread/unthread right sleeve, Thread/unthread left sleeve, Pull shirt over trunk, Put head through opening Pull over shirt/dress - Perfomed by helper: Put head through opening Button up shirt - Perfomed by patient: Thread/unthread right sleeve, Pull shirt around back, Button/unbutton shirt Button up shirt - Perfomed by helper: Thread/unthread left sleeve Assist Level: Supervision or verbal cues Function - Lower Body Dressing/Undressing What is the patient wearing?: Pants, Underwear, Socks, Shoes Position: Sitting EOB Underwear - Performed by patient: Thread/unthread left underwear leg, Pull underwear up/down, Thread/unthread right underwear leg Underwear - Performed by helper: Thread/unthread right underwear leg Pants- Performed by patient: Thread/unthread left pants leg, Pull pants up/down, Fasten/unfasten pants, Thread/unthread right pants leg Pants- Performed by helper: Thread/unthread right pants leg Non-skid slipper socks- Performed by patient: Don/doff right sock, Don/doff left sock Socks - Performed by patient: Don/doff right sock, Don/doff left sock Socks - Performed by helper: Don/doff left sock(A with using sock aid ) Shoes - Performed by patient: Don/doff right shoe, Don/doff left shoe Shoes - Performed by helper: Fasten right, Fasten left Assist for footwear: Supervision/touching assist Assist for lower body dressing: Supervision or verbal cues  Function - Toileting Toileting steps completed by patient: Adjust clothing prior  to toileting, Performs perineal hygiene, Adjust clothing after  toileting Toileting steps completed by helper: Performs perineal hygiene, Adjust clothing after toileting Toileting Assistive Devices: Grab bar or rail Assist level: Touching or steadying assistance (Pt.75%)  Function - Toilet Transfers Toilet transfer assistive device: Grab bar Assist level to toilet: Moderate assist (Pt 50 - 74%/lift or lower) Assist level from toilet: Moderate assist (Pt 50 - 74%/lift or lower)  Function - Chair/bed transfer Chair/bed transfer method: Ambulatory Chair/bed transfer assist level: Supervision or verbal cues Chair/bed transfer assistive device: Walker Chair/bed transfer details: Verbal cues for safe use of DME/AE, Verbal cues for technique  Function - Locomotion: Wheelchair Will patient use wheelchair at discharge?: No Wheelchair activity did not occur: Safety/medical concerns Max wheelchair distance: 175 ft Assist Level: Supervision or verbal cues Wheel 50 feet with 2 turns activity did not occur: Safety/medical concerns Assist Level: Supervision or verbal cues Wheel 150 feet activity did not occur: Safety/medical concerns Assist Level: Supervision or verbal cues Function - Locomotion: Ambulation Assistive device: Walker-rolling Max distance: 150 ft  Assist level: Touching or steadying assistance (Pt > 75%) Assist level: Touching or steadying assistance (Pt > 75%) Walk 50 feet with 2 turns activity did not occur: Safety/medical concerns Assist level: Touching or steadying assistance (Pt > 75%) Walk 150 feet activity did not occur: Safety/medical concerns Assist level: Touching or steadying assistance (Pt > 75%) Walk 10 feet on uneven surfaces activity did not occur: Safety/medical concerns  Function - Comprehension Comprehension: Auditory Comprehension assist level: Follows basic conversation/direction with extra time/assistive device  Function - Expression Expression: Verbal Expression assist level: Expresses basic needs/ideas: With extra  time/assistive device  Function - Social Interaction Social Interaction assist level: Interacts appropriately with others with medication or extra time (anti-anxiety, antidepressant).  Function - Problem Solving Problem solving assist level: Solves basic 75 - 89% of the time/requires cueing 10 - 24% of the time  Function - Memory Memory assist level: Recognizes or recalls 50 - 74% of the time/requires cueing 25 - 49% of the time, Recognizes or recalls 75 - 89% of the time/requires cueing 10 - 24% of the time Patient normally able to recall (first 3 days only): Current season, Location of own room, Staff names and faces, That he or she is in a hospital   Medical Problem List and Plan: 1.  Deficits with mobility, self-care, and endurance secondary to recurrent left SDH.   -continue CIR PT, OT, SLP team conf in am  2.  DVT Prophylaxis/Anticoagulation: Mechanical: Sequential compression devices, below knee Bilateral lower extremities 3. Pain Management: On Neurontin for neuropathic symptoms.  4. Mood: Team to provide ego support. LCSW to follow for evaluation and support.  5. Neuropsych: This patient is not fully  capable of making decisions on his  own behalf. 6. Skin/Wound Care: Monitor wound for healing. Routine pressure relief measures.  7. Fluids/Electrolytes/Nutrition: Strict I/O. Maintain adequate nutritional support.  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 PAF: Monitor HR bid. Off anticoagulation due to recurrent bleeds. On metoprolol and lanoxin.  9. [name]/Chronic systolic CHF: Strict I/O. Monitor weights daily. On metoprolol  CXR similar to prior film ~1 wk ago  -stable Filed Weights   03/28/18 0500 03/29/18 0501 03/30/18 1700  Weight: 74.3 kg (163 lb 12.8 oz) 73.6 kg (162 lb 4.1 oz) 72.6 kg (160 lb 0.9 oz)    10. HTN: Monitor BP bid. Continue metoprolol bid.  Vitals:   03/30/18 2012 03/31/18 3295  BP: 127/76 (!) 90/50  Pulse: 88 63  Resp: 18 18   Temp:  98 F (36.7 C)  SpO2: 99% 97%  lower BP this am, appears to be outlier will cont to monitor before med changes 11. Seizure prophylaxis: NS recommended  Keppra bid for 2-3 months. Does not appear overly sedated  12. Insomnia: scheduled trazodone early evening has helped sleep.  Will need to monitor whether this causes any mental status changes or orthostasis                                                      13. OSA: CPAP not effective and was schedule to have sleep study.   oxygen for use at night prn.  14.  Hypoalb- prostat   LOS (Days) 6 A FACE TO FACE EVALUATION WAS PERFORMED  Charlett Blake 03/31/2018, 7:49 AM

## 2018-04-01 ENCOUNTER — Inpatient Hospital Stay (HOSPITAL_COMMUNITY): Payer: Medicare Other | Admitting: Occupational Therapy

## 2018-04-01 ENCOUNTER — Inpatient Hospital Stay (HOSPITAL_COMMUNITY): Payer: Medicare Other

## 2018-04-01 ENCOUNTER — Inpatient Hospital Stay (HOSPITAL_COMMUNITY): Payer: Medicare Other | Admitting: Physical Therapy

## 2018-04-01 LAB — CBC
HCT: 33.5 % — ABNORMAL LOW (ref 39.0–52.0)
Hemoglobin: 11.4 g/dL — ABNORMAL LOW (ref 13.0–17.0)
MCH: 30.9 pg (ref 26.0–34.0)
MCHC: 34 g/dL (ref 30.0–36.0)
MCV: 90.8 fL (ref 78.0–100.0)
Platelets: 197 10*3/uL (ref 150–400)
RBC: 3.69 MIL/uL — ABNORMAL LOW (ref 4.22–5.81)
RDW: 13.3 % (ref 11.5–15.5)
WBC: 7.4 10*3/uL (ref 4.0–10.5)

## 2018-04-01 LAB — BASIC METABOLIC PANEL
Anion gap: 8 (ref 5–15)
BUN: 12 mg/dL (ref 6–20)
CO2: 29 mmol/L (ref 22–32)
Calcium: 8.6 mg/dL — ABNORMAL LOW (ref 8.9–10.3)
Chloride: 96 mmol/L — ABNORMAL LOW (ref 101–111)
Creatinine, Ser: 0.72 mg/dL (ref 0.61–1.24)
GFR calc Af Amer: >60 mL/min (ref >60)
GFR calc non Af Amer: >60 mL/min (ref >60)
Glucose, Bld: 88 mg/dL (ref 65–99)
Potassium: 3.9 mmol/L (ref 3.5–5.1)
Sodium: 133 mmol/L — ABNORMAL LOW (ref 135–145)

## 2018-04-01 MED ORDER — METHYLPHENIDATE HCL 5 MG PO TABS
5.0000 mg | ORAL_TABLET | Freq: Two times a day (BID) | ORAL | Status: DC
  Administered 2018-04-01 – 2018-04-04 (×6): 5 mg via ORAL
  Filled 2018-04-01 (×6): qty 1

## 2018-04-01 NOTE — Progress Notes (Signed)
Social Work Patient ID: Ryan Savage, male   DOB: [birth year], 84 y.o.   MRN: [id number]  Met with pt and wife to discuss team conference goals supervision level and discharge 5/25. Both are pleased with his progress and pt will use the rolling walker at home to be safer. Both aware of his safety risk if he doesn't use the walker. Will resume [facility] services since he was an active pt. Has all equipment. Work toward Sat discharge.

## 2018-04-01 NOTE — Progress Notes (Signed)
Physical Therapy Weekly Progress Note  Patient Details  Name: Ryan Savage MRN: [id number] Date of Birth: 10/30/1929  Beginning of progress report period: Mar 26, 2018 End of progress report period: Apr 01, 2018  Today's Date: 04/01/2018  Patient has met 3 of 3 short term goals.  Pt is making progress towards LTG's. Pt is beginning to ambulate with RW and close supervision and negotiates stairs with 1 rail and min assist. Pt's family has been present during sessions and education is ongoing as this therapist is recommending pt d/c home with very close supervision. Pt continues to demonstrate impaired emergent & anticipatory awareness, as well as memory and problem solving. Anticipate that pt will need significant cognitive assistance upon d/c to maintain safety with functional mobility. Pt would benefit from continued skilled PT treatment to focus on stair negotiation, transfers, balance, NMR, strengthening, and ongoing pt & family education.  Patient continues to demonstrate the following deficits muscle weakness, decreased cardiorespiratoy endurance, decreased coordination, decreased attention, decreased awareness, decreased problem solving, decreased safety awareness, decreased memory and delayed processing, and decreased standing balance, decreased postural control, hemiplegia and decreased balance strategies and therefore will continue to benefit from skilled PT intervention to increase functional independence with mobility.  Patient progressing toward long term goals..  Continue plan of care. Wheelchair goals discharged as pt anticipated to d/c at ambulatory level.  PT Short Term Goals Week 1:  PT Short Term Goal 1 (Week 1): Pt will negotiate 3 steps + 3 steps with 1 rail & mod assist for home access. PT Short Term Goal 1 - Progress (Week 1): Met PT Short Term Goal 2 (Week 1): Pt will ambulate 75 ft with LRAD & min assist. PT Short Term Goal 2 - Progress (Week 1): Met PT Short Term Goal 3  (Week 1): Pt will perform bed mobility with supervision PT Short Term Goal 3 - Progress (Week 1): Met Week 2:  PT Short Term Goal 1 (Week 2): STG = LTG due to ELOS.   Therapy Documentation Precautions:  Precautions Precautions: Fall Restrictions Weight Bearing Restrictions: No   See Function Navigator for Current Functional Status.  Therapy/Group: Individual Therapy  [name] 04/01/2018, 8:00 AM

## 2018-04-01 NOTE — Progress Notes (Signed)
Subjective/Complaints: Pt states "Audie Stayer said I could have staples out today" Pt states he slept poorly but the sleep graph shows 4 hours plus 4 hours last noc   ROS: Patient denies fever, rash, sore throat, blurred vision, nausea, vomiting, diarrhea, cough, shortness of breath or chest pain, joint or back pain, headache, or mood change.    Objective: Vital Signs: Blood pressure 95/67, pulse 75, temperature 98 F (36.7 C), resp. rate 18, height 5' 10"  (1.778 m), weight 77.8 kg (171 lb 8.3 oz), SpO2 98 %. No results found. Results for orders placed or performed during the hospital encounter of 03/25/18 (from the past 72 hour(s))  Basic metabolic panel     Status: Abnormal   Collection Time: 04/01/18  5:36 AM  Result Value Ref Range   Sodium 133 (L) 135 - 145 mmol/L   Potassium 3.9 3.5 - 5.1 mmol/L   Chloride 96 (L) 101 - 111 mmol/L   CO2 29 22 - 32 mmol/L   Glucose, Bld 88 65 - 99 mg/dL   BUN 12 6 - 20 mg/dL   Creatinine, Ser 0.72 0.61 - 1.24 mg/dL   Calcium 8.6 (L) 8.9 - 10.3 mg/dL   GFR calc non Af Amer >60 >60 mL/min   GFR calc Af Amer >60 >60 mL/min    Comment: (NOTE) The eGFR has been calculated using the CKD EPI equation. This calculation has not been validated in all clinical situations. eGFR's persistently <60 mL/min signify possible Chronic Kidney Disease.    Anion gap 8 5 - 15    Comment: Performed at [facility] [address] [place], [place] [address]  CBC     Status: Abnormal   Collection Time: 04/01/18  5:36 AM  Result Value Ref Range   WBC 7.4 4.0 - 10.5 K/uL   RBC 3.69 (L) 4.22 - 5.81 MIL/uL   Hemoglobin 11.4 (L) 13.0 - 17.0 g/dL   HCT 33.5 (L) 39.0 - 52.0 %   MCV 90.8 78.0 - 100.0 fL   MCH 30.9 26.0 - 34.0 pg   MCHC 34.0 30.0 - 36.0 g/dL   RDW 13.3 11.5 - 15.5 %   Platelets 197 150 - 400 K/uL    Comment: Performed at [facility], [place] [address], [place], [place] [address]     Constitutional: No distress . Vital signs  reviewed. HEENT: EOMI, oral membranes moist Neck: supple Cardiovascular: RRR without murmur. No JVD    Respiratory: CTA Bilaterally without wheezes or rales. Normal effort    GI: BS +, non-tender, non-distended  Extremity:  Pulses positive and No Edema Skin:   Intact Neuro: Alert/Oriented to self and place.  and Abnormal Motor 4/5 RIght delt, Bi, Tri, Grip , HF, KE ADF Musc/Skel:  Other no pain with UE or LE ROM     Assessment/Plan: 1. Functional deficits secondary to recurrent Left SDH with Right hemiparesis which require 3+ hours per day of interdisciplinary therapy in a comprehensive inpatient rehab setting. Physiatrist is providing close team supervision and 24 hour management of active medical problems listed below. Physiatrist and rehab team continue to assess barriers to discharge/monitor patient progress toward functional and medical goals. FIM: Function - Bathing Position: Shower Body parts bathed by patient: Right arm, Left arm, Abdomen, Front perineal area, Right upper leg, Left upper leg, Buttocks, Chest, Right lower leg, Left lower leg Body parts bathed by helper: Back Assist Level: Supervision or verbal cues  Function- Upper Body Dressing/Undressing What is the patient  wearing?: Button up shirt, Pull over shirt/dress Pull over shirt/dress - Perfomed by patient: Thread/unthread right sleeve, Thread/unthread left sleeve, Pull shirt over trunk, Put head through opening Pull over shirt/dress - Perfomed by helper: Put head through opening Button up shirt - Perfomed by patient: Thread/unthread right sleeve, Pull shirt around back, Button/unbutton shirt, Thread/unthread left sleeve Button up shirt - Perfomed by helper: Thread/unthread left sleeve Assist Level: Supervision or verbal cues Function - Lower Body Dressing/Undressing What is the patient wearing?: Pants, Underwear, Socks, Shoes Position: Other (comment)(Couch) Underwear - Performed by patient: Thread/unthread left  underwear leg, Pull underwear up/down, Thread/unthread right underwear leg Underwear - Performed by helper: Thread/unthread right underwear leg Pants- Performed by patient: Thread/unthread left pants leg, Pull pants up/down, Fasten/unfasten pants, Thread/unthread right pants leg Pants- Performed by helper: Thread/unthread right pants leg Non-skid slipper socks- Performed by patient: Don/doff right sock, Don/doff left sock Socks - Performed by patient: Don/doff right sock, Don/doff left sock Socks - Performed by helper: Don/doff left sock(A with using sock aid ) Shoes - Performed by patient: Don/doff right shoe, Don/doff left shoe Shoes - Performed by helper: Fasten right, Fasten left Assist for footwear: Supervision/touching assist Assist for lower body dressing: Supervision or verbal cues  Function - Toileting Toileting steps completed by patient: Adjust clothing prior to toileting, Performs perineal hygiene, Adjust clothing after toileting Toileting steps completed by helper: Performs perineal hygiene, Adjust clothing after toileting Toileting Assistive Devices: Grab bar or rail Assist level: Supervision or verbal cues  Function - Toilet Transfers Toilet transfer assistive device: Grab bar, Elevated toilet seat/BSC over toilet Assist level to toilet: Supervision or verbal cues Assist level from toilet: Supervision or verbal cues  Function - Chair/bed transfer Chair/bed transfer method: Ambulatory Chair/bed transfer assist level: Supervision or verbal cues Chair/bed transfer assistive device: Walker Chair/bed transfer details: Verbal cues for safe use of DME/AE, Verbal cues for technique  Function - Locomotion: Wheelchair Will patient use wheelchair at discharge?: No Wheelchair activity did not occur: Safety/medical concerns Max wheelchair distance: 175 ft Assist Level: Supervision or verbal cues Wheel 50 feet with 2 turns activity did not occur: Safety/medical concerns Assist  Level: Supervision or verbal cues Wheel 150 feet activity did not occur: Safety/medical concerns Assist Level: Supervision or verbal cues Function - Locomotion: Ambulation Assistive device: Walker-rolling Max distance: 150 ft  Assist level: Supervision or verbal cues Assist level: Supervision or verbal cues Walk 50 feet with 2 turns activity did not occur: Safety/medical concerns Assist level: Supervision or verbal cues Walk 150 feet activity did not occur: Safety/medical concerns Assist level: Supervision or verbal cues Walk 10 feet on uneven surfaces activity did not occur: Safety/medical concerns  Function - Comprehension Comprehension: Auditory Comprehension assist level: Follows basic conversation/direction with extra time/assistive device  Function - Expression Expression: Verbal Expression assist level: Expresses basic needs/ideas: With extra time/assistive device  Function - Social Interaction Social Interaction assist level: Interacts appropriately with others with medication or extra time (anti-anxiety, antidepressant).  Function - Problem Solving Problem solving assist level: Solves basic 25 - 49% of the time - needs direction more than half the time to initiate, plan or complete simple activities  Function - Memory Memory assist level: Recognizes or recalls 50 - 74% of the time/requires cueing 25 - 49% of the time Patient normally able to recall (first 3 days only): Current season, Location of own room, Staff names and faces, That he or she is in a hospital   Medical Problem List and Plan:  1.  Deficits with mobility, self-care, and endurance secondary to recurrent left SDH.   -continue CIR PT, OT, SLP , Team conference today please see physician documentation under team conference tab, met with team face-to-face to discuss problems,progress, and goals. Formulized individual treatment plan based on medical history, underlying problem and comorbidities.  2.  DVT  Prophylaxis/Anticoagulation: Mechanical: Sequential compression devices, below knee Bilateral lower extremities 3. Pain Management: On Neurontin for neuropathic symptoms.  4. Mood: Team to provide ego support. LCSW to follow for evaluation and support.  5. Neuropsych: This patient is not fully  capable of making decisions on his  own behalf. 6. Skin/Wound Care: Monitor wound for healing. Routine pressure relief measures.  7. Fluids/Electrolytes/Nutrition: Strict I/O. Maintain adequate nutritional support.  - chronic SIADH   -recheck 133 on 5/22 8. PAF: Monitor HR bid. Off anticoagulation due to recurrent bleeds. On metoprolol and lanoxin.  9. [name]/Chronic systolic CHF: Strict I/O. Monitor weights daily. On metoprolol  CXR similar to prior film ~1 wk ago  -stable Filed Weights   03/30/18 1700 03/31/18 2016 04/01/18 0549  Weight: 72.6 kg (160 lb 0.9 oz) 79.6 kg (175 lb 7.8 oz) 77.8 kg (171 lb 8.3 oz)    10. HTN: Monitor BP bid. Continue metoprolol bid.  Vitals:   03/31/18 1452 04/01/18 0549  BP: 124/76 95/67  Pulse: 60 75  Resp: 16 18  Temp: 98.7 F (37.1 C) 98 F (36.7 C)  SpO2: 100% 98%  COntrolled , systolic on lower side will check orthostatic vitals 11. Seizure prophylaxis: NS recommended  Keppra bid for 2-3 months. Does not appear overly sedated  12. Insomnia: scheduled trazodone early evening has helped sleep.  Will need to monitor whether this causes any mental status changes or orthostasis                                                      13. OSA: CPAP not effective and was schedule to have sleep study.   oxygen for use at night prn.  14.  Hypoalb- prostat   LOS (Days) 7 A FACE TO FACE EVALUATION WAS PERFORMED  Charlett Blake 04/01/2018, 8:36 AM

## 2018-04-01 NOTE — Patient Care Conference (Signed)
Inpatient RehabilitationTeam Conference and Plan of Care Update Date: 04/01/2018   Time: 10:30 AM    Patient Name: Ryan Savage      Medical Record Number: [id number]  Date of Birth: [birth year] Sex: Male         Room/Bed: 4W15C/4W15C-01 Payor Info: Payor: [employer] MEDICARE / Plan: [facility] MEDICARE / Product Type: *No Product type* /    Admitting Diagnosis: SAH  Admit Date/Time:  03/25/2018  3:47 PM Admission Comments: No comment available   Primary Diagnosis:  <principal problem not specified> Principal Problem: <principal problem not specified>  Patient Active Problem List   Diagnosis Date Noted  . Subdural hemorrhage ([place]) 03/25/2018  . Subdural hematoma without coma ([place])   . Chronic systolic CHF (congestive heart failure) ([place])   . Seizure prophylaxis   . Insomnia   . PAF (paroxysmal atrial fibrillation) ([place])   . Thrombocytopenia ([place])   . History of subdural hematoma   . Tachypnea   . 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 Protein-calorie malnutrition, severe 03/20/2018  . Traumatic subdural hematoma ([place]) 02/24/2018  . Acute blood loss anemia   . Steroid-induced hyperglycemia   . 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 Chronic systolic CHF (congestive heart failure), NYHA class 2 ([place]) 02/20/2018  . Hypertension 02/20/2018  . Chronic atrial fibrillation ([place]) 02/20/2018  . Coronary artery disease 02/20/2018  . Mild mitral valve regurgitation 02/20/2018  . Subdural hematoma ([place]) 02/20/2018  . [name] (coronary artery disease), native coronary artery   . SDH (subdural hematoma) ([place]) 01/25/2018  . Bilateral subdural hematomas ([place]) 01/23/2018  . Malnutrition of moderate degree 12/10/2017  . Cerebral embolism with cerebral infarction 12/09/2017  . Odynophagia 12/08/2017  . Syncope and collapse 12/08/2017  . Hyperlipidemia 12/08/2017  . OSA (obstructive sleep apnea) 12/08/2017  . Dysarthria 12/08/2017  . [name] (coronary artery disease) 12/08/2017   . Chronic systolic heart failure ([place]) 12/08/2017  . Syndrome of inappropriate ADH (SIADH) secretion ([place]) 12/08/2017  . SIADH (syndrome of inappropriate ADH production) ([place])   . Subclinical hyperthyroidism   . Hyponatremia with excess extracellular fluid volume 11/18/2017  . Hyperglycemia 11/18/2017  . Supratherapeutic INR 11/18/2017  . Mitral valve prolapse   . Permanent atrial fibrillation ([place]) 11/08/2013  . Benign essential HTN 11/08/2013  . Mitral regurgitation 11/08/2013    Expected Discharge Date: Expected Discharge Date: 04/04/18  Team Members Present: Physician leading conference: Dr. Alysia Penna Social Worker Present: Ovidio Kin, LCSW Nurse Present: Other (comment)(Katelynn Perkins-RN) PT Present: Lavone Nian, PT OT Present: Napoleon Form, OT SLP Present: Charolett Bumpers, SLP PPS Coordinator present : Daiva Nakayama, RN, [facility]     Current Status/Progress Goal Weekly Team Focus  Medical   Poor memory and atention, [facility] 16/30  memeory  improve attention  trial ritalin   Bowel/Bladder   continentof B/B  Maintain continence  Assist with toileting as needed   Swallow/Nutrition/ Hydration             ADL's   Close supervision-occasional min A overall. Frequent cuing for safety awareness and RW management in functional context  Supervision overall  ADL re-training, family education, d/c planning   Mobility   min assist overall with RW, impaired awareness/cognition/memory/problem solving  supervision overall with LRAD except min assist stair negotiation  NMR, pt/family education, d/c planning, transfers, gait, strengthening   Communication             Safety/Cognition/ Behavioral Observations  Mod A   Supervision A  safety awareness, education,  functional problem solving, recall strategies, attention    Pain   Denies Pain  <2  Assess Qshift and PRN   Skin   Staples to head, left side  Prevent infection/skin breakdown  Assess Qshift and PRN      *See Care  Plan and progress notes for long and short-term goals.     Barriers to Discharge  Current Status/Progress Possible Resolutions Date Resolved   Physician    Medical stability;Decreased caregiver support  recurrent falls  safety remains an issue  trial ritalin      Nursing                  PT  Decreased caregiver support  unsure of pt's wife's ability to provide necessary 24 hr supervision              OT                  SLP                SW                Discharge Planning/Teaching Needs:  Wife is here daily and observes pt in therapies, seems pt does what he wants even if unsafe. Hopefully will use rolling walker at home.      Team Discussion:  Goals supervision level due to safety issues and some memory issues. [place] 16/30. Speech reports attention and memory issues. Staples to be removed today. Medically stable for DC Sat. Wife here daily to go through therapies with  Revisions to Treatment Plan:  DC 5/25    Continued Need for Acute Rehabilitation Level of Care: The patient requires daily medical management by a physician with specialized training in physical medicine and rehabilitation for the following conditions: Daily direction of a multidisciplinary physical rehabilitation program to ensure safe treatment while eliciting the highest outcome that is of practical value to the patient.: Yes Daily medical management of patient stability for increased activity during participation in an intensive rehabilitation regime.: Yes Daily analysis of laboratory values and/or radiology reports with any subsequent need for medication adjustment of medical intervention for : Neurological problems;Post surgical problems  Avonda Toso, [place] 04/01/2018, 12:37 PM

## 2018-04-01 NOTE — Plan of Care (Signed)
  Problem: Consults Goal: RH BRAIN INJURY PATIENT EDUCATION Description Description: See Patient Education module for eduction specifics Outcome: Progressing   Problem: RH BLADDER ELIMINATION Goal: RH STG MANAGE BLADDER WITH ASSISTANCE Description STG Manage Bladder With Mod I Assistance  Outcome: Progressing   Problem: RH SKIN INTEGRITY Goal: RH STG SKIN FREE OF INFECTION/BREAKDOWN Description No new breakdown with supervision assist   Outcome: Progressing Goal: RH STG ABLE TO PERFORM INCISION/WOUND CARE W/ASSISTANCE Description STG Able To Perform Incision/Wound Care With Mod I Assistance.  Outcome: Progressing   Problem: RH SAFETY Goal: RH STG ADHERE TO SAFETY PRECAUTIONS W/ASSISTANCE/DEVICE Description STG Adhere to Safety Precautions With Supervision Assistance/Device.  Outcome: Progressing   Problem: RH COGNITION-NURSING Goal: RH STG USES MEMORY AIDS/STRATEGIES W/ASSIST TO PROBLEM SOLVE Description STG Uses Memory Aids/Strategies With Min Assistance to Problem Solve.  Outcome: Progressing

## 2018-04-01 NOTE — Progress Notes (Signed)
Physical Therapy Session Note  Patient Details  Name: Ryan Savage MRN: [id number] Date of Birth: September 30, 1929  Today's Date: 04/01/2018 PT Individual Time: 1165-7903 PT Individual Time Calculation (min): 78 min   Short Term Goals: Week 2:  PT Short Term Goal 1 (Week 2): STG = LTG due to ELOS.  Skilled Therapeutic Interventions/Progress Updates:  Pt received in recliner with wife Enid Derry) present for session. No c/o pain reported. Therapist thoroughly educates pt & wife on SDH and impaired brain/body connection in regards to pt's LE weakness; they voice understanding but unsure of how much pt and wife fully process information. Pt continues to require max cuing to wait for someone to be beside him before transferring sit>stand, cuing for hand placement for safe sit<>stand transfers, and to limit conversation while talking (pt with poor recall of this education). Pt completes bed mobility without rails & bed flat with supervision. Pt ambulates room>gym>dayroom>room with RW & wife providing assistance, appropriately guarding pt on the R side. Pt & wife report pt enjoys sitting on front porch and there's a single threshold to access porch. Therapist provided demonstration, education, and handout regarding sequencing of RW & stepping up/down step to access porch. Pt & therapist then pt & wife return demonstrated single step negotiation (3-4" tall). Pt & wife require max cuing from therapist for proper sequencing as they were unable to demonstrate this despite printed handout and demonstration. Pt utilized nu-step on level 3 x 8 minutes with BLE only with task focusing on BLE strengthening as pt & wife would like to focus on this. At end of session pt left sitting in recliner with quick release belt & chair alarm donned, all needs within reach, & wife present.  Based on hands-on caregiver training today this therapist does not believe pt's wife is capable of providing the necessary cognitive assistance the  pt needs due to his significant cognitive deficits, LCSW Jacqlyn Larsen) made aware.  Therapy Documentation Precautions:  Precautions Precautions: Fall Restrictions Weight Bearing Restrictions: No    See Function Navigator for Current Functional Status.   Therapy/Group: Individual Therapy  [name] 04/01/2018, 2:29 PM

## 2018-04-01 NOTE — Progress Notes (Addendum)
Speech Language Pathology Daily Session Note  Patient Details  Name: Ryan Savage MRN: [id number] Date of Birth: [birth year]  Today's Date: 04/01/2018 SLP Individual Time: 0930-1030 SLP Individual Time Calculation (min): 60 min  Short Term Goals: Week 1: SLP Short Term Goal 1 (Week 1): Patient will demonstrate functional problem solving for functional and familiar tasks with Min A verbal cues.  SLP Short Term Goal 2 (Week 1): Patient will utilize external aids to recall new, daily information with Min A verbal cues.  SLP Short Term Goal 3 (Week 1): Patient will self-monitor and correct errors during functional tasks with Min A verbal cues.   Skilled Therapeutic Interventions:Skilled ST services focused on cognitive skills. SLP facilitated further investigation of cognitive impairments and to incraese awareness of deficits with pt and wife, who was present. Pt scored 16 out 30 on MOCA version 7.3 (n=>26) main deficit of sustained attention and immediate recall impacting functional to mildly complex problem solving and emergent awareness. Pt and wife agreed that assessment helped "open our eyes" to pt's current deficits. SLP downgraded LTG due to short ELOS and continued education should be provided at next session. Pt required min A verbal cues to utilize memorybook to recall yesterdays events.Pt was left in room with call bell within reach. Reccomend to continue skilled ST services.      Function:  Eating Eating                 Cognition Comprehension Comprehension assist level: Understands basic 90% of the time/cues < 10% of the time  Expression   Expression assist level: Expresses basic needs/ideas: With extra time/assistive device  Social Interaction Social Interaction assist level: Interacts appropriately with others with medication or extra time (anti-anxiety, antidepressant).  Problem Solving Problem solving assist level: Solves basic 50 - 74% of the time/requires cueing 25 -  49% of the time  Memory Memory assist level: Recognizes or recalls 50 - 74% of the time/requires cueing 25 - 49% of the time    Pain Pain Assessment Pain Scale: 0-10 Pain Score: 0-No pain  Therapy/Group: Individual Therapy  Trevyon Swor  [facility] 04/01/2018, 11:49 AM

## 2018-04-01 NOTE — Progress Notes (Signed)
Occupational Therapy Session Note  Patient Details  Name: Ryan Savage MRN: [id number] Date of Birth: 1929-10-31  Today's Date: 04/01/2018 OT Individual Time: 7672-0947 OT Individual Time Calculation (min): 60 min    Short Term Goals: Week 1:  OT Short Term Goal 1 (Week 1): Pt will complete 3/3 toileting tasks with guarding assist OT Short Term Goal 2 (Week 1): Pt will complete two grooming tasks in standing position with CGA and no seated rest break in order to increase functional activity tolerance OT Short Term Goal 3 (Week 1): Pt will ambulate into bathroom to complete ADLs with CGA OT Short Term Goal 4 (Week 1): Pt will recall proper hand placement on RW during functional transfers consistently at mod I level  Skilled Therapeutic Interventions/Progress Updates:    Pt seen for OT ADL bathing/dressing session. Pt asleep in supine upon arrival, easily awoken to stimuli and agreeable to tx session. Initially pt with increased word finding, dysarthria, and confusion, however, improved throughout session. He ambulated with RW and close supervision throughout room and into bathroom, cont to require VCs for proper RW management in functional context. He bathed seated on tub bench, standing with support of grab bars to complete pericare/buttock hygiene. When drying off, pt attempting to stand on one leg in wet shower in order to dry off, requiring cues for safety awareness.  He returned to couch to dress, required significantly increased time and VCs for problem solving clothing orientation of pull over shirt, able to correctly orient and don button down shirt in reasonable amount of time. Cuing required for use of RW when standing to pull pants up. He ambulated to sink to complete grooming tasks standing at sink, tolerating ~2 minutes in standing before requiring seated rest break.  Pt returned to recliner at end of session, left seated with QRB and chair alarm on, set-up with breakfast.   Throughout session, required VCs for safety awareness, attention to task for sequencing/cogniton of basic tasks. Planning for family ed tomorrow in prep for d/c.   Therapy Documentation Precautions:  Precautions Precautions: Fall Restrictions Weight Bearing Restrictions: No Pain: Pain Assessment Pain Scale: 0-10 Pain Score: 0-No pain  See Function Navigator for Current Functional Status.   Therapy/Group: Individual Therapy  Dallys Nowakowski L 04/01/2018, 7:08 AM

## 2018-04-02 ENCOUNTER — Inpatient Hospital Stay (HOSPITAL_COMMUNITY): Payer: Medicare Other | Admitting: Occupational Therapy

## 2018-04-02 ENCOUNTER — Inpatient Hospital Stay (HOSPITAL_COMMUNITY): Payer: Medicare Other

## 2018-04-02 ENCOUNTER — Inpatient Hospital Stay (HOSPITAL_COMMUNITY): Payer: Medicare Other | Admitting: Speech Pathology

## 2018-04-02 NOTE — Progress Notes (Signed)
Subjective/Complaints:  No issues overnite, discussed d/c date  ROS: Patient denies fever, rash, sore throat, blurred vision, nausea, vomiting, diarrhea, cough, shortness of breath or chest pain, joint or back pain, headache, or mood change.    Objective: Vital Signs: Blood pressure 102/60, pulse 86, temperature 98 F (36.7 C), temperature source Oral, resp. rate 16, height 5' 10"  (1.778 m), weight 78.5 kg (173 lb 1 oz), SpO2 99 %. No results found. Results for orders placed or performed during the hospital encounter of 03/25/18 (from the past 72 hour(s))  Basic metabolic panel     Status: Abnormal   Collection Time: 04/01/18  5:36 AM  Result Value Ref Range   Sodium 133 (L) 135 - 145 mmol/L   Potassium 3.9 3.5 - 5.1 mmol/L   Chloride 96 (L) 101 - 111 mmol/L   CO2 29 22 - 32 mmol/L   Glucose, Bld 88 65 - 99 mg/dL   BUN 12 6 - 20 mg/dL   Creatinine, Ser 0.72 0.61 - 1.24 mg/dL   Calcium 8.6 (L) 8.9 - 10.3 mg/dL   GFR calc non Af Amer >60 >60 mL/min   GFR calc Af Amer >60 >60 mL/min    Comment: (NOTE) The eGFR has been calculated using the CKD EPI equation. This calculation has not been validated in all clinical situations. eGFR's persistently <60 mL/min signify possible Chronic Kidney Disease.    Anion gap 8 5 - 15    Comment: Performed at [facility] [address], [place], [place] [address]  CBC     Status: Abnormal   Collection Time: 04/01/18  5:36 AM  Result Value Ref Range   WBC 7.4 4.0 - 10.5 K/uL   RBC 3.69 (L) 4.22 - 5.81 MIL/uL   Hemoglobin 11.4 (L) 13.0 - 17.0 g/dL   HCT 33.5 (L) 39.0 - 52.0 %   MCV 90.8 78.0 - 100.0 fL   MCH 30.9 26.0 - 34.0 pg   MCHC 34.0 30.0 - 36.0 g/dL   RDW 13.3 11.5 - 15.5 %   Platelets 197 150 - 400 K/uL    Comment: Performed at [facility], [place] [address] [place], [place] [address]     Constitutional: No distress . Vital signs reviewed. HEENT: EOMI, oral membranes moist Neck: supple Cardiovascular: RRR without  murmur. No JVD    Respiratory: CTA Bilaterally without wheezes or rales. Normal effort    GI: BS +, non-tender, non-distended  Extremity:  Pulses positive and No Edema Skin:   Intact Neuro: Alert/Oriented to self and place.  and Abnormal Motor 4/5 RIght delt, Bi, Tri, Grip , HF, KE ADF Musc/Skel:  Other no pain with UE or LE ROM     Assessment/Plan: 1. Functional deficits secondary to recurrent Left SDH with Right hemiparesis which require 3+ hours per day of interdisciplinary therapy in a comprehensive inpatient rehab setting. Physiatrist is providing close team supervision and 24 hour management of active medical problems listed below. Physiatrist and rehab team continue to assess barriers to discharge/monitor patient progress toward functional and medical goals. FIM: Function - Bathing Position: Shower Body parts bathed by patient: Right arm, Left arm, Abdomen, Front perineal area, Right upper leg, Left upper leg, Buttocks, Chest, Right lower leg, Left lower leg Body parts bathed by helper: Back Assist Level: Supervision or verbal cues  Function- Upper Body Dressing/Undressing What is the patient wearing?: Button up shirt, Pull over shirt/dress Pull over shirt/dress - Perfomed by patient: Thread/unthread right sleeve, Thread/unthread  left sleeve, Pull shirt over trunk, Put head through opening Pull over shirt/dress - Perfomed by helper: Put head through opening Button up shirt - Perfomed by patient: Thread/unthread right sleeve, Pull shirt around back, Button/unbutton shirt, Thread/unthread left sleeve Button up shirt - Perfomed by helper: Thread/unthread left sleeve Assist Level: Supervision or verbal cues Function - Lower Body Dressing/Undressing What is the patient wearing?: Pants, Underwear, Socks, Shoes Position: Other (comment)(Couch) Underwear - Performed by patient: Thread/unthread left underwear leg, Pull underwear up/down, Thread/unthread right underwear leg Underwear -  Performed by helper: Thread/unthread right underwear leg Pants- Performed by patient: Thread/unthread left pants leg, Pull pants up/down, Fasten/unfasten pants, Thread/unthread right pants leg Pants- Performed by helper: Thread/unthread right pants leg Non-skid slipper socks- Performed by patient: Don/doff right sock, Don/doff left sock Socks - Performed by patient: Don/doff right sock, Don/doff left sock Socks - Performed by helper: Don/doff left sock(A with using sock aid ) Shoes - Performed by patient: Don/doff right shoe, Don/doff left shoe Shoes - Performed by helper: Fasten right, Fasten left Assist for footwear: Supervision/touching assist Assist for lower body dressing: Supervision or verbal cues  Function - Toileting Toileting steps completed by patient: Adjust clothing prior to toileting, Performs perineal hygiene, Adjust clothing after toileting Toileting steps completed by helper: Performs perineal hygiene, Adjust clothing after toileting Toileting Assistive Devices: Grab bar or rail Assist level: Supervision or verbal cues  Function - Toilet Transfers Toilet transfer assistive device: Grab bar, Elevated toilet seat/BSC over toilet Assist level to toilet: Supervision or verbal cues Assist level from toilet: Supervision or verbal cues  Function - Chair/bed transfer Chair/bed transfer method: Stand pivot Chair/bed transfer assist level: Touching or steadying assistance (Pt > 75%) Chair/bed transfer assistive device: Walker Chair/bed transfer details: Verbal cues for sequencing  Function - Locomotion: Wheelchair Will patient use wheelchair at discharge?: No Wheelchair activity did not occur: Safety/medical concerns Max wheelchair distance: 175 ft Assist Level: Supervision or verbal cues Wheel 50 feet with 2 turns activity did not occur: Safety/medical concerns Assist Level: Supervision or verbal cues Wheel 150 feet activity did not occur: Safety/medical concerns Assist  Level: Supervision or verbal cues Function - Locomotion: Ambulation Assistive device: Walker-rolling Max distance: 150 ft  Assist level: Supervision or verbal cues Assist level: Supervision or verbal cues Walk 50 feet with 2 turns activity did not occur: Safety/medical concerns Assist level: Supervision or verbal cues Walk 150 feet activity did not occur: Safety/medical concerns Assist level: Supervision or verbal cues Walk 10 feet on uneven surfaces activity did not occur: Safety/medical concerns  Function - Comprehension Comprehension: Auditory Comprehension assist level: Understands basic 90% of the time/cues < 10% of the time  Function - Expression Expression: Verbal Expression assist level: Expresses basic needs/ideas: With extra time/assistive device  Function - Social Interaction Social Interaction assist level: Interacts appropriately with others with medication or extra time (anti-anxiety, antidepressant).  Function - Problem Solving Problem solving assist level: Solves basic 50 - 74% of the time/requires cueing 25 - 49% of the time  Function - Memory Memory assist level: Recognizes or recalls 50 - 74% of the time/requires cueing 25 - 49% of the time, Recognizes or recalls 75 - 89% of the time/requires cueing 10 - 24% of the time Patient normally able to recall (first 3 days only): Current season, Location of own room, Staff names and faces, That he or she is in a hospital   Medical Problem List and Plan: 1.  Deficits with mobility, self-care, and endurance secondary  to recurrent left SDH.   -continue CIR PT, OT, SLP ,discussed d/c date  2.  DVT Prophylaxis/Anticoagulation: Mechanical: Sequential compression devices, below knee Bilateral lower extremities 3. Pain Management: On Neurontin for neuropathic symptoms.  4. Mood: Team to provide ego support. LCSW to follow for evaluation and support.  5. Neuropsych: This patient is not fully  capable of making decisions on his   own behalf. 6. Skin/Wound Care: Monitor wound for healing. Routine pressure relief measures.  7. Fluids/Electrolytes/Nutrition: Strict I/O. Maintain adequate nutritional support.  - chronic SIADH   -recheck 133 on 5/22 8. PAF: Monitor HR bid. Off anticoagulation due to recurrent bleeds. On metoprolol and lanoxin.  9. [name]/Chronic systolic CHF: Strict I/O. Monitor weights daily. On metoprolol  CXR similar to prior film ~1 wk ago  -stable Filed Weights   03/31/18 2016 04/01/18 0549 04/02/18 0500  Weight: 79.6 kg (175 lb 7.8 oz) 77.8 kg (171 lb 8.3 oz) 78.5 kg (173 lb 1 oz)    10. HTN: Monitor BP bid. Continue metoprolol bid.  Vitals:   04/01/18 1953 04/02/18 0500  BP: (!) 135/91 102/60  Pulse: 90 86  Resp: 17 16  Temp: 98.6 F (37 C) 98 F (36.7 C)  SpO2: 100% 99%  Controlled , systolic on lower side will check orthostatic vitals, no dizziness 11. Seizure prophylaxis: NS recommended  Keppra bid for 2-3 months. Does not appear overly sedated  12. Insomnia: scheduled trazodone early evening has helped sleep.  Will need to monitor whether this causes any mental status changes or orthostasis                                                      13. OSA: CPAP not effective and was schedule to have sleep study.   oxygen for use at night prn.  14.  Hypoalb- prostat 15.  Poor attention , forgetfulness, trail Ritalin- touch base with therapy, SLP to determine effect and whether pt needs Rx for home use  LOS (Days) 8 A FACE TO FACE EVALUATION WAS PERFORMED  Charlett Blake 04/02/2018, 7:47 AM

## 2018-04-02 NOTE — Progress Notes (Signed)
Physical Therapy Note  Patient Details  Name: Ryan Savage MRN: [id number] Date of Birth: [birth year] Today's Date: 04/02/2018    Time: 3646-8032 14 minutes MAKE UP TIME  1:1 No c/o pain.  Pt and spouse educated on and given handout of [place].  Pt performed all exercises x 10 with steadying assist for balance.  Pt left in recliner with alarm set, wife present, needs at hand.   [name] 04/02/2018, 2:14 PM

## 2018-04-02 NOTE — Progress Notes (Addendum)
Physical Therapy Note  Patient Details  Name: Ryan Savage MRN: [id number] Date of Birth: 30-May-1929 Today's Date: 04/02/2018  [id number], 60 min individual tx Pain: none per pt Missed 15 min tx due to unavailability of PT  Pt with confused speech, communicating that he wanted to get out of the bed, with obvious word finding problems and word substitutions.  Pt stated " this 3rd brain bleed I had really affected my brain." .  Bed mobility and stand pivot transfer with close supervision.  VCs q sit>< stand for hand placement and start of session; no cues needed by end of session.  Pt dressed sitting EOB, with supervision without cues.    Pt brushed teeth and washed face from w/c level.   W/c propulsion for activity tolerance over level tile, supervision.  Gait training over level tile with RW, weaving in/out of 6 cones without bumping any of them, x 2, close supervision.   Pt benefited from a demo by PT of unsafe use of RW on step, then reminded to sequence it the "way it makes sense".   Up/down curb/step with RW, leading with LLE up and down, but no knee buckling.    Wife Ryan Savage arrived at end of session. She would like a hand-out of exs that pt can do in room; this PT will inform primary PT.   Pt left resting in recliner with quick release belt applied and all needs within reach. PT requested Haylee return to room and place seat alarm under pt.  See function navigator for current status.  Ryan Savage 04/02/2018, 7:56 AM

## 2018-04-02 NOTE — Progress Notes (Signed)
Speech Language Pathology Daily Session Note  Patient Details  Name: Ryan Savage MRN: [id number] Date of Birth: 1929/08/06  Today's Date: 04/02/2018 SLP Individual Time: 1100-1200 SLP Individual Time Calculation (min): 60 min  Short Term Goals: Week 1: SLP Short Term Goal 1 (Week 1): Patient will demonstrate functional problem solving for functional and familiar tasks with Min A verbal cues.  SLP Short Term Goal 2 (Week 1): Patient will utilize external aids to recall new, daily information with Min A verbal cues.  SLP Short Term Goal 3 (Week 1): Patient will self-monitor and correct errors during functional tasks with Min A verbal cues.   Skilled Therapeutic Interventions: Skilled treatment session focused on cognition goals. SLP facilitated session by providing Min  A cues to recall current deficits and Mod A for anticipatory awareness by listing activities that would be safe to perform/not to perform within home environment. Education provided to wife on recommendation for 24 hour supervision and support given in discussion of hiring paid caregiver, high fall risk etc. All questions answered to their satisfaction.      Function:    Cognition Comprehension Comprehension assist level: Understands basic 90% of the time/cues < 10% of the time  Expression   Expression assist level: Expresses basic needs/ideas: With extra time/assistive device  Social Interaction Social Interaction assist level: Interacts appropriately with others with medication or extra time (anti-anxiety, antidepressant).  Problem Solving Problem solving assist level: Solves basic 50 - 74% of the time/requires cueing 25 - 49% of the time;Solves basic 75 - 89% of the time/requires cueing 10 - 24% of the time  Memory Memory assist level: Recognizes or recalls 75 - 89% of the time/requires cueing 10 - 24% of the time    Pain    Therapy/Group: Individual Therapy  [place] Festa 04/02/2018, 5:32 PM

## 2018-04-02 NOTE — Progress Notes (Signed)
Occupational Therapy Session Note  Patient Details  Name: Ryan Savage MRN: [id number] Date of Birth: Sep 05, 1929  Today's Date: 04/02/2018 OT Individual Time: 1300-1400 OT Individual Time Calculation (min): 60 min    Short Term Goals: Week 1:  OT Short Term Goal 1 (Week 1): Pt will complete 3/3 toileting tasks with guarding assist OT Short Term Goal 2 (Week 1): Pt will complete two grooming tasks in standing position with CGA and no seated rest break in order to increase functional activity tolerance OT Short Term Goal 3 (Week 1): Pt will ambulate into bathroom to complete ADLs with CGA OT Short Term Goal 4 (Week 1): Pt will recall proper hand placement on RW during functional transfers consistently at mod I level  Skilled Therapeutic Interventions/Progress Updates:    Pt seen for OT session focusing on family education and ADL re-training. Pt sitting up in recliner upon arrival with wife present for scheduled family ed session. Pt donned shoes seated in recliner. Pt cont to demonstrate difficulty recalling how to use shoe buttons and demonstrates ability to reach towards feet to tie shoes, therefore shoe buttons removed and pt able to tie B shoes.  Pt's wife assisted with all mobility and transfers throughout session, therapist had to cont to prompt pt as to proper hand placement during sit>stand on RW and RW management in functional context. Pt had been incontinent of urine and desired to take shower this session. Prior to shower, demonstration and education provided regarding technique of how to step over shower stall threshold to shower chair. In bathroom, pt's wife assisted him with shower stall transfer, pt unable to recall technique just taught, however, wife able to provide appropriate cuing for transfer technique. Pt bathed seated on tub bench, VCs provided from wife for sequencing of task. Cont to provide education regarding pt's need for close supervision. Pt dressed from standard  chair, standing at RW to pull pants up. He requires VCs throughout session to use RW during sit>stand due to impulsivity and poor memory to use AD.   Recommend use of hand held urinal for night time use as well as keeping a bell or some item near bedside to alert wife during the night if he needs to get to [facility], they sleep in separate rooms. Pt requires close supervision for stand pivot to [facility] and clothing management during toileting tasks.  Pt returned to recliner at end of session, left seated with QRB donned and wife present, all needs in reach.   Therapy Documentation Precautions:  Precautions Precautions: Fall Restrictions Weight Bearing Restrictions: No Pain:   No/denies pain  See Function Navigator for Current Functional Status.   Therapy/Group: Individual Therapy  Gabor Lusk L 04/02/2018, 6:55 AM

## 2018-04-03 ENCOUNTER — Inpatient Hospital Stay (HOSPITAL_COMMUNITY): Payer: Medicare Other | Admitting: Physical Therapy

## 2018-04-03 ENCOUNTER — Inpatient Hospital Stay (HOSPITAL_COMMUNITY): Payer: Medicare Other | Admitting: Occupational Therapy

## 2018-04-03 ENCOUNTER — Inpatient Hospital Stay (HOSPITAL_COMMUNITY): Payer: Medicare Other | Admitting: Speech Pathology

## 2018-04-03 MED ORDER — LEVETIRACETAM 500 MG PO TABS: 500.0000 mg | ORAL_TABLET | Freq: Two times a day (BID) | ORAL | 0 refills | Status: DC

## 2018-04-03 MED ORDER — ADULT MULTIVITAMIN W/MINERALS CH: 1.0000 | ORAL_TABLET | Freq: Every day | ORAL | Status: DC

## 2018-04-03 MED ORDER — GABAPENTIN 300 MG PO CAPS: 300.0000 mg | ORAL_CAPSULE | Freq: Every day | ORAL | 0 refills | Status: DC

## 2018-04-03 MED ORDER — DOCUSATE SODIUM 100 MG PO CAPS: 100.0000 mg | ORAL_CAPSULE | Freq: Two times a day (BID) | ORAL | 0 refills | Status: DC

## 2018-04-03 MED ORDER — ACETAMINOPHEN 325 MG PO TABS: 325.0000 mg | ORAL_TABLET | ORAL | Status: DC | PRN

## 2018-04-03 MED ORDER — METHYLPHENIDATE HCL 5 MG PO TABS: 5.0000 mg | ORAL_TABLET | Freq: Two times a day (BID) | ORAL | 0 refills | Status: DC

## 2018-04-03 NOTE — Plan of Care (Signed)
  Problem: Consults Goal: RH BRAIN INJURY PATIENT EDUCATION Description Description: See Patient Education module for eduction specifics Outcome: Progressing   Problem: RH BLADDER ELIMINATION Goal: RH STG MANAGE BLADDER WITH ASSISTANCE Description STG Manage Bladder With Mod I Assistance  Outcome: Progressing   Problem: RH SKIN INTEGRITY Goal: RH STG SKIN FREE OF INFECTION/BREAKDOWN Description No new breakdown with supervision assist   Outcome: Progressing Goal: RH STG ABLE TO PERFORM INCISION/WOUND CARE W/ASSISTANCE Description STG Able To Perform Incision/Wound Care With Mod I Assistance.  Outcome: Progressing   Problem: RH SAFETY Goal: RH STG ADHERE TO SAFETY PRECAUTIONS W/ASSISTANCE/DEVICE Description STG Adhere to Safety Precautions With Supervision Assistance/Device.  Outcome: Progressing   Problem: RH COGNITION-NURSING Goal: RH STG USES MEMORY AIDS/STRATEGIES W/ASSIST TO PROBLEM SOLVE Description STG Uses Memory Aids/Strategies With Min Assistance to Problem Solve.  Outcome: Progressing

## 2018-04-03 NOTE — Progress Notes (Signed)
Social Work Patient ID: Ryan Savage, male   DOB: Dec 25, 1928, 84 y.o.   MRN: [id number] Spoke wife via telephone to discuss her concerns. She voiced the therapists has scared her to death about being on top on him when he gets up. Discussed the importance of him using his rolling walker when up and therapy team does want her with him when he is up moving. Discussed hiring assist and will give her a private duty list if she decides to hire assist. Aware he can not be left alone. Pt is quite stubborn and tends to do what he wants, thus the reason for being here three times with falls. Will see at lunch when here.

## 2018-04-03 NOTE — Discharge Summary (Signed)
Physician Discharge Summary  Patient ID: Ryan Savage MRN: [id number] DOB/AGE: Sep 28, 1929 84 y.o.  Admit date: 03/25/2018 Discharge date: 04/06/2018  Discharge Diagnoses:  Principal Problem:   Subdural hemorrhage ([place]) Active Problems:   Hypertension   Acute blood loss anemia   History of subdural hematoma   Chronic systolic CHF (congestive heart failure) ([place])   Insomnia   Discharged Condition:  Stable   Significant Diagnostic Studies: Dg Chest 2 View  Result Date: 03/26/2018 CLINICAL DATA:  Right-sided weakness.  Recurrent falls. EXAM: CHEST - 2 VIEW COMPARISON:  03/19/2018.  11/18/2017. FINDINGS: Cardiomegaly with mild bilateral interstitial prominence and bilateral pleural effusions consistent CHF. Interstitial prominence slightly more prominent than on prior. No pneumothorax. IMPRESSION: Findings consistent with congestive heart failure with bilateral pulmonary interstitial edema and small pleural effusions. Electronically Signed   By: Marcello Moores  Register   On: 03/26/2018 10:05     Labs:  Basic Metabolic Panel: BMP Latest Ref Rng & Units 04/01/2018 03/26/2018 03/20/2018  Glucose 65 - 99 mg/dL 88 109(H) 199(H)  BUN 6 - 20 mg/dL 12 10 12   Creatinine 0.61 - 1.24 mg/dL 0.72 0.66 0.74  Sodium 135 - 145 mmol/L 133(L) 129(L) 128(L)  Potassium 3.5 - 5.1 mmol/L 3.9 3.9 4.3  Chloride 101 - 111 mmol/L 96(L) 93(L) 94(L)  CO2 22 - 32 mmol/L 29 27 25   Calcium 8.9 - 10.3 mg/dL 8.6(L) 8.5(L) 8.0(L)    CBC: CBC Latest Ref Rng & Units 04/01/2018 03/26/2018 03/20/2018  WBC 4.0 - 10.5 K/uL 7.4 7.1 5.0  Hemoglobin 13.0 - 17.0 g/dL 11.4(L) 12.5(L) 12.5(L)  Hematocrit 39.0 - 52.0 % 33.5(L) 36.6(L) 36.9(L)  Platelets 150 - 400 K/uL 197 184 123(L)    CBG: No results for input(s): GLUCAP in the last 168 hours.  Brief HPI:   Ryan Savage is an 84 year old male with history of A fib, [name], chronic systolic CHF, hyponatremia, recurrent SDH with burr hole carni in March and April. He was  readmitted on 03/19/18  With right sided weakness and falls X 2 days. CT head showed reaccumulation of L-SDH with subfalcine herniation and he was taken to OR for left crani with evacuation of SDH by Dr. Ellene Route.  Follow up CT showed decrease in tension effect on left frontal pole and slight improvement in left subfalcine herniation. Patient was showing improvement neurologically but continued to have functional deficits per therapy evaluation. CIR was recommended for follow up therapy.    Hospital Course: Ryan Savage was admitted to rehab 03/25/2018 for inpatient therapies to consist of PT, ST and OT at least three hours five days a week. Past admission physiatrist, therapy team and rehab RN have worked together to provide customized collaborative inpatient rehab. Left craniotomy incision has been healing well and staples were removed without difficulty on 05/22. Incision is C/D/I without signs of infection. Po intake has been good and he is continent of bowel and bladder. Insomnia was managed initially with use of trazodone but he has had hangover effect with confusion in am therefore this was discontinued. Follow up CBC showed  ABLA and thrombocytopenia has resolved. Heart rate has been stable on low dose BB and respiratory status has been stable without signs of overload. Weights were monitored daily and is currently down to 160 lbs therefore diuretics not resumed.  Follow up CXR without changes. Check of lytes showed that hyponatremia is improving and renal status is stable.  He was started on low dose ritalin to help with attention and for activation.  He has progressed to close supervision with activity and continues to require mod assist with cognitive tasks. He will continue to receive follow up [place] [place] [facility] [place] by [facility].    Rehab course: During patient's stay in rehab weekly team conferences were held to monitor patient's progress, set goals and discuss barriers to discharge. At  admission, patient required Min to mod assist with basic ADL tasks and min assist with mobility.  He exhibited mild to moderate cognitive impairments affecting problem solving, recall and safety.  He  has had improvement in activity tolerance, balance, postural control as well as ability to compensate for deficits.  He is able to complete ADL tasks with close supervision. He requires close supervision for transfers and to ambulate 150' with RW. He needs min assist to navigate 12 stairs with one rail. He continues to exhibit moderate cognitive deficits with poor safety awareness. Family education was completed with wife regarding all aspects of safety, cognitive deficits as well as concerns about ability to provide supervision needed.     Disposition: Home  Diet: Heart healthy. Low salt.   Special Instructions: 1. Needs 24 hours supervision and SBA when ambulating.  2. Recheck CBC in a week to monitor for stability of H/H.     Discharge Instructions    Ambulatory referral to Physical Medicine Rehab   Complete by:  As directed    1-2 weeks transitional care appt     Allergies as of 04/04/2018      Reactions   Novocain [procaine] Rash      Medication List    STOP taking these medications   pantoprazole 40 MG tablet Commonly known as:  PROTONIX     TAKE these medications   acetaminophen 325 MG tablet Commonly known as:  TYLENOL Take 1-2 tablets (325-650 mg total) by mouth every 4 (four) hours as needed for mild pain.   atorvastatin 20 MG tablet Commonly known as:  LIPITOR Take 1 tablet (20 mg total) by mouth every evening.   digoxin 0.125 MG tablet Commonly known as:  LANOXIN Take 1 tablet (0.125 mg total) by mouth daily.   docusate sodium 100 MG capsule Commonly known as:  COLACE Take 1 capsule (100 mg total) by mouth 2 (two) times daily. What changed:    when to take this  reasons to take this   finasteride 5 MG tablet Commonly known as:  PROSCAR Take 1 tablet (5  mg total) by mouth daily.   fluticasone 50 MCG/ACT nasal spray Commonly known as:  FLONASE Place 1 spray into both nostrils daily.   gabapentin 300 MG capsule Commonly known as:  NEURONTIN Take 1 capsule (300 mg total) by mouth at bedtime.   levETIRAcetam 500 MG tablet Commonly known as:  KEPPRA Take 1 tablet (500 mg total) by mouth 2 (two) times daily.   methylphenidate 5 MG tablet Commonly known as:  RITALIN Take 1 tablet (5 mg total) by mouth 2 (two) times daily with breakfast and lunch.   metoprolol succinate 25 MG 24 hr tablet Commonly known as:  TOPROL-XL Take 1 tablet (25 mg total) by mouth daily.   multivitamin with minerals Tabs tablet Take 1 tablet by mouth daily.   ranitidine 150 MG tablet Commonly known as:  ZANTAC Take 150 mg by mouth 2 (two) times daily.   traZODone 50 MG tablet Commonly known as:  DESYREL Take 0.5-1 tablets (25-50 mg total) by mouth at bedtime as needed for sleep.  Follow-up Information    Lajean Manes, MD Follow up on 04/08/2018.   Specialty:  Internal Medicine Why:  Appointment @ 11:00 Am Contact information: [address] [place] [address] [phone]        Sueanne Margarita, MD .   Specialty:  Cardiology Contact information: [phone] [address] [place] [address] [phone]        Charlett Blake, MD Follow up.   Specialty:  Physical Medicine and Rehabilitation Why:  office will call you with follow up appointment Contact information: [address] [place] [address] [phone]        Kristeen Miss, MD. Call.   Specialty:  Neurosurgery Why:  for follow up appointment Contact information: [address] [place] [address] [phone]           Signed: Bary Leriche 04/06/2018, 3:33 PM

## 2018-04-03 NOTE — Progress Notes (Signed)
Occupational Therapy Session Note  Patient Details  Name: Ryan Savage MRN: [id number] Date of Birth: [birth year]  Today's Date: 04/03/2018 OT Individual Time: 5035-4656 OT Individual Time Calculation (min): 75 min    Short Term Goals: Week 1:  OT Short Term Goal 1 (Week 1): Pt will complete 3/3 toileting tasks with guarding assist OT Short Term Goal 2 (Week 1): Pt will complete two grooming tasks in standing position with CGA and no seated rest break in order to increase functional activity tolerance OT Short Term Goal 3 (Week 1): Pt will ambulate into bathroom to complete ADLs with CGA OT Short Term Goal 4 (Week 1): Pt will recall proper hand placement on RW during functional transfers consistently at mod I level  Skilled Therapeutic Interventions/Progress Updates:    Pt seen for OT ADL bathing/dressing session. Pt awake in supine upon arrival, ready for tx session. He transferred to EOB from flat bed in simulation of home environment with supervision. He ambulated with RW and close supervision throughout room. Completed toilet transfers and shower stall transfer with guarding assist and VCs for RW management during functional tasks. Throughout session, required VCs for hand palcement on RW and safety awareness during functional tasks, though is improving during time on rehab. He bathed seated on shower bench, required VCs for sequencing, problem solving and safety considerations. He returned to couch to dress with close supervision when standing to complete LB clothing management. Pt able to self cue to sit to don button down shirt and fasten pants. He completed grooming tasks standing at sink with supervision. Following seated rest break, he ambulated throughout unit to therapy day with emphasis on RW management and obstacle negotiation. Min cuing for safety awareness and to slow down ambulation speed.  Pt returned to room at end of session, left seated in recliner with QRB donned, bed alarm  on, and all needs in reach. RN present.   Therapy Documentation Precautions:  Precautions Precautions: Fall Restrictions Weight Bearing Restrictions: No Pain:   No/denies pain  See Function Navigator for Current Functional Status.   Therapy/Group: Individual Therapy  Dekota Shenk L 04/03/2018, 6:55 AM

## 2018-04-03 NOTE — Progress Notes (Signed)
Occupational Therapy Discharge Summary  Patient Details  Name: Ryan Savage MRN: [id number] Date of Birth: Aug 22, 1929   Patient has met 10 of 10 long term goals due to improved activity tolerance, improved balance, postural control, improved attention, improved awareness and improved coordination.  Patient to discharge at overall close Supervision level and using RW.  Patient's care partner is independent to provide the necessary physical and cognitive assistance at discharge.  Pt's wife has completed hands on family training for ADL tasks. She is aware of pt's need for within reach supervision assist for all mobility and his need for cuing for approrpaite RW management in functional contexts as well as his poor memory/ safety awareness and functional implications.   Recommendation:  Patient will benefit from ongoing skilled OT services in home health setting to continue to advance functional skills in the area of BADL and Reduce care partner burden.  Equipment: Pt already has BSC and shower chair from previous admission  Reasons for discharge: treatment goals met and discharge from hospital  Patient/family agrees with progress made and goals achieved: Yes  OT Discharge Precautions/Restrictions  Precautions Precautions: Fall Restrictions Weight Bearing Restrictions: No Vision Baseline Vision/History: Wears glasses Wears Glasses: At all times Patient Visual Report: No change from baseline Vision Assessment?: No apparent visual deficits Perception  Perception: Within Functional Limits Praxis Praxis: Intact Cognition Overall Cognitive Status: History of cognitive impairments - at baseline Arousal/Alertness: Awake/alert Orientation Level: Oriented X4 Selective Attention: Impaired Selective Attention Impairment: Functional complex;Verbal complex Memory: Impaired Memory Impairment: Decreased recall of new information;Storage deficit Decreased Short Term Memory: Verbal  basic;Functional basic Awareness: Impaired Awareness Impairment: Emergent impairment;Anticipatory impairment Problem Solving: Impaired Problem Solving Impairment: Verbal basic;Functional basic Executive Function: Organizing;Self Monitoring;Self Correcting;Decision Making;Reasoning Reasoning: Impaired Reasoning Impairment: Verbal basic;Functional basic Organizing: Impaired Organizing Impairment: Functional basic Decision Making: Impaired Decision Making Impairment: Verbal basic;Functional basic Self Monitoring: Impaired Self Monitoring Impairment: Verbal complex;Functional complex Self Correcting: Impaired Self Correcting Impairment: Verbal basic;Functional basic Behaviors: Impulsive;Restless Safety/Judgment: Impaired Comments: decreased awareness of cognitive deficits and high fall risk Rancho Los Amigos Scales of Cognitive Functioning: Automatic/appropriate Sensation Sensation Light Touch: Appears Intact Proprioception: Appears Intact Coordination Gross Motor Movements are Fluid and Coordinated: No Fine Motor Movements are Fluid and Coordinated: No Coordination and Movement Description: Generalized weakness Finger Nose Finger Test: Decreased speed and accuracy B UEs, difficulty following directions for task 2/2 cognitive deficits Motor  Motor Motor: Other (comment) Motor - Discharge Observations: R mild hemi, prior R SDH affecting L side, global weakness Trunk/Postural Assessment  Cervical Assessment Cervical Assessment: Exceptions to WFL(Forward head) Thoracic Assessment Thoracic Assessment: Exceptions to WFL(Kyphotic) Lumbar Assessment Lumbar Assessment: Exceptions to WFL(Posterior pelvic tilt) Postural Control Postural Control: Deficits on evaluation(Delayed righting reactions)  Balance Balance Balance Assessed: Yes Static Sitting Balance Static Sitting - Balance Support: Feet supported;No upper extremity supported Static Sitting - Level of Assistance: 6: Modified  independent (Device/Increase time) Dynamic Sitting Balance Dynamic Sitting - Balance Support: During functional activity;Feet supported Dynamic Sitting - Level of Assistance: 5: Stand by assistance Sitting balance - Comments: Sitting during bathing/dressing tasks Static Standing Balance Static Standing - Balance Support: During functional activity Static Standing - Level of Assistance: 5: Stand by assistance;4: Min assist Static Standing - Comment/# of Minutes: Standing to complete LB clothing management Dynamic Standing Balance Dynamic Standing - Balance Support: No upper extremity supported;During functional activity Dynamic Standing - Level of Assistance: 5: Stand by assistance;4: Min assist Dynamic Standing - Comments: Standing to complete LB clothing management Extremity/Trunk Assessment  RUE Assessment RUE Assessment: Exceptions to [facility] RUE AROM (degrees) Overall AROM Right Upper Extremity: Within functional limits for tasks performed RUE Strength RUE Overall Strength: Deficits(3+/5 throughout) LUE Assessment LUE Assessment: Within Functional Limits   See Function Navigator for Current Functional Status.  Darice Vicario L 04/03/2018, 12:31 PM

## 2018-04-03 NOTE — Progress Notes (Signed)
Physical Therapy Discharge Summary  Patient Details  Name: Ryan Savage MRN: [id number] Date of Birth: 12-13-1928  Today's Date: 04/03/2018 PT Individual Time: 5170-0174 PT Individual Time Calculation (min): 56 min    Patient has met 8 of 10 long term goals due to improved activity tolerance, improved balance, improved postural control, increased strength, ability to compensate for deficits, functional use of  right upper extremity and right lower extremity, improved attention, improved awareness and improved coordination.  Patient to discharge at an ambulatory level close supervision/min assist with RW.   Patient's wife Enid Derry) has participated in hands on caregiver training, but this therapist does not anticipate she will be able to provide the necessary assistance upon d/c & LCSW made aware a few days ago.  Reasons goals not met: impaired cognition  Recommendation:  Patient will benefit from ongoing skilled PT services in home health setting to continue to advance safe functional mobility, address ongoing impairments in impaired cognition/memory/recall, transfers, gait, balance, NMR, gait, stair negotiation, safety awareness, and minimize fall risk.  Equipment: none - pt already has RW  Reasons for discharge: treatment goals met  Patient/family agrees with progress made and goals achieved: Yes  Skilled PT Treatment: Pt received in room & agreeable to tx. No c/o pain reported. Pt reports need to use restroom and has continent void standing by toilet with close supervision. Pt completes all grad day activities with supervision<>min assist overall with RW, please see below for details. Pt continues to demonstrate decreased safety awareness with RW and frequently bumps the right side of it with RLE with max cuing to constantly ambulate within base of RW. Pt also requires max cuing for sequencing and to allow enough space on each step for BLE when negotiating stairs. Pt also requires max  cuing to face the rail he is holding when laterally negotiating stairs. Pt requires cuing for sequencing of RW when negotiating curb. Pt very internally distracted throughout session and demonstrates continued impaired attention, safety awareness, recall, problem solving. At end of session pt left sitting in recliner with quick release belt & chair alarm donned, all needs within reach. No family present for education.  PT Discharge Precautions/Restrictions Precautions Precautions: Fall Restrictions Weight Bearing Restrictions: No  Vision/Perception  Wears glasses at all times at baseline. Pt reports seeing "streaks" for a few seconds when he wakes up.Perception Perception: Impaired Inattention/Neglect: (decreased attention to R visual field)  Cognition Overall Cognitive Status: History of cognitive impairments - at baseline Arousal/Alertness: Awake/alert Orientation Level: Oriented X4 Attention: Sustained;Selective Sustained Attention: Impaired Selective Attention: Impaired Memory: Impaired Memory Impairment: Decreased long term memory;Decreased short term memory;Decreased recall of new information Decreased Short Term Memory: Verbal basic;Functional basic Awareness: Impaired Awareness Impairment: Emergent impairment;Anticipatory impairment Problem Solving: Impaired Problem Solving Impairment: Functional basic;Verbal basic Executive Function: Organizing;Self Monitoring;Self Correcting;Decision Making;Reasoning Reasoning: Impaired Sequencing: Impaired Organizing: Impaired Decision Making: Impaired Self Monitoring: Impaired Self Correcting: Impaired Behaviors: Impulsive Safety/Judgment: Impaired Comments: decreased awareness of cognitive deficits & high fall risk  Sensation Sensation Light Touch: Appears Intact(BLE) Additional Comments: pt reports numbness in BLE has ceased Coordination Gross Motor Movements are Fluid and Coordinated: No Fine Motor Movements are Fluid and  Coordinated: No  Motor  Motor Motor: Hemiplegia(mild) Motor - Skilled Clinical Observations: Generalized weakness and deconditioning Motor - Discharge Observations: R mild hemi, prior R SDH affecting L side, global weakness   Mobility Bed Mobility Bed Mobility: Rolling Right;Rolling Left;Supine to Sit;Sit to Supine Rolling Right: 5: Supervision Rolling Left: 5: Supervision Supine to Sit:  5: Supervision Sit to Supine: 5: Supervision Transfers Transfers: Yes Sit to Stand: 5: Supervision;With upper extremity assist(verbal cuing for hand placement 2/2 pts impaired memory/recall) Sit to Stand Details: Verbal cues for technique;Verbal cues for sequencing;Verbal cues for precautions/safety Stand to Sit: 5: Supervision;With upper extremity assist Stand to Sit Details (indicate cue type and reason): Verbal cues for precautions/safety;Verbal cues for sequencing;Verbal cues for technique Stand Pivot Transfers: 5: Supervision(close supervision with RW) Stand Pivot Transfer Details: Verbal cues for technique;Verbal cues for sequencing;Verbal cues for precautions/safety   Locomotion  Ambulation Ambulation: Yes Ambulation/Gait Assistance: 5: Supervision Ambulation Distance (Feet): 150 Feet Assistive device: Rolling walker Ambulation/Gait Assistance Details: Verbal cues for technique;Verbal cues for precautions/safety Ambulation/Gait Assistance Details: max cuing to ambulate within base of RW as pt frequently stepping on outside of R side RW Gait Gait: No Gait Pattern: (minimal<>absent heel strike BLE, decreased step & stride length BLE, decreased gait speed, shuffled gait) Stairs / Additional Locomotion Stairs: Yes Stairs Assistance: 4: Min assist Stairs Assistance Details: Verbal cues for sequencing;Verbal cues for technique;Verbal cues for precautions/safety Stair Management Technique: (1 rail) Number of Stairs: 12 Height of Stairs: 6(inches) Ramp: 4: Min assist(ambulatory with  RW) Curb: 4: Min assist(ambulatory with RW) Wheelchair Mobility Wheelchair Mobility: No   Trunk/Postural Assessment  Cervical Assessment Cervical Assessment: (forward head) Postural Control Postural Control: Deficits on evaluation(delayed protective responses & righting reactions)   Balance Balance Balance Assessed: Yes Dynamic Standing Balance Dynamic Standing - Balance Support: Bilateral upper extremity supported;During functional activity Dynamic Standing - Level of Assistance: 5: Stand by assistance Dynamic Standing - Balance Activities: (during gait)   Extremity Assessment  LLE: 4/5 knee flexion & extension, 4-/5 ankle dorsiflexion, 3+/5 hip flexion RLLE: 4-/5 knee extension, 4/5 knee flexion, 3+/5 ankle dorsiflexion, 3+/5 hip flexion  See Function Navigator for Current Functional Status.  [name] 04/03/2018, 4:33 PM

## 2018-04-03 NOTE — Progress Notes (Signed)
Speech Language Pathology Discharge Summary  Patient Details  Name: Ryan Savage MRN: [id number] Date of Birth: 13-Nov-1928  Today's Date: 04/03/2018 SLP Individual Time: 0730-0800 SLP Individual Time Calculation (min): 30 min   Skilled Therapeutic Interventions:  Skilled treatment session focused on reorienting pt to current situation as he was just waking up for the morning. Pt with moderate confusion and moderate difficulty comprehending SLP instructions. Pt expecting to eat breakfast when he first awakes and was difficult to redirect. Therefore SLP set-up breakfast and pt settled down and began eating.       Patient has met 3 of 3 long term goals.  Patient to discharge at overall Mod level.    Clinical Impression/Discharge Summary:   Pt has made gradual progress in ST sessions but continues to require active 24 hour supervision d/t moderate cognitive deficits, decreased insight/awareness and high fall risk. Education has been provided to pt's wife and recommendation for her to hire caregivers were recommended. HHST would be helpful to aid wife and pt on making home environment safe and helping family create routine.   Care Partner:  Caregiver Able to Provide Assistance: Yes  Type of Caregiver Assistance: Physical;Cognitive  Recommendation:  Home Health SLP;24 hour supervision/assistance  Rationale for SLP Follow Up: Maximize cognitive function and independence;Reduce caregiver burden   Equipment:     Reasons for discharge: Discharged from hospital   Patient/Family Agrees with Progress Made and Goals Achieved: Yes   Function:  Eating Eating   Modified Consistency Diet: No Eating Assist Level: No help, No cues           Cognition Comprehension Comprehension assist level: Follows basic conversation/direction with extra time/assistive device  Expression   Expression assist level: Expresses basic 90% of the time/requires cueing < 10% of the time.  Social Interaction  Social Interaction assist level: Interacts appropriately with others with medication or extra time (anti-anxiety, antidepressant).  Problem Solving Problem solving assist level: Solves basic 50 - 74% of the time/requires cueing 25 - 49% of the time;Solves basic 75 - 89% of the time/requires cueing 10 - 24% of the time  Memory Memory assist level: Recognizes or recalls 50 - 74% of the time/requires cueing 25 - 49% of the time   Mekenzie Modeste 04/03/2018, 11:26 AM

## 2018-04-03 NOTE — Progress Notes (Signed)
Social Work  Discharge Note  The overall goal for the admission was met for: DC-SAT 5/25  Discharge location: Yes-HOME WITH WIFE WHO CAN PROVIDE 24 HR SUPERVISION LEVEL  Length of Stay: Yes-10 DAYS  Discharge activity level: Yes-SUPERVISION LEVEL  Home/community participation: Yes  Services provided included: MD, RD, PT, OT, SLP, RN, CM, Pharmacy and SW  Financial Services: Private Insurance: [facility]  Follow-up services arranged: Home Health: [facility]-PT,OT,SP and Patient/Family request agency HH: [facility], DME: NO NEEDS  Comments (or additional information):WIFE AWARE AND WILL DO HER BEST TO BE THERE WHEN PT IS UP MOVING. GAVE LIST OF PRIVATE DUTY AGENCY FOR HIRING ASSIST. WIFE AWARE OF PT'S STUBBORNNESS AND TENDENCY TO DO WHAT HE WANTS TO DO WITH OR WITHOUT THE WALKER.   Patient/Family verbalized understanding of follow-up arrangements: Yes  Individual responsible for coordination of the follow-up plan: SHIRLEY-WIFE  Confirmed correct DME delivered: Elease Hashimoto 04/03/2018    Elease Hashimoto

## 2018-04-03 NOTE — Progress Notes (Signed)
Occupational Therapy Note  Patient Details  Name: Ryan Savage MRN: [id number] Date of Birth: May 24, 1929  Today's Date: 04/03/2018 OT Individual Time: 1130-1200 OT Individual Time Calculation (min): 30 min   No c/o pain   Pt seen this session to address a HEP as pt will be going home tomorrow.  Pt stated that he wanted exercises specifically for his hamstrings as he felt this was his weakest area. Pt ambulated to gym with RW with close S and cues to avoid veering walker to his R.  Pt worked on squats, hamstring curls with Level 2 band, and scapular extension with Level 2 band.  Pt was quite talkative so it took some time to get through the exercises. Time did not allow for practice with bridges in supine, but demonstrated to pt.  Provided pt with HEP sheet of the 4 exercises. Pt ambulated back to room and sat in recliner to prepare for lunch.  Chair alarm and quick release belt on.    [name] 04/03/2018, 12:16 PM

## 2018-04-03 NOTE — Progress Notes (Signed)
Subjective/Complaints:  No issues overnite, oriented x 3  ROS: Patient denies fever, rash, sore throat, blurred vision, nausea, vomiting, diarrhea, cough, shortness of breath or chest pain, joint or back pain, headache, or mood change.    Objective: Vital Signs: Blood pressure 111/79, pulse 78, temperature 98 F (36.7 C), temperature source Oral, resp. rate 16, height '5\' 10"'$  (1.778 m), weight 79 kg (174 lb 2.6 oz), SpO2 95 %. No results found. Results for orders placed or performed during the hospital encounter of 03/25/18 (from the past 72 hour(s))  Basic metabolic panel     Status: Abnormal   Collection Time: 04/01/18  5:36 AM  Result Value Ref Range   Sodium 133 (L) 135 - 145 mmol/L   Potassium 3.9 3.5 - 5.1 mmol/L   Chloride 96 (L) 101 - 111 mmol/L   CO2 29 22 - 32 mmol/L   Glucose, Bld 88 65 - 99 mg/dL   BUN 12 6 - 20 mg/dL   Creatinine, Ser 0.72 0.61 - 1.24 mg/dL   Calcium 8.6 (L) 8.9 - 10.3 mg/dL   GFR calc non Af Amer >60 >60 mL/min   GFR calc Af Amer >60 >60 mL/min    Comment: (NOTE) The eGFR has been calculated using the CKD EPI equation. This calculation has not been validated in all clinical situations. eGFR's persistently <60 mL/min signify possible Chronic Kidney Disease.    Anion gap 8 5 - 15    Comment: Performed at [facility] [address], [place], [place] [address]  CBC     Status: Abnormal   Collection Time: 04/01/18  5:36 AM  Result Value Ref Range   WBC 7.4 4.0 - 10.5 K/uL   RBC 3.69 (L) 4.22 - 5.81 MIL/uL   Hemoglobin 11.4 (L) 13.0 - 17.0 g/dL   HCT 33.5 (L) 39.0 - 52.0 %   MCV 90.8 78.0 - 100.0 fL   MCH 30.9 26.0 - 34.0 pg   MCHC 34.0 30.0 - 36.0 g/dL   RDW 13.3 11.5 - 15.5 %   Platelets 197 150 - 400 K/uL    Comment: Performed at [facility], [place] [address] [place], [place] [address]     Constitutional: No distress . Vital signs reviewed. HEENT: EOMI, oral membranes moist Neck: supple Cardiovascular: RRR without murmur.  No JVD    Respiratory: CTA Bilaterally without wheezes or rales. Normal effort    GI: BS +, non-tender, non-distended  Extremity:  Pulses positive and No Edema Skin:   Intact Neuro: Alert/Oriented to self and place.  and Abnormal Motor 4/5 RIght delt, Bi, Tri, Grip , HF, KE ADF Musc/Skel:  Other no pain with UE or LE ROM     Assessment/Plan: 1. Functional deficits secondary to recurrent Left SDH with Right hemiparesis which require 3+ hours per day of interdisciplinary therapy in a comprehensive inpatient rehab setting. Physiatrist is providing close team supervision and 24 hour management of active medical problems listed below. Physiatrist and rehab team continue to assess barriers to discharge/monitor patient progress toward functional and medical goals. FIM: Function - Bathing Position: Shower Body parts bathed by patient: Right arm, Left arm, Abdomen, Front perineal area, Right upper leg, Left upper leg, Buttocks, Chest, Right lower leg, Left lower leg Body parts bathed by helper: Back Assist Level: Supervision or verbal cues  Function- Upper Body Dressing/Undressing What is the patient wearing?: Pull over shirt/dress Pull over shirt/dress - Perfomed by patient: Thread/unthread right sleeve, Thread/unthread left sleeve, Pull  shirt over trunk, Put head through opening Pull over shirt/dress - Perfomed by helper: Put head through opening Button up shirt - Perfomed by patient: Thread/unthread right sleeve, Pull shirt around back, Button/unbutton shirt, Thread/unthread left sleeve Button up shirt - Perfomed by helper: Thread/unthread left sleeve Assist Level: Supervision or verbal cues Function - Lower Body Dressing/Undressing What is the patient wearing?: Pants, Underwear, Socks, Shoes Position: Wheelchair/chair at [employer] - Performed by patient: Thread/unthread left underwear leg, Pull underwear up/down, Thread/unthread right underwear leg Underwear - Performed by helper:  Thread/unthread right underwear leg Pants- Performed by patient: Thread/unthread left pants leg, Pull pants up/down, Fasten/unfasten pants, Thread/unthread right pants leg Pants- Performed by helper: Thread/unthread right pants leg Non-skid slipper socks- Performed by patient: Don/doff right sock, Don/doff left sock Socks - Performed by patient: Don/doff right sock, Don/doff left sock Socks - Performed by helper: Don/doff left sock(A with using sock aid ) Shoes - Performed by patient: Don/doff right shoe, Don/doff left shoe Shoes - Performed by helper: Fasten right, Fasten left Assist for footwear: Supervision/touching assist Assist for lower body dressing: Supervision or verbal cues  Function - Toileting Toileting steps completed by patient: Adjust clothing prior to toileting, Performs perineal hygiene, Adjust clothing after toileting Toileting steps completed by helper: Performs perineal hygiene, Adjust clothing after toileting Toileting Assistive Devices: Grab bar or rail Assist level: More than reasonable time  Function - Toilet Transfers Toilet transfer assistive device: Grab bar, Elevated toilet seat/BSC over toilet Assist level to toilet: Supervision or verbal cues Assist level from toilet: Supervision or verbal cues  Function - Chair/bed transfer Chair/bed transfer method: Stand pivot Chair/bed transfer assist level: Supervision or verbal cues Chair/bed transfer assistive device: Walker Chair/bed transfer details: Verbal cues for safe use of DME/AE  Function - Locomotion: Wheelchair Will patient use wheelchair at discharge?: No Type: Manual Wheelchair activity did not occur: Safety/medical concerns Max wheelchair distance: 100 Assist Level: Supervision or verbal cues Wheel 50 feet with 2 turns activity did not occur: Safety/medical concerns Assist Level: Supervision or verbal cues Wheel 150 feet activity did not occur: Safety/medical concerns Assist Level: Supervision or  verbal cues Turns around,maneuvers to table,bed, and toilet,negotiates 3% grade,maneuvers on rugs and over doorsills: No Function - Locomotion: Ambulation Assistive device: Walker-rolling Max distance: 70 Assist level: Supervision or verbal cues Assist level: Supervision or verbal cues Walk 50 feet with 2 turns activity did not occur: Safety/medical concerns Assist level: Supervision or verbal cues Walk 150 feet activity did not occur: Safety/medical concerns Assist level: Supervision or verbal cues Walk 10 feet on uneven surfaces activity did not occur: Safety/medical concerns  Function - Comprehension Comprehension: Auditory Comprehension assist level: Understands complex 90% of the time/cues 10% of the time  Function - Expression Expression: Verbal Expression assist level: Expresses basic needs/ideas: With extra time/assistive device  Function - Social Interaction Social Interaction assist level: Interacts appropriately with others with medication or extra time (anti-anxiety, antidepressant).  Function - Problem Solving Problem solving assist level: Solves basic 50 - 74% of the time/requires cueing 25 - 49% of the time, Solves basic 75 - 89% of the time/requires cueing 10 - 24% of the time  Function - Memory Memory assist level: Recognizes or recalls 75 - 89% of the time/requires cueing 10 - 24% of the time Patient normally able to recall (first 3 days only): Current season, Location of own room, That he or she is in a hospital, Staff names and faces   Medical Problem List and Plan:  1.  Deficits with mobility, self-care, and endurance secondary to recurrent left SDH.   -continue CIR PT, OT, SLP plan D/C in am  2.  DVT Prophylaxis/Anticoagulation: Mechanical: Sequential compression devices, below knee Bilateral lower extremities 3. Pain Management: On Neurontin for neuropathic symptoms.  4. Mood: Team to provide ego support. LCSW to follow for evaluation and support.  5.  Neuropsych: This patient is not fully  capable of making decisions on his  own behalf. 6. Skin/Wound Care: Monitor wound for healing. Routine pressure relief measures.  7. Fluids/Electrolytes/Nutrition: Strict I/O. Maintain adequate nutritional support.  - chronic SIADH   -recheck 133 on 5/22 8. PAF:  Off anticoagulation due to recurrent bleeds. Rate controlled on metoprolol and lanoxin.  9. [name]/Chronic systolic CHF: Strict I/O. Monitor weights daily. On metoprolol  CXR similar to prior film ~1 wk ago  -stable Filed Weights   04/01/18 0549 04/02/18 0500 04/03/18 0543  Weight: 77.8 kg (171 lb 8.3 oz) 78.5 kg (173 lb 1 oz) 79 kg (174 lb 2.6 oz)    10. HTN: Monitor BP bid. Continue metoprolol bid.  Vitals:   04/02/18 2033 04/03/18 0543  BP: 121/87 111/79  Pulse: 74 78  Resp: 17 16  Temp: 98.3 F (36.8 C) 98 F (36.7 C)  SpO2: 98% 95%  Controlled 5/24 11. Seizure prophylaxis: NS recommended  Keppra bid for 2-3 months. Does not appear overly sedated  12. Insomnia: scheduled trazodone early evening has helped sleep.  Will need to monitor whether this causes any mental status changes or orthostasis                                                      13. OSA: CPAP not effective and was schedule to have sleep study.   oxygen for use at night prn.  14.  Hypoalb- prostat 15.  Poor attention , forgetfulness, trail Ritalin- touch base with therapy, SLP to determine effect and whether pt needs Rx for home use  LOS (Days) 9 A FACE TO FACE EVALUATION WAS PERFORMED  Charlett Blake 04/03/2018, 7:46 AM

## 2018-04-03 NOTE — Discharge Instructions (Signed)
Inpatient Rehab Discharge Instructions  Ryan Savage Discharge date and time: 04/04/18   Activities/Precautions/ Functional Status: Activity: no lifting, driving, or strenuous exercise till cleared by MD Diet: cardiac diet Wound Care: keep wound clean and dry   Functional status:  ___ No restrictions     ___ Walk up steps independently _X__ 24/7 supervision/assistance   ___ Walk up steps with assistance ___ Intermittent supervision/assistance  ___ Bathe/dress independently ___ Walk with walker     _X__ Bathe/dress with assistance ___ Walk Independently    ___ Shower independently _X__ Walk with assistance    ___ Shower with assistance __X_ No alcohol     ___ Return to work/school ________  [name] Instructions: 1. May need to limit walking and use wheelchair as mode of transportation--you have to keep your hands on him when he's walking.  COMMUNITY REFERRALS UPON DISCHARGE:    Home Health:   PT, OT, [place]   Date of last service:  Medical Equipment/Items Ordered:HAS FROM PREVIOUS ADMITS   GENERAL COMMUNITY RESOURCES FOR PATIENT/FAMILY: Support Groups:[facility] SUPPORT GROUP EVERY SECOND Tuesday @ 7:00-8:30 PM ON THE REHAB UNIT QUESTIONS CONTACT LUCY [phone]   My questions have been answered and I understand these instructions. I will adhere to these goals and the provided educational materials after my discharge from the hospital.  Patient/Caregiver Signature _______________________________ Date __________  Clinician Signature _______________________________________ Date __________  Please bring this form and your medication list with you to all your follow-up doctor's appointments.

## 2018-04-04 NOTE — Progress Notes (Signed)
Patient and spouse received discharge instructions from Algis Liming, PA-C with verbal understanding 04/03/18. Patient questioned about sleep medication and stated that they make him too drowsy the next morning. This RN mentioned that he could try 5mg  to 10mg  Melatonin to help with adjusting his sleep/wake cycle. This could be purchased at the drug store and it would not need a prescription. They were more than welcome to speak to their pharmacist about this medication. Patient's son arrived to take patient and spouse home along with patient's belongings.

## 2018-04-04 NOTE — Progress Notes (Signed)
Subjective/Complaints:  No issues overnite, aware of D/C  ROS: no CP, SOB, N/V/D Objective: Vital Signs: Blood pressure 110/84, pulse 85, temperature 97.6 F (36.4 C), temperature source Oral, resp. rate 18, height 5\' 10"  (1.778 m), weight 72.6 kg (160 lb 0.9 oz), SpO2 97 %. No results found. No results found for this or any previous visit (from the past 72 hour(s)).   Constitutional: No distress . Vital signs reviewed. HEENT: EOMI, oral membranes moist Neck: supple Cardiovascular: RRR without murmur. No JVD    Respiratory: CTA Bilaterally without wheezes or rales. Normal effort    GI: BS +, non-tender, non-distended  Extremity:  Pulses positive and No Edema Skin:   Intact Neuro: Alert/Oriented to self and place.  and Abnormal Motor 4/5 RIght delt, Bi, Tri, Grip , HF, KE ADF Musc/Skel:  Other no pain with UE or LE ROM     Assessment/Plan: 1. Functional deficits secondary to recurrent Left SDH with Right hemiparesis which require 3+ hours per day of interdisciplinary therapy in a comprehensive inpatient rehab setting. Physiatrist is providing close team supervision and 24 hour management of active medical problems listed below. Physiatrist and rehab team continue to assess barriers to discharge/monitor patient progress toward functional and medical goals. FIM: Function - Bathing Position: Shower Body parts bathed by patient: Right arm, Left arm, Abdomen, Front perineal area, Right upper leg, Left upper leg, Buttocks, Chest, Right lower leg, Left lower leg, Back Body parts bathed by helper: Back Assist Level: Supervision or verbal cues  Function- Upper Body Dressing/Undressing What is the patient wearing?: Pull over shirt/dress Pull over shirt/dress - Perfomed by patient: Thread/unthread right sleeve, Thread/unthread left sleeve, Pull shirt over trunk, Put head through opening Pull over shirt/dress - Perfomed by helper: Put head through opening Button up shirt - Perfomed by  patient: Thread/unthread right sleeve, Pull shirt around back, Button/unbutton shirt, Thread/unthread left sleeve Button up shirt - Perfomed by helper: Thread/unthread left sleeve Assist Level: Supervision or verbal cues Function - Lower Body Dressing/Undressing What is the patient wearing?: Pants, Underwear, Socks, Shoes Position: Education officer, museum at [employer] - Performed by patient: Thread/unthread left underwear leg, Pull underwear up/down, Thread/unthread right underwear leg Underwear - Performed by helper: Thread/unthread right underwear leg Pants- Performed by patient: Thread/unthread left pants leg, Pull pants up/down, Fasten/unfasten pants, Thread/unthread right pants leg Pants- Performed by helper: Thread/unthread right pants leg Non-skid slipper socks- Performed by patient: Don/doff right sock, Don/doff left sock Socks - Performed by patient: Don/doff right sock, Don/doff left sock Socks - Performed by helper: Don/doff left sock(A with using sock aid ) Shoes - Performed by patient: Don/doff right shoe, Don/doff left shoe, Fasten right, Fasten left Shoes - Performed by helper: Fasten right, Fasten left Assist for footwear: Supervision/touching assist Assist for lower body dressing: Supervision or verbal cues  Function - Toileting Toileting steps completed by patient: Adjust clothing prior to toileting, Performs perineal hygiene, Adjust clothing after toileting Toileting steps completed by helper: Performs perineal hygiene, Adjust clothing after toileting Toileting Assistive Devices: Grab bar or rail Assist level: Supervision or verbal cues  Function - Toilet Transfers Toilet transfer assistive device: Grab bar, Elevated toilet seat/BSC over toilet, Walker Assist level to toilet: Supervision or verbal cues Assist level from toilet: Supervision or verbal cues  Function - Chair/bed transfer Chair/bed transfer method: Ambulatory Chair/bed transfer assist level: Supervision or  verbal cues Chair/bed transfer assistive device: Walker Chair/bed transfer details: Verbal cues for precautions/safety  Function - Locomotion: Wheelchair Will patient use wheelchair  at discharge?: No Type: Manual Wheelchair activity did not occur: Safety/medical concerns Max wheelchair distance: 100 Assist Level: Supervision or verbal cues Wheel 50 feet with 2 turns activity did not occur: Safety/medical concerns Assist Level: Supervision or verbal cues Wheel 150 feet activity did not occur: Safety/medical concerns Assist Level: Supervision or verbal cues Turns around,maneuvers to table,bed, and toilet,negotiates 3% grade,maneuvers on rugs and over doorsills: No Function - Locomotion: Ambulation Assistive device: Walker-rolling Max distance: 150 ft  Assist level: Supervision or verbal cues Assist level: Supervision or verbal cues Walk 50 feet with 2 turns activity did not occur: Safety/medical concerns Assist level: Supervision or verbal cues Walk 150 feet activity did not occur: Safety/medical concerns Assist level: Supervision or verbal cues Walk 10 feet on uneven surfaces activity did not occur: Safety/medical concerns Assist level: Touching or steadying assistance (Pt > 75%)  Function - Comprehension Comprehension: Auditory Comprehension assist level: Follows basic conversation/direction with extra time/assistive device  Function - Expression Expression: Verbal Expression assist level: Expresses basic 90% of the time/requires cueing < 10% of the time.  Function - Social Interaction Social Interaction assist level: Interacts appropriately with others with medication or extra time (anti-anxiety, antidepressant).  Function - Problem Solving Problem solving assist level: Solves basic 50 - 74% of the time/requires cueing 25 - 49% of the time, Solves basic 75 - 89% of the time/requires cueing 10 - 24% of the time  Function - Memory Memory assist level: Recognizes or recalls 50  - 74% of the time/requires cueing 25 - 49% of the time Patient normally able to recall (first 3 days only): Current season, Location of own room, That he or she is in a hospital, Staff names and faces   Medical Problem List and Plan: 1.  Deficits with mobility, self-care, and endurance secondary to recurrent left SDH.   -continue CIR PT, OT, SLP plan D/C today  2.  DVT Prophylaxis/Anticoagulation: Mechanical: Sequential compression devices, below knee Bilateral lower extremities 3. Pain Management: On Neurontin for neuropathic symptoms.  4. Mood: Team to provide ego support. LCSW to follow for evaluation and support.  5. Neuropsych: This patient is not fully  capable of making decisions on his  own behalf. 6. Skin/Wound Care: Monitor wound for healing. Routine pressure relief measures.  7. Fluids/Electrolytes/Nutrition: Strict I/O. Maintain adequate nutritional support.  - chronic SIADH   -recheck 133 on 5/22 8. PAF:  Off anticoagulation due to recurrent bleeds. Rate controlled on metoprolol and lanoxin.  9. [name]/Chronic systolic CHF: Strict I/O. Monitor weights daily. On metoprolol  CXR similar to prior film ~1 wk ago  -stable Filed Weights   04/02/18 0500 04/03/18 0543 04/04/18 0453  Weight: 78.5 kg (173 lb 1 oz) 79 kg (174 lb 2.6 oz) 72.6 kg (160 lb 0.9 oz)    10. HTN: Monitor BP bid. Continue metoprolol bid.  Vitals:   04/04/18 0216 04/04/18 0449  BP: 123/85 110/84  Pulse: 81 85  Resp: 18   Temp:  97.6 F (36.4 C)  SpO2: 97% 97%  Controlled 5/25 11. Seizure prophylaxis: NS recommended  Keppra bid for 2-3 months. Does not appear overly sedated  12. Insomnia: scheduled trazodone early evening has helped sleep.  Will need to monitor whether this causes any mental status changes or orthostasis  13. OSA: CPAP not effective and was schedule to have sleep study.   oxygen for use at night prn.  14.  Hypoalb- prostat 15.  Poor  attention , forgetfulness, trail Ritalin- touch base with therapy, SLP to determine effect and whether pt needs Rx for home use  LOS (Days) 10 A FACE TO FACE EVALUATION WAS PERFORMED  Charlett Blake 04/04/2018, 8:39 AM

## 2018-04-09 ENCOUNTER — Other Ambulatory Visit: Payer: Self-pay | Admitting: Neurological Surgery

## 2018-04-09 ENCOUNTER — Ambulatory Visit
Admission: RE | Admit: 2018-04-09 | Discharge: 2018-04-09 | Disposition: A | Discharge status: Home / Self Care | Payer: Medicare Other | Source: Ambulatory Visit | Attending: Neurological Surgery | Admitting: Neurological Surgery

## 2018-04-09 DIAGNOSIS — S065XAA Traumatic subdural hemorrhage with loss of consciousness status unknown, initial encounter: Secondary | ICD-10-CM

## 2018-04-09 DIAGNOSIS — S065X9A Traumatic subdural hemorrhage with loss of consciousness of unspecified duration, initial encounter: Secondary | ICD-10-CM

## 2018-04-14 ENCOUNTER — Other Ambulatory Visit: Payer: Self-pay | Admitting: Physical Medicine and Rehabilitation

## 2018-04-16 ENCOUNTER — Telehealth: Payer: Self-pay | Admitting: *Deleted

## 2018-04-16 NOTE — Telephone Encounter (Signed)
[facility] called for 1wk2 visits to work on cognition.  Approval given.

## 2018-04-23 ENCOUNTER — Ambulatory Visit (HOSPITAL_BASED_OUTPATIENT_CLINIC_OR_DEPARTMENT_OTHER): Payer: Medicare Other | Admitting: Physical Medicine & Rehabilitation

## 2018-04-23 ENCOUNTER — Encounter: Payer: Medicare Other | Attending: Registered Nurse

## 2018-04-23 ENCOUNTER — Encounter: Payer: Self-pay | Admitting: Physical Medicine & Rehabilitation

## 2018-04-23 VITALS — BP 126/75 | HR 78 | Resp 14 | Ht 70.0 in | Wt 171.0 lb

## 2018-04-23 DIAGNOSIS — I5022 Chronic systolic (congestive) heart failure: Secondary | ICD-10-CM | POA: Diagnosis not present

## 2018-04-23 DIAGNOSIS — I341 Nonrheumatic mitral (valve) prolapse: Secondary | ICD-10-CM | POA: Diagnosis not present

## 2018-04-23 DIAGNOSIS — I482 Chronic atrial fibrillation: Secondary | ICD-10-CM | POA: Diagnosis not present

## 2018-04-23 DIAGNOSIS — N400 Enlarged prostate without lower urinary tract symptoms: Secondary | ICD-10-CM | POA: Diagnosis not present

## 2018-04-23 DIAGNOSIS — K219 Gastro-esophageal reflux disease without esophagitis: Secondary | ICD-10-CM | POA: Insufficient documentation

## 2018-04-23 DIAGNOSIS — G4700 Insomnia, unspecified: Secondary | ICD-10-CM | POA: Insufficient documentation

## 2018-04-23 DIAGNOSIS — Z8673 Personal history of transient ischemic attack (TIA), and cerebral infarction without residual deficits: Secondary | ICD-10-CM | POA: Diagnosis not present

## 2018-04-23 DIAGNOSIS — E785 Hyperlipidemia, unspecified: Secondary | ICD-10-CM | POA: Insufficient documentation

## 2018-04-23 DIAGNOSIS — I2510 Atherosclerotic heart disease of native coronary artery without angina pectoris: Secondary | ICD-10-CM | POA: Insufficient documentation

## 2018-04-23 DIAGNOSIS — S065XAA Traumatic subdural hemorrhage with loss of consciousness status unknown, initial encounter: Secondary | ICD-10-CM

## 2018-04-23 DIAGNOSIS — E871 Hypo-osmolality and hyponatremia: Secondary | ICD-10-CM | POA: Insufficient documentation

## 2018-04-23 DIAGNOSIS — I110 Hypertensive heart disease with heart failure: Secondary | ICD-10-CM | POA: Insufficient documentation

## 2018-04-23 DIAGNOSIS — G4733 Obstructive sleep apnea (adult) (pediatric): Secondary | ICD-10-CM | POA: Diagnosis not present

## 2018-04-23 DIAGNOSIS — S065X9A Traumatic subdural hemorrhage with loss of consciousness of unspecified duration, initial encounter: Secondary | ICD-10-CM

## 2018-04-23 DIAGNOSIS — I6200 Nontraumatic subdural hemorrhage, unspecified: Secondary | ICD-10-CM | POA: Insufficient documentation

## 2018-04-23 NOTE — Progress Notes (Signed)
Subjective:    Patient ID: Ryan Savage, male    DOB: 1929-04-26, 84 y.o.   MRN: [id number] 84 year old male with history of A fib, [name], chronic systolic CHF, hyponatremia, recurrent SDH with burr hole carni in March and April. He was readmitted on 03/19/18  With right sided weakness and falls X 2 days. CT head showed reaccumulation of L-SDH with subfalcine herniation and he was taken to OR for left crani with evacuation of SDH by Dr. Ellene Route.  Follow up CT showed decrease in tension effect on left frontal pole and slight improvement in left subfalcine herniation. Patient was showing improvement neurologically but continued to have functional deficits per therapy evaluation. CIR was recommended    HPI  Patient living at home with his wife.  Receiving home health therapy although he does not know how much longer they will be coming out. Patient is accompanied by his wife and his son.  He no longer drives. No falls  Uses rollator for walks in park with  Carefully goes up and down steps with assistance  Has follow up with [facility] in July, another  Pain Inventory Average Pain 0 Pain Right Now 0 My pain is no pain  In the last 24 hours, has pain interfered with the following? General activity 0 Relation with others 0 Enjoyment of life 0 What TIME of day is your pain at its worst? no pain Sleep (in general) Fair  Pain is worse with: no pain Pain improves with: no pain Relief from Meds: no pain  Mobility walk without assistance how many minutes can you walk? 20-30 ability to climb steps?  yes do you drive?  no  Function retired I need assistance with the following:  bathing, meal prep and household duties  Neuro/Psych bladder control problems bowel control problems weakness  Prior Studies Any changes since last visit?  no  Physicians involved in your care Any changes since last visit?  no   Family History  Problem Relation Age of Onset  . Heart failure Mother   . Heart  attack Father   . [name] Father   . Heart attack Brother   . [name] Brother   . Colon cancer Brother   . Colon cancer Sister    Social History   Socioeconomic History  . Marital status: Married    Spouse name: Not on file  . Number of children: Not on file  . Years of education: Not on file  . Highest education level: Not on file  Occupational History  . Occupation: retired  Scientific laboratory technician  . Financial resource strain: Not on file  . Food insecurity:    Worry: Not on file    Inability: Not on file  . Transportation needs:    Medical: Not on file    Non-medical: Not on file  Tobacco Use  . Smoking status: Former Smoker    Years: 3.00    Types: Cigarettes    Last attempt to quit: 1958    Years since quitting: 61.4  . Smokeless tobacco: Never Used  Substance and Sexual Activity  . Alcohol use: No  . Drug use: No  . Sexual activity: Not on file  Lifestyle  . Physical activity:    Days per week: Not on file    Minutes per session: Not on file  . Stress: Not on file  Relationships  . Social connections:    Talks on phone: Not on file    Gets together: Not on  file    Attends religious service: Not on file    Active member of club or organization: Not on file    Attends meetings of clubs or organizations: Not on file    Relationship status: Not on file  Other Topics Concern  . Not on file  Social History Narrative  . Not on file   Past Surgical History:  Procedure Laterality Date  . APPENDECTOMY  1950  . BACK SURGERY    . BURR HOLE Bilateral 01/25/2018   Procedure: Haskell Flirt;  Surgeon: Kary Kos, MD;  Location: [facility];  Service: Neurosurgery;  Laterality: Bilateral;  . BURR HOLE Left 02/20/2018   Procedure: BURR HOLES for subdural hematoma;  Surgeon: Newman Pies, MD;  Location: [facility];  Service: Neurosurgery;  Laterality: Left;  . BURR HOLE Left 03/20/2018   Procedure: Craniotomy for subdural hematoma;  Surgeon: Kristeen Miss, MD;  Location: [facility];  Service:  Neurosurgery;  Laterality: Left;  . FOREARM FRACTURE SURGERY Right 1954  . LUMBAR DISC SURGERY  1959   ruptured disc repair  . RIGHT/LEFT HEART CATH AND CORONARY ANGIOGRAPHY N/A 11/21/2017   Procedure: RIGHT/LEFT HEART CATH AND CORONARY ANGIOGRAPHY;  Surgeon: Sherren Mocha, MD;  Location: [facility];  Service: Cardiovascular;  Laterality: N/A;  . SKIN CANCER EXCISION Left    "side of my nose"   Past Medical History:  Diagnosis Date  . Atrial fibrillation, chronic (HCC)    Not on anticoagulation due to history of bilateral subdural bleeds due to recurrent falls  . BPH (benign prostatic hypertrophy)   . [name] (coronary artery disease), native coronary artery    Severe two-vessel [name] with chronically occluded LAD and RCA with widely patent left main, intermediate, and left circumflex branches.  On medical management.  He has extensive collaterals.  . 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 fib with RVR  . [name] (degenerative joint disease)   . GERD (gastroesophageal reflux disease)   . Hyperlipidemia    LDL goal less then 100  . Hypertension   . Microscopic hematuria    [name]  . Mitral valve prolapse    with moderate MR  . Myocardial infarction [facility])    "was told 11/2017 that he'd had a heart attack; don't know when it was" (02/19/2018)  . OSA (obstructive sleep apnea)    intolerant with CPAP (02/19/2018)  . SDH (subdural hematoma) ([place]) 01/2018  . SIADH (syndrome of inappropriate ADH production) ([place])   . Skin cancer    "side of my nose" (11/19/2017)  . Stroke ([place]) 12/2017   "fully recovered"  . Thrombocytopenia (HCC)    Mild- platelet count 143,000 on 02/2011, stable 08/2011   BP 126/75 (BP Location: Left Arm, Patient Position: Sitting, Cuff Size: Normal)   Pulse 78   Resp 14   Ht 5\' 10"  (1.778 m)   Wt 171 lb (77.6  kg)   SpO2 98%   BMI 24.54 kg/m   Opioid Risk Score:   Fall Risk Score:  `1  Depression screen PHQ 2/9  Depression screen PHQ 2/9 03/12/2018  Decreased Interest 0  Down, Depressed, Hopeless 0  PHQ - 2 Score 0  Altered sleeping 3  Tired, decreased energy 3  Change in appetite 2  Feeling bad or failure about yourself  0  Trouble concentrating 0  Moving slowly  or fidgety/restless 1  Suicidal thoughts 0  PHQ-9 Score 9  Difficult doing work/chores Somewhat difficult    Review of Systems  Constitutional: Negative.   HENT: Negative.   Eyes: Negative.   Respiratory: Negative.   Cardiovascular: Negative.   Gastrointestinal: Negative.   Endocrine: Negative.   Genitourinary: Negative.   Musculoskeletal: Negative.   Skin: Negative.   Allergic/Immunologic: Negative.   Neurological: Negative.   Hematological: Negative.   Psychiatric/Behavioral: Negative.   All other systems reviewed and are negative.      Objective:   Physical Exam Patient is oriented to person place and time.  He has some difficulty with explaining where he was in the hospital does some confabulation and circumlocution however overall orientation has improved since hospitalization. Is motor strength is 5/5 bilateral deltoid bicep tricep grip hip flexion extensor ankle dorsiflexor Extremities no clubbing cyanosis or edema Scalp no evidence of trauma.  Well-healed bilateral craniotomy incisions. Ambulates with standby assist short step length wide base of support decreased arm swing.       Assessment & Plan:  1.  History of recurrent falls with bilateral subdural hematomas.  Thus far he has had no recurrent falls since hospitalization.  I would continue home health therapy for the next week or 2 and then convert to outpatient therapy. His cognitive recovery may be more prolonged up to 1 year. I will see the patient back in about 6 weeks May benefit from neuropsychology follow-up as well

## 2018-04-23 NOTE — Patient Instructions (Signed)
Walk with your walker if no one is holding on to you

## 2018-04-24 ENCOUNTER — Ambulatory Visit (HOSPITAL_BASED_OUTPATIENT_CLINIC_OR_DEPARTMENT_OTHER): Payer: Medicare Other | Attending: Internal Medicine | Admitting: Internal Medicine

## 2018-04-24 VITALS — Ht 70.0 in | Wt 160.0 lb

## 2018-04-24 DIAGNOSIS — R0683 Snoring: Secondary | ICD-10-CM | POA: Insufficient documentation

## 2018-04-24 DIAGNOSIS — G4730 Sleep apnea, unspecified: Secondary | ICD-10-CM

## 2018-04-24 DIAGNOSIS — G4733 Obstructive sleep apnea (adult) (pediatric): Secondary | ICD-10-CM | POA: Diagnosis not present

## 2018-04-24 DIAGNOSIS — G4710 Hypersomnia, unspecified: Secondary | ICD-10-CM

## 2018-04-25 ENCOUNTER — Other Ambulatory Visit: Payer: Self-pay | Admitting: Physical Medicine and Rehabilitation

## 2018-04-27 NOTE — Telephone Encounter (Signed)
rec'd electronic request for Digoxin.  Dr. Letta Pate is not following patient for cardiology.  Rx referred to Dr. Fransico Him, Cardiologist for approval

## 2018-05-03 NOTE — Procedures (Signed)
   NAME: Ryan Savage DATE OF BIRTH:  June 07, 1929 MEDICAL RECORD NUMBER [id number]  [facility]  PHYSICIAN: Marius Ditch  DATE OF STUDY: 04/24/2018  SLEEP STUDY TYPE: Positive Airway Pressure Titration               REFERRING PHYSICIAN: Marius Ditch, MD  INDICATION FOR STUDY: moderate OSA noted on HSAT  EPWORTH SLEEPINESS SCORE:   HEIGHT: 5\' 10"  (177.8 cm)  WEIGHT: 160 lb (72.6 kg)    Body mass index is 22.96 kg/m.  NECK SIZE:   in.  MEDICATIONS  Patient self administered medications include: METOPROLOL, MELATONIN. Medications administered during study include Sleep medicine administered - Melatonin 3mg  at 09:39:57 PM.   SLEEP STUDY TECHNIQUE  The patient underwent an attended overnight polysomnography titration to assess the effects of cpap therapy. The following variables were monitored: EEG(C4-A1, C3-A2, O1-A2, O2-A1), EOG, submental and leg EMG, ECG, oxyhemoglobin saturation by pulse oximetry, thoracic and abdominal respiratory effort belts, nasal/oral airflow by pressure sensor, body position sensor and snoring sensor. CPAP pressure was titrated to eliminate apneas, hypopneas and oxygen desaturation.   TECHNICAL COMMENTS  Comments added by Technician: Patient had difficulty initiating sleep. Comments added by Scorer: N/A   SLEEP ARCHITECTURE  The study was initiated at 10:56:41 PM and terminated at 4:55:03 AM. Total recorded time was 358.4 minutes. EEG confirmed total sleep time was 130 minutes yielding a sleep efficiency of 36.3%%. Sleep onset after lights out was 5.1 minutes with a REM latency of 120.5 minutes. The patient spent 12.7%% of the night in stage N1 sleep, 78.1%% in stage N2 sleep, 0.0%% in stage N3 and 9.23% in REM. The Arousal Index was 8.8/hour.   RESPIRATORY PARAMETERS  The overall AHI was 9.7 per hour, and the RDI was 9.7 events/hour with a central apnea index of 1.4 per hour. The most appropriate setting of CPAP was  IPAP/EPAP 11/11 cm H2O. At this setting, the sleep efficiency was 12% and the patient was supine for 100%. The AHI was 19.3 events per hour, and the RDI was 19.3 events/hour (with 1.4 central events) and the arousal index was 9.7 per hour.The oxygen nadir was 90.0% during sleep. The patient was titrated to a pressure of 11 cm water pressure.   LEG MOVEMENT DATA  The total leg movements were 268 with a resulting leg movement index of 123.7/hr. Associated arousal with leg movement index was 3.2/hr.   CARDIAC DATA  The underlying cardiac rhythm was most consistent with sinus rhythm. Mean heart rate during sleep was 66.7 bpm. Additional rhythm abnormalities include Atrial Fibrillation.   IMPRESSIONS  Poor sleep efficiency Probably adequate CPAP titration. Most importantly, no emergence of significant central apnea in this patient with a recent stroke.  Electrocardiographic data showed presence of Atrial Fibrillation. Significant periodic leg movements(PLMs) during sleep. However, no significant associated arousals.  DIAGNOSIS  Obstructive Sleep Apnea (327.23 [G47.33 ICD-10])  RECOMMENDATIONS  Trial of auto adjusting CPAP therapy 4-12 cm H2O with a Large size Resmed Full Face Mask AirFit F20 mask and heated humidification. Close follow-up is recommended to be sure pressure is adequate and central apnea does not emerge.    Marius Ditch Sleep specialist, [place] Board of Internal Medicine  ELECTRONICALLY SIGNED ON:  05/03/2018, 8:55 PM [place] PH: [phone]   FX: [phone] [place]

## 2018-05-05 ENCOUNTER — Telehealth: Payer: Self-pay

## 2018-05-05 DIAGNOSIS — S065XAA Traumatic subdural hemorrhage with loss of consciousness status unknown, initial encounter: Secondary | ICD-10-CM

## 2018-05-05 DIAGNOSIS — S065X9A Traumatic subdural hemorrhage with loss of consciousness of unspecified duration, initial encounter: Secondary | ICD-10-CM

## 2018-05-05 NOTE — Telephone Encounter (Signed)
[facility],PT/[facility] called stating she recommends pt be referred to [facility]patient [facility] on [address].

## 2018-05-07 ENCOUNTER — Other Ambulatory Visit: Payer: Self-pay | Admitting: Neurological Surgery

## 2018-05-07 DIAGNOSIS — S065X9A Traumatic subdural hemorrhage with loss of consciousness of unspecified duration, initial encounter: Secondary | ICD-10-CM

## 2018-05-07 DIAGNOSIS — S065XAA Traumatic subdural hemorrhage with loss of consciousness status unknown, initial encounter: Secondary | ICD-10-CM

## 2018-05-11 NOTE — Telephone Encounter (Signed)
Please order outpatient PT at neuro rehab

## 2018-05-12 NOTE — Telephone Encounter (Signed)
Order placed

## 2018-05-13 ENCOUNTER — Other Ambulatory Visit: Payer: Self-pay

## 2018-05-13 ENCOUNTER — Ambulatory Visit (HOSPITAL_COMMUNITY): Payer: Medicare Other | Attending: Cardiology

## 2018-05-13 DIAGNOSIS — I5022 Chronic systolic (congestive) heart failure: Secondary | ICD-10-CM | POA: Diagnosis not present

## 2018-05-13 DIAGNOSIS — Z8673 Personal history of transient ischemic attack (TIA), and cerebral infarction without residual deficits: Secondary | ICD-10-CM | POA: Insufficient documentation

## 2018-05-13 DIAGNOSIS — I083 Combined rheumatic disorders of mitral, aortic and tricuspid valves: Secondary | ICD-10-CM | POA: Diagnosis not present

## 2018-05-13 DIAGNOSIS — I4891 Unspecified atrial fibrillation: Secondary | ICD-10-CM | POA: Insufficient documentation

## 2018-05-19 ENCOUNTER — Telehealth: Payer: Self-pay

## 2018-05-19 NOTE — Telephone Encounter (Signed)
Error

## 2018-05-20 ENCOUNTER — Ambulatory Visit
Admission: RE | Admit: 2018-05-20 | Discharge: 2018-05-20 | Disposition: A | Discharge status: Home / Self Care | Payer: Medicare Other | Source: Ambulatory Visit | Attending: Neurological Surgery | Admitting: Neurological Surgery

## 2018-05-20 DIAGNOSIS — S065X9A Traumatic subdural hemorrhage with loss of consciousness of unspecified duration, initial encounter: Secondary | ICD-10-CM

## 2018-05-20 DIAGNOSIS — S065XAA Traumatic subdural hemorrhage with loss of consciousness status unknown, initial encounter: Secondary | ICD-10-CM

## 2018-05-21 ENCOUNTER — Telehealth: Payer: Self-pay | Admitting: Cardiology

## 2018-05-21 ENCOUNTER — Telehealth: Payer: Self-pay | Admitting: *Deleted

## 2018-05-21 NOTE — Telephone Encounter (Signed)
Patient's wife called and left a message asking for Reesa Chew, PA-C to call her back.  She is inquiring about the medication Delene Loll) that was discontinued at discharge.  She is asking for a call back to find out why it was stopped.

## 2018-05-21 NOTE — Telephone Encounter (Signed)
I spoke with patient's spouse Enid Derry (dpr on file). She was seeking clarification regarding when pt's Ryan Savage was d/c'd. Per pt's chart I informed her that is was d/c'd while the patient was in the hospital and she can follow up during pt's OV tomorrow if [name] would like to restart. She stated understanding and thankful for the call.

## 2018-05-21 NOTE — Telephone Encounter (Signed)
Follow Up:    Wife said you had called about his Delene Loll this week.She wanted you to know that she still have not found out why his Delene Loll was discontinued.

## 2018-05-21 NOTE — Telephone Encounter (Signed)
Called wife regarding Entresto question. Chart reviewed and reminded patient that he was not on this med during April or May admissions due to issues with hypotension and high risk of falls due to orthostasis. Advised her to discuss this with cardiology.   She reported that patient is getting stronger but still has "some air on his brain". He wants to drive again--- advised her that patient does not sound ready to be cleared for this and she plans on discussing this with Dr. Ellene Route.

## 2018-05-22 ENCOUNTER — Ambulatory Visit: Payer: Medicare Other | Admitting: Cardiology

## 2018-05-22 ENCOUNTER — Encounter: Payer: Self-pay | Admitting: Cardiology

## 2018-05-22 VITALS — BP 126/78 | HR 72 | Ht 70.0 in | Wt 175.0 lb

## 2018-05-22 DIAGNOSIS — E7800 Pure hypercholesterolemia, unspecified: Secondary | ICD-10-CM

## 2018-05-22 DIAGNOSIS — I272 Pulmonary hypertension, unspecified: Secondary | ICD-10-CM | POA: Diagnosis not present

## 2018-05-22 DIAGNOSIS — I482 Chronic atrial fibrillation: Secondary | ICD-10-CM

## 2018-05-22 DIAGNOSIS — I350 Nonrheumatic aortic (valve) stenosis: Secondary | ICD-10-CM

## 2018-05-22 DIAGNOSIS — I5022 Chronic systolic (congestive) heart failure: Secondary | ICD-10-CM

## 2018-05-22 DIAGNOSIS — I10 Essential (primary) hypertension: Secondary | ICD-10-CM | POA: Diagnosis not present

## 2018-05-22 DIAGNOSIS — I2510 Atherosclerotic heart disease of native coronary artery without angina pectoris: Secondary | ICD-10-CM

## 2018-05-22 DIAGNOSIS — I341 Nonrheumatic mitral (valve) prolapse: Secondary | ICD-10-CM

## 2018-05-22 DIAGNOSIS — I4821 Permanent atrial fibrillation: Secondary | ICD-10-CM

## 2018-05-22 HISTORY — DX: Nonrheumatic aortic (valve) stenosis: I35.0

## 2018-05-22 LAB — DIGOXIN LEVEL: Digoxin, Serum: 0.9 ng/mL (ref 0.5–0.9)

## 2018-05-22 NOTE — Patient Instructions (Signed)
Medication Instructions:  Your physician recommends that you continue on your current medications as directed. Please refer to the Current Medication list given to you today.  If you need a refill on your cardiac medications, please contact your pharmacy first.  Labwork: Today for digoxin level   Testing/Procedures: None ordered   Follow-Up: Your physician wants you to follow-up in: 6 months with [name]. You will receive a reminder letter in the mail two months in advance. If you don't receive a letter, please call our office to schedule the follow-up appointment.  Any Other Special Instructions Will Be Listed Below (If Applicable).   Thank you for choosing [facility], RN  [phone]  If you need a refill on your cardiac medications before your next appointment, please call your pharmacy.

## 2018-05-22 NOTE — Progress Notes (Signed)
Cardiology Office Note:    Date:  05/22/2018   ID:  Ryan Savage, DOB 09-26-1929, MRN [id number]  PCP:  Lajean Manes, MD  Cardiologist:  Fransico Him, MD    Referring MD: Lajean Manes, MD   Chief Complaint  Patient presents with  . Coronary Artery Disease  . Hypertension  . Atrial Fibrillation  . Hyperlipidemia    History of Present Illness:    Ryan Savage is a 84 y.o. male with a hx of posterior MV leaflet prolapse withmoderateMR, tricuspid valve prolapse with mild to moderate TR, moderate pulmonary hypertension, HTN and chronic atrial fibrillation on chronic anticoagulation.  He was hospitalized in January with CHF.   Echo showed  EF 30-35% with hypokinesis of the inferior, mid to apical anterior septal, inferior septal and apical anterior as well as apical myocardium.   He underwent right and left heart catheterization showing severe two-vessel coronary disease with chronic total occlusions of the LAD and RCA with extensive collaterals.  His left main, circumflex and intermediate branches were all normal.  He had low right and left-sided intracardiac filling pressures.  Mitral regurgitation  appeared mild to moderate and there were no significant V waves on a pulmonary capillary wedge tracings.  It was felt his mitral regurgitation was not playing a role in his acute exacerbation of CHF.  He was started on digoxin, Toprol and Lasix as well as Entresto.  Unfortunately he presented back to the emergency room on 01/23/2018 with complaints of weakness in his left leg and being off balance.  CT of the head showed revealed bilateral subdural hematomas with 9 mm right to left shift and his INR was reversed with vitamin K and K Centra.  He underwent bilateral bur holes for evacuation of subacute to chronic subdural hematomas.  He was discharged home on 01/29/2018 and was in rehab.  He was then readmitted twice with recurrent bleeds.  He is being followed by Neurosurgery.  His Entresto was  stopped in the hospital due to soft BP.  He is here today for followup and is doing well.  He denies any chest pain or pressure, SOB, DOE, PND, orthopnea,dizziness, palpitations or syncope.  He has chronic LE edema.  He is compliant with his meds and is tolerating meds with no SE.    Past Medical History:  Diagnosis Date  . Aortic stenosis 05/22/2018   Mild with mean AVG 40mmHg by echo 05/2018  . Atrial fibrillation, chronic (HCC)    Not on anticoagulation due to history of bilateral subdural bleeds due to recurrent falls  . BPH (benign prostatic hypertrophy)   . [name] (coronary artery disease), native coronary artery    Severe two-vessel [name] with chronically occluded LAD and RCA with widely patent left main, intermediate, and left circumflex branches.  On medical management.  He has extensive collaterals.  . 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 fib with RVR.  Repeat echo 05/2018 with EF 45-50%.  . [name] (degenerative joint disease)   . GERD (gastroesophageal reflux disease)   . Hyperlipidemia    LDL goal less then 100  . Hypertension   . Microscopic hematuria    [name]  . Mitral valve prolapse    with mild MR  . OSA (obstructive sleep apnea)    intolerant with CPAP (02/19/2018)  . Pulmonary HTN ([place])  PASP 75mmHg by echo 05/2018 but normal pressures on right heart cath.  . SDH (subdural hematoma) ([place]) 01/2018  . SIADH (syndrome of inappropriate ADH production) ([place])   . Skin cancer    "side of my nose" (11/19/2017)  . Stroke ([place]) 12/2017   "fully recovered"  . Thrombocytopenia (HCC)    Mild- platelet count 143,000 on 02/2011, stable 08/2011    Past Surgical History:  Procedure Laterality Date  . APPENDECTOMY  1950  . BACK SURGERY    . BURR HOLE Bilateral 01/25/2018   Procedure: Haskell Flirt;  Surgeon: Kary Kos, MD;  Location: [employer] [facility];   Service: Neurosurgery;  Laterality: Bilateral;  . BURR HOLE Left 02/20/2018   Procedure: BURR HOLES for subdural hematoma;  Surgeon: Newman Pies, MD;  Location: [facility];  Service: Neurosurgery;  Laterality: Left;  . BURR HOLE Left 03/20/2018   Procedure: Craniotomy for subdural hematoma;  Surgeon: Kristeen Miss, MD;  Location: [facility];  Service: Neurosurgery;  Laterality: Left;  . FOREARM FRACTURE SURGERY Right 1954  . LUMBAR DISC SURGERY  1959   ruptured disc repair  . RIGHT/LEFT HEART CATH AND CORONARY ANGIOGRAPHY N/A 11/21/2017   Procedure: RIGHT/LEFT HEART CATH AND CORONARY ANGIOGRAPHY;  Surgeon: Sherren Mocha, MD;  Location: [facility];  Service: Cardiovascular;  Laterality: N/A;  . SKIN CANCER EXCISION Left    "side of my nose"    Current Medications: Current Meds  Medication Sig  . acetaminophen (TYLENOL) 325 MG tablet Take 1-2 tablets (325-650 mg total) by mouth every 4 (four) hours as needed for mild pain.  Marland Kitchen DIGOX 125 MCG tablet TAKE 1 TABLET(0.125 MG) BY MOUTH DAILY  . finasteride (PROSCAR) 5 MG tablet Take 1 tablet (5 mg total) by mouth daily.  . fluticasone (FLONASE) 50 MCG/ACT nasal spray Place 1 spray into both nostrils daily.  . Melatonin 3 MG TABS Take by mouth.  . metoprolol succinate (TOPROL-XL) 25 MG 24 hr tablet Take 1 tablet (25 mg total) by mouth daily.  . Multiple Vitamin (MULTIVITAMIN WITH MINERALS) TABS tablet Take 1 tablet by mouth daily.  . simvastatin (ZOCOR) 10 MG tablet Take 1 tablet by mouth daily.     Allergies:   Methylphenidate derivatives and Novocain [procaine]   Social History   Socioeconomic History  . Marital status: Married    Spouse name: Not on file  . Number of children: Not on file  . Years of education: Not on file  . Highest education level: Not on file  Occupational History  . Occupation: retired  Scientific laboratory technician  . Financial resource strain: Not on file  . Food insecurity:    Worry: Not on file    Inability: Not on file    . Transportation needs:    Medical: Not on file    Non-medical: Not on file  Tobacco Use  . Smoking status: Former Smoker    Years: 3.00    Types: Cigarettes    Last attempt to quit: 1958    Years since quitting: 61.5  . Smokeless tobacco: Never Used  Substance and Sexual Activity  . Alcohol use: No  . Drug use: No  . Sexual activity: Not on file  Lifestyle  . Physical activity:    Days per week: Not on file    Minutes per session: Not on file  . Stress: Not on file  Relationships  . Social connections:    Talks on phone: Not on file    Gets together: Not on file  Attends religious service: Not on file    Active member of club or organization: Not on file    Attends meetings of clubs or organizations: Not on file    Relationship status: Not on file  Other Topics Concern  . Not on file  Social History Narrative  . Not on file     Family History: The patient's family history includes [name] in his brother and father; Colon cancer in his brother and sister; Heart attack in his brother and father; Heart failure in his mother.  ROS:   Please see the history of present illness.    ROS  All other systems reviewed and negative.   EKGs/Labs/Other Studies Reviewed:    The following studies were reviewed today: none  EKG:  EKG is not ordered today.    Recent Labs: 11/27/2017: Magnesium 2.0 02/20/2018: B Natriuretic Peptide 685.1; TSH 0.224 03/26/2018: ALT 18 04/01/2018: BUN 12; Creatinine, Ser 0.72; Hemoglobin 11.4; Platelets 197; Potassium 3.9; Sodium 133   Recent Lipid Panel    Component Value Date/Time   CHOL 76 12/09/2017 0513   TRIG 73 12/09/2017 0513   HDL 25 (L) 12/09/2017 0513   CHOLHDL 3.0 12/09/2017 0513   VLDL 15 12/09/2017 0513   LDLCALC 36 12/09/2017 0513    Physical Exam:    VS:  BP 126/78   Pulse 72   Ht 5\' 10"  (1.778 m)   Wt 175 lb (79.4 kg)   SpO2 98%   BMI 25.11 kg/m     Wt Readings from Last 3 Encounters:  05/22/18 175 lb (79.4 kg)   04/24/18 160 lb (72.6 kg)  04/23/18 171 lb (77.6 kg)     GEN:  Well nourished, well developed in no acute distress HEENT: Normal NECK: No JVD; No carotid bruits LYMPHATICS: No lymphadenopathy CARDIAC: irregularly irregular, no murmurs, rubs, gallops RESPIRATORY:  Clear to auscultation without rales, wheezing or rhonchi  ABDOMEN: Soft, non-tender, non-distended MUSCULOSKELETAL:  Trace edema; No deformity  SKIN: Warm and dry NEUROLOGIC:  Alert and oriented x 3 PSYCHIATRIC:  Normal affect   ASSESSMENT:    1. Permanent atrial fibrillation ([place])   2. Benign essential HTN   3. Mitral valve prolapse   4. Coronary artery disease involving native coronary artery of native heart without angina pectoris   5. Chronic systolic CHF (congestive heart failure), NYHA class 2 ([place])   6. Nonrheumatic aortic valve stenosis   7. Pure hypercholesterolemia   8. Pulmonary HTN ([place])    PLAN:    In order of problems listed above:  1.  Permanent atrial fibrillation -he is well rate controlled on exam today.  Continue on digoxin 0.125 mg daily and Toprol XL 25 mg daily.  He is off anticoagulation due to recent bilateral subdural hematomas that reoccurred 3 times.  Check dig level.  2.  Hypertension - BP is well controlled exam today.  He will continue on Toprol XL 25 mg daily.  3.  Mitral valve prolapse with mitral regurgitation -recent 2D echocardiogram showed mildly thickened mitral valve leaflets with late systolic prolapse of the posterior leaflet and mild mitral regurgitation.  4.  ASCAD - cath showed severe two-vessel coronary disease with chronic total occlusions of the LAD and RCA with extensive collaterals.  His left main, circumflex and intermediate branches were all normal.  He had low right and left-sided intracardiac filling pressures.  He denies any anginal symptoms.  5.  Chronic systolic CHF NYHA class 2 -he appears euvolemic on exam today.  His weight is stable.  Repeat echocardiogram  last week showed improvement in LV function with EF 45 to 50% with apical hypokinesis.  His Entresto was stopped in the hospital due to soft Bps. Continue BB.  I am not going to restart ARB as his EF has improved and I do not want to drop his BP too low and then risk more falls.  6.  Mild aortic stenosis with mean aortic valve gradient 6 mmHg on echo 05/13/2018.  7.  Moderate pulmonary hypertension echo but normal pressures on right heart catheterization.  This is likely group 2 pulmonary hypertension secondary to CHF.  8.  Hyperlipidemia with LDL goal less than 70 -LDL was at goal at 36 on 12/09/2017 and ALT was normal at 18.  He will continue on simvastatin 10 mg daily.   Medication Adjustments/Labs and Tests Ordered: Current medicines are reviewed at length with the patient today.  Concerns regarding medicines are outlined above.  No orders of the defined types were placed in this encounter.  No orders of the defined types were placed in this encounter.   Signed, Fransico Him, MD  05/22/2018 11:14 AM    [place]

## 2018-05-25 ENCOUNTER — Telehealth: Payer: Self-pay | Admitting: Cardiology

## 2018-05-25 NOTE — Telephone Encounter (Signed)
Pt's wife calling   Pt had his Digoxin checked and wife want to know if it is gong to be changed. He is currently taking .125mg . Please advise

## 2018-05-28 MED ORDER — DIGOXIN 125 MCG PO TABS: 0.1250 mg | ORAL_TABLET | Freq: Every day | ORAL | 11 refills | Status: DC

## 2018-05-28 MED ORDER — METOPROLOL SUCCINATE ER 25 MG PO TB24: 25.0000 mg | ORAL_TABLET | Freq: Every day | ORAL | 11 refills | Status: DC

## 2018-05-28 NOTE — Telephone Encounter (Signed)
Follow Up:    Wife needs to know after pt's lab work, will his Digoxin be increased? If so, he will need a new prescription called to [facility] on [address] please.

## 2018-05-28 NOTE — Telephone Encounter (Signed)
Call placed to Pt's wife. Advised of normal labs and for Pt to continue current doses of medications.  Wife asked for refills to be sent to [facility]. Sent per request.

## 2018-06-01 ENCOUNTER — Telehealth: Payer: Self-pay | Admitting: Cardiology

## 2018-06-01 NOTE — Telephone Encounter (Signed)
[facility] back. Patient's old digoxin is on back order Informed them that a different manufacturer is fine. Patient's recent labs show his digoxin level is normal and he is to continue with his medication. Will send to [name] so she is aware.

## 2018-06-01 NOTE — Telephone Encounter (Signed)
New Message   Pt c/o medication issue:  1. Name of Medication: digoxin (DIGOX) 0.125 MG tablet  2. How are you currently taking this medication (dosage and times per day)? Take 1 tablet (0.125 mg total) by mouth daily.  3. Are you having a reaction (difficulty breathing--STAT)? no  4. What is your medication issue? [facility] pharmacy is calling to get approval that its ok for them to switch to a different manufacturer

## 2018-06-05 ENCOUNTER — Ambulatory Visit: Payer: Medicare Other | Admitting: Physical Medicine & Rehabilitation

## 2018-06-05 ENCOUNTER — Other Ambulatory Visit: Payer: Self-pay | Admitting: Neurological Surgery

## 2018-06-05 DIAGNOSIS — S065X9A Traumatic subdural hemorrhage with loss of consciousness of unspecified duration, initial encounter: Secondary | ICD-10-CM

## 2018-06-05 DIAGNOSIS — S065XAA Traumatic subdural hemorrhage with loss of consciousness status unknown, initial encounter: Secondary | ICD-10-CM

## 2018-07-01 ENCOUNTER — Ambulatory Visit
Admission: RE | Admit: 2018-07-01 | Discharge: 2018-07-01 | Disposition: A | Discharge status: Home / Self Care | Payer: Medicare Other | Source: Ambulatory Visit | Attending: Neurological Surgery | Admitting: Neurological Surgery

## 2018-07-01 DIAGNOSIS — S065X9A Traumatic subdural hemorrhage with loss of consciousness of unspecified duration, initial encounter: Secondary | ICD-10-CM

## 2018-07-01 DIAGNOSIS — S065XAA Traumatic subdural hemorrhage with loss of consciousness status unknown, initial encounter: Secondary | ICD-10-CM

## 2018-09-16 ENCOUNTER — Ambulatory Visit
Admission: RE | Admit: 2018-09-16 | Discharge: 2018-09-16 | Disposition: A | Discharge status: Home / Self Care | Payer: Medicare Other | Source: Ambulatory Visit | Attending: Neurological Surgery | Admitting: Neurological Surgery

## 2018-09-16 ENCOUNTER — Other Ambulatory Visit: Payer: Self-pay | Admitting: Neurological Surgery

## 2018-09-16 DIAGNOSIS — T148XXA Other injury of unspecified body region, initial encounter: Secondary | ICD-10-CM

## 2018-11-17 ENCOUNTER — Other Ambulatory Visit: Payer: Self-pay

## 2018-11-17 MED ORDER — DIGOXIN 125 MCG PO TABS: 0.1250 mg | ORAL_TABLET | Freq: Every day | ORAL | 1 refills | Status: DC

## 2018-11-17 MED ORDER — METOPROLOL SUCCINATE ER 25 MG PO TB24: 25.0000 mg | ORAL_TABLET | Freq: Every day | ORAL | 1 refills | Status: DC

## 2018-11-17 NOTE — Telephone Encounter (Signed)
Pt's wife called in stating that they will no longer be using [facility] as their primary pharmacy and would like the medications sent to [facility].

## 2019-01-05 ENCOUNTER — Encounter: Payer: Self-pay | Admitting: Cardiology

## 2019-01-05 ENCOUNTER — Ambulatory Visit: Payer: Medicare Other | Admitting: Cardiology

## 2019-01-05 VITALS — BP 138/80 | HR 57 | Ht 70.0 in | Wt 183.0 lb

## 2019-01-05 DIAGNOSIS — I10 Essential (primary) hypertension: Secondary | ICD-10-CM

## 2019-01-05 DIAGNOSIS — I5022 Chronic systolic (congestive) heart failure: Secondary | ICD-10-CM

## 2019-01-05 DIAGNOSIS — I4821 Permanent atrial fibrillation: Secondary | ICD-10-CM

## 2019-01-05 DIAGNOSIS — I2510 Atherosclerotic heart disease of native coronary artery without angina pectoris: Secondary | ICD-10-CM

## 2019-01-05 DIAGNOSIS — G4733 Obstructive sleep apnea (adult) (pediatric): Secondary | ICD-10-CM

## 2019-01-05 DIAGNOSIS — I272 Pulmonary hypertension, unspecified: Secondary | ICD-10-CM

## 2019-01-05 DIAGNOSIS — I340 Nonrheumatic mitral (valve) insufficiency: Secondary | ICD-10-CM | POA: Diagnosis not present

## 2019-01-05 DIAGNOSIS — I350 Nonrheumatic aortic (valve) stenosis: Secondary | ICD-10-CM

## 2019-01-05 DIAGNOSIS — E7800 Pure hypercholesterolemia, unspecified: Secondary | ICD-10-CM

## 2019-01-05 NOTE — Progress Notes (Signed)
Cardiology Office Note:    Date:  01/05/2019   ID:  Ryan Savage, DOB 05/22/1929, MRN [id number]  PCP:  Lajean Manes, MD  Cardiologist:  Fransico Him, MD    Referring MD: Lajean Manes, MD   Chief Complaint  Patient presents with  . Atrial Fibrillation  . Mitral Regurgitation  . Cardiomyopathy  . Congestive Heart Failure    History of Present Illness:    Ryan Savage is a 85 y.o. male with a hx of posterior MV leaflet prolapse withmoderateMR,tricuspid valve prolapse with mild to moderate TR, moderate pulmonary hypertension,HTN and chronic atrial fibrillation on chronic anticoagulation.He has a known ischemic dilated cardiomyopathy with EF 30-35% with hypokinesis of the inferior, mid to apical anterior septal, inferior septal and apical anterior as well as apical myocardium.   Cath showed severe two-vessel coronary disease with chronic total occlusions of the LAD and RCA with extensive collaterals. His left main, circumflex and intermediate branches were all normal. He had low right and left-sided intracardiac filling pressures. Mitral regurgitation  appeared mild to moderate and there were no significant V waves on a pulmonary capillary wedge tracings. It was felt his mitral regurgitation was not playing a role in his acute exacerbation of CHF.  He was started on digoxin, Toprol and Lasix as well as Entresto.  He has a history of bilateral subdural hematomas with 9 mm right to left shift and his INR was reversed with vitamin K and K Centra 01/28/2018. He underwent bilateral bur holes for evacuation of subacute to chronic subdural hematomas. He was discharged home on 01/29/2018 and was in rehab.  He was then readmitted twice with recurrent bleeds.  He is being followed by Neurosurgery.  His Entresto was stopped in the hospital due to soft BP. He He is here today for followup and is doing well.  he denies any chest pain or pressure, SOB, DOE, PND, orthopnea, LE edema,  dizziness, palpitations or syncope. He is compliant with his meds and is tolerating meds with no SE. he is back using his CPAP and actually doing quite well.  He is tolerating the mask and feels rested in the morning without any daytime sleepiness.  Past Medical History:  Diagnosis Date  . Aortic stenosis 05/22/2018   Mild with mean AVG 41mmHg by echo 05/2018  . Atrial fibrillation, chronic    Not on anticoagulation due to history of bilateral subdural bleeds due to recurrent falls  . BPH (benign prostatic hypertrophy)   . [name] (coronary artery disease), native coronary artery    Severe two-vessel [name] with chronically occluded LAD and RCA with widely patent left main, intermediate, and left circumflex branches.  On medical management.  He has extensive collaterals.  . 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 fib with RVR.  Repeat echo 05/2018 with EF 45-50%.  . [name] (degenerative joint disease)   . GERD (gastroesophageal reflux disease)   . Hyperlipidemia    LDL goal less then 100  . Hypertension   . Microscopic hematuria    [name]  . Mitral valve prolapse    with mild MR  . OSA (obstructive sleep apnea)   . Pulmonary HTN (HCC)    PASP 22mmHg by echo 05/2018 but normal pressures on right heart cath.  . SDH (subdural hematoma) ([place]) 01/2018  . SIADH (syndrome of inappropriate ADH production) ([place])   .  Skin cancer    "side of my nose" (11/19/2017)  . Stroke ([place]) 12/2017   "fully recovered"  . Thrombocytopenia (HCC)    Mild- platelet count 143,000 on 02/2011, stable 08/2011    Past Surgical History:  Procedure Laterality Date  . APPENDECTOMY  1950  . BACK SURGERY    . BURR HOLE Bilateral 01/25/2018   Procedure: Haskell Flirt;  Surgeon: Kary Kos, MD;  Location: [facility];  Service: Neurosurgery;  Laterality: Bilateral;  . BURR HOLE Left 02/20/2018    Procedure: BURR HOLES for subdural hematoma;  Surgeon: Newman Pies, MD;  Location: [facility];  Service: Neurosurgery;  Laterality: Left;  . BURR HOLE Left 03/20/2018   Procedure: Craniotomy for subdural hematoma;  Surgeon: Kristeen Miss, MD;  Location: [facility];  Service: Neurosurgery;  Laterality: Left;  . FOREARM FRACTURE SURGERY Right 1954  . LUMBAR DISC SURGERY  1959   ruptured disc repair  . RIGHT/LEFT HEART CATH AND CORONARY ANGIOGRAPHY N/A 11/21/2017   Procedure: RIGHT/LEFT HEART CATH AND CORONARY ANGIOGRAPHY;  Surgeon: Sherren Mocha, MD;  Location: [facility];  Service: Cardiovascular;  Laterality: N/A;  . SKIN CANCER EXCISION Left    "side of my nose"    Current Medications: Current Meds  Medication Sig  . acetaminophen (TYLENOL) 325 MG tablet Take 1-2 tablets (325-650 mg total) by mouth every 4 (four) hours as needed for mild pain.  Marland Kitchen digoxin (DIGOX) 0.125 MG tablet Take 1 tablet (0.125 mg total) by mouth daily.  . finasteride (PROSCAR) 5 MG tablet Take 1 tablet (5 mg total) by mouth daily.  . Melatonin 3 MG TABS Take by mouth.  . metoprolol succinate (TOPROL-XL) 25 MG 24 hr tablet Take 1 tablet (25 mg total) by mouth daily.  . Multiple Vitamin (MULTIVITAMIN WITH MINERALS) TABS tablet Take 1 tablet by mouth daily.  . simvastatin (ZOCOR) 10 MG tablet Take 1 tablet by mouth daily.     Allergies:   Methylphenidate derivatives and Novocain [procaine]   Social History   Socioeconomic History  . Marital status: Married    Spouse name: Not on file  . Number of children: Not on file  . Years of education: Not on file  . Highest education level: Not on file  Occupational History  . Occupation: retired  Scientific laboratory technician  . Financial resource strain: Not on file  . Food insecurity:    Worry: Not on file    Inability: Not on file  . Transportation needs:    Medical: Not on file    Non-medical: Not on file  Tobacco Use  . Smoking status: Former Smoker    Years: 3.00     Types: Cigarettes    Last attempt to quit: 1958    Years since quitting: 62.1  . Smokeless tobacco: Never Used  Substance and Sexual Activity  . Alcohol use: No  . Drug use: No  . Sexual activity: Not on file  Lifestyle  . Physical activity:    Days per week: Not on file    Minutes per session: Not on file  . Stress: Not on file  Relationships  . Social connections:    Talks on phone: Not on file    Gets together: Not on file    Attends religious service: Not on file    Active member of club or organization: Not on file    Attends meetings of clubs or organizations: Not on file    Relationship status: Not on file  Other Topics  Concern  . Not on file  Social History Narrative  . Not on file     Family History: The patient's family history includes [name] in his brother and father; Colon cancer in his brother and sister; Heart attack in his brother and father; Heart failure in his mother.  ROS:   Please see the history of present illness.    ROS  All other systems reviewed and negative.   EKGs/Labs/Other Studies Reviewed:    The following studies were reviewed today: none  EKG:  EKG is ordered today and showed atrial fibrillation with controlled ventricular response at 63 bpm with occasional PVCs and nonspecific T wave abnormality in the lateral precordial leads.  Recent Labs: 02/20/2018: B Natriuretic Peptide 685.1; TSH 0.224 03/26/2018: ALT 18 04/01/2018: BUN 12; Creatinine, Ser 0.72; Hemoglobin 11.4; Platelets 197; Potassium 3.9; Sodium 133   Recent Lipid Panel    Component Value Date/Time   CHOL 76 12/09/2017 0513   TRIG 73 12/09/2017 0513   HDL 25 (L) 12/09/2017 0513   CHOLHDL 3.0 12/09/2017 0513   VLDL 15 12/09/2017 0513   LDLCALC 36 12/09/2017 0513    Physical Exam:    VS:  BP 138/80   Pulse (!) 57   Ht 5\' 10"  (1.778 m)   Wt 183 lb (83 kg)   SpO2 99%   BMI 26.26 kg/m     Wt Readings from Last 3 Encounters:  01/05/19 183 lb (83 kg)  05/22/18 175 lb  (79.4 kg)  04/24/18 160 lb (72.6 kg)     GEN:  Well nourished, well developed in no acute distress HEENT: Normal NECK: No JVD; No carotid bruits LYMPHATICS: No lymphadenopathy CARDIAC: RRR, no murmurs, rubs, gallops RESPIRATORY:  Clear to auscultation without rales, wheezing or rhonchi  ABDOMEN: Soft, non-tender, non-distended MUSCULOSKELETAL: Trace left lower extremity edema; No deformity  SKIN: Warm and dry NEUROLOGIC:  Alert and oriented x 3 PSYCHIATRIC:  Normal affect   ASSESSMENT:    1. Permanent atrial fibrillation   2. Benign essential HTN   3. Nonrheumatic mitral valve regurgitation   4. Coronary artery disease involving native coronary artery of native heart without angina pectoris   5. Chronic systolic CHF (congestive heart failure), NYHA class 2 ([place])   6. Nonrheumatic aortic valve stenosis   7. Pulmonary HTN ([place])   8. OSA (obstructive sleep apnea)    PLAN:    In order of problems listed above:  1.  Permanent atrial fibrillation -his rate is well controlled on exam today.  He will continue on Toprol-XL 25 mg daily.  He is not on anticoagulation due to multiple subdural bleeds in the past year.  2.  HTN - BP is controlled today in the office.  He will continue on Toprol-XL 25 mg daily.  3.  Mitral regurgitation -last echo showed late systolic prolapse of the posterior mitral valve leaflet with mildly thickened leaflets and mildly MR on 05/2018.    4.  ASCAD -  severe 2 vessel [name] with chronic total occlusions of the LAD and RCA with extensive collaterals. His left main, circumflex and intermediate branches were all normal.  He is on medical management and has not had any anginal symptoms.  He will continue on atorvastatin 20 mg daily and beta-blocker.  He is not on aspirin therapy due to recurrent subdural hematomas.  We will check an FLP and ALT in 1 week.  5.  Chronic systolic CHF -he does not appear volume overloaded on exam today.  He will continue on  beta-blocker therapy.  He has not required any Lasix therapy.  His wife tells me that he has not been real compliant with her low-sodium diet.  I explained to him that this increases the risk of CHF exacerbations.  I recommend he follow less than 2 g sodium diet and weigh himself daily.  I have instructed him to call if he gains more than 2 pounds in a day or 5 pounds in a week.  echo 05/13/2018 showed an EF of 45 to 50% with apical hypokinesis.  6.  Aortic stenosis -echo showed possible bicuspid aortic valve with mild aortic stenosis with a mean gradient of 6 mmHg on 05/30/2018.  7.  Pulmonary HTN -moderate by echo 05/30/2018 with a PASP of 53 mmHg.  Repeat echo 05/31/2019.  8.  OSA - the patient is tolerating PAP therapy well without any problems.   The patient has been using and benefiting from PAP use and will continue to benefit from therapy.  Get a download from the DME.     Medication Adjustments/Labs and Tests Ordered: Current medicines are reviewed at length with the patient today.  Concerns regarding medicines are outlined above.  Orders Placed This Encounter  Procedures  . EKG 12-Lead   No orders of the defined types were placed in this encounter.   Signed, Fransico Him, MD  01/05/2019 11:39 AM    [place]

## 2019-01-05 NOTE — Patient Instructions (Signed)
Medication Instructions:  Your physician recommends that you continue on your current medications as directed. Please refer to the Current Medication list given to you today.  If you need a refill on your cardiac medications before your next appointment, please call your pharmacy.   Lab work: Future: 01/12/19 Fasting labs, Lipid and LFT  If you have labs (blood work) drawn today and your tests are completely normal, you will receive your results only by: Marland Kitchen MyChart Message (if you have MyChart) OR . A paper copy in the mail If you have any lab test that is abnormal or we need to change your treatment, we will call you to review the results.  Testing/Procedures: Your physician has requested that you have an echocardiogram around 05/12/2019. Echocardiography is a painless test that uses sound waves to create images of your heart. It provides your doctor with information about the size and shape of your heart and how well your heart's chambers and valves are working. This procedure takes approximately one hour. There are no restrictions for this procedure.  Follow-Up: At [facility], you and your health needs are our priority.  As part of our continuing mission to provide you with exceptional heart care, we have created designated Provider Care Teams.  These Care Teams include your primary Cardiologist (physician) and Advanced Practice Providers (APPs -  Physician Assistants and Nurse Practitioners) who all work together to provide you with the care you need, when you need it.  Your physician wants you to follow-up in: 6 months with the PA. You will receive a reminder letter in the mail two months in advance. If you don't receive a letter, please call our office to schedule the follow-up appointment.   You will need a follow up appointment in 1 years.  Please call our office 2 months in advance to schedule this appointment.  You may see Fransico Him, MD or one of the following Advanced Practice  Providers on your designated Care Team:   [place], PA-C Melina Copa, PA-C . Ermalinda Barrios, PA-C

## 2019-01-12 ENCOUNTER — Other Ambulatory Visit: Payer: Medicare Other

## 2019-01-12 DIAGNOSIS — E7800 Pure hypercholesterolemia, unspecified: Secondary | ICD-10-CM

## 2019-01-12 LAB — HEPATIC FUNCTION PANEL
ALT: 15 IU/L (ref 0–44)
AST: 19 IU/L (ref 0–40)
Albumin: 4.3 g/dL (ref 3.6–4.6)
Alkaline Phosphatase: 59 IU/L (ref 39–117)
Bilirubin Total: 0.7 mg/dL (ref 0.0–1.2)
Bilirubin, Direct: 0.25 mg/dL (ref 0.00–0.40)
Total Protein: 7.1 g/dL (ref 6.0–8.5)

## 2019-01-12 LAB — LIPID PANEL
Chol/HDL Ratio: 2.9 ratio (ref 0.0–5.0)
Cholesterol, Total: 107 mg/dL (ref 100–199)
HDL: 37 mg/dL — ABNORMAL LOW (ref >39)
LDL Calculated: 51 mg/dL (ref 0–99)
Triglycerides: 93 mg/dL (ref 0–149)
VLDL Cholesterol Cal: 19 mg/dL (ref 5–40)

## 2019-01-13 ENCOUNTER — Telehealth: Payer: Self-pay

## 2019-01-13 NOTE — Telephone Encounter (Signed)
Notes recorded by Frederik Schmidt, RN on 01/13/2019 at 8:00 AM EST The patient has been notified of the result and verbalized understanding. All questions (if any) were answered. Frederik Schmidt, RN 01/13/2019 8:00 AM

## 2019-01-13 NOTE — Telephone Encounter (Signed)
-----   Message from Sueanne Margarita, MD sent at 01/12/2019  6:10 PM EST ----- Stable labs - continue current meds and forward to PCP

## 2019-03-20 IMAGING — CT CT HEAD W/O CM
4 series · 15 of 47 positions shown, 17 images · non-contrast
Comparison: Head CT 01/23/2018

CLINICAL DATA: Subdural hemorrhage

EXAM:
CT HEAD WITHOUT CONTRAST
TECHNIQUE: Contiguous axial images were obtained from the base of the skull
through the vertex without intravenous contrast.

[Series 3: head without · axial · non-contrast · 0.47mm/px · z∈[-177,-57]mm · 6 of 34 slices shown, 8 images]
[im 5/34  brain]
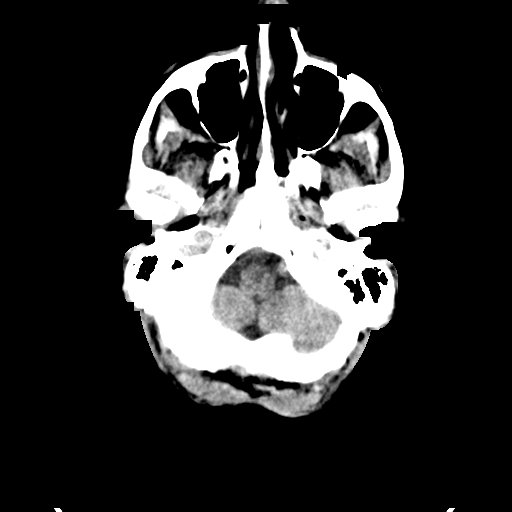
[im 5/34  bone]
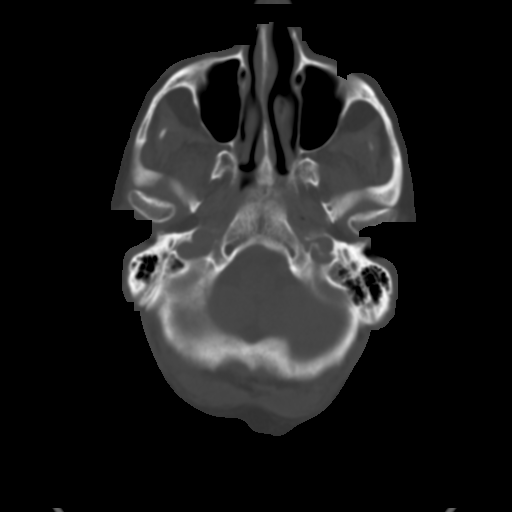
[im 10/34  brain]
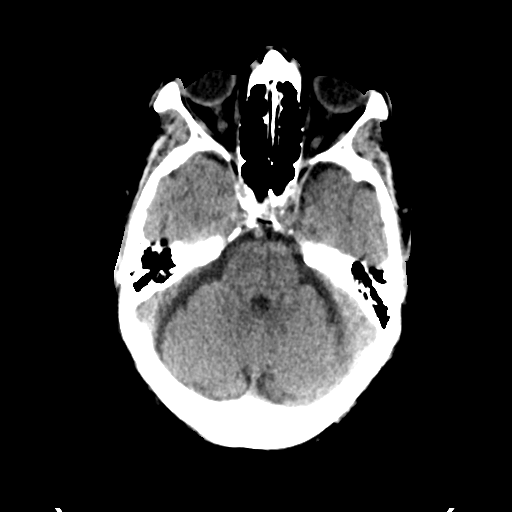
[im 15/34  brain]
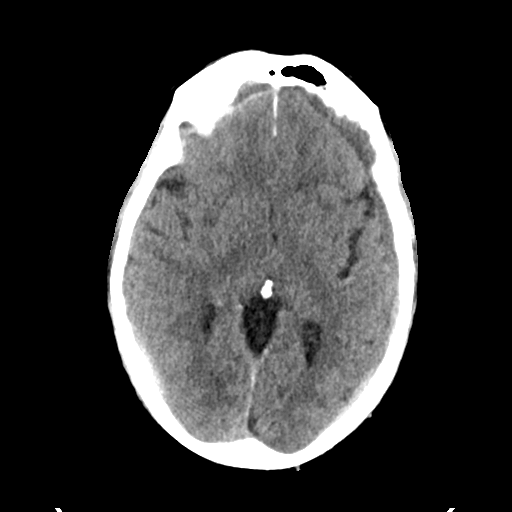
[im 19/34  brain]
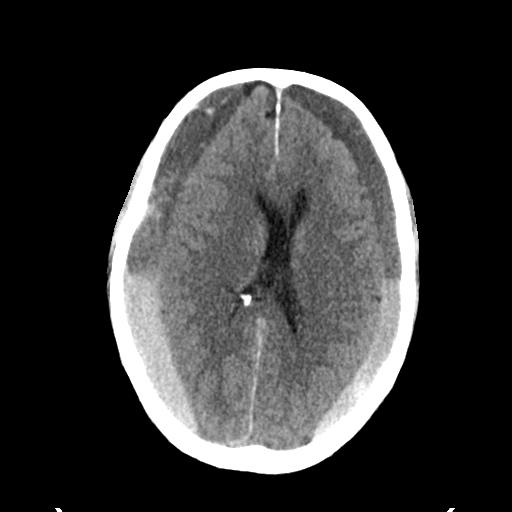
[im 24/34  brain]
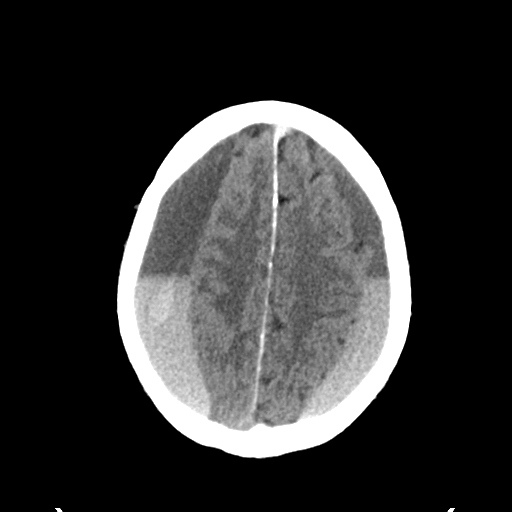
[im 24/34  bone]
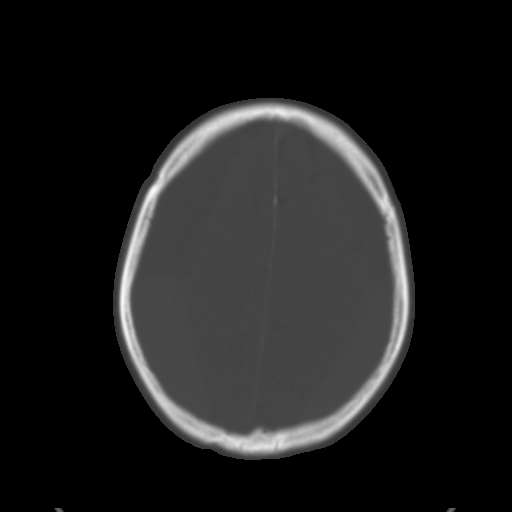
[im 29/34  brain]
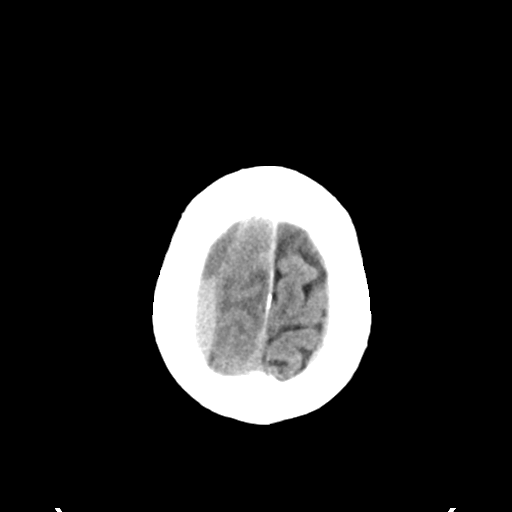

[Series 4: head bone · axial · 0.47mm/px · z∈[-181,-141]mm · 3 of 86 slices shown]
[im 9/86  bone]
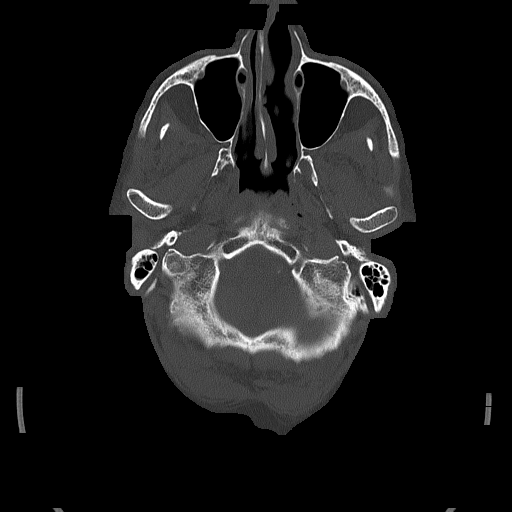
[im 17/86  bone]
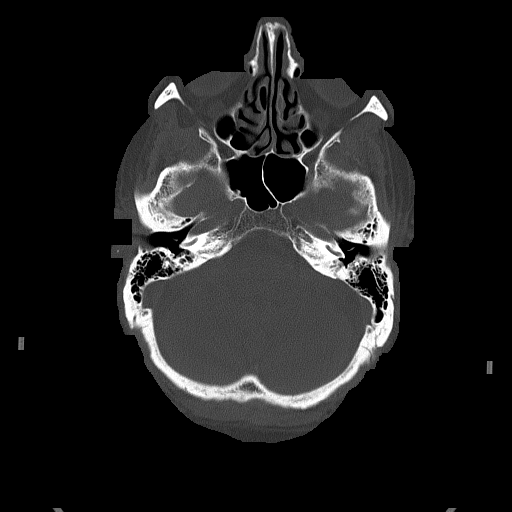
[im 29/86  bone]
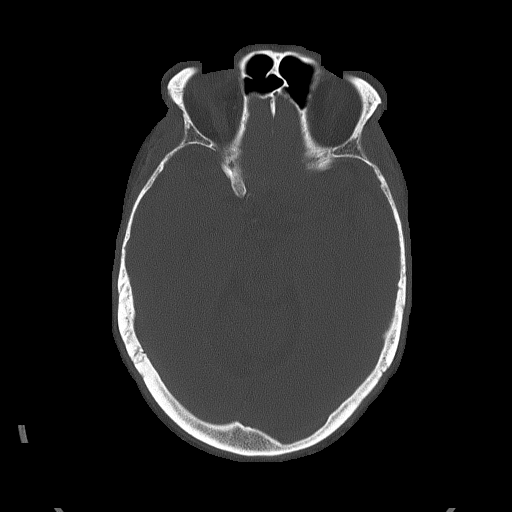

[Series 5: head without cor · coronal · non-contrast · 0.34mm/px · 3 of 69 slices shown]
[im 23/69  brain]
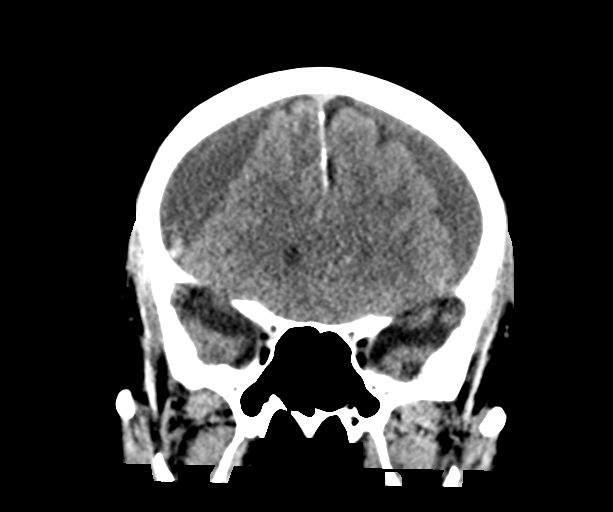
[im 31/69  brain]
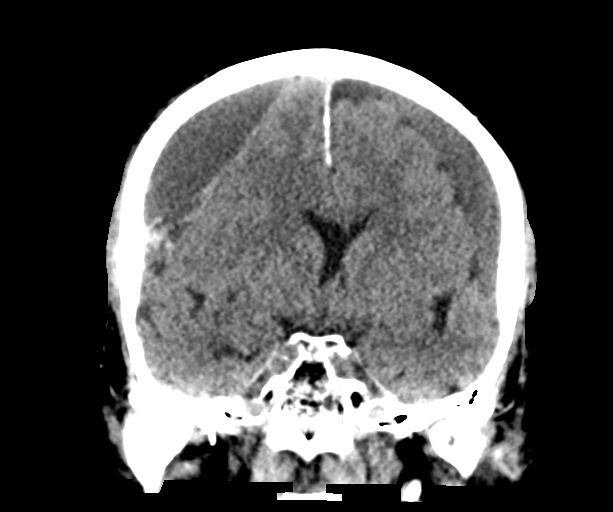
[im 38/69  brain]
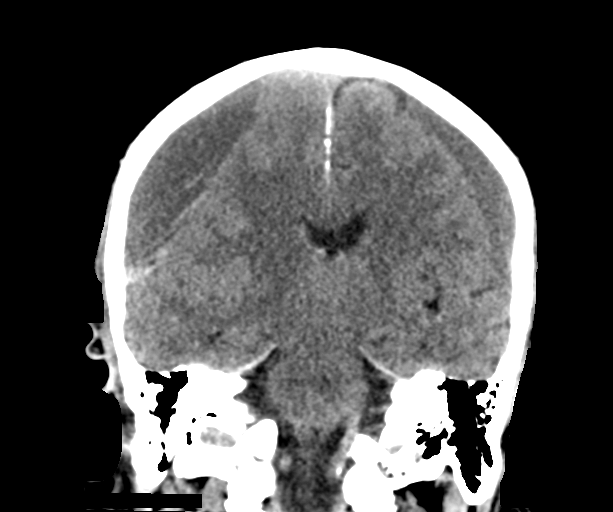

[Series 6: head without sag · sagittal · non-contrast · 0.38mm/px · 3 of 53 slices shown]
[im 18/53  brain]
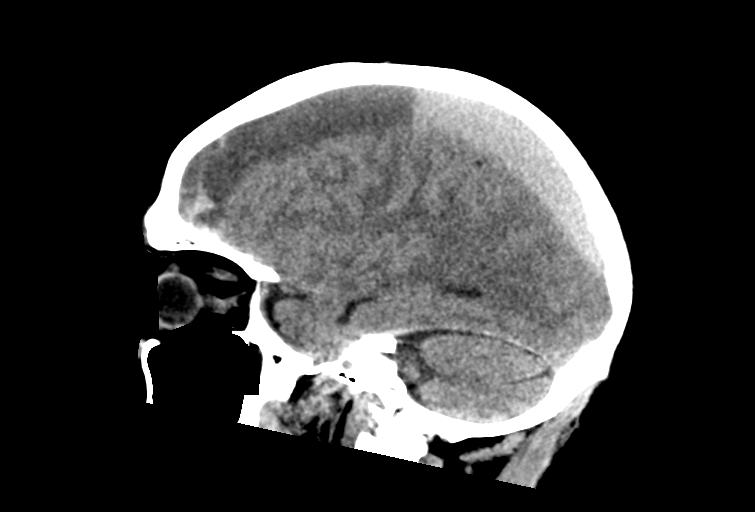
[im 27/53  brain]
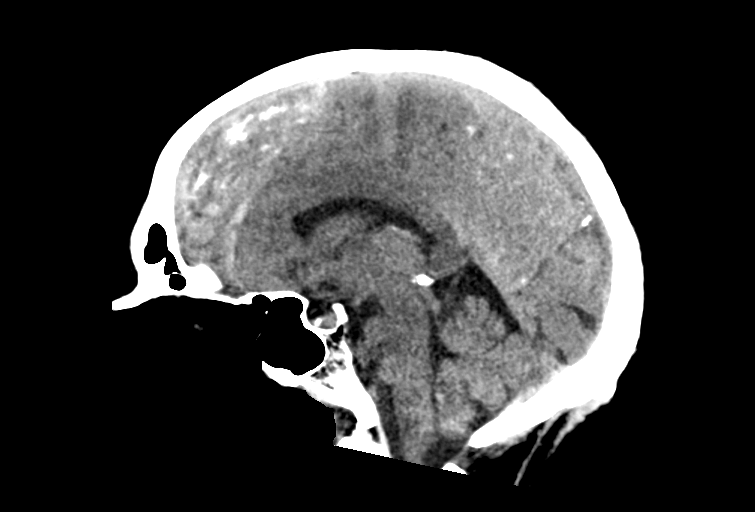
[im 35/53  brain]
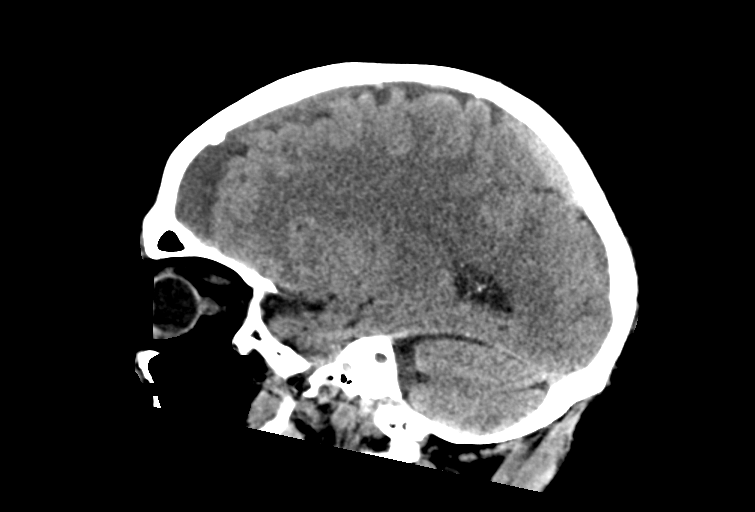

[15 of 47 positions shown; findings below may reference images not displayed]

FINDINGS: Brain: Bilateral mixed attenuation subdural hematomas are unchanged
in size, measuring 22 mm on the right and 13 mm on the left. Mass
effect on the lateral ventricles is unchanged. At the level of the
foramina of [place], there is leftward midline shift of 7 mm. No
intraparenchymal hemorrhage. The size of the temporal horn of the
right lateral ventricle has increased slightly compared to the
previous scan. There is no hydrocephalus.

Vascular: No hyperdense vessel or unexpected vascular calcification.

Skull: Normal visualized skull base, calvarium and extracranial soft
tissues.

Sinuses/Orbits: No sinus fluid levels or advanced mucosal
thickening. No mastoid effusion. Normal orbits.
IMPRESSION: 1. Unchanged size of bilateral acute on chronic subdural hematomata,
right larger than left.
2. Unchanged 7 mm leftward midline shift.
3. Slightly increased size of the temporal horn of the right lateral
ventricle. This may indicate early evidence of ventricular
entrapment. Close attention is recommended on follow-up scans.

## 2019-03-30 ENCOUNTER — Other Ambulatory Visit: Payer: Self-pay | Admitting: Cardiology

## 2019-04-16 ENCOUNTER — Other Ambulatory Visit: Payer: Self-pay | Admitting: Cardiology

## 2019-05-13 ENCOUNTER — Other Ambulatory Visit: Payer: Self-pay

## 2019-05-13 ENCOUNTER — Ambulatory Visit (HOSPITAL_COMMUNITY): Payer: Medicare Other | Attending: Cardiology

## 2019-05-13 DIAGNOSIS — I272 Pulmonary hypertension, unspecified: Secondary | ICD-10-CM | POA: Insufficient documentation

## 2019-05-19 ENCOUNTER — Telehealth: Payer: Self-pay | Admitting: Cardiology

## 2019-05-19 NOTE — Telephone Encounter (Signed)
New Message   Patients wife is calling to obtain echocardiogram results. Please call to discuss.

## 2019-05-19 NOTE — Telephone Encounter (Signed)
Attempted, will call later.  

## 2019-05-20 NOTE — Telephone Encounter (Signed)
Spoke with the patient, he expressed understanding about his echo.

## 2019-07-06 ENCOUNTER — Ambulatory Visit: Payer: Medicare Other | Admitting: Physician Assistant

## 2020-01-01 ENCOUNTER — Other Ambulatory Visit: Payer: Self-pay | Admitting: Cardiology

## 2020-01-10 ENCOUNTER — Encounter: Payer: Self-pay | Admitting: Cardiology

## 2020-01-10 ENCOUNTER — Other Ambulatory Visit: Payer: Self-pay

## 2020-01-10 ENCOUNTER — Ambulatory Visit: Payer: Medicare PPO | Admitting: Cardiology

## 2020-01-10 VITALS — BP 120/70 | HR 57 | Ht 70.0 in | Wt 183.4 lb

## 2020-01-10 DIAGNOSIS — I10 Essential (primary) hypertension: Secondary | ICD-10-CM | POA: Diagnosis not present

## 2020-01-10 DIAGNOSIS — E7800 Pure hypercholesterolemia, unspecified: Secondary | ICD-10-CM | POA: Diagnosis not present

## 2020-01-10 DIAGNOSIS — I272 Pulmonary hypertension, unspecified: Secondary | ICD-10-CM

## 2020-01-10 DIAGNOSIS — I350 Nonrheumatic aortic (valve) stenosis: Secondary | ICD-10-CM

## 2020-01-10 DIAGNOSIS — I4821 Permanent atrial fibrillation: Secondary | ICD-10-CM

## 2020-01-10 DIAGNOSIS — I5022 Chronic systolic (congestive) heart failure: Secondary | ICD-10-CM | POA: Diagnosis not present

## 2020-01-10 DIAGNOSIS — I340 Nonrheumatic mitral (valve) insufficiency: Secondary | ICD-10-CM

## 2020-01-10 DIAGNOSIS — I2510 Atherosclerotic heart disease of native coronary artery without angina pectoris: Secondary | ICD-10-CM | POA: Diagnosis not present

## 2020-01-10 DIAGNOSIS — G4733 Obstructive sleep apnea (adult) (pediatric): Secondary | ICD-10-CM | POA: Diagnosis not present

## 2020-01-10 NOTE — Progress Notes (Signed)
Cardiology Office Note:    Date:  01/10/2020   ID:  Ryan Savage, DOB [birth year], MRN [id number]  PCP:  Lajean Manes, MD  Cardiologist:  Fransico Him, MD    Referring MD: Lajean Manes, MD   Chief Complaint  Patient presents with  . Atrial Fibrillation  . Coronary Artery Disease  . Mitral Regurgitation  . Hypertension  . Congestive Heart Failure  . Aortic Stenosis  . Sleep Apnea  . Hyperlipidemia    History of Present Illness:    Ryan Savage is a 86 y.o. male with a hx of posterior MV leaflet prolapse withmoderateMR,tricuspid valve prolapse with mild to moderate TR, moderate pulmonary hypertension,HTN and chronic atrial fibrillation on chronic anticoagulation.He has a known ischemic dilated cardiomyopathy withEF 30-35% with hypokinesis of the inferior, mid to apical anterior septal, inferior septal and apical anterior as well as apical myocardium.  Cath showed severe two-vessel coronary disease with chronic total occlusions of the LAD and RCA with extensive collaterals. His left main, circumflex and intermediate branches were all normal. He had low right and left-sided intracardiac filling pressures. Mitral regurgitation appeared mild to moderate and there were no significant V waves on a pulmonary capillary wedge tracings. It was felt his mitral regurgitation was not playing a role in his acute exacerbation of CHF.He was started on digoxin, Toprol and Lasix as well as Entresto.  He has a history of bilateral subdural hematomas with 9 mm right to left shift and his INR was reversed with vitamin K and K Centra 01/28/2018. He underwent bilateral bur holes for evacuation of subacute to chronic subdural hematomas. He was discharged home on 3/21/2019and was in rehab. He was then readmitted twice with recurrent bleeds. He is being followed by Neurosurgery. His Entresto was stopped in the hospital due to soft BP.  He is here today for followup and is doing well.  He  denies any chest pain or pressure, SOB, DOE, PND, orthopnea, LE edema, dizziness, palpitations or syncope. He is compliant with his meds and is tolerating meds with no SE.    Past Medical History:  Diagnosis Date  . Aortic stenosis 05/22/2018   Mild with mean AVG 61mmHg by echo 05/2018  . Atrial fibrillation, chronic (HCC)    Not on anticoagulation due to history of bilateral subdural bleeds due to recurrent falls  . BPH (benign prostatic hypertrophy)   . [name] (coronary artery disease), native coronary artery    Severe two-vessel [name] with chronically occluded LAD and RCA with widely patent left main, intermediate, and left circumflex branches.  On medical management.  He has extensive collaterals.  . Chronic systolic heart failure ([place])    Ischemic dilated cardia myopathy with EF 30-35% by echo 11/2017 felt due to a combination of ischemia as well as tachycardia induced from A. fib with RVR.  Repeat echo 05/2018 with EF 45-50%.  . [name] (degenerative joint disease)   . GERD (gastroesophageal reflux disease)   . Hyperlipidemia    LDL goal less then 100  . Hypertension   . Microscopic hematuria    [name]  . Mitral valve prolapse    with mild MR  . OSA (obstructive sleep apnea)   . Pulmonary HTN (HCC)    PASP 89mmHg by echo 05/2018 but normal pressures on right heart cath.  . SDH (subdural hematoma) ([place]) 01/2018  . SIADH (syndrome of inappropriate ADH production) ([place])   . Skin cancer    "side of my nose" (11/19/2017)  . Stroke [facility])  12/2017   "fully recovered"  . Thrombocytopenia (HCC)    Mild- platelet count 143,000 on 02/2011, stable 08/2011    Past Surgical History:  Procedure Laterality Date  . APPENDECTOMY  1950  . BACK SURGERY    . BURR HOLE Bilateral 01/25/2018   Procedure: Haskell Flirt;  Surgeon: Kary Kos, MD;  Location: [place];  Service: Neurosurgery;  Laterality: Bilateral;  . BURR HOLE Left 02/20/2018   Procedure: BURR HOLES for subdural hematoma;  Surgeon: Newman Pies, MD;  Location: [place];  Service: Neurosurgery;  Laterality: Left;  . BURR HOLE Left 03/20/2018   Procedure: Craniotomy for subdural hematoma;  Surgeon: Kristeen Miss, MD;  Location: [place];  Service: Neurosurgery;  Laterality: Left;  . FOREARM FRACTURE SURGERY Right 1954  . LUMBAR DISC SURGERY  1959   ruptured disc repair  . RIGHT/LEFT HEART CATH AND CORONARY ANGIOGRAPHY N/A 11/21/2017   Procedure: RIGHT/LEFT HEART CATH AND CORONARY ANGIOGRAPHY;  Surgeon: Sherren Mocha, MD;  Location: [facility];  Service: Cardiovascular;  Laterality: N/A;  . SKIN CANCER EXCISION Left    "side of my nose"    Current Medications: Current Meds  Medication Sig  . acetaminophen (TYLENOL) 325 MG tablet Take 1-2 tablets (325-650 mg total) by mouth every 4 (four) hours as needed for mild pain.  Marland Kitchen amLODipine (NORVASC) 2.5 MG tablet 2.5 mg daily.   . digoxin (LANOXIN) 0.125 MG tablet TAKE 1 TABLET BY MOUTH  DAILY  . finasteride (PROSCAR) 5 MG tablet Take 1 tablet (5 mg total) by mouth daily.  . Melatonin 3 MG TABS Take by mouth.  . metoprolol succinate (TOPROL-XL) 25 MG 24 hr tablet TAKE 1 TABLET BY MOUTH  DAILY  . Multiple Vitamin (MULTIVITAMIN WITH MINERALS) TABS tablet Take 1 tablet by mouth daily.  . simvastatin (ZOCOR) 10 MG tablet Take 1 tablet by mouth daily.     Allergies:   Methylphenidate derivatives and Novocain [procaine]   Social History   Socioeconomic History  . Marital status: Married    Spouse name: Not on file  . Number of children: Not on file  . Years of education: Not on file  . Highest education level: Not on file  Occupational History  . Occupation: retired  Tobacco Use  . Smoking status: Former Smoker    Years: 3.00    Types: Cigarettes    Quit date: 1958    Years since quitting: 63.2  . Smokeless tobacco: Never Used  Substance and Sexual Activity  . Alcohol use: No  . Drug use: No  . Sexual activity: Not on file  Other Topics Concern  . Not on file   Social History Narrative  . Not on file   Social Determinants of Health   Financial Resource Strain:   . Difficulty of Paying Living Expenses: Not on file  Food Insecurity:   . Worried About Charity fundraiser in the Last Year: Not on file  . Ran Out of Food in the Last Year: Not on file  Transportation Needs:   . Lack of Transportation (Medical): Not on file  . Lack of Transportation (Non-Medical): Not on file  Physical Activity:   . Days of Exercise per Week: Not on file  . Minutes of Exercise per Session: Not on file  Stress:   . Feeling of Stress : Not on file  Social Connections:   . Frequency of Communication with Friends and Family: Not on file  . Frequency of Social Gatherings with  Friends and Family: Not on file  . Attends Religious Services: Not on file  . Active Member of Clubs or Organizations: Not on file  . Attends Archivist Meetings: Not on file  . Marital Status: Not on file     Family History: The patient's family history includes [name] in his brother and father; Colon cancer in his brother and sister; Heart attack in his brother and father; Heart failure in his mother.  ROS:   Please see the history of present illness.    ROS  All other systems reviewed and negative.   EKGs/Labs/Other Studies Reviewed:    The following studies were reviewed today: none  EKG:  EKG is  ordered today.  The ekg ordered today demonstrates atrial fibrillation with CVR and no ST change  Recent Labs: 01/12/2019: ALT 15   Recent Lipid Panel    Component Value Date/Time   CHOL 107 01/12/2019 0832   TRIG 93 01/12/2019 0832   HDL 37 (L) 01/12/2019 0832   CHOLHDL 2.9 01/12/2019 0832   CHOLHDL 3.0 12/09/2017 0513   VLDL 15 12/09/2017 0513   LDLCALC 51 01/12/2019 0832    Physical Exam:    VS:  BP 120/70   Pulse (!) 57   Ht 5\' 10"  (1.778 m)   Wt 183 lb 6.4 oz (83.2 kg)   SpO2 99%   BMI 26.32 kg/m     Wt Readings from Last 3 Encounters:  01/10/20 183 lb  6.4 oz (83.2 kg)  01/05/19 183 lb (83 kg)  05/22/18 175 lb (79.4 kg)     GEN:  Well nourished, well developed in no acute distress HEENT: Normal NECK: No JVD; No carotid bruits LYMPHATICS: No lymphadenopathy CARDIAC: RRR, no murmurs, rubs, gallops RESPIRATORY:  Clear to auscultation without rales, wheezing or rhonchi  ABDOMEN: Soft, non-tender, non-distended MUSCULOSKELETAL:  No edema; No deformity  SKIN: Warm and dry NEUROLOGIC:  Alert and oriented x 3 PSYCHIATRIC:  Normal affect   ASSESSMENT:    1. Permanent atrial fibrillation ([place])   2. Benign essential HTN   3. Nonrheumatic mitral valve regurgitation   4. Coronary artery disease involving native coronary artery of native heart without angina pectoris   5. Chronic systolic CHF (congestive heart failure), NYHA class 2 ([place])   6. Nonrheumatic aortic valve stenosis   7. Pulmonary HTN ([place])   8. OSA (obstructive sleep apnea)   9. Pure hypercholesterolemia    PLAN:    In order of problems listed above:  1.  Permanent atrial fibrillation  -HR is well controlled on exam today -continue digoxin 0.125mg  daily and Toprol XL 25mg  daily -check a dig level -not a candidate for anticoagulation due to hx of multiple subdural bleeds  2.  HTN  -BP well controlled on exam -continue amlodipine 2.5mg  daily  3.  Mitral regurgitation  -echo 05/2019 showed mildly thickened MV leaflets with mild MR  4.  ASCAD - -severe 2 vessel [name] with chronic total occlusions of the LAD and RCA with extensive collaterals. His left main, circumflex and intermediate branches were all normal.   -he denies any anginal sx -continue statin and BB -no ASA due to hx of subdural bleeds  5.  Chronic systolic CHF  -2D echo 0000000 showed EF 45-50% -he appears euvolemic on exam -denies any SOB or LE edema -continue Toprol -had not required diuretics recently -continue with low Na diet  6.  Aortic stenosis  -2D echo 05/2019 showed severe calcification  of the AV  but no AS  7.  Pulmonary HTN  -moderate by echo 05/2019 with a PASP of 42 mmHg.   -repeat echo 05/2020  8. OSA  -The patient is tolerating PAP therapy well without any problems. -The patient has been using and benefiting from PAP use and will continue to benefit from therapy.  -I will get a download from his DME  9.  HLD -LDL goal < 70 -LDL was reviewed on KPN by PCP and was 38 in July 2020 -continue simvastatin 10mg  daily     Medication Adjustments/Labs and Tests Ordered: Current medicines are reviewed at length with the patient today.  Concerns regarding medicines are outlined above.  Orders Placed This Encounter  Procedures  . EKG 12-Lead   No orders of the defined types were placed in this encounter.   Signed, Fransico Him, MD  01/10/2020 2:06 PM    [place]

## 2020-01-10 NOTE — Patient Instructions (Signed)
Medication Instructions:  Your physician recommends that you continue on your current medications as directed. Please refer to the Current Medication list given to you today.  *If you need a refill on your cardiac medications before your next appointment, please call your pharmacy*   Lab Work: TODAY: digoxin  If you have labs (blood work) drawn today and your tests are completely normal, you will receive your results only by: Marland Kitchen MyChart Message (if you have MyChart) OR . A paper copy in the mail If you have any lab test that is abnormal or we need to change your treatment, we will call you to review the results.   Testing/Procedures: Your physician has requested that you have an echocardiogram. Echocardiography is a painless test that uses sound waves to create images of your heart. It provides your doctor with information about the size and shape of your heart and how well your heart's chambers and valves are working. This procedure takes approximately one hour. There are no restrictions for this procedure.  Follow-Up: At [facility], you and your health needs are our priority.  As part of our continuing mission to provide you with exceptional heart care, we have created designated Provider Care Teams.  These Care Teams include your primary Cardiologist (physician) and Advanced Practice Providers (APPs -  Physician Assistants and Nurse Practitioners) who all work together to provide you with the care you need, when you need it.  We recommend signing up for the patient portal called "MyChart".  Sign up information is provided on this After Visit Summary.  MyChart is used to connect with patients for Virtual Visits (Telemedicine).  Patients are able to view lab/test results, encounter notes, upcoming appointments, etc.  Non-urgent messages can be sent to your provider as well.   To learn more about what you can do with MyChart, go to [email or web address].    Your next appointment:   1  year(s)  The format for your next appointment:   Either In Person or Virtual  Provider:   Fransico Him, MD

## 2020-01-11 LAB — DIGOXIN LEVEL: Digoxin, Serum: 0.7 ng/mL (ref 0.5–0.9)

## 2020-01-26 ENCOUNTER — Other Ambulatory Visit (HOSPITAL_COMMUNITY): Payer: Medicare PPO

## 2020-02-07 DIAGNOSIS — I4891 Unspecified atrial fibrillation: Secondary | ICD-10-CM | POA: Diagnosis not present

## 2020-02-07 DIAGNOSIS — I10 Essential (primary) hypertension: Secondary | ICD-10-CM | POA: Diagnosis not present

## 2020-05-05 DIAGNOSIS — G4733 Obstructive sleep apnea (adult) (pediatric): Secondary | ICD-10-CM | POA: Diagnosis not present

## 2020-05-11 DIAGNOSIS — Z85828 Personal history of other malignant neoplasm of skin: Secondary | ICD-10-CM | POA: Diagnosis not present

## 2020-05-11 DIAGNOSIS — L570 Actinic keratosis: Secondary | ICD-10-CM | POA: Diagnosis not present

## 2020-05-11 DIAGNOSIS — L821 Other seborrheic keratosis: Secondary | ICD-10-CM | POA: Diagnosis not present

## 2020-05-11 DIAGNOSIS — L814 Other melanin hyperpigmentation: Secondary | ICD-10-CM | POA: Diagnosis not present

## 2020-05-17 ENCOUNTER — Ambulatory Visit (HOSPITAL_COMMUNITY): Payer: Medicare PPO | Attending: Cardiology

## 2020-05-17 ENCOUNTER — Other Ambulatory Visit: Payer: Self-pay

## 2020-05-17 DIAGNOSIS — I272 Pulmonary hypertension, unspecified: Secondary | ICD-10-CM

## 2020-06-05 DIAGNOSIS — E7800 Pure hypercholesterolemia, unspecified: Secondary | ICD-10-CM | POA: Diagnosis not present

## 2020-06-05 DIAGNOSIS — Z1389 Encounter for screening for other disorder: Secondary | ICD-10-CM | POA: Diagnosis not present

## 2020-06-05 DIAGNOSIS — E222 Syndrome of inappropriate secretion of antidiuretic hormone: Secondary | ICD-10-CM | POA: Diagnosis not present

## 2020-06-05 DIAGNOSIS — Z0000 Encounter for general adult medical examination without abnormal findings: Secondary | ICD-10-CM | POA: Diagnosis not present

## 2020-06-05 DIAGNOSIS — I700 Atherosclerosis of aorta: Secondary | ICD-10-CM | POA: Diagnosis not present

## 2020-06-05 DIAGNOSIS — I5022 Chronic systolic (congestive) heart failure: Secondary | ICD-10-CM | POA: Diagnosis not present

## 2020-06-05 DIAGNOSIS — I10 Essential (primary) hypertension: Secondary | ICD-10-CM | POA: Diagnosis not present

## 2020-06-05 DIAGNOSIS — Z79899 Other long term (current) drug therapy: Secondary | ICD-10-CM | POA: Diagnosis not present

## 2020-06-05 DIAGNOSIS — G4733 Obstructive sleep apnea (adult) (pediatric): Secondary | ICD-10-CM | POA: Diagnosis not present

## 2020-06-05 DIAGNOSIS — I4891 Unspecified atrial fibrillation: Secondary | ICD-10-CM | POA: Diagnosis not present

## 2020-07-07 DIAGNOSIS — L723 Sebaceous cyst: Secondary | ICD-10-CM | POA: Diagnosis not present

## 2020-07-07 DIAGNOSIS — I10 Essential (primary) hypertension: Secondary | ICD-10-CM | POA: Diagnosis not present

## 2020-07-07 DIAGNOSIS — G459 Transient cerebral ischemic attack, unspecified: Secondary | ICD-10-CM | POA: Diagnosis not present

## 2020-07-07 DIAGNOSIS — H903 Sensorineural hearing loss, bilateral: Secondary | ICD-10-CM | POA: Diagnosis not present

## 2020-10-02 DIAGNOSIS — I10 Essential (primary) hypertension: Secondary | ICD-10-CM | POA: Diagnosis not present

## 2021-03-09 ENCOUNTER — Encounter (HOSPITAL_COMMUNITY): Payer: Self-pay

## 2021-03-09 ENCOUNTER — Other Ambulatory Visit: Payer: Self-pay

## 2021-03-09 ENCOUNTER — Emergency Department (HOSPITAL_COMMUNITY): Payer: Medicare PPO

## 2021-03-09 ENCOUNTER — Observation Stay (HOSPITAL_COMMUNITY): Payer: Medicare PPO

## 2021-03-09 ENCOUNTER — Observation Stay (HOSPITAL_COMMUNITY)
Admission: EM | Admit: 2021-03-09 | Discharge: 2021-03-10 | Disposition: A | Discharge status: Home / Self Care | Payer: Medicare PPO | Attending: Emergency Medicine | Admitting: Emergency Medicine

## 2021-03-09 DIAGNOSIS — R4781 Slurred speech: Secondary | ICD-10-CM | POA: Insufficient documentation

## 2021-03-09 DIAGNOSIS — R2681 Unsteadiness on feet: Secondary | ICD-10-CM | POA: Diagnosis not present

## 2021-03-09 DIAGNOSIS — Y900 Blood alcohol level of less than 20 mg/100 ml: Secondary | ICD-10-CM | POA: Insufficient documentation

## 2021-03-09 DIAGNOSIS — I639 Cerebral infarction, unspecified: Secondary | ICD-10-CM | POA: Diagnosis not present

## 2021-03-09 DIAGNOSIS — I4821 Permanent atrial fibrillation: Secondary | ICD-10-CM | POA: Diagnosis present

## 2021-03-09 DIAGNOSIS — I110 Hypertensive heart disease with heart failure: Secondary | ICD-10-CM | POA: Diagnosis not present

## 2021-03-09 DIAGNOSIS — Z853 Personal history of malignant neoplasm of breast: Secondary | ICD-10-CM | POA: Insufficient documentation

## 2021-03-09 DIAGNOSIS — I10 Essential (primary) hypertension: Secondary | ICD-10-CM

## 2021-03-09 DIAGNOSIS — I5022 Chronic systolic (congestive) heart failure: Secondary | ICD-10-CM | POA: Diagnosis not present

## 2021-03-09 DIAGNOSIS — Z20822 Contact with and (suspected) exposure to covid-19: Secondary | ICD-10-CM | POA: Insufficient documentation

## 2021-03-09 DIAGNOSIS — I4891 Unspecified atrial fibrillation: Secondary | ICD-10-CM | POA: Diagnosis not present

## 2021-03-09 DIAGNOSIS — Z87891 Personal history of nicotine dependence: Secondary | ICD-10-CM | POA: Insufficient documentation

## 2021-03-09 DIAGNOSIS — E785 Hyperlipidemia, unspecified: Secondary | ICD-10-CM | POA: Diagnosis present

## 2021-03-09 DIAGNOSIS — R479 Unspecified speech disturbances: Secondary | ICD-10-CM | POA: Diagnosis present

## 2021-03-09 DIAGNOSIS — I2510 Atherosclerotic heart disease of native coronary artery without angina pectoris: Secondary | ICD-10-CM | POA: Diagnosis present

## 2021-03-09 DIAGNOSIS — G4733 Obstructive sleep apnea (adult) (pediatric): Secondary | ICD-10-CM | POA: Diagnosis present

## 2021-03-09 DIAGNOSIS — G319 Degenerative disease of nervous system, unspecified: Secondary | ICD-10-CM | POA: Diagnosis not present

## 2021-03-09 DIAGNOSIS — I63512 Cerebral infarction due to unspecified occlusion or stenosis of left middle cerebral artery: Secondary | ICD-10-CM | POA: Diagnosis not present

## 2021-03-09 DIAGNOSIS — Z79899 Other long term (current) drug therapy: Secondary | ICD-10-CM | POA: Diagnosis not present

## 2021-03-09 LAB — RESP PANEL BY RT-PCR (FLU A&B, COVID) ARPGX2
Influenza A by PCR: NEGATIVE
Influenza B by PCR: NEGATIVE
SARS Coronavirus 2 by RT PCR: NEGATIVE

## 2021-03-09 LAB — URINALYSIS, ROUTINE W REFLEX MICROSCOPIC
Bilirubin Urine: NEGATIVE
Glucose, UA: NEGATIVE mg/dL
Ketones, ur: NEGATIVE mg/dL
Nitrite: NEGATIVE
Protein, ur: 30 mg/dL — AB
Specific Gravity, Urine: 1.006 (ref 1.005–1.030)
pH: 9 — ABNORMAL HIGH (ref 5.0–8.0)

## 2021-03-09 LAB — COMPREHENSIVE METABOLIC PANEL
ALT: 17 U/L (ref 0–44)
AST: 26 U/L (ref 15–41)
Albumin: 4.2 g/dL (ref 3.5–5.0)
Alkaline Phosphatase: 50 U/L (ref 38–126)
Anion gap: 8 (ref 5–15)
BUN: 16 mg/dL (ref 8–23)
CO2: 29 mmol/L (ref 22–32)
Calcium: 9.3 mg/dL (ref 8.9–10.3)
Chloride: 93 mmol/L — ABNORMAL LOW (ref 98–111)
Creatinine, Ser: 0.93 mg/dL (ref 0.61–1.24)
GFR, Estimated: >60 mL/min (ref >60)
Glucose, Bld: 104 mg/dL — ABNORMAL HIGH (ref 70–99)
Potassium: 4.3 mmol/L (ref 3.5–5.1)
Sodium: 130 mmol/L — ABNORMAL LOW (ref 135–145)
Total Bilirubin: 1 mg/dL (ref 0.3–1.2)
Total Protein: 8 g/dL (ref 6.5–8.1)

## 2021-03-09 LAB — RAPID URINE DRUG SCREEN, HOSP PERFORMED
Amphetamines: NOT DETECTED
Barbiturates: NOT DETECTED
Benzodiazepines: NOT DETECTED
Cocaine: NOT DETECTED
Opiates: NOT DETECTED
Tetrahydrocannabinol: NOT DETECTED

## 2021-03-09 LAB — APTT: aPTT: 28 seconds (ref 24–36)

## 2021-03-09 LAB — ETHANOL: Alcohol, Ethyl (B): <10 mg/dL (ref <10)

## 2021-03-09 LAB — DIFFERENTIAL
Abs Immature Granulocytes: 0.02 10*3/uL (ref 0.00–0.07)
Basophils Absolute: 0.1 10*3/uL (ref 0.0–0.1)
Basophils Relative: 1 %
Eosinophils Absolute: 0 10*3/uL (ref 0.0–0.5)
Eosinophils Relative: 1 %
Immature Granulocytes: 0 %
Lymphocytes Relative: 38 %
Lymphs Abs: 2.6 10*3/uL (ref 0.7–4.0)
Monocytes Absolute: 0.6 10*3/uL (ref 0.1–1.0)
Monocytes Relative: 9 %
Neutro Abs: 3.6 10*3/uL (ref 1.7–7.7)
Neutrophils Relative %: 51 %

## 2021-03-09 LAB — CBC
HCT: 41.2 % (ref 39.0–52.0)
Hemoglobin: 13.9 g/dL (ref 13.0–17.0)
MCH: 31.5 pg (ref 26.0–34.0)
MCHC: 33.7 g/dL (ref 30.0–36.0)
MCV: 93.4 fL (ref 80.0–100.0)
Platelets: 150 10*3/uL (ref 150–400)
RBC: 4.41 MIL/uL (ref 4.22–5.81)
RDW: 12.5 % (ref 11.5–15.5)
WBC: 6.8 10*3/uL (ref 4.0–10.5)
nRBC: 0 % (ref 0.0–0.2)

## 2021-03-09 LAB — PROTIME-INR
INR: 1.1 (ref 0.8–1.2)
Prothrombin Time: 14.6 seconds (ref 11.4–15.2)

## 2021-03-09 MED ORDER — ACETAMINOPHEN 160 MG/5ML PO SOLN: 650.0000 mg | ORAL | Status: DC | PRN

## 2021-03-09 MED ORDER — ACETAMINOPHEN 325 MG PO TABS: 650.0000 mg | ORAL_TABLET | ORAL | Status: DC | PRN

## 2021-03-09 MED ORDER — ASPIRIN EC 325 MG PO TBEC
325.0000 mg | DELAYED_RELEASE_TABLET | Freq: Once | ORAL | Status: AC
  Administered 2021-03-09: 325 mg via ORAL
  Filled 2021-03-09: qty 1

## 2021-03-09 MED ORDER — METOPROLOL SUCCINATE ER 25 MG PO TB24: 25.0000 mg | ORAL_TABLET | Freq: Every day | ORAL | Status: DC

## 2021-03-09 MED ORDER — STROKE: EARLY STAGES OF RECOVERY BOOK
Freq: Once | Status: AC
  Filled 2021-03-09: qty 1

## 2021-03-09 MED ORDER — SIMVASTATIN 20 MG PO TABS: 10.0000 mg | ORAL_TABLET | Freq: Every evening | ORAL | Status: DC

## 2021-03-09 MED ORDER — ADULT MULTIVITAMIN W/MINERALS CH
1.0000 | ORAL_TABLET | Freq: Every day | ORAL | Status: DC
  Administered 2021-03-10: 1 via ORAL
  Filled 2021-03-09: qty 1

## 2021-03-09 MED ORDER — SENNOSIDES-DOCUSATE SODIUM 8.6-50 MG PO TABS: 1.0000 | ORAL_TABLET | Freq: Every evening | ORAL | Status: DC | PRN

## 2021-03-09 MED ORDER — ACETAMINOPHEN 650 MG RE SUPP: 650.0000 mg | RECTAL | Status: DC | PRN

## 2021-03-09 MED ORDER — FINASTERIDE 5 MG PO TABS
5.0000 mg | ORAL_TABLET | Freq: Every day | ORAL | Status: DC
  Administered 2021-03-10: 5 mg via ORAL
  Filled 2021-03-09: qty 1

## 2021-03-09 MED ORDER — DIGOXIN 125 MCG PO TABS
0.1250 mg | ORAL_TABLET | Freq: Every day | ORAL | Status: DC
  Filled 2021-03-09: qty 1

## 2021-03-09 NOTE — ED Provider Notes (Signed)
[facility] EMERGENCY DEPARTMENT Provider Note   CSN: [id number] Arrival date & time: 03/09/21  1508     History Chief Complaint  Patient presents with  . possible stroke    Ryan Savage is a 87 y.o. male.  Pt presents to the ED today with a possible stroke.  Pt went to bed last night at 11 pm.  He woke up around 0300 and felt like there was something wrong, but did not know what.  This morning, his wife and his son could not understand what he was saying.  Pt denies any weakness.  He has been able to eat.  No vision changes.  The pt finally agreed to come to the hospital after talking to his pcp.  Pt does have a hx of afib, but is not on blood thinners due to a hx of SDH and falls.  He feels like his is nearly back to normal.        Past Medical History:  Diagnosis Date  . Aortic stenosis 05/22/2018   Mild with mean AVG 23mmHg by echo 05/2018  . Atrial fibrillation, chronic (HCC)    Not on anticoagulation due to history of bilateral subdural bleeds due to recurrent falls  . BPH (benign prostatic hypertrophy)   . [name] (coronary artery disease), native coronary artery    Severe two-vessel [name] with chronically occluded LAD and RCA with widely patent left main, intermediate, and left circumflex branches.  On medical management.  He has extensive collaterals.  . 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 fib with RVR.  Repeat echo 05/2018 with EF 45-50%.  . [name] (degenerative joint disease)   . GERD (gastroesophageal reflux disease)   . Hyperlipidemia    LDL goal less then 100  . Hypertension   . Microscopic hematuria    [name]  . Mitral valve prolapse    with mild MR  . OSA (obstructive sleep apnea)   . Pulmonary HTN (HCC)    PASP 53mmHg by echo 05/2018 but normal pressures on right heart cath.  . SDH (subdural  hematoma) ([place]) 01/2018  . SIADH (syndrome of inappropriate ADH production) ([place])   . Skin cancer    "side of my nose" (11/19/2017)  . Stroke ([place]) 12/2017   "fully recovered"  . Thrombocytopenia (HCC)    Mild- platelet count 143,000 on 02/2011, stable 08/2011    Patient Active Problem List   Diagnosis Date Noted  . Acute CVA (cerebrovascular accident) ([place]) 03/09/2021  . Aortic stenosis 05/22/2018  . Pulmonary HTN ([place]) 05/22/2018  . Subdural hemorrhage ([place]) 03/25/2018  . Subdural hematoma without coma ([place])   . Seizure prophylaxis   . Insomnia   . Thrombocytopenia ([place])   . History of subdural hematoma   . Tachypnea   . 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 Protein-calorie malnutrition, severe 03/20/2018  . Traumatic subdural hematoma ([place]) 02/24/2018  . Acute blood loss anemia   . Steroid-induced hyperglycemia   . 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 Chronic systolic CHF (congestive heart failure), NYHA class 2 ([place]) 02/20/2018  . Subdural hematoma ([place]) 02/20/2018  . SDH (subdural hematoma) ([place]) 01/25/2018  . Bilateral subdural hematomas ([place]) 01/23/2018  . Malnutrition of moderate degree 12/10/2017  . Cerebral embolism with cerebral infarction 12/09/2017  . Odynophagia 12/08/2017  . Syncope and collapse  12/08/2017  . Hyperlipidemia 12/08/2017  . OSA (obstructive sleep apnea) 12/08/2017  . Dysarthria 12/08/2017  . [name] (coronary artery disease) 12/08/2017  . Syndrome of inappropriate ADH (SIADH) secretion ([place]) 12/08/2017  . SIADH (syndrome of inappropriate ADH production) ([place])   . Subclinical hyperthyroidism   . Hyponatremia with excess extracellular fluid volume 11/18/2017  . Hyperglycemia 11/18/2017  . Supratherapeutic INR 11/18/2017  . Mitral valve prolapse   . Permanent atrial fibrillation ([place]) 11/08/2013  . Benign essential HTN 11/08/2013  . Mitral regurgitation 11/08/2013    Past Surgical History:  Procedure Laterality Date  .  APPENDECTOMY  1950  . BACK SURGERY    . BURR HOLE Bilateral 01/25/2018   Procedure: Haskell Flirt;  Surgeon: Kary Kos, MD;  Location: [facility];  Service: Neurosurgery;  Laterality: Bilateral;  . BURR HOLE Left 02/20/2018   Procedure: BURR HOLES for subdural hematoma;  Surgeon: Newman Pies, MD;  Location: [facility];  Service: Neurosurgery;  Laterality: Left;  . BURR HOLE Left 03/20/2018   Procedure: Craniotomy for subdural hematoma;  Surgeon: Kristeen Miss, MD;  Location: [facility];  Service: Neurosurgery;  Laterality: Left;  . FOREARM FRACTURE SURGERY Right 1954  . LUMBAR DISC SURGERY  1959   ruptured disc repair  . RIGHT/LEFT HEART CATH AND CORONARY ANGIOGRAPHY N/A 11/21/2017   Procedure: RIGHT/LEFT HEART CATH AND CORONARY ANGIOGRAPHY;  Surgeon: Sherren Mocha, MD;  Location: [facility];  Service: Cardiovascular;  Laterality: N/A;  . SKIN CANCER EXCISION Left    "side of my nose"       Family History  Problem Relation Age of Onset  . Heart failure Mother   . Heart attack Father   . [name] Father   . Heart attack Brother   . [name] Brother   . Colon cancer Brother   . Colon cancer Sister     Social History   Tobacco Use  . Smoking status: Former Smoker    Years: 3.00    Types: Cigarettes    Quit date: 1958    Years since quitting: 64.3  . Smokeless tobacco: Never Used  Vaping Use  . Vaping Use: Never used  Substance Use Topics  . Alcohol use: No  . Drug use: No    Home Medications Prior to Admission medications   Medication Sig Start Date End Date Taking? Authorizing Provider  acetaminophen (TYLENOL) 325 MG tablet Take 1-2 tablets (325-650 mg total) by mouth every 4 (four) hours as needed for mild pain. 04/03/18   Love, Ivan Anchors, PA-C  amLODipine (NORVASC) 2.5 MG tablet 2.5 mg daily.  12/08/19   [provider]  digoxin (LANOXIN) 0.125 MG tablet TAKE 1 TABLET BY MOUTH  DAILY 01/03/20   Sueanne Margarita, MD  finasteride (PROSCAR) 5 MG tablet Take 1 tablet (5 mg total)  by mouth daily. 03/05/18   Love, Ivan Anchors, PA-C  Melatonin 3 MG TABS Take by mouth.    [provider]  metoprolol succinate (TOPROL-XL) 25 MG 24 hr tablet TAKE 1 TABLET BY MOUTH  DAILY 01/03/20   Sueanne Margarita, MD  Multiple Vitamin (MULTIVITAMIN WITH MINERALS) TABS tablet Take 1 tablet by mouth daily. 04/04/18   Love, Ivan Anchors, PA-C  simvastatin (ZOCOR) 10 MG tablet Take 1 tablet by mouth daily. 05/19/18   [provider]    Allergies    Methylphenidate derivatives and Novocain [procaine]  Review of Systems   Review of Systems  Neurological: Positive for speech difficulty.  All other systems  reviewed and are negative.   Physical Exam Updated Vital Signs BP (!) 158/78   Pulse 62   Temp (!) 97.5 F (36.4 C) (Oral)   Resp 16   SpO2 99%   Physical Exam Vitals and nursing note reviewed.  Constitutional:      Appearance: Normal appearance.  HENT:     Head: Normocephalic and atraumatic.     Right Ear: External ear normal.     Left Ear: External ear normal.     Nose: Nose normal.     Mouth/Throat:     Mouth: Mucous membranes are moist.     Pharynx: Oropharynx is clear.  Eyes:     Extraocular Movements: Extraocular movements intact.     Conjunctiva/sclera: Conjunctivae normal.     Pupils: Pupils are equal, round, and reactive to light.  Cardiovascular:     Rate and Rhythm: Normal rate. Rhythm irregular.     Pulses: Normal pulses.     Heart sounds: Normal heart sounds.  Pulmonary:     Effort: Pulmonary effort is normal.     Breath sounds: Normal breath sounds.  Abdominal:     General: Abdomen is flat. Bowel sounds are normal.     Palpations: Abdomen is soft.  Musculoskeletal:        General: Normal range of motion.     Cervical back: Normal range of motion and neck supple.  Skin:    General: Skin is warm.     Capillary Refill: Capillary refill takes less than 2 seconds.  Neurological:     General: No focal deficit present.     Mental Status: He is  alert and oriented to person, place, and time.  Psychiatric:        Mood and Affect: Mood normal.        Behavior: Behavior normal.        Thought Content: Thought content normal.        Judgment: Judgment normal.     ED Results / Procedures / Treatments   Labs (all labs ordered are listed, but only abnormal results are displayed) Labs Reviewed  COMPREHENSIVE METABOLIC PANEL - Abnormal; Notable for the following components:      Result Value   Sodium 130 (*)    Chloride 93 (*)    Glucose, Bld 104 (*)    All other components within normal limits  URINALYSIS, ROUTINE W REFLEX MICROSCOPIC - Abnormal; Notable for the following components:   pH 9.0 (*)    Hgb urine dipstick MODERATE (*)    Protein, ur 30 (*)    Leukocytes,Ua TRACE (*)    Bacteria, UA RARE (*)    All other components within normal limits  ETHANOL  PROTIME-INR  APTT  CBC  DIFFERENTIAL  RAPID URINE DRUG SCREEN, HOSP PERFORMED    EKG EKG Interpretation  Date/Time:  Friday March 09 2021 15:26:24 EDT Ventricular Rate:  71 PR Interval:    QRS Duration: 96 QT Interval:  370 QTC Calculation: 402 R Axis:   -12 Text Interpretation: Atrial fibrillation Low voltage QRS Cannot rule out Anterior infarct , age undetermined Abnormal ECG No significant change since last tracing Confirmed by Isla Pence [phone]) on 03/09/2021 5:10:44 PM   Radiology CT Head Wo Contrast  Result Date: 03/09/2021 CLINICAL DATA:  Slurred speech EXAM: CT HEAD WITHOUT CONTRAST TECHNIQUE: Contiguous axial images were obtained from the base of the skull through the vertex without intravenous contrast. COMPARISON:  09/16/2018 FINDINGS: Brain: No evidence of acute infarction, hemorrhage,  extra-axial collection, ventriculomegaly, or mass effect. Left frontal lobe encephalomalacia. Mild encephalomalacia of the right cerebellum. Generalized cerebral atrophy. Periventricular white matter low attenuation likely secondary to microangiopathy. Vascular:  Cerebrovascular atherosclerotic calcifications are noted. Skull: Negative for fracture or focal lesion. Prior left parietal craniotomy. Bilateral frontal and parietal burr holes. Sinuses/Orbits: Visualized portions of the orbits are unremarkable. Visualized portions of the paranasal sinuses are unremarkable. Visualized portions of the mastoid air cells are unremarkable. Other: None. IMPRESSION: 1. No acute intracranial pathology. 2. Chronic microvascular disease and cerebral atrophy. Electronically Signed   By: Kathreen Devoid   On: 03/09/2021 17:52   MR BRAIN WO CONTRAST  Result Date: 03/09/2021 CLINICAL DATA:  Speech disturbance beginning last night. EXAM: MRI HEAD WITHOUT CONTRAST TECHNIQUE: Multiplanar, multiecho pulse sequences of the brain and surrounding structures were obtained without intravenous contrast. COMPARISON:  Head CT earlier same day FINDINGS: Brain: Diffusion imaging shows a very small deep white matter infarction in the left posterior frontal region. Small acute infarction of a gyral surface in the deep insula on the left. No other convincing acute infarction. There are a few areas of susceptibility artifact along the surface of parieto-occipital convexities which simulate cortical ischemia but I think are not real findings. Chronic cerebellar atrophy with old small vessel infarctions. Chronic cerebral atrophy with chronic small-vessel ischemic changes of the white matter and old infarctions in the left frontal operculum region. No mass, hemorrhage, hydrocephalus or extra-axial collection. Vascular: Major vessels at the base of the brain show flow. Skull and upper cervical spine: Negative Sinuses/Orbits: Clear/normal Other: None IMPRESSION: 1. Small acute infarction of a gyral surface in the deep insula on the left. Small deep white matter infarction in the left posterior frontal region. Findings consistent with small micro embolic infarctions. 2. Chronic small-vessel ischemic changes elsewhere  throughout the brain as outlined above. Old cortical and subcortical infarction in the left frontal operculum and anterior insular region. Electronically Signed   By: Nelson Chimes M.D.   On: 03/09/2021 19:22    Procedures Procedures   Medications Ordered in ED Medications  aspirin EC tablet 325 mg (has no administration in time range)    ED Course  I have reviewed the triage vital signs and the nursing notes.  Pertinent labs & imaging results that were available during my care of the patient were reviewed by me and considered in my medical decision making (see chart for details).    MDM Rules/Calculators/A&P                          CHA2DS2/VAS Stroke Risk Points  Current as of 7 minutes ago     7 >= 2 Points: High Risk  1 - 1.99 Points: Medium Risk  0 Points: Low Risk    Last Change: N/A      Details    This score determines the patient's risk of having a stroke if the  patient has atrial fibrillation.       Points Metrics  1 Has Congestive Heart Failure:  Yes    Current as of 7 minutes ago  1 Has Vascular Disease:  Yes    Current as of 7 minutes ago  1 Has Hypertension:  Yes    Current as of 7 minutes ago  2 Age:  37    Current as of 7 minutes ago  0 Has Diabetes:  No    Current as of 7 minutes ago  2 Had Stroke:  Yes  Had TIA:  Yes   Had Thromboembolism:  No    Current as of 7 minutes ago  0 Male:  No    Current as of 7 minutes ago       Pt d/w neurology who recommends asa.  Asa ordered.  He will see pt in consult.  Pt d/w Dr. Posey Pronto (triad) for admission.  CRITICAL CARE Performed by: Isla Pence   Total critical care time: 30 minutes  Critical care time was exclusive of separately billable procedures and treating other patients.  Critical care was necessary to treat or prevent imminent or life-threatening deterioration.  Critical care was time spent personally by me on the following activities: development of treatment plan with patient and/or  surrogate as well as nursing, discussions with consultants, evaluation of patient's response to treatment, examination of patient, obtaining history from patient or surrogate, ordering and performing treatments and interventions, ordering and review of laboratory studies, ordering and review of radiographic studies, pulse oximetry and re-evaluation of patient's condition.   Final Clinical Impression(s) / ED Diagnoses Final diagnoses:  Cerebrovascular accident (CVA), unspecified mechanism ([place])    Rx / [place] Orders ED Discharge Orders    None       Isla Pence, MD 03/09/21 2003

## 2021-03-09 NOTE — ED Triage Notes (Signed)
Emergency Medicine Provider Triage Evaluation Note  Ryan Savage , a 87 y.o. male  was evaluated in triage.  Pt complains of speech changes. LNW was last night at 11pm when he went to bed. Family noted slurred speech today. Denies numbness, weakness, vision changes.  Review of Systems  Positive: Slurred speech Negative: Numbness, weakness, vision changes  Physical Exam  BP (!) 158/106 (BP Location: Right Arm)   Pulse 70   Temp (!) 97.4 F (36.3 C) (Oral)   Resp 17   SpO2 100%  Gen:   Awake, no distress   HEENT:  Atraumatic  Resp:  Normal effort  Cardiac:  Normal rate  Abd:   Nondistended, nontender  MSK:   Moves extremities without difficulty  Neuro:  Speech somewhat slurred, no aphasia, no focal deficits  Medical Decision Making  Medically screening exam initiated at 3:27 PM.  Appropriate orders placed.  [name] was informed that the remainder of the evaluation will be completed by another provider, this initial triage assessment does not replace that evaluation, and the importance of remaining in the ED until their evaluation is complete.  Clinical Impression   Pt not a tpa candidate as he is out of the window. Van negative. Stroke w/u ordered.   MSE was initiated and I personally evaluated the patient and placed orders (if any) at  3:31 PM on March 09, 2021.  The patient appears stable so that the remainder of the MSE may be completed by another provider.    Rodney Booze, [place] 03/09/21 1533

## 2021-03-09 NOTE — ED Notes (Signed)
Pt transported to CT ?

## 2021-03-09 NOTE — ED Notes (Signed)
Patient transported to CT 

## 2021-03-09 NOTE — ED Notes (Signed)
ED Provider at bedside. 

## 2021-03-09 NOTE — Consult Note (Signed)
NEUROLOGY CONSULTATION NOTE   Date of service: March 09, 2021 Patient Name: Ryan Savage MRN:  [id number] DOB:  02/24/1929 Reason for consult: "Stroke" Requesting Provider: Lenore Cordia, MD _ _ _   _ __   _ __ _ _  __ __   _ __   __ _  History of Present Illness  Ryan Savage is a 87 y.o. male with PMH significant for afibb not on [facility] due to prior SDH x 3 in 2019 and falls, hx of BPH, HTN, OSA, GERD, MVP, prior SDH and fall who presents with aphasia. Went to bed at 2300 on 03/08/21. Workup up around 0300 feeling off, he could not go back to sleep. Eventually took his CPAP mask off to get some rest. Family saw him in the morning with slurring of speech. He felt his symptoms were very mild. Does endorse that while talking, he was okay talking in short sentences but had trouble with coming up with words if he was in a long conversation. Symptoms improved over the course of the day. Family eventually able to convince him to come to the ED.  MRS: 0 NIHSS: 1(mild dysarthria) TPA/Thrombeectomy: No, hx of SDH x 3, low NIHSS and low concern for LCO.   ROS   Constitutional Denies weight loss, fever and chills.   HEENT Denies changes in vision and hearing.   Respiratory Denies SOB and cough.   CV Denies palpitations and CP   GI Denies abdominal pain, nausea, vomiting and diarrhea.   GU Denies dysuria and urinary frequency.   MSK Denies myalgia and joint pain.   Skin Denies rash and pruritus.   Neurological Denies headache and syncope.   Psychiatric Denies recent changes in mood. Denies anxiety and depression.    Past History   Past Medical History:  Diagnosis Date  . Aortic stenosis 05/22/2018   Mild with mean AVG 75mmHg by echo 05/2018  . Atrial fibrillation, chronic (HCC)    Not on anticoagulation due to history of bilateral subdural bleeds due to recurrent falls  . BPH (benign prostatic hypertrophy)   . [name] (coronary artery disease), native coronary artery    Severe two-vessel [name]  with chronically occluded LAD and RCA with widely patent left main, intermediate, and left circumflex branches.  On medical management.  He has extensive collaterals.  . 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 fib with RVR.  Repeat echo 05/2018 with EF 45-50%.  . [name] (degenerative joint disease)   . GERD (gastroesophageal reflux disease)   . Hyperlipidemia    LDL goal less then 100  . Hypertension   . Microscopic hematuria    [name]  . Mitral valve prolapse    with mild MR  . OSA (obstructive sleep apnea)   . Pulmonary HTN (HCC)    PASP 60mmHg by echo 05/2018 but normal pressures on right heart cath.  . SDH (subdural hematoma) ([place]) 01/2018  . SIADH (syndrome of inappropriate ADH production) ([place])   . Skin cancer    "side of my nose" (11/19/2017)  . Stroke ([place]) 12/2017   "fully recovered"  . Thrombocytopenia (HCC)    Mild- platelet count 143,000 on 02/2011, stable 08/2011   Past Surgical History:  Procedure Laterality Date  . APPENDECTOMY  1950  . BACK SURGERY    . BURR HOLE Bilateral 01/25/2018  Procedure: Haskell Flirt;  Surgeon: Kary Kos, MD;  Location: [facility];  Service: Neurosurgery;  Laterality: Bilateral;  . BURR HOLE Left 02/20/2018   Procedure: BURR HOLES for subdural hematoma;  Surgeon: Newman Pies, MD;  Location: [place];  Service: Neurosurgery;  Laterality: Left;  . BURR HOLE Left 03/20/2018   Procedure: Craniotomy for subdural hematoma;  Surgeon: Kristeen Miss, MD;  Location: [place];  Service: Neurosurgery;  Laterality: Left;  . FOREARM FRACTURE SURGERY Right 1954  . LUMBAR DISC SURGERY  1959   ruptured disc repair  . RIGHT/LEFT HEART CATH AND CORONARY ANGIOGRAPHY N/A 11/21/2017   Procedure: RIGHT/LEFT HEART CATH AND CORONARY ANGIOGRAPHY;  Surgeon: Sherren Mocha, MD;  Location: [facility];  Service: Cardiovascular;   Laterality: N/A;  . SKIN CANCER EXCISION Left    "side of my nose"   Family History  Problem Relation Age of Onset  . Heart failure Mother   . Heart attack Father   . [name] Father   . Heart attack Brother   . [name] Brother   . Colon cancer Brother   . Colon cancer Sister    Social History   Socioeconomic History  . Marital status: Married    Spouse name: Not on file  . Number of children: Not on file  . Years of education: Not on file  . Highest education level: Not on file  Occupational History  . Occupation: retired  Tobacco Use  . Smoking status: Former Smoker    Years: 3.00    Types: Cigarettes    Quit date: 1958    Years since quitting: 64.3  . Smokeless tobacco: Never Used  Vaping Use  . Vaping Use: Never used  Substance and Sexual Activity  . Alcohol use: No  . Drug use: No  . Sexual activity: Not on file  Other Topics Concern  . Not on file  Social History Narrative  . Not on file   Social Determinants of Health   Financial Resource Strain: Not on file  Food Insecurity: Not on file  Transportation Needs: Not on file  Physical Activity: Not on file  Stress: Not on file  Social Connections: Not on file   Allergies  Allergen Reactions  . Methylphenidate Derivatives Other (See Comments)    Restless leg  . Novocain [Procaine] Rash    Medications  (Not in a hospital admission)    Vitals   Vitals:   03/09/21 1715 03/09/21 1802 03/09/21 1916 03/09/21 2013  BP: (!) 176/98 (!) 151/82 (!) 158/78 (!) 151/80  Pulse: 66 60 62 (!) 56  Resp: 17 14 16  (!) 21  Temp:    97.6 F (36.4 C)  TempSrc:    Oral  SpO2: 98% 98% 99% 99%     There is no height or weight on file to calculate BMI.  Physical Exam   General: Laying comfortably in bed; in no acute distress.  HENT: Normal oropharynx and mucosa. Normal external appearance of ears and nose.  Neck: Supple, no pain or tenderness  CV: No JVD. No peripheral edema.  Pulmonary: Symmetric Chest rise. Normal  respiratory effort.  Abdomen: Soft to touch, non-tender.  Ext: No cyanosis, edema, or deformity  Skin: No rash. Normal palpation of skin.   Musculoskeletal: Normal digits and nails by inspection. No clubbing.   Neurologic Examination  Mental status/Cognition: Alert, oriented to self, place, month and year, good attention.  Speech/language: Mildly dysarthric, Fluent, comprehension intact, object naming intact, repetition intact.  Cranial nerves:  CN II Pupils equal and reactive to light, no VF deficits   CN III,IV,VI EOM intact, no gaze preference or deviation, no nystagmus   CN V normal sensation in V1, V2, and V3 segments bilaterally   CN VII no asymmetry, no nasolabial fold flattening   CN VIII normal hearing to speech   CN IX & X normal palatal elevation, no uvular deviation   CN XI 5/5 head turn and 5/5 shoulder shrug bilaterally   CN XII midline tongue protrusion   Motor:  Muscle bulk:normal, tone normal, pronator drift none tremor none Mvmt Root Nerve  Muscle Right Left Comments  SA C5/6 Ax Deltoid 5 5   EF C5/6 Mc Biceps 5 5   EE C6/7/8 Rad Triceps 5 5   WF C6/7 Med FCR     WE C7/8 PIN ECU     F Ab C8/T1 U ADM/FDI 5 5   HF L1/2/3 Fem Illopsoas 5 5   KE L2/3/4 Fem Quad 5 5   DF L4/5 D Peron Tib Ant 5 5   PF S1/2 Tibial Grc/Sol 5 5    Reflexes:  Right Left Comments  Pectoralis      Biceps (C5/6) 2 2   Brachioradialis (C5/6) 2 2    Triceps (C6/7) 2 2    Patellar (L3/4) 2+ 2+    Achilles (S1) 0 0    Hoffman      Plantar mute mute   Jaw jerk    Sensation:  Light touch intact   Pin prick    Temperature    Vibration   Proprioception    Coordination/Complex Motor:  - Finger to Nose intact BL - Heel to shin with mild BL ataxia. - Rapid alternating movement are normal - Gait: Deferred.  Labs   CBC:  Recent Labs  Lab 03/09/21 1533  WBC 6.8  NEUTROABS 3.6  HGB 13.9  HCT 41.2  MCV 93.4  PLT 841    Basic Metabolic Panel:  Lab Results  Component  Value Date   NA 130 (L) 03/09/2021   K 4.3 03/09/2021   CO2 29 03/09/2021   GLUCOSE 104 (H) 03/09/2021   BUN 16 03/09/2021   CREATININE 0.93 03/09/2021   CALCIUM 9.3 03/09/2021   GFRNONAA >60 03/09/2021   GFRAA >60 04/01/2018   Lipid Panel:  Lab Results  Component Value Date   LDLCALC 51 01/12/2019   HgbA1c:  Lab Results  Component Value Date   HGBA1C 5.8 (H) 12/09/2017   Urine Drug Screen:     Component Value Date/Time   LABOPIA NONE DETECTED 03/09/2021 1605   COCAINSCRNUR NONE DETECTED 03/09/2021 1605   LABBENZ NONE DETECTED 03/09/2021 1605   AMPHETMU NONE DETECTED 03/09/2021 1605   THCU NONE DETECTED 03/09/2021 1605   LABBARB NONE DETECTED 03/09/2021 1605    Alcohol Level     Component Value Date/Time   ETH <10 03/09/2021 1533    CT Head without contrast: CTH was negative for a large hypodensity concerning for a large territory infarct or hyperdensity concerning for an ICH  MR Angio head without contrast and Carotid Duplex BL: pending  MRI Brain:  1. Small acute infarction of a gyral surface in the deep insula on the left. Small deep white matter infarction in the left posterior frontal region. Findings consistent with small micro embolic infarctions. 2. Chronic small-vessel ischemic changes elsewhere throughout the brain as outlined above. Old cortical and subcortical infarction in the left frontal operculum and anterior insular region.  Impression   Ryan Savage is a 87 y.o. male with PMH significant for afibb not on AC or Antiplatelet due to prior hx of SDH x 3 and falls who presents with small acute deep left insular stroke. His neurologic examination is notable for mild dysarthria.  Suspect that this is either cardioembolic or small vessel infarct. On chart review, does appear that he had SDH on warfarin. Not entirely sure if he ever had any SDH on antiplatelet agent thou.  Recommendations  Plan:  - Frequent Neuro checks per stroke unit  protocol - MRI Brain without contrast with small acute deep left insular stroke - Vascular imaging with MRA Angio Head without contrast and US Carotid doppler - I dont think TTE will change much in our management as he unfortunately cant be on AC. - Lipid panel with LDL is pending. He is on simvastatin at home. - HbA1c is pending. - Antithrombotic - Will defer to stroke team in AM to see if this might be something that can be tried to reduce risk of strokes in the setting of Afibb. - Recommend DVT ppx - SBP goal - permissive hypertension first 24 h < 220/110. Held home meds.  - Recommend Telemetry monitoring for arrythmia - Recommend bedside swallow screen prior to PO intake. - Stroke education booklet - Recommend PT/OT/SLP consult  ______________________________________________________________________   Thank you for the opportunity to take part in the care of this patient. If you have any further questions, please contact the neurology consultation attending.  Signed,  [facility] Pager Number [phone] _ _ _   _ __   _ __ _ _  __ __   _ __   __ _

## 2021-03-09 NOTE — H&P (Signed)
History and Physical    Ryan Savage ERX:[id number] DOB: 02-17-1929 DOA: 03/09/2021  PCP: Lajean Manes, MD  Patient coming from: Home  I have personally briefly reviewed patient's old medical records in [place]  Chief Complaint: Abnormal speech  HPI: Ryan Savage is a 87 y.o. male with medical history significant for chronic atrial fibrillation not on anticoagulation due to prior SDH x3 in 2019 and frequent falls, [name], chronic systolic CHF (last EF 95-09% 05/17/2020), history of CVA, HTN, HLD, BPH, and OSA on CPAP who presents to the ED for evaluation of abnormal speech.  Patient states he was in his usual state of health when he went to bed at 2300 on 03/08/2021.  He woke up around 3 AM on 4/29 feeling "off" and took off his CPAP mask to try to get some rest.  In the morning when he awoke he and his family noticed that his speech more slurred and became more pronounced when he was having a long conversation.  He was initially hesitant to seek medical attention however family eventually convinced him to come to the ED for further evaluation.  His symptoms did improve over the course of the day.  He otherwise denies any headache, change in vision, numbness/tingling, facial droop, nausea, vomiting, chest pain, dyspnea, abdominal pain, weakness in his arms or legs.  ED Course:  Initial vitals showed BP 158/106, pulse 70, RR 17, temp 97.4 F, SPO2 100% on room air.  Labs show sodium 130, potassium 4.3, bicarb 29, BUN 16, creatinine 0.93, serum glucose 104, LFTs within normal limits, WBC 6.8, hemoglobin 13.9, platelets 150,000, INR 1.1, serum ethanol undetectable.  Urinalysis shows negative ketones, 30 protein, negative nitrites, trace leukocytes, 11-20 RBC/hpf, 6-10 WBC/hpf, rare bacteria microscopy.  UDS is negative.  SARS-CoV-2 PCR negative.  Influenza A PCR negative.  CT head without contrast negative for acute intracranial pathology.  Chronic microvascular disease and cerebral  atrophy noted.  MRI brain without contrast shows a small acute infarction of the gyral surface in the deep insula on the left.  Small deep white matter infarction in the left posterior frontal region also seen.  Findings consistent with small microembolic infarctions.  Old cortical and subcortical infarctions in the left frontal operculum and anterior insular region also noted.  Patient was given aspirin 325 mg once.  Neurology were consulted and have evaluated the patient.  The hospitalist service was consulted to admit for further evaluation and management.  Review of Systems: All systems reviewed and are negative except as documented in history of present illness above.   Past Medical History:  Diagnosis Date  . Aortic stenosis 05/22/2018   Mild with mean AVG 56mmHg by echo 05/2018  . Atrial fibrillation, chronic (HCC)    Not on anticoagulation due to history of bilateral subdural bleeds due to recurrent falls  . BPH (benign prostatic hypertrophy)   . [name] (coronary artery disease), native coronary artery    Severe two-vessel [name] with chronically occluded LAD and RCA with widely patent left main, intermediate, and left circumflex branches.  On medical management.  He has extensive collaterals.  . 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 fib with RVR.  Repeat echo 05/2018 with EF 45-50%.  . [name] (degenerative joint disease)   . GERD (gastroesophageal reflux disease)   . Hyperlipidemia    LDL goal less then  100  . Hypertension   . Microscopic hematuria    [name]  . Mitral valve prolapse    with mild MR  . OSA (obstructive sleep apnea)   . Pulmonary HTN (HCC)    PASP 63mmHg by echo 05/2018 but normal pressures on right heart cath.  . SDH (subdural hematoma) ([place]) 01/2018  . SIADH (syndrome of inappropriate ADH production) ([place])   . Skin cancer     "side of my nose" (11/19/2017)  . Stroke ([place]) 12/2017   "fully recovered"  . Thrombocytopenia (HCC)    Mild- platelet count 143,000 on 02/2011, stable 08/2011    Past Surgical History:  Procedure Laterality Date  . APPENDECTOMY  1950  . BACK SURGERY    . BURR HOLE Bilateral 01/25/2018   Procedure: Haskell Flirt;  Surgeon: Kary Kos, MD;  Location: [facility];  Service: Neurosurgery;  Laterality: Bilateral;  . BURR HOLE Left 02/20/2018   Procedure: BURR HOLES for subdural hematoma;  Surgeon: Newman Pies, MD;  Location: [facility];  Service: Neurosurgery;  Laterality: Left;  . BURR HOLE Left 03/20/2018   Procedure: Craniotomy for subdural hematoma;  Surgeon: Kristeen Miss, MD;  Location: [facility];  Service: Neurosurgery;  Laterality: Left;  . FOREARM FRACTURE SURGERY Right 1954  . LUMBAR DISC SURGERY  1959   ruptured disc repair  . RIGHT/LEFT HEART CATH AND CORONARY ANGIOGRAPHY N/A 11/21/2017   Procedure: RIGHT/LEFT HEART CATH AND CORONARY ANGIOGRAPHY;  Surgeon: Sherren Mocha, MD;  Location: [facility];  Service: Cardiovascular;  Laterality: N/A;  . SKIN CANCER EXCISION Left    "side of my nose"    Social History:  reports that he quit smoking about 64 years ago. His smoking use included cigarettes. He quit after 3.00 years of use. He has never used smokeless tobacco. He reports that he does not drink alcohol and does not use drugs.  Allergies  Allergen Reactions  . Methylphenidate Derivatives Other (See Comments)    Restless leg  . Warfarin Sodium     Other reaction(s): subdural  . Novocain [Procaine] Rash    Family History  Problem Relation Age of Onset  . Heart failure Mother   . Heart attack Father   . [name] Father   . Heart attack Brother   . [name] Brother   . Colon cancer Brother   . Colon cancer Sister      Prior to Admission medications   Medication Sig Start Date End Date Taking? Authorizing Provider  acetaminophen (TYLENOL) 325 MG tablet Take 1-2 tablets (325-650 mg  total) by mouth every 4 (four) hours as needed for mild pain. 04/03/18   Love, Ivan Anchors, PA-C  amLODipine (NORVASC) 2.5 MG tablet 2.5 mg daily.  12/08/19   [provider]  digoxin (LANOXIN) 0.125 MG tablet TAKE 1 TABLET BY MOUTH  DAILY 01/03/20   Sueanne Margarita, MD  finasteride (PROSCAR) 5 MG tablet Take 1 tablet (5 mg total) by mouth daily. 03/05/18   Love, Ivan Anchors, PA-C  Melatonin 3 MG TABS Take by mouth.    [provider]  metoprolol succinate (TOPROL-XL) 25 MG 24 hr tablet TAKE 1 TABLET BY MOUTH  DAILY 01/03/20   Sueanne Margarita, MD  Multiple Vitamin (MULTIVITAMIN WITH MINERALS) TABS tablet Take 1 tablet by mouth daily. 04/04/18   Love, Ivan Anchors, PA-C  simvastatin (ZOCOR) 10 MG tablet Take 1 tablet by mouth daily. 05/19/18   [provider]    Physical Exam: Vitals:  03/09/21 1802 03/09/21 1916 03/09/21 2013 03/09/21 2208  BP: (!) 151/82 (!) 158/78 (!) 151/80 (!) 146/91  Pulse: 60 62 (!) 56 64  Resp: 14 16 (!) 21 18  Temp:   97.6 F (36.4 C) 97.6 F (36.4 C)  TempSrc:   Oral Oral  SpO2: 98% 99% 99% 98%   Constitutional: Resting supine in bed, NAD, calm, comfortable Eyes: PERRL, lids and conjunctivae normal ENMT: Mucous membranes are moist. Posterior pharynx clear of any exudate or lesions.Normal dentition.  Neck: normal, supple, no masses. Respiratory: clear to auscultation bilaterally, no wheezing, no crackles. Normal respiratory effort. No accessory muscle use.  Cardiovascular: Irregularly irregular, no murmurs / rubs / gallops. No extremity edema. 2+ pedal pulses. Abdomen: no tenderness, no masses palpated. No hepatosplenomegaly. Bowel sounds positive.  Musculoskeletal: no clubbing / cyanosis. No joint deformity upper and lower extremities. Good ROM, no contractures. Normal muscle tone.  Skin: no rashes, lesions, ulcers. No induration Neurologic: Mild dysarthric speech otherwise CN 2-12 grossly intact. Sensation intact. Strength 5/5 in all 4.   Psychiatric: Normal judgment and insight. Alert and oriented x 3. Normal mood.   Labs on Admission: I have personally reviewed following labs and imaging studies  CBC: Recent Labs  Lab 03/09/21 1533  WBC 6.8  NEUTROABS 3.6  HGB 13.9  HCT 41.2  MCV 93.4  PLT 419   Basic Metabolic Panel: Recent Labs  Lab 03/09/21 1533  NA 130*  K 4.3  CL 93*  CO2 29  GLUCOSE 104*  BUN 16  CREATININE 0.93  CALCIUM 9.3   GFR: CrCl cannot be calculated (Unknown ideal weight.). Liver Function Tests: Recent Labs  Lab 03/09/21 1533  AST 26  ALT 17  ALKPHOS 50  BILITOT 1.0  PROT 8.0  ALBUMIN 4.2   No results for input(s): LIPASE, AMYLASE in the last 168 hours. No results for input(s): AMMONIA in the last 168 hours. Coagulation Profile: Recent Labs  Lab 03/09/21 1533  INR 1.1   Cardiac Enzymes: No results for input(s): CKTOTAL, CKMB, CKMBINDEX, TROPONINI in the last 168 hours. BNP (last 3 results) No results for input(s): PROBNP in the last 8760 hours. HbA1C: No results for input(s): HGBA1C in the last 72 hours. CBG: No results for input(s): GLUCAP in the last 168 hours. Lipid Profile: No results for input(s): CHOL, HDL, LDLCALC, TRIG, CHOLHDL, LDLDIRECT in the last 72 hours. Thyroid Function Tests: No results for input(s): TSH, T4TOTAL, FREET4, T3FREE, THYROIDAB in the last 72 hours. Anemia Panel: No results for input(s): VITAMINB12, FOLATE, FERRITIN, TIBC, IRON, RETICCTPCT in the last 72 hours. Urine analysis:    Component Value Date/Time   COLORURINE YELLOW 03/09/2021 [place] 03/09/2021 1605   LABSPEC 1.006 03/09/2021 1605   PHURINE 9.0 (H) 03/09/2021 1605   GLUCOSEU NEGATIVE 03/09/2021 1605   HGBUR MODERATE (A) 03/09/2021 1605   BILIRUBINUR NEGATIVE 03/09/2021 [place] 03/09/2021 1605   PROTEINUR 30 (A) 03/09/2021 1605   NITRITE NEGATIVE 03/09/2021 1605   LEUKOCYTESUR TRACE (A) 03/09/2021 1605    Radiological Exams on  Admission: CT Head Wo Contrast  Result Date: 03/09/2021 CLINICAL DATA:  Slurred speech EXAM: CT HEAD WITHOUT CONTRAST TECHNIQUE: Contiguous axial images were obtained from the base of the skull through the vertex without intravenous contrast. COMPARISON:  09/16/2018 FINDINGS: Brain: No evidence of acute infarction, hemorrhage, extra-axial collection, ventriculomegaly, or mass effect. Left frontal lobe encephalomalacia. Mild encephalomalacia of the right cerebellum. Generalized cerebral atrophy. Periventricular white matter low attenuation  likely secondary to microangiopathy. Vascular: Cerebrovascular atherosclerotic calcifications are noted. Skull: Negative for fracture or focal lesion. Prior left parietal craniotomy. Bilateral frontal and parietal burr holes. Sinuses/Orbits: Visualized portions of the orbits are unremarkable. Visualized portions of the paranasal sinuses are unremarkable. Visualized portions of the mastoid air cells are unremarkable. Other: None. IMPRESSION: 1. No acute intracranial pathology. 2. Chronic microvascular disease and cerebral atrophy. Electronically Signed   By: Kathreen Devoid   On: 03/09/2021 17:52   MR BRAIN WO CONTRAST  Result Date: 03/09/2021 CLINICAL DATA:  Speech disturbance beginning last night. EXAM: MRI HEAD WITHOUT CONTRAST TECHNIQUE: Multiplanar, multiecho pulse sequences of the brain and surrounding structures were obtained without intravenous contrast. COMPARISON:  Head CT earlier same day FINDINGS: Brain: Diffusion imaging shows a very small deep white matter infarction in the left posterior frontal region. Small acute infarction of a gyral surface in the deep insula on the left. No other convincing acute infarction. There are a few areas of susceptibility artifact along the surface of parieto-occipital convexities which simulate cortical ischemia but I think are not real findings. Chronic cerebellar atrophy with old small vessel infarctions. Chronic cerebral atrophy  with chronic small-vessel ischemic changes of the white matter and old infarctions in the left frontal operculum region. No mass, hemorrhage, hydrocephalus or extra-axial collection. Vascular: Major vessels at the base of the brain show flow. Skull and upper cervical spine: Negative Sinuses/Orbits: Clear/normal Other: None IMPRESSION: 1. Small acute infarction of a gyral surface in the deep insula on the left. Small deep white matter infarction in the left posterior frontal region. Findings consistent with small micro embolic infarctions. 2. Chronic small-vessel ischemic changes elsewhere throughout the brain as outlined above. Old cortical and subcortical infarction in the left frontal operculum and anterior insular region. Electronically Signed   By: Nelson Chimes M.D.   On: 03/09/2021 19:22    EKG: Personally reviewed. Atrial fibrillation, rate 71, low voltage.  Rate is faster when compared to prior.  Assessment/Plan Principal Problem:   Acute CVA (cerebrovascular accident) ([place]) Active Problems:   Permanent atrial fibrillation (HCC)   Benign essential HTN   Hyperlipidemia   OSA (obstructive sleep apnea)   [name] (coronary artery disease)   Chronic systolic CHF (congestive heart failure), NYHA class 2 (HCC)   Ryan Savage is a 87 y.o. male with medical history significant for chronic atrial fibrillation not on anticoagulation due to prior SDH x3 in 2019 and frequent falls, [name], chronic systolic CHF (last EF [id number] 05/17/2020), history of CVA, HTN, HLD, BPH, and OSA on CPAP who is admitted with acute stroke.  Acute deep left insular stroke: Seen on MRI brain.  Suspect cardioembolic versus small vessel infarct.  Has been given aspirin 325 mg once.  Further antithrombotic deferred to stroke team. -MRA head -Carotid Dopplers -Hemoglobin A1c, lipid panel -TTE deferred as patient cannot be on anticoagulation -PT/OT/SLP eval -Monitor on telemetry, continue neurochecks -Allow permissive  hypertension for now  Permanent atrial fibrillation: Remains in atrial fibrillation with controlled rate.  Not on anticoagulation due to history of SDH.  Continue digoxin, resume Toprol-XL when out of permissive hypertension window.  Hyponatremia: Mild with sodium 130.  History of SIADH.  Hold IV fluids and recheck in AM.  Chronic systolic CHF: Chronic and stable.  Not requiring diuretics as an outpatient.  [name]: Stable, denies chest pain.  Continue simvastatin.  Not currently on antiplatelets as an outpatient.  Hypertension: Resume home meds with permissive hypertension window.  Hyperlipidemia: Continue simvastatin.  OSA: Continue CPAP nightly.   DVT prophylaxis: SCDs Code Status: Full code, confirmed with patient  Family Communication: Discussed with patient, he has discussed with family Disposition Plan: From home, dispo pending further stroke eval Consults called: Neurology Level of care: Telemetry Medical Admission status:  Status is: Observation  The patient remains OBS appropriate and will d/c before 2 midnights.  Dispo: The patient is from: Home              Anticipated d/c is to: Home              Patient currently is not medically stable to d/c.   Difficult to place patient No   Zada Finders MD [facility]  If 7PM-7AM, please contact night-coverage [email or web address]  03/09/2021, 10:51 PM

## 2021-03-09 NOTE — ED Notes (Signed)
Attempted to call report x 1  

## 2021-03-09 NOTE — ED Triage Notes (Addendum)
Pt states his family told him to come in because his voice is abnormal. Pt states he went to bed fine last night. Pt denies numbness, weakness or pain. Pt has some slurred speech after long sentences when he woke up this morning.

## 2021-03-09 NOTE — ED Notes (Signed)
Pt transported to MRI 

## 2021-03-10 ENCOUNTER — Observation Stay (HOSPITAL_BASED_OUTPATIENT_CLINIC_OR_DEPARTMENT_OTHER): Payer: Medicare PPO

## 2021-03-10 DIAGNOSIS — Z8679 Personal history of other diseases of the circulatory system: Secondary | ICD-10-CM | POA: Diagnosis not present

## 2021-03-10 DIAGNOSIS — I6389 Other cerebral infarction: Secondary | ICD-10-CM | POA: Diagnosis not present

## 2021-03-10 DIAGNOSIS — I4821 Permanent atrial fibrillation: Secondary | ICD-10-CM

## 2021-03-10 DIAGNOSIS — Z8673 Personal history of transient ischemic attack (TIA), and cerebral infarction without residual deficits: Secondary | ICD-10-CM | POA: Diagnosis not present

## 2021-03-10 DIAGNOSIS — I639 Cerebral infarction, unspecified: Secondary | ICD-10-CM

## 2021-03-10 DIAGNOSIS — E7800 Pure hypercholesterolemia, unspecified: Secondary | ICD-10-CM | POA: Diagnosis not present

## 2021-03-10 DIAGNOSIS — I10 Essential (primary) hypertension: Secondary | ICD-10-CM | POA: Diagnosis not present

## 2021-03-10 DIAGNOSIS — I672 Cerebral atherosclerosis: Secondary | ICD-10-CM | POA: Diagnosis not present

## 2021-03-10 DIAGNOSIS — I6522 Occlusion and stenosis of left carotid artery: Secondary | ICD-10-CM | POA: Diagnosis not present

## 2021-03-10 LAB — ECHOCARDIOGRAM COMPLETE
AR max vel: 1.18 cm2
AV Area VTI: 1.4 cm2
AV Area mean vel: 1.28 cm2
AV Mean grad: 10 mmHg
AV Peak grad: 20 mmHg
Ao pk vel: 2.24 m/s
Area-P 1/2: 6.54 cm2
S' Lateral: 3.7 cm

## 2021-03-10 LAB — LIPID PANEL
Cholesterol: 106 mg/dL (ref 0–200)
HDL: 41 mg/dL (ref >40)
LDL Cholesterol: 54 mg/dL (ref 0–99)
Total CHOL/HDL Ratio: 2.6 RATIO
Triglycerides: 55 mg/dL (ref <150)
VLDL: 11 mg/dL (ref 0–40)

## 2021-03-10 LAB — HEMOGLOBIN A1C
Hgb A1c MFr Bld: 5.6 % (ref 4.8–5.6)
Mean Plasma Glucose: 114.02 mg/dL

## 2021-03-10 MED ORDER — ASPIRIN EC 81 MG PO TBEC
81.0000 mg | DELAYED_RELEASE_TABLET | Freq: Every day | ORAL | Status: DC
  Administered 2021-03-10: 81 mg via ORAL
  Filled 2021-03-10: qty 1

## 2021-03-10 MED ORDER — ASPIRIN EC 81 MG PO TBEC: 81.0000 mg | DELAYED_RELEASE_TABLET | Freq: Every day | ORAL | 2 refills | Status: AC

## 2021-03-10 NOTE — Discharge Summary (Signed)
Physician Discharge Summary  Ryan Savage OJJ:[id number] DOB: 1929-10-21 DOA: 03/09/2021  PCP: Lajean Manes, MD  Admit date: 03/09/2021 Discharge date: 03/10/2021  Admitted From: home Disposition:  home  Recommendations for Outpatient Follow-up:  1. Follow up with PCP in 1-2 weeks 2. Follow-up with neurology in 1 to 2 months  Home Health: none Equipment/Devices: none  Discharge Condition: stable CODE STATUS: Full code Diet recommendation: heart healthy  HPI: Per admitting MD, Ryan Savage is a 87 y.o. 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 Patient states he was in his usual state of health when he went to bed at 2300 on 03/08/2021.  He woke up around 3 AM on 4/29 feeling "off" and took off his CPAP mask to try to get some rest.  In the morning when he awoke he and his family noticed that his speech more slurred and became more pronounced when he was having a long conversation.  He was initially hesitant to seek medical attention however family eventually convinced him to come to the ED for further evaluation.  His symptoms did improve over the course of the day. He otherwise denies any headache, change in vision, numbness/tingling, facial droop, nausea, vomiting, chest pain, dyspnea, abdominal pain, weakness in his arms or legs.  Hospital Course / Discharge diagnoses: Principal problem Acute deep left insular stroke -patient was admitted to the hospital with an acute stroke.  Neurology consulted and followed patient while hospitalized.  Carotid ultrasound without significant occlusion.  A 2D echo showed an EF of 45-50% stable for him.  RV was normal.  Lipid panel showed an LDL of 54, A1c was 5.6.  Therapy is recommended no  follow-up.  Neurology recommends baby aspirin.  He is back to baseline, will be discharged home in stable condition with outpatient follow-up  Active problems Permanent atrial fibrillation - Remains in atrial fibrillation with controlled rate.  Not on anticoagulation due to history of SDH.  Hyponatremia - Mild with sodium 130.  History of SIADH.  Chronic systolic CHF - Chronic and stable.  Not requiring diuretics as an outpatient. [name]- Stable, denies chest pain.  Continue simvastatin.  Hypertension - Resume home medications Hyperlipidemia - Continue simvastatin. OSA - Continue CPAP nightly.  Sepsis ruled out   Discharge Instructions   Allergies as of 03/10/2021      Reactions   Methylphenidate Derivatives Other (See Comments)   Restless leg   Warfarin Sodium    Other reaction(s): subdural   Novocain [procaine] Rash      Medication List    TAKE these medications   acetaminophen 325 MG tablet Commonly known as: TYLENOL Take 1-2 tablets (325-650 mg total) by mouth every 4 (four) hours as needed for mild pain.   amLODipine 2.5 MG tablet Commonly known as: NORVASC Take 2.5 mg by mouth every evening.   ascorbic acid 500 MG tablet Commonly known as: VITAMIN C Take 500 mg by mouth daily.   digoxin 0.125 MG tablet Commonly known as: LANOXIN TAKE 1 TABLET BY MOUTH  DAILY   finasteride 5 MG tablet Commonly known as: PROSCAR Take 1 tablet (5 mg total) by mouth daily.   melatonin 3 MG Tabs tablet Take 3 mg by mouth at bedtime.   metoprolol succinate 25  MG 24 hr tablet Commonly known as: TOPROL-XL TAKE 1 TABLET BY MOUTH  DAILY   multivitamin with minerals Tabs tablet Take 1 tablet by mouth daily.   Quercetin 250 MG Tabs Take 500 mg by mouth daily.   simvastatin 10 MG tablet Commonly known as: ZOCOR Take 1 tablet by mouth every evening.   Vitamin D3 50 MCG (2000 UT) Tabs Take 2,000 Units by mouth every other day.   ZINC PO Take 1 tablet by mouth daily.        Consultations:  Neurology   Procedures/Studies:  CT Head Wo Contrast  Result Date: 03/09/2021 CLINICAL DATA:  Slurred speech EXAM: CT HEAD WITHOUT CONTRAST TECHNIQUE: Contiguous axial images were obtained from the base of the skull through the vertex without intravenous contrast. COMPARISON:  09/16/2018 FINDINGS: Brain: No evidence of acute infarction, hemorrhage, extra-axial collection, ventriculomegaly, or mass effect. Left frontal lobe encephalomalacia. Mild encephalomalacia of the right cerebellum. Generalized cerebral atrophy. Periventricular white matter low attenuation likely secondary to microangiopathy. Vascular: Cerebrovascular atherosclerotic calcifications are noted. Skull: Negative for fracture or focal lesion. Prior left parietal craniotomy. Bilateral frontal and parietal burr holes. Sinuses/Orbits: Visualized portions of the orbits are unremarkable. Visualized portions of the paranasal sinuses are unremarkable. Visualized portions of the mastoid air cells are unremarkable. Other: None. IMPRESSION: 1. No acute intracranial pathology. 2. Chronic microvascular disease and cerebral atrophy. Electronically Signed   By: Kathreen Devoid   On: 03/09/2021 17:52   MR ANGIO HEAD WO CONTRAST  Result Date: 03/10/2021 CLINICAL DATA:  Follow-up examination for acute stroke. EXAM: MRA HEAD WITHOUT CONTRAST TECHNIQUE: Angiographic images of the Circle of Willis were obtained using MRA technique without intravenous contrast. COMPARISON:  Prior MRI from 03/09/2021. FINDINGS: ANTERIOR CIRCULATION: Distal cervical segments of the internal carotid arteries are widely patent with antegrade flow. There is a short-segment moderate stenosis at the left cervical petrous junction (series 5, image 85). Petrous, cavernous, and supraclinoid ICAs otherwise patent without stenosis. A1 segments widely patent. Normal anterior communicating artery complex. Mild atheromatous irregularity within the ACAs without  hemodynamically significant stenosis. No M1 stenosis or occlusion. Normal MCA bifurcations. Distal MCA branches well perfused and symmetric. POSTERIOR CIRCULATION: Vertebral arteries are codominant and widely patent to the vertebrobasilar junction. Both PICA origins patent and normal. Basilar patent to its distal aspect without stenosis. Superior cerebellar arteries patent bilaterally. Both PCAs primarily supplied via the basilar well perfused to their distal aspects. No intracranial aneurysm. IMPRESSION: 1. Negative intracranial MRA for large vessel occlusion. 2. Short-segment moderate stenosis at the left cervical-petrous junction. 3. Otherwise negative intracranial MRA. No other hemodynamically significant or correctable stenosis. Electronically Signed   By: Jeannine Boga M.D.   On: 03/10/2021 01:03   MR BRAIN WO CONTRAST  Result Date: 03/09/2021 CLINICAL DATA:  Speech disturbance beginning last night. EXAM: MRI HEAD WITHOUT CONTRAST TECHNIQUE: Multiplanar, multiecho pulse sequences of the brain and surrounding structures were obtained without intravenous contrast. COMPARISON:  Head CT earlier same day FINDINGS: Brain: Diffusion imaging shows a very small deep white matter infarction in the left posterior frontal region. Small acute infarction of a gyral surface in the deep insula on the left. No other convincing acute infarction. There are a few areas of susceptibility artifact along the surface of parieto-occipital convexities which simulate cortical ischemia but I think are not real findings. Chronic cerebellar atrophy with old small vessel infarctions. Chronic cerebral atrophy with chronic small-vessel ischemic changes of the white matter and old infarctions in the left frontal operculum  region. No mass, hemorrhage, hydrocephalus or extra-axial collection. Vascular: Major vessels at the base of the brain show flow. Skull and upper cervical spine: Negative Sinuses/Orbits: Clear/normal Other: None  IMPRESSION: 1. Small acute infarction of a gyral surface in the deep insula on the left. Small deep white matter infarction in the left posterior frontal region. Findings consistent with small micro embolic infarctions. 2. Chronic small-vessel ischemic changes elsewhere throughout the brain as outlined above. Old cortical and subcortical infarction in the left frontal operculum and anterior insular region. Electronically Signed   By: Nelson Chimes M.D.   On: 03/09/2021 19:22   ECHOCARDIOGRAM COMPLETE  Result Date: 03/10/2021    ECHOCARDIOGRAM REPORT   Patient Name:   Ryan Savage Date of Exam: 03/10/2021 Medical Rec #:  [id number]       Height:       70.0 in Accession #:    [id number]      Weight:       183.4 lb Date of Birth:  [birth year]        BSA:          2.012 m Patient Age:    42 years        BP:           153/81 mmHg Patient Gender: M               HR:           55 bpm. Exam Location:  [facility] Procedure: 2D Echo, Cardiac Doppler and Color Doppler Indications:    CVA  History:        Patient has prior history of Echocardiogram examinations, most                 recent 05/17/2020. CHF, [name], Stroke, mild aortic stenosis,                 Arrythmias:Atrial Fibrillation; Risk Factors:Hypertension and                 Dyslipidemia.  Sonographer:    Dustin Flock Referring Phys: [id number] [place]  1. Left ventricular ejection fraction, by estimation, is 45 to 50%. The left ventricle has mildly decreased function. The left ventricle demonstrates global hypokinesis. There is mild left ventricular hypertrophy. Left ventricular diastolic function could not be evaluated.  2. Right ventricular systolic function is normal. The right ventricular size is normal. There is mildly elevated pulmonary artery systolic pressure. The estimated right ventricular systolic pressure is 56.3 mmHg.  3. Left atrial size was severely dilated.  4. Right atrial size was severely dilated.  5. The mitral valve is abnormal.  Mild mitral valve regurgitation.  6. The aortic valve is tricuspid. Aortic valve regurgitation is not visualized. Mild aortic valve stenosis. Aortic valve area, by VTI measures 1.40 cm. Aortic valve mean gradient measures 10.0 mmHg. Aortic valve Vmax measures 2.24 m/s.  7. The inferior vena cava is dilated in size with <50% respiratory variability, suggesting right atrial pressure of 15 mmHg. Comparison(s): No significant change from prior study. 05/17/20: LVEF 45-50%, severe biatrial enlargement, mild AS - mean gradient 9.6 mmHg. FINDINGS  Left Ventricle: Left ventricular ejection fraction, by estimation, is 45 to 50%. The left ventricle has mildly decreased function. The left ventricle demonstrates global hypokinesis. The left ventricular internal cavity size was normal in size. There is  mild left ventricular hypertrophy. Left ventricular diastolic function could not be evaluated due to atrial fibrillation. Left ventricular diastolic function could not be  evaluated. Right Ventricle: The right ventricular size is normal. No increase in right ventricular wall thickness. Right ventricular systolic function is normal. There is mildly elevated pulmonary artery systolic pressure. The tricuspid regurgitant velocity is 2.67  m/s, and with an assumed right atrial pressure of 15 mmHg, the estimated right ventricular systolic pressure is [id number]. Left Atrium: Left atrial size was severely dilated. Right Atrium: Right atrial size was severely dilated. Pericardium: There is no evidence of pericardial effusion. Mitral Valve: The mitral valve is abnormal. There is mild thickening of the mitral valve leaflet(s). Mild mitral valve regurgitation. Tricuspid Valve: The tricuspid valve is grossly normal. Tricuspid valve regurgitation is trivial. Aortic Valve: The aortic valve is tricuspid. Aortic valve regurgitation is not visualized. Mild aortic stenosis is present. Aortic valve mean gradient measures 10.0 mmHg. Aortic valve peak  gradient measures 20.0 mmHg. Aortic valve area, by VTI measures 1.40 cm. Pulmonic Valve: The pulmonic valve was grossly normal. Pulmonic valve regurgitation is trivial. Aorta: The aortic root and ascending aorta are structurally normal, with no evidence of dilitation. Venous: The inferior vena cava is dilated in size with less than 50% respiratory variability, suggesting right atrial pressure of 15 mmHg. IAS/Shunts: No atrial level shunt detected by color flow Doppler. EKG: Rhythm strip during this exam demostrated afib with very slow ventricular response.  LEFT VENTRICLE PLAX 2D LVIDd:         4.90 cm  Diastology LVIDs:         3.70 cm  LV e' medial:    9.46 cm/s LV PW:         1.20 cm  LV E/e' medial:  10.1 LV IVS:        1.20 cm  LV e' lateral:   12.20 cm/s LVOT diam:     2.20 cm  LV E/e' lateral: 7.8 LV SV:         76 LV SV Index:   38 LVOT Area:     3.80 cm  RIGHT VENTRICLE RV Basal diam:  3.60 cm RV S prime:     10.20 cm/s TAPSE (M-mode): 2.7 cm LEFT ATRIUM             Index       RIGHT ATRIUM           Index LA diam:        5.60 cm 2.78 cm/m  RA Area:     43.80 cm LA Vol (A2C):   85.5 ml 42.50 ml/m RA Volume:   181.00 ml 89.97 ml/m LA Vol (A4C):   96.6 ml 48.02 ml/m LA Biplane Vol: 90.8 ml 45.13 ml/m  AORTIC VALVE AV Area (Vmax):    1.18 cm AV Area (Vmean):   1.28 cm AV Area (VTI):     1.40 cm AV Vmax:           223.50 cm/s AV Vmean:          143.000 cm/s AV VTI:            0.541 m AV Peak Grad:      20.0 mmHg AV Mean Grad:      10.0 mmHg LVOT Vmax:         69.20 cm/s LVOT Vmean:        48.200 cm/s LVOT VTI:          0.199 m LVOT/AV VTI ratio: 0.37  AORTA Ao Root diam: 3.60 cm MITRAL VALVE  TRICUSPID VALVE MV Area (PHT): 6.54 cm    TR Peak grad:   28.5 mmHg MV Decel Time: 116 msec    TR Vmax:        267.00 cm/s MV E velocity: 95.50 cm/s MV A velocity: 37.70 cm/s  SHUNTS MV E/A ratio:  2.53        Systemic VTI:  0.20 m                            Systemic Diam: 2.20 cm Lyman Bishop MD  Electronically signed by Lyman Bishop MD Signature Date/Time: 03/10/2021/12:21:06 PM    Final    VAS US CAROTID  Result Date: 03/10/2021 Carotid Arterial Duplex Study Patient Name:  Ryan Savage  Date of Exam:   03/10/2021 Medical Rec #: [id number]        Accession #:    [id number] Date of Birth: 1929-06-28         Patient Gender: M Patient Age:   89Y Exam Location:  [facility] Procedure:      VAS US CAROTID Referring Phys: [id number] [place] --------------------------------------------------------------------------------  Indications:       CVA and Speech disturbance. Risk Factors:      Hypertension, hyperlipidemia, coronary artery disease. Other Factors:     Atrial fibrillation, not on anticoagulation secondary to SDH                    X 3 in 2019. CHF. OSA. Comparison Study:  Prior normal carotid duplex done 1//29/19 Performing Technologist: Sharion Dove RVS  Examination Guidelines: A complete evaluation includes B-mode imaging, spectral Doppler, color Doppler, and power Doppler as needed of all accessible portions of each vessel. Bilateral testing is considered an integral part of a complete examination. Limited examinations for reoccurring indications may be performed as noted.  Right Carotid Findings: +----------+--------+--------+--------+------------------+------------------+           PSV cm/sEDV cm/sStenosisPlaque DescriptionComments           +----------+--------+--------+--------+------------------+------------------+ CCA Prox  59      12                                intimal thickening +----------+--------+--------+--------+------------------+------------------+ CCA Distal68      14                                intimal thickening +----------+--------+--------+--------+------------------+------------------+ ICA Prox  37      10                                                    +----------+--------+--------+--------+------------------+------------------+ ICA Distal58      10                                                   +----------+--------+--------+--------+------------------+------------------+ ECA       58      5                                                    +----------+--------+--------+--------+------------------+------------------+ +----------+--------+-------+--------+-------------------+  PSV cm/sEDV cmsDescribeArm Pressure (mmHG) +----------+--------+-------+--------+-------------------+ Subclavian88                                         +----------+--------+-------+--------+-------------------+ +---------+--------+--+--------+--+ VertebralPSV cm/s79EDV cm/s11 +---------+--------+--+--------+--+  Left Carotid Findings: +----------+--------+--------+--------+------------------+------------------+           PSV cm/sEDV cm/sStenosisPlaque DescriptionComments           +----------+--------+--------+--------+------------------+------------------+ CCA Prox  78      13                                intimal thickening +----------+--------+--------+--------+------------------+------------------+ CCA Distal86      19                                intimal thickening +----------+--------+--------+--------+------------------+------------------+ ICA Prox  93      18                                                   +----------+--------+--------+--------+------------------+------------------+ ICA Distal93      19                                                   +----------+--------+--------+--------+------------------+------------------+ ECA       60      11                                                   +----------+--------+--------+--------+------------------+------------------+ +----------+--------+--------+--------+-------------------+           PSV cm/sEDV cm/sDescribeArm Pressure  (mmHG) +----------+--------+--------+--------+-------------------+ [id number]                                          +----------+--------+--------+--------+-------------------+ +---------+--------+--+--------+--+ VertebralPSV cm/s59EDV cm/s13 +---------+--------+--+--------+--+   Summary: Right Carotid: The extracranial vessels were near-normal with only minimal wall                thickening or plaque. Left Carotid: The extracranial vessels were near-normal with only minimal wall               thickening or plaque. Vertebrals:  Bilateral vertebral arteries demonstrate antegrade flow. Subclavians: Normal flow hemodynamics were seen in bilateral subclavian              arteries. *See table(s) above for measurements and observations.     Preliminary       Subjective: - no chest pain, shortness of breath, no abdominal pain, nausea or vomiting. Wants to go home. Feels back to normal  Discharge Exam: BP (!) 151/75 (BP Location: Right Arm)   Pulse 70   Temp (!) 97.5 F (36.4 C) (Oral)   Resp 18   SpO2 98%   General: Pt is alert, awake, not in acute distress Cardiovascular: Irregular Respiratory: CTA bilaterally, no wheezing,  no rhonchi Abdominal: Soft, NT, ND, bowel sounds + Extremities: no edema, no cyanosis   The results of significant diagnostics from this hospitalization (including imaging, microbiology, ancillary and laboratory) are listed below for reference.     Microbiology: Recent Results (from the past 240 hour(s))  Resp Panel by RT-PCR (Flu A&B, Covid) Nasopharyngeal Swab     Status: None   Collection Time: 03/09/21  8:21 PM   Specimen: Nasopharyngeal Swab; Nasopharyngeal(NP) swabs in vial transport medium  Result Value Ref Range Status   SARS Coronavirus 2 by RT PCR NEGATIVE NEGATIVE Final    Comment: (NOTE) SARS-CoV-2 target nucleic acids are NOT DETECTED.  The SARS-CoV-2 RNA is generally detectable in upper respiratory specimens during the acute phase of  infection. The lowest concentration of SARS-CoV-2 viral copies this assay can detect is 138 copies/mL. A negative result does not preclude SARS-Cov-2 infection and should not be used as the sole basis for treatment or other patient management decisions. A negative result may occur with  improper specimen collection/handling, submission of specimen other than nasopharyngeal swab, presence of viral mutation(s) within the areas targeted by this assay, and inadequate number of viral copies(<138 copies/mL). A negative result must be combined with clinical observations, patient history, and epidemiological information. The expected result is Negative.  Fact Sheet for Patients:  EntrepreneurPulse.com.au  Fact Sheet for Healthcare Providers:  IncredibleEmployment.be  This test is no t yet approved or cleared by the [place] FDA and  has been authorized for detection and/or diagnosis of SARS-CoV-2 by FDA under an Emergency Use Authorization (EUA). This EUA will remain  in effect (meaning this test can be used) for the duration of the COVID-19 declaration under Section 564(b)(1) of the Act, 21 U.S.C.section 360bbb-3(b)(1), unless the authorization is terminated  or revoked sooner.       Influenza A by PCR NEGATIVE NEGATIVE Final   Influenza B by PCR NEGATIVE NEGATIVE Final    Comment: (NOTE) The Xpert Xpress SARS-CoV-2/FLU/RSV plus assay is intended as an aid in the diagnosis of influenza from Nasopharyngeal swab specimens and should not be used as a sole basis for treatment. Nasal washings and aspirates are unacceptable for Xpert Xpress SARS-CoV-2/FLU/RSV testing.  Fact Sheet for Patients: EntrepreneurPulse.com.au  Fact Sheet for Healthcare Providers: IncredibleEmployment.be  This test is not yet approved or cleared by the [place] FDA and has been authorized for detection and/or diagnosis of SARS-CoV-2  by FDA under an Emergency Use Authorization (EUA). This EUA will remain in effect (meaning this test can be used) for the duration of the COVID-19 declaration under Section 564(b)(1) of the Act, 21 U.S.C. section 360bbb-3(b)(1), unless the authorization is terminated or revoked.  Performed at [facility], [place] [address] [place], [place] [address]      Labs: Basic Metabolic Panel: Recent Labs  Lab 03/09/21 1533  NA 130*  K 4.3  CL 93*  CO2 29  GLUCOSE 104*  BUN 16  CREATININE 0.93  CALCIUM 9.3   Liver Function Tests: Recent Labs  Lab 03/09/21 1533  AST 26  ALT 17  ALKPHOS 50  BILITOT 1.0  PROT 8.0  ALBUMIN 4.2   CBC: Recent Labs  Lab 03/09/21 1533  WBC 6.8  NEUTROABS 3.6  HGB 13.9  HCT 41.2  MCV 93.4  PLT 150   CBG: No results for input(s): GLUCAP in the last 168 hours. Hgb A1c Recent Labs    03/10/21 0234  HGBA1C 5.6   Lipid Profile Recent Labs  03/10/21 0234  CHOL 106  HDL 41  LDLCALC 54  TRIG 55  CHOLHDL 2.6   Thyroid function studies No results for input(s): TSH, T4TOTAL, T3FREE, THYROIDAB in the last 72 hours.  Invalid input(s): FREET3 Urinalysis    Component Value Date/Time   COLORURINE YELLOW 03/09/2021 [place] 03/09/2021 1605   LABSPEC 1.006 03/09/2021 1605   PHURINE 9.0 (H) 03/09/2021 1605   GLUCOSEU NEGATIVE 03/09/2021 1605   HGBUR MODERATE (A) 03/09/2021 1605   BILIRUBINUR NEGATIVE 03/09/2021 [place] 03/09/2021 1605   PROTEINUR 30 (A) 03/09/2021 1605   NITRITE NEGATIVE 03/09/2021 1605   LEUKOCYTESUR TRACE (A) 03/09/2021 1605    FURTHER DISCHARGE INSTRUCTIONS:   Get Medicines reviewed and adjusted: Please take all your medications with you for your next visit with your Primary MD   Laboratory/radiological data: Please request your Primary MD to go over all hospital tests and procedure/radiological results at the follow up, please ask your Primary MD to get all Hospital  records sent to his/her office.   In some cases, they will be blood work, cultures and biopsy results pending at the time of your discharge. Please request that your primary care M.D. goes through all the records of your hospital data and follows up on these results.   Also Note the following: If you experience worsening of your admission symptoms, develop shortness of breath, life threatening emergency, suicidal or homicidal thoughts you must seek medical attention immediately by calling 911 or calling your MD immediately  if symptoms less severe.   You must read complete instructions/literature along with all the possible adverse reactions/side effects for all the Medicines you take and that have been prescribed to you. Take any new Medicines after you have completely understood and accpet all the possible adverse reactions/side effects.    Do not drive when taking Pain medications or sleeping medications (Benzodaizepines)   Do not take more than prescribed Pain, Sleep and Anxiety Medications. It is not advisable to combine anxiety,sleep and pain medications without talking with your primary care practitioner   Special Instructions: If you have smoked or chewed Tobacco  in the last 2 yrs please stop smoking, stop any regular Alcohol  and or any Recreational drug use.   Wear Seat belts while driving.   Please note: You were cared for by a hospitalist during your hospital stay. Once you are discharged, your primary care physician will handle any further medical issues. Please note that NO REFILLS for any discharge medications will be authorized once you are discharged, as it is imperative that you return to your primary care physician (or establish a relationship with a primary care physician if you do not have one) for your post hospital discharge needs so that they can reassess your need for medications and monitor your lab values.  Time coordinating discharge: 40 minutes  SIGNED:  Marzetta Board, MD, PhD 03/10/2021, 12:57 PM

## 2021-03-10 NOTE — Progress Notes (Signed)
STROKE TEAM PROGRESS NOTE   INTERVAL HISTORY His wife is at the bedside.  Pt is dressing up sitting in chair, eager to go home. Per wife, pt speech is back to his baseline. However, he did have mild dysarthria. But able to repeat and name without difficulty. Discussed with pt and wife about ASA 81 and they are in agreement.   OBJECTIVE Vitals:   03/09/21 2354 03/10/21 0227 03/10/21 0351 03/10/21 0526  BP: 136/72 120/78 107/66 118/74  Pulse: (!) 45 (!) 46 (!) 58 (!) 56  Resp: 17 18 18 18   Temp: 97.8 F (36.6 C) 97.6 F (36.4 C) 97.7 F (36.5 C) 98 F (36.7 C)  TempSrc: Oral Oral Oral   SpO2: 97% 98% 98% 96%    CBC:  Recent Labs  Lab 03/09/21 1533  WBC 6.8  NEUTROABS 3.6  HGB 13.9  HCT 41.2  MCV 93.4  PLT 161    Basic Metabolic Panel:  Recent Labs  Lab 03/09/21 1533  NA 130*  K 4.3  CL 93*  CO2 29  GLUCOSE 104*  BUN 16  CREATININE 0.93  CALCIUM 9.3    Lipid Panel:     Component Value Date/Time   CHOL 106 03/10/2021 0234   CHOL 107 01/12/2019 0832   TRIG 55 03/10/2021 0234   HDL 41 03/10/2021 0234   HDL 37 (L) 01/12/2019 0832   CHOLHDL 2.6 03/10/2021 0234   VLDL 11 03/10/2021 0234   LDLCALC 54 03/10/2021 0234   LDLCALC 51 01/12/2019 0832   HgbA1c:  Lab Results  Component Value Date   HGBA1C 5.6 03/10/2021   Urine Drug Screen:     Component Value Date/Time   LABOPIA NONE DETECTED 03/09/2021 1605   COCAINSCRNUR NONE DETECTED 03/09/2021 1605   LABBENZ NONE DETECTED 03/09/2021 1605   AMPHETMU NONE DETECTED 03/09/2021 1605   THCU NONE DETECTED 03/09/2021 1605   LABBARB NONE DETECTED 03/09/2021 1605    Alcohol Level     Component Value Date/Time   ETH <10 03/09/2021 1533    IMAGING   CT Head Wo Contrast  Result Date: 03/09/2021 CLINICAL DATA:  Slurred speech EXAM: CT HEAD WITHOUT CONTRAST TECHNIQUE: Contiguous axial images were obtained from the base of the skull through the vertex without intravenous contrast. COMPARISON:  09/16/2018  FINDINGS: Brain: No evidence of acute infarction, hemorrhage, extra-axial collection, ventriculomegaly, or mass effect. Left frontal lobe encephalomalacia. Mild encephalomalacia of the right cerebellum. Generalized cerebral atrophy. Periventricular white matter low attenuation likely secondary to microangiopathy. Vascular: Cerebrovascular atherosclerotic calcifications are noted. Skull: Negative for fracture or focal lesion. Prior left parietal craniotomy. Bilateral frontal and parietal burr holes. Sinuses/Orbits: Visualized portions of the orbits are unremarkable. Visualized portions of the paranasal sinuses are unremarkable. Visualized portions of the mastoid air cells are unremarkable. Other: None. IMPRESSION: 1. No acute intracranial pathology. 2. Chronic microvascular disease and cerebral atrophy. Electronically Signed   By: Kathreen Devoid   On: 03/09/2021 17:52   MR ANGIO HEAD WO CONTRAST  Result Date: 03/10/2021 CLINICAL DATA:  Follow-up examination for acute stroke. EXAM: MRA HEAD WITHOUT CONTRAST TECHNIQUE: Angiographic images of the Circle of Willis were obtained using MRA technique without intravenous contrast. COMPARISON:  Prior MRI from 03/09/2021. FINDINGS: ANTERIOR CIRCULATION: Distal cervical segments of the internal carotid arteries are widely patent with antegrade flow. There is a short-segment moderate stenosis at the left cervical petrous junction (series 5, image 85). Petrous, cavernous, and supraclinoid ICAs otherwise patent without stenosis. A1 segments widely patent. Normal anterior  communicating artery complex. Mild atheromatous irregularity within the ACAs without hemodynamically significant stenosis. No M1 stenosis or occlusion. Normal MCA bifurcations. Distal MCA branches well perfused and symmetric. POSTERIOR CIRCULATION: Vertebral arteries are codominant and widely patent to the vertebrobasilar junction. Both PICA origins patent and normal. Basilar patent to its distal aspect without  stenosis. Superior cerebellar arteries patent bilaterally. Both PCAs primarily supplied via the basilar well perfused to their distal aspects. No intracranial aneurysm. IMPRESSION: 1. Negative intracranial MRA for large vessel occlusion. 2. Short-segment moderate stenosis at the left cervical-petrous junction. 3. Otherwise negative intracranial MRA. No other hemodynamically significant or correctable stenosis. Electronically Signed   By: Jeannine Boga M.D.   On: 03/10/2021 01:03   MR BRAIN WO CONTRAST  Result Date: 03/09/2021 CLINICAL DATA:  Speech disturbance beginning last night. EXAM: MRI HEAD WITHOUT CONTRAST TECHNIQUE: Multiplanar, multiecho pulse sequences of the brain and surrounding structures were obtained without intravenous contrast. COMPARISON:  Head CT earlier same day FINDINGS: Brain: Diffusion imaging shows a very small deep white matter infarction in the left posterior frontal region. Small acute infarction of a gyral surface in the deep insula on the left. No other convincing acute infarction. There are a few areas of susceptibility artifact along the surface of parieto-occipital convexities which simulate cortical ischemia but I think are not real findings. Chronic cerebellar atrophy with old small vessel infarctions. Chronic cerebral atrophy with chronic small-vessel ischemic changes of the white matter and old infarctions in the left frontal operculum region. No mass, hemorrhage, hydrocephalus or extra-axial collection. Vascular: Major vessels at the base of the brain show flow. Skull and upper cervical spine: Negative Sinuses/Orbits: Clear/normal Other: None IMPRESSION: 1. Small acute infarction of a gyral surface in the deep insula on the left. Small deep white matter infarction in the left posterior frontal region. Findings consistent with small micro embolic infarctions. 2. Chronic small-vessel ischemic changes elsewhere throughout the brain as outlined above. Old cortical and  subcortical infarction in the left frontal operculum and anterior insular region. Electronically Signed   By: Nelson Chimes M.D.   On: 03/09/2021 19:22     PHYSICAL EXAM  Temp:  [97.4 F (36.3 C)-98 F (36.7 C)] 97.5 F (36.4 C) (04/30 1154) Pulse Rate:  [45-70] 70 (04/30 1154) Resp:  [14-21] 18 (04/30 1154) BP: (107-176)/(66-106) 151/75 (04/30 1154) SpO2:  [96 %-100 %] 98 % (04/30 1154)  General - Well nourished, well developed, in no apparent distress.  Ophthalmologic - fundi not visualized due to noncooperation.  Cardiovascular - Regular rhythm and rate.  Mental Status -  Level of arousal and orientation to time, place, and person were intact. Language including expression, naming, repetition, comprehension was assessed and found intact. Mild dysarthria Fund of Knowledge was assessed and was intact.  Cranial Nerves II - XII - II - Visual field intact OU. III, IV, VI - Extraocular movements intact. V - Facial sensation intact bilaterally. VII - Facial movement intact bilaterally. VIII - Hearing & vestibular intact bilaterally. X - Palate elevates symmetrically. XI - Chin turning & shoulder shrug intact bilaterally. XII - Tongue protrusion intact.  Motor Strength - The patient's strength was symmetrical in all extremities and pronator drift was absent.  Bulk was normal and fasciculations were absent.   Motor Tone - Muscle tone was assessed at the neck and appendages and was normal.  Reflexes - The patient's reflexes were symmetrical in all extremities and he had no pathological reflexes.  Sensory - Light touch, temperature/pinprick were assessed  and were symmetrical.    Coordination - The patient had normal movements in the hands with no ataxia or dysmetria.  Tremor was absent.  Gait and Station - deferred.    ASSESSMENT/PLAN Mr. Demarqus Jocson is a 87 y.o. male with history of afibb not on AC or Antiplatelet due to prior hx of SDH x 3 and falls who presents with  small acute deep left insular stroke. He did not receive IV t-PA due to hx of SDH.  Stroke: left MCA several punctate cortical infarcts, embolic pattern, could be due to AF not on AC vs. Left ICA moderate stenosis  CT head - No acute intracranial pathology. Chronic microvascular disease and cerebral atrophy  MRI head - Small acute infarction of a gyral surface in the deep insula on the left. Small deep white matter infarction in the left posterior frontal region. Findings consistent with small micro embolic infarctions. Old cortical and subcortical infarction in the left frontal operculum and anterior insular region.  MRA head - Short-segment moderate stenosis at the left cervical-petrous junction.   Carotid Doppler - near normal  2D Echo - EF 45 - 50%. No cardiac source of emboli identified.   LDL - 54  HgbA1c - 5.6  VTE prophylaxis - SCDs  No antithrombotic prior to admission, discussed with pt and his wife and they agree with ASA 81mg  daily. Pt not AC candidate given severe SDH in 2019 and right cerebellar ICH in 2019.   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 fib  EKG showed persistent AF  Not candidate for anticoagulation  Rate controlled  Continue aspirin 81  Hypertension  Home BP meds: Norvasc ; metoprolol  Stable . Long-term BP goal normotensive  Hyperlipidemia  Home Lipid lowering medication: Zocor 10 mg  LDL 54, goal < 70  Current lipid lowering medication: Zocor 10 mg daily  Continue statin at discharge  Other Stroke Risk Factors  Advanced age  Former cigarette smoker - quit  Coronary artery  disease  Obstructive sleep apnea  Other Active Problems, Findings, Recommendations and/or Plan  Hyponatremia, Na 130  Mild bradycardia (on BB)   Hospital day # 0  Neurology will sign off. Please call with questions. Pt will follow up with stroke clinic Dr. Leonie Man at [facility] in about 4 weeks. Thanks for the consult.  Rosalin Hawking, MD PhD Stroke Neurology 03/10/2021 7:44 PM    To contact Stroke Continuity provider, please refer to [email or web address] After hours, contact General Neurology

## 2021-03-10 NOTE — Progress Notes (Signed)
Patient discharged home via wheelchair. AVS information reviewed and given to patient.

## 2021-03-10 NOTE — Progress Notes (Signed)
  Echocardiogram 2D Echocardiogram has been performed.  Matilde Bash 03/10/2021, 11:39 AM

## 2021-03-10 NOTE — Plan of Care (Signed)

## 2021-03-10 NOTE — Progress Notes (Signed)
VASCULAR LAB    Carotid duplex has been performed.  See CV proc for preliminary results.   Sharniece Gibbon, RVT 03/10/2021, 10:58 AM

## 2021-03-10 NOTE — Discharge Instructions (Signed)
Cognitive Rehabilitation After a Stroke After a stroke, you may have various problems with thinking (cognitive disability). The types of problems you have will depend on how severe the stroke was and where it was located in the brain. Problems may include:  Problems with short-term memory.  Trouble paying attention.  Trouble communicating or understanding language (aphasia).  A drop in mental ability that may interfere with daily life (dementia).  Trouble with problem-solving and information processing.  Problems with reading, writing, or math.  Problems with your ability to plan and to perform activities in sequence (executive function). These problems can feel overwhelming. However, with rehabilitation and time to heal, many people have improvement in their symptoms. What causes cognitive disability? A stroke happens when blood cannot flow to certain areas of the brain. When this happens, brain cells die in the affected areas because they cannot get oxygen and nutrients from the blood. Cognitive disability is caused by the death of cells in the areas of the brain that control thinking. What is cognitive rehabilitation? Cognitive rehabilitation is a program to help you improve your thinking skills after a stroke. Rehabilitation cannot completely reverse the effects of a stroke, but it can help you with memory, problem-solving, and communication skills. Therapy focuses on:  Improving brain function. This may involve activities such as learning to break down tasks into simple steps.  Helping you learn ways to cope with thinking problems. For example, you might learn memory tricks or do activities that stimulate memory, such as naming objects or describing pictures. Cognitive rehabilitation may include:  Speech-language therapy to help you understand and use language to communicate.  Occupational therapy to help you perform daily activities.  Music therapy to help relieve stress,  anxiety, and depression. This may involve listening to music, singing, or playing instruments.  Physical therapy to help improve your ability to move and perform actions that involve the muscles (motor functions).   When will therapy start and where will I have therapy? Your health care provider will decide when it is best for you to start therapy. In some cases, people start rehabilitation as soon as their health is stable, which may be 24-48 hours after the stroke. Rehabilitation can take place in a few different places, based on your needs. It may take place in:  The hospital or an in-patient rehabilitation hospital.  An outpatient rehabilitation facility.  A long-term care facility.  A community rehabilitation clinic.  Your home. What are some tools to help after a stroke? There are a number of tools and apps that you can use on your smartphone, personal computer, or tablet to help improve brain function. Some of these apps include:  Calendar reminders or alarm apps to help with memory.  Note-taking or sketch pad apps to help with memory or communication.  Text-to-speech apps that allow you to listen to what you are reading, which helps your ability to understanding text.  Picture dictionary or picture message apps to help with communication.  E-readers. These can highlight text as it is read aloud, which helps with listening and reading skills. How can my friends or family help during my rehabilitation? During your recovery, it is important that your friends and family members help you work toward more independence. Your caregivers should speak with your health care providers to learn how they can best help you during recovery. This may include working on speech-language or memory exercises at home, or helping with daily tasks and errands. If you have cognitive disability,  you may be at risk for injury or accidents at home, such as forgetting to turn off the stove. Friends and  family members can help ensure home safety by taking steps such as getting appliances with automatic shut-off features or storing dangerous objects in a secure place. What else should I know about cognitive rehabilitation after a stroke? Having trouble with memory and problem-solving can make you feel alone. You may also have mood changes, anxiety, or depression after a stroke. It is important to:  Stay connected with others through social groups, online support groups, or your community.  Talk to your friends, family, and caregivers about any emotional problems you are having.  Go to one-on-one or group therapy as suggested by your health care provider.  Stay physically active and exercise as often as suggested by your health care provider. Summary  After a stroke, some people have problems with thinking that affect attention, memory, language, communication, and problem-solving.  Cognitive rehabilitation is a program to help you regain brain function and learn skills to cope with thinking problems.  Rehabilitation cannot completely reverse the effects of a stroke, but it can help to improve quality of life.  Cognitive rehabilitation may include speech-language therapy, occupational therapy, music therapy, and physical therapy. This information is not intended to replace advice given to you by your health care provider. Make sure you discuss any questions you have with your health care provider. Document Revised: 02/17/2019 Document Reviewed: 01/31/2017 Elsevier Patient Education  2021 [place] After a Stroke Driving can be dangerous after a stroke because a stroke can cause physical, emotional, cognitive, and behavioral changes. Damage to your brain and other parts of your nervous system may affect your ability to drive. You may have weakness, stiffness, and pain, and have problems moving, talking, seeing, touching, or problem-solving. A stroke can also cause inability to  move (paralysis) on one side of your body. Can I return to driving? Ask your health care provider when it is safe for you to drive. Laws on driving after a stroke vary by state. Your health care provider may recommend that you:  Get a driving evaluation to have your vision, thinking, reaction time, and driving skills tested.  Take a driving rehabilitation program for people who have had a stroke.  Take a driving class or a retraining program.   How is driving affected by a stroke? A family member may be the first to notice that it is not safe for you to drive. You may have problems with:  Your vision.  Talking and communicating.  Weakness, pain, and stiffness in your arms or legs.  Responding to changes on the road.  Using the steering wheel, pedals, and other parts of the car.  Thinking while driving.  Judgment on the road. What are some signs that it may not be safe for me to drive? Signs that driving may be unsafe for you include:  Driving too fast or too slowly.  Needing help from others while driving.  Not paying attention to street signs or signals.  Making bad decisions while driving.  Not keeping enough distance between cars.  Drifting into other lanes.  Becoming confused, angry, or frustrated.  Getting lost in familiar places.  Having accidents while driving. What is adaptive equipment? Adaptive equipment refers to devices that can help people who have had a stroke to drive and do other activities. You may need:  A wheelchair-accessible car.  Special hand controls in the car.  Pedal extensions for the car.  A seat base to help you stay positioned in your seat.  Lifts and ramps to help you get in and out of the car. Summary  Damage to your brain and other parts of your nervous system may affect your ability to drive.  Ask your health care provider when it is safe for you to drive again. You may need to take steps such as getting a driving  evaluation or taking a driving class.  A family member may be the first to notice that it is not safe for you to drive.  You may need adaptive equipment to drive safely. This information is not intended to replace advice given to you by your health care provider. Make sure you discuss any questions you have with your health care provider. Document Revised: 10/10/2017 Document Reviewed: 02/03/2017 Elsevier Patient Education  2021 [place].   Preventing Atrial Fibrillation-Related Stroke Atrial fibrillation (AFib) is a common type of irregular or rapid heartbeat (arrhythmia) that increases the risk for a stroke. In AFib, the top portions of the heart (atria) beat out of sync with the lower portions of the heart. When the muscles of the atria tighten in an uncoordinated way (fibrillating), blood can pool in the heart and form clots. A stroke can be caused by a blood clot that travels to the brain. This type of stroke is preventable. Understanding AFib and knowing how to properly manage it can prevent a stroke from happening. How can this condition affect me? Having AFib can increase your risk for a stroke. A stroke is a medical emergency. It can lead to brain damage and can sometimes be life-threatening. A stroke can be a life-changing event. What can increase my risk? Having AFib can increase your risk of stroke. Other risk factors include:  Having heart failure.  Having high blood pressure.  Being older than age 42.  Having diabetes.  Having a history of vascular disease, such as heart attack or stroke.  Being male.  Having a history of transient ischemic attacks (TIAs). This is sometimes called a ministroke.  Having a family history of stroke. Risk factors that you can change include:  Smoking.  High cholesterol.  Being inactive (sedentary lifestyle).  Eating a diet that is high in fat, cholesterol, and salt. What actions can I take to prevent this?  Maintain a  healthy weight.  Get regular exercise. This can include at least 40 minutes of moderate exercise on most days, such as walking at a fast pace, or vigorous exercise, such as jogging.  Get evaluated for obstructive sleep apnea. Talk to your health care provider about getting a sleep evaluation if you snore a lot or have excessive sleepiness.  Manage any other medical conditions you have, such as hypertension or diabetes. Medicines  Take over-the-counter and prescription medicines only as told by your health care provider.  If your health care provider prescribed an anticoagulant, take it exactly as told. Taking too much blood-thinning medicine can cause bleeding. Taking too little may not give you the protection that you need against stroke and other problems. Eating and drinking  Eat healthy foods, including at least 5 servings of fruits and vegetables a day.  Do not drink alcohol.  Do not drink beverages that have caffeine, such as coffee, soda, and tea.  Follow instructions about your diet as told by your health care provider. Questions to ask your health care provider Contact a health care provider if:  You notice  a change in the rate, rhythm, or strength of your heartbeat.  You feel dizzy.  You are taking an anticoagulant and you have more bruises than usual.  You get tired more easily when you exercise or do similar activities. Get help right away if:  You have chest pain.  You have trouble breathing.  You have pain in your abdomen.  You experience unusual sweating or weakness.  You take anticoagulants and you: ? Have severe headaches or confusion. ? Have blood in your vomit, bowel movement, or urine. ? Have bleeding that will not stop. ? Fall or injure your head.  You have any symptoms of a stroke. "BE FAST" is an easy way to remember the main warning signs of a stroke: ? B - Balance. Signs are dizziness, trouble walking, or loss of balance. ? E - Eyes. Signs are  trouble seeing or a sudden change in vision. ? F - Face. Signs are sudden weakness or numbness of the face, or the face or eyelid drooping on one side. ? A - Arms. Signs are weakness or numbness in an arm. This happens suddenly and usually on one side of the body. ? S - Speech. Signs are sudden trouble speaking, slurred speech, or trouble understanding what people say. ? T - Time. Time to call emergency services. Write down what time symptoms started.  You have other signs of a stroke, such as: ? A sudden, severe headache with no known cause. ? Nausea or vomiting. ? Seizure. These symptoms may represent a serious problem that is an emergency. Do not wait to see if the symptoms will go away. Get medical help right away. Call your local emergency services (911 in the [place] Do not drive yourself to the hospital.   Summary  Having atrial fibrillation (AFib) can increase the risk for a stroke. Talk with your health care provider about what symptoms to watch for.  AFib-related stroke is preventable. Proper management of AFib can prevent you from having a stroke.  Talk with your health care provider about whether anticoagulant medicine is right for you.  Learn the warning signs of a stroke and remember "BE FAST." This information is not intended to replace advice given to you by your health care provider. Make sure you discuss any questions you have with your health care provider. Document Revised: 07/06/2020 Document Reviewed: 07/06/2020 Elsevier Patient Education  [place].

## 2021-03-10 NOTE — Evaluation (Signed)
Occupational Therapy Evaluation and Discharge Patient Details Name: Ryan Savage MRN: [id number] DOB: 11/02/1929 Today's Date: 03/10/2021    History of Present Illness 87 y.o. male presented with aphasia. MRI brain showed small acute deep left insular stroke.  PMH significant for afib not on AC due to prior SDH x 3 in 2019 and falls, hx of BPH, HTN, OSA, GERD, MVP, prior SDH   Clinical Impression   This 87 yo male admitted with above presents to acute OT at an independent level for basic ADLs. No further OT needs, we will sign off.    Follow Up Recommendations  No OT follow up    Equipment Recommendations  None recommended by OT       Precautions / Restrictions Precautions Precautions: None Restrictions Weight Bearing Restrictions: No      Mobility Bed Mobility Overal bed mobility: Independent                  Transfers Overall transfer level: Independent                    Balance Overall balance assessment: Mild deficits observed, not formally tested                                         ADL either performed or assessed with clinical judgement   ADL Overall ADL's : Independent                                             Vision Baseline Vision/History: Wears glasses Wears Glasses: At all times Patient Visual Report: No change from baseline Vision Assessment?: Yes Eye Alignment: Within Functional Limits Ocular Range of Motion: Within Functional Limits Alignment/Gaze Preference: Within Defined Limits Tracking/Visual Pursuits: Able to track stimulus in all quads without difficulty Saccades: Within functional limits Convergence: Within functional limits Visual Fields: No apparent deficits            Pertinent Vitals/Pain Pain Assessment: No/denies pain     Hand Dominance Right   Extremity/Trunk Assessment Upper Extremity Assessment Upper Extremity Assessment: Overall WFL for tasks assessed            Communication Communication Communication: No difficulties   Cognition Arousal/Alertness: Awake/alert Behavior During Therapy: WFL for tasks assessed/performed Overall Cognitive Status: Within Functional Limits for tasks assessed                                                Home Living Family/patient expects to be discharged to:: Private residence Living Arrangements: Spouse/significant other Available Help at Discharge: Family;Available 24 hours/day Type of Home: House Home Access: Stairs to enter [employer] of Steps: 4 Entrance Stairs-Rails: Right Home Layout: One level     Bathroom Shower/Tub: Walk-in Hydrologist: Standard     Home Equipment: Grab bars - tub/shower   Additional Comments: Drives, very active  Lives With: Spouse    Prior Functioning/Environment Level of Independence: Independent                          OT Goals(Current goals can be found in the  care plan section) Acute Rehab OT Goals Patient Stated Goal: to go home and to an event at 4:00 he and wife are suppose to attend                AM-PAC OT "6 Clicks" Daily Activity     Outcome Measure Help from another person eating meals?: None Help from another person taking care of personal grooming?: None Help from another person toileting, which includes using toliet, bedpan, or urinal?: None Help from another person bathing (including washing, rinsing, drying)?: None Help from another person to put on and taking off regular upper body clothing?: None Help from another person to put on and taking off regular lower body clothing?: None 6 Click Score: 24   End of Session Equipment Utilized During Treatment: Gait belt  Activity Tolerance: Patient tolerated treatment well Patient left: in chair;with call bell/phone within reach;with chair alarm set                   Time: 1010-1030 OT Time Calculation (min): 20 min Charges:  OT  General Charges $OT Visit: 1 Visit OT Evaluation $OT Eval Moderate Complexity: 1 Mod  Ryan Savage, OTR/L Acute [employer] Pager [phone] Office [phone]   ]  Almon Register 03/10/2021, 10:38 AM

## 2021-03-10 NOTE — Evaluation (Signed)
Physical Therapy Evaluation & Discharge Patient Details Name: Ryan Savage MRN: [id number] DOB: [birth year] Today's Date: 03/10/2021   History of Present Illness  87 y.o. male presented with aphasia. MRI brain showed small acute deep left insular stroke.  PMH significant for afib not on AC due to prior SDH x 3 in 2019 and falls, hx of BPH, HTN, OSA, GERD, MVP, prior SDH  Clinical Impression  PTA, patient lives with wife and independent, driving and very active. Patient currently functioning at independent level for mobility. Educated patient on remaining active and walking program. No further skilled PT needs acutely. No PT follow up recommended at this time. PT will sign off.     Follow Up Recommendations No PT follow up    Equipment Recommendations  None recommended by PT    Recommendations for Other Services       Precautions / Restrictions Precautions Precautions: None Restrictions Weight Bearing Restrictions: No      Mobility  Bed Mobility Overal bed mobility: Independent                  Transfers Overall transfer level: Independent                  Ambulation/Gait Ambulation/Gait assistance: Independent Gait Distance (Feet): 200 Feet Assistive device: None Gait Pattern/deviations: WFL(Within Functional Limits)        Stairs Stairs: Yes Stairs assistance: Independent Stair Management: Two rails;Alternating pattern;Forwards Number of Stairs: 4    Wheelchair Mobility    Modified Rankin (Stroke Patients Only) Modified Rankin (Stroke Patients Only) Pre-Morbid Rankin Score: No symptoms Modified Rankin: No symptoms     Balance Overall balance assessment: Mild deficits observed, not formally tested                                           Pertinent Vitals/Pain Pain Assessment: No/denies pain    Home Living Family/patient expects to be discharged to:: Private residence Living Arrangements: Spouse/significant  other Available Help at Discharge: Family;Available 24 hours/day Type of Home: House Home Access: Stairs to enter Entrance Stairs-Rails: Psychiatric nurse of Steps: 4 Home Layout: One level Home Equipment: Grab bars - tub/shower Additional Comments: Drives, very active    Prior Function Level of Independence: Independent               Hand Dominance   Dominant Hand: Right    Extremity/Trunk Assessment   Upper Extremity Assessment Upper Extremity Assessment: Overall WFL for tasks assessed    Lower Extremity Assessment Lower Extremity Assessment: Overall WFL for tasks assessed       Communication   Communication: No difficulties  Cognition Arousal/Alertness: Awake/alert Behavior During Therapy: WFL for tasks assessed/performed Overall Cognitive Status: Within Functional Limits for tasks assessed                                        General Comments      Exercises     Assessment/Plan    PT Assessment Patent does not need any further PT services  PT Problem List         PT Treatment Interventions      PT Goals (Current goals can be found in the Care Plan section)  Acute Rehab PT Goals Patient Stated Goal: to go home and  to an event at 4:00 he and wife are suppose to attend PT Goal Formulation: With patient    Frequency     Barriers to discharge        Co-evaluation               AM-PAC PT "6 Clicks" Mobility  Outcome Measure Help needed turning from your back to your side while in a flat bed without using bedrails?: None Help needed moving from lying on your back to sitting on the side of a flat bed without using bedrails?: None Help needed moving to and from a bed to a chair (including a wheelchair)?: None Help needed standing up from a chair using your arms (e.g., wheelchair or bedside chair)?: None Help needed to walk in hospital room?: None Help needed climbing 3-5 steps with a railing? : None 6  Click Score: 24    End of Session   Activity Tolerance: Patient tolerated treatment well Patient left: in chair;with call bell/phone within reach;with family/visitor present Nurse Communication: Mobility status PT Visit Diagnosis: Unsteadiness on feet (R26.81);Muscle weakness (generalized) (M62.81)    Time: 7741-4239 PT Time Calculation (min) (ACUTE ONLY): 24 min   Charges:   PT Evaluation $PT Eval Low Complexity: 1 Low          Ryan Savage A. Ryan Savage PT, DPT Acute Rehabilitation Services Pager [phone] Office [phone]   Ryan Savage 03/10/2021, 1:07 PM

## 2021-03-10 NOTE — Evaluation (Signed)
Speech Language Pathology Evaluation Patient Details Name: Ryan Savage MRN: [id number] DOB: 1929-03-31 Today's Date: 03/10/2021 Time: 6283-1517 SLP Time Calculation (min) (ACUTE ONLY): 16.37 min  Problem List:  Patient Active Problem List   Diagnosis Date Noted  . Acute CVA (cerebrovascular accident) ([place]) 03/09/2021  . Aortic stenosis 05/22/2018  . Pulmonary HTN ([place]) 05/22/2018  . Subdural hemorrhage ([place]) 03/25/2018  . Subdural hematoma without coma ([place])   . Seizure prophylaxis   . Insomnia   . Thrombocytopenia ([place])   . History of subdural hematoma   . Tachypnea   . 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 Protein-calorie malnutrition, severe 03/20/2018  . Traumatic subdural hematoma ([place]) 02/24/2018  . Acute blood loss anemia   . Steroid-induced hyperglycemia   . 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 Chronic systolic CHF (congestive heart failure), NYHA class 2 ([place]) 02/20/2018  . Subdural hematoma ([place]) 02/20/2018  . SDH (subdural hematoma) ([place]) 01/25/2018  . Bilateral subdural hematomas ([place]) 01/23/2018  . Malnutrition of moderate degree 12/10/2017  . Cerebral embolism with cerebral infarction 12/09/2017  . Odynophagia 12/08/2017  . Syncope and collapse 12/08/2017  . Hyperlipidemia 12/08/2017  . OSA (obstructive sleep apnea) 12/08/2017  . Dysarthria 12/08/2017  . [name] (coronary artery disease) 12/08/2017  . Syndrome of inappropriate ADH (SIADH) secretion ([place]) 12/08/2017  . SIADH (syndrome of inappropriate ADH production) ([place])   . Subclinical hyperthyroidism   . Hyponatremia with excess extracellular fluid volume 11/18/2017  . Hyperglycemia 11/18/2017  . Supratherapeutic INR 11/18/2017  . Mitral valve prolapse   . Permanent atrial fibrillation ([place]) 11/08/2013  . Benign essential HTN 11/08/2013  . Mitral regurgitation 11/08/2013   Past Medical History:  Past Medical History:  Diagnosis Date  . Aortic stenosis 05/22/2018   Mild with  mean AVG 37mmHg by echo 05/2018  . Atrial fibrillation, chronic (HCC)    Not on anticoagulation due to history of bilateral subdural bleeds due to recurrent falls  . BPH (benign prostatic hypertrophy)   . [name] (coronary artery disease), native coronary artery    Severe two-vessel [name] with chronically occluded LAD and RCA with widely patent left main, intermediate, and left circumflex branches.  On medical management.  He has extensive collaterals.  . 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 fib with RVR.  Repeat echo 05/2018 with EF 45-50%.  . [name] (degenerative joint disease)   . GERD (gastroesophageal reflux disease)   . Hyperlipidemia    LDL goal less then 100  . Hypertension   . Microscopic hematuria    [name]  . Mitral valve prolapse    with mild MR  . OSA (obstructive sleep apnea)   . Pulmonary HTN (HCC)    PASP 26mmHg by echo 05/2018 but normal pressures on right heart cath.  . SDH (subdural hematoma) ([place]) 01/2018  . SIADH (syndrome of inappropriate ADH production) ([place])   . Skin cancer    "side of my nose" (11/19/2017)  . Stroke ([place]) 12/2017   "fully recovered"  . Thrombocytopenia (HCC)    Mild- platelet count 143,000 on 02/2011, stable 08/2011   Past Surgical History:  Past Surgical History:  Procedure Laterality Date  . APPENDECTOMY  1950  . BACK SURGERY    . BURR HOLE Bilateral 01/25/2018   Procedure: Haskell Flirt;  Surgeon: Kary Kos, MD;  Location: [facility];  Service: Neurosurgery;  Laterality: Bilateral;  . BURR HOLE Left 02/20/2018   Procedure: BURR HOLES for subdural hematoma;  Surgeon: Newman Pies, MD;  Location: [place] [facility];  Service: Neurosurgery;  Laterality: Left;  . BURR HOLE Left 03/20/2018   Procedure: Craniotomy for subdural hematoma;  Surgeon: Kristeen Miss, MD;  Location: [facility];  Service: Neurosurgery;  Laterality: Left;   . FOREARM FRACTURE SURGERY Right 1954  . LUMBAR DISC SURGERY  1959   ruptured disc repair  . RIGHT/LEFT HEART CATH AND CORONARY ANGIOGRAPHY N/A 11/21/2017   Procedure: RIGHT/LEFT HEART CATH AND CORONARY ANGIOGRAPHY;  Surgeon: Sherren Mocha, MD;  Location: [facility];  Service: Cardiovascular;  Laterality: N/A;  . SKIN CANCER EXCISION Left    "side of my nose"   HPI:  87 y.o. male presented with aphasia. MRI brain showed small acute deep left insular stroke.  PMH significant for afib not on AC due to prior SDH x 3 in 2019 and falls, hx of BPH, HTN, OSA, GERD, MVP, prior SDH.   Assessment / Plan / Recommendation Clinical Impression  Pt presents with subtle dysarthria of speech, fluent expression with no deficits in naming and normal comprehension.  He has mild baseline short-term memory deficits.  Pt lives with his wife; continues to be active - walks daily, mows the lawn, helps his wife with household chores. There are no f/u needs for speech.  Pt passed the [facility] swallow screen.  Our service will sign off.    SLP Assessment  SLP Recommendation/Assessment: Patient does not need any further Speech Lanaguage Pathology Services SLP Visit Diagnosis: Cognitive communication deficit (R41.841)    Follow Up Recommendations  None    Frequency and Duration     n/a      SLP Evaluation Cognition  Overall Cognitive Status: Within Functional Limits for tasks assessed Arousal/Alertness: Awake/alert Orientation Level: Oriented X4 Attention: Selective       Comprehension  Auditory Comprehension Overall Auditory Comprehension: Appears within functional limits for tasks assessed Commands: Within Functional Limits Conversation: Complex Visual Recognition/Discrimination Discrimination: Within Function Limits Reading Comprehension Reading Status: Within funtional limits    Expression Expression Primary Mode of Expression: Verbal Verbal Expression Overall Verbal Expression: Appears  within functional limits for tasks assessed Initiation: No impairment Level of Generative/Spontaneous Verbalization: Conversation Naming: No impairment Pragmatics: No impairment Written Expression Dominant Hand: Right   Oral / Motor  Oral Motor/Sensory Function Overall Oral Motor/Sensory Function: Within functional limits Motor Speech Overall Motor Speech: Impaired Respiration: Within functional limits Phonation: Normal Resonance: Within functional limits Articulation: Impaired Level of Impairment: Sentence Intelligibility: Intelligibility reduced Word: 75-100% accurate Phrase: 75-100% accurate Sentence: 75-100% accurate Motor Planning: Witnin functional limits   GO                    Juan Quam Laurice 03/10/2021, 9:51 AM Estill Bamberg L. Tivis Ringer, [place] Office number [phone] Pager [phone]

## 2021-03-10 NOTE — Progress Notes (Signed)
Pt w/ HR in 30's earlier in shift per telemetry. At around 0420 HR at 29. Both episodes un-sustained, pt asymptomatic, and Dr. Olevia Bowens informed.

## 2021-03-10 NOTE — Progress Notes (Signed)
TRH night shift.  The nursing staff reports that the patient has been having episodes of bradycardia in the 30s with a heart rate decreasing as low as 29 bpm (vagal stimulation while sleeping?).  Metoprolol succinate 25 mg p.o. daily held.  Tennis Must, MD.

## 2021-03-21 DIAGNOSIS — I63512 Cerebral infarction due to unspecified occlusion or stenosis of left middle cerebral artery: Secondary | ICD-10-CM | POA: Diagnosis not present

## 2021-03-21 DIAGNOSIS — I700 Atherosclerosis of aorta: Secondary | ICD-10-CM | POA: Diagnosis not present

## 2021-03-21 DIAGNOSIS — I10 Essential (primary) hypertension: Secondary | ICD-10-CM | POA: Diagnosis not present

## 2021-03-21 DIAGNOSIS — I4891 Unspecified atrial fibrillation: Secondary | ICD-10-CM | POA: Diagnosis not present

## 2021-03-28 ENCOUNTER — Other Ambulatory Visit: Payer: Self-pay

## 2021-03-28 ENCOUNTER — Ambulatory Visit: Payer: Medicare PPO | Admitting: Cardiology

## 2021-03-28 ENCOUNTER — Encounter: Payer: Self-pay | Admitting: Cardiology

## 2021-03-28 VITALS — BP 122/76 | HR 60 | Ht 70.0 in | Wt 174.0 lb

## 2021-03-28 DIAGNOSIS — I10 Essential (primary) hypertension: Secondary | ICD-10-CM

## 2021-03-28 DIAGNOSIS — I272 Pulmonary hypertension, unspecified: Secondary | ICD-10-CM | POA: Diagnosis not present

## 2021-03-28 DIAGNOSIS — G4733 Obstructive sleep apnea (adult) (pediatric): Secondary | ICD-10-CM

## 2021-03-28 DIAGNOSIS — E7800 Pure hypercholesterolemia, unspecified: Secondary | ICD-10-CM

## 2021-03-28 DIAGNOSIS — I350 Nonrheumatic aortic (valve) stenosis: Secondary | ICD-10-CM

## 2021-03-28 DIAGNOSIS — I4821 Permanent atrial fibrillation: Secondary | ICD-10-CM

## 2021-03-28 DIAGNOSIS — I5022 Chronic systolic (congestive) heart failure: Secondary | ICD-10-CM | POA: Diagnosis not present

## 2021-03-28 DIAGNOSIS — I2510 Atherosclerotic heart disease of native coronary artery without angina pectoris: Secondary | ICD-10-CM

## 2021-03-28 DIAGNOSIS — I340 Nonrheumatic mitral (valve) insufficiency: Secondary | ICD-10-CM

## 2021-03-28 MED ORDER — METOPROLOL SUCCINATE ER 25 MG PO TB24: 25.0000 mg | ORAL_TABLET | Freq: Every day | ORAL | 3 refills | Status: DC

## 2021-03-28 MED ORDER — AMLODIPINE BESYLATE 2.5 MG PO TABS
2.5000 mg | ORAL_TABLET | Freq: Every evening | ORAL | 3 refills | Status: DC
Start: 2021-03-28 — End: 2022-10-31

## 2021-03-28 MED ORDER — SIMVASTATIN 10 MG PO TABS: 10.0000 mg | ORAL_TABLET | Freq: Every evening | ORAL | 3 refills | Status: DC

## 2021-03-28 MED ORDER — DIGOXIN 125 MCG PO TABS: 0.1250 mg | ORAL_TABLET | Freq: Every day | ORAL | 3 refills | Status: DC

## 2021-03-28 NOTE — Patient Instructions (Signed)
Medication Instructions:  Your physician recommends that you continue on your current medications as directed. Please refer to the Current Medication list given to you today.  *If you need a refill on your cardiac medications before your next appointment, please call your pharmacy*  Lab Work: TODAY: Digoxin If you have labs (blood work) drawn today and your tests are completely normal, you will receive your results only by: Marland Kitchen MyChart Message (if you have MyChart) OR . A paper copy in the mail If you have any lab test that is abnormal or we need to change your treatment, we will call you to review the results.  Follow-Up: At [facility], you and your health needs are our priority.  As part of our continuing mission to provide you with exceptional heart care, we have created designated Provider Care Teams.  These Care Teams include your primary Cardiologist (physician) and Advanced Practice Providers (APPs -  Physician Assistants and Nurse Practitioners) who all work together to provide you with the care you need, when you need it.  Your next appointment:   1 year(s)  The format for your next appointment:   In Person  Provider:   You may see Fransico Him, MD or one of the following Advanced Practice Providers on your designated Care Team:    Melina Copa, PA-C  Ermalinda Barrios, PA-C

## 2021-03-28 NOTE — Addendum Note (Signed)
Addended by: Antonieta Iba on: 03/28/2021 03:26 PM   Modules accepted: Orders

## 2021-03-28 NOTE — Addendum Note (Signed)
Addended by: Antonieta Iba on: 03/28/2021 03:23 PM   Modules accepted: Orders

## 2021-03-28 NOTE — Progress Notes (Signed)
Cardiology Office Note:    Date:  03/28/2021   ID:  Ryan Savage, DOB January 15, 1929, MRN [id number]  PCP:  Lajean Manes, MD  Cardiologist:  Fransico Him, MD    Referring MD: Lajean Manes, MD   Chief Complaint  Patient presents with  . Coronary Artery Disease  . Hypertension  . Atrial Fibrillation  . Sleep Apnea  . Congestive Heart Failure  . Mitral Regurgitation    History of Present Illness:    Ryan Savage is a 87 y.o. male with a hx of posterior MV leaflet prolapse withmoderateMR,tricuspid valve prolapse with mild to moderate TR, moderate pulmonary hypertension,HTN and chronic atrial fibrillation on chronic anticoagulation.He has a known ischemic dilated cardiomyopathy withEF 30-35% with hypokinesis of the inferior, mid to apical anterior septal, inferior septal and apical anterior as well as apical myocardium.  Cath showed severe two-vessel coronary disease with chronic total occlusions of the LAD and RCA with extensive collaterals. His left main, circumflex and intermediate branches were all normal. He had low right and left-sided intracardiac filling pressures. Mitral regurgitation appeared mild to moderate and there were no significant V waves on a pulmonary capillary wedge tracings. It was felt his mitral regurgitation was not playing a role in his acute exacerbation of CHF.He was started on digoxin, Toprol and Lasix as well as Entresto.  He has a history of bilateral subdural hematomas with 9 mm right to left shift and his INR was reversed with vitamin K and K Centra 01/28/2018. He underwent bilateral bur holes for evacuation of subacute to chronic subdural hematomas. He was discharged home on 3/21/2019and was in rehab. He was then readmitted twice with recurrent bleeds. He is being followed by Neurosurgery. His Entresto was stopped in the hospital due to soft BP.  Since I saw him last he was rehospitalized with acute deep left insular CVA.  Carotid US  showed no significant occlusion.  2D echo showed EF 45-50% which was stable.  He was started on ASA.  He was in afib with CVR.    He is here today for followup and is doing well.  He denies any chest pain or pressure, SOB, DOE, PND, orthopnea, LE edema, dizziness, palpitations or syncope. He is compliant with his meds and is tolerating meds with no SE.    He is doing well with his CPAP device and thinks that he has gotten used to it.  he tolerates the mask and feels the pressure is adequate.  Since going on CPAP he feels rested in the am and has no significant daytime sleepiness.  He denies any significant mouth or nasal dryness or nasal congestion.  He does not think that he snores.     Past Medical History:  Diagnosis Date  . Aortic stenosis 05/22/2018   Mild with mean AVG 61mmHg by echo 05/2018  . Atrial fibrillation, chronic (HCC)    Not on anticoagulation due to history of bilateral subdural bleeds due to recurrent falls  . BPH (benign prostatic hypertrophy)   . [name] (coronary artery disease), native coronary artery    Severe two-vessel [name] with chronically occluded LAD and RCA with widely patent left main, intermediate, and left circumflex branches.  On medical management.  He has extensive collaterals.  . 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 fib with RVR.  Repeat echo 05/2018 with EF 45-50%.  Marland Kitchen  [name] (degenerative joint disease)   . GERD (gastroesophageal reflux disease)   . Hyperlipidemia    LDL goal less then 100  . Hypertension   . Microscopic hematuria    [name]  . Mitral valve prolapse    with mild MR  . OSA (obstructive sleep apnea)   . Pulmonary HTN (HCC)    PASP 26mmHg by echo 05/2018 but normal pressures on right heart cath.  . SDH (subdural hematoma) ([place]) 01/2018  . SIADH (syndrome of inappropriate ADH production) ([place])   . Skin  cancer    "side of my nose" (11/19/2017)  . Stroke ([place]) 12/2017   "fully recovered"  . Thrombocytopenia (HCC)    Mild- platelet count 143,000 on 02/2011, stable 08/2011    Past Surgical History:  Procedure Laterality Date  . APPENDECTOMY  1950  . BACK SURGERY    . BURR HOLE Bilateral 01/25/2018   Procedure: Haskell Flirt;  Surgeon: Kary Kos, MD;  Location: [facility];  Service: Neurosurgery;  Laterality: Bilateral;  . BURR HOLE Left 02/20/2018   Procedure: BURR HOLES for subdural hematoma;  Surgeon: Newman Pies, MD;  Location: [facility];  Service: Neurosurgery;  Laterality: Left;  . BURR HOLE Left 03/20/2018   Procedure: Craniotomy for subdural hematoma;  Surgeon: Kristeen Miss, MD;  Location: [facility];  Service: Neurosurgery;  Laterality: Left;  . FOREARM FRACTURE SURGERY Right 1954  . LUMBAR DISC SURGERY  1959   ruptured disc repair  . RIGHT/LEFT HEART CATH AND CORONARY ANGIOGRAPHY N/A 11/21/2017   Procedure: RIGHT/LEFT HEART CATH AND CORONARY ANGIOGRAPHY;  Surgeon: Sherren Mocha, MD;  Location: [facility];  Service: Cardiovascular;  Laterality: N/A;  . SKIN CANCER EXCISION Left    "side of my nose"    Current Medications: Current Meds  Medication Sig  . acetaminophen (TYLENOL) 325 MG tablet Take 1-2 tablets (325-650 mg total) by mouth every 4 (four) hours as needed for mild pain.  Marland Kitchen amLODipine (NORVASC) 2.5 MG tablet Take 2.5 mg by mouth every evening.  Marland Kitchen ascorbic acid (VITAMIN C) 500 MG tablet Take 500 mg by mouth daily.  Marland Kitchen aspirin EC 81 MG tablet Take 1 tablet (81 mg total) by mouth daily. Swallow whole.  . Cholecalciferol (VITAMIN D3) 50 MCG (2000 UT) TABS Take 2,000 Units by mouth every other day.  . digoxin (LANOXIN) 0.125 MG tablet TAKE 1 TABLET BY MOUTH  DAILY (Patient taking differently: Take 0.125 mg by mouth daily.)  . finasteride (PROSCAR) 5 MG tablet Take 1 tablet (5 mg total) by mouth daily.  . Melatonin 3 MG TABS Take 3 mg by mouth at bedtime.  . metoprolol  succinate (TOPROL-XL) 25 MG 24 hr tablet TAKE 1 TABLET BY MOUTH  DAILY (Patient taking differently: Take 25 mg by mouth daily.)  . Multiple Vitamin (MULTIVITAMIN WITH MINERALS) TABS tablet Take 1 tablet by mouth daily.  . Multiple Vitamins-Minerals (ZINC PO) Take 1 tablet by mouth daily.  . Quercetin 250 MG TABS Take 500 mg by mouth daily.  . simvastatin (ZOCOR) 10 MG tablet Take 1 tablet by mouth every evening.     Allergies:   Methylphenidate derivatives, Warfarin sodium, and Novocain [procaine]   Social History   Socioeconomic History  . Marital status: Married    Spouse name: Not on file  . Number of children: Not on file  . Years of education: Not on file  . Highest education level: Not on file  Occupational History  . Occupation: retired  Tobacco Use  .  Smoking status: Former Smoker    Years: 3.00    Types: Cigarettes    Quit date: 1958    Years since quitting: 64.4  . Smokeless tobacco: Never Used  Vaping Use  . Vaping Use: Never used  Substance and Sexual Activity  . Alcohol use: No  . Drug use: No  . Sexual activity: Not on file  Other Topics Concern  . Not on file  Social History Narrative  . Not on file   Social Determinants of Health   Financial Resource Strain: Not on file  Food Insecurity: Not on file  Transportation Needs: Not on file  Physical Activity: Not on file  Stress: Not on file  Social Connections: Not on file     Family History: The patient's family history includes [name] in his brother and father; Colon cancer in his brother and sister; Heart attack in his brother and father; Heart failure in his mother.  ROS:   Please see the history of present illness.    ROS  All other systems reviewed and negative.   EKGs/Labs/Other Studies Reviewed:    The following studies were reviewed today: none  EKG:  EKG is  ordered today.  The ekg ordered today demonstrates atrial fibrillation with CVR and no ST change  Recent Labs: 03/09/2021: ALT 17;  BUN 16; Creatinine, Ser 0.93; Hemoglobin 13.9; Platelets 150; Potassium 4.3; Sodium 130   Recent Lipid Panel    Component Value Date/Time   CHOL 106 03/10/2021 0234   CHOL 107 01/12/2019 0832   TRIG 55 03/10/2021 0234   HDL 41 03/10/2021 0234   HDL 37 (L) 01/12/2019 0832   CHOLHDL 2.6 03/10/2021 0234   VLDL 11 03/10/2021 0234   LDLCALC 54 03/10/2021 0234   LDLCALC 51 01/12/2019 0832    Physical Exam:    VS:  BP 122/76   Pulse 60   Ht 5\' 10"  (1.778 m)   Wt 174 lb (78.9 kg)   SpO2 98%   BMI 24.97 kg/m     Wt Readings from Last 3 Encounters:  03/28/21 174 lb (78.9 kg)  01/10/20 183 lb 6.4 oz (83.2 kg)  01/05/19 183 lb (83 kg)    GEN: Well nourished, well developed in no acute distress HEENT: Normal NECK: No JVD; No carotid bruits LYMPHATICS: No lymphadenopathy CARDIAC:RRR, no murmurs, rubs, gallops RESPIRATORY:  Clear to auscultation without rales, wheezing or rhonchi  ABDOMEN: Soft, non-tender, non-distended MUSCULOSKELETAL:  No edema; No deformity  SKIN: Warm and dry NEUROLOGIC:  Alert and oriented x 3 PSYCHIATRIC:  Normal affect    ASSESSMENT:    1. Permanent atrial fibrillation ([place])   2. Benign essential HTN   3. Nonrheumatic mitral valve regurgitation   4. Coronary artery disease involving native coronary artery of native heart without angina pectoris   5. Chronic systolic CHF (congestive heart failure), NYHA class 2 ([place])   6. Nonrheumatic aortic valve stenosis   7. Pulmonary HTN ([place])   8. OSA (obstructive sleep apnea)   9. Pure hypercholesterolemia    PLAN:    In order of problems listed above:  1.  Permanent atrial fibrillation  -he remains well rate controlled on exam today -Continue prescription drug management with digoxin 0.125mg  daily and Toprol XL 25mg  daily -will check a dig level -not a candidate for anticoagulation due to hx of multiple subdural bleeds  2.  HTN  -his BP remains well controlled on exam today -Continue prescription  drug management with Toprol XL 25mg  daily  and amlodipine 2.5mg  daily  3.  Mitral regurgitation  -echo 02/2021 showed mildly thickened MV leaflets with mild MR  4.  ASCAD  -severe 2 vessel [name] with chronic total occlusions of the LAD and RCA with extensive collaterals. His left main, circumflex and intermediate branches were all normal.   -he has not had any anginal sx since I saw him last -I have personally reviewed and interpreted outside labs and EKG as well as hospital records from admission 02/2021 for acute CVA which showed atrial fibrillation -Continue prescription drug management with Toprol and Simvastatin  -no ASA due to hx of subdural bleeds  5.  Chronic systolic CHF  -2D echo 07/1637 showed EF 45-50% -he does no appear volume overloaded on exam today -denies any SOB or LE edema -continue Toprol -had not required diuretics recently -continue with low Na diet  6.  Aortic stenosis  -2D echo 02/2021 showed mld AS -repeat echo 02/2022  7.  Pulmonary HTN  -moderate by echo 05/2019 with a PASP of 42 mmHg and recent echo ordered in hospital 02/2021 for CVA was personally reviewed by me showing mild PTH with stable PASP of 14mmHg   -will repeat 2D echo 02/2022 to make sure this remains stable  8. OSA  -The patient is tolerating PAP therapy well without any problems. -The patient has been using and benefiting from PAP use and will continue to benefit from therapy.  -I will get a download from his DME  9.  HLD -LDL goal < 70 I have personally reviewed and interpreted outside labs performed by patient's PCP which showed LDL at goal at 54 -continue simvastatin 10mg  daily>>refilled today for 1 year  Followup with me in 1 year  Medication Adjustments/Labs and Tests Ordered: Current medicines are reviewed at length with the patient today.  Concerns regarding medicines are outlined above.  No orders of the defined types were placed in this encounter.  No orders of the defined  types were placed in this encounter.   Signed, Fransico Him, MD  03/28/2021 3:12 PM    [facility]

## 2021-03-29 LAB — DIGOXIN LEVEL: Digoxin, Serum: 0.7 ng/mL (ref 0.5–0.9)

## 2021-04-26 DIAGNOSIS — L570 Actinic keratosis: Secondary | ICD-10-CM | POA: Diagnosis not present

## 2021-04-26 DIAGNOSIS — Z85828 Personal history of other malignant neoplasm of skin: Secondary | ICD-10-CM | POA: Diagnosis not present

## 2021-05-28 DIAGNOSIS — E222 Syndrome of inappropriate secretion of antidiuretic hormone: Secondary | ICD-10-CM | POA: Diagnosis not present

## 2021-05-28 DIAGNOSIS — Z79899 Other long term (current) drug therapy: Secondary | ICD-10-CM | POA: Diagnosis not present

## 2021-05-28 DIAGNOSIS — I4891 Unspecified atrial fibrillation: Secondary | ICD-10-CM | POA: Diagnosis not present

## 2021-05-28 DIAGNOSIS — E7800 Pure hypercholesterolemia, unspecified: Secondary | ICD-10-CM | POA: Diagnosis not present

## 2021-05-28 DIAGNOSIS — D649 Anemia, unspecified: Secondary | ICD-10-CM | POA: Diagnosis not present

## 2021-05-28 DIAGNOSIS — I5022 Chronic systolic (congestive) heart failure: Secondary | ICD-10-CM | POA: Diagnosis not present

## 2021-05-28 DIAGNOSIS — G4733 Obstructive sleep apnea (adult) (pediatric): Secondary | ICD-10-CM | POA: Diagnosis not present

## 2021-05-28 DIAGNOSIS — Z7389 Other problems related to life management difficulty: Secondary | ICD-10-CM | POA: Diagnosis not present

## 2021-05-28 DIAGNOSIS — I700 Atherosclerosis of aorta: Secondary | ICD-10-CM | POA: Diagnosis not present

## 2021-05-28 DIAGNOSIS — Z1389 Encounter for screening for other disorder: Secondary | ICD-10-CM | POA: Diagnosis not present

## 2021-05-28 DIAGNOSIS — Z0000 Encounter for general adult medical examination without abnormal findings: Secondary | ICD-10-CM | POA: Diagnosis not present

## 2021-05-28 DIAGNOSIS — I10 Essential (primary) hypertension: Secondary | ICD-10-CM | POA: Diagnosis not present

## 2021-06-06 ENCOUNTER — Inpatient Hospital Stay: Payer: Medicare PPO | Admitting: Neurology

## 2021-06-22 DIAGNOSIS — G4733 Obstructive sleep apnea (adult) (pediatric): Secondary | ICD-10-CM | POA: Diagnosis not present

## 2021-07-17 ENCOUNTER — Other Ambulatory Visit: Payer: Self-pay

## 2021-07-17 ENCOUNTER — Encounter (HOSPITAL_COMMUNITY): Payer: Self-pay

## 2021-07-17 ENCOUNTER — Inpatient Hospital Stay (HOSPITAL_COMMUNITY)
Admission: EM | Admit: 2021-07-17 | Discharge: 2021-07-20 | DRG: 065 | Disposition: A | Discharge status: Home Health | Payer: Medicare PPO | Attending: Internal Medicine | Admitting: Internal Medicine

## 2021-07-17 ENCOUNTER — Observation Stay (HOSPITAL_COMMUNITY): Payer: Medicare PPO

## 2021-07-17 ENCOUNTER — Emergency Department (HOSPITAL_COMMUNITY): Payer: Medicare PPO

## 2021-07-17 DIAGNOSIS — I255 Ischemic cardiomyopathy: Secondary | ICD-10-CM | POA: Diagnosis present

## 2021-07-17 DIAGNOSIS — I700 Atherosclerosis of aorta: Secondary | ICD-10-CM | POA: Diagnosis present

## 2021-07-17 DIAGNOSIS — I5022 Chronic systolic (congestive) heart failure: Secondary | ICD-10-CM | POA: Diagnosis present

## 2021-07-17 DIAGNOSIS — Z743 Need for continuous supervision: Secondary | ICD-10-CM | POA: Diagnosis not present

## 2021-07-17 DIAGNOSIS — H53461 Homonymous bilateral field defects, right side: Secondary | ICD-10-CM | POA: Diagnosis present

## 2021-07-17 DIAGNOSIS — I4821 Permanent atrial fibrillation: Secondary | ICD-10-CM | POA: Diagnosis present

## 2021-07-17 DIAGNOSIS — N390 Urinary tract infection, site not specified: Secondary | ICD-10-CM | POA: Diagnosis present

## 2021-07-17 DIAGNOSIS — R29705 NIHSS score 5: Secondary | ICD-10-CM | POA: Diagnosis present

## 2021-07-17 DIAGNOSIS — R531 Weakness: Secondary | ICD-10-CM | POA: Diagnosis not present

## 2021-07-17 DIAGNOSIS — I2510 Atherosclerotic heart disease of native coronary artery without angina pectoris: Secondary | ICD-10-CM | POA: Diagnosis present

## 2021-07-17 DIAGNOSIS — R471 Dysarthria and anarthria: Secondary | ICD-10-CM | POA: Diagnosis present

## 2021-07-17 DIAGNOSIS — I69320 Aphasia following cerebral infarction: Secondary | ICD-10-CM

## 2021-07-17 DIAGNOSIS — Z85828 Personal history of other malignant neoplasm of skin: Secondary | ICD-10-CM | POA: Diagnosis not present

## 2021-07-17 DIAGNOSIS — E785 Hyperlipidemia, unspecified: Secondary | ICD-10-CM | POA: Diagnosis present

## 2021-07-17 DIAGNOSIS — I272 Pulmonary hypertension, unspecified: Secondary | ICD-10-CM | POA: Diagnosis present

## 2021-07-17 DIAGNOSIS — N400 Enlarged prostate without lower urinary tract symptoms: Secondary | ICD-10-CM | POA: Diagnosis present

## 2021-07-17 DIAGNOSIS — I6389 Other cerebral infarction: Secondary | ICD-10-CM | POA: Diagnosis not present

## 2021-07-17 DIAGNOSIS — I4891 Unspecified atrial fibrillation: Secondary | ICD-10-CM | POA: Diagnosis not present

## 2021-07-17 DIAGNOSIS — Z8679 Personal history of other diseases of the circulatory system: Secondary | ICD-10-CM

## 2021-07-17 DIAGNOSIS — K219 Gastro-esophageal reflux disease without esophagitis: Secondary | ICD-10-CM | POA: Diagnosis present

## 2021-07-17 DIAGNOSIS — Z888 Allergy status to other drugs, medicaments and biological substances status: Secondary | ICD-10-CM

## 2021-07-17 DIAGNOSIS — Z8249 Family history of ischemic heart disease and other diseases of the circulatory system: Secondary | ICD-10-CM

## 2021-07-17 DIAGNOSIS — E222 Syndrome of inappropriate secretion of antidiuretic hormone: Secondary | ICD-10-CM | POA: Diagnosis present

## 2021-07-17 DIAGNOSIS — I639 Cerebral infarction, unspecified: Secondary | ICD-10-CM | POA: Diagnosis present

## 2021-07-17 DIAGNOSIS — Z9181 History of falling: Secondary | ICD-10-CM | POA: Diagnosis not present

## 2021-07-17 DIAGNOSIS — Z7982 Long term (current) use of aspirin: Secondary | ICD-10-CM

## 2021-07-17 DIAGNOSIS — I63432 Cerebral infarction due to embolism of left posterior cerebral artery: Secondary | ICD-10-CM | POA: Diagnosis present

## 2021-07-17 DIAGNOSIS — G4733 Obstructive sleep apnea (adult) (pediatric): Secondary | ICD-10-CM | POA: Diagnosis present

## 2021-07-17 DIAGNOSIS — Z79899 Other long term (current) drug therapy: Secondary | ICD-10-CM

## 2021-07-17 DIAGNOSIS — Z87891 Personal history of nicotine dependence: Secondary | ICD-10-CM

## 2021-07-17 DIAGNOSIS — G319 Degenerative disease of nervous system, unspecified: Secondary | ICD-10-CM | POA: Diagnosis not present

## 2021-07-17 DIAGNOSIS — R0902 Hypoxemia: Secondary | ICD-10-CM | POA: Diagnosis not present

## 2021-07-17 DIAGNOSIS — I10 Essential (primary) hypertension: Secondary | ICD-10-CM | POA: Diagnosis not present

## 2021-07-17 DIAGNOSIS — M5030 Other cervical disc degeneration, unspecified cervical region: Secondary | ICD-10-CM | POA: Diagnosis not present

## 2021-07-17 DIAGNOSIS — I110 Hypertensive heart disease with heart failure: Secondary | ICD-10-CM | POA: Diagnosis present

## 2021-07-17 DIAGNOSIS — Z20822 Contact with and (suspected) exposure to covid-19: Secondary | ICD-10-CM | POA: Diagnosis present

## 2021-07-17 DIAGNOSIS — Z800 Family history of malignant neoplasm of digestive organs: Secondary | ICD-10-CM

## 2021-07-17 DIAGNOSIS — E7800 Pure hypercholesterolemia, unspecified: Secondary | ICD-10-CM | POA: Diagnosis not present

## 2021-07-17 DIAGNOSIS — I619 Nontraumatic intracerebral hemorrhage, unspecified: Secondary | ICD-10-CM | POA: Diagnosis not present

## 2021-07-17 DIAGNOSIS — Z66 Do not resuscitate: Secondary | ICD-10-CM | POA: Diagnosis present

## 2021-07-17 DIAGNOSIS — Z8673 Personal history of transient ischemic attack (TIA), and cerebral infarction without residual deficits: Secondary | ICD-10-CM | POA: Diagnosis not present

## 2021-07-17 DIAGNOSIS — I63233 Cerebral infarction due to unspecified occlusion or stenosis of bilateral carotid arteries: Secondary | ICD-10-CM | POA: Diagnosis not present

## 2021-07-17 LAB — CBC WITH DIFFERENTIAL/PLATELET
Abs Immature Granulocytes: 0.02 10*3/uL (ref 0.00–0.07)
Basophils Absolute: 0.1 10*3/uL (ref 0.0–0.1)
Basophils Relative: 1 %
Eosinophils Absolute: 0 10*3/uL (ref 0.0–0.5)
Eosinophils Relative: 0 %
HCT: 38.2 % — ABNORMAL LOW (ref 39.0–52.0)
Hemoglobin: 12.9 g/dL — ABNORMAL LOW (ref 13.0–17.0)
Immature Granulocytes: 0 %
Lymphocytes Relative: 27 %
Lymphs Abs: 2.1 10*3/uL (ref 0.7–4.0)
MCH: 31.4 pg (ref 26.0–34.0)
MCHC: 33.8 g/dL (ref 30.0–36.0)
MCV: 92.9 fL (ref 80.0–100.0)
Monocytes Absolute: 1 10*3/uL (ref 0.1–1.0)
Monocytes Relative: 13 %
Neutro Abs: 4.5 10*3/uL (ref 1.7–7.7)
Neutrophils Relative %: 59 %
Platelets: 146 10*3/uL — ABNORMAL LOW (ref 150–400)
RBC: 4.11 MIL/uL — ABNORMAL LOW (ref 4.22–5.81)
RDW: 12.7 % (ref 11.5–15.5)
WBC: 7.6 10*3/uL (ref 4.0–10.5)
nRBC: 0 % (ref 0.0–0.2)

## 2021-07-17 LAB — COMPREHENSIVE METABOLIC PANEL
ALT: 16 U/L (ref 0–44)
AST: 27 U/L (ref 15–41)
Albumin: 3.8 g/dL (ref 3.5–5.0)
Alkaline Phosphatase: 44 U/L (ref 38–126)
Anion gap: 11 (ref 5–15)
BUN: 16 mg/dL (ref 8–23)
CO2: 24 mmol/L (ref 22–32)
Calcium: 9.3 mg/dL (ref 8.9–10.3)
Chloride: 93 mmol/L — ABNORMAL LOW (ref 98–111)
Creatinine, Ser: 0.93 mg/dL (ref 0.61–1.24)
GFR, Estimated: >60 mL/min (ref >60)
Glucose, Bld: 117 mg/dL — ABNORMAL HIGH (ref 70–99)
Potassium: 4 mmol/L (ref 3.5–5.1)
Sodium: 128 mmol/L — ABNORMAL LOW (ref 135–145)
Total Bilirubin: 1.5 mg/dL — ABNORMAL HIGH (ref 0.3–1.2)
Total Protein: 7.8 g/dL (ref 6.5–8.1)

## 2021-07-17 MED ORDER — ACETAMINOPHEN 160 MG/5ML PO SOLN: 650.0000 mg | ORAL | Status: DC | PRN

## 2021-07-17 MED ORDER — ACETAMINOPHEN 325 MG PO TABS: 650.0000 mg | ORAL_TABLET | ORAL | Status: DC | PRN

## 2021-07-17 MED ORDER — SENNOSIDES-DOCUSATE SODIUM 8.6-50 MG PO TABS: 1.0000 | ORAL_TABLET | Freq: Every evening | ORAL | Status: DC | PRN

## 2021-07-17 MED ORDER — STROKE: EARLY STAGES OF RECOVERY BOOK
Freq: Once | Status: AC
  Administered 2021-07-17: 1
  Filled 2021-07-17: qty 1

## 2021-07-17 MED ORDER — IOHEXOL 350 MG/ML SOLN
75.0000 mL | Freq: Once | INTRAVENOUS | Status: AC | PRN
  Administered 2021-07-17: 75 mL via INTRAVENOUS

## 2021-07-17 MED ORDER — ACETAMINOPHEN 650 MG RE SUPP: 650.0000 mg | RECTAL | Status: DC | PRN

## 2021-07-17 NOTE — ED Notes (Signed)
Bed placement stated pt will remain on same floor they should have enough staff at 2300.

## 2021-07-17 NOTE — ED Notes (Signed)
Patient transported to MRI 

## 2021-07-17 NOTE — ED Notes (Signed)
Pt ambulated to the restroom with steady gait. 

## 2021-07-17 NOTE — ED Notes (Signed)
Attempted to give report x2. I was placed on hold and nobody never answered call.

## 2021-07-17 NOTE — ED Notes (Signed)
Attempted to give report x1. I was informed floor was capped and to call bed placement.

## 2021-07-17 NOTE — ED Provider Notes (Signed)
[facility] EMERGENCY DEPARTMENT Provider Note   CSN: [id number] Arrival date & time: 07/17/21  1230     History Chief Complaint  Patient presents with   Eye Problem    Ryan Savage is a 87 y.o. male.  HPI [name] is a 87 y.o. male with medical history significant for chronic atrial fibrillation not on anticoagulation due to prior SDH x3 in 2019 and frequent falls, [name], chronic systolic CHF (last EF [id number] 05/17/2020), history of CVA, HTN, HLD, BPH, and OSA on CPAP   Patient endorses blurred vision on the right side of his visual field since Sunday evening now greater than 48 hours ago.  Denies any weakness numbness slurred speech confusion.  Denies any chest pain shortness of breath lightheadedness or dizziness.  Denies any other significant associated symptoms.  No aggravating mitigating factors.  Has taken no medications prior to arrival.  He called his primary care doctor who referred him to the emergency room and he came today.     Past Medical History:  Diagnosis Date   Aortic stenosis 05/22/2018   Mild with mean AVG 19mHg by echo 05/2018   Atrial fibrillation, chronic (HCC)    Not on anticoagulation due to history of bilateral subdural bleeds due to recurrent falls   BPH (benign prostatic hypertrophy)    [name] (coronary artery disease), native coronary artery    Severe two-vessel [name] with chronically occluded LAD and RCA with widely patent left main, intermediate, and left circumflex branches.  On medical management.  He has extensive collaterals.   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 fib with RVR.  Repeat echo 05/2018 with EF 45-50%.   [name] (degenerative joint disease)    GERD (gastroesophageal reflux disease)    Hyperlipidemia    LDL goal less then 100   Hypertension    Microscopic hematuria    Dr  DDiona Fanti  Mitral valve prolapse    with mild MR   OSA (obstructive sleep apnea)    Pulmonary HTN (HCC)    PASP 59mg by echo 05/2018 but normal pressures on right heart cath.   SDH (subdural hematoma) (HCC) 01/2018   SIADH (syndrome of inappropriate ADH production) ([facility]   Skin cancer    "side of my nose" (11/19/2017)   Stroke ([facility]02/2019   "fully recovered"   Thrombocytopenia ([facility] [place]   Mild- platelet count 143,000 on 02/2011, stable 08/2011    Patient Active Problem List   Diagnosis Date Noted   Acute CVA (cerebrovascular accident) (HC[facility]04/29/2022   Aortic stenosis 05/22/2018   Pulmonary HTN (HC[facility]07/10/2018   Subdural hemorrhage (HC[facility]05/15/2019   Subdural hematoma without coma ([facility]   Seizure prophylaxis    Insomnia    Thrombocytopenia ([facility]   History of subdural hematoma    Tachypnea    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 (HCH[facility]05/08/2018   Protein-calorie malnutrition, severe 03/20/2018   Traumatic subdural hematoma (HCH[facility]04/16/2019   Acute blood loss anemia    Steroid-induced hyperglycemia    SIADH [id number] Chronic systolic CHF (congestive heart failure), NYHA class 2 (HCSim[facility]04/10/2018   Subdural hematoma (HC[facility]04/10/2018   SDH (subdural hematoma) ([facility]03/17/2019   Bilateral subdural hematomas ([facility]03/15/2019   Malnutrition of moderate degree 12/10/2017   Cerebral embolism with cerebral infarction 12/09/2017  Odynophagia 12/08/2017   Syncope and collapse 12/08/2017   Hyperlipidemia 12/08/2017   OSA (obstructive sleep apnea) 12/08/2017   Dysarthria 12/08/2017   [name] (coronary artery disease) 12/08/2017   Syndrome of inappropriate ADH (SIADH) secretion (HCC) 12/08/2017   SIADH (syndrome of inappropriate ADH production) ([place])    Subclinical hyperthyroidism    Hyponatremia with excess extracellular fluid volume 11/18/2017   Hyperglycemia 11/18/2017   Supratherapeutic INR 11/18/2017   Mitral valve prolapse    Permanent atrial  fibrillation ([place]) 11/08/2013   Benign essential HTN 11/08/2013   Mitral regurgitation 11/08/2013    Past Surgical History:  Procedure Laterality Date   APPENDECTOMY  1950   BACK SURGERY     BURR HOLE Bilateral 01/25/2018   Procedure: Haskell Flirt;  Surgeon: Kary Kos, MD;  Location: [facility];  Service: Neurosurgery;  Laterality: Bilateral;   BURR HOLE Left 02/20/2018   Procedure: BURR HOLES for subdural hematoma;  Surgeon: Newman Pies, MD;  Location: [facility];  Service: Neurosurgery;  Laterality: Left;   BURR HOLE Left 03/20/2018   Procedure: Craniotomy for subdural hematoma;  Surgeon: Kristeen Miss, MD;  Location: [facility];  Service: Neurosurgery;  Laterality: Left;   FOREARM FRACTURE SURGERY Right [place]   ruptured disc repair   RIGHT/LEFT HEART CATH AND CORONARY ANGIOGRAPHY N/A 11/21/2017   Procedure: RIGHT/LEFT HEART CATH AND CORONARY ANGIOGRAPHY;  Surgeon: Sherren Mocha, MD;  Location: [facility];  Service: Cardiovascular;  Laterality: N/A;   SKIN CANCER EXCISION Left    "side of my nose"       Family History  Problem Relation Age of Onset   Heart failure Mother    Heart attack Father    [name] Father    Heart attack Brother    [name] Brother    Colon cancer Brother    Colon cancer Sister     Social History   Tobacco Use   Smoking status: Former    Years: 3.00    Types: Cigarettes    Quit date: 1958    Years since quitting: 64.7   Smokeless tobacco: Never  Vaping Use   Vaping Use: Never used  Substance Use Topics   Alcohol use: No   Drug use: No    Home Medications Prior to Admission medications   Medication Sig Start Date End Date Taking? Authorizing Provider  amLODipine (NORVASC) 2.5 MG tablet Take 1 tablet (2.5 mg total) by mouth every evening. 03/28/21  Yes Turner, Eber Hong, MD  ascorbic acid (VITAMIN C) 500 MG tablet Take 500 mg by mouth daily.   Yes [provider]  acetaminophen (TYLENOL) 325 MG tablet Take 1-2 tablets  (325-650 mg total) by mouth every 4 (four) hours as needed for mild pain. 04/03/18   Love, Ivan Anchors, PA-C  aspirin EC 81 MG tablet Take 1 tablet (81 mg total) by mouth daily. Swallow whole. 03/10/21 03/10/22  Caren Griffins, MD  Cholecalciferol (VITAMIN D3) 50 MCG (2000 UT) TABS Take 2,000 Units by mouth every other day.    [provider]  digoxin (LANOXIN) 0.125 MG tablet Take 1 tablet (0.125 mg total) by mouth daily. 03/28/21   Sueanne Margarita, MD  finasteride (PROSCAR) 5 MG tablet Take 1 tablet (5 mg total) by mouth daily. 03/05/18   Love, Ivan Anchors, PA-C  Melatonin 3 MG TABS Take 3 mg by mouth at bedtime.    [provider]  metoprolol succinate (TOPROL-XL) 25 MG 24 hr  tablet Take 1 tablet (25 mg total) by mouth daily. 03/28/21   Sueanne Margarita, MD  Multiple Vitamin (MULTIVITAMIN WITH MINERALS) TABS tablet Take 1 tablet by mouth daily. 04/04/18   Love, Ivan Anchors, PA-C  Multiple Vitamins-Minerals (ZINC PO) Take 1 tablet by mouth daily.    [provider]  Quercetin 250 MG TABS Take 500 mg by mouth daily.    [provider]  simvastatin (ZOCOR) 10 MG tablet Take 1 tablet (10 mg total) by mouth every evening. 03/28/21   Sueanne Margarita, MD    Allergies    Methylphenidate derivatives, Warfarin sodium, and Novocain [procaine]  Review of Systems   Review of Systems  Constitutional:  Negative for chills and fever.  HENT:  Negative for congestion.   Eyes:  Negative for pain.  Respiratory:  Negative for cough and shortness of breath.   Cardiovascular:  Negative for chest pain and leg swelling.  Gastrointestinal:  Negative for abdominal distention, abdominal pain and vomiting.  Genitourinary:  Negative for dysuria.  Musculoskeletal:  Negative for myalgias.  Skin:  Negative for rash.  Neurological:  Negative for dizziness and headaches.       Blurred vision right   Physical Exam Updated Vital Signs BP (!) 147/93   Pulse 81   Temp 97.9 F (36.6 C)   Resp 18    Ht '5\' 10"'$  (1.778 m)   Wt 78.9 kg   SpO2 96%   BMI 24.96 kg/m   Physical Exam Vitals and nursing note reviewed.  Constitutional:      General: He is not in acute distress. HENT:     Head: Normocephalic and atraumatic.     Nose: Nose normal.  Eyes:     General: No scleral icterus. Cardiovascular:     Rate and Rhythm: Normal rate and regular rhythm.     Pulses: Normal pulses.     Heart sounds: Normal heart sounds.  Pulmonary:     Effort: Pulmonary effort is normal. No respiratory distress.     Breath sounds: No wheezing.  Abdominal:     Palpations: Abdomen is soft.     Tenderness: There is no abdominal tenderness.  Musculoskeletal:     Cervical back: Normal range of motion.     Right lower leg: No edema.     Left lower leg: No edema.  Skin:    General: Skin is warm and dry.     Capillary Refill: Capillary refill takes less than 2 seconds.     Comments: Alert and oriented to self, place, time and event.   Speech is fluent, clear without dysarthria or dysphasia.   Strength 5/5 in upper/lower extremities   Sensation intact in upper/lower extremities   Normal gait.  Negative Romberg. No pronator drift.  Normal finger-to-nose and feet tapping.  CN I not tested  CN II right with temporal hemianopsia no deficit in left eye. CN III, IV, VI PERRLA and EOMs intact bilaterally  CN V Intact sensation to sharp and light touch to the face  CN VII facial movements symmetric  CN VIII not tested  CN IX, X no uvula deviation, symmetric rise of soft palate  CN XI 5/5 SCM and trapezius strength bilaterally  CN XII Midline tongue protrusion, symmetric L/R movements     Neurological:     Mental Status: He is alert. Mental status is at baseline.  Psychiatric:        Mood and Affect: Mood normal.  Behavior: Behavior normal.    ED Results / Procedures / Treatments   Labs (all labs ordered are listed, but only abnormal results are displayed) Labs Reviewed  CBC WITH  DIFFERENTIAL/PLATELET - Abnormal; Notable for the following components:      Result Value   RBC 4.11 (*)    Hemoglobin 12.9 (*)    HCT 38.2 (*)    Platelets 146 (*)    All other components within normal limits  COMPREHENSIVE METABOLIC PANEL - Abnormal; Notable for the following components:   Sodium 128 (*)    Chloride 93 (*)    Glucose, Bld 117 (*)    Total Bilirubin 1.5 (*)    All other components within normal limits    EKG EKG Interpretation  Date/Time:  Tuesday July 17 2021 13:50:45 EDT Ventricular Rate:  73 PR Interval:    QRS Duration: 101 QT Interval:  409 QTC Calculation: 451 R Axis:   -22 Text Interpretation: Atrial fibrillation Ventricular premature complex Borderline left axis deviation Low voltage, precordial leads Consider anterior infarct Minimal ST depression, lateral leads since last tracing no significant change Confirmed by Daleen Bo [phone]) on 07/17/2021 2:18:37 PM  Radiology MR BRAIN WO CONTRAST  Result Date: 07/17/2021 CLINICAL DATA:  Neuro deficit, acute, stroke suspected. Additional history provided: Right eye vision abnormality which began on Saturday. EXAM: MRI HEAD WITHOUT CONTRAST TECHNIQUE: Multiplanar, multiecho pulse sequences of the brain and surrounding structures were obtained without intravenous contrast. COMPARISON:  MRI brain 03/09/2021, MRA head 03/10/2021. Head CT 03/09/2021. FINDINGS: Brain: Intermittently motion degraded examination. Most notably, there is moderate motion degradation of the sagittal T1 weighted sequence, moderate motion degradation of the coronal T2 TSE sequence. Moderate generalized cerebral atrophy. Large acute cortical/subcortical infarct within the left PCA vascular territory, predominantly within the left temporal and occipital lobes. Mild petechial hemorrhage within portions of the infarction territory. Redemonstrated chronic cortically based infarcts within the left frontal lobe/insula. Mild multifocal T2/FLAIR  hyperintensity within the white matter, nonspecific but compatible with chronic small vessel ischemic disease. Small chronic infarcts within the bilateral cerebellar hemispheres and chronic hemorrhage within the right cerebellar hemisphere, unchanged. No evidence of an intracranial mass. No extra-axial fluid collection. No midline shift. Vascular: No definite loss of proximal arterial flow voids appreciated. Skull and upper cervical spine: No focal suspicious marrow lesion. Left-sided cranioplasty. Bilateral frontoparietal burr holes. Sinuses/Orbits: Visualized orbits show no acute finding. No significant paranasal sinus disease at the imaged levels. IMPRESSION: Motion degraded exam. Large acute/early subacute left PCA territory cortical/subcortical infarct, predominantly within the left temporal and occipital lobes. Mild petechial hemorrhage within portions of the infarction territory. Redemonstrated chronic cortically based infarcts within the left frontal lobe/insula. Background mild chronic small vessel ischemic changes within the cerebral white matter. Redemonstrated small chronic infarcts within the bilateral cerebellar hemispheres, and chronic hemorrhage within the right cerebellar hemisphere, unchanged. Moderate generalized cerebral atrophy. Electronically Signed   By: Kellie Simmering D.O.   On: 07/17/2021 15:16    Procedures Procedures   Medications Ordered in ED Medications - No data to display  ED Course  I have reviewed the triage vital signs and the nursing notes.  Pertinent labs & imaging results that were available during my care of the patient were reviewed by me and considered in my medical decision making (see chart for details).  Clinical Course as of 07/17/21 1844  Tue Jul 17, 2021  1403 Discussed with Dr. Earnestine Leys of neurology.  [WF]  1539 MRI brain shows stroke IMPRESSION: Motion  degraded exam.   Large acute/early subacute left PCA territory cortical/subcortical infarct,  predominantly within the left temporal and occipital lobes. Mild petechial hemorrhage within portions of the infarction territory.   Redemonstrated chronic cortically based infarcts within the left frontal lobe/insula.   Background mild chronic small vessel ischemic changes within the cerebral white matter.   Redemonstrated small chronic infarcts within the bilateral cerebellar hemispheres, and chronic hemorrhage within the right cerebellar hemisphere, unchanged.   Moderate generalized cerebral atrophy. [WF]  1557 Discussed  with Dr. Earnestine Leys of neurology.  Will evaluate the pt.  [WF]  36 Discussed with Dr. Rogers Blocker of internal medicine.  [WF]    Clinical Course User Index [WF] Tedd Sias, PA   MDM Rules/Calculators/A&P                           Patient is 87 year old male with past medical history detailed in HPI notably positive for A. fib.  He is on digoxin however is complaining of symptoms that are not particularly consistent with digoxin toxicity/digoxin related vision problems.  Patient is neurologically intact but does have right visual field deficit.  Confounding only it is only in the right eye seemingly.  Discussed with Dr. Cheral Marker of neurology.  Will obtain basic labs and MRI.  MRI shows stroke.  EKG confirms consistent A. fib.  CBC relatively unremarkable.  CMP notable for mild hyponatremia.  Patient mended to hospitalist service.  Final Clinical Impression(s) / ED Diagnoses Final diagnoses:  Cerebrovascular accident (CVA), unspecified mechanism [facility])    Rx / DC Orders ED Discharge Orders     None        Tedd Sias, [place] 07/17/21 1845    Daleen Bo, MD 07/18/21 [phone]

## 2021-07-17 NOTE — Consult Note (Signed)
Referring Physician: Dr. Eulis Foster    Chief Complaint: New onset of right sided visual field deficit  HPI: Ryan Savage is a 87 y.o. male with a medical history significant for chronic atrial fibrillation not on anticoagulation due to frequent falls and SDH x 3 in 2019, coronary artery disease, chronic systolic heart failure, hyperlipidemia, essential hypertension, OSA on CPAP, and recent stroke in April 2022 with dysarthria that reportedly resolved who presented to the ED 07/17/2021 for evaluation of right visual field deficit with a reported onset of Saturday, September 3 when he woke from sleeping at 07:00. He states that he has been unable to read the newspaper because half of the page disappears while he is reading. He does report vision that looks like a "piece of wood going through it" with forced rightward and upward gaze and vision in the right eye that "slides away and comes back" however, he is unable to further describe these visual sensations to examiner. He also complains of word-finding difficulties in conversation.  Of note, patient was last admitted on 03/09/2021 with dysarthria and an MRI brain was obtained with evidence of several left MCA punctate cortical infarctions with an embolic pattern thought to be secondary to atrial fibrillation without anticoagulation versus left ICA moderate stenosis identified on vessel imaging. He was discharged on aspirin 81 mg PO daily as he could not be anticoagulated due to severe SDH in 2019 due to fall and right cerebellar ICH in 2019. He endorses compliance with aspirin therapy but states he is unsure if he takes his aspirin every day at home.   LKW: 07/13/2021 22:00 TNK given?: no, well outside of the window for thrombolytic therapy  IR Thrombectomy? No, last known well over 3 days ago. Stroke is completed on arrival.  Modified Rankin Scale: 1-No significant post stroke disability and can perform usual duties with stroke symptoms  Past Medical History:   Diagnosis Date   Aortic stenosis 05/22/2018   Mild with mean AVG 44mHg by echo 05/2018   Atrial fibrillation, chronic (HCC)    Not on anticoagulation due to history of bilateral subdural bleeds due to recurrent falls   BPH (benign prostatic hypertrophy)    [name] (coronary artery disease), native coronary artery    Severe two-vessel [name] with chronically occluded LAD and RCA with widely patent left main, intermediate, and left circumflex branches.  On medical management.  He has extensive collaterals.   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 fib with RVR.  Repeat echo 05/2018 with EF 45-50%.   [name] (degenerative joint disease)    GERD (gastroesophageal reflux disease)    Hyperlipidemia    LDL goal less then 100   Hypertension    Microscopic hematuria    Dr DDiona Fanti  Mitral valve prolapse    with mild MR   OSA (obstructive sleep apnea)    Pulmonary HTN (HCC)    PASP 569mg by echo 05/2018 but normal pressures on right heart cath.   SDH (subdural hematoma) ([facility]   SIADH (syndrome of inappropriate ADH production) ([facility]   Skin cancer    "side of my nose" (11/19/2017)   [place]   "fully recovered"   Thrombocytopenia ([facility]   Mild- platelet count 143,000 on 02/2011, stable 08/2011   Past Surgical History:  Procedure Laterality Date   APPENDECTOMY  1950  BACK SURGERY     BURR HOLE Bilateral 01/25/2018   Procedure: Haskell Flirt;  Surgeon: Kary Kos, MD;  Location: [facility];  Service: Neurosurgery;  Laterality: Bilateral;   BURR HOLE Left 02/20/2018   Procedure: BURR HOLES for subdural hematoma;  Surgeon: Newman Pies, MD;  Location: [facility];  Service: Neurosurgery;  Laterality: Left;   BURR HOLE Left 03/20/2018   Procedure: Craniotomy for subdural hematoma;  Surgeon: Kristeen Miss, MD;  Location: [facility];  Service: Neurosurgery;   Laterality: Left;   FOREARM FRACTURE SURGERY Right [place]   ruptured disc repair   RIGHT/LEFT HEART CATH AND CORONARY ANGIOGRAPHY N/A 11/21/2017   Procedure: RIGHT/LEFT HEART CATH AND CORONARY ANGIOGRAPHY;  Surgeon: Sherren Mocha, MD;  Location: [facility];  Service: Cardiovascular;  Laterality: N/A;   SKIN CANCER EXCISION Left    "side of my nose"   Family History  Problem Relation Age of Onset   Heart failure Mother    Heart attack Father    [name] Father    Heart attack Brother    [name] Brother    Colon cancer Brother    Colon cancer Sister    Social History:  reports that he quit smoking about 64 years ago. His smoking use included cigarettes. He has never used smokeless tobacco. He reports that he does not drink alcohol and does not use drugs.  Allergies:  Allergies  Allergen Reactions   Methylphenidate Derivatives Other (See Comments)    Restless leg   Warfarin Sodium     Other reaction(s): subdural   Novocain [Procaine] Rash    Medications: I have reviewed the patient's current medications. Current Outpatient Medications  Medication Instructions   acetaminophen (TYLENOL) 325-650 mg, Oral, Every 4 hours PRN   amLODipine (NORVASC) 2.5 mg, Oral, Every evening   ascorbic acid (VITAMIN C) 500 mg, Oral, Daily   aspirin EC 81 mg, Oral, Daily, Swallow whole.   digoxin (LANOXIN) 0.125 mg, Oral, Daily   finasteride (PROSCAR) 5 mg, Oral, Daily   melatonin 3 mg, Oral, Daily at bedtime   metoprolol succinate (TOPROL-XL) 25 mg, Oral, Daily   Multiple Vitamin (MULTIVITAMIN WITH MINERALS) TABS tablet 1 tablet, Oral, Daily   Multiple Vitamins-Minerals (ZINC PO) 1 tablet, Oral, Daily   Quercetin 500 mg, Oral, Daily   simvastatin (ZOCOR) 10 mg, Oral, Every evening   Vitamin D3 2,000 Units, Oral, Every other day   ROS: A complete ROS was performed and is negative except as noted in the HPI.   Physical Examination: Blood pressure (!) 145/96, pulse  61, temperature 97.9 F (36.6 C), resp. rate (!) 22, height '5\' 10"'$  (1.778 m), weight 78.9 kg, SpO2 94 %.  Constitutional: Appears well-developed and well-nourished, laying comfortably in the ER stretcher, in no acute distress Psych: Affect appropriate to situation, he is calm and cooperative with examination Eyes: No scleral injection, normal conjunctivae, wears eyeglasses at baseline, arcus senilis present bilaterally HENT: No OP obstruction, normocephalic and atraumatic, without obvious abnormality MSK: no joint deformities, swelling, or tenderness Cardiovascular: Irregular rate and rhythm on cardiac monitor, atrial fibrillation, rate controlled Respiratory: Effort normal, non-labored breathing on room air GI: Soft to palpation throughout.  No distension. There is no tenderness.  Skin: WDI without suspicious lesions or rashes  Neuro: Mental Status: Patient is awake, alert, oriented to person and place. He states that he is in the hospital but incorrectly states that he is at [facility]. He does have some  evidence of confusion as he intermittently states "I am 87 years old" and "I am 87 years old" interchangeably. He incorrectly states that the year is 68 and that the month is May and corrects to state that the month is June.  Speech is mildly dysarthric which patient states is abnormal for him and has not persisted since previous stroke.  Naming is not fully intact and patient intermittently has trouble with word-finding during formal evaluation and during normal conversation. He correctly names 3/6 objects and the other 3 he perseverates on "radio".  Patient is able to give a clear and coherent history of present illness but is unable to articulate some of his visual disturbances accurately to examiner.  No signs of neglect noted.  Cranial Nerves: II: Pupils are equal, round, and reactive to light. Patient with evidence of a right hemianopsia bilaterally. There is partial hemianopsia in  the superior and inferior visual fields OD (though patient is inconsistent with reporting of visual fields throughout examination) III,IV, VI: EOMI without ptosis or diploplia.  V: Facial sensation is symmetric to light touch VII: Facial movement is symmetric resting and smiling VIII: Hearing is intact to voice X: Palate elevates symmetrically XI: Shoulder shrug is symmetric XII: Tongue protrudes midline without atrophy or fasciculations.  Motor: Tone is normal. Bulk is normal.  5/5 strength was present in all four extremities without vertical drift.  Sensory: Sensation is symmetric to light touch and temperature in the arms and legs. No extinction to DSS present.  Deep Tendon Reflexes: 3+ and symmetric in the biceps and patellae.  Plantars: Toes are downgoing bilaterally. Cerebellar: FNF and HKS are intact bilaterally  1a Level of Conscious.: 0 1b LOC Questions: 1 1c LOC Commands: 0 2 Best Gaze: 0 3 Visual: 2 4 Facial Palsy: 0 5a Motor Arm - left: 0 5b Motor Arm - Right: 0 6a Motor Leg - Left: 0 6b Motor Leg - Right: 0 7 Limb Ataxia: 0 8 Sensory: 0 9 Best Language: 1 10 Dysarthria: 1 11 Extinct. and Inatten.: 0 TOTAL: 5  Results for orders placed or performed during the hospital encounter of 07/17/21 (from the past 48 hour(s))  CBC with Differential/Platelet     Status: Abnormal   Collection Time: 07/17/21  1:31 PM  Result Value Ref Range   WBC 7.6 4.0 - 10.5 K/uL   RBC 4.11 (L) 4.22 - 5.81 MIL/uL   Hemoglobin 12.9 (L) 13.0 - 17.0 g/dL   HCT 38.2 (L) 39.0 - 52.0 %   MCV 92.9 80.0 - 100.0 fL   MCH 31.4 26.0 - 34.0 pg   MCHC 33.8 30.0 - 36.0 g/dL   RDW 12.7 11.5 - 15.5 %   Platelets 146 (L) 150 - 400 K/uL   nRBC 0.0 0.0 - 0.2 %   Neutrophils Relative % 59 %   Neutro Abs 4.5 1.7 - 7.7 K/uL   Lymphocytes Relative 27 %   Lymphs Abs 2.1 0.7 - 4.0 K/uL   Monocytes Relative 13 %   Monocytes Absolute 1.0 0.1 - 1.0 K/uL   Eosinophils Relative 0 %   Eosinophils  Absolute 0.0 0.0 - 0.5 K/uL   Basophils Relative 1 %   Basophils Absolute 0.1 0.0 - 0.1 K/uL   Immature Granulocytes 0 %   Abs Immature Granulocytes 0.02 0.00 - 0.07 K/uL    Comment: Performed at [facility], [address], [place],  [address]  Comprehensive metabolic panel     Status: Abnormal   Collection Time: 07/17/21  1:31 PM  Result Value Ref Range   Sodium 128 (L) 135 - 145 mmol/L   Potassium 4.0 3.5 - 5.1 mmol/L   Chloride 93 (L) 98 - 111 mmol/L   CO2 24 22 - 32 mmol/L   Glucose, Bld 117 (H) 70 - 99 mg/dL    Comment: Glucose reference range applies only to samples taken after fasting for at least 8 hours.   BUN 16 8 - 23 mg/dL   Creatinine, Ser 0.93 0.61 - 1.24 mg/dL   Calcium 9.3 8.9 - 10.3 mg/dL   Total Protein 7.8 6.5 - 8.1 g/dL   Albumin 3.8 3.5 - 5.0 g/dL   AST 27 15 - 41 U/L   ALT 16 0 - 44 U/L   Alkaline Phosphatase 44 38 - 126 U/L   Total Bilirubin 1.5 (H) 0.3 - 1.2 mg/dL   GFR, Estimated >60 >60 mL/min    Comment: (NOTE) Calculated using the CKD-EPI Creatinine Equation (2021)    Anion gap 11 5 - 15    Comment: Performed at [facility] [address], [place], [place] [address]   MR BRAIN WO CONTRAST  Result Date: 07/17/2021 CLINICAL DATA:  Neuro deficit, acute, stroke suspected. Additional history provided: Right eye vision abnormality which began on Saturday. EXAM: MRI HEAD WITHOUT CONTRAST TECHNIQUE: Multiplanar, multiecho pulse sequences of the brain and surrounding structures were obtained without intravenous contrast. COMPARISON:  MRI brain 03/09/2021, MRA head 03/10/2021. Head CT 03/09/2021. FINDINGS: Brain: Intermittently motion degraded examination. Most notably, there is moderate motion degradation of the sagittal T1 weighted sequence, moderate motion degradation of the coronal T2 TSE sequence. Moderate generalized cerebral atrophy. Large acute cortical/subcortical infarct within the left PCA vascular territory, predominantly within the  left temporal and occipital lobes. Mild petechial hemorrhage within portions of the infarction territory. Redemonstrated chronic cortically based infarcts within the left frontal lobe/insula. Mild multifocal T2/FLAIR hyperintensity within the white matter, nonspecific but compatible with chronic small vessel ischemic disease. Small chronic infarcts within the bilateral cerebellar hemispheres and chronic hemorrhage within the right cerebellar hemisphere, unchanged. No evidence of an intracranial mass. No extra-axial fluid collection. No midline shift. Vascular: No definite loss of proximal arterial flow voids appreciated. Skull and upper cervical spine: No focal suspicious marrow lesion. Left-sided cranioplasty. Bilateral frontoparietal burr holes. Sinuses/Orbits: Visualized orbits show no acute finding. No significant paranasal sinus disease at the imaged levels. IMPRESSION: Motion degraded exam. Large acute/early subacute left PCA territory cortical/subcortical infarct, predominantly within the left temporal and occipital lobes. Mild petechial hemorrhage within portions of the infarction territory. Redemonstrated chronic cortically based infarcts within the left frontal lobe/insula. Background mild chronic small vessel ischemic changes within the cerebral white matter. Redemonstrated small chronic infarcts within the bilateral cerebellar hemispheres, and chronic hemorrhage within the right cerebellar hemisphere, unchanged. Moderate generalized cerebral atrophy. Electronically Signed   By: Kellie Simmering D.O.   On: 07/17/2021 15:16    Assessment: 87 y.o. male presenting with a prior history of stroke, presenting with new onset of right sided visual field deficit, confusion, and word-finding difficulties. MRI brain reveals a large left occipital lobe ischemic infarction. Initial symptoms of stroke were first noticed when he woke from sleep Saturday morning.  1. Examination reveals patient with an NIHSS of 5 for  right hemianopia, confusion, dysarthria, and mild aphasia. There is some limitation to examination by inconsistent reporting during visual field testing with evidence of some superior and inferior right visual field deficits OD, as well as nasal hemifield  defect OS.   2. Stroke Risk Factors - atrial fibrillation, carotid stenosis, hyperlipidemia, and hypertension. Also patient with history of stroke, and AF without AC due to previous severe SDH and ICH in 2019 with frequent falls.  3. Stroke etiology likely known atrial fibrillation without anticoagulation versus known left ICA stenosis.  4. Most recent echocardiogram was performed in April of this year. No mural thrombus or valvular vegetation mentioned in the report. Left ventricular ejection fraction was 45 to 50%. The left ventricle demonstrated global hypokinesis. Left atrial size was severely dilated  Recommendations: 1. HgbA1c, fasting lipid panel 2. CT angio head and neck 3. PT consult, OT consult, Speech consult 4. Prophylactic therapy-Antiplatelet med: Aspirin - dose 81 mg with further recommendations per stroke team with history of AF not on AC 5. Risk factor modification 6. Telemetry monitoring 7. Frequent neuro checks 8. BP management  '@Electronically'$  signed: Dr. Kerney Elbe  07/17/2021, 3:58 PM

## 2021-07-17 NOTE — ED Triage Notes (Signed)
Started Saturday in the right eye he is seeing shadows and movement.  Denies any other symptoms.  Otherwise pt feels fine his MD advised him to come here.

## 2021-07-17 NOTE — ED Notes (Signed)
Attempted to give report x3. Secretary stated the CN asked to have RN callback.

## 2021-07-17 NOTE — H&P (Addendum)
History and Physical    Ryan Savage [id number] DOB: 04-03-1929 DOA: 07/17/2021  PCP: Ryan Manes, MD Consultants:  cardiology: [name]  Patient coming from:  Home - lives with his wife   Chief Complaint: vision changes/confusion   HPI: Ryan Savage is a 87 y.o. male with medical history significant of atrial fibrillation not on anticoagulation due to falls, HTN, HLD, hx of CVA in 4/22, OSA, [name], SIADH, systolic CHF, SH x 3 in [id number] who presented with vision changes. Symptoms started on Saturday morning. He woke up dizzy. He states his eyes started to "look inward." He would look up and see things like stars. He was unable to read the newspaper because 1/2 the page would go away on the right side and he was unable to read the full sentence. He states his eye was not stuck in anyway and no movement problems. History is difficult. He states if he looks to the right he will see an "image." I asked him what happened on Sunday-Tuesday. He then tells me something about a calendar and history doesn't make much sense. He denies any motor weakness, facial drooping, blurred vision or double vision.   His wife tells me on Saturday he wanted to drive down [address]. She got in the truck and he asked her, " how do you get to [address]?" They have lived here since 1966. She knew he was confused. He was driving, but had a hard time turning right and staying in right lane. She knew something was wrong. She was able to have him turn off the road and she took over driving. She thinks he may have been more confused. He was able to go for a walk on Sunday.   His wife told me they waited until the doctor's office opened today and they told him to go to the ER.   He is on ASA that he states he takes daily.  LKW: 07/13/21, "sometime in evening"   He denies any recent fever, chills, shortness of breath, cough, chest pain, palpitations, abdominal pain, nausea vomiting or diarrhea.  He has had no leg swelling  or weight gain.  ED Course: vitals: Afebrile, blood pressure 150/78, heart rate 71, respiratory rate 20, oxygen 98% on room air Pertinent labs: Sodium 128 at baseline.  MRI brain large acute/early subacute left PCA territory cortical/subcortical infarct predominantly within the left temporal and occipital lobes mild petechial hemorrhage within portions of the infarction territory Neurology was consulted [facility] was asked to admit.  Review of Systems: As per HPI; otherwise review of systems reviewed and negative.   Ambulatory Status:  Ambulates without assistance   Past Medical History:  Diagnosis Date   Aortic stenosis 05/22/2018   Mild with mean AVG 25mHg by echo 05/2018   Atrial fibrillation, chronic (HCC)    Not on anticoagulation due to history of bilateral subdural bleeds due to recurrent falls   BPH (benign prostatic hypertrophy)    [name] (coronary artery disease), native coronary artery    Severe two-vessel [name] with chronically occluded LAD and RCA with widely patent left main, intermediate, and left circumflex branches.  On medical management.  He has extensive collaterals.   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 fib with RVR.  Repeat echo 05/2018 with EF 45-50%.   [name] (degenerative joint disease)    GERD (gastroesophageal  reflux disease)    Hyperlipidemia    LDL goal less then 100   Hypertension    Microscopic hematuria    [name]   Mitral valve prolapse    with mild MR   OSA (obstructive sleep apnea)    Pulmonary HTN (HCC)    PASP 25mHg by echo 05/2018 but normal pressures on right heart cath.   SDH (subdural hematoma) ([facility] 01/2018   SIADH (syndrome of inappropriate ADH production) ([facility]    Skin cancer    "side of my nose" (11/19/2017)   Stroke ([facility] 12/2017   "fully recovered"   Thrombocytopenia ([facility]    Mild- platelet count  143,000 on 02/2011, stable 08/2011    Past Surgical History:  Procedure Laterality Date   APPENDECTOMY  1950   BACK SURGERY     BURR HOLE Bilateral 01/25/2018   Procedure: BHaskell Flirt  Surgeon: CKary Kos MD;  [facility]  Service: Neurosurgery;  Laterality: Bilateral;   BURR HOLE Left 02/20/2018   Procedure: BURR HOLES for subdural hematoma;  Surgeon: JNewman Pies MD;  [facility]  Service: Neurosurgery;  Laterality: Left;   BURR HOLE Left 03/20/2018   Procedure: Craniotomy for subdural hematoma;  Surgeon: EKristeen Miss MD;  [facility]  Service: Neurosurgery;  Laterality: Left;   FOREARM FRACTURE SURGERY Right [place]  ruptured disc repair   RIGHT/LEFT HEART CATH AND CORONARY ANGIOGRAPHY N/A 11/21/2017   Procedure: RIGHT/LEFT HEART CATH AND CORONARY ANGIOGRAPHY;  Surgeon: CSherren Mocha MD;  [facility];  Service: Cardiovascular;  Laterality: N/A;   SKIN CANCER EXCISION Left    "side of my nose"    Social History   Socioeconomic History   Marital status: Married    Spouse name: Not on file   Number of children: Not on file   Years of education: Not on file   Highest education level: Not on file  Occupational History   Occupation: retired  Tobacco Use   Smoking status: Former    Years: 3.00    Types: Cigarettes    Quit date: 1958    Years since quitting: 64.7   Smokeless tobacco: Never  Vaping Use   Vaping Use: Never used  Substance and Sexual Activity   Alcohol use: No   Drug use: No   Sexual activity: Not on file  Other Topics Concern   Not on file  Social History Narrative   Not on file   Social Determinants of Health   Financial Resource Strain: Not on file  Food Insecurity: Not on file  Transportation Needs: Not on file  Physical Activity: Not on file  Stress: Not on file  Social Connections: Not on file  Intimate Partner Violence: Not on file    Allergies  Allergen Reactions    Methylphenidate Derivatives Other (See Comments)    Restless leg   Warfarin Sodium     Other reaction(s): subdural   Novocain [Procaine] Rash    Family History  Problem Relation Age of Onset   Heart failure Mother    Heart attack Father    [name] Father    Heart attack Brother    [name] Brother    Colon cancer Brother    Colon cancer Sister     Prior to Admission medications   Medication Sig Start Date End Date Taking? Authorizing Provider  amLODipine (NORVASC) 2.5 MG tablet Take 1 tablet (2.5 mg total) by mouth every evening.  03/28/21  Yes Turner, Eber Hong, MD  ascorbic acid (VITAMIN C) 500 MG tablet Take 500 mg by mouth daily.   Yes [provider]  acetaminophen (TYLENOL) 325 MG tablet Take 1-2 tablets (325-650 mg total) by mouth every 4 (four) hours as needed for mild pain. 04/03/18   Love, Ivan Anchors, PA-C  aspirin EC 81 MG tablet Take 1 tablet (81 mg total) by mouth daily. Swallow whole. 03/10/21 03/10/22  Caren Griffins, MD  Cholecalciferol (VITAMIN D3) 50 MCG (2000 UT) TABS Take 2,000 Units by mouth every other day.    [provider]  digoxin (LANOXIN) 0.125 MG tablet Take 1 tablet (0.125 mg total) by mouth daily. 03/28/21   Sueanne Margarita, MD  finasteride (PROSCAR) 5 MG tablet Take 1 tablet (5 mg total) by mouth daily. 03/05/18   Love, Ivan Anchors, PA-C  Melatonin 3 MG TABS Take 3 mg by mouth at bedtime.    [provider]  metoprolol succinate (TOPROL-XL) 25 MG 24 hr tablet Take 1 tablet (25 mg total) by mouth daily. 03/28/21   Sueanne Margarita, MD  Multiple Vitamin (MULTIVITAMIN WITH MINERALS) TABS tablet Take 1 tablet by mouth daily. 04/04/18   Love, Ivan Anchors, PA-C  Multiple Vitamins-Minerals (ZINC PO) Take 1 tablet by mouth daily.    [provider]  Quercetin 250 MG TABS Take 500 mg by mouth daily.    [provider]  simvastatin (ZOCOR) 10 MG tablet Take 1 tablet (10 mg total) by mouth every evening. 03/28/21   Sueanne Margarita, MD     Physical Exam: Vitals:   07/17/21 1330 07/17/21 1625 07/17/21 1630 07/17/21 1700  BP: (!) 145/96 (!) 168/79 (!) 162/79 (!) 147/93  Pulse: 61 66 69 81  Resp: (!) '22 14 18 18  '$ Temp:      SpO2: 94% 99% 97% 96%  Weight:      Height:         General:  Appears calm and comfortable and is in NAD Eyes:  PERRL, EOMI, normal lids, iris ENT:  grossly normal hearing, lips & tongue, mmm; appropriate dentition Neck:  no LAD, masses or thyromegaly; no carotid bruits Cardiovascular:  irregularly, irregular.  systolic murmur. No LE edema.  Respiratory:   CTA bilaterally with no wheezes/rales/rhonchi.  Normal respiratory effort. Abdomen:  soft, NT, ND, NABS Back:   normal alignment, no CVAT Skin:  no rash or induration seen on limited exam Musculoskeletal:  grossly normal tone BUE/BLE, good ROM, no bony abnormality Lower extremity:  No LE edema.  Limited foot exam with no ulcerations.  2+ distal pulses. Psychiatric:  grossly normal mood and affect, speech aphasic, word finding difficulty throughout exam. Knows he is in hospital, states [place]. Knows his wife. Not oriented to date/day/year.  Neurologic:  CN 2-12 grossly intact, moves all extremities in coordinated fashion, sensation intact. DTR wnl, no pronator drift.  History is difficult with eye exam.     Radiological Exams on Admission: Independently reviewed - see discussion in A/P where applicable  MR BRAIN WO CONTRAST  Result Date: 07/17/2021 CLINICAL DATA:  Neuro deficit, acute, stroke suspected. Additional history provided: Right eye vision abnormality which began on Saturday. EXAM: MRI HEAD WITHOUT CONTRAST TECHNIQUE: Multiplanar, multiecho pulse sequences of the brain and surrounding structures were obtained without intravenous contrast. COMPARISON:  MRI brain 03/09/2021, MRA head 03/10/2021. Head CT 03/09/2021. FINDINGS: Brain: Intermittently motion degraded examination. Most notably, there is moderate motion degradation of the  sagittal T1  weighted sequence, moderate motion degradation of the coronal T2 TSE sequence. Moderate generalized cerebral atrophy. Large acute cortical/subcortical infarct within the left PCA vascular territory, predominantly within the left temporal and occipital lobes. Mild petechial hemorrhage within portions of the infarction territory. Redemonstrated chronic cortically based infarcts within the left frontal lobe/insula. Mild multifocal T2/FLAIR hyperintensity within the white matter, nonspecific but compatible with chronic small vessel ischemic disease. Small chronic infarcts within the bilateral cerebellar hemispheres and chronic hemorrhage within the right cerebellar hemisphere, unchanged. No evidence of an intracranial mass. No extra-axial fluid collection. No midline shift. Vascular: No definite loss of proximal arterial flow voids appreciated. Skull and upper cervical spine: No focal suspicious marrow lesion. Left-sided cranioplasty. Bilateral frontoparietal burr holes. Sinuses/Orbits: Visualized orbits show no acute finding. No significant paranasal sinus disease at the imaged levels. IMPRESSION: Motion degraded exam. Large acute/early subacute left PCA territory cortical/subcortical infarct, predominantly within the left temporal and occipital lobes. Mild petechial hemorrhage within portions of the infarction territory. Redemonstrated chronic cortically based infarcts within the left frontal lobe/insula. Background mild chronic small vessel ischemic changes within the cerebral white matter. Redemonstrated small chronic infarcts within the bilateral cerebellar hemispheres, and chronic hemorrhage within the right cerebellar hemisphere, unchanged. Moderate generalized cerebral atrophy. Electronically Signed   By: Kellie Simmering D.O.   On: 07/17/2021 15:16    EKG: Independently reviewed.  afib with rate 73; nonspecific ST changes with no evidence of acute ischemia   Labs on Admission: I have personally  reviewed the available labs and imaging studies at the time of the admission.  Pertinent labs:  Sodium 128 at baseline. (126-133)    Assessment/Plan Principal Problem:   Acute CVA (cerebrovascular accident) ([place]) -87 year old with history of prior strokes presenting with new onset of right sided visual field deficit, confusion and aphasia.  MRI reveals a large acute/early subacute left PCA territory cortical/subcortical infarct. -Likely secondary to atrial fibrillation on no anticoagulation. -frequent neuro checks -place on telemetry -neurology consulted, CT angio head and neck recommended  -continue ASA '81mg'$  daily, follow neurology recommendations -PT/OT/ST eval. Has aphasia on exam  Active Problems:   Permanent atrial fibrillation (HCC) -contine ASA, beta blocker and digoxin. digoxin  Level pending -telemetry  -Rate controlled    Benign essential HTN -Appears well controlled, continue Norvasc and metoprolol daily    SIADH (syndrome of inappropriate ADH production) (HCC) -on no salt tablets -stable, continue to monitor     Hyperlipidemia -lipid panel pending -continue statin. -maximize medical therapy with LDL <70    [name] (coronary artery disease) -continue statin and aspirin    Chronic systolic CHF (congestive heart failure), NYHA class 2 ([place]) Patient is euvolemic Echo in April 2022 shows an EF of 45 to 50%.  Left ventricular has mildly decreased function.  And demonstrates global hypokinesis.  Mild aortic valve stenosis. -Monitor Intake and output Continue medical management    OSA (obstructive sleep apnea) Cpap at night, brought his home cpap.   Body mass index is 24.96 kg/m.   Level of care: Telemetry Medical DVT prophylaxis:  SCDs (ICH in 2019) wife is very nervous about any blood thinners  Code Status:  DNR confirmed with patient Family Communication: wife at bedside: Almetta Lovely  Disposition Plan:  The patient is from: home  Anticipated d/c is to:  per day team Requires inpatient hospitalization and is at significant risk of neurological worsening, requires constant monitoring, assessment and MDM with specialists.   Consults called: neurology  Admission status:  observation  Orma Flaming MD [facility]   How to contact the [facility] Attending or Consulting provider [place] or covering provider during after hours [place], for this patient?  Check the care team in [facility] and look for a) attending/consulting [facility] provider listed and b) the [facility] team listed Log into [email or web address] and use [place] universal password to access. If you do not have the password, please contact the hospital operator. Locate the [facility] provider you are looking for under [facility] and page to a number that you can be directly reached. If you still have difficulty reaching the provider, please page the [facility] (Director on Call) for the Hospitalists listed on amion for assistance.   07/17/2021, 5:27 PM

## 2021-07-17 NOTE — ED Notes (Signed)
Attempted to call spouse per pt. Request

## 2021-07-17 NOTE — ED Provider Notes (Signed)
  Face-to-face evaluation   History: He presents for evaluation of trouble with his right eye, lateral visual field, since 3 days ago.  He denies headache, eye pain, blurred vision.  No prior similar problems.  He is not having any chest pain, palpitations, focal weakness or paresthesia.  He is a somewhat poor historian.   Physical exam: Elderly, alert and cooperative.  He is able to finger count accurately bilaterally.  External ocular muscles are intact.  He describes a red eye only, lateral field cut, both upper and lower; during clinical examination  Medical screening examination/treatment/procedure(s) were conducted as a shared visit with non-physician practitioner(s) and myself.  I personally evaluated the patient during the encounter    Daleen Bo, MD 07/18/21 [id number]

## 2021-07-18 ENCOUNTER — Observation Stay (HOSPITAL_COMMUNITY): Payer: Medicare PPO

## 2021-07-18 DIAGNOSIS — G4733 Obstructive sleep apnea (adult) (pediatric): Secondary | ICD-10-CM | POA: Diagnosis present

## 2021-07-18 DIAGNOSIS — Z9181 History of falling: Secondary | ICD-10-CM | POA: Diagnosis not present

## 2021-07-18 DIAGNOSIS — I272 Pulmonary hypertension, unspecified: Secondary | ICD-10-CM | POA: Diagnosis present

## 2021-07-18 DIAGNOSIS — I255 Ischemic cardiomyopathy: Secondary | ICD-10-CM | POA: Diagnosis present

## 2021-07-18 DIAGNOSIS — Z20822 Contact with and (suspected) exposure to covid-19: Secondary | ICD-10-CM | POA: Diagnosis present

## 2021-07-18 DIAGNOSIS — Z8679 Personal history of other diseases of the circulatory system: Secondary | ICD-10-CM

## 2021-07-18 DIAGNOSIS — N400 Enlarged prostate without lower urinary tract symptoms: Secondary | ICD-10-CM | POA: Diagnosis present

## 2021-07-18 DIAGNOSIS — I10 Essential (primary) hypertension: Secondary | ICD-10-CM | POA: Diagnosis not present

## 2021-07-18 DIAGNOSIS — I700 Atherosclerosis of aorta: Secondary | ICD-10-CM | POA: Diagnosis present

## 2021-07-18 DIAGNOSIS — Z87891 Personal history of nicotine dependence: Secondary | ICD-10-CM | POA: Diagnosis not present

## 2021-07-18 DIAGNOSIS — E7800 Pure hypercholesterolemia, unspecified: Secondary | ICD-10-CM

## 2021-07-18 DIAGNOSIS — I110 Hypertensive heart disease with heart failure: Secondary | ICD-10-CM | POA: Diagnosis present

## 2021-07-18 DIAGNOSIS — Z800 Family history of malignant neoplasm of digestive organs: Secondary | ICD-10-CM | POA: Diagnosis not present

## 2021-07-18 DIAGNOSIS — H53461 Homonymous bilateral field defects, right side: Secondary | ICD-10-CM | POA: Diagnosis present

## 2021-07-18 DIAGNOSIS — E222 Syndrome of inappropriate secretion of antidiuretic hormone: Secondary | ICD-10-CM | POA: Diagnosis present

## 2021-07-18 DIAGNOSIS — E785 Hyperlipidemia, unspecified: Secondary | ICD-10-CM | POA: Diagnosis present

## 2021-07-18 DIAGNOSIS — I69320 Aphasia following cerebral infarction: Secondary | ICD-10-CM | POA: Diagnosis not present

## 2021-07-18 DIAGNOSIS — R29705 NIHSS score 5: Secondary | ICD-10-CM | POA: Diagnosis present

## 2021-07-18 DIAGNOSIS — I5022 Chronic systolic (congestive) heart failure: Secondary | ICD-10-CM

## 2021-07-18 DIAGNOSIS — K219 Gastro-esophageal reflux disease without esophagitis: Secondary | ICD-10-CM | POA: Diagnosis present

## 2021-07-18 DIAGNOSIS — I6389 Other cerebral infarction: Secondary | ICD-10-CM | POA: Diagnosis not present

## 2021-07-18 DIAGNOSIS — Z66 Do not resuscitate: Secondary | ICD-10-CM | POA: Diagnosis present

## 2021-07-18 DIAGNOSIS — I2510 Atherosclerotic heart disease of native coronary artery without angina pectoris: Secondary | ICD-10-CM | POA: Diagnosis present

## 2021-07-18 DIAGNOSIS — Z8249 Family history of ischemic heart disease and other diseases of the circulatory system: Secondary | ICD-10-CM | POA: Diagnosis not present

## 2021-07-18 DIAGNOSIS — Z85828 Personal history of other malignant neoplasm of skin: Secondary | ICD-10-CM | POA: Diagnosis not present

## 2021-07-18 DIAGNOSIS — I4821 Permanent atrial fibrillation: Secondary | ICD-10-CM | POA: Diagnosis present

## 2021-07-18 DIAGNOSIS — N390 Urinary tract infection, site not specified: Secondary | ICD-10-CM | POA: Diagnosis present

## 2021-07-18 DIAGNOSIS — I639 Cerebral infarction, unspecified: Secondary | ICD-10-CM | POA: Diagnosis present

## 2021-07-18 DIAGNOSIS — I63432 Cerebral infarction due to embolism of left posterior cerebral artery: Secondary | ICD-10-CM | POA: Diagnosis present

## 2021-07-18 LAB — BASIC METABOLIC PANEL
Anion gap: 10 (ref 5–15)
BUN: 15 mg/dL (ref 8–23)
CO2: 27 mmol/L (ref 22–32)
Calcium: 9.4 mg/dL (ref 8.9–10.3)
Chloride: 93 mmol/L — ABNORMAL LOW (ref 98–111)
Creatinine, Ser: 0.9 mg/dL (ref 0.61–1.24)
GFR, Estimated: >60 mL/min (ref >60)
Glucose, Bld: 92 mg/dL (ref 70–99)
Potassium: 3.9 mmol/L (ref 3.5–5.1)
Sodium: 130 mmol/L — ABNORMAL LOW (ref 135–145)

## 2021-07-18 LAB — LIPID PANEL
Cholesterol: 100 mg/dL (ref 0–200)
HDL: 43 mg/dL (ref >40)
LDL Cholesterol: 45 mg/dL (ref 0–99)
Total CHOL/HDL Ratio: 2.3 RATIO
Triglycerides: 58 mg/dL (ref <150)
VLDL: 12 mg/dL (ref 0–40)

## 2021-07-18 LAB — ECHOCARDIOGRAM COMPLETE
AR max vel: 1.33 cm2
AV Area VTI: 0.93 cm2
AV Area mean vel: 1.3 cm2
AV Mean grad: 9 mmHg
AV Peak grad: 18.3 mmHg
Ao pk vel: 2.14 m/s
Area-P 1/2: 4.31 cm2
Height: 70 in
MV M vel: 3.84 m/s
MV Peak grad: 59 mmHg
S' Lateral: 3 cm
Single Plane A4C EF: 65.9 %
Weight: 2680.79 oz

## 2021-07-18 LAB — DIGOXIN LEVEL: Digoxin Level: 1.1 ng/mL (ref 0.8–2.0)

## 2021-07-18 LAB — HEMOGLOBIN A1C
Hgb A1c MFr Bld: 5.7 % — ABNORMAL HIGH (ref 4.8–5.6)
Mean Plasma Glucose: 116.89 mg/dL

## 2021-07-18 LAB — SARS CORONAVIRUS 2 (TAT 6-24 HRS): SARS Coronavirus 2: NEGATIVE

## 2021-07-18 MED ORDER — ASCORBIC ACID 500 MG PO TABS
500.0000 mg | ORAL_TABLET | Freq: Every day | ORAL | Status: DC
  Administered 2021-07-18 – 2021-07-20 (×3): 500 mg via ORAL
  Filled 2021-07-18 (×3): qty 1

## 2021-07-18 MED ORDER — VITAMIN D 25 MCG (1000 UNIT) PO TABS
2000.0000 [IU] | ORAL_TABLET | ORAL | Status: DC
  Administered 2021-07-18 – 2021-07-20 (×2): 2000 [IU] via ORAL
  Filled 2021-07-18 (×2): qty 2

## 2021-07-18 MED ORDER — FINASTERIDE 5 MG PO TABS
5.0000 mg | ORAL_TABLET | Freq: Every day | ORAL | Status: DC
  Administered 2021-07-18 – 2021-07-20 (×3): 5 mg via ORAL
  Filled 2021-07-18 (×3): qty 1

## 2021-07-18 MED ORDER — METOPROLOL SUCCINATE ER 25 MG PO TB24
25.0000 mg | ORAL_TABLET | Freq: Every day | ORAL | Status: DC
  Administered 2021-07-18 – 2021-07-20 (×2): 25 mg via ORAL
  Filled 2021-07-18 (×3): qty 1

## 2021-07-18 MED ORDER — SIMVASTATIN 20 MG PO TABS
10.0000 mg | ORAL_TABLET | Freq: Every day | ORAL | Status: DC
  Administered 2021-07-18 – 2021-07-19 (×2): 10 mg via ORAL
  Filled 2021-07-18 (×2): qty 1

## 2021-07-18 MED ORDER — MELATONIN 3 MG PO TABS
3.0000 mg | ORAL_TABLET | Freq: Every day | ORAL | Status: DC
  Administered 2021-07-18 – 2021-07-19 (×2): 3 mg via ORAL
  Filled 2021-07-18 (×2): qty 1

## 2021-07-18 MED ORDER — DIGOXIN 125 MCG PO TABS
0.1250 mg | ORAL_TABLET | Freq: Every day | ORAL | Status: DC
  Administered 2021-07-18 – 2021-07-20 (×3): 0.125 mg via ORAL
  Filled 2021-07-18 (×3): qty 1

## 2021-07-18 MED ORDER — ASPIRIN EC 81 MG PO TBEC
81.0000 mg | DELAYED_RELEASE_TABLET | Freq: Every day | ORAL | Status: DC
  Administered 2021-07-18 – 2021-07-20 (×3): 81 mg via ORAL
  Filled 2021-07-18 (×3): qty 1

## 2021-07-18 MED ORDER — AMLODIPINE BESYLATE 2.5 MG PO TABS: 2.5000 mg | ORAL_TABLET | Freq: Every evening | ORAL | Status: DC

## 2021-07-18 NOTE — Progress Notes (Signed)
Placed patient on CPAP via FFM, auto titrate settings max 15, min 5cm H2O. Tolerating well at this time. RN aware.

## 2021-07-18 NOTE — Progress Notes (Addendum)
STROKE TEAM PROGRESS NOTE   INTERVAL HISTORY Wife at the bedside. Pt lying in bed. Awake alert, orientated to place and age and people but not to time. Still has dense right hemianopia. Discussed extensively about anticoagulation, long term anticoagulation vs. Short term anticoagulation with Watchman device. They need time to consider and they would like me to talk to his cardiologist [name] also.    Vitals:   07/18/21 0244 07/18/21 0444 07/18/21 0644 07/18/21 0714  BP: (!) 93/55 (!) 119/54 117/80 119/80  Pulse: 61 (!) 50 (!) 59 (!) 58  Resp: '14 20 18 '$ (!) 22  Temp:  97.6 F (36.4 C) 98.1 F (36.7 C) 97.9 F (36.6 C)  TempSrc:  Axillary Oral Oral  SpO2: 98% 99% 96% 97%  Weight:      Height:       CBC:  Recent Labs  Lab 07/17/21 1331  WBC 7.6  NEUTROABS 4.5  HGB 12.9*  HCT 38.2*  MCV 92.9  PLT 123456*   Basic Metabolic Panel:  Recent Labs  Lab 07/17/21 1331 07/18/21 0235  NA 128* 130*  K 4.0 3.9  CL 93* 93*  CO2 24 27  GLUCOSE 117* 92  BUN 16 15  CREATININE 0.93 0.90  CALCIUM 9.3 9.4   Lipid Panel:  Recent Labs  Lab 07/18/21 0235  CHOL 100  TRIG 58  HDL 43  CHOLHDL 2.3  VLDL 12  LDLCALC 45   HgbA1c:  Recent Labs  Lab 07/18/21 0235  HGBA1C 5.7*   Urine Drug Screen: No results for input(s): LABOPIA, COCAINSCRNUR, LABBENZ, AMPHETMU, THCU, LABBARB in the last 168 hours.  Alcohol Level No results for input(s): ETH in the last 168 hours.  IMAGING past 24 hours CT ANGIO HEAD W OR WO CONTRAST  Result Date: 07/17/2021 CLINICAL DATA:  Stroke/TIA, assess intracranial arteries. Visual disturbance. Left PCA infarct on MRI. EXAM: CT ANGIOGRAPHY HEAD AND NECK TECHNIQUE: Multidetector CT imaging of the head and neck was performed using the standard protocol during bolus administration of intravenous contrast. Multiplanar CT image reconstructions and MIPs were obtained to evaluate the vascular anatomy. Carotid stenosis measurements (when applicable) are obtained  utilizing NASCET criteria, using the distal internal carotid diameter as the denominator. CONTRAST:  103m OMNIPAQUE IOHEXOL 350 MG/ML SOLN COMPARISON:  Head MRI 07/17/2021. Head MRA 03/10/2021. Head and neck CTA 12/08/2017. FINDINGS: CT HEAD FINDINGS Brain: As seen on today's MRI, there is a large early subacute left PCA infarct with mild petechial hemorrhage. A small chronic left MCA territory infarct is again noted involving the frontal operculum and insula, and there are chronic bilateral cerebellar infarcts. Hypodensities elsewhere in the cerebral white matter bilaterally are nonspecific but compatible with mild chronic small vessel ischemic disease. There is mild-to-moderate cerebral atrophy. Vascular: Calcified atherosclerosis at the skull base. Skull: Left frontoparietal craniotomy. Right frontal and parietal burr holes. Sinuses: Visualized paranasal sinuses and mastoid air cells are clear. Orbits: Unremarkable. Review of the MIP images confirms the above findings CTA NECK FINDINGS Aortic arch: Standard 3 vessel aortic arch with moderate atherosclerotic plaque. No significant arch vessel origin stenosis. Right carotid system: Patent without evidence of stenosis or dissection. Left carotid system: Patent with a small amount of calcified and soft plaque at the carotid bifurcation. No evidence of a significant stenosis or dissection. Vertebral arteries: The vertebral arteries are patent with the left being mildly dominant. Calcified plaque at both vertebral artery origins does not result in significant stenosis. Skeleton: Advanced cervical disc degeneration. Left facet ankylosis  at C2-3. Other neck: No evidence of cervical lymphadenopathy or mass. Upper chest: Biapical pleuroparenchymal lung scarring. Mild chronic subpleural reticulation and ground-glass opacity in both upper lobes, less than on the 2019 CTA. Review of the MIP images confirms the above findings CTA HEAD FINDINGS Anterior circulation: The  internal carotid arteries are patent from skull base to carotid termini with calcified plaque resulting in at most mild paraclinoid stenosis bilaterally. The apparent moderate proximal left petrous stenosis on the prior MRA was in part artifactual due to susceptibility from adjacent bone and middle ear gas with only at most mild narrowing evident on CTA. ACAs and MCAs are patent without evidence of a proximal branch occlusion or significant proximal stenosis. No aneurysm is identified. Posterior circulation: The intracranial internal carotid arteries are patent with atherosclerotic irregularity but no significant stenosis. Patent PICA and SCA origins are identified bilaterally. The basilar artery is widely patent. The PCAs are patent with some asymmetric distal branch vessel attenuation on the left but no significant proximal stenosis. No aneurysm is identified. Venous sinuses: Patent. Anatomic variants: None. Review of the MIP images confirms the above findings IMPRESSION: 1. Mild atherosclerosis in the head and neck without a large vessel occlusion or high-grade proximal stenosis. 2. Large early subacute left PCA infarct. 3. Aortic Atherosclerosis (ICD10-I70.0). Electronically Signed   By: Logan Bores M.D.   On: 07/17/2021 20:45   CT ANGIO NECK W OR WO CONTRAST  Result Date: 07/17/2021 CLINICAL DATA:  Stroke/TIA, assess intracranial arteries. Visual disturbance. Left PCA infarct on MRI. EXAM: CT ANGIOGRAPHY HEAD AND NECK TECHNIQUE: Multidetector CT imaging of the head and neck was performed using the standard protocol during bolus administration of intravenous contrast. Multiplanar CT image reconstructions and MIPs were obtained to evaluate the vascular anatomy. Carotid stenosis measurements (when applicable) are obtained utilizing NASCET criteria, using the distal internal carotid diameter as the denominator. CONTRAST:  56m OMNIPAQUE IOHEXOL 350 MG/ML SOLN COMPARISON:  Head MRI 07/17/2021. Head MRA  03/10/2021. Head and neck CTA 12/08/2017. FINDINGS: CT HEAD FINDINGS Brain: As seen on today's MRI, there is a large early subacute left PCA infarct with mild petechial hemorrhage. A small chronic left MCA territory infarct is again noted involving the frontal operculum and insula, and there are chronic bilateral cerebellar infarcts. Hypodensities elsewhere in the cerebral white matter bilaterally are nonspecific but compatible with mild chronic small vessel ischemic disease. There is mild-to-moderate cerebral atrophy. Vascular: Calcified atherosclerosis at the skull base. Skull: Left frontoparietal craniotomy. Right frontal and parietal burr holes. Sinuses: Visualized paranasal sinuses and mastoid air cells are clear. Orbits: Unremarkable. Review of the MIP images confirms the above findings CTA NECK FINDINGS Aortic arch: Standard 3 vessel aortic arch with moderate atherosclerotic plaque. No significant arch vessel origin stenosis. Right carotid system: Patent without evidence of stenosis or dissection. Left carotid system: Patent with a small amount of calcified and soft plaque at the carotid bifurcation. No evidence of a significant stenosis or dissection. Vertebral arteries: The vertebral arteries are patent with the left being mildly dominant. Calcified plaque at both vertebral artery origins does not result in significant stenosis. Skeleton: Advanced cervical disc degeneration. Left facet ankylosis at C2-3. Other neck: No evidence of cervical lymphadenopathy or mass. Upper chest: Biapical pleuroparenchymal lung scarring. Mild chronic subpleural reticulation and ground-glass opacity in both upper lobes, less than on the 2019 CTA. Review of the MIP images confirms the above findings CTA HEAD FINDINGS Anterior circulation: The internal carotid arteries are patent from skull base to  carotid termini with calcified plaque resulting in at most mild paraclinoid stenosis bilaterally. The apparent moderate proximal left  petrous stenosis on the prior MRA was in part artifactual due to susceptibility from adjacent bone and middle ear gas with only at most mild narrowing evident on CTA. ACAs and MCAs are patent without evidence of a proximal branch occlusion or significant proximal stenosis. No aneurysm is identified. Posterior circulation: The intracranial internal carotid arteries are patent with atherosclerotic irregularity but no significant stenosis. Patent PICA and SCA origins are identified bilaterally. The basilar artery is widely patent. The PCAs are patent with some asymmetric distal branch vessel attenuation on the left but no significant proximal stenosis. No aneurysm is identified. Venous sinuses: Patent. Anatomic variants: None. Review of the MIP images confirms the above findings IMPRESSION: 1. Mild atherosclerosis in the head and neck without a large vessel occlusion or high-grade proximal stenosis. 2. Large early subacute left PCA infarct. 3. Aortic Atherosclerosis (ICD10-I70.0). Electronically Signed   By: Logan Bores M.D.   On: 07/17/2021 20:45   MR BRAIN WO CONTRAST  Result Date: 07/17/2021 CLINICAL DATA:  Neuro deficit, acute, stroke suspected. Additional history provided: Right eye vision abnormality which began on Saturday. EXAM: MRI HEAD WITHOUT CONTRAST TECHNIQUE: Multiplanar, multiecho pulse sequences of the brain and surrounding structures were obtained without intravenous contrast. COMPARISON:  MRI brain 03/09/2021, MRA head 03/10/2021. Head CT 03/09/2021. FINDINGS: Brain: Intermittently motion degraded examination. Most notably, there is moderate motion degradation of the sagittal T1 weighted sequence, moderate motion degradation of the coronal T2 TSE sequence. Moderate generalized cerebral atrophy. Large acute cortical/subcortical infarct within the left PCA vascular territory, predominantly within the left temporal and occipital lobes. Mild petechial hemorrhage within portions of the infarction  territory. Redemonstrated chronic cortically based infarcts within the left frontal lobe/insula. Mild multifocal T2/FLAIR hyperintensity within the white matter, nonspecific but compatible with chronic small vessel ischemic disease. Small chronic infarcts within the bilateral cerebellar hemispheres and chronic hemorrhage within the right cerebellar hemisphere, unchanged. No evidence of an intracranial mass. No extra-axial fluid collection. No midline shift. Vascular: No definite loss of proximal arterial flow voids appreciated. Skull and upper cervical spine: No focal suspicious marrow lesion. Left-sided cranioplasty. Bilateral frontoparietal burr holes. Sinuses/Orbits: Visualized orbits show no acute finding. No significant paranasal sinus disease at the imaged levels. IMPRESSION: Motion degraded exam. Large acute/early subacute left PCA territory cortical/subcortical infarct, predominantly within the left temporal and occipital lobes. Mild petechial hemorrhage within portions of the infarction territory. Redemonstrated chronic cortically based infarcts within the left frontal lobe/insula. Background mild chronic small vessel ischemic changes within the cerebral white matter. Redemonstrated small chronic infarcts within the bilateral cerebellar hemispheres, and chronic hemorrhage within the right cerebellar hemisphere, unchanged. Moderate generalized cerebral atrophy. Electronically Signed   By: Kellie Simmering D.O.   On: 07/17/2021 15:16    PHYSICAL EXAM  Temp:  [97.6 F (36.4 C)-98.6 F (37 C)] 98 F (36.7 C) (09/07 1105) Pulse Rate:  [37-78] 57 (09/07 1105) Resp:  [13-22] 21 (09/07 1105) BP: (93-161)/(54-86) 124/66 (09/07 1105) SpO2:  [96 %-100 %] 97 % (09/07 1105) FiO2 (%):  [21 %] 21 % (09/07 0132) Weight:  [76 kg] 76 kg (09/07 0132)  General - Well nourished, well developed, in no apparent distress.  Ophthalmologic - fundi not visualized due to noncooperation.  Cardiovascular - irregularly  irregular heart rate and rhythm.  Mental Status -  Level of arousal and orientated to place and age and people but not to  time. Language including naming, repetition, comprehension was assessed and found intact.  Intermittent word salad and incoherent speech.  Cranial Nerves II - XII - II - Right homonymous hemianopia III, IV, VI - Extraocular movements intact. V - Facial sensation intact bilaterally. VII - Facial movement intact bilaterally. VIII - Hearing & vestibular intact bilaterally. X - Palate elevates symmetrically. XI - Chin turning & shoulder shrug intact bilaterally. XII - Tongue protrusion intact.  Motor Strength - The patient's strength was normal in all extremities and pronator drift was absent.  Bulk was normal and fasciculations were absent.   Motor Tone - Muscle tone was assessed at the neck and appendages and was normal.  Reflexes - The patient's reflexes were symmetrical in all extremities and he had no pathological reflexes.  Sensory - Light touch, temperature/pinprick were assessed and were symmetrical.    Coordination - The patient had normal movements in the hands with no ataxia or dysmetria.  Tremor was absent.  Gait and Station - deferred.   ASSESSMENT/PLAN Ryan Savage is a 87 y.o. male with history of A. fib not on AC, [name], CHF, OSA on CPAP, hypertension, hyperlipidemia, history of stroke, history of SDH and ICH presenting with right hemianopia and word finding difficulty.   Stroke:  left large PCA infarct embolic likely secondary to chronic A. fib not on AC CT head large early subacute left PCA infarct MRI Large acute/early subacute left PCA territory cortical/subcortical infarct, predominantly within the left temporal and occipital lobes. Mild petechial hemorrhage within portions of the infarction territory.  Chronic infarct left frontal, bilateral cerebellum. CTA head and neck no LVO, no left PCA occlusion. 2D Echo EF 60 to 65% LDL 45 HgbA1c  5.7 VTE prophylaxis -SCDs No antithrombotic prior to admission, now on aspirin 81 mg daily. Need to consider long-term DOAC versus watchman device plus short-term DOAC.  Patient is considering Therapy recommendations: CIR Disposition: Pending  History of stroke 02/2021 left MCA punctate infarct on MRI.  MRA left ICA cervical-petrous moderate stenosis.  Carotid Doppler negative.  EF 45 to 50%, LDL 54, A1c 5.6.  Etiology likely due to A. fib not on AC, however given patient history of ICH and SAH, discharged on aspirin 81 and Zocor 10. However, wife stated that patient is not on aspirin 81 at home.  History of SDH and ICH SDH x3 status post bilateral evacuation in 2019 01/2018 right cerebellum small ICH No anticoagulation PTA Patient candidate for anticoagulation at this time given history   Chronic A. fib Follows with [name] cardiology Not on [facility] due to previous SDH and ICH Rate controlled On digoxin and metoprolol Supposed to be on aspirin 81, however wife stated that patient not on aspirin at home. With current large left PCA infarct, need to consider long-term DOAC versus watchman device plus short-term DOAC.  Patient is considering  Hypertension Stable Long-term BP goal normotensive  Hyperlipidemia Home meds Zocor 10 LDL 45, at goal < 70 continue home Zocor '10mg'$  at discharge  Other Stroke Risk Factors Advanced Age >/= 47  Cigarette smoker in the past, quit 64 years ago Coronary artery disease Obstructive sleep apnea, on CPAP at home Congestive heart failure  Other Active Problems   Hospital day # 0  I discussed with [name]. I spent  35 minutes in total face-to-face time with the patient, more than 50% of which was spent in counseling and coordination of care, reviewing test results, images and medication, and discussing the diagnosis, treatment plan  and potential prognosis. This patient's care requiresreview of multiple databases, neurological assessment,  discussion with family, other specialists and medical decision making of high complexity.  Rosalin Hawking, MD PhD Stroke Neurology 07/18/2021 5:17 PM    To contact Stroke Continuity provider, please refer to [email or web address] After hours, contact General Neurology

## 2021-07-18 NOTE — Evaluation (Signed)
Speech Language Pathology Evaluation Patient Details Name: Ryan Savage MRN: [id number] DOB: 29-Dec-1928 Today's Date: 07/18/2021 Time: [id number] SLP Time Calculation (min) (ACUTE ONLY): 18 min  Problem List:  Patient Active Problem List   Diagnosis Date Noted   Acute CVA (cerebrovascular accident) (Ryan Savage) 03/09/2021   Aortic stenosis 05/22/2018   Pulmonary HTN (Ryan Savage) 05/22/2018   Subdural hemorrhage (Ryan Savage) 03/25/2018   Subdural hematoma without coma (Ryan Savage)    Seizure prophylaxis    Insomnia    Thrombocytopenia (Ryan Savage)    History of subdural hematoma    Tachypnea    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 (Ryan Savage) 03/20/2018   Protein-calorie malnutrition, severe 03/20/2018   Traumatic subdural hematoma (Ryan Savage) 02/24/2018   Acute blood loss anemia    Steroid-induced hyperglycemia    SIADH [id number]   Chronic systolic CHF (congestive heart failure), NYHA class 2 (Ryan Savage) 02/20/2018   Subdural hematoma (Ryan Savage) 02/20/2018   SDH (subdural hematoma) (Ryan Savage) 01/25/2018   Bilateral subdural hematomas ([place]) 01/23/2018   Malnutrition of moderate degree 12/10/2017   Cerebral embolism with cerebral infarction 12/09/2017   Odynophagia 12/08/2017   Syncope and collapse 12/08/2017   Hyperlipidemia 12/08/2017   OSA (obstructive sleep apnea) 12/08/2017   Dysarthria 12/08/2017   [name] (coronary artery disease) 12/08/2017   Syndrome of inappropriate ADH (SIADH) secretion ([place]) 12/08/2017   SIADH (syndrome of inappropriate ADH production) ([place])    Subclinical hyperthyroidism    Hyponatremia with excess extracellular fluid volume 11/18/2017   Hyperglycemia 11/18/2017   Supratherapeutic INR 11/18/2017   Mitral valve prolapse    Permanent atrial fibrillation ([place]) 11/08/2013   Benign essential HTN 11/08/2013   Mitral regurgitation 11/08/2013   Past Medical History:  Past Medical History:  Diagnosis Date   Aortic stenosis 05/22/2018   Mild with mean AVG 22mHg by echo 05/2018   Atrial  fibrillation, chronic (Ryan Savage)    Not on anticoagulation due to history of bilateral subdural bleeds due to recurrent falls   BPH (benign prostatic hypertrophy)    [name] (coronary artery disease), native coronary artery    Severe two-vessel [name] with chronically occluded LAD and RCA with widely patent left main, intermediate, and left circumflex branches.  On medical management.  He has extensive collaterals.   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 fib with RVR.  Repeat echo 05/2018 with EF 45-50%.   [name] (degenerative joint disease)    GERD (gastroesophageal reflux disease)    Hyperlipidemia    LDL goal less then 100   Hypertension    Microscopic hematuria    Ryan Savage  Mitral valve prolapse    with mild MR   OSA (obstructive sleep apnea)    Pulmonary HTN (Ryan Savage)    PASP 571mg by echo 05/2018 but normal pressures on right heart cath.   SDH (subdural hematoma) (Ryan Savage) 01/2018   SIADH (syndrome of inappropriate ADH production) ([facility] [place]   Skin cancer    "side of my nose" (11/19/2017)   [place] [place]   "fully recovered"   [place]rombocytopenia ([facility]   Mild- platelet count 143,000 on 02/2011, stable 08/2011   Past Surgical History:  Past Surgical History:  Procedure Laterality Date   APPENDECTOMY  1950   BACK SURGERY     BURR HOLE Bilateral 01/25/2018   Procedure: [facility] Surgeon: Ryan Savage;  [facility]  Service: Neurosurgery;  Laterality: Bilateral;   BURR HOLE Left 02/20/2018   Procedure: BURR HOLES for subdural hematoma;  Surgeon: Ryan Pies, MD;  Location: [place];  Service: Neurosurgery;  Laterality: Left;   BURR HOLE Left 03/20/2018   Procedure: Craniotomy for subdural hematoma;  Surgeon: Kristeen Miss, MD;  Location: [place];  Service: Neurosurgery;  Laterality: Left;   FOREARM FRACTURE SURGERY Right Ryan Savage   ruptured disc repair   RIGHT/LEFT HEART CATH AND CORONARY ANGIOGRAPHY N/A 11/21/2017   Procedure: RIGHT/LEFT HEART CATH AND CORONARY ANGIOGRAPHY;  Surgeon: Ryan Mocha, MD;  Location: [place] [facility];  Service: Cardiovascular;  Laterality: N/A;   SKIN CANCER EXCISION Left    "side of my nose"   HPI:  Ryan Savage is a 87 y.o. male presenting with new onset of right sided visual field deficit, confusion, and word-finding difficulties; MRI brain reveals a large left temporal and occipital lobe ischemic infarction; chronic B cerebellar infarcts. Past medical history significant of atrial fibrillation not on anticoagulation due to falls, HTN, HLD, hx of CVA in 4/22, OSA, [name], SIADH, systolic CHF, SH x 3 in [id number]   Assessment / Plan / Recommendation Clinical Impression  Pt presents with a mild, primarily expressive aphasia charcterized by fluent conversation level language with intermittent word finding impairment with hesitations and circumlocutions. Pt able to convey most of his wants and needs with extra time. In confrontational naming pt had 8/8 accurate without dysarthia or paraphasias. Pt is aware of errors and self correct in most cases. Pt would benefit from f/u with CIR SLP interventions to target compensatory strategies and increased fluency. Prognosis for improvement very good.    SLP Assessment  SLP Recommendation/Assessment: Patient needs continued Speech Lanaguage Pathology Services SLP Visit Diagnosis: Aphasia (R47.01)    Follow Up Recommendations  Inpatient Rehab    Frequency and Duration min 2x/week  2 weeks      SLP Evaluation Cognition  Overall Cognitive Status: Impaired/Different from baseline Arousal/Alertness: Awake/alert Orientation Level: Oriented X4 Memory: Appears intact Awareness: Appears intact Problem Solving:  (NT)       Comprehension  Auditory Comprehension Overall Auditory Comprehension: Appears within functional limits for tasks assessed     Expression Verbal Expression Overall Verbal Expression: Impaired Initiation: No impairment Automatic Speech: Name;Social Response Level of Generative/Spontaneous Verbalization: Conversation Repetition: No impairment Naming: No impairment Pragmatics: No impairment   Oral / Motor  Oral Motor/Sensory Function Overall Oral Motor/Sensory Function: Within functional limits Motor Speech Overall Motor Speech: Appears within functional limits for tasks assessed   GO                    Berenise Hunton, Katherene Ponto 07/18/2021, 3:07 PM

## 2021-07-18 NOTE — ED Notes (Signed)
Pt transported to floor on stretcher on monitor via tech.

## 2021-07-18 NOTE — Progress Notes (Addendum)
PROGRESS NOTE    Ryan Savage  ZN:[id number] DOB: 23-Oct-1929 DOA: 07/17/2021 PCP: Lajean Manes, MD   Brief Narrative: -year-old with past medical history significant for A. fib not on anticoagulation due to falls, hypertension, hyperlipidemia, history of CVA 4/22, OSA, [name], SIADH, systolic heart failure, subdural hematoma x3 in 2019 who presented with vision changes,  confusion.  Right brain showed large acute early subacute left PCA territory cortical subcortical infarct predominantly within the left temporal and occipital lobes with mild petechial hemorrhage with thin portions of the infarct territory.    Assessment & Plan:   Principal Problem:   Acute CVA (cerebrovascular accident) ([place]) Active Problems:   Permanent atrial fibrillation (HCC)   Benign essential HTN   SIADH (syndrome of inappropriate ADH production) (HCC)   Hyperlipidemia   OSA (obstructive sleep apnea)   [name] (coronary artery disease)   Chronic systolic CHF (congestive heart failure), NYHA class 2 ([place])   1-acute CVA: Patient presented with new right side visual field deficit, confusion and aphasia. MRI reveals large acute early subacute left PCA territory cortical subcortical infarct. Neurology consulted and following. Currently on a baby aspirin.  Follow neurology recommendation LDL; 45 A1c; 5.7  2-permanent A. fib: Continue with beta-blocker and digoxin. Not on anticoagulation due to history of subdural and fall. Follow recommendation from neurology.  Hypertension: Continue with Norvasc and metoprolol  Hyperlipidemia: Continue with the statins LDL:45  [name]: Continue with statin and aspirin  Chronic systolic heart failure, compensated OSA: Continue with CPAP  SIADH, Hyponatremia; stable. Follow trend.      Estimated body mass index is 24.04 kg/m as calculated from the following:   Height as of this encounter: '5\' 10"'$  (1.778 m).   Weight as of this encounter: 76 kg.   DVT prophylaxis:  SCDs Code Status: DNR Family Communication: Discussed with wife Disposition Plan:  Status is: Observation  The patient will require care spanning > 2 midnights and should be moved to inpatient because: Inpatient level of care appropriate due to severity of illness  Dispo: The patient is from: Home              Anticipated d/c is to: CIR versus home with home health              Patient currently is not medically stable to d/c.   Difficult to place patient No        Consultants:  Neurology   Procedures:  ECHO Left Ventricle: Left ventricular ejection fraction, by estimation, is 60  to 65%. The left ventricle has normal function. The left ventricle has no  regional wall motion abnormalities. The left ventricular internal cavity  size was normal in size. There is   mild concentric left ventricular hypertrophy. Left ventricular diastolic  function could not be evaluated.  Carotid doppler.   Antimicrobials:    Subjective: He is alert and conversant, he report problems with right eyes still. He denies headaches.   Objective: Vitals:   07/18/21 0644 07/18/21 0714 07/18/21 1054 07/18/21 1105  BP: 117/80 119/80 121/72 124/66  Pulse: (!) 59 (!) 58 66 (!) 57  Resp: 18 (!) 22  (!) 21  Temp: 98.1 F (36.7 C) 97.9 F (36.6 C)  98 F (36.7 C)  TempSrc: Oral Oral  Oral  SpO2: 96% 97%  97%  Weight:      Height:        Intake/Output Summary (Last 24 hours) at 07/18/2021 1433 Last data filed at 07/18/2021 0952 Gross per 24  hour  Intake 460 ml  Output 725 ml  Net -265 ml   Filed Weights   07/17/21 1239 07/18/21 0132  Weight: 78.9 kg 76 kg    Examination:  General exam: Appears calm and comfortable  Respiratory system: Clear to auscultation. Respiratory effort normal. Cardiovascular system: S1 & S2 heard, RRR. No JVD, murmurs, rubs, gallops or clicks. No pedal edema. Gastrointestinal system: Abdomen is nondistended, soft and nontender. No organomegaly or masses felt.  Normal bowel sounds heard. Central nervous system: Alert and oriented. No focal neurological deficits. Vision problems right eye.  Extremities: Symmetric 5 x 5 power.   Data Reviewed: I have personally reviewed following labs and imaging studies  CBC: Recent Labs  Lab 07/17/21 1331  WBC 7.6  NEUTROABS 4.5  HGB 12.9*  HCT 38.2*  MCV 92.9  PLT 123456*   Basic Metabolic Panel: Recent Labs  Lab 07/17/21 1331 07/18/21 0235  NA 128* 130*  K 4.0 3.9  CL 93* 93*  CO2 24 27  GLUCOSE 117* 92  BUN 16 15  CREATININE 0.93 0.90  CALCIUM 9.3 9.4   GFR: Estimated Creatinine Clearance: 56.3 mL/min (by C-G formula based on SCr of 0.9 mg/dL). Liver Function Tests: Recent Labs  Lab 07/17/21 1331  AST 27  ALT 16  ALKPHOS 44  BILITOT 1.5*  PROT 7.8  ALBUMIN 3.8   No results for input(s): LIPASE, AMYLASE in the last 168 hours. No results for input(s): AMMONIA in the last 168 hours. Coagulation Profile: No results for input(s): INR, PROTIME in the last 168 hours. Cardiac Enzymes: No results for input(s): CKTOTAL, CKMB, CKMBINDEX, TROPONINI in the last 168 hours. BNP (last 3 results) No results for input(s): PROBNP in the last 8760 hours. HbA1C: Recent Labs    07/18/21 0235  HGBA1C 5.7*   CBG: No results for input(s): GLUCAP in the last 168 hours. Lipid Profile: Recent Labs    07/18/21 0235  CHOL 100  HDL 43  LDLCALC 45  TRIG 58  CHOLHDL 2.3   Thyroid Function Tests: No results for input(s): TSH, T4TOTAL, FREET4, T3FREE, THYROIDAB in the last 72 hours. Anemia Panel: No results for input(s): VITAMINB12, FOLATE, FERRITIN, TIBC, IRON, RETICCTPCT in the last 72 hours. Sepsis Labs: No results for input(s): PROCALCITON, LATICACIDVEN in the last 168 hours.  Recent Results (from the past 240 hour(s))  SARS CORONAVIRUS 2 (TAT 6-24 HRS) Nasopharyngeal Nasopharyngeal Swab     Status: None   Collection Time: 07/17/21 11:42 PM   Specimen: Nasopharyngeal Swab  Result Value Ref  Range Status   SARS Coronavirus 2 NEGATIVE NEGATIVE Final    Comment: (NOTE) SARS-CoV-2 target nucleic acids are NOT DETECTED.  The SARS-CoV-2 RNA is generally detectable in upper and lower respiratory specimens during the acute phase of infection. Negative results do not preclude SARS-CoV-2 infection, do not rule out co-infections with other pathogens, and should not be used as the sole basis for treatment or other patient management decisions. Negative results must be combined with clinical observations, patient history, and epidemiological information. The expected result is Negative.  Fact Sheet for Patients: SugarRoll.be  Fact Sheet for Healthcare Providers: https://www.woods-mathews.com/  This test is not yet approved or cleared by the [place] FDA and  has been authorized for detection and/or diagnosis of SARS-CoV-2 by FDA under an Emergency Use Authorization (EUA). This EUA will remain  in effect (meaning this test can be used) for the duration of the COVID-19 declaration under Se ction 564(b)(1) of the Act, 21  U.S.C. section 360bbb-3(b)(1), unless the authorization is terminated or revoked sooner.  Performed at [facility], [place] [address] [place], [place] [address]          Radiology Studies: CT ANGIO HEAD W OR WO CONTRAST  Result Date: 07/17/2021 CLINICAL DATA:  Stroke/TIA, assess intracranial arteries. Visual disturbance. Left PCA infarct on MRI. EXAM: CT ANGIOGRAPHY HEAD AND NECK TECHNIQUE: Multidetector CT imaging of the head and neck was performed using the standard protocol during bolus administration of intravenous contrast. Multiplanar CT image reconstructions and MIPs were obtained to evaluate the vascular anatomy. Carotid stenosis measurements (when applicable) are obtained utilizing NASCET criteria, using the distal internal carotid diameter as the denominator. CONTRAST:  32m OMNIPAQUE IOHEXOL 350 MG/ML  SOLN COMPARISON:  Head MRI 07/17/2021. Head MRA 03/10/2021. Head and neck CTA 12/08/2017. FINDINGS: CT HEAD FINDINGS Brain: As seen on today's MRI, there is a large early subacute left PCA infarct with mild petechial hemorrhage. A small chronic left MCA territory infarct is again noted involving the frontal operculum and insula, and there are chronic bilateral cerebellar infarcts. Hypodensities elsewhere in the cerebral white matter bilaterally are nonspecific but compatible with mild chronic small vessel ischemic disease. There is mild-to-moderate cerebral atrophy. Vascular: Calcified atherosclerosis at the skull base. Skull: Left frontoparietal craniotomy. Right frontal and parietal burr holes. Sinuses: Visualized paranasal sinuses and mastoid air cells are clear. Orbits: Unremarkable. Review of the MIP images confirms the above findings CTA NECK FINDINGS Aortic arch: Standard 3 vessel aortic arch with moderate atherosclerotic plaque. No significant arch vessel origin stenosis. Right carotid system: Patent without evidence of stenosis or dissection. Left carotid system: Patent with a small amount of calcified and soft plaque at the carotid bifurcation. No evidence of a significant stenosis or dissection. Vertebral arteries: The vertebral arteries are patent with the left being mildly dominant. Calcified plaque at both vertebral artery origins does not result in significant stenosis. Skeleton: Advanced cervical disc degeneration. Left facet ankylosis at C2-3. Other neck: No evidence of cervical lymphadenopathy or mass. Upper chest: Biapical pleuroparenchymal lung scarring. Mild chronic subpleural reticulation and ground-glass opacity in both upper lobes, less than on the 2019 CTA. Review of the MIP images confirms the above findings CTA HEAD FINDINGS Anterior circulation: The internal carotid arteries are patent from skull base to carotid termini with calcified plaque resulting in at most mild paraclinoid stenosis  bilaterally. The apparent moderate proximal left petrous stenosis on the prior MRA was in part artifactual due to susceptibility from adjacent bone and middle ear gas with only at most mild narrowing evident on CTA. ACAs and MCAs are patent without evidence of a proximal branch occlusion or significant proximal stenosis. No aneurysm is identified. Posterior circulation: The intracranial internal carotid arteries are patent with atherosclerotic irregularity but no significant stenosis. Patent PICA and SCA origins are identified bilaterally. The basilar artery is widely patent. The PCAs are patent with some asymmetric distal branch vessel attenuation on the left but no significant proximal stenosis. No aneurysm is identified. Venous sinuses: Patent. Anatomic variants: None. Review of the MIP images confirms the above findings IMPRESSION: 1. Mild atherosclerosis in the head and neck without a large vessel occlusion or high-grade proximal stenosis. 2. Large early subacute left PCA infarct. 3. Aortic Atherosclerosis (ICD10-I70.0). Electronically Signed   By: ALogan BoresM.D.   On: 07/17/2021 20:45   CT ANGIO NECK W OR WO CONTRAST  Result Date: 07/17/2021 CLINICAL DATA:  Stroke/TIA, assess intracranial arteries. Visual disturbance. Left PCA  infarct on MRI. EXAM: CT ANGIOGRAPHY HEAD AND NECK TECHNIQUE: Multidetector CT imaging of the head and neck was performed using the standard protocol during bolus administration of intravenous contrast. Multiplanar CT image reconstructions and MIPs were obtained to evaluate the vascular anatomy. Carotid stenosis measurements (when applicable) are obtained utilizing NASCET criteria, using the distal internal carotid diameter as the denominator. CONTRAST:  47m OMNIPAQUE IOHEXOL 350 MG/ML SOLN COMPARISON:  Head MRI 07/17/2021. Head MRA 03/10/2021. Head and neck CTA 12/08/2017. FINDINGS: CT HEAD FINDINGS Brain: As seen on today's MRI, there is a large early subacute left PCA infarct  with mild petechial hemorrhage. A small chronic left MCA territory infarct is again noted involving the frontal operculum and insula, and there are chronic bilateral cerebellar infarcts. Hypodensities elsewhere in the cerebral white matter bilaterally are nonspecific but compatible with mild chronic small vessel ischemic disease. There is mild-to-moderate cerebral atrophy. Vascular: Calcified atherosclerosis at the skull base. Skull: Left frontoparietal craniotomy. Right frontal and parietal burr holes. Sinuses: Visualized paranasal sinuses and mastoid air cells are clear. Orbits: Unremarkable. Review of the MIP images confirms the above findings CTA NECK FINDINGS Aortic arch: Standard 3 vessel aortic arch with moderate atherosclerotic plaque. No significant arch vessel origin stenosis. Right carotid system: Patent without evidence of stenosis or dissection. Left carotid system: Patent with a small amount of calcified and soft plaque at the carotid bifurcation. No evidence of a significant stenosis or dissection. Vertebral arteries: The vertebral arteries are patent with the left being mildly dominant. Calcified plaque at both vertebral artery origins does not result in significant stenosis. Skeleton: Advanced cervical disc degeneration. Left facet ankylosis at C2-3. Other neck: No evidence of cervical lymphadenopathy or mass. Upper chest: Biapical pleuroparenchymal lung scarring. Mild chronic subpleural reticulation and ground-glass opacity in both upper lobes, less than on the 2019 CTA. Review of the MIP images confirms the above findings CTA HEAD FINDINGS Anterior circulation: The internal carotid arteries are patent from skull base to carotid termini with calcified plaque resulting in at most mild paraclinoid stenosis bilaterally. The apparent moderate proximal left petrous stenosis on the prior MRA was in part artifactual due to susceptibility from adjacent bone and middle ear gas with only at most mild  narrowing evident on CTA. ACAs and MCAs are patent without evidence of a proximal branch occlusion or significant proximal stenosis. No aneurysm is identified. Posterior circulation: The intracranial internal carotid arteries are patent with atherosclerotic irregularity but no significant stenosis. Patent PICA and SCA origins are identified bilaterally. The basilar artery is widely patent. The PCAs are patent with some asymmetric distal branch vessel attenuation on the left but no significant proximal stenosis. No aneurysm is identified. Venous sinuses: Patent. Anatomic variants: None. Review of the MIP images confirms the above findings IMPRESSION: 1. Mild atherosclerosis in the head and neck without a large vessel occlusion or high-grade proximal stenosis. 2. Large early subacute left PCA infarct. 3. Aortic Atherosclerosis (ICD10-I70.0). Electronically Signed   By: ALogan BoresM.D.   On: 07/17/2021 20:45   MR BRAIN WO CONTRAST  Result Date: 07/17/2021 CLINICAL DATA:  Neuro deficit, acute, stroke suspected. Additional history provided: Right eye vision abnormality which began on Saturday. EXAM: MRI HEAD WITHOUT CONTRAST TECHNIQUE: Multiplanar, multiecho pulse sequences of the brain and surrounding structures were obtained without intravenous contrast. COMPARISON:  MRI brain 03/09/2021, MRA head 03/10/2021. Head CT 03/09/2021. FINDINGS: Brain: Intermittently motion degraded examination. Most notably, there is moderate motion degradation of the sagittal T1 weighted sequence, moderate motion  degradation of the coronal T2 TSE sequence. Moderate generalized cerebral atrophy. Large acute cortical/subcortical infarct within the left PCA vascular territory, predominantly within the left temporal and occipital lobes. Mild petechial hemorrhage within portions of the infarction territory. Redemonstrated chronic cortically based infarcts within the left frontal lobe/insula. Mild multifocal T2/FLAIR hyperintensity within  the white matter, nonspecific but compatible with chronic small vessel ischemic disease. Small chronic infarcts within the bilateral cerebellar hemispheres and chronic hemorrhage within the right cerebellar hemisphere, unchanged. No evidence of an intracranial mass. No extra-axial fluid collection. No midline shift. Vascular: No definite loss of proximal arterial flow voids appreciated. Skull and upper cervical spine: No focal suspicious marrow lesion. Left-sided cranioplasty. Bilateral frontoparietal burr holes. Sinuses/Orbits: Visualized orbits show no acute finding. No significant paranasal sinus disease at the imaged levels. IMPRESSION: Motion degraded exam. Large acute/early subacute left PCA territory cortical/subcortical infarct, predominantly within the left temporal and occipital lobes. Mild petechial hemorrhage within portions of the infarction territory. Redemonstrated chronic cortically based infarcts within the left frontal lobe/insula. Background mild chronic small vessel ischemic changes within the cerebral white matter. Redemonstrated small chronic infarcts within the bilateral cerebellar hemispheres, and chronic hemorrhage within the right cerebellar hemisphere, unchanged. Moderate generalized cerebral atrophy. Electronically Signed   By: Kellie Simmering D.O.   On: 07/17/2021 15:16   ECHOCARDIOGRAM COMPLETE  Result Date: 07/18/2021    ECHOCARDIOGRAM REPORT   Patient Name:   Ryan Savage Date of Exam: 07/18/2021 Medical Rec #:  [id number]       Height:       70.0 in Accession #:    [id number]      Weight:       167.5 lb Date of Birth:  Nov 06, 1929        BSA:          1.936 m Patient Age:    61 years        BP:           119/80 mmHg Patient Gender: M               HR:           76 bpm. Exam Location:  Inpatient Procedure: 2D Echo, Cardiac Doppler and Color Doppler Indications:    CVA  History:        Patient has prior history of Echocardiogram examinations, most                 recent 03/10/2021. [name].   Sonographer:    [place] Referring Phys: [id number] [place]  1. Left ventricular ejection fraction, by estimation, is 60 to 65%. The left ventricle has normal function. The left ventricle has no regional wall motion abnormalities. There is mild concentric left ventricular hypertrophy. Left ventricular diastolic function could not be evaluated.  2. Right ventricular systolic function is normal. The right ventricular size is mildly enlarged. There is mildly elevated pulmonary artery systolic pressure. The estimated right ventricular systolic pressure is [id number] mmHg.  3. Right atrial size was severely dilated.  4. Left atrial size was severely dilated.  5. The mitral valve is normal in structure. No evidence of mitral stenosis. There is mild Mitral Regurgitation. No mitral stenosis.  6. The aortic valve has an indeterminant number of cusps. There is moderate calcification of the aortic valve. There is moderate thickening of the aortic valve. Aortic valve regurgitation is not visualized. Planimetered AVA was 1.36cm2.     Aortic valve mean gradient measures 9.0 mmHg. Aortic valve Vmax  measures 2.14 m/s. The mean AVG and Vmax are consistent with mild AS but DVi is low at 0.24 and SVI is low at 26 which is most consistent with low flow low gradient moderate AS in the setting of preserved LVF.  7. Aortic dilatation noted. There is moderate dilatation of the aortic root and of the ascending aorta, measuring 44 mm.  8. The inferior vena cava is normal in size with <50% respiratory variability, suggesting right atrial pressure of 8 mmHg.Side by side compareson of imaged from 03/10/2021 demonstrated no change in the mean AVG but the SVi has decreased and DVi has decreased from 0.37 to 0.24. Visually the AV appears slightly more stesnotic. LVF appears improved. FINDINGS  Left Ventricle: Left ventricular ejection fraction, by estimation, is 60 to 65%. The left ventricle has normal function. The left ventricle has no regional  wall motion abnormalities. The left ventricular internal cavity size was normal in size. There is  mild concentric left ventricular hypertrophy. Left ventricular diastolic function could not be evaluated. Right Ventricle: The right ventricular size is mildly enlarged. No increase in right ventricular wall thickness. Right ventricular systolic function is normal. There is mildly elevated pulmonary artery systolic pressure. The tricuspid regurgitant velocity is 2.73 m/s, and with an assumed right atrial pressure of 8 mmHg, the estimated right ventricular systolic pressure is [id number] mmHg. Left Atrium: Left atrial size was severely dilated. Right Atrium: Right atrial size was severely dilated. Pericardium: There is no evidence of pericardial effusion. Mitral Valve: The mitral valve is abnormal. There is mild late systolic prolapse of the posterior leaflet of the mitral valve. Mild mitral annular calcification. Mild mitral valve regurgitation. No evidence of mitral valve stenosis. Tricuspid Valve: The tricuspid valve is normal in structure. Tricuspid valve regurgitation is mild . No evidence of tricuspid stenosis. Aortic Valve: Planimetered AVA was 1.36cm2. The aortic valve has an indeterminant number of cusps. There is moderate calcification of the aortic valve. There is moderate thickening of the aortic valve. Aortic valve regurgitation is not visualized. Moderate aortic stenosis is present. Aortic valve mean gradient measures 9.0 mmHg. Aortic valve peak gradient measures 18.3 mmHg. Aortic valve area, by VTI measures 0.93 cm. Pulmonic Valve: The pulmonic valve was normal in structure. Pulmonic valve regurgitation is mild. No evidence of pulmonic stenosis. Aorta: Aortic dilatation noted. There is moderate dilatation of the aortic root and of the ascending aorta, measuring 44 mm. Venous: The inferior vena cava is normal in size with less than 50% respiratory variability, suggesting right atrial pressure of 8 mmHg.  IAS/Shunts: No atrial level shunt detected by color flow Doppler.  LEFT VENTRICLE PLAX 2D LVIDd:         5.10 cm     Diastology LVIDs:         3.00 cm     LV e' medial:  7.07 cm/s LV PW:         1.20 cm     LV e' lateral: 11.50 cm/s LV IVS:        1.40 cm LVOT diam:     2.20 cm LV SV:         50 LV SV Index:   26 LVOT Area:     3.80 cm  LV Volumes (MOD) LV vol d, MOD A4C: 59.5 ml LV vol s, MOD A4C: 20.3 ml LV SV MOD A4C:     59.5 ml RIGHT VENTRICLE            IVC RV S prime:  8.92 cm/s  IVC diam: 1.50 cm TAPSE (M-mode): 2.3 cm LEFT ATRIUM              Index       RIGHT ATRIUM           Index LA diam:        5.40 cm  2.79 cm/m  RA Area:     42.50 cm LA Vol (A2C):   134.0 ml 69.21 ml/m RA Volume:   181.00 ml 93.49 ml/m LA Vol (A4C):   78.8 ml  40.70 ml/m LA Biplane Vol: 102.0 ml 52.69 ml/m  AORTIC VALVE                    PULMONIC VALVE AV Area (Vmax):    1.33 cm     PV Vmax:       0.80 m/s AV Area (Vmean):   1.30 cm     PV Peak grad:  2.6 mmHg AV Area (VTI):     0.93 cm AV Vmax:           214.00 cm/s AV Vmean:          136.000 cm/s AV VTI:            0.539 m AV Peak Grad:      18.3 mmHg AV Mean Grad:      9.0 mmHg LVOT Vmax:         75.10 cm/s LVOT Vmean:        46.400 cm/s LVOT VTI:          0.132 m LVOT/AV VTI ratio: 0.24  AORTA Ao Root diam: 3.50 cm Ao Asc diam:  4.40 cm MITRAL VALVE              TRICUSPID VALVE MV Area (PHT): 4.31 cm   TV Peak grad:   30.5 mmHg MR Peak grad: 59.0 mmHg   TV Vmax:        2.76 m/s MR Vmax:      384.00 cm/s TR Peak grad:   29.8 mmHg                           TR Vmax:        273.00 cm/s                            SHUNTS                           Systemic VTI:  0.13 m                           Systemic Diam: 2.20 cm Fransico Him MD Electronically signed by Fransico Him MD Signature Date/Time: 07/18/2021/11:17:33 AM    Final         Scheduled Meds:  ascorbic acid  500 mg Oral Daily   aspirin EC  81 mg Oral Daily   cholecalciferol  2,000 Units Oral QODAY   digoxin   0.125 mg Oral Daily   finasteride  5 mg Oral Daily   melatonin  3 mg Oral QHS   metoprolol succinate  25 mg Oral Daily   simvastatin  10 mg Oral q1800   Continuous Infusions:   LOS: 0 days    Time spent: 35 minutes.     Elmarie Shiley, MD [facility]   If 7PM-7AM, please contact night-coverage [email or web address]  07/18/2021, 2:33 PM

## 2021-07-18 NOTE — Evaluation (Addendum)
Occupational Therapy Evaluation Patient Details Name: Ryan Savage MRN: [id number] DOB: [birth year] Today's Date: 07/18/2021    History of Present Illness Ryan Savage is a 87 y.o. male presenting with new onset of right sided visual field deficit, confusion, and word-finding difficulties; MRI brain reveals a large left temporal and occipital lobe ischemic infarction; chronic B cerebellar infarcts. Past medical history significant of atrial fibrillation not on anticoagulation due to falls, HTN, HLD, hx of CVA in 4/22, OSA, [name], SIADH, systolic CHF, SH x 3 in [id number]   Clinical Impression   PTA pt lives independently with his wife. Pt demonstrates a significant functional decline due to deficits listed below and is very high fall risk. Feel pt would greatly benefit from rehab at CIR to maximize functional level of independence and facilitate safe DC home with wife. Will follow acutely.     Follow Up Recommendations  CIR    Equipment Recommendations  3 in 1 bedside commode (to use as shower seat)    Recommendations for Other Services Rehab consult;Speech consult     Precautions / Restrictions Precautions Precautions: Fall      Mobility Bed Mobility Overal bed mobility: Needs Assistance Bed Mobility: Supine to Sit     Supine to sit: Supervision          Transfers Overall transfer level: Needs assistance Equipment used: 1 person hand held assist Transfers: Sit to/from Stand Sit to Stand: Min assist         General transfer comment: initial posterior lean    Balance Overall balance assessment: Needs assistance;History of Falls   Sitting balance-Leahy Scale: Good   Postural control: Posterior lean   Standing balance-Leahy Scale: Poor Standing balance comment: reaching out for support                           ADL either performed or assessed with clinical judgement   ADL Overall ADL's : Needs assistance/impaired Eating/Feeding: Set  up;Sitting;Supervision/ safety Eating/Feeding Details (indicate cue type and reason): pt unablet o locate all objects on tray Grooming: Minimal assistance;Standing   Upper Body Bathing: Set up;Supervision/ safety;Sitting   Lower Body Bathing: Minimal assistance;Sit to/from stand   Upper Body Dressing : Set up;Supervision/safety;Sitting   Lower Body Dressing: Minimal assistance;Sit to/from stand   Toilet Transfer: Minimal assistance;Ambulation   Toileting- Clothing Manipulation and Hygiene: Sit to/from stand;Min guard       Functional mobility during ADLs: Minimal assistance General ADL Comments: Difficulty locating items during task; difficulty sequencing multistep tasks     Vision Baseline Vision/History: 1 Wears glasses Patient Visual Report: Blurring of vision;Peripheral vision impairment Vision Assessment?: Yes Eye Alignment: Within Functional Limits Ocular Range of Motion: Within Functional Limits Alignment/Gaze Preference: Within Defined Limits Tracking/Visual Pursuits: Decreased smoothness of horizontal tracking;Decreased smoothness of vertical tracking Saccades: Decreased speed of saccadic movement;Additional head turns occurred during testing;Additional eye shifts occurred during testing Convergence: Within functional limits Visual Fields: Right visual field deficit (central vision appears to be affected; unable to read)     Perception Perception Perception Tested?: Yes Perception Deficits: Spatial orientation Spatial deficits: will further assess   Praxis Praxis Praxis-Other Comments: difficulty with organizing tasks    Pertinent Vitals/Pain Pain Assessment: No/denies pain     Hand Dominance Right   Extremity/Trunk Assessment Upper Extremity Assessment Upper Extremity Assessment: RUE deficits/detail RUE Deficits / Details: slightly weaker than L but functional; appesar to have minimal difficulty with in-hand manipulation skills; reports that sensation is  "  a little" difficult RUE Sensation: decreased proprioception (affected by vision. Pt reports being concerned that he will injure his R hand/arm becuase he can not see it) RUE Coordination: decreased fine motor   Lower Extremity Assessment Lower Extremity Assessment: Defer to PT evaluation Numbness in B feet; at risk for wounds   Cervical / Trunk Assessment Cervical / Trunk Assessment: Normal   Communication Communication Communication: Expressive difficulties   Cognition Arousal/Alertness: Awake/alert Behavior During Therapy: WFL for tasks assessed/performed Overall Cognitive Status: Difficult to assess Area of Impairment: Attention;Memory;Following commands;Safety/judgement;Awareness;Problem solving                   Current Attention Level: Selective Memory: Decreased short-term memory Following Commands: Follows one step commands consistently;Follows multi-step commands inconsistently Safety/Judgement: Decreased awareness of safety Awareness: Emergent Problem Solving: Slow processing;Difficulty sequencing General Comments: Given 3 step task. Pt did not perform correctly; easily distracted   General Comments       Exercises     Shoulder Instructions      Home Living Family/patient expects to be discharged to:: Private residence Living Arrangements: Spouse/significant other Available Help at Discharge: Family;Available 24 hours/day (elderly wife) Type of Home: House Home Access: Stairs to enter     Home Layout: One level     Bathroom Shower/Tub: Walk-in Hydrologist: Standard Bathroom Accessibility: Yes How Accessible: Accessible via walker Home Equipment: Grab bars - tub/shower   Additional Comments: Drives, very active; drives      Prior Functioning/Environment Level of Independence: Independent                 OT Problem List: Decreased strength;Decreased activity tolerance;Impaired balance (sitting and/or  standing);Impaired vision/perception;Decreased coordination;Decreased cognition;Decreased safety awareness;Decreased knowledge of use of DME or AE      OT Treatment/Interventions: Self-care/ADL training;Therapeutic exercise;Neuromuscular education;DME and/or AE instruction;Therapeutic activities;Cognitive remediation/compensation;Visual/perceptual remediation/compensation;Patient/family education;Balance training    OT Goals(Current goals can be found in the care plan section) Acute Rehab OT Goals Patient Stated Goal: for vision to get better OT Goal Formulation: With patient Time For Goal Achievement: 08/01/21 Potential to Achieve Goals: Good  OT Frequency: Min 2X/week   Barriers to D/C:            Co-evaluation              AM-PAC OT "6 Clicks" Daily Activity     Outcome Measure Help from another person eating meals?: A Little Help from another person taking care of personal grooming?: A Little Help from another person toileting, which includes using toliet, bedpan, or urinal?: A Little Help from another person bathing (including washing, rinsing, drying)?: A Little Help from another person to put on and taking off regular upper body clothing?: A Little Help from another person to put on and taking off regular lower body clothing?: A Little 6 Click Score: 18   End of Session Equipment Utilized During Treatment: Gait belt Nurse Communication: Mobility status  Activity Tolerance: Patient tolerated treatment well Patient left: in chair;with call bell/phone within reach;with chair alarm set  OT Visit Diagnosis: Unsteadiness on feet (R26.81);Muscle weakness (generalized) (M62.81);Other abnormalities of gait and mobility (R26.89);History of falling (Z91.81);Low vision, both eyes (H54.2);Other symptoms and signs involving cognitive function                Time: [id number] OT Time Calculation (min): 37 min Charges:  OT General Charges $OT Visit: 1 Visit OT Evaluation $OT Eval  Moderate Complexity: 1 Mod OT Treatments $Self Care/Home Management :  8-22 mins  Maurie Boettcher, OT/L   Acute OT Clinical Specialist Acute Rehabilitation Services Pager [phone] Office [phone]   [facility] 07/18/2021, 9:54 AM

## 2021-07-18 NOTE — Evaluation (Signed)
Physical Therapy Evaluation Patient Details Name: Ryan Savage MRN: [id number] DOB: 14-Oct-1929 Today's Date: 07/18/2021   History of Present Illness  Ryan Savage is a 87 y.o. male presenting with new onset of right sided visual field deficit, confusion, and word-finding difficulties; MRI brain reveals a large left temporal and occipital lobe ischemic infarction; chronic B cerebellar infarcts. Past medical history significant of atrial fibrillation not on anticoagulation due to falls, HTN, HLD, hx of CVA in 4/22, OSA, [name], SIADH, systolic CHF, SH x 3 in [id number]  Clinical Impression  Patient presents with decreased mobility due to vision deficits, decreased safety awareness, decreased balance, poor deficit awareness, and generalized weakness.  He will benefit from skilled PT in the acute setting and from follow up inpatient rehab stay prior to home to maximize his safety, compensatory techniques and for caregiver education.    Follow Up Recommendations CIR;Supervision/Assistance - 24 hour    Equipment Recommendations  Other (comment) (TBA)    Recommendations for Other Services       Precautions / Restrictions Precautions Precautions: Fall Precaution Comments: R sided visual deficits      Mobility  Bed Mobility Overal bed mobility: Needs Assistance Bed Mobility: Supine to Sit     Supine to sit: Supervision     General bed mobility comments: up in chair    Transfers Overall transfer level: Needs assistance Equipment used: None Transfers: Sit to/from Stand Sit to Stand: Supervision         General transfer comment: back of legs braced against chair for balance, flexed posture to compensate for balance deficits  Ambulation/Gait Ambulation/Gait assistance: Min guard Gait Distance (Feet): 100 Feet (x 2) Assistive device: None Gait Pattern/deviations: Step-through pattern;Trunk flexed;Wide base of support;Staggering left;Decreased stride length;Ataxic     General Gait  Details: flexed and holding R hand out initially to grope for balance as reports not seeing items on the R and afraid to run into something, in open hallway navigated reasonably well, though veers to L at times and stops to talk as difficulty multitasking and noted posterior LOB with min A for safety while talking  Stairs            Wheelchair Mobility    Modified Rankin (Stroke Patients Only) Modified Rankin (Stroke Patients Only) Modified Rankin: Moderately severe disability     Balance Overall balance assessment: Needs assistance   Sitting balance-Leahy Scale: Good   Postural control: Posterior lean   Standing balance-Leahy Scale: Poor Standing balance comment: stands without UE support, but back of legs against chair for balance with posterior bias                             Pertinent Vitals/Pain Pain Assessment: No/denies pain    Home Living Family/patient expects to be discharged to:: Private residence Living Arrangements: Spouse/significant other Available Help at Discharge: Family;Available 24 hours/day Type of Home: House Home Access: Stairs to enter Entrance Stairs-Rails: Chemical engineer of Steps: 4 Home Layout: One level Home Equipment: Grab bars - tub/shower Additional Comments: Drives, very active    Prior Function Level of Independence: Independent               Hand Dominance   Dominant Hand: Right    Extremity/Trunk Assessment   Upper Extremity Assessment Upper Extremity Assessment: Defer to OT evaluation RUE Deficits / Details: slightly weaker than L but functional; appesar to have minimal difficulty with in-hand manipulation skills; reports that sensation  is "a little" difficult RUE Sensation: decreased proprioception (affected by vision. Pt reports being concerned that he will injure his R hand/arm becuase he can not see it) RUE Coordination: decreased fine motor    Lower Extremity Assessment Lower  Extremity Assessment: RLE deficits/detail;LLE deficits/detail RLE Deficits / Details: AROM WFL, strength grossly 4/5 throughout RLE Sensation: history of peripheral neuropathy RLE Coordination: decreased gross motor LLE Deficits / Details: AROM WFL, strength grossly 4/5 throughout LLE Sensation: history of peripheral neuropathy    Cervical / Trunk Assessment Cervical / Trunk Assessment: Normal  Communication   Communication: Expressive difficulties  Cognition Arousal/Alertness: Awake/alert Behavior During Therapy: WFL for tasks assessed/performed Overall Cognitive Status: Impaired/Different from baseline Area of Impairment: Orientation                 Orientation Level: Disoriented to;Place [facility]) Current Attention Level: Selective Memory: Decreased short-term memory Following Commands: Follows one step commands with increased time Safety/Judgement: Decreased awareness of safety Awareness: Emergent Problem Solving: Slow processing;Difficulty sequencing General Comments: Given 3 step task. Pt did not perform correctly; easily distracted      General Comments General comments (skin integrity, edema, etc.): Could not see the "[place]" on white board in room when cueing to help him remember which hospital he was in since he stated "[facility]".  Trying to read his own name written underneath, but did not recognize it with visual disturbance.    Exercises     Assessment/Plan    PT Assessment Patient needs continued PT services  PT Problem List Decreased strength;Decreased mobility;Decreased safety awareness;Decreased balance;Decreased knowledge of use of DME;Decreased cognition;Decreased coordination;Impaired sensation       PT Treatment Interventions DME instruction;Therapeutic activities;Cognitive remediation;Patient/family education;Therapeutic exercise;Gait training;Stair training;Balance training;Neuromuscular re-education;Functional mobility training     PT Goals (Current goals can be found in the Care Plan section)  Acute Rehab PT Goals Patient Stated Goal: get stronger PT Goal Formulation: With patient Time For Goal Achievement: 08/01/21 Potential to Achieve Goals: Good    Frequency Min 4X/week   Barriers to discharge        Co-evaluation               AM-PAC PT "6 Clicks" Mobility  Outcome Measure Help needed turning from your back to your side while in a flat bed without using bedrails?: A Little Help needed moving from lying on your back to sitting on the side of a flat bed without using bedrails?: A Little Help needed moving to and from a bed to a chair (including a wheelchair)?: A Little Help needed standing up from a chair using your arms (e.g., wheelchair or bedside chair)?: A Little Help needed to walk in hospital room?: A Little Help needed climbing 3-5 steps with a railing? : A Lot 6 Click Score: 17    End of Session Equipment Utilized During Treatment: Gait belt Activity Tolerance: Patient tolerated treatment well Patient left: in chair;with call bell/phone within reach;with chair alarm set   PT Visit Diagnosis: Other abnormalities of gait and mobility (R26.89);Other symptoms and signs involving the nervous system (R29.898);Ataxic gait (R26.0)    Time: [id number] PT Time Calculation (min) (ACUTE ONLY): 25 min   Charges:   PT Evaluation $PT Eval Moderate Complexity: 1 Mod PT Treatments $Gait Training: 8-22 mins        Magda Kiel, PT Acute Rehabilitation Services [id number] [phone] 07/18/2021   Reginia Naas 07/18/2021, 11:01 AM

## 2021-07-18 NOTE — Progress Notes (Signed)
Patient resting comfortably on CPAP. No assistance needed from RT at this time. 

## 2021-07-18 NOTE — Progress Notes (Signed)
Inpatient Rehab Admissions Coordinator:   Per therapy recommendations,  patient was screened for CIR candidacy by Clemens Catholic, MS, CCC-SLP. At this time, Pt. Is supervision with transfers and ambulating min-guard. I anticipate that he will likely progress to supervision level in the next few days. I will not place CIR consult at this time. [facility] team will follow and consider consult if Pt. Does not continue to progress.  Please contact me any with questions.  Clemens Catholic, [place], [place] Admissions Coordinator  [phone] ([place]) [phone] (office)

## 2021-07-19 DIAGNOSIS — Z8673 Personal history of transient ischemic attack (TIA), and cerebral infarction without residual deficits: Secondary | ICD-10-CM

## 2021-07-19 LAB — MRSA NEXT GEN BY PCR, NASAL: MRSA by PCR Next Gen: NOT DETECTED

## 2021-07-19 LAB — URINALYSIS, ROUTINE W REFLEX MICROSCOPIC
Bilirubin Urine: NEGATIVE
Glucose, UA: NEGATIVE mg/dL
Ketones, ur: NEGATIVE mg/dL
Nitrite: POSITIVE — AB
Protein, ur: NEGATIVE mg/dL
Specific Gravity, Urine: 1.013 (ref 1.005–1.030)
WBC, UA: >50 WBC/hpf — ABNORMAL HIGH (ref 0–5)
pH: 6 (ref 5.0–8.0)

## 2021-07-19 MED ORDER — SODIUM CHLORIDE 0.9 % IV SOLN
1.0000 g | INTRAVENOUS | Status: DC
  Administered 2021-07-19: 1 g via INTRAVENOUS
  Filled 2021-07-19 (×2): qty 10

## 2021-07-19 NOTE — Progress Notes (Signed)
Physical Therapy Treatment Patient Details Name: Ryan Savage MRN: [id number] DOB: May 02, 1929 Today's Date: 07/19/2021    History of Present Illness Ryan Savage is a 87 y.o. male presenting with new onset of right sided visual field deficit, confusion, and word-finding difficulties; MRI brain reveals a large left temporal and occipital lobe ischemic infarction; chronic B cerebellar infarcts. Past medical history significant of atrial fibrillation not on anticoagulation due to falls, HTN, HLD, hx of CVA in 4/22, OSA, [name], SIADH, systolic CHF, SH x 3 in [id number]    PT Comments    Pt progressing steadily,  Emphasis on family education with stress on safety, sit to standing, BERG activity, gait training with cane, RW and no AD to make an AD determination.    Follow Up Recommendations  Home health PT;Supervision - Intermittent;Supervision/Assistance - 24 hour     Equipment Recommendations  Rolling walker with 5" wheels    Recommendations for Other Services       Precautions / Restrictions Precautions Precautions: Fall Precaution Comments: R sided visual deficits    Mobility  Bed Mobility               General bed mobility comments: up in chair    Transfers Overall transfer level: Needs assistance Equipment used: None Transfers: Sit to/from Stand Sit to Stand: Min guard         General transfer comment: no assist needed, uses hands appropriately.  Initially supports himself with legs against the chair.  Ambulation/Gait Ambulation/Gait assistance: Min guard Gait Distance (Feet): 180 Feet (x2, 100 x1) Assistive device: Rolling walker (2 wheeled);Straight cane;None Gait Pattern/deviations: Step-through pattern   Gait velocity interpretation: <1.8 ft/sec, indicate of risk for recurrent falls General Gait Details: mildly flexed posture, increasingly more steady progressing from no AD, to cane to RW.  Pt used the cane and the RW well and generally safe.  pt's stability  is compromised with scanning and pt misses a significant amount of visual information in the right field during gait/   Stairs             Wheelchair Mobility    Modified Rankin (Stroke Patients Only) Modified Rankin (Stroke Patients Only) Modified Rankin: Moderate disability     Balance Overall balance assessment: Needs assistance   Sitting balance-Leahy Scale: Good     Standing balance support: No upper extremity supported Standing balance-Leahy Scale: Fair Standing balance comment: can maintain narrowed to wider BOS without assist, but more guarded the more narrow the BOS or without AD                 Standardized Balance Assessment Standardized Balance Assessment : Berg Balance Test Berg Balance Test Sit to Stand: Able to stand  independently using hands Standing Unsupported: Able to stand 30 seconds unsupported Sitting with Back Unsupported but Feet Supported on Floor or Stool: Able to sit safely and securely 2 minutes Stand to Sit: Sits safely with minimal use of hands Transfers: Able to transfer safely, definite need of hands Standing Unsupported with Eyes Closed: Able to stand 10 seconds with supervision Standing Ubsupported with Feet Together: Able to place feet together independently but unable to hold for 30 seconds From Standing, Reach Forward with Outstretched Arm: Can reach forward >12 cm safely (5") From Standing Position, Pick up Object from Floor: Able to pick up shoe, needs supervision From Standing Position, Turn to Look Behind Over each Shoulder: Looks behind one side only/other side shows less weight shift Turn 360 Degrees: Able to  turn 360 degrees safely but slowly Standing Unsupported, Alternately Place Feet on Step/Stool: Able to complete 4 steps without aid or supervision        Cognition Arousal/Alertness: Awake/alert Behavior During Therapy: WFL for tasks assessed/performed Overall Cognitive Status: Impaired/Different from baseline                      Current Attention Level: Selective Memory: Decreased short-term memory Following Commands: Follows one step commands with increased time Safety/Judgement: Decreased awareness of safety Awareness: Emergent   General Comments: completed 2 step task, still distractable      Exercises      General Comments General comments (skin integrity, edema, etc.): Wife present and participated in a discussion of safety expectations post d/c and ramifications of R field deficit on safety and stability.      Pertinent Vitals/Pain Pain Assessment: No/denies pain    Home Living                      Prior Function            PT Goals (current goals can now be found in the care plan section) Acute Rehab PT Goals Patient Stated Goal: get stronger PT Goal Formulation: With patient Time For Goal Achievement: 08/01/21 Potential to Achieve Goals: Good Progress towards PT goals: Progressing toward goals    Frequency    Min 4X/week      PT Plan Discharge plan needs to be updated    Co-evaluation              AM-PAC PT "6 Clicks" Mobility   Outcome Measure  Help needed turning from your back to your side while in a flat bed without using bedrails?: A Little Help needed moving from lying on your back to sitting on the side of a flat bed without using bedrails?: A Little Help needed moving to and from a bed to a chair (including a wheelchair)?: A Little Help needed standing up from a chair using your arms (e.g., wheelchair or bedside chair)?: A Little Help needed to walk in hospital room?: A Little Help needed climbing 3-5 steps with a railing? : A Little 6 Click Score: 18    End of Session   Activity Tolerance: Patient tolerated treatment well Patient left: in chair;with call bell/phone within reach;with chair alarm set Nurse Communication: Mobility status PT Visit Diagnosis: Other abnormalities of gait and mobility (R26.89);Other symptoms  and signs involving the nervous system (R29.898);Ataxic gait (R26.0)     Time: 1325-1410 PT Time Calculation (min) (ACUTE ONLY): 45 min  Charges:  $Gait Training: 8-22 mins $Therapeutic Activity: 8-22 mins $Neuromuscular Re-education: 8-22 mins                     07/19/2021  Ginger Carne., PT Acute Rehabilitation Services [phone]  (pager) [phone]  (office)   Tessie Fass Vaidehi Braddy 07/19/2021, 3:14 PM

## 2021-07-19 NOTE — Consult Note (Addendum)
Watchman Consult Note  Date:  07/20/2021   ID:  Ryan Savage, DOB August 03, 1929, MRN [id number]  PCP:  Lajean Manes, MD  Cardiologist:  Dr. Fransico Him, MD  Referring Physician: Dr. Tyrell Antonio, MD    CC: to discuss Watchman implant    History of Present Illness: Ryan Savage is a 87 y.o. male referred by Dr. Tyrell Antonio for evaluation of atrial fibrillation and stroke prevention. He has a complicated hx including chronic atrial fibrillation not on anticoagulation due to hx of subdural hematomas and recurrent falls in 2019. 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   The patient has been evaluated by their referring physician and is felt to be a poor candidate for long term Leonard due to recurrent falls, and hx of multiple subdural hematomas. He presented to [facility] on 07/17/21 with c/o visual changes and confusion found to have a large early subacute PCA territory cortical subcortical infarct predominantly in the left temporal and occipital lobes. He therefore is being seen today for Watchman evaluation.   Mr. Deschenes has been followed by [name] for many years for his atrial fibrillation, [name] and valve disease. He has known chronic atrial fibrillation previously anticoagulated with Coumadin. He was previously found to have an LVEF at 30-35% with hypokinesis of the inferior, mid to apical anterior septal, inferior septal and apical anterior as well as apical myocardium. He subsequently underwent an [facility] 11/21/2017 which showed severe two vessel [name] with CTO of the LAD and RCA vessels  with extensive collaterals. Plan was for continued medical management. He has a history of bilateral subdural hematomas with 9 mm right to left shift and his INR was reversed with vitamin K and K Centra 01/28/2018. He underwent bilateral bur holes for evacuation of subacute to chronic subdural hematomas. He was discharged home on 01/29/2018 and was in rehab. He was then readmitted twice with recurrent bleeds.  He lives at home with his wife and is able to perform ADL/IADLs without difficulty. He does not have baseline SOB and denies anginal symptoms. Since his most recent stroke he has been working very well with PT with recommendations for rolling walker with discharge to home. He has no residual motor deficits however does report right sided visual disturbances since his stroke. He is asking for referral to opthalmology with stroke specialty. He has no awareness of his atrial fibrillation with palpitations. He denies orthopnea, PND, lower extremity edema, dizziness, presyncope, syncope, or bleeding.   Past Medical History:  Diagnosis Date   Aortic stenosis 05/22/2018   Mild with mean AVG 29mHg by echo 05/2018   Atrial fibrillation, chronic (HCC)    Not on anticoagulation due to history of bilateral subdural bleeds due to recurrent falls   BPH (benign prostatic hypertrophy)    [name] (coronary artery disease), native coronary artery    Severe two-vessel [name] with chronically occluded LAD and RCA with widely patent left main, intermediate, and left circumflex branches.  On medical management.  He has extensive collaterals.   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 fib with RVR.  Repeat echo 05/2018 with EF 45-50%.   [name] (degenerative joint disease)    GERD (gastroesophageal reflux disease)    Hyperlipidemia    LDL goal less then 100   Hypertension    Microscopic  hematuria    [name]   Mitral valve prolapse    with mild MR   OSA (obstructive sleep apnea)    Pulmonary HTN (HCC)    PASP 8mHg by echo 05/2018 but normal pressures on right heart cath.   SDH (subdural hematoma) ([facility] 01/2018   SIADH (syndrome of inappropriate ADH production) ([facility]    Skin cancer    "side of my nose" (11/19/2017)   Stroke ([facility] 12/2017   "fully recovered"   Thrombocytopenia ([facility]    Mild- platelet count 143,000 on 02/2011, stable 08/2011   Past Surgical History:  Procedure Laterality Date   APPENDECTOMY  1950   BACK SURGERY     BURR HOLE Bilateral 01/25/2018   Procedure: BHaskell Flirt  Surgeon: CKary Kos MD;  [place] [facility]  Service: Neurosurgery;  Laterality: Bilateral;   BURR HOLE Left 02/20/2018   Procedure: BURR HOLES for subdural hematoma;  Surgeon: JNewman Pies MD;  [place] [facility]  Service: Neurosurgery;  Laterality: Left;   BURR HOLE Left 03/20/2018   Procedure: Craniotomy for subdural hematoma;  Surgeon: EKristeen Miss MD;  [place] [facility]  Service: Neurosurgery;  Laterality: Left;   FOREARM FRACTURE SURGERY Right [place]  ruptured disc repair   RIGHT/LEFT HEART CATH AND CORONARY ANGIOGRAPHY N/A 11/21/2017   Procedure: RIGHT/LEFT HEART CATH AND CORONARY ANGIOGRAPHY;  Surgeon: CSherren Mocha MD;  [facility];  Service: Cardiovascular;  Laterality: N/A;   SKIN CANCER EXCISION Left    "side of my nose"     Current Facility-Administered Medications  Medication Dose Route Frequency Provider Last Rate Last Admin   acetaminophen (TYLENOL) tablet 650 mg  650 mg Oral Q4H PRN WOrma Flaming MD       Or   acetaminophen (TYLENOL) 160 MG/5ML solution 650 mg  650 mg Per Tube Q4H PRN WOrma Flaming MD       Or   acetaminophen (TYLENOL) suppository 650 mg  650 mg Rectal Q4H PRN WOrma Flaming MD       ascorbic acid (VITAMIN C) tablet 500 mg  500 mg Oral Daily WOrma Flaming MD   500 mg at 07/19/21 [id number]   aspirin EC tablet 81 mg  81 mg Oral Daily WOrma Flaming MD   81 mg at 07/19/21 0911   cephALEXin (KEFLEX) capsule 500 mg  500 mg Oral Q6H Regalado, Belkys A, MD       cholecalciferol (VITAMIN D3) tablet 2,000 Units  2,000 Units Oral QAuburn Bilberry MD   2,000 Units at 07/18/21 1053   digoxin (LANOXIN) tablet 0.125 mg  0.125 mg Oral Daily WOrma Flaming MD   0.125 mg at 07/19/21 0911   finasteride (PROSCAR) tablet 5 mg  5 mg Oral Daily WOrma Flaming MD   5 mg at 07/19/21 0911   melatonin tablet 3 mg  3 mg Oral QHS WOrma Flaming MD   3 mg at 07/19/21 2341   metoprolol succinate (TOPROL-XL) 24 hr tablet 25 mg  25 mg Oral Daily WOrma Flaming MD   25 mg at 07/18/21 1054   senna-docusate (Senokot-S) tablet 1 tablet  1 tablet Oral QHS  PRN Orma Flaming, MD       simvastatin (ZOCOR) tablet 10 mg  10 mg Oral q1800 Orma Flaming, MD   10 mg at 07/19/21 1721    Allergies:   Methylphenidate derivatives, Warfarin sodium, and Novocain [procaine]   Social History:  The patient  reports that he quit smoking about 64 years ago. His smoking use included cigarettes. He has never used smokeless tobacco. He reports that he does not drink alcohol and does not use drugs.   Family History:  The patient's  family history includes [name] in his brother and father; Colon cancer in his brother and sister; Heart attack in his brother and father; Heart failure in his mother.    ROS:  Please see the history of present illness.  All other systems are reviewed and negative.   PHYSICAL EXAM: VS:  BP 137/73 (BP Location: Right Arm)   Pulse 61   Temp 98.1 F (36.7 C) (Oral)   Resp 20   Ht '5\' 10"'$  (1.778 m)   Wt 79.4 kg   SpO2 97%   BMI 25.12 kg/m  , BMI Body mass index is 25.12 kg/m.  General: Elderly, NAD Neck: Negative for carotid bruits. No JVD Lungs:Clear to ausculation bilaterally. No wheezes, rales, or rhonchi. Breathing is unlabored. Cardiovascular: Irregularly irregular. + murmur Abdomen: Soft,  non-tender, non-distended. No obvious abdominal masses. MSK: Strength and tone appear normal for age. 5/5 in all extremities. Walking with walker  Extremities: No edema. Radial pulses 2+ bilaterally Neuro: Alert and oriented. No focal deficits. No facial asymmetry. MAE spontaneously. Psych: Responds to questions appropriately with normal affect.    EKG: Ekg from 07/17/21  shows atrial fibrillation with HR 73bpm, occasional PVC.   Recent Labs: 07/17/2021: ALT 16; Hemoglobin 12.9; Platelets 146 07/18/2021: BUN 15; Creatinine, Ser 0.90; Potassium 3.9; Sodium 130   Lipid Panel     Component Value Date/Time   CHOL 100 07/18/2021 0235   CHOL 107 01/12/2019 0832   TRIG 58 07/18/2021 0235   HDL 43 07/18/2021 0235   HDL 37 (L) 01/12/2019 0832   CHOLHDL 2.3 07/18/2021 0235   VLDL 12 07/18/2021 0235   LDLCALC 45 07/18/2021 0235   LDLCALC 51 01/12/2019 0832   Wt Readings from Last 3 Encounters:  07/20/21 79.4 kg  03/28/21 78.9 kg  01/10/20 83.2 kg    Other studies Reviewed: Additional studies/ records that were reviewed today include:   Echocardiogram 07/18/21:   1. Left ventricular ejection fraction, by estimation, is 60 to 65%. The  left ventricle has normal function. The left ventricle has no regional  wall motion abnormalities. There is mild concentric left ventricular  hypertrophy. Left ventricular diastolic  function could not be evaluated.   2. Right ventricular systolic function is normal. The right ventricular  size is mildly enlarged. There is mildly elevated pulmonary artery  systolic pressure. The estimated right ventricular systolic pressure is  [id number] mmHg.   3. Right atrial size was severely dilated.   4. Left atrial size was severely dilated.   5. The mitral valve is normal in structure. No evidence of mitral  stenosis. There is mild Mitral Regurgitation. No mitral stenosis.   6. The aortic valve has an indeterminant number of cusps. There is  moderate calcification of the  aortic valve. There is moderate thickening  of the aortic valve. Aortic valve regurgitation is not visualized.  Planimetered AVA was 1.36cm2.      Aortic valve mean gradient measures 9.0 mmHg. Aortic valve  Vmax  measures 2.14 m/s. The mean AVG and Vmax are consistent with mild AS but  DVi is low at 0.24 and SVI is low at 26 which is most consistent with low  flow low gradient moderate AS in the  setting of preserved LVF.   7. Aortic dilatation noted. There is moderate dilatation of the aortic  root and of the ascending aorta, measuring 44 mm.   8. The inferior vena cava is normal in size with <50% respiratory  variability, suggesting right atrial pressure of 8 mmHg.Side by side  compareson of imaged from 03/10/2021 demonstrated no change in the mean AVG  but the SVi has decreased and DVi has  decreased from 0.37 to 0.24. Visually the AV appears slightly more  stesnotic. LVF appears improved.   Head CT Angio 07/17/21:  IMPRESSION: 1. Mild atherosclerosis in the head and neck without a large vessel occlusion or high-grade proximal stenosis. 2. Large early subacute left PCA infarct. 3. Aortic Atherosclerosis (ICD10-I70.0).  ASSESSMENT AND PLAN:  1.  Permanent atrial fibrillation: -Jorgen Eye is a 87 y.o. male who has been referred by Dr. Tyrell Antonio for a Watchman left atrial appendage closure device.  He has a history of permanent atrial fibrillation. This patients CHA2DS2-VASc Score and unadjusted Ischemic Stroke Rate (% per year) is equal to 11.2 % stroke rate/year from a score of 7 which necessitates long term oral anticoagulation to prevent stroke. His HasBled score is 4. Unfortunately, he is not felt to be a long term anticoagulation candidate secondary to prior subdural hematomas and recurrent falls with new subacute cortical stroke this hospital admission. We will plan for hospital discharge and allow him to recover from his acute events. I have discussed the need for short term  anticoagulation prior to and in the post-procedural setting; however will need clearance from neurology before moving forward. If he is deemed to be a candidate, will plan for further OP workup as below for further anatomical assessment for Watchman implant. MD to follow for final recommendations.   Orders Placed This Encounter  Procedures   For home use only DME Walker rolling   SARS CORONAVIRUS 2 (TAT 6-24 HRS) Nasopharyngeal Nasopharyngeal Swab   Urine Culture   MRSA Next Gen by PCR, Nasal   MR BRAIN WO CONTRAST   CT ANGIO NECK W OR WO CONTRAST   CT ANGIO HEAD W OR WO CONTRAST   CBC with Differential/Platelet   Comprehensive metabolic panel   Hemoglobin A1c   Lipid panel   Digoxin level   Basic metabolic panel   Urinalysis, Routine w reflex microscopic   Diet Heart Room service appropriate? Yes; Fluid consistency: Thin   Visual acuity screening   Cardiac monitoring   NIHSS score documentation NIHSS score range: 0-42   Vital signs every 2 hours x 12 hours, then every 4 hours   Notify physician (specify)   Stroke swallow screen - If patient does NOT pass this screen, place order for SLP eval and treat (SLP2) - swallowing evaluation (BSE, MBS and/or diet order as indicated)   RN to notify Physician for appropriate diet after patient passes stroke swallow screen   Modified Stroke Scale (mNIHSS) every 2 hours x 12 hours then every 4 hours   NIH stroke scale   Intake and output   Cardiac Monitoring - Continuous Indefinite   Discuss with patient and document patient's goals for stroke risk factor reduction   Initiate Oral Care Protocol   Initiate Carrier Fluid Protocol  Provide stroke education material to patient and family.   Nurse to provide smoking / tobacco cessation education   If the patient has passed the Stroke Swallow Screen or has a feeding tube, then RN may order General Admission PRN Orders (through manage orders) for the following patient needs: allergy symptoms  (Claritin), cold sores (Carmex), cough (Robitussin DM), eye irritation (Liquifilm Tears), hemorrhoids (Tucks), indigestion (Maalox), minor skin irritation (hydrocortisone cream), muscle pain Suezanne Jacquet Gay), nose irritation (saline nasal spray) and sore throat (Chloraseptic spray).   SCD's   Patient has an active order for admit to inpatient/place in observation   Strict intake and output   Daily weights   Swab Process:   Considerations:   If MRSA PCR Screen is Positive:   Home Health   Do not attempt resuscitation (DNR)   Consult to neurology   Consult to neurology   Consult to hospitalist   OT eval and treat   OT PLAN OF CARE CERT/RE-CERT   PT eval and treat   CPAP   Pulse Oximetry Pulse Oximetry check every 2 hours X 12 hours, then every 4 hours.   Oxygen therapy Mode or (Route): Nasal cannula; Liters Per Minute: 2; Keep 02 saturation: greater than 94 %   SLP eval and treat Reason for evaluation: Cognitive/Language evaluation   ED EKG   EKG 12-Lead   EKG 12-Lead   EKG   ECHOCARDIOGRAM COMPLETE   Saline lock IV   Place in observation (patient's expected length of stay will be less than 2 midnights)   Admit to Inpatient (patient's expected length of stay will be greater than 2 midnights or inpatient only procedure)   Fall precautions   Signed, Kathyrn Drown NP-C 07/20/2021  8:39 AM      Patient seen, examined. Available data reviewed. Agree with findings, assessment, and plan as outlined by Kathyrn Drown, NP-C.  The patient is independently interviewed and examined.  His wife is with him at the bedside.  They are a very nice elderly couple who live independently.  On my exam, the patient is alert, oriented, in no distress.  He has mild difficulty with word finding.  HEENT is normal, JVP is normal, carotid upstrokes are normal without bruits.  Lungs are clear to auscultation bilaterally.  Heart is regular rate and rhythm with a 2/6 mid-to-late systolic murmur consistent with mitral valve  prolapse with mitral regurgitation.  Abdomen is soft and nontender, extremities have no edema.  The patient's clinical situation is reviewed.  He has permanent atrial fibrillation, not on anticoagulation since 2019 when he presented with subdural and intracranial hematomas/hemorrhages requiring surgical evacuation.  He now presents with recurrent ischemic stroke and large subacute left PCA territory cortical and subcortical infarct.  Chronic infarcts have been noted on neuroimaging as well.  We are asked to see him regarding consideration of watchman left atrial appendage occlusion considering his contraindication to long-term anticoagulation.  I reviewed left atrial appendage occlusion with the patient and his wife today.  We discussed risks, indications, and alternatives.  The main question is whether he is a candidate for short-term anticoagulation and what regimen would be most efficacious at the lowest bleeding risk.  It might be reasonable to consider apixaban 2.5 mg twice daily as a periprocedural regimen through at least the first 6 weeks after watchman implantation if he decides to proceed.  I will discuss this further with Dr. Erlinda Hong.  In the meantime, we will arrange an outpatient gated cardiac CTA for watchman evaluation  followed by an outpatient visit with me to review treatment options further.  We will be in touch with the patient to schedule the CTA and the outpatient follow-up visit.  He will be discharged on aspirin 81 mg daily.  Sherren Mocha, M.D. 07/20/2021 1:18 PM

## 2021-07-19 NOTE — Plan of Care (Signed)
Pt Is alert confused, pt does not know what year it is. Pt has had increased urgency to pee. Pt has foul odor urine. Pt is +1 assist to use urinal at bedside, pt unsteady.  Problem: Education: Goal: Knowledge of General Education information will improve Description: Including pain rating scale, medication(s)/side effects and non-pharmacologic comfort measures Outcome: Progressing   Problem: Health Behavior/Discharge Planning: Goal: Ability to manage health-related needs will improve Outcome: Progressing   Problem: Clinical Measurements: Goal: Ability to maintain clinical measurements within normal limits will improve Outcome: Progressing Goal: Will remain free from infection Outcome: Progressing Goal: Diagnostic test results will improve Outcome: Progressing Goal: Respiratory complications will improve Outcome: Progressing Goal: Cardiovascular complication will be avoided Outcome: Progressing   Problem: Activity: Goal: Risk for activity intolerance will decrease Outcome: Progressing   Problem: Nutrition: Goal: Adequate nutrition will be maintained Outcome: Progressing   Problem: Coping: Goal: Level of anxiety will decrease Outcome: Progressing   Problem: Safety: Goal: Ability to remain free from injury will improve Outcome: Progressing   Problem: Skin Integrity: Goal: Risk for impaired skin integrity will decrease Outcome: Progressing   Problem: Education: Goal: Knowledge of disease or condition will improve Outcome: Progressing Goal: Knowledge of secondary prevention will improve Outcome: Progressing Goal: Knowledge of patient specific risk factors addressed and post discharge goals established will improve Outcome: Progressing Goal: Individualized Educational Video(s) Outcome: Progressing

## 2021-07-19 NOTE — TOC CAGE-AID Note (Signed)
Transition of Care [facility]) - CAGE-AID Screening   Patient Details  Name: Robertjames Eisel MRN: [id number] Date of Birth: [birth year]  Transition of Care [facility]) CM/SW Contact:    Bethann Berkshire, [place] Phone Number: 07/19/2021, 8:55 AM   Clinical Narrative:  Pt unable to participate in CAGE-AID.  CAGE-AID Screening: Substance Abuse Screening unable to be completed due to: : Patient unable to participate             Substance Abuse Education Offered: No

## 2021-07-19 NOTE — Progress Notes (Signed)
PROGRESS NOTE    Ryan Savage  ZN:[id number] DOB: [birth year] DOA: 07/17/2021 PCP: Lajean Manes, MD   Brief Narrative: -year-old with past medical history significant for A. fib not on anticoagulation due to falls, hypertension, hyperlipidemia, history of CVA 4/22, OSA, [name], SIADH, systolic heart failure, subdural hematoma x3 in 2019 who presented with vision changes,  confusion.  Right brain showed large acute early subacute left PCA territory cortical subcortical infarct predominantly within the left temporal and occipital lobes with mild petechial hemorrhage with thin portions of the infarct territory.    Assessment & Plan:   Principal Problem:   Acute CVA (cerebrovascular accident) ([place]) Active Problems:   Permanent atrial fibrillation (HCC)   Benign essential HTN   SIADH (syndrome of inappropriate ADH production) (HCC)   Hyperlipidemia   OSA (obstructive sleep apnea)   [name] (coronary artery disease)   Chronic systolic CHF (congestive heart failure), NYHA class 2 ([place])   1-Acute CVA: Patient presented with new right side visual field deficit, confusion and aphasia. MRI reveals large acute early subacute left PCA territory cortical subcortical infarct. Neurology consulted and following. Currently on a baby aspirin.  Follow neurology recommendation. Awaiting cardiology evaluation to discussed risk benefit of anticoagulation and evaluation for watchman procedure.  LDL; 45 A1c; 5.7  2-Permanent A. fib: Continue with beta-blocker and digoxin. Not on anticoagulation due to history of subdural and fall. Follow recommendation from neurology. Awaiting cardiology evaluation for watchman procedure.   Hypertension: Continue with Norvasc and metoprolol  Hyperlipidemia: Continue with the statins LDL:45  [name]: Continue with statin and aspirin  Chronic systolic heart failure, compensated OSA: Continue with CPAP  SIADH, Hyponatremia; stable.      Estimated body mass index is  25.21 kg/m as calculated from the following:   Height as of this encounter: '5\' 10"'$  (1.778 m).   Weight as of this encounter: 79.7 kg.   DVT prophylaxis: SCDs Code Status: DNR Family Communication: Discussed with wife Disposition Plan:  Status is: Observation  The patient will require care spanning > 2 midnights and should be moved to inpatient because: Inpatient level of care appropriate due to severity of illness  Dispo: The patient is from: Home              Anticipated d/c is to: Home with [facility]              Patient currently is not medically stable to d/c.   Difficult to place patient No        Consultants:  Neurology   Procedures:  ECHO Left Ventricle: Left ventricular ejection fraction, by estimation, is 60  to 65%. The left ventricle has normal function. The left ventricle has no  regional wall motion abnormalities. The left ventricular internal cavity  size was normal in size. There is   mild concentric left ventricular hypertrophy. Left ventricular diastolic  function could not be evaluated.  Carotid doppler.   Antimicrobials:    Subjective: He is feeling well, stable.  Urine with bad odor.  Objective: Vitals:   07/19/21 0500 07/19/21 0720 07/19/21 1154 07/19/21 1506  BP:  131/68 137/65 117/70  Pulse:  63 (!) 47 (!) 53  Resp:  '18 18 20  '$ Temp:  97.6 F (36.4 C) 97.7 F (36.5 C)   TempSrc:  Oral Oral   SpO2:  97% 98% 99%  Weight: 79.7 kg     Height:        Intake/Output Summary (Last 24 hours) at 07/19/2021 1743 Last data filed  at 07/19/2021 0953 Gross per 24 hour  Intake 220 ml  Output 900 ml  Net -680 ml    Filed Weights   07/17/21 1239 07/18/21 0132 07/19/21 0500  Weight: 78.9 kg 76 kg 79.7 kg    Examination:  General exam: NAD Respiratory system: CTA Cardiovascular system: S 1, S 2 RRR Gastrointestinal system: BS present, soft nt Central nervous system: alert, follows command Extremities: No edema   Data Reviewed: I have personally  reviewed following labs and imaging studies  CBC: Recent Labs  Lab 07/17/21 1331  WBC 7.6  NEUTROABS 4.5  HGB 12.9*  HCT 38.2*  MCV 92.9  PLT 146*    Basic Metabolic Panel: Recent Labs  Lab 07/17/21 1331 07/18/21 0235  NA 128* 130*  K 4.0 3.9  CL 93* 93*  CO2 24 27  GLUCOSE 117* 92  BUN 16 15  CREATININE 0.93 0.90  CALCIUM 9.3 9.4    GFR: Estimated Creatinine Clearance: 56.3 mL/min (by C-G formula based on SCr of 0.9 mg/dL). Liver Function Tests: Recent Labs  Lab 07/17/21 1331  AST 27  ALT 16  ALKPHOS 44  BILITOT 1.5*  PROT 7.8  ALBUMIN 3.8    No results for input(s): LIPASE, AMYLASE in the last 168 hours. No results for input(s): AMMONIA in the last 168 hours. Coagulation Profile: No results for input(s): INR, PROTIME in the last 168 hours. Cardiac Enzymes: No results for input(s): CKTOTAL, CKMB, CKMBINDEX, TROPONINI in the last 168 hours. BNP (last 3 results) No results for input(s): PROBNP in the last 8760 hours. HbA1C: Recent Labs    07/18/21 0235  HGBA1C 5.7*    CBG: No results for input(s): GLUCAP in the last 168 hours. Lipid Profile: Recent Labs    07/18/21 0235  CHOL 100  HDL 43  LDLCALC 45  TRIG 58  CHOLHDL 2.3    Thyroid Function Tests: No results for input(s): TSH, T4TOTAL, FREET4, T3FREE, THYROIDAB in the last 72 hours. Anemia Panel: No results for input(s): VITAMINB12, FOLATE, FERRITIN, TIBC, IRON, RETICCTPCT in the last 72 hours. Sepsis Labs: No results for input(s): PROCALCITON, LATICACIDVEN in the last 168 hours.  Recent Results (from the past 240 hour(s))  SARS CORONAVIRUS 2 (TAT 6-24 HRS) Nasopharyngeal Nasopharyngeal Swab     Status: None   Collection Time: 07/17/21 11:42 PM   Specimen: Nasopharyngeal Swab  Result Value Ref Range Status   SARS Coronavirus 2 NEGATIVE NEGATIVE Final    Comment: (NOTE) SARS-CoV-2 target nucleic acids are NOT DETECTED.  The SARS-CoV-2 RNA is generally detectable in upper and  lower respiratory specimens during the acute phase of infection. Negative results do not preclude SARS-CoV-2 infection, do not rule out co-infections with other pathogens, and should not be used as the sole basis for treatment or other patient management decisions. Negative results must be combined with clinical observations, patient history, and epidemiological information. The expected result is Negative.  Fact Sheet for Patients: SugarRoll.be  Fact Sheet for Healthcare Providers: https://www.woods-mathews.com/  This test is not yet approved or cleared by the [place] FDA and  has been authorized for detection and/or diagnosis of SARS-CoV-2 by FDA under an Emergency Use Authorization (EUA). This EUA will remain  in effect (meaning this test can be used) for the duration of the COVID-19 declaration under Se ction 564(b)(1) of the Act, 21 U.S.C. section 360bbb-3(b)(1), unless the authorization is terminated or revoked sooner.  Performed at [facility], [place] [address], [place], [place] [address]  MRSA Next Gen by PCR, Nasal     Status: None   Collection Time: 07/19/21 10:05 AM   Specimen: Nasal Mucosa; Nasal Swab  Result Value Ref Range Status   MRSA by PCR Next Gen NOT DETECTED NOT DETECTED Final    Comment: (NOTE) The GeneXpert MRSA Assay (FDA approved for NASAL specimens only), is one component of a comprehensive MRSA colonization surveillance program. It is not intended to diagnose MRSA infection nor to guide or monitor treatment for MRSA infections. Test performance is not FDA approved in patients less than 86 years old. Performed at [facility], [place] [address], [place], [place] [address]           Radiology Studies: CT ANGIO HEAD W OR WO CONTRAST  Result Date: 07/17/2021 CLINICAL DATA:  Stroke/TIA, assess intracranial arteries. Visual disturbance. Left PCA infarct on MRI. EXAM: CT ANGIOGRAPHY HEAD AND  NECK TECHNIQUE: Multidetector CT imaging of the head and neck was performed using the standard protocol during bolus administration of intravenous contrast. Multiplanar CT image reconstructions and MIPs were obtained to evaluate the vascular anatomy. Carotid stenosis measurements (when applicable) are obtained utilizing NASCET criteria, using the distal internal carotid diameter as the denominator. CONTRAST:  72m OMNIPAQUE IOHEXOL 350 MG/ML SOLN COMPARISON:  Head MRI 07/17/2021. Head MRA 03/10/2021. Head and neck CTA 12/08/2017. FINDINGS: CT HEAD FINDINGS Brain: As seen on today's MRI, there is a large early subacute left PCA infarct with mild petechial hemorrhage. A small chronic left MCA territory infarct is again noted involving the frontal operculum and insula, and there are chronic bilateral cerebellar infarcts. Hypodensities elsewhere in the cerebral white matter bilaterally are nonspecific but compatible with mild chronic small vessel ischemic disease. There is mild-to-moderate cerebral atrophy. Vascular: Calcified atherosclerosis at the skull base. Skull: Left frontoparietal craniotomy. Right frontal and parietal burr holes. Sinuses: Visualized paranasal sinuses and mastoid air cells are clear. Orbits: Unremarkable. Review of the MIP images confirms the above findings CTA NECK FINDINGS Aortic arch: Standard 3 vessel aortic arch with moderate atherosclerotic plaque. No significant arch vessel origin stenosis. Right carotid system: Patent without evidence of stenosis or dissection. Left carotid system: Patent with a small amount of calcified and soft plaque at the carotid bifurcation. No evidence of a significant stenosis or dissection. Vertebral arteries: The vertebral arteries are patent with the left being mildly dominant. Calcified plaque at both vertebral artery origins does not result in significant stenosis. Skeleton: Advanced cervical disc degeneration. Left facet ankylosis at C2-3. Other neck: No  evidence of cervical lymphadenopathy or mass. Upper chest: Biapical pleuroparenchymal lung scarring. Mild chronic subpleural reticulation and ground-glass opacity in both upper lobes, less than on the 2019 CTA. Review of the MIP images confirms the above findings CTA HEAD FINDINGS Anterior circulation: The internal carotid arteries are patent from skull base to carotid termini with calcified plaque resulting in at most mild paraclinoid stenosis bilaterally. The apparent moderate proximal left petrous stenosis on the prior MRA was in part artifactual due to susceptibility from adjacent bone and middle ear gas with only at most mild narrowing evident on CTA. ACAs and MCAs are patent without evidence of a proximal branch occlusion or significant proximal stenosis. No aneurysm is identified. Posterior circulation: The intracranial internal carotid arteries are patent with atherosclerotic irregularity but no significant stenosis. Patent PICA and SCA origins are identified bilaterally. The basilar artery is widely patent. The PCAs are patent with some asymmetric distal branch vessel attenuation on the left but no significant  proximal stenosis. No aneurysm is identified. Venous sinuses: Patent. Anatomic variants: None. Review of the MIP images confirms the above findings IMPRESSION: 1. Mild atherosclerosis in the head and neck without a large vessel occlusion or high-grade proximal stenosis. 2. Large early subacute left PCA infarct. 3. Aortic Atherosclerosis (ICD10-I70.0). Electronically Signed   By: Logan Bores M.D.   On: 07/17/2021 20:45   CT ANGIO NECK W OR WO CONTRAST  Result Date: 07/17/2021 CLINICAL DATA:  Stroke/TIA, assess intracranial arteries. Visual disturbance. Left PCA infarct on MRI. EXAM: CT ANGIOGRAPHY HEAD AND NECK TECHNIQUE: Multidetector CT imaging of the head and neck was performed using the standard protocol during bolus administration of intravenous contrast. Multiplanar CT image reconstructions  and MIPs were obtained to evaluate the vascular anatomy. Carotid stenosis measurements (when applicable) are obtained utilizing NASCET criteria, using the distal internal carotid diameter as the denominator. CONTRAST:  36m OMNIPAQUE IOHEXOL 350 MG/ML SOLN COMPARISON:  Head MRI 07/17/2021. Head MRA 03/10/2021. Head and neck CTA 12/08/2017. FINDINGS: CT HEAD FINDINGS Brain: As seen on today's MRI, there is a large early subacute left PCA infarct with mild petechial hemorrhage. A small chronic left MCA territory infarct is again noted involving the frontal operculum and insula, and there are chronic bilateral cerebellar infarcts. Hypodensities elsewhere in the cerebral white matter bilaterally are nonspecific but compatible with mild chronic small vessel ischemic disease. There is mild-to-moderate cerebral atrophy. Vascular: Calcified atherosclerosis at the skull base. Skull: Left frontoparietal craniotomy. Right frontal and parietal burr holes. Sinuses: Visualized paranasal sinuses and mastoid air cells are clear. Orbits: Unremarkable. Review of the MIP images confirms the above findings CTA NECK FINDINGS Aortic arch: Standard 3 vessel aortic arch with moderate atherosclerotic plaque. No significant arch vessel origin stenosis. Right carotid system: Patent without evidence of stenosis or dissection. Left carotid system: Patent with a small amount of calcified and soft plaque at the carotid bifurcation. No evidence of a significant stenosis or dissection. Vertebral arteries: The vertebral arteries are patent with the left being mildly dominant. Calcified plaque at both vertebral artery origins does not result in significant stenosis. Skeleton: Advanced cervical disc degeneration. Left facet ankylosis at C2-3. Other neck: No evidence of cervical lymphadenopathy or mass. Upper chest: Biapical pleuroparenchymal lung scarring. Mild chronic subpleural reticulation and ground-glass opacity in both upper lobes, less than on  the 2019 CTA. Review of the MIP images confirms the above findings CTA HEAD FINDINGS Anterior circulation: The internal carotid arteries are patent from skull base to carotid termini with calcified plaque resulting in at most mild paraclinoid stenosis bilaterally. The apparent moderate proximal left petrous stenosis on the prior MRA was in part artifactual due to susceptibility from adjacent bone and middle ear gas with only at most mild narrowing evident on CTA. ACAs and MCAs are patent without evidence of a proximal branch occlusion or significant proximal stenosis. No aneurysm is identified. Posterior circulation: The intracranial internal carotid arteries are patent with atherosclerotic irregularity but no significant stenosis. Patent PICA and SCA origins are identified bilaterally. The basilar artery is widely patent. The PCAs are patent with some asymmetric distal branch vessel attenuation on the left but no significant proximal stenosis. No aneurysm is identified. Venous sinuses: Patent. Anatomic variants: None. Review of the MIP images confirms the above findings IMPRESSION: 1. Mild atherosclerosis in the head and neck without a large vessel occlusion or high-grade proximal stenosis. 2. Large early subacute left PCA infarct. 3. Aortic Atherosclerosis (ICD10-I70.0). Electronically Signed   By: ALogan Bores  M.D.   On: 07/17/2021 20:45   ECHOCARDIOGRAM COMPLETE  Result Date: 07/18/2021    ECHOCARDIOGRAM REPORT   Patient Name:   VERLEY LAUDON Date of Exam: 07/18/2021 Medical Rec #:  [id number]       Height:       70.0 in Accession #:    [id number]      Weight:       167.5 lb Date of Birth:  October 22, 1929        BSA:          1.936 m Patient Age:    34 years        BP:           119/80 mmHg Patient Gender: M               HR:           76 bpm. Exam Location:  Inpatient Procedure: 2D Echo, Cardiac Doppler and Color Doppler Indications:    CVA  History:        Patient has prior history of Echocardiogram examinations,  most                 recent 03/10/2021. [name].  Sonographer:    [place] Referring Phys: [id number] [place]  1. Left ventricular ejection fraction, by estimation, is 60 to 65%. The left ventricle has normal function. The left ventricle has no regional wall motion abnormalities. There is mild concentric left ventricular hypertrophy. Left ventricular diastolic function could not be evaluated.  2. Right ventricular systolic function is normal. The right ventricular size is mildly enlarged. There is mildly elevated pulmonary artery systolic pressure. The estimated right ventricular systolic pressure is [id number] mmHg.  3. Right atrial size was severely dilated.  4. Left atrial size was severely dilated.  5. The mitral valve is normal in structure. No evidence of mitral stenosis. There is mild Mitral Regurgitation. No mitral stenosis.  6. The aortic valve has an indeterminant number of cusps. There is moderate calcification of the aortic valve. There is moderate thickening of the aortic valve. Aortic valve regurgitation is not visualized. Planimetered AVA was 1.36cm2.     Aortic valve mean gradient measures 9.0 mmHg. Aortic valve Vmax measures 2.14 m/s. The mean AVG and Vmax are consistent with mild AS but DVi is low at 0.24 and SVI is low at 26 which is most consistent with low flow low gradient moderate AS in the setting of preserved LVF.  7. Aortic dilatation noted. There is moderate dilatation of the aortic root and of the ascending aorta, measuring 44 mm.  8. The inferior vena cava is normal in size with <50% respiratory variability, suggesting right atrial pressure of 8 mmHg.Side by side compareson of imaged from 03/10/2021 demonstrated no change in the mean AVG but the SVi has decreased and DVi has decreased from 0.37 to 0.24. Visually the AV appears slightly more stesnotic. LVF appears improved. FINDINGS  Left Ventricle: Left ventricular ejection fraction, by estimation, is 60 to 65%. The left ventricle has normal  function. The left ventricle has no regional wall motion abnormalities. The left ventricular internal cavity size was normal in size. There is  mild concentric left ventricular hypertrophy. Left ventricular diastolic function could not be evaluated. Right Ventricle: The right ventricular size is mildly enlarged. No increase in right ventricular wall thickness. Right ventricular systolic function is normal. There is mildly elevated pulmonary artery systolic pressure. The tricuspid regurgitant velocity is 2.73 m/s, and with an assumed right  atrial pressure of 8 mmHg, the estimated right ventricular systolic pressure is [id number] mmHg. Left Atrium: Left atrial size was severely dilated. Right Atrium: Right atrial size was severely dilated. Pericardium: There is no evidence of pericardial effusion. Mitral Valve: The mitral valve is abnormal. There is mild late systolic prolapse of the posterior leaflet of the mitral valve. Mild mitral annular calcification. Mild mitral valve regurgitation. No evidence of mitral valve stenosis. Tricuspid Valve: The tricuspid valve is normal in structure. Tricuspid valve regurgitation is mild . No evidence of tricuspid stenosis. Aortic Valve: Planimetered AVA was 1.36cm2. The aortic valve has an indeterminant number of cusps. There is moderate calcification of the aortic valve. There is moderate thickening of the aortic valve. Aortic valve regurgitation is not visualized. Moderate aortic stenosis is present. Aortic valve mean gradient measures 9.0 mmHg. Aortic valve peak gradient measures 18.3 mmHg. Aortic valve area, by VTI measures 0.93 cm. Pulmonic Valve: The pulmonic valve was normal in structure. Pulmonic valve regurgitation is mild. No evidence of pulmonic stenosis. Aorta: Aortic dilatation noted. There is moderate dilatation of the aortic root and of the ascending aorta, measuring 44 mm. Venous: The inferior vena cava is normal in size with less than 50% respiratory variability,  suggesting right atrial pressure of 8 mmHg. IAS/Shunts: No atrial level shunt detected by color flow Doppler.  LEFT VENTRICLE PLAX 2D LVIDd:         5.10 cm     Diastology LVIDs:         3.00 cm     LV e' medial:  7.07 cm/s LV PW:         1.20 cm     LV e' lateral: 11.50 cm/s LV IVS:        1.40 cm LVOT diam:     2.20 cm LV SV:         50 LV SV Index:   26 LVOT Area:     3.80 cm  LV Volumes (MOD) LV vol d, MOD A4C: 59.5 ml LV vol s, MOD A4C: 20.3 ml LV SV MOD A4C:     59.5 ml RIGHT VENTRICLE            IVC RV S prime:     8.92 cm/s  IVC diam: 1.50 cm TAPSE (M-mode): 2.3 cm LEFT ATRIUM              Index       RIGHT ATRIUM           Index LA diam:        5.40 cm  2.79 cm/m  RA Area:     42.50 cm LA Vol (A2C):   134.0 ml 69.21 ml/m RA Volume:   181.00 ml 93.49 ml/m LA Vol (A4C):   78.8 ml  40.70 ml/m LA Biplane Vol: 102.0 ml 52.69 ml/m  AORTIC VALVE                    PULMONIC VALVE AV Area (Vmax):    1.33 cm     PV Vmax:       0.80 m/s AV Area (Vmean):   1.30 cm     PV Peak grad:  2.6 mmHg AV Area (VTI):     0.93 cm AV Vmax:           214.00 cm/s AV Vmean:          136.000 cm/s AV VTI:  0.539 m AV Peak Grad:      18.3 mmHg AV Mean Grad:      9.0 mmHg LVOT Vmax:         75.10 cm/s LVOT Vmean:        46.400 cm/s LVOT VTI:          0.132 m LVOT/AV VTI ratio: 0.24  AORTA Ao Root diam: 3.50 cm Ao Asc diam:  4.40 cm MITRAL VALVE              TRICUSPID VALVE MV Area (PHT): 4.31 cm   TV Peak grad:   30.5 mmHg MR Peak grad: 59.0 mmHg   TV Vmax:        2.76 m/s MR Vmax:      384.00 cm/s TR Peak grad:   29.8 mmHg                           TR Vmax:        273.00 cm/s                            SHUNTS                           Systemic VTI:  0.13 m                           Systemic Diam: 2.20 cm Fransico Him MD Electronically signed by Fransico Him MD Signature Date/Time: 07/18/2021/11:17:33 AM    Final         Scheduled Meds:  ascorbic acid  500 mg Oral Daily   aspirin EC  81 mg Oral Daily    cholecalciferol  2,000 Units Oral QODAY   digoxin  0.125 mg Oral Daily   finasteride  5 mg Oral Daily   melatonin  3 mg Oral QHS   metoprolol succinate  25 mg Oral Daily   simvastatin  10 mg Oral q1800   Continuous Infusions:  cefTRIAXone (ROCEPHIN)  IV Stopped (07/19/21 0941)     LOS: 1 day    Time spent: 35 minutes.     Elmarie Shiley, MD [facility]   If 7PM-7AM, please contact night-coverage [email or web address]  07/19/2021, 5:43 PM

## 2021-07-19 NOTE — Plan of Care (Signed)
Pt is alert oriented  x 1. No distress noted.Call button within reach. Bed alarm on.   Problem: Education: Goal: Knowledge of General Education information will improve Description: Including pain rating scale, medication(s)/side effects and non-pharmacologic comfort measures Outcome: Progressing   Problem: Health Behavior/Discharge Planning: Goal: Ability to manage health-related needs will improve Outcome: Progressing   Problem: Clinical Measurements: Goal: Ability to maintain clinical measurements within normal limits will improve Outcome: Progressing Goal: Will remain free from infection Outcome: Progressing Goal: Diagnostic test results will improve Outcome: Progressing Goal: Respiratory complications will improve Outcome: Progressing Goal: Cardiovascular complication will be avoided Outcome: Progressing   Problem: Activity: Goal: Risk for activity intolerance will decrease Outcome: Progressing   Problem: Nutrition: Goal: Adequate nutrition will be maintained Outcome: Progressing   Problem: Coping: Goal: Level of anxiety will decrease Outcome: Progressing   Problem: Elimination: Goal: Will not experience complications related to bowel motility Outcome: Progressing Goal: Will not experience complications related to urinary retention Outcome: Progressing   Problem: Safety: Goal: Ability to remain free from injury will improve Outcome: Progressing   Problem: Skin Integrity: Goal: Risk for impaired skin integrity will decrease Outcome: Progressing   Problem: Education: Goal: Knowledge of disease or condition will improve Outcome: Progressing Goal: Knowledge of secondary prevention will improve Outcome: Progressing Goal: Knowledge of patient specific risk factors addressed and post discharge goals established will improve Outcome: Progressing Goal: Individualized Educational Video(s) Outcome: Progressing

## 2021-07-19 NOTE — Plan of Care (Signed)
  Problem: Education: Goal: Knowledge of General Education information will improve Description: Including pain rating scale, medication(s)/side effects and non-pharmacologic comfort measures Outcome: Progressing   Problem: Health Behavior/Discharge Planning: Goal: Ability to manage health-related needs will improve Outcome: Progressing   Problem: Clinical Measurements: Goal: Ability to maintain clinical measurements within normal limits will improve Outcome: Progressing Goal: Will remain free from infection Outcome: Progressing Goal: Diagnostic test results will improve Outcome: Progressing Goal: Respiratory complications will improve Outcome: Progressing Goal: Cardiovascular complication will be avoided Outcome: Progressing   Problem: Activity: Goal: Risk for activity intolerance will decrease Outcome: Progressing   Problem: Nutrition: Goal: Adequate nutrition will be maintained Outcome: Progressing   Problem: Coping: Goal: Level of anxiety will decrease Outcome: Progressing   Problem: Elimination: Goal: Will not experience complications related to bowel motility Outcome: Progressing Goal: Will not experience complications related to urinary retention Outcome: Progressing   Problem: Safety: Goal: Ability to remain free from injury will improve Outcome: Progressing   Problem: Skin Integrity: Goal: Risk for impaired skin integrity will decrease Outcome: Progressing   Problem: Education: Goal: Knowledge of disease or condition will improve Outcome: Progressing Goal: Knowledge of secondary prevention will improve Outcome: Progressing Goal: Knowledge of patient specific risk factors addressed and post discharge goals established will improve Outcome: Progressing Goal: Individualized Educational Video(s) Outcome: Progressing

## 2021-07-19 NOTE — Progress Notes (Signed)
STROKE TEAM PROGRESS NOTE   INTERVAL HISTORY Wife at the bedside. Pt sitting in chair for lunch.  Patient no acute event overnight, neuro stable.  Still has right hemianopia.  Consulted cardiology Dr. [name] for further discussion with patient regarding watchman device versus long-term anticoagulation.   Vitals:   07/19/21 0500 07/19/21 0720 07/19/21 1154 07/19/21 1506  BP:  131/68 137/65 117/70  Pulse:  63 (!) 47 (!) 53  Resp:  '18 18 20  '$ Temp:  97.6 F (36.4 C) 97.7 F (36.5 C)   TempSrc:  Oral Oral   SpO2:  97% 98% 99%  Weight: 79.7 kg     Height:       CBC:  Recent Labs  Lab 07/17/21 1331  WBC 7.6  NEUTROABS 4.5  HGB 12.9*  HCT 38.2*  MCV 92.9  PLT 123456*   Basic Metabolic Panel:  Recent Labs  Lab 07/17/21 1331 07/18/21 0235  NA 128* 130*  K 4.0 3.9  CL 93* 93*  CO2 24 27  GLUCOSE 117* 92  BUN 16 15  CREATININE 0.93 0.90  CALCIUM 9.3 9.4   Lipid Panel:  Recent Labs  Lab 07/18/21 0235  CHOL 100  TRIG 58  HDL 43  CHOLHDL 2.3  VLDL 12  LDLCALC 45   HgbA1c:  Recent Labs  Lab 07/18/21 0235  HGBA1C 5.7*   Urine Drug Screen: No results for input(s): LABOPIA, COCAINSCRNUR, LABBENZ, AMPHETMU, THCU, LABBARB in the last 168 hours.  Alcohol Level No results for input(s): ETH in the last 168 hours.  IMAGING past 24 hours No results found.  PHYSICAL EXAM  Temp:  [97.6 F (36.4 C)-98.9 F (37.2 C)] 97.7 F (36.5 C) (09/08 1154) Pulse Rate:  [47-65] 53 (09/08 1506) Resp:  [18-20] 20 (09/08 1506) BP: (114-137)/(59-92) 117/70 (09/08 1506) SpO2:  [97 %-100 %] 99 % (09/08 1506) Weight:  [79.7 kg] 79.7 kg (09/08 0500)  General - Well nourished, well developed, in no apparent distress.  Ophthalmologic - fundi not visualized due to noncooperation.  Cardiovascular - irregularly irregular heart rate and rhythm.  Mental Status -  Level of arousal and orientated to place and age and people but not to time. Language including naming, repetition,  comprehension was assessed and found intact.    Cranial Nerves II - XII - II - Right homonymous hemianopia III, IV, VI - Extraocular movements intact. V - Facial sensation intact bilaterally. VII - Facial movement intact bilaterally. VIII - Hearing & vestibular intact bilaterally. X - Palate elevates symmetrically. XI - Chin turning & shoulder shrug intact bilaterally. XII - Tongue protrusion intact.  Motor Strength - The patient's strength was normal in all extremities and pronator drift was absent.  Bulk was normal and fasciculations were absent.   Motor Tone - Muscle tone was assessed at the neck and appendages and was normal.  Reflexes - The patient's reflexes were symmetrical in all extremities and he had no pathological reflexes.  Sensory - Light touch, temperature/pinprick were assessed and were symmetrical.    Coordination - The patient had normal movements in the hands with no ataxia or dysmetria.  Tremor was absent.  Gait and Station - deferred.   ASSESSMENT/PLAN Ryan Savage is a 87 y.o. male with history of A. fib not on AC, [name], CHF, OSA on CPAP, hypertension, hyperlipidemia, history of stroke, history of SDH and ICH presenting with right hemianopia and word finding difficulty.   Stroke:  left large PCA infarct embolic likely secondary to chronic  A. fib not on AC CT head large early subacute left PCA infarct MRI Large acute/early subacute left PCA territory cortical/subcortical infarct, predominantly within the left temporal and occipital lobes. Mild petechial hemorrhage within portions of the infarction territory.  Chronic infarct left frontal, bilateral cerebellum. CTA head and neck no LVO, no left PCA occlusion. 2D Echo EF 60 to 65% LDL 45 HgbA1c 5.7 VTE prophylaxis -SCDs No antithrombotic prior to admission, now on aspirin 81 mg daily. Need to consider long-term DOAC versus watchman device plus short-term DOAC.  Dr. [name] will discuss with pt Therapy  recommendations: [facility] PT Disposition: Pending  History of stroke 02/2021 left MCA punctate infarct on MRI.  MRA left ICA cervical-petrous moderate stenosis.  Carotid Doppler negative.  EF 45 to 50%, LDL 54, A1c 5.6.  Etiology likely due to A. fib not on AC, however given patient history of ICH and SAH, discharged on aspirin 81 and Zocor 10. However, wife stated that patient is not on aspirin 81 at home.  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 fib Follows with [name] cardiology Not on [facility] due to previous SDH and ICH Rate controlled On digoxin and metoprolol Supposed to be on aspirin 81, however wife stated that patient not on aspirin at home. With current large left PCA infarct, need to consider long-term DOAC versus watchman device plus short-term DOAC.  Dr. [name] on board will discuss with pt and wfie  Hypertension Stable Long-term BP goal normotensive  Hyperlipidemia Home meds Zocor 10 LDL 45, at goal < 70 continue home Zocor '10mg'$  at discharge  Other Stroke Risk Factors Advanced Age >/= 40  Cigarette smoker in the past, quit 64 years ago Coronary artery disease Obstructive sleep apnea, on CPAP at home Congestive heart failure  Other Active Problems   Hospital day # 1   Rosalin Hawking, MD PhD Stroke Neurology 07/19/2021 7:30 PM    To contact Stroke Continuity provider, please refer to [email or web address] After hours, contact General Neurology

## 2021-07-19 NOTE — TOC Transition Note (Signed)
Transition of Care [facility]) - CM/SW Discharge Note   Patient Details  Name: Ryan Savage MRN: [id number] Date of Birth: [birth year]  [facility] of [facility]) CM/SW Contact:  Pollie Friar, RN Phone Number: 07/19/2021, 3:25 PM   Clinical Narrative:    Pt is from home with his spouse. Pt denies any issues with transportation or home medications. Pt has CPAP at home.  Walker for home ordered through [place] and will be delivered to the pts room. Home health arranged through [facility], after providing choice with [email or web address]. Information on the AVS. Wife to provide transport home.  Final next level of care: Home w Home Health Services Barriers to Discharge: No Barriers Identified   Patient Goals and CMS Choice   CMS Medicare.gov Compare Post Acute Care list provided to:: Patient Choice offered to / list presented to : Patient, Spouse  Discharge Placement                       Discharge Plan and Services                DME Arranged: Walker rolling DME Agency: [employer] Date DME Agency Contacted: 07/19/21   Representative spoke with at DME Agency: Freda Munro HH Arranged: PT, OT, Speech Therapy HH Agency: [place] Date [place]: 07/19/21   Representative spoke with at [place]: [place] ([place]) Interventions     Readmission Risk Interventions No flowsheet data found.

## 2021-07-19 NOTE — Discharge Summary (Signed)
Physician Discharge Summary  Ryan Savage [id number] DOB: Apr 12, 1929 DOA: 07/17/2021  PCP: Lajean Manes, MD  Admit date: 07/17/2021 Discharge date: 07/20/2021  Admitted From: Home  Disposition:  Home   Recommendations for Outpatient Follow-up:  Follow up with PCP in 1-2 weeks Please obtain BMP/CBC in one week Please follow up on the following pending results: Follow up urine culture.  Needs follow up with neurologist and cardiologist.   [place]: Yes.   Discharge Condition: Stable.  CODE STATUS: DNR Diet recommendation: Heart Healthy   Brief/Interim Summary: 87 -year-old with past medical history significant for A. fib not on anticoagulation due to falls, hypertension, hyperlipidemia, history of CVA 4/22, OSA, [name], SIADH, systolic heart failure, subdural hematoma x3 in 2019 who presented with vision changes,  confusion.  Right brain showed large acute early subacute left PCA territory cortical subcortical infarct predominantly within the left temporal and occipital lobes with mild petechial hemorrhage with thin portions of the infarct territory.     1-acute CVA: Patient presented with new right side visual field deficit, confusion and aphasia. MRI reveals large acute early subacute left PCA territory cortical subcortical infarct. Neurology consulted and following. Currently on a baby aspirin.  Follow neurology recommendation LDL; 45 A1c; 5.7  plan to discharge on aspirin.   2-permanent A. fib: Continue with beta-blocker and digoxin. Not on anticoagulation due to history of subdural and fall. He will follow up with DR cooper for further evaluation of watchman procedure.    UTI;  UA with more than 50 WBC.  Urine culture growing gram negative rods  Started on IV ceftriaxone.  Discharge on Keflex for 3 days.   Hypertension: Continue with Norvasc and metoprolol   Hyperlipidemia: Continue with the statins LDL:45   [name]: Continue with statin and aspirin   Chronic  systolic heart failure, compensated OSA: Continue with CPAP   SIADH, Hyponatremia; stable. Follow trend.           Discharge Diagnoses:  Principal Problem:   Acute CVA (cerebrovascular accident) [facility]) Active Problems:   Permanent atrial fibrillation (HCC)   Benign essential HTN   SIADH (syndrome of inappropriate ADH production) (HCC)   Hyperlipidemia   OSA (obstructive sleep apnea)   [name] (coronary artery disease)   Chronic systolic CHF (congestive heart failure), NYHA class 2 (Early)    Discharge Instructions  Discharge Instructions     Diet - low sodium heart healthy   Complete by: As directed    Increase activity slowly   Complete by: As directed       Allergies as of 07/20/2021       Reactions   Methylphenidate Derivatives Other (See Comments)   Restless leg   Warfarin Sodium    Other reaction(s): subdural   Novocain [procaine] Rash        Medication List     STOP taking these medications    Quercetin 250 MG Tabs       TAKE these medications    acetaminophen 325 MG tablet Commonly known as: TYLENOL Take 1-2 tablets (325-650 mg total) by mouth every 4 (four) hours as needed for mild pain.   amLODipine 2.5 MG tablet Commonly known as: NORVASC Take 1 tablet (2.5 mg total) by mouth every evening.   ascorbic acid 500 MG tablet Commonly known as: VITAMIN C Take 500 mg by mouth daily.   aspirin EC 81 MG tablet Take 1 tablet (81 mg total) by mouth daily. Swallow whole.   cephALEXin 500 MG capsule Commonly known  as: KEFLEX Take 1 capsule (500 mg total) by mouth every 6 (six) hours for 3 days.   digoxin 0.125 MG tablet Commonly known as: LANOXIN Take 1 tablet (0.125 mg total) by mouth daily.   finasteride 5 MG tablet Commonly known as: PROSCAR Take 1 tablet (5 mg total) by mouth daily.   melatonin 3 MG Tabs tablet Take 3 mg by mouth at bedtime.   metoprolol succinate 25 MG 24 hr tablet Commonly known as: TOPROL-XL Take 1 tablet (25 mg  total) by mouth daily.   multivitamin with minerals Tabs tablet Take 1 tablet by mouth daily.   simvastatin 10 MG tablet Commonly known as: ZOCOR Take 1 tablet (10 mg total) by mouth every evening.   Vitamin D3 50 MCG (2000 UT) Tabs Take 2,000 Units by mouth every other day.   zinc gluconate 50 MG tablet Take 50 mg by mouth daily.               Durable Medical Equipment  (From admission, onward)           Start     Ordered   07/19/21 1405  For home use only DME Walker rolling  Once       Question Answer Comment  Walker: With [place]   Patient needs a walker to treat with the following condition Stroke [facility])      07/19/21 1405            Follow-up Information     [facility] [place] Follow up.   Specialty: Home Health Services Why: THe home health agency will contact you for the first home visit. Contact information: [address] [place] [address] [phone]                Allergies  Allergen Reactions   Methylphenidate Derivatives Other (See Comments)    Restless leg   Warfarin Sodium     Other reaction(s): subdural   Novocain [Procaine] Rash    Consultations: Neurology Cardiology   Procedures/Studies: CT ANGIO HEAD W OR WO CONTRAST  Result Date: 07/17/2021 CLINICAL DATA:  Stroke/TIA, assess intracranial arteries. Visual disturbance. Left PCA infarct on MRI. EXAM: CT ANGIOGRAPHY HEAD AND NECK TECHNIQUE: Multidetector CT imaging of the head and neck was performed using the standard protocol during bolus administration of intravenous contrast. Multiplanar CT image reconstructions and MIPs were obtained to evaluate the vascular anatomy. Carotid stenosis measurements (when applicable) are obtained utilizing NASCET criteria, using the distal internal carotid diameter as the denominator. CONTRAST:  20m OMNIPAQUE IOHEXOL 350 MG/ML SOLN COMPARISON:  Head MRI 07/17/2021. Head MRA 03/10/2021. Head and neck CTA  12/08/2017. FINDINGS: CT HEAD FINDINGS Brain: As seen on today's MRI, there is a large early subacute left PCA infarct with mild petechial hemorrhage. A small chronic left MCA territory infarct is again noted involving the frontal operculum and insula, and there are chronic bilateral cerebellar infarcts. Hypodensities elsewhere in the cerebral white matter bilaterally are nonspecific but compatible with mild chronic small vessel ischemic disease. There is mild-to-moderate cerebral atrophy. Vascular: Calcified atherosclerosis at the skull base. Skull: Left frontoparietal craniotomy. Right frontal and parietal burr holes. Sinuses: Visualized paranasal sinuses and mastoid air cells are clear. Orbits: Unremarkable. Review of the MIP images confirms the above findings CTA NECK FINDINGS Aortic arch: Standard 3 vessel aortic arch with moderate atherosclerotic plaque. No significant arch vessel origin stenosis. Right carotid system: Patent without evidence of stenosis or dissection. Left carotid system: Patent with  a small amount of calcified and soft plaque at the carotid bifurcation. No evidence of a significant stenosis or dissection. Vertebral arteries: The vertebral arteries are patent with the left being mildly dominant. Calcified plaque at both vertebral artery origins does not result in significant stenosis. Skeleton: Advanced cervical disc degeneration. Left facet ankylosis at C2-3. Other neck: No evidence of cervical lymphadenopathy or mass. Upper chest: Biapical pleuroparenchymal lung scarring. Mild chronic subpleural reticulation and ground-glass opacity in both upper lobes, less than on the 2019 CTA. Review of the MIP images confirms the above findings CTA HEAD FINDINGS Anterior circulation: The internal carotid arteries are patent from skull base to carotid termini with calcified plaque resulting in at most mild paraclinoid stenosis bilaterally. The apparent moderate proximal left petrous stenosis on the prior  MRA was in part artifactual due to susceptibility from adjacent bone and middle ear gas with only at most mild narrowing evident on CTA. ACAs and MCAs are patent without evidence of a proximal branch occlusion or significant proximal stenosis. No aneurysm is identified. Posterior circulation: The intracranial internal carotid arteries are patent with atherosclerotic irregularity but no significant stenosis. Patent PICA and SCA origins are identified bilaterally. The basilar artery is widely patent. The PCAs are patent with some asymmetric distal branch vessel attenuation on the left but no significant proximal stenosis. No aneurysm is identified. Venous sinuses: Patent. Anatomic variants: None. Review of the MIP images confirms the above findings IMPRESSION: 1. Mild atherosclerosis in the head and neck without a large vessel occlusion or high-grade proximal stenosis. 2. Large early subacute left PCA infarct. 3. Aortic Atherosclerosis (ICD10-I70.0). Electronically Signed   By: Logan Bores M.D.   On: 07/17/2021 20:45   CT ANGIO NECK W OR WO CONTRAST  Result Date: 07/17/2021 CLINICAL DATA:  Stroke/TIA, assess intracranial arteries. Visual disturbance. Left PCA infarct on MRI. EXAM: CT ANGIOGRAPHY HEAD AND NECK TECHNIQUE: Multidetector CT imaging of the head and neck was performed using the standard protocol during bolus administration of intravenous contrast. Multiplanar CT image reconstructions and MIPs were obtained to evaluate the vascular anatomy. Carotid stenosis measurements (when applicable) are obtained utilizing NASCET criteria, using the distal internal carotid diameter as the denominator. CONTRAST:  43m OMNIPAQUE IOHEXOL 350 MG/ML SOLN COMPARISON:  Head MRI 07/17/2021. Head MRA 03/10/2021. Head and neck CTA 12/08/2017. FINDINGS: CT HEAD FINDINGS Brain: As seen on today's MRI, there is a large early subacute left PCA infarct with mild petechial hemorrhage. A small chronic left MCA territory infarct is  again noted involving the frontal operculum and insula, and there are chronic bilateral cerebellar infarcts. Hypodensities elsewhere in the cerebral white matter bilaterally are nonspecific but compatible with mild chronic small vessel ischemic disease. There is mild-to-moderate cerebral atrophy. Vascular: Calcified atherosclerosis at the skull base. Skull: Left frontoparietal craniotomy. Right frontal and parietal burr holes. Sinuses: Visualized paranasal sinuses and mastoid air cells are clear. Orbits: Unremarkable. Review of the MIP images confirms the above findings CTA NECK FINDINGS Aortic arch: Standard 3 vessel aortic arch with moderate atherosclerotic plaque. No significant arch vessel origin stenosis. Right carotid system: Patent without evidence of stenosis or dissection. Left carotid system: Patent with a small amount of calcified and soft plaque at the carotid bifurcation. No evidence of a significant stenosis or dissection. Vertebral arteries: The vertebral arteries are patent with the left being mildly dominant. Calcified plaque at both vertebral artery origins does not result in significant stenosis. Skeleton: Advanced cervical disc degeneration. Left facet ankylosis at C2-3. Other neck:  No evidence of cervical lymphadenopathy or mass. Upper chest: Biapical pleuroparenchymal lung scarring. Mild chronic subpleural reticulation and ground-glass opacity in both upper lobes, less than on the 2019 CTA. Review of the MIP images confirms the above findings CTA HEAD FINDINGS Anterior circulation: The internal carotid arteries are patent from skull base to carotid termini with calcified plaque resulting in at most mild paraclinoid stenosis bilaterally. The apparent moderate proximal left petrous stenosis on the prior MRA was in part artifactual due to susceptibility from adjacent bone and middle ear gas with only at most mild narrowing evident on CTA. ACAs and MCAs are patent without evidence of a proximal  branch occlusion or significant proximal stenosis. No aneurysm is identified. Posterior circulation: The intracranial internal carotid arteries are patent with atherosclerotic irregularity but no significant stenosis. Patent PICA and SCA origins are identified bilaterally. The basilar artery is widely patent. The PCAs are patent with some asymmetric distal branch vessel attenuation on the left but no significant proximal stenosis. No aneurysm is identified. Venous sinuses: Patent. Anatomic variants: None. Review of the MIP images confirms the above findings IMPRESSION: 1. Mild atherosclerosis in the head and neck without a large vessel occlusion or high-grade proximal stenosis. 2. Large early subacute left PCA infarct. 3. Aortic Atherosclerosis (ICD10-I70.0). Electronically Signed   By: Logan Bores M.D.   On: 07/17/2021 20:45   MR BRAIN WO CONTRAST  Result Date: 07/17/2021 CLINICAL DATA:  Neuro deficit, acute, stroke suspected. Additional history provided: Right eye vision abnormality which began on Saturday. EXAM: MRI HEAD WITHOUT CONTRAST TECHNIQUE: Multiplanar, multiecho pulse sequences of the brain and surrounding structures were obtained without intravenous contrast. COMPARISON:  MRI brain 03/09/2021, MRA head 03/10/2021. Head CT 03/09/2021. FINDINGS: Brain: Intermittently motion degraded examination. Most notably, there is moderate motion degradation of the sagittal T1 weighted sequence, moderate motion degradation of the coronal T2 TSE sequence. Moderate generalized cerebral atrophy. Large acute cortical/subcortical infarct within the left PCA vascular territory, predominantly within the left temporal and occipital lobes. Mild petechial hemorrhage within portions of the infarction territory. Redemonstrated chronic cortically based infarcts within the left frontal lobe/insula. Mild multifocal T2/FLAIR hyperintensity within the white matter, nonspecific but compatible with chronic small vessel ischemic  disease. Small chronic infarcts within the bilateral cerebellar hemispheres and chronic hemorrhage within the right cerebellar hemisphere, unchanged. No evidence of an intracranial mass. No extra-axial fluid collection. No midline shift. Vascular: No definite loss of proximal arterial flow voids appreciated. Skull and upper cervical spine: No focal suspicious marrow lesion. Left-sided cranioplasty. Bilateral frontoparietal burr holes. Sinuses/Orbits: Visualized orbits show no acute finding. No significant paranasal sinus disease at the imaged levels. IMPRESSION: Motion degraded exam. Large acute/early subacute left PCA territory cortical/subcortical infarct, predominantly within the left temporal and occipital lobes. Mild petechial hemorrhage within portions of the infarction territory. Redemonstrated chronic cortically based infarcts within the left frontal lobe/insula. Background mild chronic small vessel ischemic changes within the cerebral white matter. Redemonstrated small chronic infarcts within the bilateral cerebellar hemispheres, and chronic hemorrhage within the right cerebellar hemisphere, unchanged. Moderate generalized cerebral atrophy. Electronically Signed   By: Kellie Simmering D.O.   On: 07/17/2021 15:16   ECHOCARDIOGRAM COMPLETE  Result Date: 07/18/2021    ECHOCARDIOGRAM REPORT   Patient Name:   Ryan Savage Date of Exam: 07/18/2021 Medical Rec #:  [id number]       Height:       70.0 in Accession #:    [id number]      Weight:  167.5 lb Date of Birth:  [birth year]        BSA:          1.936 m Patient Age:    25 years        BP:           119/80 mmHg Patient Gender: M               HR:           76 bpm. Exam Location:  Inpatient Procedure: 2D Echo, Cardiac Doppler and Color Doppler Indications:    CVA  History:        Patient has prior history of Echocardiogram examinations, most                 recent 03/10/2021. [name].  Sonographer:    [place] Referring Phys: [id number] [place]  1. Left  ventricular ejection fraction, by estimation, is 60 to 65%. The left ventricle has normal function. The left ventricle has no regional wall motion abnormalities. There is mild concentric left ventricular hypertrophy. Left ventricular diastolic function could not be evaluated.  2. Right ventricular systolic function is normal. The right ventricular size is mildly enlarged. There is mildly elevated pulmonary artery systolic pressure. The estimated right ventricular systolic pressure is [id number] mmHg.  3. Right atrial size was severely dilated.  4. Left atrial size was severely dilated.  5. The mitral valve is normal in structure. No evidence of mitral stenosis. There is mild Mitral Regurgitation. No mitral stenosis.  6. The aortic valve has an indeterminant number of cusps. There is moderate calcification of the aortic valve. There is moderate thickening of the aortic valve. Aortic valve regurgitation is not visualized. Planimetered AVA was 1.36cm2.     Aortic valve mean gradient measures 9.0 mmHg. Aortic valve Vmax measures 2.14 m/s. The mean AVG and Vmax are consistent with mild AS but DVi is low at 0.24 and SVI is low at 26 which is most consistent with low flow low gradient moderate AS in the setting of preserved LVF.  7. Aortic dilatation noted. There is moderate dilatation of the aortic root and of the ascending aorta, measuring 44 mm.  8. The inferior vena cava is normal in size with <50% respiratory variability, suggesting right atrial pressure of 8 mmHg.Side by side compareson of imaged from 03/10/2021 demonstrated no change in the mean AVG but the SVi has decreased and DVi has decreased from 0.37 to 0.24. Visually the AV appears slightly more stesnotic. LVF appears improved. FINDINGS  Left Ventricle: Left ventricular ejection fraction, by estimation, is 60 to 65%. The left ventricle has normal function. The left ventricle has no regional wall motion abnormalities. The left ventricular internal cavity size was  normal in size. There is  mild concentric left ventricular hypertrophy. Left ventricular diastolic function could not be evaluated. Right Ventricle: The right ventricular size is mildly enlarged. No increase in right ventricular wall thickness. Right ventricular systolic function is normal. There is mildly elevated pulmonary artery systolic pressure. The tricuspid regurgitant velocity is 2.73 m/s, and with an assumed right atrial pressure of 8 mmHg, the estimated right ventricular systolic pressure is [id number] mmHg. Left Atrium: Left atrial size was severely dilated. Right Atrium: Right atrial size was severely dilated. Pericardium: There is no evidence of pericardial effusion. Mitral Valve: The mitral valve is abnormal. There is mild late systolic prolapse of the posterior leaflet of the mitral valve. Mild mitral annular calcification. Mild mitral valve regurgitation. No evidence  of mitral valve stenosis. Tricuspid Valve: The tricuspid valve is normal in structure. Tricuspid valve regurgitation is mild . No evidence of tricuspid stenosis. Aortic Valve: Planimetered AVA was 1.36cm2. The aortic valve has an indeterminant number of cusps. There is moderate calcification of the aortic valve. There is moderate thickening of the aortic valve. Aortic valve regurgitation is not visualized. Moderate aortic stenosis is present. Aortic valve mean gradient measures 9.0 mmHg. Aortic valve peak gradient measures 18.3 mmHg. Aortic valve area, by VTI measures 0.93 cm. Pulmonic Valve: The pulmonic valve was normal in structure. Pulmonic valve regurgitation is mild. No evidence of pulmonic stenosis. Aorta: Aortic dilatation noted. There is moderate dilatation of the aortic root and of the ascending aorta, measuring 44 mm. Venous: The inferior vena cava is normal in size with less than 50% respiratory variability, suggesting right atrial pressure of 8 mmHg. IAS/Shunts: No atrial level shunt detected by color flow Doppler.  LEFT  VENTRICLE PLAX 2D LVIDd:         5.10 cm     Diastology LVIDs:         3.00 cm     LV e' medial:  7.07 cm/s LV PW:         1.20 cm     LV e' lateral: 11.50 cm/s LV IVS:        1.40 cm LVOT diam:     2.20 cm LV SV:         50 LV SV Index:   26 LVOT Area:     3.80 cm  LV Volumes (MOD) LV vol d, MOD A4C: 59.5 ml LV vol s, MOD A4C: 20.3 ml LV SV MOD A4C:     59.5 ml RIGHT VENTRICLE            IVC RV S prime:     8.92 cm/s  IVC diam: 1.50 cm TAPSE (M-mode): 2.3 cm LEFT ATRIUM              Index       RIGHT ATRIUM           Index LA diam:        5.40 cm  2.79 cm/m  RA Area:     42.50 cm LA Vol (A2C):   134.0 ml 69.21 ml/m RA Volume:   181.00 ml 93.49 ml/m LA Vol (A4C):   78.8 ml  40.70 ml/m LA Biplane Vol: 102.0 ml 52.69 ml/m  AORTIC VALVE                    PULMONIC VALVE AV Area (Vmax):    1.33 cm     PV Vmax:       0.80 m/s AV Area (Vmean):   1.30 cm     PV Peak grad:  2.6 mmHg AV Area (VTI):     0.93 cm AV Vmax:           214.00 cm/s AV Vmean:          136.000 cm/s AV VTI:            0.539 m AV Peak Grad:      18.3 mmHg AV Mean Grad:      9.0 mmHg LVOT Vmax:         75.10 cm/s LVOT Vmean:        46.400 cm/s LVOT VTI:          0.132 m LVOT/AV VTI ratio: 0.24  AORTA Ao Root diam: 3.50 cm  Ao Asc diam:  4.40 cm MITRAL VALVE              TRICUSPID VALVE MV Area (PHT): 4.31 cm   TV Peak grad:   30.5 mmHg MR Peak grad: 59.0 mmHg   TV Vmax:        2.76 m/s MR Vmax:      384.00 cm/s TR Peak grad:   29.8 mmHg                           TR Vmax:        273.00 cm/s                            SHUNTS                           Systemic VTI:  0.13 m                           Systemic Diam: 2.20 cm Fransico Him MD Electronically signed by Fransico Him MD Signature Date/Time: 07/18/2021/11:17:33 AM    Final      Subjective: He is feeling well.   Discharge Exam: Vitals:   07/20/21 0738 07/20/21 1156  BP: 137/73 131/81  Pulse: 61 60  Resp: 20 18  Temp: 98.1 F (36.7 C) 98.3 F (36.8 C)  SpO2: 97% 100%      General: Pt is alert, awake, not in acute distress Cardiovascular: RRR, S1/S2 +, no rubs, no gallops Respiratory: CTA bilaterally, no wheezing, no rhonchi Abdominal: Soft, NT, ND, bowel sounds + Extremities: no edema, no cyanosis    The results of significant diagnostics from this hospitalization (including imaging, microbiology, ancillary and laboratory) are listed below for reference.     Microbiology: Recent Results (from the past 240 hour(s))  SARS CORONAVIRUS 2 (TAT 6-24 HRS) Nasopharyngeal Nasopharyngeal Swab     Status: None   Collection Time: 07/17/21 11:42 PM   Specimen: Nasopharyngeal Swab  Result Value Ref Range Status   SARS Coronavirus 2 NEGATIVE NEGATIVE Final    Comment: (NOTE) SARS-CoV-2 target nucleic acids are NOT DETECTED.  The SARS-CoV-2 RNA is generally detectable in upper and lower respiratory specimens during the acute phase of infection. Negative results do not preclude SARS-CoV-2 infection, do not rule out co-infections with other pathogens, and should not be used as the sole basis for treatment or other patient management decisions. Negative results must be combined with clinical observations, patient history, and epidemiological information. The expected result is Negative.  Fact Sheet for Patients: SugarRoll.be  Fact Sheet for Healthcare Providers: https://www.woods-mathews.com/  This test is not yet approved or cleared by the [place] FDA and  has been authorized for detection and/or diagnosis of SARS-CoV-2 by FDA under an Emergency Use Authorization (EUA). This EUA will remain  in effect (meaning this test can be used) for the duration of the COVID-19 declaration under Se ction 564(b)(1) of the Act, 21 U.S.C. section 360bbb-3(b)(1), unless the authorization is terminated or revoked sooner.  Performed at [facility], [place] [address], [place], [place] [address]   Urine Culture      Status: Abnormal (Preliminary result)   Collection Time: 07/19/21 10:03 AM   Specimen: Urine, Clean Catch  Result Value Ref Range Status   Specimen Description URINE, CLEAN CATCH  Final  Special Requests NONE  Final   Culture (A)  Final    >=100,000 COLONIES/mL ESCHERICHIA COLI SUSCEPTIBILITIES TO FOLLOW Performed at [facility], [place] [address], [place], [place] [address]    Report Status PENDING  Incomplete  MRSA Next Gen by PCR, Nasal     Status: None   Collection Time: 07/19/21 10:05 AM   Specimen: Nasal Mucosa; Nasal Swab  Result Value Ref Range Status   MRSA by PCR Next Gen NOT DETECTED NOT DETECTED Final    Comment: (NOTE) The GeneXpert MRSA Assay (FDA approved for NASAL specimens only), is one component of a comprehensive MRSA colonization surveillance program. It is not intended to diagnose MRSA infection nor to guide or monitor treatment for MRSA infections. Test performance is not FDA approved in patients less than 91 years old. Performed at [facility], [place] [address] [place], [place] [address]      Labs: BNP (last 3 results) No results for input(s): BNP in the last 8760 hours. Basic Metabolic Panel: Recent Labs  Lab 07/17/21 1331 07/18/21 0235  NA 128* 130*  K 4.0 3.9  CL 93* 93*  CO2 24 27  GLUCOSE 117* 92  BUN 16 15  CREATININE 0.93 0.90  CALCIUM 9.3 9.4   Liver Function Tests: Recent Labs  Lab 07/17/21 1331  AST 27  ALT 16  ALKPHOS 44  BILITOT 1.5*  PROT 7.8  ALBUMIN 3.8   No results for input(s): LIPASE, AMYLASE in the last 168 hours. No results for input(s): AMMONIA in the last 168 hours. CBC: Recent Labs  Lab 07/17/21 1331  WBC 7.6  NEUTROABS 4.5  HGB 12.9*  HCT 38.2*  MCV 92.9  PLT 146*   Cardiac Enzymes: No results for input(s): CKTOTAL, CKMB, CKMBINDEX, TROPONINI in the last 168 hours. BNP: Invalid input(s): POCBNP CBG: No results for input(s): GLUCAP in the last 168 hours. D-Dimer No results for  input(s): DDIMER in the last 72 hours. Hgb A1c Recent Labs    07/18/21 0235  HGBA1C 5.7*   Lipid Profile Recent Labs    07/18/21 0235  CHOL 100  HDL 43  LDLCALC 45  TRIG 58  CHOLHDL 2.3   Thyroid function studies No results for input(s): TSH, T4TOTAL, T3FREE, THYROIDAB in the last 72 hours.  Invalid input(s): FREET3 Anemia work up No results for input(s): VITAMINB12, FOLATE, FERRITIN, TIBC, IRON, RETICCTPCT in the last 72 hours. Urinalysis    Component Value Date/Time   COLORURINE YELLOW 07/19/2021 0348   APPEARANCEUR CLOUDY (A) 07/19/2021 0348   LABSPEC 1.013 07/19/2021 0348   PHURINE 6.0 07/19/2021 0348   GLUCOSEU NEGATIVE 07/19/2021 0348   HGBUR MODERATE (A) 07/19/2021 0348   BILIRUBINUR NEGATIVE 07/19/2021 0348   KETONESUR NEGATIVE 07/19/2021 0348   PROTEINUR NEGATIVE 07/19/2021 0348   NITRITE POSITIVE (A) 07/19/2021 0348   LEUKOCYTESUR LARGE (A) 07/19/2021 0348   Sepsis Labs Invalid input(s): PROCALCITONIN,  WBC,  LACTICIDVEN Microbiology Recent Results (from the past 240 hour(s))  SARS CORONAVIRUS 2 (TAT 6-24 HRS) Nasopharyngeal Nasopharyngeal Swab     Status: None   Collection Time: 07/17/21 11:42 PM   Specimen: Nasopharyngeal Swab  Result Value Ref Range Status   SARS Coronavirus 2 NEGATIVE NEGATIVE Final    Comment: (NOTE) SARS-CoV-2 target nucleic acids are NOT DETECTED.  The SARS-CoV-2 RNA is generally detectable in upper and lower respiratory specimens during the acute phase of infection. Negative results do not preclude SARS-CoV-2 infection, do not rule out co-infections with other  pathogens, and should not be used as the sole basis for treatment or other patient management decisions. Negative results must be combined with clinical observations, patient history, and epidemiological information. The expected result is Negative.  Fact Sheet for Patients: SugarRoll.be  Fact Sheet for Healthcare  Providers: https://www.woods-mathews.com/  This test is not yet approved or cleared by the [place] FDA and  has been authorized for detection and/or diagnosis of SARS-CoV-2 by FDA under an Emergency Use Authorization (EUA). This EUA will remain  in effect (meaning this test can be used) for the duration of the COVID-19 declaration under Se ction 564(b)(1) of the Act, 21 U.S.C. section 360bbb-3(b)(1), unless the authorization is terminated or revoked sooner.  Performed at [facility], [place] [address], [place], [place] [address]   Urine Culture     Status: Abnormal (Preliminary result)   Collection Time: 07/19/21 10:03 AM   Specimen: Urine, Clean Catch  Result Value Ref Range Status   Specimen Description URINE, CLEAN CATCH  Final   Special Requests NONE  Final   Culture (A)  Final    >=100,000 COLONIES/mL ESCHERICHIA COLI SUSCEPTIBILITIES TO FOLLOW Performed at [facility], [place] [address] [place], [place] [address]    Report Status PENDING  Incomplete  MRSA Next Gen by PCR, Nasal     Status: None   Collection Time: 07/19/21 10:05 AM   Specimen: Nasal Mucosa; Nasal Swab  Result Value Ref Range Status   MRSA by PCR Next Gen NOT DETECTED NOT DETECTED Final    Comment: (NOTE) The GeneXpert MRSA Assay (FDA approved for NASAL specimens only), is one component of a comprehensive MRSA colonization surveillance program. It is not intended to diagnose MRSA infection nor to guide or monitor treatment for MRSA infections. Test performance is not FDA approved in patients less than 60 years old. Performed at [facility], [place] [address] [place], [place] [address]      Time coordinating discharge: 40 minutes  SIGNED:   Elmarie Shiley, MD  [facility]

## 2021-07-20 ENCOUNTER — Other Ambulatory Visit: Payer: Self-pay

## 2021-07-20 DIAGNOSIS — I4821 Permanent atrial fibrillation: Secondary | ICD-10-CM

## 2021-07-20 DIAGNOSIS — I639 Cerebral infarction, unspecified: Secondary | ICD-10-CM

## 2021-07-20 MED ORDER — CEPHALEXIN 500 MG PO CAPS: 500.0000 mg | ORAL_CAPSULE | Freq: Four times a day (QID) | ORAL | 0 refills | Status: AC

## 2021-07-20 MED ORDER — CEPHALEXIN 500 MG PO CAPS
500.0000 mg | ORAL_CAPSULE | Freq: Four times a day (QID) | ORAL | Status: DC
  Administered 2021-07-20: 500 mg via ORAL
  Filled 2021-07-20: qty 1

## 2021-07-20 NOTE — Progress Notes (Signed)
Physical Therapy Treatment Patient Details Name: Ryan Savage MRN: [id number] DOB: 1929-10-03 Today's Date: 07/20/2021    History of Present Illness Ryan Savage is a 87 y.o. male presenting with new onset of right sided visual field deficit, confusion, and word-finding difficulties; MRI brain reveals a large left temporal and occipital lobe ischemic infarction; chronic B cerebellar infarcts. Past medical history significant of atrial fibrillation not on anticoagulation due to falls, HTN, HLD, hx of CVA in 4/22, OSA, [name], SIADH, systolic CHF, SH x 3 in [id number]    PT Comments    Patient received standing in room with RN present. Agrees to PT session. He ambulated 300 feet with RW and min guard. Ambulated up/down 5 steps with B rails and min guard. No overt lob but benefits from supervision for safety.  He will continue to benefit from skilled PT while here to improve safety with mobility and strength.    Follow Up Recommendations  Home health PT;Supervision - Intermittent;Supervision/Assistance - 24 hour     Equipment Recommendations  Rolling walker with 5" wheels    Recommendations for Other Services       Precautions / Restrictions Precautions Precautions: Fall Precaution Comments: R sided visual deficits Restrictions Weight Bearing Restrictions: No    Mobility  Bed Mobility               General bed mobility comments: not observed, patient sitting up in bed on arrival    Transfers Overall transfer level: Needs assistance Equipment used: Rolling walker (2 wheeled) Transfers: Sit to/from Stand Sit to Stand: Min guard;Supervision            Ambulation/Gait Ambulation/Gait assistance: Min guard Gait Distance (Feet): 300 Feet Assistive device: Rolling walker (2 wheeled) Gait Pattern/deviations: Step-through pattern Gait velocity: decr   General Gait Details: no overt lob but benefits from supervision when ambulating. Cues for direction.   Stairs Stairs:  Yes Stairs assistance: Min guard Stair Management: Two rails;Alternating pattern Number of Stairs: 5 General stair comments: generally safe when holding to rails.   Wheelchair Mobility    Modified Rankin (Stroke Patients Only) Modified Rankin (Stroke Patients Only) Pre-Morbid Rankin Score: No symptoms Modified Rankin: Moderate disability     Balance Overall balance assessment: Needs assistance Sitting-balance support: Feet supported Sitting balance-Leahy Scale: Normal     Standing balance support: Bilateral upper extremity supported;During functional activity Standing balance-Leahy Scale: Good Standing balance comment: reliant on B UE support for safety with gait. Static standing with single UE support.                            Cognition Arousal/Alertness: Awake/alert Behavior During Therapy: WFL for tasks assessed/performed Overall Cognitive Status: Impaired/Different from baseline Area of Impairment: Awareness                   Current Attention Level: Sustained Memory: Decreased short-term memory Following Commands: Follows one step commands with increased time Safety/Judgement: Decreased awareness of safety Awareness: Emergent Problem Solving: Slow processing;Difficulty sequencing        Exercises      General Comments        Pertinent Vitals/Pain Pain Assessment: No/denies pain    Home Living                      Prior Function            PT Goals (current goals can now be found in the care  plan section) Acute Rehab PT Goals Patient Stated Goal: get stronger PT Goal Formulation: With patient Time For Goal Achievement: 08/01/21 Potential to Achieve Goals: Good Progress towards PT goals: Progressing toward goals    Frequency    Min 4X/week      PT Plan Current plan remains appropriate    Co-evaluation              AM-PAC PT "6 Clicks" Mobility   Outcome Measure  Help needed turning from your back  to your side while in a flat bed without using bedrails?: A Little Help needed moving from lying on your back to sitting on the side of a flat bed without using bedrails?: A Little Help needed moving to and from a bed to a chair (including a wheelchair)?: A Little Help needed standing up from a chair using your arms (e.g., wheelchair or bedside chair)?: A Little Help needed to walk in hospital room?: A Little Help needed climbing 3-5 steps with a railing? : A Little 6 Click Score: 18    End of Session Equipment Utilized During Treatment: Gait belt Activity Tolerance: Patient tolerated treatment well Patient left: in bed;with call bell/phone within reach;Other (comment) (with NP present in room) Nurse Communication: Mobility status PT Visit Diagnosis: Other abnormalities of gait and mobility (R26.89);Muscle weakness (generalized) (M62.81)     Time: [id number] PT Time Calculation (min) (ACUTE ONLY): 23 min  Charges:  $Gait Training: 23-37 mins                     Tylyn Stankovich, PT, GCS 07/20/21,10:40 AM

## 2021-07-20 NOTE — Progress Notes (Signed)
STROKE TEAM PROGRESS NOTE   INTERVAL HISTORY Wife at the bedside. Pt sitting in chair, dressed up, eager to go home. Dr. [name] saw pt today and plan for Watchman in the near future. Will d/c with ASA 81 at this time.    Vitals:   07/19/21 2335 07/20/21 0354 07/20/21 0738 07/20/21 1156  BP: (!) 103/57  137/73 131/81  Pulse: (!) 43  61 60  Resp: '17  20 18  '$ Temp: 98.9 F (37.2 C)  98.1 F (36.7 C) 98.3 F (36.8 C)  TempSrc: Oral  Oral Oral  SpO2: 94%  97% 100%  Weight:  79.4 kg    Height:       CBC:  Recent Labs  Lab 07/17/21 1331  WBC 7.6  NEUTROABS 4.5  HGB 12.9*  HCT 38.2*  MCV 92.9  PLT 123456*   Basic Metabolic Panel:  Recent Labs  Lab 07/17/21 1331 07/18/21 0235  NA 128* 130*  K 4.0 3.9  CL 93* 93*  CO2 24 27  GLUCOSE 117* 92  BUN 16 15  CREATININE 0.93 0.90  CALCIUM 9.3 9.4   Lipid Panel:  Recent Labs  Lab 07/18/21 0235  CHOL 100  TRIG 58  HDL 43  CHOLHDL 2.3  VLDL 12  LDLCALC 45   HgbA1c:  Recent Labs  Lab 07/18/21 0235  HGBA1C 5.7*   Urine Drug Screen: No results for input(s): LABOPIA, COCAINSCRNUR, LABBENZ, AMPHETMU, THCU, LABBARB in the last 168 hours.  Alcohol Level No results for input(s): ETH in the last 168 hours.  IMAGING past 24 hours No results found.  PHYSICAL EXAM  Temp:  [98 F (36.7 C)-98.9 F (37.2 C)] 98.3 F (36.8 C) (09/09 1156) Pulse Rate:  [43-61] 60 (09/09 1156) Resp:  [16-20] 18 (09/09 1156) BP: (103-137)/(57-104) 131/81 (09/09 1156) SpO2:  [94 %-100 %] 100 % (09/09 1156) Weight:  [79.4 kg] 79.4 kg (09/09 0354)  General - Well nourished, well developed, in no apparent distress.  Ophthalmologic - fundi not visualized due to noncooperation.  Cardiovascular - irregularly irregular heart rate and rhythm.  Mental Status -  Level of arousal and orientated to place and age and people but not to time. Language including naming, repetition, comprehension was assessed and found intact.    Cranial Nerves II - XII  - II - Right homonymous hemianopia III, IV, VI - Extraocular movements intact. V - Facial sensation intact bilaterally. VII - Facial movement intact bilaterally. VIII - Hearing & vestibular intact bilaterally. X - Palate elevates symmetrically. XI - Chin turning & shoulder shrug intact bilaterally. XII - Tongue protrusion intact.  Motor Strength - The patient's strength was normal in all extremities and pronator drift was absent.  Bulk was normal and fasciculations were absent.   Motor Tone - Muscle tone was assessed at the neck and appendages and was normal.  Reflexes - The patient's reflexes were symmetrical in all extremities and he had no pathological reflexes.  Sensory - Light touch, temperature/pinprick were assessed and were symmetrical.    Coordination - The patient had normal movements in the hands with no ataxia or dysmetria.  Tremor was absent.  Gait and Station - deferred.   ASSESSMENT/PLAN [name] is a 87 y.o. male with history of A. fib not on AC, [name], CHF, OSA on CPAP, hypertension, hyperlipidemia, history of stroke, history of SDH and ICH presenting with right hemianopia and word finding difficulty.   Stroke:  left large PCA infarct embolic likely secondary to chronic  A. fib not on AC CT head large early subacute left PCA infarct MRI Large acute/early subacute left PCA territory cortical/subcortical infarct, predominantly within the left temporal and occipital lobes. Mild petechial hemorrhage within portions of the infarction territory.  Chronic infarct left frontal, bilateral cerebellum. CTA head and neck no LVO, no left PCA occlusion. 2D Echo EF 60 to 65% LDL 45 HgbA1c 5.7 VTE prophylaxis -SCDs No antithrombotic prior to admission, now on aspirin 81 mg daily. He will follow up with Dr. [name] for potential Watchman placement Therapy recommendations: [facility] PT Disposition: Pending  History of stroke 02/2021 left MCA punctate infarct on MRI.  MRA left ICA  cervical-petrous moderate stenosis.  Carotid Doppler negative.  EF 45 to 50%, LDL 54, A1c 5.6.  Etiology likely due to A. fib not on AC, however given patient history of ICH and SAH, discharged on aspirin 81 and Zocor 10. However, wife stated that patient is not on aspirin 81 at home.  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 fib Follows with [name] cardiology Not on [facility] due to previous SDH and ICH Rate controlled On digoxin and metoprolol Supposed to be on aspirin 81, however wife stated that patient not on aspirin at home. With current large left PCA infarct, he will follow up with Dr. [name] to consider potential watchman device Will d/c on ASA 81 at this time  Hypertension Stable Long-term BP goal normotensive  Hyperlipidemia Home meds Zocor 10 LDL 45, at goal < 70 continue home Zocor '10mg'$  at discharge  Other Stroke Risk Factors Advanced Age >/= 36  Cigarette smoker in the past, quit 64 years ago Coronary artery disease Obstructive sleep apnea, on CPAP at home Congestive heart failure  Other [facility] day # 2  Neurology will sign off. Please call with questions. Pt will follow up with stroke clinic NP at [facility] in about 4 weeks. Thanks for the consult.   Rosalin Hawking, MD PhD Stroke Neurology 07/20/2021 2:29 PM    To contact Stroke Continuity provider, please refer to [email or web address] After hours, contact General Neurology

## 2021-07-21 LAB — URINE CULTURE: Culture: >=100000 — AB

## 2021-07-23 ENCOUNTER — Other Ambulatory Visit: Payer: Self-pay

## 2021-07-23 NOTE — Patient Instructions (Signed)
Goals Addressed               This Visit's Progress     Keep or Improve My Strength-Stroke (pt-stated)        Barriers: Knowledge Timeframe:  Short-Term Goal Priority:  High Start Date:      07/23/21                       Expected End Date:      09/10/21                 Follow Up Date 08/10/21  - arrange in-home help services - eat healthy to increase strength - know who to call for help if I fall  [facility] home [facility] ordered   Why is this important?   Before the stroke you probably did not think much about being safe when you are up and about.  Now, it may be harder for you to get around.  It may also be easier for you to trip or fall.  It is common to have muscle weakness after a stroke. You may also feel like you cannot control an arm or leg.  It will be helpful to work with a physical therapist to get your strength and muscle control back.  It is good to stay as active as you can. Walking and stretching help you stay strong and flexible.  The physical therapist will develop an exercise program just for you.     Notes: 07/23/21 Patient doing well from stroke per wife. Patient has some weakness.  She reports therapy has been ordered([facility]).  Appointment with PCP set for 07/27/21 Wife to transport.   Stroke Symptoms Sudden numbness or weakness in the face, arm, or leg, especially on one side of the body. Sudden confusion, trouble speaking, or difficulty understanding speech. Sudden trouble seeing in one or both eyes. Sudden trouble walking, dizziness, loss of balance, or lack of coordination. Sudden severe headache with no known cause. Call 9-1-1 right away if you or someone else has any of these symptoms. During a stroke, every minute counts! Fast treatment Fast treatment can lessen the brain damage that stroke can cause. By knowing the signs and symptoms of stroke, you can take quick action and perhaps save a life.      Track and Manage My Blood Pressure-Hypertension  (pt-stated)        Barriers: Health Behaviors Timeframe:  Long-Range Goal Priority:  High Start Date:     07/23/21                        Expected End Date:   05/10/22                    Follow Up Date 09/10/21   - check blood pressure daily - write blood pressure results in a log or diary    Why is this important?   You won't feel high blood pressure, but it can still hurt your blood vessels.  High blood pressure can cause heart or kidney problems. It can also cause a stroke.  Making lifestyle changes like losing a little weight or eating less salt will help.  Checking your blood pressure at home and at different times of the day can help to control blood pressure.  If the doctor prescribes medicine remember to take it the way the doctor ordered.  Call the office if you cannot afford the medicine or  if there are questions about it.     Notes: 07/23/21 Not checking blood pressure since being at home. Encouraged blood pressure monitoring.  Wife states they normally check daily.   Hypertension Management Discussed Take medications as ordered Limit salt intake. Eat plenty of fruits and vegetables Remain as active as possible Take blood pressure at least weekly at home and record in a notebook to take to doctor's visits.

## 2021-07-23 NOTE — Patient Outreach (Signed)
[facility]) Care Management  [place]  07/23/2021   Endi Tolhurst 30-Nov-1928 [id number]  Subjective: Spoke with DPR wife Enid Derry. She reports patient doing good after hospitalization.  Waiting for therapy to call to get started.  She states patient has some weakness but overall able to do everything by himself.  She stands by as he showers.  Patient has all medications and wife denies questions with medications.   EMMI red flag triggered for no follow up appointment.  Wife states that MD office call today and appointment set for Friday and she will be taking him.     87 -year-old with past medical history significant for A. fib not on anticoagulation due to falls, hypertension, hyperlipidemia, history of CVA 4/22, OSA, [name], SIADH, systolic heart failure, subdural hematoma x3 in 2019 who presented with vision changes,  confusion.  Right brain showed large acute early subacute left PCA territory cortical subcortical infarct predominantly within the left temporal and occipital lobes with mild petechial hemorrhage with thin portions of the infarct territory.    Discussed [facility] services and support. Wife is agreeable to care manager calling for education and support post hospitalization.    Objective:   Encounter Medications:  Outpatient Encounter Medications as of 07/23/2021  Medication Sig   acetaminophen (TYLENOL) 325 MG tablet Take 1-2 tablets (325-650 mg total) by mouth every 4 (four) hours as needed for mild pain.   amLODipine (NORVASC) 2.5 MG tablet Take 1 tablet (2.5 mg total) by mouth every evening.   ascorbic acid (VITAMIN C) 500 MG tablet Take 500 mg by mouth daily.   aspirin EC 81 MG tablet Take 1 tablet (81 mg total) by mouth daily. Swallow whole.   cephALEXin (KEFLEX) 500 MG capsule Take 1 capsule (500 mg total) by mouth every 6 (six) hours for 3 days.   Cholecalciferol (VITAMIN D3) 50 MCG (2000 UT) TABS Take 2,000 Units by mouth every other day.    digoxin (LANOXIN) 0.125 MG tablet Take 1 tablet (0.125 mg total) by mouth daily.   finasteride (PROSCAR) 5 MG tablet Take 1 tablet (5 mg total) by mouth daily.   Melatonin 3 MG TABS Take 3 mg by mouth at bedtime.   metoprolol succinate (TOPROL-XL) 25 MG 24 hr tablet Take 1 tablet (25 mg total) by mouth daily.   Multiple Vitamin (MULTIVITAMIN WITH MINERALS) TABS tablet Take 1 tablet by mouth daily.   simvastatin (ZOCOR) 10 MG tablet Take 1 tablet (10 mg total) by mouth every evening.   zinc gluconate 50 MG tablet Take 50 mg by mouth daily.   No facility-administered encounter medications on file as of 07/23/2021.    Functional Status:  In your present state of health, do you have any difficulty performing the following activities: 07/23/2021 07/20/2021  Hearing? N -  Vision? N -  Difficulty concentrating or making decisions? N -  Walking or climbing stairs? N -  Dressing or bathing? N -  Doing errands, shopping? N N  Preparing Food and eating ? N -  Using the Toilet? N -  In the past six months, have you accidently leaked urine? N -  Do you have problems with loss of bowel control? N -  Managing your Medications? N -  Managing your Finances? N -  Housekeeping or managing your Housekeeping? Y -  Comment wife helps -  Some recent data might be hidden    Fall/Depression Screening: Fall Risk  04/23/2018 03/12/2018  Falls in the past year?  No No   PHQ 2/9 Scores 07/23/2021 03/12/2018  PHQ - 2 Score - 0  PHQ- 9 Score - 9  Exception Documentation Other- indicate reason in comment box -  Not completed wife completes assessment -    Assessment:   Care Plan Care Plan : General Plan of Care (Adult)  Updates made by Jon Billings, RN since 07/23/2021 12:00 AM     Problem: Therapeutic Alliance (General Plan of Care)      Goal: Therapeutic Alliance Established as evidenced by patient engaging with care manager   Start Date: 07/23/2021  Expected End Date: 09/10/2021  Priority: High  Note:    Evidence-based guidance:  Avoid value judgments; convey acceptance.  Encourage collaboration with the treatment team.  Establish rapport; develop trust relationship.  Therapist, art.  Provide emotional support; encourage patient to share feelings of anger, fear and anxiety.  Promote self-reliance and autonomy based on age and ability; discourage overprotection.  Use empathy and nonjudgmental, participatory manner.   Notes:     Task: Develop Relationship to Effect Behavior Change   Due Date: 09/10/2021  Priority: Routine  Responsible User: Jon Billings, RN  Note:   Care Management Activities:    - acceptance conveyed - collaboration with team encouraged - questions encouraged - reassurance provided    Notes: 07/23/21 Spoke with wife Enid Derry [facility])  She is agreeable to care management follow up for education and support post.     Care Plan : Hypertension (Adult)  Updates made by Jon Billings, RN since 07/23/2021 12:00 AM     Problem: Hypertension (Hypertension)      Long-Range Goal: Hypertension Monitored as evidenced by blood pressure less than 140/80   Start Date: 07/23/2021  Expected End Date: 05/10/2022  Priority: High  Note:   Evidence-based guidance:  Promote initial use of ambulatory blood pressure measurements (for 3 days) to rule out "white-coat" effect; identify masked hypertension and presence or absence of nocturnal "dipping" of blood pressure.   Encourage continued use of home blood pressure monitoring and recording in blood pressure log; include symptoms of hypotension or potential medication side effects in log.  Review blood pressure measurements taken inside and outside of the provider office; establish baseline and monitor trends; compare to target ranges or patient goal.  Share overall cardiovascular risk with patient; encourage changes to lifestyle risk factors, including alcohol consumption, smoking, inadequate exercise, poor dietary habits and  stress.   Notes:     Task: Identify and Monitor Blood Pressure Elevation   Due Date: 05/10/2022  Priority: Routine  Responsible User: Jon Billings, RN  Note:   Care Management Activities:    - home or ambulatory blood pressure monitoring encouraged    Notes: 07/23/21 Wife reports not checking blood pressure since being at home but states will be checking daily.   Hypertension Management Discussed Take medications as ordered Limit salt intake. Eat plenty of fruits and vegetables Remain as active as possible Take blood pressure at least weekly at home and record in a notebook to take to doctor's visits.     Care Plan : Stroke (Adult)  Updates made by Jon Billings, RN since 07/23/2021 12:00 AM     Problem: Residual Deficits (Stroke)      Goal: Residual Deficits Prevented or Minimized as eviendenced by patient reports of being stronger.   Start Date: 07/23/2021  Expected End Date: 09/10/2021  Priority: High  Note:   Evidence-based guidance:  Evaluate changes in function that determine patient's rehabilitation plan, including  pain, vision, basic and instrumental activities of daily living, motor control, upper and lower motor function, cognition and emotions.  Prepare patient for long-term (at least 6 months, 5 days per week) interprofessional rehabilitation based on tolerance and degree of functional limitation, beginning soon after stroke event.  Refer to rehabilitation therapy for assessment and individualized program that may include swallowing, functional task training, exercise, assistive device training, enhancement of self-care ability and cardiorespiratory fitness.  Prepare patient for referral to speech language pathologist to assess and treat speech/language and swallowing deficits.  Note: Patient with persistent weight loss or recurrent infections should be urgently assessed and treated.  Promote optimal independence and self-efficacy.  Monitor and manage effects of  pharmacologic therapy that may include serotonin selective reuptake inhibitor, anticholinergic, vitamin supplement, eugeroic (wakefulness-promoting agent) or bisphosphonate.  Screen and assess risk for malnutrition, micronutrient deficiency and dehydration; address tolerance to diet and assess adequacy of fluid intake.  If not able to meet nutrition or fluid requirements, consider recommendations of alternate route for nutrition, hydration and medication while considering quality of life and patient preferences.  Assess and address poststroke fatigue; consider regular exercise, sleep-hygiene practices, avoidance of sedating drugs and excessive alcohol, mindfulness, relaxation and pharmacologic therapy.   Promote regular exercise and physical activity focused on improving strength and cardio-respiratory function based on ability and tolerance.  Encourage appropriate vocational or educational counseling for reentering the community, workplace or school, including a driving evaluation.   Notes:     Task: Optimize Functional Ability   Note:   Care Management Activities:    - balance, gait and fall risk reviewed - knowledge of how to summon emergency services ensured    Notes: 07/23/21 Spoke with wife DPR. She states that patient is getting along well. Patient able to bath and dress self. She is just able to stand by if needed.  Patient has home health ordered for therapy. Main goal is to gain strength.  Wife does report some visual problems since stroke and will be following up with eye doctor.  Discussed stroke symptoms. Patient has follow up with PCP on 07-27-21  Wife takes to appointments.  Stroke Symptoms Sudden numbness or weakness in the face, arm, or leg, especially on one side of the body. Sudden confusion, trouble speaking, or difficulty understanding speech. Sudden trouble seeing in one or both eyes. Sudden trouble walking, dizziness, loss of balance, or lack of coordination. Sudden severe  headache with no known cause. Call 9-1-1 right away if you or someone else has any of these symptoms. During a stroke, every minute counts! Fast treatment Fast treatment can lessen the brain damage that stroke can cause. By knowing the signs and symptoms of stroke, you can take quick action and perhaps save a life.      Goals Addressed               This Visit's Progress     Keep or Improve My Strength-Stroke (pt-stated)        Barriers: Knowledge Timeframe:  Short-Term Goal Priority:  High Start Date:      07/23/21                       Expected End Date:      09/10/21                 Follow Up Date 08/10/21  - arrange in-home help services - eat healthy to increase strength - know who to call for  help if I fall  [facility] home health ordered   Why is this important?   Before the stroke you probably did not think much about being safe when you are up and about.  Now, it may be harder for you to get around.  It may also be easier for you to trip or fall.  It is common to have muscle weakness after a stroke. You may also feel like you cannot control an arm or leg.  It will be helpful to work with a physical therapist to get your strength and muscle control back.  It is good to stay as active as you can. Walking and stretching help you stay strong and flexible.  The physical therapist will develop an exercise program just for you.     Notes: 07/23/21 Patient doing well from stroke per wife. Patient has some weakness.  She reports therapy has been ordered([facility]).  Appointment with PCP set for 07/27/21 Wife to transport.   Stroke Symptoms Sudden numbness or weakness in the face, arm, or leg, especially on one side of the body. Sudden confusion, trouble speaking, or difficulty understanding speech. Sudden trouble seeing in one or both eyes. Sudden trouble walking, dizziness, loss of balance, or lack of coordination. Sudden severe headache with no known cause. Call 9-1-1 right away  if you or someone else has any of these symptoms. During a stroke, every minute counts! Fast treatment Fast treatment can lessen the brain damage that stroke can cause. By knowing the signs and symptoms of stroke, you can take quick action and perhaps save a life.      Track and Manage My Blood Pressure-Hypertension (pt-stated)        Barriers: Health Behaviors Timeframe:  Long-Range Goal Priority:  High Start Date:     07/23/21                        Expected End Date:   05/10/22                    Follow Up Date 09/10/21   - check blood pressure daily - write blood pressure results in a log or diary    Why is this important?   You won't feel high blood pressure, but it can still hurt your blood vessels.  High blood pressure can cause heart or kidney problems. It can also cause a stroke.  Making lifestyle changes like losing a little weight or eating less salt will help.  Checking your blood pressure at home and at different times of the day can help to control blood pressure.  If the doctor prescribes medicine remember to take it the way the doctor ordered.  Call the office if you cannot afford the medicine or if there are questions about it.     Notes: 07/23/21 Not checking blood pressure since being at home. Encouraged blood pressure monitoring.  Wife states they normally check daily.   Hypertension Management Discussed Take medications as ordered Limit salt intake. Eat plenty of fruits and vegetables Remain as active as possible Take blood pressure at least weekly at home and record in a notebook to take to doctor's visits.         Plan: RN CM will provide ongoing education and support to patient through phone calls.   RN CM will send welcome packet with consent to patient.   RN CM will send initial barriers letter, assessment, and care plan to primary care  physician.     Follow-up: Patient agrees to Care Plan and Follow-up. Follow-up in 2-3 week(s)  Jone Baseman, RN,  MSN [place] [facility] Care Management Coordinator Direct Line [phone] Cell [phone] Toll Free: [phone]  Fax: [phone]

## 2021-07-24 DIAGNOSIS — N400 Enlarged prostate without lower urinary tract symptoms: Secondary | ICD-10-CM | POA: Diagnosis not present

## 2021-07-24 DIAGNOSIS — G4733 Obstructive sleep apnea (adult) (pediatric): Secondary | ICD-10-CM | POA: Diagnosis not present

## 2021-07-24 DIAGNOSIS — I272 Pulmonary hypertension, unspecified: Secondary | ICD-10-CM | POA: Diagnosis not present

## 2021-07-24 DIAGNOSIS — I69320 Aphasia following cerebral infarction: Secondary | ICD-10-CM | POA: Diagnosis not present

## 2021-07-24 DIAGNOSIS — I5022 Chronic systolic (congestive) heart failure: Secondary | ICD-10-CM | POA: Diagnosis not present

## 2021-07-24 DIAGNOSIS — I69318 Other symptoms and signs involving cognitive functions following cerebral infarction: Secondary | ICD-10-CM | POA: Diagnosis not present

## 2021-07-24 DIAGNOSIS — I10 Essential (primary) hypertension: Secondary | ICD-10-CM | POA: Diagnosis not present

## 2021-07-24 DIAGNOSIS — D6869 Other thrombophilia: Secondary | ICD-10-CM | POA: Diagnosis not present

## 2021-07-24 DIAGNOSIS — G4700 Insomnia, unspecified: Secondary | ICD-10-CM | POA: Diagnosis not present

## 2021-07-24 DIAGNOSIS — R32 Unspecified urinary incontinence: Secondary | ICD-10-CM | POA: Diagnosis not present

## 2021-07-24 DIAGNOSIS — I255 Ischemic cardiomyopathy: Secondary | ICD-10-CM | POA: Diagnosis not present

## 2021-07-24 DIAGNOSIS — I110 Hypertensive heart disease with heart failure: Secondary | ICD-10-CM | POA: Diagnosis not present

## 2021-07-24 DIAGNOSIS — I420 Dilated cardiomyopathy: Secondary | ICD-10-CM | POA: Diagnosis not present

## 2021-07-24 DIAGNOSIS — E785 Hyperlipidemia, unspecified: Secondary | ICD-10-CM | POA: Diagnosis not present

## 2021-07-24 DIAGNOSIS — H547 Unspecified visual loss: Secondary | ICD-10-CM | POA: Diagnosis not present

## 2021-07-24 DIAGNOSIS — I69351 Hemiplegia and hemiparesis following cerebral infarction affecting right dominant side: Secondary | ICD-10-CM | POA: Diagnosis not present

## 2021-07-24 DIAGNOSIS — I4891 Unspecified atrial fibrillation: Secondary | ICD-10-CM | POA: Diagnosis not present

## 2021-07-24 DIAGNOSIS — I69398 Other sequelae of cerebral infarction: Secondary | ICD-10-CM | POA: Diagnosis not present

## 2021-07-26 ENCOUNTER — Telehealth (HOSPITAL_COMMUNITY): Payer: Self-pay | Admitting: Emergency Medicine

## 2021-07-26 NOTE — Telephone Encounter (Signed)
Reaching out to patient to offer assistance regarding upcoming cardiac imaging study; pt verbalizes understanding of appt date/time, parking situation and where to check in, pre-test NPO status and medications ordered, and verified current allergies; name and call back number provided for further questions should they arise Marchia Bond RN Navigator Cardiac Imaging [facility] [phone] office [phone] cell  Denies iv issues Daily meds

## 2021-07-27 DIAGNOSIS — I10 Essential (primary) hypertension: Secondary | ICD-10-CM | POA: Diagnosis not present

## 2021-07-27 DIAGNOSIS — H547 Unspecified visual loss: Secondary | ICD-10-CM | POA: Diagnosis not present

## 2021-07-27 DIAGNOSIS — I6930 Unspecified sequelae of cerebral infarction: Secondary | ICD-10-CM | POA: Diagnosis not present

## 2021-07-27 DIAGNOSIS — I639 Cerebral infarction, unspecified: Secondary | ICD-10-CM | POA: Diagnosis not present

## 2021-07-27 DIAGNOSIS — I4821 Permanent atrial fibrillation: Secondary | ICD-10-CM | POA: Diagnosis not present

## 2021-07-30 ENCOUNTER — Other Ambulatory Visit: Payer: Self-pay

## 2021-07-30 ENCOUNTER — Ambulatory Visit (HOSPITAL_COMMUNITY)
Admission: RE | Admit: 2021-07-30 | Discharge: 2021-07-30 | Disposition: A | Discharge status: Home / Self Care | Payer: Medicare PPO | Source: Ambulatory Visit | Attending: Cardiovascular Disease | Admitting: Cardiovascular Disease

## 2021-07-30 DIAGNOSIS — I420 Dilated cardiomyopathy: Secondary | ICD-10-CM | POA: Diagnosis not present

## 2021-07-30 DIAGNOSIS — I4821 Permanent atrial fibrillation: Secondary | ICD-10-CM | POA: Insufficient documentation

## 2021-07-30 DIAGNOSIS — I110 Hypertensive heart disease with heart failure: Secondary | ICD-10-CM | POA: Diagnosis not present

## 2021-07-30 DIAGNOSIS — I272 Pulmonary hypertension, unspecified: Secondary | ICD-10-CM | POA: Diagnosis not present

## 2021-07-30 DIAGNOSIS — I69351 Hemiplegia and hemiparesis following cerebral infarction affecting right dominant side: Secondary | ICD-10-CM | POA: Diagnosis not present

## 2021-07-30 DIAGNOSIS — H547 Unspecified visual loss: Secondary | ICD-10-CM | POA: Diagnosis not present

## 2021-07-30 DIAGNOSIS — I5022 Chronic systolic (congestive) heart failure: Secondary | ICD-10-CM | POA: Diagnosis not present

## 2021-07-30 DIAGNOSIS — I69398 Other sequelae of cerebral infarction: Secondary | ICD-10-CM | POA: Diagnosis not present

## 2021-07-30 DIAGNOSIS — I69320 Aphasia following cerebral infarction: Secondary | ICD-10-CM | POA: Diagnosis not present

## 2021-07-30 DIAGNOSIS — I255 Ischemic cardiomyopathy: Secondary | ICD-10-CM | POA: Diagnosis not present

## 2021-07-30 MED ORDER — IOHEXOL 350 MG/ML SOLN
80.0000 mL | Freq: Once | INTRAVENOUS | Status: AC | PRN
  Administered 2021-07-30: 130 mL via INTRAVENOUS

## 2021-07-31 ENCOUNTER — Other Ambulatory Visit: Payer: Self-pay

## 2021-07-31 NOTE — Patient Outreach (Signed)
[place] [facility]) Care Management  07/31/2021  Ryan Savage 1929-07-05 [id number]   EMMI- Stroke RED ON EMMI ALERT Day # 9 Date: 07/30/21 Red Alert Reason:  Questions/problems with meds? Yes    Outreach attempt:Telephone call to patient for EMMI follow up.  Patient reports he is doing good. Addressed red alert.  Patient states he has no questions or problems about medications.  He reports seeing PCP on Friday and visit went well.  He offers no questions or concerns.     Plan: RN CM will outreach as previously scheduled.    Jone Baseman, RN, MSN [facility] Care Management Care Management Coordinator Direct Line [phone] Toll Free: [phone]  Fax: [phone]

## 2021-08-01 DIAGNOSIS — I272 Pulmonary hypertension, unspecified: Secondary | ICD-10-CM | POA: Diagnosis not present

## 2021-08-01 DIAGNOSIS — I110 Hypertensive heart disease with heart failure: Secondary | ICD-10-CM | POA: Diagnosis not present

## 2021-08-01 DIAGNOSIS — H547 Unspecified visual loss: Secondary | ICD-10-CM | POA: Diagnosis not present

## 2021-08-01 DIAGNOSIS — I5022 Chronic systolic (congestive) heart failure: Secondary | ICD-10-CM | POA: Diagnosis not present

## 2021-08-01 DIAGNOSIS — I420 Dilated cardiomyopathy: Secondary | ICD-10-CM | POA: Diagnosis not present

## 2021-08-01 DIAGNOSIS — I255 Ischemic cardiomyopathy: Secondary | ICD-10-CM | POA: Diagnosis not present

## 2021-08-01 DIAGNOSIS — I69398 Other sequelae of cerebral infarction: Secondary | ICD-10-CM | POA: Diagnosis not present

## 2021-08-01 DIAGNOSIS — I69320 Aphasia following cerebral infarction: Secondary | ICD-10-CM | POA: Diagnosis not present

## 2021-08-01 DIAGNOSIS — I69351 Hemiplegia and hemiparesis following cerebral infarction affecting right dominant side: Secondary | ICD-10-CM | POA: Diagnosis not present

## 2021-08-02 DIAGNOSIS — I5022 Chronic systolic (congestive) heart failure: Secondary | ICD-10-CM | POA: Diagnosis not present

## 2021-08-02 DIAGNOSIS — I69320 Aphasia following cerebral infarction: Secondary | ICD-10-CM | POA: Diagnosis not present

## 2021-08-02 DIAGNOSIS — H547 Unspecified visual loss: Secondary | ICD-10-CM | POA: Diagnosis not present

## 2021-08-02 DIAGNOSIS — I69351 Hemiplegia and hemiparesis following cerebral infarction affecting right dominant side: Secondary | ICD-10-CM | POA: Diagnosis not present

## 2021-08-02 DIAGNOSIS — I255 Ischemic cardiomyopathy: Secondary | ICD-10-CM | POA: Diagnosis not present

## 2021-08-02 DIAGNOSIS — I69398 Other sequelae of cerebral infarction: Secondary | ICD-10-CM | POA: Diagnosis not present

## 2021-08-02 DIAGNOSIS — I272 Pulmonary hypertension, unspecified: Secondary | ICD-10-CM | POA: Diagnosis not present

## 2021-08-02 DIAGNOSIS — I110 Hypertensive heart disease with heart failure: Secondary | ICD-10-CM | POA: Diagnosis not present

## 2021-08-02 DIAGNOSIS — I420 Dilated cardiomyopathy: Secondary | ICD-10-CM | POA: Diagnosis not present

## 2021-08-03 DIAGNOSIS — I69398 Other sequelae of cerebral infarction: Secondary | ICD-10-CM | POA: Diagnosis not present

## 2021-08-03 DIAGNOSIS — I5022 Chronic systolic (congestive) heart failure: Secondary | ICD-10-CM | POA: Diagnosis not present

## 2021-08-03 DIAGNOSIS — I272 Pulmonary hypertension, unspecified: Secondary | ICD-10-CM | POA: Diagnosis not present

## 2021-08-03 DIAGNOSIS — I255 Ischemic cardiomyopathy: Secondary | ICD-10-CM | POA: Diagnosis not present

## 2021-08-03 DIAGNOSIS — I69320 Aphasia following cerebral infarction: Secondary | ICD-10-CM | POA: Diagnosis not present

## 2021-08-03 DIAGNOSIS — I110 Hypertensive heart disease with heart failure: Secondary | ICD-10-CM | POA: Diagnosis not present

## 2021-08-03 DIAGNOSIS — H547 Unspecified visual loss: Secondary | ICD-10-CM | POA: Diagnosis not present

## 2021-08-03 DIAGNOSIS — I420 Dilated cardiomyopathy: Secondary | ICD-10-CM | POA: Diagnosis not present

## 2021-08-03 DIAGNOSIS — I69351 Hemiplegia and hemiparesis following cerebral infarction affecting right dominant side: Secondary | ICD-10-CM | POA: Diagnosis not present

## 2021-08-06 ENCOUNTER — Encounter: Payer: Self-pay | Admitting: Cardiovascular Disease

## 2021-08-06 ENCOUNTER — Ambulatory Visit: Payer: Medicare PPO | Admitting: Cardiovascular Disease

## 2021-08-06 VITALS — BP 120/74 | HR 62 | Ht 68.0 in | Wt 168.4 lb

## 2021-08-06 DIAGNOSIS — I110 Hypertensive heart disease with heart failure: Secondary | ICD-10-CM | POA: Diagnosis not present

## 2021-08-06 DIAGNOSIS — I69351 Hemiplegia and hemiparesis following cerebral infarction affecting right dominant side: Secondary | ICD-10-CM | POA: Diagnosis not present

## 2021-08-06 DIAGNOSIS — I420 Dilated cardiomyopathy: Secondary | ICD-10-CM | POA: Diagnosis not present

## 2021-08-06 DIAGNOSIS — I4821 Permanent atrial fibrillation: Secondary | ICD-10-CM | POA: Diagnosis not present

## 2021-08-06 DIAGNOSIS — I272 Pulmonary hypertension, unspecified: Secondary | ICD-10-CM | POA: Diagnosis not present

## 2021-08-06 DIAGNOSIS — I69398 Other sequelae of cerebral infarction: Secondary | ICD-10-CM | POA: Diagnosis not present

## 2021-08-06 DIAGNOSIS — H547 Unspecified visual loss: Secondary | ICD-10-CM | POA: Diagnosis not present

## 2021-08-06 DIAGNOSIS — I69320 Aphasia following cerebral infarction: Secondary | ICD-10-CM | POA: Diagnosis not present

## 2021-08-06 DIAGNOSIS — I5022 Chronic systolic (congestive) heart failure: Secondary | ICD-10-CM | POA: Diagnosis not present

## 2021-08-06 DIAGNOSIS — I255 Ischemic cardiomyopathy: Secondary | ICD-10-CM | POA: Diagnosis not present

## 2021-08-06 NOTE — Patient Instructions (Addendum)
Medication Instructions:  No changes *If you need a refill on your cardiac medications before your next appointment, please call your pharmacy*   Lab Work: none If you have labs (blood work) drawn today and your tests are completely normal, you will receive your results only by: [place] (if you have MyChart) OR A paper copy in the mail If you have any lab test that is abnormal or we need to change your treatment, we will call you to review the results.   Testing/Procedures: none   Follow-Up: With Dr. [name] as needed. With [name] in 2-3 months (per patient and Dr. [name] request)

## 2021-08-06 NOTE — Progress Notes (Signed)
Cardiology Office Note:    Date:  08/07/2021   ID:  Ryan Savage, DOB Dec 08, 1928, MRN [id number]  PCP:  Lajean Manes, MD   [facility] Providers Cardiologist:  Fransico Him, MD     Referring MD: Lajean Manes, MD   Chief Complaint  Patient presents with   Atrial Fibrillation    History of Present Illness:    Ryan Savage is a 87 y.o. male with a hx of permanent atrial fibrillation not previously on anticoagulation because of subdural hematoma and recurrent falls back in 2019.  The patient has other comorbid cardiac conditions including pulmonary hypertension, mitral valve prolapse with moderate mitral regurgitation, coronary artery disease with chronic total occlusions of the LAD and RCA, and severe ischemic cardiomyopathy with LVEF 30 to 35%.  The patient had a stroke in April 2022 and more recently had a cortical infarct earlier this month when he presented with right-sided visual disturbances.  The patient is here with his wife today. Since his stroke, he is having problems with his vision, especially with seeing anything on his right side.  He has had some trouble with balance and ambulation.  He also has had some problems with his memory and feels like he has lost 'half of his vocabulary.' Today, he denies symptoms of palpitations, chest pain, shortness of breath, orthopnea, PND, lower extremity edema, dizziness, or syncope.   Past Medical History:  Diagnosis Date   Aortic stenosis 05/22/2018   Mild with mean AVG 29mmHg by echo 05/2018   Atrial fibrillation, chronic (HCC)    Not on anticoagulation due to history of bilateral subdural bleeds due to recurrent falls   BPH (benign prostatic hypertrophy)    [name] (coronary artery disease), native coronary artery    Severe two-vessel [name] with chronically occluded LAD and RCA with widely patent left main, intermediate, and left circumflex branches.  On medical management.  He has extensive collaterals.   Chronic systolic heart  failure (HCC)    Ischemic dilated cardia myopathy with EF 30-35% by echo 11/2017 felt due to a combination of ischemia as well as tachycardia induced from A. fib with RVR.  Repeat echo 05/2018 with EF 45-50%.   [name] (degenerative joint disease)    GERD (gastroesophageal reflux disease)    Hyperlipidemia    LDL goal less then 100   Hypertension    Microscopic hematuria    [name]   Mitral valve prolapse    with mild MR   OSA (obstructive sleep apnea)    Pulmonary HTN (HCC)    PASP 6mmHg by echo 05/2018 but normal pressures on right heart cath.   SDH (subdural hematoma) ([place]) 01/2018   SIADH (syndrome of inappropriate ADH production) ([place])    Skin cancer    "side of my nose" (11/19/2017)   Stroke ([place]) 12/2017   "fully recovered"   Thrombocytopenia ([place])    Mild- platelet count 143,000 on 02/2011, stable 08/2011    Past Surgical History:  Procedure Laterality Date   APPENDECTOMY  1950   BACK SURGERY     BURR HOLE Bilateral 01/25/2018   Procedure: Haskell Flirt;  Surgeon: Kary Kos, MD;  Location: [facility];  Service: Neurosurgery;  Laterality: Bilateral;   BURR HOLE Left 02/20/2018   Procedure: BURR HOLES for subdural hematoma;  Surgeon: Newman Pies, MD;  Location: [facility];  Service: Neurosurgery;  Laterality: Left;   BURR HOLE Left 03/20/2018   Procedure: Craniotomy for subdural hematoma;  Surgeon: Kristeen Miss, MD;  Location: [facility];  Service: Neurosurgery;  Laterality: Left;   FOREARM FRACTURE SURGERY Right [place]   ruptured disc repair   RIGHT/LEFT HEART CATH AND CORONARY ANGIOGRAPHY N/A 11/21/2017   Procedure: RIGHT/LEFT HEART CATH AND CORONARY ANGIOGRAPHY;  Surgeon: Sherren Mocha, MD;  Location: [facility];  Service: Cardiovascular;  Laterality: N/A;   SKIN CANCER EXCISION Left    "side of my nose"    Current Medications: Current Meds  Medication Sig   acetaminophen (TYLENOL) 325 MG tablet Take 1-2 tablets (325-650 mg total) by mouth every  4 (four) hours as needed for mild pain.   amLODipine (NORVASC) 2.5 MG tablet Take 1 tablet (2.5 mg total) by mouth every evening.   ascorbic acid (VITAMIN C) 500 MG tablet Take 500 mg by mouth daily.   aspirin EC 81 MG tablet Take 1 tablet (81 mg total) by mouth daily. Swallow whole.   Cholecalciferol (VITAMIN D3) 50 MCG (2000 UT) TABS Take 2,000 Units by mouth every other day.   digoxin (LANOXIN) 0.125 MG tablet Take 1 tablet (0.125 mg total) by mouth daily.   finasteride (PROSCAR) 5 MG tablet Take 1 tablet (5 mg total) by mouth daily.   Melatonin 3 MG TABS Take 3 mg by mouth at bedtime.   metoprolol succinate (TOPROL-XL) 25 MG 24 hr tablet Take 1 tablet (25 mg total) by mouth daily.   Multiple Vitamin (MULTIVITAMIN WITH MINERALS) TABS tablet Take 1 tablet by mouth daily.   simvastatin (ZOCOR) 10 MG tablet Take 1 tablet (10 mg total) by mouth every evening.   zinc gluconate 50 MG tablet Take 50 mg by mouth daily.     Allergies:   Methylphenidate derivatives, Warfarin sodium, and Novocain [procaine]   Social History   Socioeconomic History   Marital status: Married    Spouse name: Not on file   Number of children: Not on file   Years of education: Not on file   Highest education level: Not on file  Occupational History   Occupation: retired  Tobacco Use   Smoking status: Former    Years: 3.00    Types: Cigarettes    Quit date: 1958    Years since quitting: 64.7   Smokeless tobacco: Never  Vaping Use   Vaping Use: Never used  Substance and Sexual Activity   Alcohol use: No   Drug use: No   Sexual activity: Not on file  Other Topics Concern   Not on file  Social History Narrative   Not on file   Social Determinants of Health   Financial Resource Strain: Not on file  Food Insecurity: Not on file  Transportation Needs: No Transportation Needs   Lack of Transportation (Medical): No   Lack of Transportation (Non-Medical): No  Physical Activity: Not on file  Stress: Not  on file  Social Connections: Not on file     Family History: The patient's family history includes [name] in his brother and father; Colon cancer in his brother and sister; Heart attack in his brother and father; Heart failure in his mother.  ROS:   Please see the history of present illness.    All other systems reviewed and are negative.  EKGs/Labs/Other Studies Reviewed:    The following studies were reviewed today: Cardiac CTA 07/30/2021: FINDINGS: 3 vessel coronary calcium noted with score 1717. Age is outside [place]   Moderate LAE, severe RAE. No LAA thrombus no ASD No VSD No PFO. No pericardial effusion Normal  aortic root diameter 3.4 cm   LUPV:  Ostium 14.3  mm    area 1.64 cm2   LLPV:   Ostium 15.2 mm   area 2.45 cm2   RUPV:  Ostium 14.5 mm   area 1.63 cm2   RLPV:  Ostium 16.1 mm   area 1.74 cm2   LAA: Morphology is an anteriorly directed chicken wing Largest landing zone diameter is 24.8 mm x 19.1 mm with length 22.9 mm   IMPRESSION: 1.  Moderate LAA Severe RAE.  No LAA thrombus   2. Anteriorly directed Chicken Wing appendage with largest landing zone diameter 24.8 mm suitable for a 31 mm Watchman FLX device Will have to direct device anteriorly for length   3.  Normal aortic root 3.4 cm   4.  Normal PV anatomy measurements above   5.  No pericardial effusion   6.  Calcium score 1717 age outside [place]   7.  Known CTO of mid RCA and LAD with collaterals   8. Inferior posterior trans septal with double curve catheter suggested   Recent Labs: 07/17/2021: ALT 16; Hemoglobin 12.9; Platelets 146 07/18/2021: BUN 15; Creatinine, Ser 0.90; Potassium 3.9; Sodium 130  Recent Lipid Panel    Component Value Date/Time   CHOL 100 07/18/2021 0235   CHOL 107 01/12/2019 0832   TRIG 58 07/18/2021 0235   HDL 43 07/18/2021 0235   HDL 37 (L) 01/12/2019 0832   CHOLHDL 2.3 07/18/2021 0235   VLDL 12 07/18/2021 0235   LDLCALC 45 07/18/2021 0235   LDLCALC 51  01/12/2019 0832     Risk Assessment/Calculations:    CHA2DS2-VASc Score = 7   This indicates a 11.2% annual risk of stroke. The patient's score is based upon: CHF History: 1 HTN History: 1 Diabetes History: 0 Stroke History: 2 Vascular Disease History: 1 Age Score: 2 Gender Score: 0         Physical Exam:    VS:  BP 120/74   Pulse 62   Ht 5\' 8"  (1.727 m)   Wt 168 lb 6.4 oz (76.4 kg)   SpO2 97%   BMI 25.61 kg/m     Wt Readings from Last 3 Encounters:  08/06/21 168 lb 6.4 oz (76.4 kg)  07/20/21 175 lb 0.7 oz (79.4 kg)  03/28/21 174 lb (78.9 kg)     GEN:  Well nourished, well developed elderly male in no acute distress HEENT: Normal NECK: No JVD; No carotid bruits LYMPHATICS: No lymphadenopathy CARDIAC: irregularly irregular, 2/6 systolic murmur at the LLSB RESPIRATORY:  Clear to auscultation without rales, wheezing or rhonchi  ABDOMEN: Soft, non-tender, non-distended MUSCULOSKELETAL:  No edema; No deformity  SKIN: Warm and dry NEUROLOGIC:  Alert and oriented x 3 PSYCHIATRIC:  Normal affect   ASSESSMENT:    1. Permanent atrial fibrillation (HCC)    PLAN:    In order of problems listed above:  The patient is seen back after recent hospital consultation for consideration of watchman left atrial appendage occlusion.  I spoke with the patient and his wife at length today.  We discussed the watchman procedure in detail.  I demonstrated a procedural video to the patient and explained each step of the procedure.  He understands that the procedure is performed under general anesthesia with transesophageal echo guidance.  He understands the risk of serious complications such as stroke, myocardial infarction, device embolization, hemopericardium with cardiac tamponade, need for pericardiocentesis, need for emergency surgery, vascular injury, all occur at a low  frequency of less than 1%.  We discussed the small risk of device related thrombus occurring within the first year  of implantation occurs at approximately 2%.  The patient clearly understands the rationale for watchman implantation.  However, considering his advanced age and significant functional limitation at least partly related to his recent stroke.  He is not inclined to undergo any further procedures at this point.  He does not wish to proceed with watchman implantation, but is open to discussing it further when he sees [name] back in a few months.  It seems that he favors conservative medical therapy at age 37 and this is certainly a reasonable approach.  He and his wife are very fearful of him taking even short-term anticoagulation with his history of subdural hemorrhage requiring surgery.  I would be happy to see him back in the future if he changes his mind.        Medication Adjustments/Labs and Tests Ordered: Current medicines are reviewed at length with the patient today.  Concerns regarding medicines are outlined above.  No orders of the defined types were placed in this encounter.  No orders of the defined types were placed in this encounter.   Patient Instructions  Medication Instructions:  No changes *If you need a refill on your cardiac medications before your next appointment, please call your pharmacy*   Lab Work: none If you have labs (blood work) drawn today and your tests are completely normal, you will receive your results only by: [facility] (if you have MyChart) OR A paper copy in the mail If you have any lab test that is abnormal or we need to change your treatment, we will call you to review the results.   Testing/Procedures: none   Follow-Up: With Dr. [name] as needed. With [name] in 2-3 months (per patient and Dr. [name] request)      Signed, Sherren Mocha, MD  08/07/2021 1:55 PM    [place]

## 2021-08-07 ENCOUNTER — Encounter: Payer: Self-pay | Admitting: Cardiovascular Disease

## 2021-08-07 DIAGNOSIS — I69320 Aphasia following cerebral infarction: Secondary | ICD-10-CM | POA: Diagnosis not present

## 2021-08-07 DIAGNOSIS — H547 Unspecified visual loss: Secondary | ICD-10-CM | POA: Diagnosis not present

## 2021-08-07 DIAGNOSIS — I69351 Hemiplegia and hemiparesis following cerebral infarction affecting right dominant side: Secondary | ICD-10-CM | POA: Diagnosis not present

## 2021-08-07 DIAGNOSIS — I420 Dilated cardiomyopathy: Secondary | ICD-10-CM | POA: Diagnosis not present

## 2021-08-07 DIAGNOSIS — I272 Pulmonary hypertension, unspecified: Secondary | ICD-10-CM | POA: Diagnosis not present

## 2021-08-07 DIAGNOSIS — I110 Hypertensive heart disease with heart failure: Secondary | ICD-10-CM | POA: Diagnosis not present

## 2021-08-07 DIAGNOSIS — I5022 Chronic systolic (congestive) heart failure: Secondary | ICD-10-CM | POA: Diagnosis not present

## 2021-08-07 DIAGNOSIS — I255 Ischemic cardiomyopathy: Secondary | ICD-10-CM | POA: Diagnosis not present

## 2021-08-07 DIAGNOSIS — I69398 Other sequelae of cerebral infarction: Secondary | ICD-10-CM | POA: Diagnosis not present

## 2021-08-08 ENCOUNTER — Other Ambulatory Visit: Payer: Self-pay

## 2021-08-08 DIAGNOSIS — I69351 Hemiplegia and hemiparesis following cerebral infarction affecting right dominant side: Secondary | ICD-10-CM | POA: Diagnosis not present

## 2021-08-08 DIAGNOSIS — I110 Hypertensive heart disease with heart failure: Secondary | ICD-10-CM | POA: Diagnosis not present

## 2021-08-08 DIAGNOSIS — I420 Dilated cardiomyopathy: Secondary | ICD-10-CM | POA: Diagnosis not present

## 2021-08-08 DIAGNOSIS — I272 Pulmonary hypertension, unspecified: Secondary | ICD-10-CM | POA: Diagnosis not present

## 2021-08-08 DIAGNOSIS — I69320 Aphasia following cerebral infarction: Secondary | ICD-10-CM | POA: Diagnosis not present

## 2021-08-08 DIAGNOSIS — I5022 Chronic systolic (congestive) heart failure: Secondary | ICD-10-CM | POA: Diagnosis not present

## 2021-08-08 DIAGNOSIS — I69398 Other sequelae of cerebral infarction: Secondary | ICD-10-CM | POA: Diagnosis not present

## 2021-08-08 DIAGNOSIS — H547 Unspecified visual loss: Secondary | ICD-10-CM | POA: Diagnosis not present

## 2021-08-08 DIAGNOSIS — I255 Ischemic cardiomyopathy: Secondary | ICD-10-CM | POA: Diagnosis not present

## 2021-08-08 NOTE — Patient Instructions (Signed)
Goals Addressed               This Visit's Progress     Keep or Improve My Strength-Stroke (pt-stated)   On track     Barriers: Knowledge Timeframe:  Short-Term Goal Priority:  High Start Date:      07/23/21                       Expected End Date:      09/10/21                 Follow Up Date 09/10/21  - attend 90 percent of physical therapy appointments - eat healthy to increase strength - know who to call for help if I fall  [facility] invloved Why is this important?   Before the stroke you probably did not think much about being safe when you are up and about.  Now, it may be harder for you to get around.  It may also be easier for you to trip or fall.  It is common to have muscle weakness after a stroke. You may also feel like you cannot control an arm or leg.  It will be helpful to work with a physical therapist to get your strength and muscle control back.  It is good to stay as active as you can. Walking and stretching help you stay strong and flexible.  The physical therapist will develop an exercise program just for you.     Notes: 07/23/21 Patient doing well from stroke per wife. Patient has some weakness.  She reports therapy has been ordered([facility]).  Appointment with PCP set for 07/27/21 Wife to transport.   Stroke Symptoms Sudden numbness or weakness in the face, arm, or leg, especially on one side of the body. Sudden confusion, trouble speaking, or difficulty understanding speech. Sudden trouble seeing in one or both eyes. Sudden trouble walking, dizziness, loss of balance, or lack of coordination. Sudden severe headache with no known cause. Call 9-1-1 right away if you or someone else has any of these symptoms. During a stroke, every minute counts! Fast treatment Fast treatment can lessen the brain damage that stroke can cause. By knowing the signs and symptoms of stroke, you can take quick action and perhaps save a life. 08/08/21 Patient continues to  work with therapy. He voices some frustration with word finding.  Encouraged patient to take his time when expressing himself and continue work with therapy.  No concerns.        Track and Manage My Blood Pressure-Hypertension (pt-stated)   On track     Barriers: Health Behaviors Timeframe:  Long-Range Goal Priority:  High Start Date:     07/23/21                        Expected End Date:   05/10/22                    Follow Up Date 09/10/21   - check blood pressure daily - write blood pressure results in a log or diary    Why is this important?   You won't feel high blood pressure, but it can still hurt your blood vessels.  High blood pressure can cause heart or kidney problems. It can also cause a stroke.  Making lifestyle changes like losing a little weight or eating less salt will help.  Checking your blood pressure at home and at  different times of the day can help to control blood pressure.  If the doctor prescribes medicine remember to take it the way the doctor ordered.  Call the office if you cannot afford the medicine or if there are questions about it.     Notes: 07/23/21 Not checking blood pressure since being at home. Encouraged blood pressure monitoring.  Wife states they normally check daily.   Hypertension Management Discussed Take medications as ordered Limit salt intake. Eat plenty of fruits and vegetables Remain as active as possible Take blood pressure at least weekly at home and record in a notebook to take to doctor's visits.  08/08/21 Patient blood pressure last report was 120/74. Patient taking medications as prescribed.  Discussed diet as management of hypertension as well.

## 2021-08-08 NOTE — Patient Outreach (Signed)
[facility]) Care Management  [place]  08/08/2021   Ryan Savage [birth year] [id number]  Subjective: Telephone call to patient. He reports doing okay. He states he still has problems with his right eye as a result of his stroke and will be seeing the eye doctor. Patient works with therapy for strengthening and word finding. Encouraged to take his time to avoid frustration.  He verbalized understanding.   Objective:   Encounter Medications:  Outpatient Encounter Medications as of 08/08/2021  Medication Sig   acetaminophen (TYLENOL) 325 MG tablet Take 1-2 tablets (325-650 mg total) by mouth every 4 (four) hours as needed for mild pain.   amLODipine (NORVASC) 2.5 MG tablet Take 1 tablet (2.5 mg total) by mouth every evening.   ascorbic acid (VITAMIN C) 500 MG tablet Take 500 mg by mouth daily.   aspirin EC 81 MG tablet Take 1 tablet (81 mg total) by mouth daily. Swallow whole.   Cholecalciferol (VITAMIN D3) 50 MCG (2000 UT) TABS Take 2,000 Units by mouth every other day.   digoxin (LANOXIN) 0.125 MG tablet Take 1 tablet (0.125 mg total) by mouth daily.   finasteride (PROSCAR) 5 MG tablet Take 1 tablet (5 mg total) by mouth daily.   Melatonin 3 MG TABS Take 3 mg by mouth at bedtime.   metoprolol succinate (TOPROL-XL) 25 MG 24 hr tablet Take 1 tablet (25 mg total) by mouth daily.   Multiple Vitamin (MULTIVITAMIN WITH MINERALS) TABS tablet Take 1 tablet by mouth daily.   simvastatin (ZOCOR) 10 MG tablet Take 1 tablet (10 mg total) by mouth every evening.   zinc gluconate 50 MG tablet Take 50 mg by mouth daily.   No facility-administered encounter medications on file as of 08/08/2021.    Functional Status:  In your present state of health, do you have any difficulty performing the following activities: 07/23/2021 07/20/2021  Hearing? N -  Vision? N -  Difficulty concentrating or making decisions? N -  Walking or climbing stairs? N -  Dressing or bathing? N -   Doing errands, shopping? N N  Preparing Food and eating ? N -  Using the Toilet? N -  In the past six months, have you accidently leaked urine? N -  Do you have problems with loss of bowel control? N -  Managing your Medications? N -  Managing your Finances? N -  Housekeeping or managing your Housekeeping? Y -  Comment wife helps -  Some recent data might be hidden    Fall/Depression Screening: Fall Risk  07/31/2021 04/23/2018 03/12/2018  Falls in the past year? 0 No No   PHQ 2/9 Scores 07/31/2021 07/23/2021 03/12/2018  PHQ - 2 Score 0 - 0  PHQ- 9 Score - - 9  Exception Documentation - Other- indicate reason in comment box -  Not completed - wife completes assessment -    Assessment:   Care Plan Care Plan : General Plan of Care (Adult)  Updates made by Jon Billings, RN since 08/08/2021 12:00 AM     Problem: Therapeutic Alliance (General Plan of Care)      Goal: Therapeutic Alliance Established as evidenced by patient engaging with care manager   Start Date: 07/23/2021  Expected End Date: 09/10/2021  This Visit's Progress: On track  Priority: High  Note:   Evidence-based guidance:  Avoid value judgments; convey acceptance.  Encourage collaboration with the treatment team.  Establish rapport; develop trust relationship.  Therapist, art.  Provide emotional support; encourage  patient to share feelings of anger, fear and anxiety.  Promote self-reliance and autonomy based on age and ability; discourage overprotection.  Use empathy and nonjudgmental, participatory manner.   Notes:     Task: Develop Relationship to Effect Behavior Change   Due Date: 09/10/2021  Priority: Routine  Responsible User: Jon Billings, RN  Note:   Care Management Activities:    - acceptance conveyed - collaboration with team encouraged - questions encouraged - reassurance provided    Notes: 07/23/21 Spoke with wife Enid D[facility])  She is agreeable to care management follow up for  education and support post.  08/08/21 Patient reports he is doing ok and still agreeable to care management for support and education.      Care Plan : Hypertension (Adult)  Updates made by Jon Billings, RN since 08/08/2021 12:00 AM     Problem: Hypertension (Hypertension)      Long-Range Goal: Hypertension Monitored as evidenced by blood pressure less than 140/80   Start Date: 07/23/2021  Expected End Date: 05/10/2022  This Visit's Progress: On track  Priority: High  Note:   Evidence-based guidance:  Promote initial use of ambulatory blood pressure measurements (for 3 days) to rule out "white-coat" effect; identify masked hypertension and presence or absence of nocturnal "dipping" of blood pressure.   Encourage continued use of home blood pressure monitoring and recording in blood pressure log; include symptoms of hypotension or potential medication side effects in log.  Review blood pressure measurements taken inside and outside of the provider office; establish baseline and monitor trends; compare to target ranges or patient goal.  Share overall cardiovascular risk with patient; encourage changes to lifestyle risk factors, including alcohol consumption, smoking, inadequate exercise, poor dietary habits and stress.   Notes:     Task: Identify and Monitor Blood Pressure Elevation   Due Date: 05/10/2022  Priority: Routine  Responsible User: Jon Billings, RN  Note:   Care Management Activities:    - home or ambulatory blood pressure monitoring encouraged    Notes: 07/23/21 Wife reports not checking blood pressure since being at home but states will be checking daily.   Hypertension Management Discussed Take medications as ordered Limit salt intake. Eat plenty of fruits and vegetables Remain as active as possible Take blood pressure at least weekly at home and record in a notebook to take to doctor's visits.  08/08/21 Patient blood pressure last check was 120/74.  Discussed low salt  diet. No concerns.     Care Plan : Stroke (Adult)  Updates made by Jon Billings, RN since 08/08/2021 12:00 AM     Problem: Residual Deficits (Stroke)      Goal: Residual Deficits Prevented or Minimized as eviendenced by patient reports of being stronger.   Start Date: 07/23/2021  Expected End Date: 09/10/2021  This Visit's Progress: On track  Priority: High  Note:   Evidence-based guidance:  Evaluate changes in function that determine patient's rehabilitation plan, including pain, vision, basic and instrumental activities of daily living, motor control, upper and lower motor function, cognition and emotions.  Prepare patient for long-term (at least 6 months, 5 days per week) interprofessional rehabilitation based on tolerance and degree of functional limitation, beginning soon after stroke event.  Refer to rehabilitation therapy for assessment and individualized program that may include swallowing, functional task training, exercise, assistive device training, enhancement of self-care ability and cardiorespiratory fitness.  Prepare patient for referral to speech language pathologist to assess and treat speech/language and swallowing deficits.  Note: Patient with persistent weight loss or recurrent infections should be urgently assessed and treated.  Promote optimal independence and self-efficacy.  Monitor and manage effects of pharmacologic therapy that may include serotonin selective reuptake inhibitor, anticholinergic, vitamin supplement, eugeroic (wakefulness-promoting agent) or bisphosphonate.  Screen and assess risk for malnutrition, micronutrient deficiency and dehydration; address tolerance to diet and assess adequacy of fluid intake.  If not able to meet nutrition or fluid requirements, consider recommendations of alternate route for nutrition, hydration and medication while considering quality of life and patient preferences.  Assess and address poststroke fatigue; consider regular  exercise, sleep-hygiene practices, avoidance of sedating drugs and excessive alcohol, mindfulness, relaxation and pharmacologic therapy.   Promote regular exercise and physical activity focused on improving strength and cardio-respiratory function based on ability and tolerance.  Encourage appropriate vocational or educational counseling for reentering the community, workplace or school, including a driving evaluation.   Notes:     Task: Optimize Functional Ability   Due Date: 09/10/2021  Priority: Routine  Responsible User: Jon Billings, RN  Note:   Care Management Activities:    - balance, gait and fall risk reviewed - knowledge of how to summon emergency services ensured    Notes: 07/23/21 Spoke with wife DPR. She states that patient is getting along well. Patient able to bath and dress self. She is just able to stand by if needed.  Patient has home health ordered for therapy. Main goal is to gain strength.  Wife does report some visual problems since stroke and will be following up with eye doctor.  Discussed stroke symptoms. Patient has follow up with PCP on 07-27-21  Wife takes to appointments.  Stroke Symptoms Sudden numbness or weakness in the face, arm, or leg, especially on one side of the body. Sudden confusion, trouble speaking, or difficulty understanding speech. Sudden trouble seeing in one or both eyes. Sudden trouble walking, dizziness, loss of balance, or lack of coordination. Sudden severe headache with no known cause. Call 9-1-1 right away if you or someone else has any of these symptoms. During a stroke, every minute counts! Fast treatment Fast treatment can lessen the brain damage that stroke can cause. By knowing the signs and symptoms of stroke, you can take quick action and perhaps save a life. 08/08/21 Patient reports doing ok.  Patient works with therapy for strengthening and speech therapy for help with word finding.  Discussed stroke management.        Goals  Addressed               This Visit's Progress     Keep or Improve My Strength-Stroke (pt-stated)   On track     Barriers: Knowledge Timeframe:  Short-Term Goal Priority:  High Start Date:      07/23/21                       Expected End Date:      09/10/21                 Follow Up Date 09/10/21  - attend 90 percent of physical therapy appointments - eat healthy to increase strength - know who to call for help if I fall  [facility] invloved Why is this important?   Before the stroke you probably did not think much about being safe when you are up and about.  Now, it may be harder for you to get around.  It may also be easier  for you to trip or fall.  It is common to have muscle weakness after a stroke. You may also feel like you cannot control an arm or leg.  It will be helpful to work with a physical therapist to get your strength and muscle control back.  It is good to stay as active as you can. Walking and stretching help you stay strong and flexible.  The physical therapist will develop an exercise program just for you.     Notes: 07/23/21 Patient doing well from stroke per wife. Patient has some weakness.  She reports therapy has been ordered([facility]).  Appointment with PCP set for 07/27/21 Wife to transport.   Stroke Symptoms Sudden numbness or weakness in the face, arm, or leg, especially on one side of the body. Sudden confusion, trouble speaking, or difficulty understanding speech. Sudden trouble seeing in one or both eyes. Sudden trouble walking, dizziness, loss of balance, or lack of coordination. Sudden severe headache with no known cause. Call 9-1-1 right away if you or someone else has any of these symptoms. During a stroke, every minute counts! Fast treatment Fast treatment can lessen the brain damage that stroke can cause. By knowing the signs and symptoms of stroke, you can take quick action and perhaps save a life. 08/08/21 Patient continues to work with  therapy. He voices some frustration with word finding.  Encouraged patient to take his time when expressing himself and continue work with therapy.  No concerns.        Track and Manage My Blood Pressure-Hypertension (pt-stated)   On track     Barriers: Health Behaviors Timeframe:  Long-Range Goal Priority:  High Start Date:     07/23/21                        Expected End Date:   05/10/22                    Follow Up Date 09/10/21   - check blood pressure daily - write blood pressure results in a log or diary    Why is this important?   You won't feel high blood pressure, but it can still hurt your blood vessels.  High blood pressure can cause heart or kidney problems. It can also cause a stroke.  Making lifestyle changes like losing a little weight or eating less salt will help.  Checking your blood pressure at home and at different times of the day can help to control blood pressure.  If the doctor prescribes medicine remember to take it the way the doctor ordered.  Call the office if you cannot afford the medicine or if there are questions about it.     Notes: 07/23/21 Not checking blood pressure since being at home. Encouraged blood pressure monitoring.  Wife states they normally check daily.   Hypertension Management Discussed Take medications as ordered Limit salt intake. Eat plenty of fruits and vegetables Remain as active as possible Take blood pressure at least weekly at home and record in a notebook to take to doctor's visits.  08/08/21 Patient blood pressure last report was 120/74. Patient taking medications as prescribed.  Discussed diet as management of hypertension as well.          Plan:  Follow-up: Patient agrees to Care Plan and Follow-up. Follow-up in 2-3 week(s)  Jone Baseman, RN, MSN [place] [facility] Care Management Coordinator Direct Line [phone] Cell [phone] Toll Free: [phone]  Fax: [phone]

## 2021-08-09 DIAGNOSIS — I420 Dilated cardiomyopathy: Secondary | ICD-10-CM | POA: Diagnosis not present

## 2021-08-09 DIAGNOSIS — I5022 Chronic systolic (congestive) heart failure: Secondary | ICD-10-CM | POA: Diagnosis not present

## 2021-08-09 DIAGNOSIS — I272 Pulmonary hypertension, unspecified: Secondary | ICD-10-CM | POA: Diagnosis not present

## 2021-08-09 DIAGNOSIS — I69320 Aphasia following cerebral infarction: Secondary | ICD-10-CM | POA: Diagnosis not present

## 2021-08-09 DIAGNOSIS — I110 Hypertensive heart disease with heart failure: Secondary | ICD-10-CM | POA: Diagnosis not present

## 2021-08-09 DIAGNOSIS — I69398 Other sequelae of cerebral infarction: Secondary | ICD-10-CM | POA: Diagnosis not present

## 2021-08-09 DIAGNOSIS — I255 Ischemic cardiomyopathy: Secondary | ICD-10-CM | POA: Diagnosis not present

## 2021-08-09 DIAGNOSIS — I69351 Hemiplegia and hemiparesis following cerebral infarction affecting right dominant side: Secondary | ICD-10-CM | POA: Diagnosis not present

## 2021-08-09 DIAGNOSIS — H547 Unspecified visual loss: Secondary | ICD-10-CM | POA: Diagnosis not present

## 2021-08-13 DIAGNOSIS — I110 Hypertensive heart disease with heart failure: Secondary | ICD-10-CM | POA: Diagnosis not present

## 2021-08-13 DIAGNOSIS — I272 Pulmonary hypertension, unspecified: Secondary | ICD-10-CM | POA: Diagnosis not present

## 2021-08-13 DIAGNOSIS — H547 Unspecified visual loss: Secondary | ICD-10-CM | POA: Diagnosis not present

## 2021-08-13 DIAGNOSIS — I69320 Aphasia following cerebral infarction: Secondary | ICD-10-CM | POA: Diagnosis not present

## 2021-08-13 DIAGNOSIS — I255 Ischemic cardiomyopathy: Secondary | ICD-10-CM | POA: Diagnosis not present

## 2021-08-13 DIAGNOSIS — I5022 Chronic systolic (congestive) heart failure: Secondary | ICD-10-CM | POA: Diagnosis not present

## 2021-08-13 DIAGNOSIS — I69398 Other sequelae of cerebral infarction: Secondary | ICD-10-CM | POA: Diagnosis not present

## 2021-08-13 DIAGNOSIS — I420 Dilated cardiomyopathy: Secondary | ICD-10-CM | POA: Diagnosis not present

## 2021-08-13 DIAGNOSIS — I69351 Hemiplegia and hemiparesis following cerebral infarction affecting right dominant side: Secondary | ICD-10-CM | POA: Diagnosis not present

## 2021-08-16 DIAGNOSIS — I69398 Other sequelae of cerebral infarction: Secondary | ICD-10-CM | POA: Diagnosis not present

## 2021-08-16 DIAGNOSIS — I255 Ischemic cardiomyopathy: Secondary | ICD-10-CM | POA: Diagnosis not present

## 2021-08-16 DIAGNOSIS — I5022 Chronic systolic (congestive) heart failure: Secondary | ICD-10-CM | POA: Diagnosis not present

## 2021-08-16 DIAGNOSIS — H547 Unspecified visual loss: Secondary | ICD-10-CM | POA: Diagnosis not present

## 2021-08-16 DIAGNOSIS — I272 Pulmonary hypertension, unspecified: Secondary | ICD-10-CM | POA: Diagnosis not present

## 2021-08-16 DIAGNOSIS — I110 Hypertensive heart disease with heart failure: Secondary | ICD-10-CM | POA: Diagnosis not present

## 2021-08-16 DIAGNOSIS — I69320 Aphasia following cerebral infarction: Secondary | ICD-10-CM | POA: Diagnosis not present

## 2021-08-16 DIAGNOSIS — I69351 Hemiplegia and hemiparesis following cerebral infarction affecting right dominant side: Secondary | ICD-10-CM | POA: Diagnosis not present

## 2021-08-16 DIAGNOSIS — I420 Dilated cardiomyopathy: Secondary | ICD-10-CM | POA: Diagnosis not present

## 2021-08-20 DIAGNOSIS — H547 Unspecified visual loss: Secondary | ICD-10-CM | POA: Diagnosis not present

## 2021-08-20 DIAGNOSIS — I255 Ischemic cardiomyopathy: Secondary | ICD-10-CM | POA: Diagnosis not present

## 2021-08-20 DIAGNOSIS — I69320 Aphasia following cerebral infarction: Secondary | ICD-10-CM | POA: Diagnosis not present

## 2021-08-20 DIAGNOSIS — I5022 Chronic systolic (congestive) heart failure: Secondary | ICD-10-CM | POA: Diagnosis not present

## 2021-08-20 DIAGNOSIS — I69351 Hemiplegia and hemiparesis following cerebral infarction affecting right dominant side: Secondary | ICD-10-CM | POA: Diagnosis not present

## 2021-08-20 DIAGNOSIS — I420 Dilated cardiomyopathy: Secondary | ICD-10-CM | POA: Diagnosis not present

## 2021-08-20 DIAGNOSIS — I69398 Other sequelae of cerebral infarction: Secondary | ICD-10-CM | POA: Diagnosis not present

## 2021-08-20 DIAGNOSIS — I110 Hypertensive heart disease with heart failure: Secondary | ICD-10-CM | POA: Diagnosis not present

## 2021-08-20 DIAGNOSIS — I272 Pulmonary hypertension, unspecified: Secondary | ICD-10-CM | POA: Diagnosis not present

## 2021-08-21 ENCOUNTER — Other Ambulatory Visit: Payer: Self-pay

## 2021-08-21 NOTE — Patient Instructions (Signed)
Goals Addressed               This Visit's Progress     Keep or Improve My Strength-Stroke (pt-stated)   On track     Barriers: Knowledge Timeframe:  Short-Term Goal Priority:  High Start Date:      07/23/21                       Expected End Date:      10/10/21                Follow Up Date 10/10/21  - attend 90 percent of physical therapy appointments - eat healthy to increase strength - know who to call for help if I fall  [facility] invloved Why is this important?   Before the stroke you probably did not think much about being safe when you are up and about.  Now, it may be harder for you to get around.  It may also be easier for you to trip or fall.  It is common to have muscle weakness after a stroke. You may also feel like you cannot control an arm or leg.  It will be helpful to work with a physical therapist to get your strength and muscle control back.  It is good to stay as active as you can. Walking and stretching help you stay strong and flexible.  The physical therapist will develop an exercise program just for you.     Notes: 07/23/21 Patient doing well from stroke per wife. Patient has some weakness.  She reports therapy has been ordered([facility]).  Appointment with PCP set for 07/27/21 Wife to transport.   Stroke Symptoms Sudden numbness or weakness in the face, arm, or leg, especially on one side of the body. Sudden confusion, trouble speaking, or difficulty understanding speech. Sudden trouble seeing in one or both eyes. Sudden trouble walking, dizziness, loss of balance, or lack of coordination. Sudden severe headache with no known cause. Call 9-1-1 right away if you or someone else has any of these symptoms. During a stroke, every minute counts! Fast treatment Fast treatment can lessen the brain damage that stroke can cause. By knowing the signs and symptoms of stroke, you can take quick action and perhaps save a life. 08/08/21 Patient continues to work  with therapy. He voices some frustration with word finding.  Encouraged patient to take his time when expressing himself and continue work with therapy.  No concerns.   08/21/21 Patient continues to work with therapy for strengthening.  Reiterated stroke symptoms. No concerns.        Track and Manage My Blood Pressure-Hypertension (pt-stated)   On track     Barriers: Health Behaviors Timeframe:  Long-Range Goal Priority:  High Start Date:     07/23/21                        Expected End Date:   05/10/22                    Follow Up Date 10/10/21   - check blood pressure daily - write blood pressure results in a log or diary    Why is this important?   You won't feel high blood pressure, but it can still hurt your blood vessels.  High blood pressure can cause heart or kidney problems. It can also cause a stroke.  Making lifestyle changes like losing a little  weight or eating less salt will help.  Checking your blood pressure at home and at different times of the day can help to control blood pressure.  If the doctor prescribes medicine remember to take it the way the doctor ordered.  Call the office if you cannot afford the medicine or if there are questions about it.     Notes: 07/23/21 Not checking blood pressure since being at home. Encouraged blood pressure monitoring.  Wife states they normally check daily.   Hypertension Management Discussed Take medications as ordered Limit salt intake. Eat plenty of fruits and vegetables Remain as active as possible Take blood pressure at least weekly at home and record in a notebook to take to doctor's visits.  08/08/21 Patient blood pressure last report was 120/74. Patient taking medications as prescribed.  Discussed diet as management of hypertension as well.   08/21/21 Patient blood pressure highest was 134/80.   Discussed medication adherence and heart healthy low salt diet.  No concerns.

## 2021-08-21 NOTE — Patient Outreach (Signed)
[place] [facility] : [facility]) Care Management  [place]  08/21/2021   Ryan Savage 1929-04-13 [id number]  Subjective: Telephone call for follow up. Patient doing ok.  Still some vision loss.  Blood pressure control continues. No concerns.    Objective:   Encounter Medications:  Outpatient Encounter Medications as of 08/21/2021  Medication Sig   acetaminophen (TYLENOL) 325 MG tablet Take 1-2 tablets (325-650 mg total) by mouth every 4 (four) hours as needed for mild pain.   amLODipine (NORVASC) 2.5 MG tablet Take 1 tablet (2.5 mg total) by mouth every evening.   ascorbic acid (VITAMIN C) 500 MG tablet Take 500 mg by mouth daily.   aspirin EC 81 MG tablet Take 1 tablet (81 mg total) by mouth daily. Swallow whole.   Cholecalciferol (VITAMIN D3) 50 MCG (2000 UT) TABS Take 2,000 Units by mouth every other day.   digoxin (LANOXIN) 0.125 MG tablet Take 1 tablet (0.125 mg total) by mouth daily.   finasteride (PROSCAR) 5 MG tablet Take 1 tablet (5 mg total) by mouth daily.   Melatonin 3 MG TABS Take 3 mg by mouth at bedtime.   metoprolol succinate (TOPROL-XL) 25 MG 24 hr tablet Take 1 tablet (25 mg total) by mouth daily.   Multiple Vitamin (MULTIVITAMIN WITH MINERALS) TABS tablet Take 1 tablet by mouth daily.   simvastatin (ZOCOR) 10 MG tablet Take 1 tablet (10 mg total) by mouth every evening.   zinc gluconate 50 MG tablet Take 50 mg by mouth daily.   No facility-administered encounter medications on file as of 08/21/2021.    Functional Status:  In your present state of health, do you have any difficulty performing the following activities: 07/23/2021 07/20/2021  Hearing? N -  Vision? N -  Difficulty concentrating or making decisions? N -  Walking or climbing stairs? N -  Dressing or bathing? N -  Doing errands, shopping? N N  Preparing Food and eating ? N -  Using the Toilet? N -  In the past six months, have you accidently leaked urine? N -  Do you have problems with  loss of bowel control? N -  Managing your Medications? N -  Managing your Finances? N -  Housekeeping or managing your Housekeeping? Y -  Comment wife helps -  Some recent data might be hidden    Fall/Depression Screening: Fall Risk  07/31/2021 04/23/2018 03/12/2018  Falls in the past year? 0 No No   PHQ 2/9 Scores 07/31/2021 07/23/2021 03/12/2018  PHQ - 2 Score 0 - 0  PHQ- 9 Score - - 9  Exception Documentation - Other- indicate reason in comment box -  Not completed - wife completes assessment -    Assessment:   Care Plan Care Plan : General Plan of Care (Adult)  Updates made by Jon Billings, RN since 08/21/2021 12:00 AM     Problem: Therapeutic Alliance (General Plan of Care)      Goal: Therapeutic Alliance Established as evidenced by patient engaging with care manager Completed 08/21/2021  Start Date: 07/23/2021  Expected End Date: 09/10/2021  This Visit's Progress: On track  Recent Progress: On track  Priority: High  Note:   Evidence-based guidance:  Avoid value judgments; convey acceptance.  Encourage collaboration with the treatment team.  Establish rapport; develop trust relationship.  Therapist, art.  Provide emotional support; encourage patient to share feelings of anger, fear and anxiety.  Promote self-reliance and autonomy based on age and ability; discourage overprotection.  Use empathy and  nonjudgmental, participatory manner.   Notes:     Task: Develop Relationship to Effect Behavior Change Completed 08/21/2021  Due Date: 09/10/2021  Priority: Routine  Responsible User: Jon Billings, RN  Note:   Care Management Activities:    - acceptance conveyed - collaboration with team encouraged - questions encouraged - reassurance provided    Notes: 07/23/21 Spoke with wife Enid D[facility])  She is agreeable to care management follow up for education and support post.  08/08/21 Patient reports he is doing ok and still agreeable to care management for support  and education.      Care Plan : Hypertension (Adult)  Updates made by Jon Billings, RN since 08/21/2021 12:00 AM     Problem: Hypertension (Hypertension)      Long-Range Goal: Hypertension Monitored as evidenced by blood pressure less than 140/80   Start Date: 07/23/2021  Expected End Date: 05/10/2022  This Visit's Progress: On track  Recent Progress: On track  Priority: High  Note:   Evidence-based guidance:  Promote initial use of ambulatory blood pressure measurements (for 3 days) to rule out "white-coat" effect; identify masked hypertension and presence or absence of nocturnal "dipping" of blood pressure.   Encourage continued use of home blood pressure monitoring and recording in blood pressure log; include symptoms of hypotension or potential medication side effects in log.  Review blood pressure measurements taken inside and outside of the provider office; establish baseline and monitor trends; compare to target ranges or patient goal.  Share overall cardiovascular risk with patient; encourage changes to lifestyle risk factors, including alcohol consumption, smoking, inadequate exercise, poor dietary habits and stress.   Notes:     Task: Identify and Monitor Blood Pressure Elevation   Due Date: 05/10/2022  Priority: Routine  Responsible User: Jon Billings, RN  Note:   Care Management Activities:    - home or ambulatory blood pressure monitoring encouraged    Notes: 07/23/21 Wife reports not checking blood pressure since being at home but states will be checking daily.   Hypertension Management Discussed Take medications as ordered Limit salt intake. Eat plenty of fruits and vegetables Remain as active as possible Take blood pressure at least weekly at home and record in a notebook to take to doctor's visits.  08/08/21 Patient blood pressure last check was 120/74.  Discussed low salt diet. No concerns. 08/21/21 Patient blood pressure doing well.  134/80 highest blood  pressure.  Discussed continuing low salt heart healthy diet.       Care Plan : Stroke (Adult)  Updates made by Jon Billings, RN since 08/21/2021 12:00 AM     Problem: Residual Deficits (Stroke)      Goal: Residual Deficits Prevented or Minimized as eviendenced by patient reports of being stronger.   Start Date: 07/23/2021  Expected End Date: 10/10/2021  This Visit's Progress: On track  Recent Progress: On track  Priority: High  Note:   Evidence-based guidance:  Evaluate changes in function that determine patient's rehabilitation plan, including pain, vision, basic and instrumental activities of daily living, motor control, upper and lower motor function, cognition and emotions.  Prepare patient for long-term (at least 6 months, 5 days per week) interprofessional rehabilitation based on tolerance and degree of functional limitation, beginning soon after stroke event.  Refer to rehabilitation therapy for assessment and individualized program that may include swallowing, functional task training, exercise, assistive device training, enhancement of self-care ability and cardiorespiratory fitness.  Prepare patient for referral to speech language pathologist to assess  and treat speech/language and swallowing deficits.  Note: Patient with persistent weight loss or recurrent infections should be urgently assessed and treated.  Promote optimal independence and self-efficacy.  Monitor and manage effects of pharmacologic therapy that may include serotonin selective reuptake inhibitor, anticholinergic, vitamin supplement, eugeroic (wakefulness-promoting agent) or bisphosphonate.  Screen and assess risk for malnutrition, micronutrient deficiency and dehydration; address tolerance to diet and assess adequacy of fluid intake.  If not able to meet nutrition or fluid requirements, consider recommendations of alternate route for nutrition, hydration and medication while considering quality of life and  patient preferences.  Assess and address poststroke fatigue; consider regular exercise, sleep-hygiene practices, avoidance of sedating drugs and excessive alcohol, mindfulness, relaxation and pharmacologic therapy.   Promote regular exercise and physical activity focused on improving strength and cardio-respiratory function based on ability and tolerance.  Encourage appropriate vocational or educational counseling for reentering the community, workplace or school, including a driving evaluation.   Notes:     Task: Optimize Functional Ability   Due Date: 10/10/2021  Priority: Routine  Responsible User: Jon Billings, RN  Note:   Care Management Activities:    - balance, gait and fall risk reviewed - knowledge of how to summon emergency services ensured    Notes: 07/23/21 Spoke with wife DPR. She states that patient is getting along well. Patient able to bath and dress self. She is just able to stand by if needed.  Patient has home health ordered for therapy. Main goal is to gain strength.  Wife does report some visual problems since stroke and will be following up with eye doctor.  Discussed stroke symptoms. Patient has follow up with PCP on 07-27-21  Wife takes to appointments.  Stroke Symptoms Sudden numbness or weakness in the face, arm, or leg, especially on one side of the body. Sudden confusion, trouble speaking, or difficulty understanding speech. Sudden trouble seeing in one or both eyes. Sudden trouble walking, dizziness, loss of balance, or lack of coordination. Sudden severe headache with no known cause. Call 9-1-1 right away if you or someone else has any of these symptoms. During a stroke, every minute counts! Fast treatment Fast treatment can lessen the brain damage that stroke can cause. By knowing the signs and symptoms of stroke, you can take quick action and perhaps save a life. 08/08/21 Patient reports doing ok.  Patient works with therapy for strengthening and speech  therapy for help with word finding.  Discussed stroke management.   08/21/21 Patient continues to work with therapy. Continues with vision loss.  Appt. Next month with eye doctor.        Goals Addressed               This Visit's Progress     Keep or Improve My Strength-Stroke (pt-stated)   On track     Barriers: Knowledge Timeframe:  Short-Term Goal Priority:  High Start Date:      07/23/21                       Expected End Date:      10/10/21                Follow Up Date 10/10/21  - attend 90 percent of physical therapy appointments - eat healthy to increase strength - know who to call for help if I fall  [facility] invloved Why is this important?   Before the stroke you probably did not think much  about being safe when you are up and about.  Now, it may be harder for you to get around.  It may also be easier for you to trip or fall.  It is common to have muscle weakness after a stroke. You may also feel like you cannot control an arm or leg.  It will be helpful to work with a physical therapist to get your strength and muscle control back.  It is good to stay as active as you can. Walking and stretching help you stay strong and flexible.  The physical therapist will develop an exercise program just for you.     Notes: 07/23/21 Patient doing well from stroke per wife. Patient has some weakness.  She reports therapy has been ordered([facility]).  Appointment with PCP set for 07/27/21 Wife to transport.   Stroke Symptoms Sudden numbness or weakness in the face, arm, or leg, especially on one side of the body. Sudden confusion, trouble speaking, or difficulty understanding speech. Sudden trouble seeing in one or both eyes. Sudden trouble walking, dizziness, loss of balance, or lack of coordination. Sudden severe headache with no known cause. Call 9-1-1 right away if you or someone else has any of these symptoms. During a stroke, every minute counts! Fast treatment Fast  treatment can lessen the brain damage that stroke can cause. By knowing the signs and symptoms of stroke, you can take quick action and perhaps save a life. 08/08/21 Patient continues to work with therapy. He voices some frustration with word finding.  Encouraged patient to take his time when expressing himself and continue work with therapy.  No concerns.   08/21/21 Patient continues to work with therapy for strengthening.  Reiterated stroke symptoms. No concerns.        Track and Manage My Blood Pressure-Hypertension (pt-stated)   On track     Barriers: Health Behaviors Timeframe:  Long-Range Goal Priority:  High Start Date:     07/23/21                        Expected End Date:   05/10/22                    Follow Up Date 10/10/21   - check blood pressure daily - write blood pressure results in a log or diary    Why is this important?   You won't feel high blood pressure, but it can still hurt your blood vessels.  High blood pressure can cause heart or kidney problems. It can also cause a stroke.  Making lifestyle changes like losing a little weight or eating less salt will help.  Checking your blood pressure at home and at different times of the day can help to control blood pressure.  If the doctor prescribes medicine remember to take it the way the doctor ordered.  Call the office if you cannot afford the medicine or if there are questions about it.     Notes: 07/23/21 Not checking blood pressure since being at home. Encouraged blood pressure monitoring.  Wife states they normally check daily.   Hypertension Management Discussed Take medications as ordered Limit salt intake. Eat plenty of fruits and vegetables Remain as active as possible Take blood pressure at least weekly at home and record in a notebook to take to doctor's visits.  08/08/21 Patient blood pressure last report was 120/74. Patient taking medications as prescribed.  Discussed diet as management of hypertension as  well.  08/21/21 Patient blood pressure highest was 134/80.   Discussed medication adherence and heart healthy low salt diet.  No concerns.          Plan:  Follow-up: Patient agrees to Care Plan and Follow-up. Follow-up in 4-6 week(s)   Jone Baseman, RN, MSN [facility] Care Management Coordinator Direct Line [phone] Cell [phone] Toll Free: [phone]  Fax: [phone]

## 2021-08-27 DIAGNOSIS — I255 Ischemic cardiomyopathy: Secondary | ICD-10-CM | POA: Diagnosis not present

## 2021-08-27 DIAGNOSIS — I272 Pulmonary hypertension, unspecified: Secondary | ICD-10-CM | POA: Diagnosis not present

## 2021-08-27 DIAGNOSIS — I420 Dilated cardiomyopathy: Secondary | ICD-10-CM | POA: Diagnosis not present

## 2021-08-27 DIAGNOSIS — I69320 Aphasia following cerebral infarction: Secondary | ICD-10-CM | POA: Diagnosis not present

## 2021-08-27 DIAGNOSIS — I110 Hypertensive heart disease with heart failure: Secondary | ICD-10-CM | POA: Diagnosis not present

## 2021-08-27 DIAGNOSIS — I69351 Hemiplegia and hemiparesis following cerebral infarction affecting right dominant side: Secondary | ICD-10-CM | POA: Diagnosis not present

## 2021-08-27 DIAGNOSIS — H547 Unspecified visual loss: Secondary | ICD-10-CM | POA: Diagnosis not present

## 2021-08-27 DIAGNOSIS — I69398 Other sequelae of cerebral infarction: Secondary | ICD-10-CM | POA: Diagnosis not present

## 2021-08-27 DIAGNOSIS — I5022 Chronic systolic (congestive) heart failure: Secondary | ICD-10-CM | POA: Diagnosis not present

## 2021-09-03 DIAGNOSIS — I110 Hypertensive heart disease with heart failure: Secondary | ICD-10-CM | POA: Diagnosis not present

## 2021-09-03 DIAGNOSIS — I420 Dilated cardiomyopathy: Secondary | ICD-10-CM | POA: Diagnosis not present

## 2021-09-03 DIAGNOSIS — I272 Pulmonary hypertension, unspecified: Secondary | ICD-10-CM | POA: Diagnosis not present

## 2021-09-03 DIAGNOSIS — H547 Unspecified visual loss: Secondary | ICD-10-CM | POA: Diagnosis not present

## 2021-09-03 DIAGNOSIS — I255 Ischemic cardiomyopathy: Secondary | ICD-10-CM | POA: Diagnosis not present

## 2021-09-03 DIAGNOSIS — I69320 Aphasia following cerebral infarction: Secondary | ICD-10-CM | POA: Diagnosis not present

## 2021-09-03 DIAGNOSIS — I69398 Other sequelae of cerebral infarction: Secondary | ICD-10-CM | POA: Diagnosis not present

## 2021-09-03 DIAGNOSIS — I69351 Hemiplegia and hemiparesis following cerebral infarction affecting right dominant side: Secondary | ICD-10-CM | POA: Diagnosis not present

## 2021-09-03 DIAGNOSIS — I5022 Chronic systolic (congestive) heart failure: Secondary | ICD-10-CM | POA: Diagnosis not present

## 2021-09-04 DIAGNOSIS — G4733 Obstructive sleep apnea (adult) (pediatric): Secondary | ICD-10-CM | POA: Diagnosis not present

## 2021-09-05 ENCOUNTER — Ambulatory Visit: Payer: Medicare PPO | Admitting: Adult Health

## 2021-09-05 ENCOUNTER — Encounter: Payer: Self-pay | Admitting: Adult Health

## 2021-09-05 ENCOUNTER — Other Ambulatory Visit: Payer: Self-pay

## 2021-09-05 VITALS — BP 128/74 | HR 42 | Ht 71.0 in | Wt 165.0 lb

## 2021-09-05 DIAGNOSIS — I63432 Cerebral infarction due to embolism of left posterior cerebral artery: Secondary | ICD-10-CM | POA: Diagnosis not present

## 2021-09-05 DIAGNOSIS — H53461 Homonymous bilateral field defects, right side: Secondary | ICD-10-CM | POA: Diagnosis not present

## 2021-09-05 NOTE — Patient Instructions (Addendum)
Continue aspirin 81 mg daily  and simvastatin for secondary stroke prevention  Continue routine follow-up with cardiology as scheduled  Follow up with ophthalmologist next week - request peripheral visual field test be completed and to fax to our office once completed   Continue to follow up with PCP regarding cholesterol and blood pressure management  Maintain strict control of hypertension with blood pressure goal below 130/90 and cholesterol with LDL cholesterol (bad cholesterol) goal below 70 mg/dL.       Followup in the future with me in 6 months or call earlier if needed      Thank you for coming to see [place] at [facility]. I hope we have been able to provide you high quality care today.  You may receive a patient satisfaction survey over the next few weeks. We would appreciate your feedback and comments so that we may continue to improve ourselves and the health of our patients.

## 2021-09-05 NOTE — Progress Notes (Signed)
[facility] [address] [place] [address] [phone]       HOSPITAL FOLLOW UP NOTE  [name] Date of Birth:  02-05-1929 Medical Record Number:  [id number]   Reason for Referral:  hospital stroke follow up    SUBJECTIVE:   CHIEF COMPLAINT:  Chief Complaint  Patient presents with   Follow-up    Rm 2  with spouse shirley  Pt is well,  has lost R sided vision more so his peripheral vision as well as finding a hard time finding words.     HPI:   [name] is a 87 y.o. male with history of A. fib not on AC, [name], CHF, OSA on CPAP, hypertension, hyperlipidemia, history of stroke, history of SDH and ICH who presented 07/17/2021 with right hemianopia and word finding difficulty.  Stroke work-up revealed left large PCA infarct, embolic secondary to chronic A. fib not on AC due to hx of SDH and ICH.  Recommend follow-up with Dr. [name] for potential watchman device and started on aspirin 81 mg daily (not compliant at home).  LDL 45 -resumed simvastatin 10 mg daily. Hx of SDH x3 s/p bilateral evacuation 2019, ICH right cerebellum 01/2018 and left MCA punctate infarct 02/2021.  Therapy eval's recommended home health PT.   Today, 09/05/2021, [name] is being seen for initial hospital follow-up accompanied by his wife, Ryan Savage.  Since discharge, reports continued visual impairment in periphery bilaterally with some possible improvement and word finding difficulty which has been improving. Has an appt with ophthalmology next week.  Recently completed HH therapies. Use of a cane for ambulation outdoors - does not use in the house. Was not using cane prior. He will occasionally bump into counters, doors or walls on right side. Denies new stroke/ TIA symptoms.  Compliant on aspirin and simvastatin without side effects.  Blood pressure today 128/24. Intermittent use of CPAP due to difficulty tolerating.  Eval by Dr. [name] for watchman device - pt  declined any further procedures at this time - plans on further discussing at follow-up visit with [name].  No further concerns at this time.       ROS:   14 system review of systems performed and negative with exception of those listed in HPI  PMH:  Past Medical History:  Diagnosis Date   Aortic stenosis 05/22/2018   Mild with mean AVG 35mmHg by echo 05/2018   Atrial fibrillation, chronic (HCC)    Not on anticoagulation due to history of bilateral subdural bleeds due to recurrent falls   BPH (benign prostatic hypertrophy)    [name] (coronary artery disease), native coronary artery    Severe two-vessel [name] with chronically occluded LAD and RCA with widely patent left main, intermediate, and left circumflex branches.  On medical management.  He has extensive collaterals.   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 fib with RVR.  Repeat echo 05/2018 with EF 45-50%.   [name] (degenerative joint disease)    GERD (gastroesophageal reflux disease)    Hyperlipidemia    LDL goal less then 100   Hypertension    Microscopic hematuria    [name]   Mitral valve prolapse    with mild MR   OSA (obstructive sleep apnea)    Pulmonary HTN ([place])    PASP 22mmHg by echo 05/2018 but  normal pressures on right heart cath.   SDH (subdural hematoma) 01/2018   SIADH (syndrome of inappropriate ADH production) ([place])    Skin cancer    "side of my nose" (11/19/2017)   Stroke ([place]) 12/2017   "fully recovered"   Thrombocytopenia ([place])    Mild- platelet count 143,000 on 02/2011, stable 08/2011    PSH:  Past Surgical History:  Procedure Laterality Date   APPENDECTOMY  1950   BACK SURGERY     BURR HOLE Bilateral 01/25/2018   Procedure: Haskell Flirt;  Surgeon: Kary Kos, MD;  Location: [facility];  Service: Neurosurgery;  Laterality: Bilateral;   BURR HOLE Left 02/20/2018    Procedure: BURR HOLES for subdural hematoma;  Surgeon: Newman Pies, MD;  Location: [facility];  Service: Neurosurgery;  Laterality: Left;   BURR HOLE Left 03/20/2018   Procedure: Craniotomy for subdural hematoma;  Surgeon: Kristeen Miss, MD;  Location: [facility];  Service: Neurosurgery;  Laterality: Left;   FOREARM FRACTURE SURGERY Right [place]   ruptured disc repair   RIGHT/LEFT HEART CATH AND CORONARY ANGIOGRAPHY N/A 11/21/2017   Procedure: RIGHT/LEFT HEART CATH AND CORONARY ANGIOGRAPHY;  Surgeon: Sherren Mocha, MD;  Location: [place] [place];  Service: Cardiovascular;  Laterality: N/A;   SKIN CANCER EXCISION Left    "side of my nose"    Social History:  Social History   Socioeconomic History   Marital status: Married    Spouse name: Not on file   Number of children: Not on file   Years of education: Not on file   Highest education level: Not on file  Occupational History   Occupation: retired  Tobacco Use   Smoking status: Former    Years: 3.00    Types: Cigarettes    Quit date: 1958    Years since quitting: 64.8   Smokeless tobacco: Never  Vaping Use   Vaping Use: Never used  Substance and Sexual Activity   Alcohol use: No   Drug use: No   Sexual activity: Not on file  Other Topics Concern   Not on file  Social History Narrative   Not on file   Social Determinants of Health   Financial Resource Strain: Not on file  Food Insecurity: Not on file  Transportation Needs: No Transportation Needs   Lack of Transportation (Medical): No   Lack of Transportation (Non-Medical): No  Physical Activity: Not on file  Stress: Not on file  Social Connections: Not on file  Intimate Partner Violence: Not on file    Family History:  Family History  Problem Relation Age of Onset   Heart failure Mother    Heart attack Father    [name] Father    Heart attack Brother    [name] Brother    Colon cancer Brother    Colon cancer Sister     Medications:    Current Outpatient Medications on File Prior to Visit  Medication Sig Dispense Refill   acetaminophen (TYLENOL) 325 MG tablet Take 1-2 tablets (325-650 mg total) by mouth every 4 (four) hours as needed for mild pain.     amLODipine (NORVASC) 2.5 MG tablet Take 1 tablet (2.5 mg total) by mouth every evening. 90 tablet 3   ascorbic acid (VITAMIN C) 500 MG tablet Take 500 mg by mouth daily.     aspirin EC 81 MG tablet Take 1 tablet (81 mg total) by mouth daily. Swallow whole. 150 tablet 2   Cholecalciferol (VITAMIN  D3) 50 MCG (2000 UT) TABS Take 2,000 Units by mouth every other day.     digoxin (LANOXIN) 0.125 MG tablet Take 1 tablet (0.125 mg total) by mouth daily. 90 tablet 3   finasteride (PROSCAR) 5 MG tablet Take 1 tablet (5 mg total) by mouth daily. 30 tablet 0   Melatonin 3 MG TABS Take 3 mg by mouth at bedtime.     metoprolol succinate (TOPROL-XL) 25 MG 24 hr tablet Take 1 tablet (25 mg total) by mouth daily. 90 tablet 3   Multiple Vitamin (MULTIVITAMIN WITH MINERALS) TABS tablet Take 1 tablet by mouth daily.     simvastatin (ZOCOR) 10 MG tablet Take 1 tablet (10 mg total) by mouth every evening. 90 tablet 3   zinc gluconate 50 MG tablet Take 50 mg by mouth daily.     No current facility-administered medications on file prior to visit.    Allergies:   Allergies  Allergen Reactions   Methylphenidate Derivatives Other (See Comments)    Restless leg   Warfarin Sodium     Other reaction(s): subdural   Novocain [Procaine] Rash      OBJECTIVE:  Physical Exam  Vitals:   09/05/21 1310  BP: 128/74  Pulse: (!) 42  Weight: 165 lb (74.8 kg)  Height: 5\' 11"  (1.803 m)   Body mass index is 23.01 kg/m. No results found.   General: well developed, well nourished, very pleasant elderly Caucasian male, seated, in no evident distress Head: head normocephalic and atraumatic.   Neck: supple with no carotid or supraclavicular bruits Cardiovascular: regular rate and rhythm, no  murmurs Musculoskeletal: no deformity Skin:  no rash/petichiae Vascular:  Normal pulses all extremities   Neurologic Exam Mental Status: Awake and fully alert.  Occasional word finding difficulty.  No evidence of dysarthria.  Oriented to place and time. Recent and remote memory intact. Attention span, concentration and fund of knowledge appropriate. Mood and affect appropriate.  Cranial Nerves: Fundoscopic exam reveals sharp disc margins. Pupils equal, briskly reactive to light. Extraocular movements full without nystagmus. Visual fields right homonymous hemianopia (superior> inferior).  HOH bilaterally. Facial sensation intact. Face, tongue, palate moves normally and symmetrically.  Motor: Normal bulk and tone. Normal strength in all tested extremity muscles Sensory.: intact to touch , pinprick , position and vibratory sensation.  Coordination: Rapid alternating movements normal in all extremities. Finger-to-nose and heel-to-shin performed accurately bilaterally. Gait and Station: Arises from chair without difficulty. Stance is normal. Gait demonstrates normal stride length and mild imbalance with use of cane. Tandem walk and heel toe not attempted.  Reflexes: 1+ and symmetric. Toes downgoing.     NIHSS  1 Modified Rankin  2      ASSESSMENT: Ryan Savage is a 87 y.o. year old male with a left large PCA infarct, embolic likely secondary to chronic A. fib not on AC on 07/17/2021. Vascular risk factors include left MCA stroke 02/2021, history of SDH and ICH 2019, chronic A. fib not on AC due to prior SDH and ICH, HTN, HLD, advanced age, [name], OSA and CHF.      PLAN:  Left PCA stroke:  Residual deficit: Right homonymous hemianopia. Scheduled ophthalmology evaluation next week Continue aspirin 81 mg daily  and simvastatin for secondary stroke prevention.  Discussed secondary stroke prevention measures and importance of close PCP follow up for aggressive stroke risk factor management. I  have gone over the pathophysiology of stroke, warning signs and symptoms, risk factors and their management in some detail  with instructions to go to the closest emergency room for symptoms of concern. Atrial fibrillation: Not AC candidate.  Declined watchman device.  Followed by [name]. HTN: BP goal <130/90.  Managed by PCP HLD: LDL goal <70.  Continue simvastatin per PCP OSA on CPAP: Noncompliant due to difficulty tolerating.  Discussed ways to help improve tolerance.  Educated importance of nightly usage    Follow up in 6 months or call earlier if needed   CC:  [facility] provider: Dr. Leonie Man PCP: Lajean Manes, MD    I spent 57 minutes of face-to-face and non-face-to-face time with patient and wife.  This included previsit chart review including review of recent hospitalization, lab review, study review, electronic health record documentation, patient and wife education regarding recent stroke including etiology, secondary stroke prevention measures and importance of managing stroke risk factors, residual deficits and typical recovery time and answered all other questions to patient satisfaction  Frann Rider, AGNP-BC  [facility] [address] [place], [place] [address]  Phone [phone] Fax [phone] Note: This document was prepared with digital dictation and possible smart phrase technology. Any transcriptional errors that result from this process are unintentional.

## 2021-09-06 ENCOUNTER — Encounter: Payer: Self-pay | Admitting: Adult Health

## 2021-09-13 DIAGNOSIS — H353132 Nonexudative age-related macular degeneration, bilateral, intermediate dry stage: Secondary | ICD-10-CM | POA: Diagnosis not present

## 2021-09-13 DIAGNOSIS — H43813 Vitreous degeneration, bilateral: Secondary | ICD-10-CM | POA: Diagnosis not present

## 2021-09-13 DIAGNOSIS — H35342 Macular cyst, hole, or pseudohole, left eye: Secondary | ICD-10-CM | POA: Diagnosis not present

## 2021-09-13 DIAGNOSIS — H53461 Homonymous bilateral field defects, right side: Secondary | ICD-10-CM | POA: Diagnosis not present

## 2021-09-13 DIAGNOSIS — Z8673 Personal history of transient ischemic attack (TIA), and cerebral infarction without residual deficits: Secondary | ICD-10-CM | POA: Diagnosis not present

## 2021-09-13 DIAGNOSIS — H2513 Age-related nuclear cataract, bilateral: Secondary | ICD-10-CM | POA: Diagnosis not present

## 2021-09-25 ENCOUNTER — Other Ambulatory Visit: Payer: Self-pay

## 2021-09-25 NOTE — Patient Outreach (Signed)
[facility]) Care Management  09/25/2021  Azure Ulysse 1929-01-13 [id number]   Telephone call to patient for follow up.   No answer.  HIPAA compliant voice message left.    Plan: If no return call, RN CM will attempt patient again in the month of December.    Jone Baseman, RN, MSN [place] Management Care Management Coordinator Direct Line [phone] Cell [phone] Toll Free: [phone]  Fax: [phone]

## 2021-10-01 DIAGNOSIS — I6930 Unspecified sequelae of cerebral infarction: Secondary | ICD-10-CM | POA: Diagnosis not present

## 2021-10-01 DIAGNOSIS — I4891 Unspecified atrial fibrillation: Secondary | ICD-10-CM | POA: Diagnosis not present

## 2021-10-01 DIAGNOSIS — I10 Essential (primary) hypertension: Secondary | ICD-10-CM | POA: Diagnosis not present

## 2021-10-17 ENCOUNTER — Other Ambulatory Visit: Payer: Self-pay

## 2021-10-17 NOTE — Patient Instructions (Signed)
Patient Goals/Self-Care Activities: Hypertension Take all medications as prescribed Call provider office for new concerns or questions  check blood pressure 3 times per week write blood pressure results in a log or diary take blood pressure log to all doctor appointments call doctor for signs and symptoms of high blood pressure take medications for blood pressure exactly as prescribed eat more whole grains, fruits and vegetables, lean meats and healthy fats Limit salt intake       Stay as active as possible

## 2021-10-17 NOTE — Patient Outreach (Signed)
[facility]) Care Management  [name]  10/17/2021   Ryan Savage [birth year] [id number]  Subjective: Telephone call to patient for follow up. He reports doing good.  He remains hopeful for improved vision.  He reports blood pressure remains less than 140/80.  Discussed HTN management and diet. No concerns.    Objective:   Encounter Medications:  Outpatient Encounter Medications as of 10/17/2021  Medication Sig   acetaminophen (TYLENOL) 325 MG tablet Take 1-2 tablets (325-650 mg total) by mouth every 4 (four) hours as needed for mild pain.   amLODipine (NORVASC) 2.5 MG tablet Take 1 tablet (2.5 mg total) by mouth every evening.   ascorbic acid (VITAMIN C) 500 MG tablet Take 500 mg by mouth daily.   aspirin EC 81 MG tablet Take 1 tablet (81 mg total) by mouth daily. Swallow whole.   Cholecalciferol (VITAMIN D3) 50 MCG (2000 UT) TABS Take 2,000 Units by mouth every other day.   digoxin (LANOXIN) 0.125 MG tablet Take 1 tablet (0.125 mg total) by mouth daily.   finasteride (PROSCAR) 5 MG tablet Take 1 tablet (5 mg total) by mouth daily.   Melatonin 3 MG TABS Take 3 mg by mouth at bedtime.   metoprolol succinate (TOPROL-XL) 25 MG 24 hr tablet Take 1 tablet (25 mg total) by mouth daily.   Multiple Vitamin (MULTIVITAMIN WITH MINERALS) TABS tablet Take 1 tablet by mouth daily.   simvastatin (ZOCOR) 10 MG tablet Take 1 tablet (10 mg total) by mouth every evening.   zinc gluconate 50 MG tablet Take 50 mg by mouth daily.   No facility-administered encounter medications on file as of 10/17/2021.    Functional Status:  In your present state of health, do you have any difficulty performing the following activities: 10/17/2021 07/23/2021  Hearing? N N  Vision? N N  Difficulty concentrating or making decisions? N N  Walking or climbing stairs? N N  Dressing or bathing? N N  Doing errands, shopping? N N  Preparing Food and eating ? N N  Using the Toilet? N N  In the  past six months, have you accidently leaked urine? - N  Do you have problems with loss of bowel control? - N  Managing your Medications? N N  Managing your Finances? N N  Housekeeping or managing your Housekeeping? (No Data) Y  Comment wife helps wife helps  Some recent data might be hidden    Fall/Depression Screening: Fall Risk  10/17/2021 07/31/2021 04/23/2018  Falls in the past year? 0 0 No   PHQ 2/9 Scores 10/17/2021 07/31/2021 07/23/2021 03/12/2018  PHQ - 2 Score 0 0 - 0  PHQ- 9 Score - - - 9  Exception Documentation - - Other- indicate reason in comment box -  Not completed - - wife completes assessment -    Assessment:   Care Plan Care Plan : [place] of Care  Updates made by Jon Billings, RN since 10/17/2021 12:00 AM     Problem: Chronic Disease Management for the management of HTN   Priority: High     Long-Range Goal: Develop plan of care related to management of HTN   Start Date: 10/17/2021  Expected End Date: 11/10/2022  Priority: High  Note:   Current Barriers:  Chronic Disease Management support and education needs related to HTN   RNCM Clinical Goal(s):  Patient will verbalize basic understanding of  HTN disease process and self health management plan as evidenced by blood pressure  reports less than 140/80  through collaboration with RN Care manager, provider, and care team.   Interventions: Education and support related to Hypertension Inter-disciplinary care team collaboration (see longitudinal plan of care) Evaluation of current treatment plan related to  self management and patient's adherence to plan as established by provider   Hypertension Interventions:  (Status:  New goal.) Long Term Goal Last practice recorded BP readings:  BP Readings from Last 3 Encounters:  09/05/21 128/74  08/06/21 120/74  07/20/21 131/81  Most recent eGFR/CrCl: No results found for: EGFR  No components found for: CRCL  Evaluation of current treatment plan  related to hypertension self management and patient's adherence to plan as established by provider Provided education to patient re: stroke prevention, s/s of heart attack and stroke Reviewed medications with patient and discussed importance of compliance Discussed plans with patient for ongoing care management follow up and provided patient with direct contact information for care management team Advised patient, providing education and rationale, to monitor blood pressure daily and record, calling PCP for findings outside established parameters Provided education on prescribed diet low salt diet Discussed complications of poorly controlled blood pressure such as heart disease, stroke, circulatory complications, vision complications, kidney impairment, sexual dysfunction  Patient Goals/Self-Care Activities: Hypertension Take all medications as prescribed Call provider office for new concerns or questions  check blood pressure 3 times per week write blood pressure results in a log or diary take blood pressure log to all doctor appointments call doctor for signs and symptoms of high blood pressure take medications for blood pressure exactly as prescribed eat more whole grains, fruits and vegetables, lean meats and healthy fats Limit salt intake       Stay as active as possible   Follow Up Plan:  Telephone follow up appointment with care management team member scheduled for:  January We have been unable to make contact with the patient for follow up. The care management team is available to follow up with the patient after provider conversation with the patient regarding recommendation for care management engagement and subsequent re-referral to the care management team.     Care Plan : Hypertension (Adult)  Updates made by Jon Billings, RN since 10/17/2021 12:00 AM  Completed 10/17/2021   Problem: Hypertension (Hypertension) Resolved 10/17/2021     Long-Range Goal: Hypertension  Monitored as evidenced by blood pressure less than 140/80 Completed 10/17/2021  Start Date: 07/23/2021  Expected End Date: 05/10/2022  Recent Progress: On track  Priority: High  Note:   Evidence-based guidance:  Promote initial use of ambulatory blood pressure measurements (for 3 days) to rule out "white-coat" effect; identify masked hypertension and presence or absence of nocturnal "dipping" of blood pressure.   Encourage continued use of home blood pressure monitoring and recording in blood pressure log; include symptoms of hypotension or potential medication side effects in log.  Review blood pressure measurements taken inside and outside of the provider office; establish baseline and monitor trends; compare to target ranges or patient goal.  Share overall cardiovascular risk with patient; encourage changes to lifestyle risk factors, including alcohol consumption, smoking, inadequate exercise, poor dietary habits and stress.   Notes:  26/8/34 Resolving duplicate goal    Task: Identify and Monitor Blood Pressure Elevation Completed 10/17/2021  Due Date: 05/10/2022  Priority: Routine  Responsible User: Jon Billings, RN  Note:   Care Management Activities:    - home or ambulatory blood pressure monitoring encouraged    Notes: 07/23/21 Wife reports not  checking blood pressure since being at home but states will be checking daily.   Hypertension Management Discussed Take medications as ordered Limit salt intake. Eat plenty of fruits and vegetables Remain as active as possible Take blood pressure at least weekly at home and record in a notebook to take to doctor's visits.  08/08/21 Patient blood pressure last check was 120/74.  Discussed low salt diet. No concerns. 08/21/21 Patient blood pressure doing well.  134/80 highest blood pressure.  Discussed continuing low salt heart healthy diet.       Care Plan : Stroke (Adult)  Updates made by Jon Billings, RN since 10/17/2021 12:00 AM   Completed 10/17/2021   Problem: Residual Deficits (Stroke) Resolved 10/17/2021     Goal: Residual Deficits Prevented or Minimized as eviendenced by patient reports of being stronger. Completed 10/17/2021  Start Date: 07/23/2021  Expected End Date: 10/10/2021  Recent Progress: On track  Priority: High  Note:   Evidence-based guidance:  Evaluate changes in function that determine patient's rehabilitation plan, including pain, vision, basic and instrumental activities of daily living, motor control, upper and lower motor function, cognition and emotions.  Prepare patient for long-term (at least 6 months, 5 days per week) interprofessional rehabilitation based on tolerance and degree of functional limitation, beginning soon after stroke event.  Refer to rehabilitation therapy for assessment and individualized program that may include swallowing, functional task training, exercise, assistive device training, enhancement of self-care ability and cardiorespiratory fitness.  Prepare patient for referral to speech language pathologist to assess and treat speech/language and swallowing deficits.  Note: Patient with persistent weight loss or recurrent infections should be urgently assessed and treated.  Promote optimal independence and self-efficacy.  Monitor and manage effects of pharmacologic therapy that may include serotonin selective reuptake inhibitor, anticholinergic, vitamin supplement, eugeroic (wakefulness-promoting agent) or bisphosphonate.  Screen and assess risk for malnutrition, micronutrient deficiency and dehydration; address tolerance to diet and assess adequacy of fluid intake.  If not able to meet nutrition or fluid requirements, consider recommendations of alternate route for nutrition, hydration and medication while considering quality of life and patient preferences.  Assess and address poststroke fatigue; consider regular exercise, sleep-hygiene practices, avoidance of sedating drugs  and excessive alcohol, mindfulness, relaxation and pharmacologic therapy.   Promote regular exercise and physical activity focused on improving strength and cardio-respiratory function based on ability and tolerance.  Encourage appropriate vocational or educational counseling for reentering the community, workplace or school, including a driving evaluation.   Notes:     Task: Optimize Functional Ability Completed 10/17/2021  Due Date: 10/10/2021  Priority: Routine  Responsible User: Jon Billings, RN  Note:   Care Management Activities:    - balance, gait and fall risk reviewed - knowledge of how to summon emergency services ensured    Notes: 07/23/21 Spoke with wife DPR. She states that patient is getting along well. Patient able to bath and dress self. She is just able to stand by if needed.  Patient has home health ordered for therapy. Main goal is to gain strength.  Wife does report some visual problems since stroke and will be following up with eye doctor.  Discussed stroke symptoms. Patient has follow up with PCP on 07-27-21  Wife takes to appointments.  Stroke Symptoms Sudden numbness or weakness in the face, arm, or leg, especially on one side of the body. Sudden confusion, trouble speaking, or difficulty understanding speech. Sudden trouble seeing in one or both eyes. Sudden trouble walking, dizziness, loss of  balance, or lack of coordination. Sudden severe headache with no known cause. Call 9-1-1 right away if you or someone else has any of these symptoms. During a stroke, every minute counts! Fast treatment Fast treatment can lessen the brain damage that stroke can cause. By knowing the signs and symptoms of stroke, you can take quick action and perhaps save a life. 08/08/21 Patient reports doing ok.  Patient works with therapy for strengthening and speech therapy for help with word finding.  Discussed stroke management.   08/21/21 Patient continues to work with therapy. Continues  with vision loss.  Appt. Next month with eye doctor.   10/17/21 Patient finished with therapy. Patient hopeful for improved vision.        Goals Addressed               This Visit's Progress     COMPLETED: Keep or Improve My Strength-Stroke (pt-stated)   On track      Barriers: Knowledge Timeframe:  Short-Term Goal Priority:  High Start Date:      07/23/21                       Expected End Date:      10/10/21                Follow Up Date 10/10/21  - attend 90 percent of physical therapy appointments - eat healthy to increase strength - know who to call for help if I fall  [facility] home [facility] invloved Why is this important?   Before the stroke you probably did not think much about being safe when you are up and about.  Now, it may be harder for you to get around.  It may also be easier for you to trip or fall.  It is common to have muscle weakness after a stroke. You may also feel like you cannot control an arm or leg.  It will be helpful to work with a physical therapist to get your strength and muscle control back.  It is good to stay as active as you can. Walking and stretching help you stay strong and flexible.  The physical therapist will develop an exercise program just for you.     Notes: 07/23/21 Patient doing well from stroke per wife. Patient has some weakness.  She reports therapy has been ordered([facility]).  Appointment with PCP set for 07/27/21 Wife to transport.   Stroke Symptoms Sudden numbness or weakness in the face, arm, or leg, especially on one side of the body. Sudden confusion, trouble speaking, or difficulty understanding speech. Sudden trouble seeing in one or both eyes. Sudden trouble walking, dizziness, loss of balance, or lack of coordination. Sudden severe headache with no known cause. Call 9-1-1 right away if you or someone else has any of these symptoms. During a stroke, every minute counts! Fast treatment Fast treatment can lessen the brain damage  that stroke can cause. By knowing the signs and symptoms of stroke, you can take quick action and perhaps save a life. 08/08/21 Patient continues to work with therapy. He voices some frustration with word finding.  Encouraged patient to take his time when expressing himself and continue work with therapy.  No concerns.   08/21/21 Patient continues to work with therapy for strengthening.  Reiterated stroke symptoms. No concerns.   10/17/21 Goal met patient finished with therpay      COMPLETED: Track and Manage My Blood Pressure-Hypertension (pt-stated)   On track  Barriers: Health Behaviors Timeframe:  Long-Range Goal Priority:  High Start Date:     07/23/21                        Expected End Date:   05/10/22                    Follow Up Date 10/10/21   - check blood pressure daily - write blood pressure results in a log or diary    Why is this important?   You won't feel high blood pressure, but it can still hurt your blood vessels.  High blood pressure can cause heart or kidney problems. It can also cause a stroke.  Making lifestyle changes like losing a little weight or eating less salt will help.  Checking your blood pressure at home and at different times of the day can help to control blood pressure.  If the doctor prescribes medicine remember to take it the way the doctor ordered.  Call the office if you cannot afford the medicine or if there are questions about it.     Notes: 07/23/21 Not checking blood pressure since being at home. Encouraged blood pressure monitoring.  Wife states they normally check daily.   Hypertension Management Discussed Take medications as ordered Limit salt intake. Eat plenty of fruits and vegetables Remain as active as possible Take blood pressure at least weekly at home and record in a notebook to take to doctor's visits.  08/08/21 Patient blood pressure last report was 120/74. Patient taking medications as prescribed.  Discussed diet as management  of hypertension as well.   08/21/21 Patient blood pressure highest was 134/80.   Discussed medication adherence and heart healthy low salt diet.  No concerns.   44/8/18  Resolving duplicate goal        Plan:  Follow-up: Patient agrees to Care Plan and Follow-up. Follow-up in 1 month(s)  Jone Baseman, RN, MSN [place] [facility] Care Management Coordinator Direct Line [phone] Cell [phone] Toll Free: [phone]  Fax: [phone]

## 2021-10-18 ENCOUNTER — Encounter: Payer: Self-pay | Admitting: Cardiology

## 2021-10-22 DIAGNOSIS — H43391 Other vitreous opacities, right eye: Secondary | ICD-10-CM | POA: Diagnosis not present

## 2021-10-22 DIAGNOSIS — I69398 Other sequelae of cerebral infarction: Secondary | ICD-10-CM | POA: Diagnosis not present

## 2021-10-22 DIAGNOSIS — H2513 Age-related nuclear cataract, bilateral: Secondary | ICD-10-CM | POA: Diagnosis not present

## 2021-10-22 DIAGNOSIS — H52203 Unspecified astigmatism, bilateral: Secondary | ICD-10-CM | POA: Diagnosis not present

## 2021-10-22 DIAGNOSIS — H5203 Hypermetropia, bilateral: Secondary | ICD-10-CM | POA: Diagnosis not present

## 2021-10-22 DIAGNOSIS — H524 Presbyopia: Secondary | ICD-10-CM | POA: Diagnosis not present

## 2021-10-26 ENCOUNTER — Ambulatory Visit: Payer: Medicare PPO | Admitting: Cardiology

## 2021-11-15 ENCOUNTER — Ambulatory Visit: Payer: Self-pay

## 2021-11-15 ENCOUNTER — Telehealth: Payer: Self-pay

## 2021-11-15 NOTE — Patient Outreach (Signed)
Attempted to call [name] to reschedule his appointment today with Jon Billings, RN.  After 5 attempts there was no answer and no voicemail to leave a message.  The appointment was cancelled.    [facility], [place], [place] [facility] [phone]

## 2021-12-24 DIAGNOSIS — I5022 Chronic systolic (congestive) heart failure: Secondary | ICD-10-CM | POA: Diagnosis not present

## 2021-12-24 DIAGNOSIS — I700 Atherosclerosis of aorta: Secondary | ICD-10-CM | POA: Diagnosis not present

## 2021-12-24 DIAGNOSIS — I10 Essential (primary) hypertension: Secondary | ICD-10-CM | POA: Diagnosis not present

## 2021-12-24 DIAGNOSIS — D692 Other nonthrombocytopenic purpura: Secondary | ICD-10-CM | POA: Diagnosis not present

## 2021-12-24 DIAGNOSIS — G629 Polyneuropathy, unspecified: Secondary | ICD-10-CM | POA: Diagnosis not present

## 2021-12-24 DIAGNOSIS — I4891 Unspecified atrial fibrillation: Secondary | ICD-10-CM | POA: Diagnosis not present

## 2022-01-10 ENCOUNTER — Other Ambulatory Visit: Payer: Self-pay

## 2022-01-10 NOTE — Patient Outreach (Signed)
[facility]) Care Management ? ?01/10/2022 ? ?Chun Grandville Silos ?1929/09/18 ?[id number] ? ? ?Spoke with spouse Enid Derry.  She reports that patient is doing good.  He walks everyday. Patient blood pressure control is good.  Discussed continued hypertension management. ? ?Care Plan : RN Care Manager Plan of Care  ?Updates made by Jon Billings, RN since 01/10/2022 12:00 AM  ?  ? ?Problem: Chronic Disease Management for the management of HTN   ?Priority: High  ?  ? ?Long-Range Goal: Develop plan of care related to management of HTN   ?Start Date: 10/17/2021  ?Expected End Date: 11/10/2022  ?This Visit's Progress: On track  ?Priority: High  ?Note:   ?Current Barriers:  ?Chronic Disease Management support and education needs related to HTN  ? ?RNCM Clinical Goal(s):  ?Patient will verbalize basic understanding of  HTN disease process and self health management plan as evidenced by blood pressure reports less than 140/80  through collaboration with RN Care manager, provider, and care team.  ? ?Interventions: ?Education and support related to Hypertension ?Inter-disciplinary care team collaboration (see longitudinal plan of care) ?Evaluation of current treatment plan related to  self management and patient's adherence to plan as established by provider ? ? ?Hypertension Interventions:  (Status:  Goal on track:  Yes.) Long Term Goal ?Last practice recorded BP readings:  ?BP Readings from Last 3 Encounters:  ?09/05/21 128/74  ?08/06/21 120/74  ?07/20/21 131/81  ?Most recent eGFR/CrCl: No results found for: EGFR  No components found for: CRCL ? ?Evaluation of current treatment plan related to hypertension self management and patient's adherence to plan as established by provider ?Provided education to patient re: stroke prevention, s/s of heart attack and stroke ?Reviewed medications with patient and discussed importance of compliance ?Discussed plans with patient for ongoing care management follow up and provided  patient with direct contact information for care management team ?Advised patient, providing education and rationale, to monitor blood pressure daily and record, calling PCP for findings outside established parameters ?Provided education on prescribed diet low salt diet ?Discussed complications of poorly controlled blood pressure such as heart disease, stroke, circulatory complications, vision complications, kidney impairment, sexual dysfunction ? ?01/10/22 Patient blood pressure good.  Last reading 113/61. Discussed hypertension management.   ? ?Patient Goals/Self-Care Activities: Hypertension ?Take all medications as prescribed ?Call provider office for new concerns or questions  ?check blood pressure 3 times per week ?write blood pressure results in a log or diary ?take blood pressure log to all doctor appointments ?call doctor for signs and symptoms of high blood pressure ?take medications for blood pressure exactly as prescribed ?eat more whole grains, fruits and vegetables, lean meats and healthy fats ?Limit salt intake ?      Stay as active as possible ? ? ?Follow Up Plan:  Telephone follow up appointment with care management team member scheduled for:  May ?We have been unable to make contact with the patient for follow up. The care management team is available to follow up with the patient after provider conversation with the patient regarding recommendation for care management engagement and subsequent re-referral to the care management team. ? ?  ? ?Plan: Follow-up: Patient agrees to Care Plan and Follow-up. ?Follow-up in 2 months. ? ?Jone Baseman, RN, MSN ?[facility] Care Management ?Care Management Coordinator ?Direct Line [phone] ?Toll Free: [phone]  ?Fax: [phone] ? ? ?

## 2022-01-10 NOTE — Patient Instructions (Signed)
Patient Goals/Self-Care Activities: Hypertension ?Take all medications as prescribed ?Call provider office for new concerns or questions  ?check blood pressure 3 times per week ?write blood pressure results in a log or diary ?take blood pressure log to all doctor appointments ?call doctor for signs and symptoms of high blood pressure ?take medications for blood pressure exactly as prescribed ?eat more whole grains, fruits and vegetables, lean meats and healthy fats ?Limit salt intake ?      Stay as active as possible ? ?

## 2022-01-15 ENCOUNTER — Ambulatory Visit: Payer: Medicare PPO | Admitting: Cardiology

## 2022-01-15 ENCOUNTER — Other Ambulatory Visit: Payer: Self-pay

## 2022-01-15 ENCOUNTER — Encounter: Payer: Self-pay | Admitting: Cardiology

## 2022-01-15 VITALS — BP 114/68 | HR 57 | Ht 71.0 in | Wt 172.0 lb

## 2022-01-15 DIAGNOSIS — I272 Pulmonary hypertension, unspecified: Secondary | ICD-10-CM | POA: Diagnosis not present

## 2022-01-15 DIAGNOSIS — I340 Nonrheumatic mitral (valve) insufficiency: Secondary | ICD-10-CM | POA: Diagnosis not present

## 2022-01-15 DIAGNOSIS — E7800 Pure hypercholesterolemia, unspecified: Secondary | ICD-10-CM

## 2022-01-15 DIAGNOSIS — I10 Essential (primary) hypertension: Secondary | ICD-10-CM

## 2022-01-15 DIAGNOSIS — I2510 Atherosclerotic heart disease of native coronary artery without angina pectoris: Secondary | ICD-10-CM | POA: Diagnosis not present

## 2022-01-15 DIAGNOSIS — I4821 Permanent atrial fibrillation: Secondary | ICD-10-CM

## 2022-01-15 DIAGNOSIS — I350 Nonrheumatic aortic (valve) stenosis: Secondary | ICD-10-CM | POA: Diagnosis not present

## 2022-01-15 DIAGNOSIS — G4733 Obstructive sleep apnea (adult) (pediatric): Secondary | ICD-10-CM | POA: Diagnosis not present

## 2022-01-15 DIAGNOSIS — I5022 Chronic systolic (congestive) heart failure: Secondary | ICD-10-CM

## 2022-01-15 DIAGNOSIS — I77819 Aortic ectasia, unspecified site: Secondary | ICD-10-CM

## 2022-01-15 NOTE — Patient Instructions (Signed)
Medication Instructions:  ?Your physician has recommended you make the following change in your medication: ?1) STOP taking digoxin ?*If you need a refill on your cardiac medications before your next appointment, please call your pharmacy* ? ?Testing/Procedures: ?Your physician has requested that you have an echocardiogram in September 2023. Echocardiography is a painless test that uses sound waves to create images of your heart. It provides your doctor with information about the size and shape of your heart and how well your heart?s chambers and valves are working. This procedure takes approximately one hour. There are no restrictions for this procedure. ? ? ?Follow-Up: ?At [facility], you and your health needs are our priority.  As part of our continuing mission to provide you with exceptional heart care, we have created designated Provider Care Teams.  These Care Teams include your primary Cardiologist (physician) and Advanced Practice Providers (APPs -  Physician Assistants and Nurse Practitioners) who all work together to provide you with the care you need, when you need it. ? ?Nurse visit in one week for an EKG  ? ?Your next appointment:   ?1 year(s) ? ?The format for your next appointment:   ?In Person ? ?Provider:   ?Fransico Him, MD   ? ? ?

## 2022-01-15 NOTE — Progress Notes (Signed)
Cardiology Office Note:    Date:  01/15/2022   ID:  Ryan Savage, DOB [birth year], MRN [id number]  PCP:  Ryan Manes, Savage  Cardiologist:  Ryan Him, Savage    Referring Savage: Ryan Manes, Savage   Chief Complaint  Patient presents with   Atrial Fibrillation   Hypertension   Mitral Regurgitation   Coronary Artery Disease   Hyperlipidemia   Congestive Heart Failure   Sleep Apnea   Aortic Stenosis    History of Present Illness:    Ryan Savage is a 88 y.o. male with a hx of posterior MV leaflet prolapse with moderate MR, tricuspid valve prolapse with mild to moderate TR, moderate pulmonary hypertension, HTN and chronic atrial fibrillation on chronic anticoagulation.  He has a known ischemic dilated cardiomyopathy with EF 30-35% with hypokinesis of the inferior, mid to apical anterior septal, inferior septal and apical anterior as well as apical myocardium.    Cath showed severe two-vessel coronary disease with chronic total occlusions of the LAD and RCA with extensive collaterals.  His left main, circumflex and intermediate branches were all normal.  He had low right and left-sided intracardiac filling pressures.  Mitral regurgitation  appeared mild to moderate and there were no significant V waves on a pulmonary capillary wedge tracings.  It was felt his mitral regurgitation was not playing a role in his acute exacerbation of CHF.  He was started on digoxin, Toprol and Lasix as well as Entresto.   He has a history of bilateral subdural hematomas with 9 mm right to left shift and his INR was reversed with vitamin K and K [facility] 01/28/2018.  He underwent bilateral bur holes for evacuation of subacute to chronic subdural hematomas.  He was discharged home on 01/29/2018 and was in rehab.  He was then readmitted twice with recurrent bleeds.  He is being followed by Neurosurgery.  His Entresto was stopped in the hospital due to soft BP.  He was re hospitalized with acute deep left insular CVA.   Carotid US showed no significant occlusion.  2D echo showed EF 45-50% which was stable.  He was started on ASA.  He was in afib with CVR.  He was recently evaluated for possible watchman left atrial appendage occlusion by Dr. Burt Savage.  They discussed the procedure at length with the patient at that time decided because of his advanced age and functional limitations that he did not want to undergo surgery at that time would think about it.  They were also concerned about the need for short-term anticoagulation with history of subdural hemorrhage requiring surgery in the past.  He is here today for followup and is doing well.  He denies any chest pain or pressure, SOB, DOE, PND, orthopnea, LE edema, dizziness, palpitations or syncope. He is compliant with his meds and is tolerating meds with no SE.     He is doing well with his CPAP device and thinks that he has gotten used to it.  He tolerates the mask and feels the pressure is adequate.  Since going on CPAP he feels rested in the am and has no significant daytime sleepiness.  He denies any significant mouth or nasal dryness or nasal congestion.  He does not think that he snores.     Past Medical History:  Diagnosis Date   Aortic stenosis 05/22/2018   Mild with mean AVG 58mHg by echo 05/2018   Atrial fibrillation, chronic (HCC)    Not on anticoagulation due to history of bilateral  subdural bleeds due to recurrent falls   BPH (benign prostatic hypertrophy)    [name] (coronary artery disease), native coronary artery    Severe two-vessel [name] with chronically occluded LAD and RCA with widely patent left main, intermediate, and left circumflex branches.  On medical management.  He has extensive collaterals.   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 fib with RVR.  Repeat echo 05/2018 with EF 45-50%.   [name] (degenerative joint  disease)    GERD (gastroesophageal reflux disease)    Hyperlipidemia    LDL goal less then 100   Hypertension    Microscopic hematuria    Dr Ryan Savage   Mitral valve prolapse    with mild MR   OSA (obstructive sleep apnea)    Pulmonary HTN (HCC)    PASP 66mHg by echo 05/2018 but normal pressures on right heart cath.   SDH (subdural hematoma) 01/2018   SIADH (syndrome of inappropriate ADH production) ([facility]    Skin cancer    "side of my nose" (11/19/2017)   Stroke ([facility] 12/2017   "fully recovered"   Thrombocytopenia ([facility]    Mild- platelet count 143,000 on 02/2011, stable 08/2011    Past Surgical History:  Procedure Laterality Date   APPENDECTOMY  1950   BACK SURGERY     BURR HOLE Bilateral 01/25/2018   Procedure: BHaskell Flirt  Surgeon: Ryan Savage;  [place] [facility]  Service: Neurosurgery;  Laterality: Bilateral;   BURR HOLE Left 02/20/2018   Procedure: BURR HOLES for subdural hematoma;  Surgeon: Ryan Savage;  [place] [facility]  Service: Neurosurgery;  Laterality: Left;   BURR HOLE Left 03/20/2018   Procedure: Craniotomy for subdural hematoma;  Surgeon: EKristeen Miss Savage;  [place] [facility]  Service: Neurosurgery;  Laterality: Left;   FOREARM FRACTURE SURGERY Right [place]  ruptured disc repair   RIGHT/LEFT HEART CATH AND CORONARY ANGIOGRAPHY N/A 11/21/2017   Procedure: RIGHT/LEFT HEART CATH AND CORONARY ANGIOGRAPHY;  Surgeon: Ryan Savage;  [facility];  Service: Cardiovascular;  Laterality: N/A;   SKIN CANCER EXCISION Left    "side of my nose"    Current Medications: Current Meds  Medication Sig   acetaminophen (TYLENOL) 325 MG tablet Take 1-2 tablets (325-650 mg total) by mouth every 4 (four) hours as needed for mild pain.   amLODipine (NORVASC) 2.5 MG tablet Take 1 tablet (2.5 mg total) by mouth every evening.   ascorbic acid (VITAMIN C) 500 MG tablet Take 500 mg by mouth daily.   aspirin EC 81 MG tablet Take 1  tablet (81 mg total) by mouth daily. Swallow whole.   Cholecalciferol (VITAMIN D3) 50 MCG (2000 UT) TABS Take 2,000 Units by mouth every other day.   digoxin (LANOXIN) 0.125 MG tablet Take 1 tablet (0.125 mg total) by mouth daily.   finasteride (PROSCAR) 5 MG tablet Take 1 tablet (5 mg total) by mouth daily.   Melatonin 3 MG TABS Take 3 mg by mouth at bedtime.   metoprolol succinate (TOPROL-XL) 25 MG 24 hr tablet Take 1 tablet (25 mg total) by mouth daily.   Multiple Vitamin (MULTIVITAMIN WITH MINERALS) TABS tablet Take 1 tablet by mouth daily.   simvastatin (ZOCOR) 10 MG tablet Take 1 tablet (10 mg total) by mouth every evening.   zinc  gluconate 50 MG tablet Take 50 mg by mouth daily.     Allergies:   Methylphenidate derivatives, Warfarin sodium, and Novocain [procaine]   Social History   Socioeconomic History   Marital status: Married    Spouse name: Not on file   Number of children: Not on file   Years of education: Not on file   Highest education level: Not on file  Occupational History   Occupation: retired  Tobacco Use   Smoking status: Former    Years: 3.00    Types: Cigarettes    Quit date: 1958    Years since quitting: 65.2   Smokeless tobacco: Never  Vaping Use   Vaping Use: Never used  Substance and Sexual Activity   Alcohol use: No   Drug use: No   Sexual activity: Not on file  Other Topics Concern   Not on file  Social History Narrative   Not on file   Social Determinants of Health   Financial Resource Strain: Not on file  Food Insecurity: Not on file  Transportation Needs: No Transportation Needs   Lack of Transportation (Medical): No   Lack of Transportation (Non-Medical): No  Physical Activity: Not on file  Stress: Not on file  Social Connections: Not on file     Family History: The patient's family history includes [name] in his brother and father; Colon cancer in his brother and sister; Heart attack in his brother and father; Heart failure in his  mother.  ROS:   Please see the history of present illness.    ROS  All other systems reviewed and negative.   EKGs/Labs/Other Studies Reviewed:    The following studies were reviewed today: none  EKG:  EKG is not ordered today.    Recent Labs: 07/17/2021: ALT 16; Hemoglobin 12.9; Platelets 146 07/18/2021: BUN 15; Creatinine, Ser 0.90; Potassium 3.9; Sodium 130   Recent Lipid Panel    Component Value Date/Time   CHOL 100 07/18/2021 0235   CHOL 107 01/12/2019 0832   TRIG 58 07/18/2021 0235   HDL 43 07/18/2021 0235   HDL 37 (L) 01/12/2019 0832   CHOLHDL 2.3 07/18/2021 0235   VLDL 12 07/18/2021 0235   LDLCALC 45 07/18/2021 0235   LDLCALC 51 01/12/2019 0832    Physical Exam:    VS:  Ht '5\' 11"'$  (1.803 m)    BMI 23.01 kg/m     Wt Readings from Last 3 Encounters:  09/05/21 165 lb (74.8 kg)  08/06/21 168 lb 6.4 oz (76.4 kg)  07/20/21 175 lb 0.7 oz (79.4 kg)    GEN: Well nourished, well developed in no acute distress HEENT: Normal NECK: No JVD; No carotid bruits LYMPHATICS: No lymphadenopathy CARDIAC:irregularly irregular, no murmurs, rubs, gallops RESPIRATORY:  Clear to auscultation without rales, wheezing or rhonchi  ABDOMEN: Soft, non-tender, non-distended MUSCULOSKELETAL:  No edema; No deformity  SKIN: Warm and dry NEUROLOGIC:  Alert and oriented x 3 PSYCHIATRIC:  Normal affect   ASSESSMENT:    1. Permanent atrial fibrillation ([place])   2. Benign essential HTN   3. Nonrheumatic mitral valve regurgitation   4. Coronary artery disease involving native coronary artery of native heart without angina pectoris   5. Chronic systolic CHF (congestive heart failure), NYHA class 2 ([place])   6. Nonrheumatic aortic valve stenosis   7. Pulmonary HTN ([place])   8. OSA (obstructive sleep apnea)   9. Pure hypercholesterolemia   10. Acquired dilation of ascending aorta and aortic root ([place])  PLAN:    In order of problems listed above:  1.  Permanent atrial fibrillation  -He  remains in atrial fibrillation with controlled ventricular response.  He denies any palpitations. -Continue prescription drug management with Toprol-XL 25 mg daily with as needed refills -his wife has kept a log of his vital signs recently and his HR has been maintaining in the 40's -stop digoxin -EKG in 1 week -not a candidate for anticoagulation due to hx of multiple subdural bleeds -He was recently evaluated for possible watchman left atrial appendage occlusion by Dr. Burt Savage.  They discussed the procedure at length with the patient at that time decided because of his advanced age and functional limitations that he did not want to undergo surgery at that time would think about it.  They were also concerned about the need for short-term anticoagulation with history of subdural hemorrhage requiring surgery in the past.  They feel the same today about that after we discussed it again   2.  HTN  -His blood pressure is adequately controlled on exam today -Continue prescription drug management Toprol-XL 25 mg daily and amlodipine 2.5 mg daily with as needed refills   3.  Mitral regurgitation  -echo 02/2021 and 07/2021 showed mildly thickened MV leaflets with mild MR  4.  ASCAD  -severe 2 vessel [name] with chronic total occlusions of the LAD and RCA with extensive collaterals.  His left main, circumflex and intermediate branches were all normal.   -He denies any anginal symptoms -Continue prescription drug management with Toprol-XL 25 mg daily and statin therapy -no ASA due to hx of subdural bleeds   5.  Chronic systolic CHF  -2D echo 06/8415 showed EF 45-50% but repeat echo 07/18/2021 showed normal LV function with EF 60 to 65% -He appears euvolemic on exam today -continue beta-blocker -had not required diuretics recently -continue with low Na diet   6.  Aortic stenosis  -2D echo 02/2021 showed mild AS -Repeat echo 07/18/2021 consistent with low-flow low gradient moderate AS and normal LV function EF  60 to 65% -Repeat echo 07/2022 to make sure this remains stable   7.  Pulmonary HTN  -moderate by echo 05/2019 with a PASP of 42 mmHg and echo ordered in hospital 02/2021 for CVA  -Repeat echo 07/2021 showed a PASP of 37.8 mmHg   8. OSA  -he stopped using it after he had his CVA last fall  9.  HLD -LDL goal < 70 -I have personally reviewed and interpreted outside labs performed by patient's PCP which showed LDL 45, HDL 43, triglycerides 58, ALT 16 on 07/18/2021 -continue simvastatin 10 mg daily with as needed refills  10.  Aortic root and ascending aortic dilatation -Both measured 44 mm by echo 07/2021 -Repeat 2D echo 07/2022  Followup with me in 1 year  Medication Adjustments/Labs and Tests Ordered: Current medicines are reviewed at length with the patient today.  Concerns regarding medicines are outlined above.  No orders of the defined types were placed in this encounter.  No orders of the defined types were placed in this encounter.   Signed, Ryan Him, Savage  01/15/2022 2:11 PM    [place]

## 2022-01-15 NOTE — Addendum Note (Signed)
Addended by: Antonieta Iba on: 01/15/2022 02:33 PM ? ? Modules accepted: Orders ? ?

## 2022-01-17 ENCOUNTER — Telehealth: Payer: Self-pay | Admitting: Cardiology

## 2022-01-17 NOTE — Telephone Encounter (Signed)
Patient's wife calling to reschedule nurse visit, because the patient has an other appointment.  Phone: [phone] ?

## 2022-01-22 ENCOUNTER — Ambulatory Visit: Payer: Medicare PPO

## 2022-01-22 DIAGNOSIS — H547 Unspecified visual loss: Secondary | ICD-10-CM | POA: Diagnosis not present

## 2022-01-22 DIAGNOSIS — I255 Ischemic cardiomyopathy: Secondary | ICD-10-CM | POA: Diagnosis not present

## 2022-01-22 DIAGNOSIS — I10 Essential (primary) hypertension: Secondary | ICD-10-CM | POA: Diagnosis not present

## 2022-01-22 DIAGNOSIS — E785 Hyperlipidemia, unspecified: Secondary | ICD-10-CM | POA: Diagnosis not present

## 2022-01-22 DIAGNOSIS — G629 Polyneuropathy, unspecified: Secondary | ICD-10-CM | POA: Diagnosis not present

## 2022-01-22 DIAGNOSIS — G4700 Insomnia, unspecified: Secondary | ICD-10-CM | POA: Diagnosis not present

## 2022-01-22 DIAGNOSIS — I4891 Unspecified atrial fibrillation: Secondary | ICD-10-CM | POA: Diagnosis not present

## 2022-01-22 DIAGNOSIS — I252 Old myocardial infarction: Secondary | ICD-10-CM | POA: Diagnosis not present

## 2022-01-22 DIAGNOSIS — I2510 Atherosclerotic heart disease of native coronary artery without angina pectoris: Secondary | ICD-10-CM | POA: Diagnosis not present

## 2022-01-25 ENCOUNTER — Ambulatory Visit (INDEPENDENT_AMBULATORY_CARE_PROVIDER_SITE_OTHER): Payer: Medicare PPO

## 2022-01-25 ENCOUNTER — Other Ambulatory Visit: Payer: Self-pay

## 2022-01-25 VITALS — BP 128/78 | HR 63 | Ht 71.0 in | Wt 172.0 lb

## 2022-01-25 DIAGNOSIS — I4821 Permanent atrial fibrillation: Secondary | ICD-10-CM | POA: Diagnosis not present

## 2022-01-25 NOTE — Progress Notes (Signed)
? ?  Nurse Visit  ?  ?Date of Encounter: 01/25/2022 ?ID: Ryan Savage, DOB [birth year], MRN [id number] ? ?PCP:  Lajean Manes, MD ?  ?Cardiologist:  Fransico Him, MD  ?Advanced Practice Provider:  No care team member to display ?Electrophysiologist:  None  ?   ? ? ?Visit Details  ? ?VS:  BP 128/78   Pulse 63   Ht '5\' 11"'$  (1.803 m)   Wt 172 lb (78 kg)   SpO2 99%   BMI 23.99 kg/m?  , BMI Body mass index is 23.99 kg/m?. ? ?Wt Readings from Last 3 Encounters:  ?01/25/22 172 lb (78 kg)  ?01/15/22 172 lb (78 kg)  ?09/05/21 165 lb (74.8 kg)  ?  ? ?Reason for visit: EKG per [name] for med change  ?Performed today: Vitals, EKG, Provider consulted:Dr. Angelena Form, and Education ?Changes (medications, testing, etc.) : no changes  ?Length of Visit: 45 minutes ? ?Medications Adjustments/Labs and Tests Ordered: ?No orders of the defined types were placed in this encounter. ? ?No orders of the defined types were placed in this encounter. ? ? ?Signed, ?Stephani Police, RN  ?01/25/2022 2:43 PM ? ? ? ? ?

## 2022-01-25 NOTE — Patient Instructions (Signed)
Medication Instructions:  Your physician recommends that you continue on your current medications as directed. Please refer to the Current Medication list given to you today.  *If you need a refill on your cardiac medications before your next appointment, please call your pharmacy*   Lab Work: none If you have labs (blood work) drawn today and your tests are completely normal, you will receive your results only by: MyChart Message (if you have MyChart) OR A paper copy in the mail If you have any lab test that is abnormal or we need to change your treatment, we will call you to review the results.   Testing/Procedures: none   Follow-Up: At [facility], you and your health needs are our priority.  As part of our continuing mission to provide you with exceptional heart care, we have created designated Provider Care Teams.  These Care Teams include your primary Cardiologist (physician) and Advanced Practice Providers (APPs -  Physician Assistants and Nurse Practitioners) who all work together to provide you with the care you need, when you need it.  We recommend signing up for the patient portal called "MyChart".  Sign up information is provided on this After Visit Summary.  MyChart is used to connect with patients for Virtual Visits (Telemedicine).  Patients are able to view lab/test results, encounter notes, upcoming appointments, etc.  Non-urgent messages can be sent to your provider as well.   To learn more about what you can do with MyChart, go to [email or web address].    

## 2022-01-30 ENCOUNTER — Telehealth: Payer: Self-pay | Admitting: *Deleted

## 2022-03-12 NOTE — Telephone Encounter (Signed)
error 

## 2022-03-13 ENCOUNTER — Ambulatory Visit: Payer: Medicare PPO | Admitting: Adult Health

## 2022-03-25 ENCOUNTER — Other Ambulatory Visit: Payer: Self-pay

## 2022-03-25 NOTE — Patient Instructions (Signed)
Patient Goals/Self-Care Activities: Hypertension ?Take all medications as prescribed ?Call provider office for new concerns or questions  ?check blood pressure 3 times per week ?write blood pressure results in a log or diary ?take blood pressure log to all doctor appointments ?call doctor for signs and symptoms of high blood pressure ?take medications for blood pressure exactly as prescribed ?eat more whole grains, fruits and vegetables, lean meats and healthy fats ?Limit salt intake ?      Stay as active as possible ? ?

## 2022-03-25 NOTE — Patient Outreach (Signed)
[facility]) Care Management ? ?03/25/2022 ? ?Ryan Savage ?Mar 23, 1929 ?[id number] ? ? ?Telephone call to patient for disease management.  Patient doing well.  Hypertension management reiterated. No concerns.   ? ?Care Plan : RN Care Manager Plan of Care  ?Updates made by Jon Billings, RN since 03/25/2022 12:00 AM  ?  ? ?Problem: Chronic Disease Management for the management of HTN   ?Priority: High  ?  ? ?Long-Range Goal: Development plan of care related to management of HTN   ?Start Date: 10/17/2021  ?Expected End Date: 11/10/2022  ?This Visit's Progress: On track  ?Recent Progress: On track  ?Priority: High  ?Note:   ?Current Barriers:  ?Chronic Disease Management support and education needs related to HTN  ? ?RNCM Clinical Goal(s):  ?Patient will verbalize basic understanding of  HTN disease process and self health management plan as evidenced by blood pressure reports less than 140/80  through collaboration with RN Care manager, provider, and care team.  ? ?Interventions: ?Education and support related to Hypertension ?Inter-disciplinary care team collaboration (see longitudinal plan of care) ?Evaluation of current treatment plan related to  self management and patient's adherence to plan as established by provider ? ? ?Hypertension Interventions:  (Status:  Goal on track:  Yes.) Long Term Goal ?Last practice recorded BP readings:  ?BP Readings from Last 3 Encounters:  ?09/05/21 128/74  ?08/06/21 120/74  ?07/20/21 131/81  ?Most recent eGFR/CrCl: No results found for: EGFR  No components found for: CRCL ? ?Evaluation of current treatment plan related to hypertension self management and patient's adherence to plan as established by provider ?Provided education to patient re: stroke prevention, s/s of heart attack and stroke ?Reviewed medications with patient and discussed importance of compliance ?Discussed plans with patient for ongoing care management follow up and provided patient with direct  contact information for care management team ?Advised patient, providing education and rationale, to monitor blood pressure daily and record, calling PCP for findings outside established parameters ?Provided education on prescribed diet low salt diet ?Discussed complications of poorly controlled blood pressure such as heart disease, stroke, circulatory complications, vision complications, kidney impairment, sexual dysfunction ? ?01/10/22 Patient blood pressure good.  Last reading 113/61. Discussed hypertension management.   ? ?03/25/22 Patient reports his blood pressure good. Last reading 127/71.  Reiterated hypertension management.   No concerns.   ? ?Patient Goals/Self-Care Activities: Hypertension ?Take all medications as prescribed ?Call provider office for new concerns or questions  ?check blood pressure 3 times per week ?write blood pressure results in a log or diary ?take blood pressure log to all doctor appointments ?call doctor for signs and symptoms of high blood pressure ?take medications for blood pressure exactly as prescribed ?eat more whole grains, fruits and vegetables, lean meats and healthy fats ?Limit salt intake ?      Stay as active as possible ? ? ?Follow Up Plan:  Telephone follow up appointment with care management team member scheduled for:  August ?We have been unable to make contact with the patient for follow up. The care management team is available to follow up with the patient after provider conversation with the patient regarding recommendation for care management engagement and subsequent re-referral to the care management team. ? ?  ? ?Plan: Follow-up: Patient agrees to Care Plan and Follow-up. ?Follow-up in August.  ? ?Jone Baseman, RN, MSN ?[facility] Care Management ?Care Management Coordinator ?Direct Line [phone] ?Toll Free: [phone]  ?Fax: [phone] ? ?

## 2022-03-28 ENCOUNTER — Ambulatory Visit: Payer: Self-pay

## 2022-05-29 DIAGNOSIS — Z0000 Encounter for general adult medical examination without abnormal findings: Secondary | ICD-10-CM | POA: Diagnosis not present

## 2022-05-29 DIAGNOSIS — G629 Polyneuropathy, unspecified: Secondary | ICD-10-CM | POA: Diagnosis not present

## 2022-05-29 DIAGNOSIS — E7800 Pure hypercholesterolemia, unspecified: Secondary | ICD-10-CM | POA: Diagnosis not present

## 2022-05-29 DIAGNOSIS — I10 Essential (primary) hypertension: Secondary | ICD-10-CM | POA: Diagnosis not present

## 2022-05-29 DIAGNOSIS — D696 Thrombocytopenia, unspecified: Secondary | ICD-10-CM | POA: Diagnosis not present

## 2022-05-29 DIAGNOSIS — I4891 Unspecified atrial fibrillation: Secondary | ICD-10-CM | POA: Diagnosis not present

## 2022-05-29 DIAGNOSIS — H353132 Nonexudative age-related macular degeneration, bilateral, intermediate dry stage: Secondary | ICD-10-CM | POA: Diagnosis not present

## 2022-05-29 DIAGNOSIS — E222 Syndrome of inappropriate secretion of antidiuretic hormone: Secondary | ICD-10-CM | POA: Diagnosis not present

## 2022-05-29 DIAGNOSIS — Z79899 Other long term (current) drug therapy: Secondary | ICD-10-CM | POA: Diagnosis not present

## 2022-06-17 ENCOUNTER — Other Ambulatory Visit: Payer: Self-pay

## 2022-06-17 NOTE — Patient Outreach (Signed)
[facility]) Care Management  06/17/2022  Ryan Savage 12-05-1928 [id number]   Patient meeting goals.  Discussed closing case. Patient agreeable. No concerns.    Care Plan : RN Care Manager Plan of Care  Updates made by Jon Billings, RN since 06/17/2022 12:00 AM  Completed 06/17/2022   Problem: Chronic Disease Management for the management of HTN Resolved 06/17/2022  Priority: High     Long-Range Goal: Development plan of care related to management of HTN Completed 06/17/2022  Start Date: 10/17/2021  Expected End Date: 11/10/2022  Recent Progress: On track  Priority: High  Note:   Current Barriers:  Chronic Disease Management support and education needs related to HTN   RNCM Clinical Goal(s):  Patient will verbalize basic understanding of  HTN disease process and self health management plan as evidenced by blood pressure reports less than 140/80  through collaboration with RN Care manager, provider, and care team.   Interventions: Education and support related to Hypertension Inter-disciplinary care team collaboration (see longitudinal plan of care) Evaluation of current treatment plan related to  self management and patient's adherence to plan as established by provider   Hypertension Interventions:  (Status:  Goal on track:  Yes.) Long Term Goal Last practice recorded BP readings:  BP Readings from Last 3 Encounters:  09/05/21 128/74  08/06/21 120/74  07/20/21 131/81  Most recent eGFR/CrCl: No results found for: EGFR  No components found for: CRCL  Evaluation of current treatment plan related to hypertension self management and patient's adherence to plan as established by provider Provided education to patient re: stroke prevention, s/s of heart attack and stroke Reviewed medications with patient and discussed importance of compliance Discussed plans with patient for ongoing care management follow up and provided patient with direct contact information for  care management team Advised patient, providing education and rationale, to monitor blood pressure daily and record, calling PCP for findings outside established parameters Provided education on prescribed diet low salt diet Discussed complications of poorly controlled blood pressure such as heart disease, stroke, circulatory complications, vision complications, kidney impairment, sexual dysfunction  01/10/22 Patient blood pressure good.  Last reading 113/61. Discussed hypertension management.    03/25/22 Patient reports his blood pressure good. Last reading 127/71.  Reiterated hypertension management.   No concerns.    Patient Goals/Self-Care Activities: Hypertension Take all medications as prescribed Call provider office for new concerns or questions  check blood pressure 3 times per week write blood pressure results in a log or diary take blood pressure log to all doctor appointments call doctor for signs and symptoms of high blood pressure take medications for blood pressure exactly as prescribed eat more whole grains, fruits and vegetables, lean meats and healthy fats Limit salt intake       Stay as active as possible   Follow Up Plan:  Closing case.     Plan: Closing case.  Jone Baseman, RN, MSN [facility] Care Management Care Management Coordinator Direct Line [phone] Toll Free: [phone]  Fax: [phone]'

## 2022-06-27 DIAGNOSIS — H35342 Macular cyst, hole, or pseudohole, left eye: Secondary | ICD-10-CM | POA: Diagnosis not present

## 2022-06-27 DIAGNOSIS — H2513 Age-related nuclear cataract, bilateral: Secondary | ICD-10-CM | POA: Diagnosis not present

## 2022-06-27 DIAGNOSIS — I69898 Other sequelae of other cerebrovascular disease: Secondary | ICD-10-CM | POA: Diagnosis not present

## 2022-06-27 DIAGNOSIS — H43813 Vitreous degeneration, bilateral: Secondary | ICD-10-CM | POA: Diagnosis not present

## 2022-06-27 DIAGNOSIS — H53461 Homonymous bilateral field defects, right side: Secondary | ICD-10-CM | POA: Diagnosis not present

## 2022-06-27 DIAGNOSIS — H34811 Central retinal vein occlusion, right eye, with macular edema: Secondary | ICD-10-CM | POA: Diagnosis not present

## 2022-06-27 DIAGNOSIS — H353132 Nonexudative age-related macular degeneration, bilateral, intermediate dry stage: Secondary | ICD-10-CM | POA: Diagnosis not present

## 2022-07-24 ENCOUNTER — Ambulatory Visit (HOSPITAL_COMMUNITY): Payer: Medicare PPO | Attending: Cardiology

## 2022-07-24 DIAGNOSIS — I5022 Chronic systolic (congestive) heart failure: Secondary | ICD-10-CM | POA: Insufficient documentation

## 2022-07-24 DIAGNOSIS — G4733 Obstructive sleep apnea (adult) (pediatric): Secondary | ICD-10-CM

## 2022-07-24 DIAGNOSIS — I4821 Permanent atrial fibrillation: Secondary | ICD-10-CM | POA: Diagnosis not present

## 2022-07-24 DIAGNOSIS — I350 Nonrheumatic aortic (valve) stenosis: Secondary | ICD-10-CM | POA: Insufficient documentation

## 2022-07-24 DIAGNOSIS — I77819 Aortic ectasia, unspecified site: Secondary | ICD-10-CM | POA: Diagnosis present

## 2022-07-24 DIAGNOSIS — E7800 Pure hypercholesterolemia, unspecified: Secondary | ICD-10-CM | POA: Insufficient documentation

## 2022-07-24 DIAGNOSIS — I340 Nonrheumatic mitral (valve) insufficiency: Secondary | ICD-10-CM

## 2022-07-24 DIAGNOSIS — I2510 Atherosclerotic heart disease of native coronary artery without angina pectoris: Secondary | ICD-10-CM

## 2022-07-24 DIAGNOSIS — I10 Essential (primary) hypertension: Secondary | ICD-10-CM | POA: Insufficient documentation

## 2022-07-24 DIAGNOSIS — I272 Pulmonary hypertension, unspecified: Secondary | ICD-10-CM | POA: Diagnosis not present

## 2022-07-24 LAB — ECHOCARDIOGRAM COMPLETE
AR max vel: 1.2 cm2
AV Area VTI: 1.31 cm2
AV Area mean vel: 1.17 cm2
AV Mean grad: 10.2 mmHg
AV Peak grad: 19.6 mmHg
Ao pk vel: 2.21 m/s
Area-P 1/2: 3.69 cm2
P 1/2 time: 445 msec
S' Lateral: 4 cm

## 2022-07-29 ENCOUNTER — Telehealth: Payer: Self-pay | Admitting: Cardiology

## 2022-07-29 DIAGNOSIS — I340 Nonrheumatic mitral (valve) insufficiency: Secondary | ICD-10-CM

## 2022-07-29 DIAGNOSIS — I5022 Chronic systolic (congestive) heart failure: Secondary | ICD-10-CM

## 2022-07-29 DIAGNOSIS — I272 Pulmonary hypertension, unspecified: Secondary | ICD-10-CM

## 2022-07-29 NOTE — Telephone Encounter (Signed)
The patient has been notified of the result and verbalized understanding.  All questions (if any) were answered. Antonieta Iba, RN 07/29/2022 11:45 AM   Patient denies any shortness of breath or lower extremity edema. He states that he does get tired after going on walks but it has not worsened.   Repeat echo has been ordered.

## 2022-07-29 NOTE — Telephone Encounter (Signed)
-----   Message from Sueanne Margarita, MD sent at 07/25/2022 11:51 AM EDT ----- Echo shows mild LV dysfunction with EF 45 to 50% with mildly thickened heart muscle, mild pulmonary hypertension, severe biatrial enlargement, moderate to severe MR, moderate TR and moderate to severe aortic stenosis.  Compared to prior study aortic stenosis appears slightly worse>> repeat echo in 6 months.  Please find out if he is having any symptoms of shortness of breath, lower extremity edema or worsening exertional fatigue

## 2022-07-29 NOTE — Telephone Encounter (Signed)
Patient and his wife returning call for echo results.

## 2022-08-01 DIAGNOSIS — H34811 Central retinal vein occlusion, right eye, with macular edema: Secondary | ICD-10-CM | POA: Diagnosis not present

## 2022-08-01 DIAGNOSIS — H35342 Macular cyst, hole, or pseudohole, left eye: Secondary | ICD-10-CM | POA: Diagnosis not present

## 2022-08-01 DIAGNOSIS — H353132 Nonexudative age-related macular degeneration, bilateral, intermediate dry stage: Secondary | ICD-10-CM | POA: Diagnosis not present

## 2022-08-29 DIAGNOSIS — H353132 Nonexudative age-related macular degeneration, bilateral, intermediate dry stage: Secondary | ICD-10-CM | POA: Diagnosis not present

## 2022-08-29 DIAGNOSIS — H3589 Other specified retinal disorders: Secondary | ICD-10-CM | POA: Diagnosis not present

## 2022-08-29 DIAGNOSIS — H35342 Macular cyst, hole, or pseudohole, left eye: Secondary | ICD-10-CM | POA: Diagnosis not present

## 2022-08-29 DIAGNOSIS — H34811 Central retinal vein occlusion, right eye, with macular edema: Secondary | ICD-10-CM | POA: Diagnosis not present

## 2022-10-16 DIAGNOSIS — I6930 Unspecified sequelae of cerebral infarction: Secondary | ICD-10-CM | POA: Diagnosis not present

## 2022-10-16 DIAGNOSIS — E7800 Pure hypercholesterolemia, unspecified: Secondary | ICD-10-CM | POA: Diagnosis not present

## 2022-10-16 DIAGNOSIS — I10 Essential (primary) hypertension: Secondary | ICD-10-CM | POA: Diagnosis not present

## 2022-10-16 DIAGNOSIS — I5022 Chronic systolic (congestive) heart failure: Secondary | ICD-10-CM | POA: Diagnosis not present

## 2022-10-16 DIAGNOSIS — I4891 Unspecified atrial fibrillation: Secondary | ICD-10-CM | POA: Diagnosis not present

## 2022-10-21 ENCOUNTER — Inpatient Hospital Stay (HOSPITAL_COMMUNITY)
Admission: EM | Admit: 2022-10-21 | Discharge: 2022-10-31 | DRG: 064 | Disposition: A | Discharge status: Hospice | Payer: Medicare PPO | Attending: Internal Medicine | Admitting: Internal Medicine

## 2022-10-21 ENCOUNTER — Emergency Department (HOSPITAL_COMMUNITY): Payer: Medicare PPO

## 2022-10-21 ENCOUNTER — Encounter (HOSPITAL_COMMUNITY): Payer: Self-pay | Admitting: Emergency Medicine

## 2022-10-21 DIAGNOSIS — N400 Enlarged prostate without lower urinary tract symptoms: Secondary | ICD-10-CM | POA: Diagnosis present

## 2022-10-21 DIAGNOSIS — Z7984 Long term (current) use of oral hypoglycemic drugs: Secondary | ICD-10-CM

## 2022-10-21 DIAGNOSIS — G9341 Metabolic encephalopathy: Secondary | ICD-10-CM | POA: Diagnosis present

## 2022-10-21 DIAGNOSIS — I5022 Chronic systolic (congestive) heart failure: Secondary | ICD-10-CM | POA: Diagnosis present

## 2022-10-21 DIAGNOSIS — I110 Hypertensive heart disease with heart failure: Secondary | ICD-10-CM | POA: Diagnosis present

## 2022-10-21 DIAGNOSIS — Z7982 Long term (current) use of aspirin: Secondary | ICD-10-CM

## 2022-10-21 DIAGNOSIS — J189 Pneumonia, unspecified organism: Secondary | ICD-10-CM | POA: Diagnosis present

## 2022-10-21 DIAGNOSIS — Z79890 Hormone replacement therapy (postmenopausal): Secondary | ICD-10-CM

## 2022-10-21 DIAGNOSIS — R29810 Facial weakness: Secondary | ICD-10-CM | POA: Diagnosis present

## 2022-10-21 DIAGNOSIS — F05 Delirium due to known physiological condition: Secondary | ICD-10-CM | POA: Diagnosis not present

## 2022-10-21 DIAGNOSIS — R4182 Altered mental status, unspecified: Secondary | ICD-10-CM | POA: Diagnosis not present

## 2022-10-21 DIAGNOSIS — R079 Chest pain, unspecified: Secondary | ICD-10-CM | POA: Diagnosis not present

## 2022-10-21 DIAGNOSIS — E871 Hypo-osmolality and hyponatremia: Secondary | ICD-10-CM | POA: Diagnosis present

## 2022-10-21 DIAGNOSIS — Z87891 Personal history of nicotine dependence: Secondary | ICD-10-CM

## 2022-10-21 DIAGNOSIS — R531 Weakness: Secondary | ICD-10-CM | POA: Diagnosis not present

## 2022-10-21 DIAGNOSIS — I63432 Cerebral infarction due to embolism of left posterior cerebral artery: Secondary | ICD-10-CM | POA: Diagnosis present

## 2022-10-21 DIAGNOSIS — R29711 NIHSS score 11: Secondary | ICD-10-CM | POA: Diagnosis present

## 2022-10-21 DIAGNOSIS — Z66 Do not resuscitate: Secondary | ICD-10-CM | POA: Diagnosis present

## 2022-10-21 DIAGNOSIS — I69398 Other sequelae of cerebral infarction: Secondary | ICD-10-CM

## 2022-10-21 DIAGNOSIS — M25531 Pain in right wrist: Secondary | ICD-10-CM | POA: Diagnosis not present

## 2022-10-21 DIAGNOSIS — I272 Pulmonary hypertension, unspecified: Secondary | ICD-10-CM | POA: Diagnosis present

## 2022-10-21 DIAGNOSIS — Y92239 Unspecified place in hospital as the place of occurrence of the external cause: Secondary | ICD-10-CM | POA: Diagnosis not present

## 2022-10-21 DIAGNOSIS — R296 Repeated falls: Secondary | ICD-10-CM | POA: Diagnosis present

## 2022-10-21 DIAGNOSIS — Z800 Family history of malignant neoplasm of digestive organs: Secondary | ICD-10-CM

## 2022-10-21 DIAGNOSIS — I2510 Atherosclerotic heart disease of native coronary artery without angina pectoris: Secondary | ICD-10-CM | POA: Diagnosis present

## 2022-10-21 DIAGNOSIS — I482 Chronic atrial fibrillation, unspecified: Secondary | ICD-10-CM | POA: Diagnosis present

## 2022-10-21 DIAGNOSIS — R7989 Other specified abnormal findings of blood chemistry: Secondary | ICD-10-CM | POA: Insufficient documentation

## 2022-10-21 DIAGNOSIS — T50995A Adverse effect of other drugs, medicaments and biological substances, initial encounter: Secondary | ICD-10-CM | POA: Diagnosis not present

## 2022-10-21 DIAGNOSIS — R0682 Tachypnea, not elsewhere classified: Secondary | ICD-10-CM | POA: Diagnosis not present

## 2022-10-21 DIAGNOSIS — E861 Hypovolemia: Secondary | ICD-10-CM | POA: Diagnosis present

## 2022-10-21 DIAGNOSIS — G459 Transient cerebral ischemic attack, unspecified: Secondary | ICD-10-CM | POA: Diagnosis not present

## 2022-10-21 DIAGNOSIS — R000 Tachycardia, unspecified: Secondary | ICD-10-CM | POA: Diagnosis not present

## 2022-10-21 DIAGNOSIS — Z4682 Encounter for fitting and adjustment of non-vascular catheter: Secondary | ICD-10-CM | POA: Diagnosis not present

## 2022-10-21 DIAGNOSIS — R1310 Dysphagia, unspecified: Secondary | ICD-10-CM | POA: Diagnosis present

## 2022-10-21 DIAGNOSIS — H9190 Unspecified hearing loss, unspecified ear: Secondary | ICD-10-CM | POA: Diagnosis present

## 2022-10-21 DIAGNOSIS — R471 Dysarthria and anarthria: Secondary | ICD-10-CM | POA: Diagnosis present

## 2022-10-21 DIAGNOSIS — I77810 Thoracic aortic ectasia: Secondary | ICD-10-CM | POA: Diagnosis present

## 2022-10-21 DIAGNOSIS — R52 Pain, unspecified: Secondary | ICD-10-CM | POA: Diagnosis not present

## 2022-10-21 DIAGNOSIS — H539 Unspecified visual disturbance: Secondary | ICD-10-CM | POA: Diagnosis present

## 2022-10-21 DIAGNOSIS — R32 Unspecified urinary incontinence: Secondary | ICD-10-CM | POA: Diagnosis present

## 2022-10-21 DIAGNOSIS — I639 Cerebral infarction, unspecified: Secondary | ICD-10-CM

## 2022-10-21 DIAGNOSIS — I63411 Cerebral infarction due to embolism of right middle cerebral artery: Principal | ICD-10-CM | POA: Diagnosis present

## 2022-10-21 DIAGNOSIS — K219 Gastro-esophageal reflux disease without esophagitis: Secondary | ICD-10-CM | POA: Diagnosis present

## 2022-10-21 DIAGNOSIS — Z1152 Encounter for screening for COVID-19: Secondary | ICD-10-CM | POA: Diagnosis not present

## 2022-10-21 DIAGNOSIS — Z515 Encounter for palliative care: Secondary | ICD-10-CM | POA: Diagnosis not present

## 2022-10-21 DIAGNOSIS — Z79899 Other long term (current) drug therapy: Secondary | ICD-10-CM | POA: Diagnosis not present

## 2022-10-21 DIAGNOSIS — R4702 Dysphasia: Secondary | ICD-10-CM | POA: Diagnosis present

## 2022-10-21 DIAGNOSIS — G8194 Hemiplegia, unspecified affecting left nondominant side: Secondary | ICD-10-CM | POA: Diagnosis present

## 2022-10-21 DIAGNOSIS — I6389 Other cerebral infarction: Secondary | ICD-10-CM | POA: Diagnosis not present

## 2022-10-21 DIAGNOSIS — R0609 Other forms of dyspnea: Secondary | ICD-10-CM | POA: Diagnosis not present

## 2022-10-21 DIAGNOSIS — Z85828 Personal history of other malignant neoplasm of skin: Secondary | ICD-10-CM

## 2022-10-21 DIAGNOSIS — R29898 Other symptoms and signs involving the musculoskeletal system: Secondary | ICD-10-CM | POA: Diagnosis not present

## 2022-10-21 DIAGNOSIS — D696 Thrombocytopenia, unspecified: Secondary | ICD-10-CM | POA: Diagnosis present

## 2022-10-21 DIAGNOSIS — G4733 Obstructive sleep apnea (adult) (pediatric): Secondary | ICD-10-CM | POA: Diagnosis present

## 2022-10-21 DIAGNOSIS — I6340 Cerebral infarction due to embolism of unspecified cerebral artery: Secondary | ICD-10-CM | POA: Diagnosis present

## 2022-10-21 DIAGNOSIS — R0902 Hypoxemia: Secondary | ICD-10-CM | POA: Diagnosis not present

## 2022-10-21 DIAGNOSIS — Z9181 History of falling: Secondary | ICD-10-CM

## 2022-10-21 DIAGNOSIS — R4781 Slurred speech: Secondary | ICD-10-CM | POA: Diagnosis not present

## 2022-10-21 DIAGNOSIS — E785 Hyperlipidemia, unspecified: Secondary | ICD-10-CM | POA: Diagnosis present

## 2022-10-21 DIAGNOSIS — K3189 Other diseases of stomach and duodenum: Secondary | ICD-10-CM | POA: Diagnosis not present

## 2022-10-21 DIAGNOSIS — I493 Ventricular premature depolarization: Secondary | ICD-10-CM | POA: Diagnosis not present

## 2022-10-21 DIAGNOSIS — G319 Degenerative disease of nervous system, unspecified: Secondary | ICD-10-CM | POA: Diagnosis not present

## 2022-10-21 DIAGNOSIS — Z888 Allergy status to other drugs, medicaments and biological substances status: Secondary | ICD-10-CM

## 2022-10-21 DIAGNOSIS — Z8673 Personal history of transient ischemic attack (TIA), and cerebral infarction without residual deficits: Secondary | ICD-10-CM | POA: Diagnosis not present

## 2022-10-21 DIAGNOSIS — I10 Essential (primary) hypertension: Secondary | ICD-10-CM | POA: Diagnosis not present

## 2022-10-21 DIAGNOSIS — Z8249 Family history of ischemic heart disease and other diseases of the circulatory system: Secondary | ICD-10-CM

## 2022-10-21 DIAGNOSIS — I69119 Unspecified symptoms and signs involving cognitive functions following nontraumatic intracerebral hemorrhage: Secondary | ICD-10-CM

## 2022-10-21 DIAGNOSIS — R29818 Other symptoms and signs involving the nervous system: Secondary | ICD-10-CM | POA: Diagnosis not present

## 2022-10-21 DIAGNOSIS — Z7401 Bed confinement status: Secondary | ICD-10-CM | POA: Diagnosis not present

## 2022-10-21 LAB — CBG MONITORING, ED: Glucose-Capillary: 108 mg/dL — ABNORMAL HIGH (ref 70–99)

## 2022-10-21 MED ORDER — SODIUM CHLORIDE 0.9% FLUSH
3.0000 mL | Freq: Once | INTRAVENOUS | Status: AC
  Administered 2022-10-22: 3 mL via INTRAVENOUS

## 2022-10-21 NOTE — ED Provider Notes (Signed)
[place] EMERGENCY DEPARTMENT Provider Note   CSN: [id number] Arrival date & time: 10/21/22  2351     History {Add pertinent medical, surgical, social history, OB history to HPI:1} No chief complaint on file.   Ryan Savage is a 88 y.o. male.  Patient as above with significant medical history as below, including afib not on AC, [name], CHF, HTN, SIADH, prior CVA who presents to the ED with complaint of stroke alert.      Past Medical History:  Diagnosis Date   Aortic stenosis 05/22/2018   Mild with mean AVG 8mHg by echo 05/2018   Atrial fibrillation, chronic (HCC)    Not on anticoagulation due to history of bilateral subdural bleeds due to recurrent falls   BPH (benign prostatic hypertrophy)    [name] (coronary artery disease), native coronary artery    Severe two-vessel [name] with chronically occluded LAD and RCA with widely patent left main, intermediate, and left circumflex branches.  On medical management.  He has extensive collaterals.   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 fib with RVR.  Repeat echo 05/2018 with EF 45-50%.   [name] (degenerative joint disease)    GERD (gastroesophageal reflux disease)    Hyperlipidemia    LDL goal less then 100   Hypertension    Microscopic hematuria    Dr DDiona Fanti  Mitral valve prolapse    with mild MR   OSA (obstructive sleep apnea)    Pulmonary HTN (HCC)    PASP 512mg by echo 05/2018 but normal pressures on right heart cath.   [place]) [place]   SIADH (syndrome of inappropriate ADH production) ([facility]   Skin cancer    "side of my nose" (11/19/2017)   [place]   "fully recovered"   Thrombocytopenia ([facility]   Mild- platelet count 143,000 on 02/2011, stable 08/2011    Past Surgical History:  Procedure Laterality Date   APPENDECTOMY  1950    BACK SURGERY     BURR HOLE Bilateral 01/25/2018   Procedure: BUHaskell Flirt Surgeon: CrKary KosMD;  [facility];  Laterality: Bilateral;   BURR HOLE Left 02/20/2018   Procedure: BURR HOLES for subdural hematoma;  Surgeon: JeNewman PiesMD;  [facility];  Laterality: Left;   BURR HOLE Left 03/20/2018   Procedure: Craniotomy for subdural hematoma;  Surgeon: ElKristeen MissMD;  [facility];  Laterality: Left;   FOREARM FRACTURE SURGERY Right [place] ruptured disc repair   RIGHT/LEFT HEART CATH AND CORONARY ANGIOGRAPHY N/A 11/21/2017   Procedure: RIGHT/LEFT HEART CATH AND CORONARY ANGIOGRAPHY;  Surgeon: CoSherren MochaMD;  [facility];  Service: Cardiovascular;  Laterality: N/A;   SKIN CANCER EXCISION Left    "side of my nose"     The history is provided by the patient and the EMS personnel. No language interpreter was used.       Home Medications Prior to Admission medications   Medication Sig Start Date End Date Taking? Authorizing Provider  acetaminophen (TYLENOL) 325 MG tablet Take 1-2 tablets (325-650 mg total) by mouth every 4 (four) hours as needed for mild pain. 04/03/18   Love, PaIvan AnchorsPA-C  amLODipine (NORVASC) 2.5 MG tablet  Take 1 tablet (2.5 mg total) by mouth every evening. 03/28/21   Sueanne Margarita, MD  ascorbic acid (VITAMIN C) 500 MG tablet Take 500 mg by mouth daily.    [provider]  Cholecalciferol (VITAMIN D3) 50 MCG (2000 UT) TABS Take 2,000 Units by mouth every other day.    [provider]  finasteride (PROSCAR) 5 MG tablet Take 1 tablet (5 mg total) by mouth daily. 03/05/18   Love, Ivan Anchors, PA-C  Melatonin 3 MG TABS Take 3 mg by mouth at bedtime.    [provider]  metoprolol succinate (TOPROL-XL) 25 MG 24 hr tablet Take 1 tablet (25 mg total) by mouth daily. 03/28/21   Sueanne Margarita, MD  Multiple Vitamin  (MULTIVITAMIN WITH MINERALS) TABS tablet Take 1 tablet by mouth daily. 04/04/18   Love, Ivan Anchors, PA-C  simvastatin (ZOCOR) 10 MG tablet Take 1 tablet (10 mg total) by mouth every evening. 03/28/21   Sueanne Margarita, MD  zinc gluconate 50 MG tablet Take 50 mg by mouth daily.    [provider]      Allergies    Methylphenidate derivatives, Warfarin sodium, and Novocain [procaine]    Review of Systems   Review of Systems  Physical Exam Updated Vital Signs There were no vitals taken for this visit. Physical Exam  ED Results / Procedures / Treatments   Labs (all labs ordered are listed, but only abnormal results are displayed) Labs Reviewed  PROTIME-INR  APTT  CBC  DIFFERENTIAL  COMPREHENSIVE METABOLIC PANEL  ETHANOL  I-STAT CHEM 8, ED  CBG MONITORING, ED    EKG None  Radiology No results found.  Procedures Procedures  {Document cardiac monitor, telemetry assessment procedure when appropriate:1}  Medications Ordered in ED Medications  sodium chloride flush (NS) 0.9 % injection 3 mL (has no administration in time range)    ED Course/ Medical Decision Making/ A&P                           Medical Decision Making Amount and/or Complexity of Data Reviewed Labs: ordered. Radiology: ordered.   This patient presents to the ED with chief complaint(s) of *** with pertinent past medical history of *** which further complicates the presenting complaint. The complaint involves an extensive differential diagnosis and also carries with it a high risk of complications and morbidity.    The differential diagnosis includes but not limited to ***. Serious etiologies were considered.   The initial plan is to ***   Additional history obtained: Additional history obtained from {additional history:26846} Records reviewed {records:26847}  Independent labs interpretation:  The following labs were independently interpreted: ***  Independent visualization of imaging: -  I independently visualized the following imaging with scope of interpretation limited to determining acute life threatening conditions related to emergency care: ***, which revealed ***  Cardiac monitoring was reviewed and interpreted by myself which shows ***  Treatment and Reassessment: ***  Consultation: - Consulted or discussed management/test interpretation w/ external professional: ***  Consideration for admission or further workup: Admission was considered ***  Social Determinants of health: Social History   Tobacco Use   Smoking status: Former    Years: 3.00    Types: Cigarettes    Quit date: 1958    Years since quitting: 65.9   Smokeless tobacco: Never  Vaping Use   Vaping Use: Never used  Substance Use Topics   Alcohol use: No  Drug use: No      {Document critical care time when appropriate:1} {Document review of labs and clinical decision tools ie heart score, Chads2Vasc2 etc:1}  {Document your independent review of radiology images, and any outside records:1} {Document your discussion with family members, caretakers, and with consultants:1} {Document social determinants of health affecting pt's care:1} {Document your decision making why or why not admission, treatments were needed:1} Final Clinical Impression(s) / ED Diagnoses Final diagnoses:  None    Rx / DC Orders ED Discharge Orders     None

## 2022-10-21 NOTE — Addendum Note (Signed)
Encounter addended by: Dawayne Cirri, [facility] on: 10/21/2022 11:48 PM  Actions taken: Order Reconciliation Section accessed

## 2022-10-21 NOTE — Consult Note (Signed)
Neurology Consultation  Reason for Consult: Code stroke-left-sided weakness Referring Physician: Dr. Wynona Dove  CC: Left-sided weakness  History is obtained from: Chart, and around 3 AM by the wife who arrived afterwards  HPI: Ryan Savage is a 88 y.o. male who has a significant past medical history of atrial fibrillation not on anticoagulation due to history of bilateral subdural bleeds and recurrent falls that required evacuation and he also refused Watchman device, coronary artery disease, hyperlipidemia, hypertension, obstructive sleep apnea, thrombocytopenia, prior stroke with residual right homonymous hemianopsia, as well as residual word finding difficulties-who was brought in for emergent evaluation of strokelike symptoms. Initial report by EMS was last seen normal was 11:30 PM but I was unable to reach the family at that time to confirm the last known well despite multiple calls made to the home number and cell number of the wife as well as alternate contact.   Later on the wife came in and I am glad that I did not offer him TNK because the last known well according to wife was at suppertime which was about 4 or 4:15 PM. She reports that he was early to go to bed today right after supper which is unusual for him but when she went to the room and saw him, he was sitting in the chair and not on bed at around 9:30 PM and was unable to talk normally.  He also had urinary incontinence and had a puddle of urine on the floor. EMS was called and they were concerned for stroke and brought him as a code stroke because the timing provided to them was symptom recognition and not last known well.   LKW: 4:15 PM on 10/21/2022 IV thrombolysis given?: no, outside the window as well as prior history of subdural hematomas which have been recurrent EVT: No-no ELVO Premorbid modified Rankin scale (mRS): 2   ROS: Full ROS was performed and is negative except as noted in the HPI.   Past Medical  History:  Diagnosis Date   Aortic stenosis 05/22/2018   Mild with mean AVG 96mHg by echo 05/2018   Atrial fibrillation, chronic (HCC)    Not on anticoagulation due to history of bilateral subdural bleeds due to recurrent falls   BPH (benign prostatic hypertrophy)    [name] (coronary artery disease), native coronary artery    Severe two-vessel [name] with chronically occluded LAD and RCA with widely patent left main, intermediate, and left circumflex branches.  On medical management.  He has extensive collaterals.   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 fib with RVR.  Repeat echo 05/2018 with EF 45-50%.   [name] (degenerative joint disease)    GERD (gastroesophageal reflux disease)    Hyperlipidemia    LDL goal less then 100   Hypertension    Microscopic hematuria    Dr DDiona Fanti  Mitral valve prolapse    with mild MR   OSA (obstructive sleep apnea)    Pulmonary HTN (HCC)    PASP 585mg by echo 05/2018 but normal pressures on right heart cath.   [place] [place]   SIADH (syndrome of inappropriate ADH production) ([facility]   Skin cancer    "side of my nose" (11/19/2017)   [place] [place]   "fully recovered"   [place]rombocytopenia ([facility]   Mild- platelet count 143,000 on  02/2011, stable 08/2011    Family History  Problem Relation Age of Onset   Heart failure Mother    Heart attack Father    [name] Father    Heart attack Brother    [name] Brother    Colon cancer Brother    Colon cancer Sister     Social History:   reports that he quit smoking about 65 years ago. His smoking use included cigarettes. He has never used smokeless tobacco. He reports that he does not drink alcohol and does not use drugs.  Medications No current facility-administered medications for this encounter.  Current Outpatient Medications:    acetaminophen  (TYLENOL) 325 MG tablet, Take 1-2 tablets (325-650 mg total) by mouth every 4 (four) hours as needed for mild pain., Disp: , Rfl:    amLODipine (NORVASC) 2.5 MG tablet, Take 1 tablet (2.5 mg total) by mouth every evening., Disp: 90 tablet, Rfl: 3   aspirin EC 81 MG tablet, Take 81 mg by mouth daily. Swallow whole., Disp: , Rfl:    finasteride (PROSCAR) 5 MG tablet, Take 1 tablet (5 mg total) by mouth daily., Disp: 30 tablet, Rfl: 0   Melatonin 3 MG TABS, Take 3 mg by mouth at bedtime., Disp: , Rfl:    metoprolol succinate (TOPROL-XL) 25 MG 24 hr tablet, Take 1 tablet (25 mg total) by mouth daily., Disp: 90 tablet, Rfl: 3   Multiple Vitamin (MULTIVITAMIN WITH MINERALS) TABS tablet, Take 1 tablet by mouth daily., Disp: , Rfl:    Multiple Vitamins-Minerals (PRESERVISION AREDS 2 PO), Take 1 capsule by mouth in the morning and at bedtime., Disp: , Rfl:    simvastatin (ZOCOR) 10 MG tablet, Take 1 tablet (10 mg total) by mouth every evening., Disp: 90 tablet, Rfl: 3   Cholecalciferol (VITAMIN D3) 50 MCG (2000 UT) TABS, Take 2,000 Units by mouth every other day. (Patient not taking: Reported on 10/22/2022), Disp: , Rfl:   Exam: Current vital signs: BP (!) 170/85 (BP Location: Left Arm)   Pulse 72   Temp (!) 97 F (36.1 C) (Axillary)   Resp 20   Ht '5\' 11"'$  (1.803 m)   Wt 83.8 kg   SpO2 93%   BMI 25.77 kg/m  Vital signs in last 24 hours: Temp:  [97 F (36.1 C)-97.7 F (36.5 C)] 97 F (36.1 C) (12/12 0045) Pulse Rate:  [72-119] 72 (12/12 0115) Resp:  [18-23] 20 (12/12 0115) BP: (157-170)/(81-124) 170/85 (12/12 0115) SpO2:  [91 %-93 %] 93 % (12/12 0115) Weight:  [83.8 kg] 83.8 kg (12/12 0031)  General: Somewhat lethargic but opens eyes and follows commands HEENT: Normocephalic atraumatic Lungs: Scattered Rales CVS: Irregularly irregular Abdomen nondistended nontender Neurological exam He is lethargic but opens eyes and carries on a conversation.  He is able to follow simple commands. His  speech is significantly dysarthric.  He has no evidence of aphasia. Cranial nerves: Pupils equal round react light, initially it seemed like he had a rightward gaze preference but he was able to overcome that and look to the left and later it again look like that he is preferring to the right but when you talk to him from the left, he was able to look to the left.  He is unable to consistently blink to threat from both sides-has baseline right homonymous hemianopsia.  Left lower facial weakness noticed.  Auditory acuity reduced bilaterally. Motor examination with no weakness in any of the 4 extremities although EMS reports left-sided grip weakness on scene.  Sensation: Intact to light touch Coordination difficult to assess but no gross dysmetria NIH stroke scale 1a Level of Conscious.: 0 1b LOC Questions: 0 1c LOC Commands: 0 2 Best Gaze: 1 3 Visual: 2 4 Facial Palsy: 2 5a Motor Arm - left: 0 5b Motor Arm - Right: 0 6a Motor Leg - Left: 0 6b Motor Leg - Right: 0 7 Limb Ataxia: 0 8 Sensory: 0 9 Best Language: 0 10 Dysarthria: 2 11 Extinct. and Inatten.: 0 TOTAL: 7   Labs I have reviewed labs in epic and the results pertinent to this consultation are:  CBC    Component Value Date/Time   WBC 6.7 10/21/2022 2353   RBC 3.94 (L) 10/21/2022 2353   HGB 13.3 10/22/2022 0002   HCT 39.0 10/22/2022 0002   PLT 159 10/21/2022 2353   MCV 92.9 10/21/2022 2353   MCH 31.7 10/21/2022 2353   MCHC 34.2 10/21/2022 2353   RDW 13.1 10/21/2022 2353   LYMPHSABS 2.0 10/21/2022 2353   MONOABS 1.0 10/21/2022 2353   EOSABS 0.1 10/21/2022 2353   BASOSABS 0.1 10/21/2022 2353    CMP     Component Value Date/Time   NA 126 (L) 10/22/2022 0002   K 4.2 10/22/2022 0002   CL 90 (L) 10/22/2022 0002   CO2 26 10/21/2022 2353   GLUCOSE 100 (H) 10/22/2022 0002   BUN 25 (H) 10/22/2022 0002   CREATININE 1.20 10/22/2022 0002   CALCIUM 8.9 10/21/2022 2353   PROT 7.2 10/21/2022 2353   PROT 7.1 01/12/2019 0832    ALBUMIN 3.5 10/21/2022 2353   ALBUMIN 4.3 01/12/2019 0832   AST 27 10/21/2022 2353   ALT 15 10/21/2022 2353   ALKPHOS 55 10/21/2022 2353   BILITOT 0.9 10/21/2022 2353   BILITOT 0.7 01/12/2019 0832   GFRNONAA 58 (L) 10/21/2022 2353   GFRAA >60 04/01/2018 0536   Imaging I have reviewed the images obtained:  CT-head-chronic left PCA infarct.  No acute changes.  Aspects 10 CT angio head and neck-no ELVO  Assessment:  88 year old with a past medical history that includes atrial fibrillation not on anticoagulation due to bilateral subdural bleeds and recurrent falls that required evacuation and also refused Watchman device, coronary artery disease, hyperlipidemia, hypertension, sleep apnea, thrombocytopenia, prior strokes with residual right homonymous hemianopsia as well as residual word finding difficulty brought in for emergent evaluation of left-sided weakness and strokelike symptoms. On examination he did have dysarthria left facial droop along with the previously known right M1 was hemianopsia along with waxing and waning level of consciousness. At 1 point he had rightward gaze preference but then that resolved.  No emergent LVO on CTA.  CT head with aspects 10.  Labs with hyponatremia with sodium 124-has chronic hyponatremia but this is lower than his previous sodium levels.  Differentials at this time include new stroke of cardioembolic etiology but underlying seizures should also be evaluated for given hyponatremia as well as prior strokes with cortical damage.  Not a candidate for endovascular due to no emergent LVO.  Not a candidate for TNKase due to no clear last known well and thankfully upon confirming with the wife later on, he was way outside the window-last known well somewhere around 4 or 4:15 PM and he arrived in the ER 11:15 PM.  Recommendations: Admit to hospitalist Frequent neurochecks Continue home antiplatelet MRI brain without contrast Routine EEG.  Will hold off  on antiepileptics for now. 2D echo A1c Lipid panel Telemetry PT OT Speech therapy N.p.o.  until cleared by bedside swallow evaluation or formal speech evaluation Check chest x-ray, urinalysis and treat if there is any evidence of pneumonia or UTI. Management of hyponatremia per primary team  Plan discussed with Dr. Pearline Cables.  -- Amie Portland, MD Neurologist [facility] Pager: [phone]

## 2022-10-22 ENCOUNTER — Inpatient Hospital Stay (HOSPITAL_COMMUNITY): Payer: Medicare PPO

## 2022-10-22 ENCOUNTER — Emergency Department (HOSPITAL_COMMUNITY): Payer: Medicare PPO | Admitting: Anesthesiology

## 2022-10-22 ENCOUNTER — Other Ambulatory Visit: Payer: Self-pay

## 2022-10-22 ENCOUNTER — Emergency Department (HOSPITAL_COMMUNITY): Payer: Medicare PPO

## 2022-10-22 ENCOUNTER — Encounter (HOSPITAL_COMMUNITY): Payer: Self-pay

## 2022-10-22 ENCOUNTER — Encounter (HOSPITAL_COMMUNITY)
Admission: EM | Disposition: A | Discharge status: Hospice | Payer: Self-pay | Source: Home / Self Care | Attending: Internal Medicine

## 2022-10-22 DIAGNOSIS — R4182 Altered mental status, unspecified: Secondary | ICD-10-CM | POA: Diagnosis not present

## 2022-10-22 DIAGNOSIS — I63411 Cerebral infarction due to embolism of right middle cerebral artery: Secondary | ICD-10-CM

## 2022-10-22 DIAGNOSIS — I63419 Cerebral infarction due to embolism of unspecified middle cerebral artery: Secondary | ICD-10-CM | POA: Diagnosis not present

## 2022-10-22 DIAGNOSIS — D696 Thrombocytopenia, unspecified: Secondary | ICD-10-CM | POA: Diagnosis present

## 2022-10-22 DIAGNOSIS — Z87891 Personal history of nicotine dependence: Secondary | ICD-10-CM | POA: Diagnosis not present

## 2022-10-22 DIAGNOSIS — I639 Cerebral infarction, unspecified: Secondary | ICD-10-CM | POA: Diagnosis not present

## 2022-10-22 DIAGNOSIS — G459 Transient cerebral ischemic attack, unspecified: Secondary | ICD-10-CM

## 2022-10-22 DIAGNOSIS — R29711 NIHSS score 11: Secondary | ICD-10-CM | POA: Diagnosis present

## 2022-10-22 DIAGNOSIS — I6389 Other cerebral infarction: Secondary | ICD-10-CM | POA: Diagnosis not present

## 2022-10-22 DIAGNOSIS — Z8673 Personal history of transient ischemic attack (TIA), and cerebral infarction without residual deficits: Secondary | ICD-10-CM | POA: Diagnosis not present

## 2022-10-22 DIAGNOSIS — I5022 Chronic systolic (congestive) heart failure: Secondary | ICD-10-CM | POA: Diagnosis present

## 2022-10-22 DIAGNOSIS — G8194 Hemiplegia, unspecified affecting left nondominant side: Secondary | ICD-10-CM | POA: Diagnosis present

## 2022-10-22 DIAGNOSIS — R7989 Other specified abnormal findings of blood chemistry: Secondary | ICD-10-CM | POA: Diagnosis not present

## 2022-10-22 DIAGNOSIS — Z66 Do not resuscitate: Secondary | ICD-10-CM | POA: Diagnosis present

## 2022-10-22 DIAGNOSIS — I63432 Cerebral infarction due to embolism of left posterior cerebral artery: Secondary | ICD-10-CM | POA: Diagnosis present

## 2022-10-22 DIAGNOSIS — R29810 Facial weakness: Secondary | ICD-10-CM | POA: Diagnosis present

## 2022-10-22 DIAGNOSIS — J189 Pneumonia, unspecified organism: Secondary | ICD-10-CM | POA: Diagnosis present

## 2022-10-22 DIAGNOSIS — R0609 Other forms of dyspnea: Secondary | ICD-10-CM | POA: Diagnosis not present

## 2022-10-22 DIAGNOSIS — I272 Pulmonary hypertension, unspecified: Secondary | ICD-10-CM | POA: Diagnosis present

## 2022-10-22 DIAGNOSIS — I77810 Thoracic aortic ectasia: Secondary | ICD-10-CM | POA: Diagnosis present

## 2022-10-22 DIAGNOSIS — Z1152 Encounter for screening for COVID-19: Secondary | ICD-10-CM | POA: Diagnosis not present

## 2022-10-22 DIAGNOSIS — E861 Hypovolemia: Secondary | ICD-10-CM | POA: Diagnosis present

## 2022-10-22 DIAGNOSIS — I482 Chronic atrial fibrillation, unspecified: Secondary | ICD-10-CM

## 2022-10-22 DIAGNOSIS — E785 Hyperlipidemia, unspecified: Secondary | ICD-10-CM | POA: Diagnosis present

## 2022-10-22 DIAGNOSIS — Y92239 Unspecified place in hospital as the place of occurrence of the external cause: Secondary | ICD-10-CM | POA: Diagnosis not present

## 2022-10-22 DIAGNOSIS — R1310 Dysphagia, unspecified: Secondary | ICD-10-CM | POA: Diagnosis present

## 2022-10-22 DIAGNOSIS — I110 Hypertensive heart disease with heart failure: Secondary | ICD-10-CM | POA: Diagnosis present

## 2022-10-22 DIAGNOSIS — G9341 Metabolic encephalopathy: Secondary | ICD-10-CM | POA: Diagnosis present

## 2022-10-22 DIAGNOSIS — Z79899 Other long term (current) drug therapy: Secondary | ICD-10-CM | POA: Diagnosis not present

## 2022-10-22 DIAGNOSIS — F05 Delirium due to known physiological condition: Secondary | ICD-10-CM | POA: Diagnosis not present

## 2022-10-22 DIAGNOSIS — R52 Pain, unspecified: Secondary | ICD-10-CM | POA: Diagnosis not present

## 2022-10-22 DIAGNOSIS — I69398 Other sequelae of cerebral infarction: Secondary | ICD-10-CM | POA: Diagnosis not present

## 2022-10-22 DIAGNOSIS — E871 Hypo-osmolality and hyponatremia: Secondary | ICD-10-CM | POA: Diagnosis present

## 2022-10-22 DIAGNOSIS — Z515 Encounter for palliative care: Secondary | ICD-10-CM | POA: Diagnosis not present

## 2022-10-22 LAB — URINALYSIS, ROUTINE W REFLEX MICROSCOPIC
Bacteria, UA: NONE SEEN
Bilirubin Urine: NEGATIVE
Glucose, UA: NEGATIVE mg/dL
Ketones, ur: 5 mg/dL — AB
Leukocytes,Ua: NEGATIVE
Nitrite: NEGATIVE
Protein, ur: NEGATIVE mg/dL
Specific Gravity, Urine: 1.013 (ref 1.005–1.030)
pH: 8 (ref 5.0–8.0)

## 2022-10-22 LAB — I-STAT VENOUS BLOOD GAS, ED
Acid-Base Excess: 1 mmol/L (ref 0.0–2.0)
Bicarbonate: 23.8 mmol/L (ref 20.0–28.0)
Calcium, Ion: 1 mmol/L — ABNORMAL LOW (ref 1.15–1.40)
HCT: 36 % — ABNORMAL LOW (ref 39.0–52.0)
Hemoglobin: 12.2 g/dL — ABNORMAL LOW (ref 13.0–17.0)
O2 Saturation: 87 %
Potassium: 3.7 mmol/L (ref 3.5–5.1)
Sodium: 127 mmol/L — ABNORMAL LOW (ref 135–145)
TCO2: 25 mmol/L (ref 22–32)
pCO2, Ven: 31.3 mmHg — ABNORMAL LOW (ref 44–60)
pH, Ven: 7.49 — ABNORMAL HIGH (ref 7.25–7.43)
pO2, Ven: 48 mmHg — ABNORMAL HIGH (ref 32–45)

## 2022-10-22 LAB — COMPREHENSIVE METABOLIC PANEL
ALT: 15 U/L (ref 0–44)
AST: 27 U/L (ref 15–41)
Albumin: 3.5 g/dL (ref 3.5–5.0)
Alkaline Phosphatase: 55 U/L (ref 38–126)
Anion gap: 9 (ref 5–15)
BUN: 21 mg/dL (ref 8–23)
CO2: 26 mmol/L (ref 22–32)
Calcium: 8.9 mg/dL (ref 8.9–10.3)
Chloride: 89 mmol/L — ABNORMAL LOW (ref 98–111)
Creatinine, Ser: 1.19 mg/dL (ref 0.61–1.24)
GFR, Estimated: 58 mL/min — ABNORMAL LOW (ref >60)
Glucose, Bld: 107 mg/dL — ABNORMAL HIGH (ref 70–99)
Potassium: 4 mmol/L (ref 3.5–5.1)
Sodium: 124 mmol/L — ABNORMAL LOW (ref 135–145)
Total Bilirubin: 0.9 mg/dL (ref 0.3–1.2)
Total Protein: 7.2 g/dL (ref 6.5–8.1)

## 2022-10-22 LAB — OSMOLALITY: Osmolality: 274 mOsm/kg — ABNORMAL LOW (ref 275–295)

## 2022-10-22 LAB — TROPONIN I (HIGH SENSITIVITY)
Troponin I (High Sensitivity): 10 ng/L (ref <18)
Troponin I (High Sensitivity): 46 ng/L — ABNORMAL HIGH (ref <18)
Troponin I (High Sensitivity): 246 ng/L (ref <18)

## 2022-10-22 LAB — LACTIC ACID, PLASMA
Lactic Acid, Venous: 1.8 mmol/L (ref 0.5–1.9)
Lactic Acid, Venous: 1.7 mmol/L (ref 0.5–1.9)

## 2022-10-22 LAB — CBC
HCT: 36.6 % — ABNORMAL LOW (ref 39.0–52.0)
Hemoglobin: 12.5 g/dL — ABNORMAL LOW (ref 13.0–17.0)
MCH: 31.7 pg (ref 26.0–34.0)
MCHC: 34.2 g/dL (ref 30.0–36.0)
MCV: 92.9 fL (ref 80.0–100.0)
Platelets: 159 10*3/uL (ref 150–400)
RBC: 3.94 MIL/uL — ABNORMAL LOW (ref 4.22–5.81)
RDW: 13.1 % (ref 11.5–15.5)
WBC: 6.7 10*3/uL (ref 4.0–10.5)
nRBC: 0 % (ref 0.0–0.2)

## 2022-10-22 LAB — TSH: TSH: 1.301 u[IU]/mL (ref 0.350–4.500)

## 2022-10-22 LAB — I-STAT CHEM 8, ED
BUN: 25 mg/dL — ABNORMAL HIGH (ref 8–23)
Calcium, Ion: 1.15 mmol/L (ref 1.15–1.40)
Chloride: 90 mmol/L — ABNORMAL LOW (ref 98–111)
Creatinine, Ser: 1.2 mg/dL (ref 0.61–1.24)
Glucose, Bld: 100 mg/dL — ABNORMAL HIGH (ref 70–99)
HCT: 39 % (ref 39.0–52.0)
Hemoglobin: 13.3 g/dL (ref 13.0–17.0)
Potassium: 4.2 mmol/L (ref 3.5–5.1)
Sodium: 126 mmol/L — ABNORMAL LOW (ref 135–145)
TCO2: 29 mmol/L (ref 22–32)

## 2022-10-22 LAB — DIFFERENTIAL
Abs Immature Granulocytes: 0.01 10*3/uL (ref 0.00–0.07)
Basophils Absolute: 0.1 10*3/uL (ref 0.0–0.1)
Basophils Relative: 1 %
Eosinophils Absolute: 0.1 10*3/uL (ref 0.0–0.5)
Eosinophils Relative: 2 %
Immature Granulocytes: 0 %
Lymphocytes Relative: 29 %
Lymphs Abs: 2 10*3/uL (ref 0.7–4.0)
Monocytes Absolute: 1 10*3/uL (ref 0.1–1.0)
Monocytes Relative: 15 %
Neutro Abs: 3.6 10*3/uL (ref 1.7–7.7)
Neutrophils Relative %: 53 %

## 2022-10-22 LAB — PROTIME-INR
INR: 1.2 (ref 0.8–1.2)
Prothrombin Time: 14.9 seconds (ref 11.4–15.2)

## 2022-10-22 LAB — ECHOCARDIOGRAM COMPLETE
AR max vel: 1.46 cm2
AV Area VTI: 1.32 cm2
AV Area mean vel: 1.33 cm2
AV Mean grad: 13 mmHg
AV Peak grad: 23.1 mmHg
Ao pk vel: 2.41 m/s
Height: 69 in
S' Lateral: 3.6 cm
Weight: 2772.5 oz

## 2022-10-22 LAB — HEMOGLOBIN A1C
Hgb A1c MFr Bld: 5.9 % — ABNORMAL HIGH (ref 4.8–5.6)
Mean Plasma Glucose: 123 mg/dL

## 2022-10-22 LAB — NA AND K (SODIUM & POTASSIUM), RAND UR
Potassium Urine: 19 mmol/L
Sodium, Ur: 110 mmol/L

## 2022-10-22 LAB — CK: Total CK: 105 U/L (ref 49–397)

## 2022-10-22 LAB — RESP PANEL BY RT-PCR (RSV, FLU A&B, COVID)  RVPGX2
Influenza A by PCR: NEGATIVE
Influenza B by PCR: NEGATIVE
Resp Syncytial Virus by PCR: NEGATIVE
SARS Coronavirus 2 by RT PCR: NEGATIVE

## 2022-10-22 LAB — BRAIN NATRIURETIC PEPTIDE: B Natriuretic Peptide: 418.5 pg/mL — ABNORMAL HIGH (ref 0.0–100.0)

## 2022-10-22 LAB — AMMONIA: Ammonia: 28 umol/L (ref 9–35)

## 2022-10-22 LAB — APTT: aPTT: 29 seconds (ref 24–36)

## 2022-10-22 LAB — OSMOLALITY, URINE: Osmolality, Ur: 329 mOsm/kg (ref 300–900)

## 2022-10-22 LAB — STREP PNEUMONIAE URINARY ANTIGEN: Strep Pneumo Urinary Antigen: NEGATIVE

## 2022-10-22 SURGERY — RADIOLOGY WITH ANESTHESIA
Anesthesia: General

## 2022-10-22 MED ORDER — LORAZEPAM 2 MG/ML IJ SOLN
0.5000 mg | Freq: Four times a day (QID) | INTRAMUSCULAR | Status: DC | PRN
  Administered 2022-10-22 – 2022-10-27 (×3): 0.5 mg via INTRAVENOUS
  Filled 2022-10-22 (×3): qty 1

## 2022-10-22 MED ORDER — SODIUM CHLORIDE 0.9 % IV SOLN
1.0000 g | INTRAVENOUS | Status: DC
  Administered 2022-10-23 – 2022-10-26 (×4): 1 g via INTRAVENOUS
  Filled 2022-10-22 (×4): qty 10

## 2022-10-22 MED ORDER — SODIUM CHLORIDE 0.9 % IV SOLN: INTRAVENOUS | Status: DC

## 2022-10-22 MED ORDER — LORAZEPAM 2 MG/ML IJ SOLN
0.5000 mg | Freq: Once | INTRAMUSCULAR | Status: AC
  Administered 2022-10-22: 0.5 mg via INTRAVENOUS
  Filled 2022-10-22: qty 1

## 2022-10-22 MED ORDER — SODIUM CHLORIDE 0.9 % IV SOLN
500.0000 mg | Freq: Once | INTRAVENOUS | Status: AC
  Administered 2022-10-22: 500 mg via INTRAVENOUS
  Filled 2022-10-22: qty 5

## 2022-10-22 MED ORDER — SODIUM CHLORIDE 0.9 % IV SOLN
1.0000 g | Freq: Once | INTRAVENOUS | Status: AC
  Administered 2022-10-22: 1 g via INTRAVENOUS
  Filled 2022-10-22: qty 10

## 2022-10-22 MED ORDER — ALBUTEROL SULFATE (2.5 MG/3ML) 0.083% IN NEBU: 2.5000 mg | INHALATION_SOLUTION | RESPIRATORY_TRACT | Status: DC | PRN

## 2022-10-22 MED ORDER — SODIUM CHLORIDE 0.9 % IV BOLUS
500.0000 mL | Freq: Once | INTRAVENOUS | Status: AC
  Administered 2022-10-22: 500 mL via INTRAVENOUS

## 2022-10-22 MED ORDER — ASPIRIN 81 MG PO TBEC: 81.0000 mg | DELAYED_RELEASE_TABLET | Freq: Every day | ORAL | Status: DC

## 2022-10-22 MED ORDER — IOHEXOL 350 MG/ML SOLN
75.0000 mL | Freq: Once | INTRAVENOUS | Status: AC | PRN
  Administered 2022-10-22: 75 mL via INTRAVENOUS

## 2022-10-22 MED ORDER — SODIUM CHLORIDE 0.9 % IV SOLN
500.0000 mg | INTRAVENOUS | Status: AC
  Administered 2022-10-23 – 2022-10-25 (×3): 500 mg via INTRAVENOUS
  Filled 2022-10-22 (×4): qty 5

## 2022-10-22 MED ORDER — HALOPERIDOL LACTATE 5 MG/ML IJ SOLN
2.0000 mg | Freq: Once | INTRAMUSCULAR | Status: AC
  Administered 2022-10-22: 2 mg via INTRAMUSCULAR
  Filled 2022-10-22: qty 1

## 2022-10-22 MED ORDER — ASPIRIN 300 MG RE SUPP
300.0000 mg | Freq: Every day | RECTAL | Status: DC
  Administered 2022-10-22 – 2022-10-23 (×2): 300 mg via RECTAL
  Filled 2022-10-22 (×2): qty 1

## 2022-10-22 MED ORDER — HEPARIN SODIUM (PORCINE) 5000 UNIT/ML IJ SOLN
5000.0000 [IU] | Freq: Three times a day (TID) | INTRAMUSCULAR | Status: DC
  Administered 2022-10-22 – 2022-10-30 (×24): 5000 [IU] via SUBCUTANEOUS
  Filled 2022-10-22 (×25): qty 1

## 2022-10-22 MED ORDER — STROKE: EARLY STAGES OF RECOVERY BOOK
Freq: Once | Status: AC
  Filled 2022-10-22: qty 1

## 2022-10-22 MED ORDER — DIPHENHYDRAMINE HCL 50 MG/ML IJ SOLN
12.5000 mg | Freq: Once | INTRAMUSCULAR | Status: AC
  Administered 2022-10-22: 12.5 mg via INTRAVENOUS
  Filled 2022-10-22: qty 1

## 2022-10-22 MED ORDER — ACETAMINOPHEN 325 MG PO TABS: 325.0000 mg | ORAL_TABLET | ORAL | Status: DC | PRN

## 2022-10-22 MED ORDER — SIMVASTATIN 20 MG PO TABS: 10.0000 mg | ORAL_TABLET | Freq: Every evening | ORAL | Status: DC

## 2022-10-22 NOTE — ED Notes (Signed)
3W RN Charge called re purpleman initiation; will call us back.

## 2022-10-22 NOTE — Progress Notes (Signed)
EEG complete - results pending 

## 2022-10-22 NOTE — Sepsis Progress Note (Signed)
eLink has been following this Code Sepsis.

## 2022-10-22 NOTE — Progress Notes (Signed)
PT Cancellation Note  Patient Details Name: Ryan Savage MRN: [id number] DOB: 25-Nov-1928   Cancelled Treatment:    Reason Eval/Treat Not Completed: Patient at procedure or test/unavailable Pt currently getting ECHO. Will follow.   Marguarite Arbour A Rockie Schnoor 10/22/2022, 2:58 PM Marisa Severin, PT, DPT Acute Rehabilitation Services Secure chat preferred Office [phone]

## 2022-10-22 NOTE — Progress Notes (Addendum)
STROKE TEAM PROGRESS NOTE   INTERVAL HISTORY Patient evaluated at bedside in presence of spouse, two other family members. Patient was somnolent but rouses to voice and touch. He was cooperative with the neuro exam, although would fluctuate between being asleep and awake. Deficits noted on L side. See neuro exam for more details.  Vitals:   10/22/22 0615 10/22/22 0845 10/22/22 1234 10/22/22 1251  BP: (!) 150/76 120/83 125/82   Pulse: 97 79 62   Resp: 14 (!) 21 18   Temp:   98.6 F (37 C)   TempSrc:      SpO2: 94% 98% 100%   Weight:    78.6 kg  Height:    '5\' 9"'$  (1.753 m)   CBC:  Recent Labs  Lab 10/21/22 2353 10/22/22 0002 10/22/22 0856  WBC 6.7  --   --   NEUTROABS 3.6  --   --   HGB 12.5* 13.3 12.2*  HCT 36.6* 39.0 36.0*  MCV 92.9  --   --   PLT 159  --   --    Basic Metabolic Panel:  Recent Labs  Lab 10/21/22 2353 10/22/22 0002 10/22/22 0856  NA 124* 126* 127*  K 4.0 4.2 3.7  CL 89* 90*  --   CO2 26  --   --   GLUCOSE 107* 100*  --   BUN 21 25*  --   CREATININE 1.19 1.20  --   CALCIUM 8.9  --   --    Lipid Panel: No results for input(s): "CHOL", "TRIG", "HDL", "CHOLHDL", "VLDL", "LDLCALC" in the last 168 hours. HgbA1c: No results for input(s): "HGBA1C" in the last 168 hours. Urine Drug Screen: No results for input(s): "LABOPIA", "COCAINSCRNUR", "LABBENZ", "AMPHETMU", "THCU", "LABBARB" in the last 168 hours.  Alcohol Level No results for input(s): "ETH" in the last 168 hours.  IMAGING past 24 hours EEG adult  Result Date: 10/22/2022 Derek Jack, MD     10/22/2022  1:31 PM Routine EEG Report Ryan Savage is a 88 y.o. male with a history of altered mental status who is undergoing an EEG to evaluate for seizures. Report: This EEG was acquired with electrodes placed according to the International 10-20 electrode system (including Fp1, Fp2, F3, F4, C3, C4, P3, P4, O1, O2, T3, T4, T5, T6, A1, A2, Fz, Cz, Pz). The following electrodes were missing or displaced:  none. The occipital dominant rhythm was 7 Hz. This activity is reactive to stimulation. Drowsiness was manifested by background fragmentation; deeper stages of sleep were not identified. There was focal slowing over the right hemisphere. There were no interictal epileptiform discharges. There were no electrographic seizures identified. Photic stimulation and hyperventilation were not performed. Impression and clinical correlation: This EEG was obtained while awake and drowsy and is abnormal due to: -  mild diffuse slowing indicative of global cerebral dysfunction - focal slowing over the right hemisphere indicating focal cerebral dysfunction in the region of his known R MCA stroke Epileptiform abnormalities were not seen during this recording. Su Monks, MD [facility] [phone] If 7pm- 7am, please page neurology on call as listed in [place].   MR BRAIN WO CONTRAST  Result Date: 10/22/2022 CLINICAL DATA:  Provided history: Stroke suspected. EXAM: MRI HEAD WITHOUT CONTRAST TECHNIQUE: Multiplanar, multiecho pulse sequences of the brain and surrounding structures were obtained without intravenous contrast. COMPARISON:  Non-contrast head CT and CT angiogram head/neck 10/22/2022. Brain MRI 07/17/2021. FINDINGS: Brain: Moderate generalized cerebral atrophy. Multiple acute cortically-based infarcts within the mid-to-posterior  right frontal lobe and right frontal operculum, right parietal lobe, right temporal lobe and lateral right occipital lobe (right MCA territory and right MCA/PCA watershed territory). The largest infarct within the posterior right frontal lobe measures 4.7 cm and involves the precentral gyrus. Unchanged chronic cortical/subcortical infarcts within the left insula, left frontal lobe, left parietal lobe, left occipital lobe and posteromedial left temporal lobe. Chronic blood products associated with the chronic left PCA territory infarct. Chronic lacunar infarct within the right  basal ganglia/internal capsule. Background mild multifocal T2 FLAIR hyperintense signal abnormality within the cerebral white matter, nonspecific but compatible with chronic small vessel ischemic disease. Unchanged small chronic infarcts within the bilateral cerebellar hemispheres. Unchanged chronic hemorrhage within the right cerebellar hemisphere. No evidence of an intracranial mass. No extra-axial fluid collection. No midline shift. Vascular: Maintained flow voids within the proximal large arterial vessels. Skull and upper cervical spine: Right frontal and parietal burr holes. No focal suspicious marrow lesion. Sinuses/Orbits: No mass or acute finding within the imaged orbits. Minimal mucosal thickening within the bilateral ethmoid air cells. Other: Trace fluid within the bilateral mastoid air cells. IMPRESSION: Multiple acute cortically-based infarcts within the right MCA territory and right MCA/PCA watershed territory. The largest acute infarct is located within the posterior right frontal lobe, measuring 4.7 cm and involving the right precentral gyrus. Background parenchymal atrophy, chronic small vessel ischemic disease and chronic infarcts as detailed. Chronic hemorrhage within the right cerebellar hemisphere, unchanged. Chronic blood products also present at site of the chronic left PCA territory infarct. Electronically Signed   By: Kellie Simmering D.O.   On: 10/22/2022 10:29   DG Chest Portable 1 View  Result Date: 10/22/2022 CLINICAL DATA:  Altered mental status 03/26/2018 EXAM: PORTABLE CHEST 1 VIEW COMPARISON:  None Available. FINDINGS: Cardiomegaly, vascular congestion. Interstitial and airspace opacities bilaterally, left greater than right. No visible effusions or acute bony abnormality. IMPRESSION: Cardiomegaly. Bilateral interstitial and airspace opacities, left greater than right could reflect asymmetric edema or infection. Electronically Signed   By: Rolm Baptise M.D.   On: 10/22/2022 00:51    CT ANGIO HEAD NECK W WO CM (CODE STROKE)  Result Date: 10/22/2022 CLINICAL DATA:  Code stroke EXAM: CT ANGIOGRAPHY HEAD AND NECK TECHNIQUE: Multidetector CT imaging of the head and neck was performed using the standard protocol during bolus administration of intravenous contrast. Multiplanar CT image reconstructions and MIPs were obtained to evaluate the vascular anatomy. Carotid stenosis measurements (when applicable) are obtained utilizing NASCET criteria, using the distal internal carotid diameter as the denominator. RADIATION DOSE REDUCTION: This exam was performed according to the departmental dose-optimization program which includes automated exposure control, adjustment of the mA and/or kV according to patient size and/or use of iterative reconstruction technique. CONTRAST:  80m OMNIPAQUE IOHEXOL 350 MG/ML SOLN COMPARISON:  None Available. FINDINGS: CTA NECK FINDINGS SKELETON: There is no bony spinal canal stenosis. No lytic or blastic lesion. OTHER NECK: Normal pharynx, larynx and major salivary glands. No cervical lymphadenopathy. Unremarkable thyroid gland. UPPER CHEST: No pneumothorax or pleural effusion. No nodules or masses. AORTIC ARCH: There is calcific atherosclerosis of the aortic arch. There is no aneurysm, dissection or hemodynamically significant stenosis of the visualized portion of the aorta. Conventional 3 vessel aortic branching pattern. The visualized proximal subclavian arteries are widely patent. RIGHT CAROTID SYSTEM: Normal without aneurysm, dissection or stenosis. LEFT CAROTID SYSTEM: Normal without aneurysm, dissection or stenosis. VERTEBRAL ARTERIES: Left dominant configuration. Both origins are clearly patent. There is no dissection, occlusion or flow-limiting stenosis  to the skull base (V1-V3 segments). CTA HEAD FINDINGS POSTERIOR CIRCULATION: --Vertebral arteries: Normal V4 segments. --Inferior cerebellar arteries: Normal. --Basilar artery: Normal. --Superior cerebellar  arteries: Normal. --Posterior cerebral arteries (PCA): Normal. ANTERIOR CIRCULATION: --Intracranial internal carotid arteries: Normal. --Anterior cerebral arteries (ACA): Normal. Both A1 segments are present. Patent anterior communicating artery (a-comm). --Middle cerebral arteries (MCA): Normal. VENOUS SINUSES: As permitted by contrast timing, patent. ANATOMIC VARIANTS: None Review of the MIP images confirms the above findings. IMPRESSION: No emergent large vessel occlusion or high-grade stenosis of the intracranial arteries. Aortic atherosclerosis (ICD10-I70.0). Electronically Signed   By: Ulyses Jarred M.D.   On: 10/22/2022 00:24   CT HEAD CODE STROKE WO CONTRAST  Result Date: 10/22/2022 CLINICAL DATA:  Code stroke.  Left-sided weakness and facial droop EXAM: CT HEAD WITHOUT CONTRAST TECHNIQUE: Contiguous axial images were obtained from the base of the skull through the vertex without intravenous contrast. RADIATION DOSE REDUCTION: This exam was performed according to the departmental dose-optimization program which includes automated exposure control, adjustment of the mA and/or kV according to patient size and/or use of iterative reconstruction technique. COMPARISON:  None Available. FINDINGS: Brain: There is no mass, hemorrhage or extra-axial collection. Generalized atrophy. 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 Vascular: No abnormal hyperdensity of the major intracranial arteries or dural venous sinuses. No intracranial atherosclerosis. Skull: The visualized skull base, calvarium and extracranial soft tissues are normal. Sinuses/Orbits: No fluid levels or advanced mucosal thickening of the visualized paranasal sinuses. No mastoid or middle ear effusion. The orbits are normal. ASPECTS [facility] Stroke Program Early CT Score) - Ganglionic level infarction (caudate, lentiform nuclei, internal capsule, insula, M1-M3 cortex): 7 - Supraganglionic infarction  (M4-M6 cortex): 3 Total score (0-10 with 10 being normal): 10 IMPRESSION: 1. No acute hemorrhage. 2. 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 3. ASPECTS is 10. These results were communicated to Dr. Amie Portland at 12:13 am on 10/22/2022 by text page via the [facility] messaging system. Electronically Signed   By: Ulyses Jarred M.D.   On: 10/22/2022 00:14    PHYSICAL EXAM General: in no acute distress HEENT: normocephalic and atraumatic Cardiovascular: regular rate and a-fib on monitor Respiratory: normal respiratory effort and on 3L [place] Gastrointestinal: non-tender and non-distended Extremities: moving all extremities spontaneously  Mental Status: Aurthur Downs is somnolent but rouses to voice, touch; he is oriented to person. Speech was slurred and non-fluent. He was able to follow only simple commands.  Cranial Nerves: II:  Visual fields  could not be tested III,IV, VI: no ptosis, extra-ocular motions  could not be assessed V,VII: flattening of L nasolabial fold  Limited neuro exam due to somnolence  Motor: Right : Upper extremity   5/5 full power  Lower extremity   5/5 full power  Left: Upper extremity   5/5 full power Lower extremity   5/5 full power  Tone and bulk: normal tone throughout; no atrophy noted  Gait: not observed during encounter  ASSESSMENT/PLAN Mr. Gil Ingwersen is a 88 year old with a past medical history that includes atrial fibrillation not on anticoagulation due to bilateral subdural bleeds and recurrent falls that required evacuation and also refused Watchman device, coronary artery disease, hyperlipidemia, hypertension, sleep apnea, thrombocytopenia, prior strokes with residual right homonymous hemianopsia as well as residual word finding difficulty brought in for emergent evaluation of left-sided weakness and strokelike symptoms. MRI showed multiple acute cortically-based infarcts within the right MCA territory  and right MCA/PCA watershed territory.  Stroke - scattered acute R MCA and MCA/PCA watershed infarcts, embolic due to afib not on [facility]  Code Stroke CT head shows no acute hemorrhage. 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 ASPECTS 10.   CTA head & neck shows No emergent large vessel occlusion or high-grade stenosis of the intracranial arteries. MRI  showed multiple acute cortically-based infarcts within the right MCA territory and right MCA/PCA watershed territory. Chronic hemorrhage within the right cerebellar hemisphere, unchanged. Chronic blood products also present at site of the chronic left PCA territory infarct. 2D Echo EF 55-60% EEG mild diffuse slowing, no seizure LDL pending HgbA1c pending VTE prophylaxis - heparin subcu No antithrombotic prior to admission, now on aspirin 300 mg PR.  Therapy recommendations:  pending  Disposition:  pending   Chronic A-fib On aspirin at home Not on [facility] due to history of frequent falls and SDH s/p b/l hematoma evacuation Has refused Watchman device in the past with Dr. [name] cardiology Now on aspirin 300 PR will need further discussion about [facility] with family in a couple days.  History of strokes 02/2021 left MCA punctate infarct on MRI.  MRA left ICA cervical-petrous moderate stenosis.  Carotid Doppler negative.  EF 45 to 50%, LDL 54, A1c 5.6.  Etiology likely due to A. fib not on [facility], however given patient history of ICH and SAH, discharged on aspirin 81 and Zocor 10. 07/2021 admitted for left large PCA infarct.  CTA head and neck no LVO, no left PCA occlusion.  EF 60 to 65%.  LDL 45 and A1c 5.7.  Continue on aspirin 81 and recommend follow-up with Dr. [name] for Watchman device placement.  However, patient declined Watchman device later.  Hypertension, currently normotensive Home meds:  amlodipine, held at this time Stable Long-term BP goal normotensive  Hyperlipidemia, chronic Home meds:   simvastatin 10 mg LDL pending, goal < 70 On Zocor 10 Continue statin at discharge  Other Stroke Risk Factors Advanced Age >/= 52  [name] Obstructive sleep apnea on CPAP  Other Active Problems Hyponatremia sodium 126   Hospital day # 0   ATTENDING NOTE: I reviewed above note and agree with the assessment and plan. Pt was seen and examined.   88 year old male with history of A-fib not on AC due to frequent falls with subdural hematoma status post bilateral evacuation, refused Watchman device, [name], hypertension, hyperlipidemia, OSA on CPAP, strokes admitted for aphasia and urinary incontinence.  CT no acute finding but old left PCA infarct.  CT head and neck left VA origin stenosis.  MRI showed right MCA scattered infarcts, 2D echo EF 55 to 60%, LDL and A1c pending, UA negative.  EEG no seizure.  Sodium 125, creatinine 1.20.  Etiology for patient stroke still likely due to A-fib not on [facility].  Patient had a previous SDH with bilateral hematoma evacuation, not good candidate for [facility], patient also refused Watchman device in the past.  Currently did not pass swallow, will put on aspirin 300 PR.  Will further discuss with family in a couple days regarding antithrombotic options.  Continue statin.  PT/OT.  Treat underlying other medical condition per primary team.  Will follow.  For detailed assessment and plan, please refer to above/below as I have made changes wherever appropriate.   Rosalin Hawking, MD PhD Stroke Neurology 10/22/2022 5:11 PM  I discussed with Dr. Archie Balboa. I spent extensive face-to-face time with the patient wife and daughter and  son, more than 50% of which was spent in counseling and coordination of care, reviewing test results, images and medication, and discussing the diagnosis, treatment plan and potential prognosis. This patient's care requiresreview of multiple databases, neurological assessment, discussion with family, other specialists and medical decision making of high  complexity.      To contact Stroke Continuity provider, please refer to [email or web address] After hours, contact General Neurology

## 2022-10-22 NOTE — ED Notes (Signed)
Patient attempting to get out of bed. Patient redirectable by wife and ED staff. Patient moved to another room closer to Nurse desk.

## 2022-10-22 NOTE — ED Notes (Signed)
Changed pt's brief and chux. They were soaked through with urine.  PT is very squirmy and somewhat agitated.  I have requested medicine to assist pt in remaining calm and for his MRI.

## 2022-10-22 NOTE — ED Notes (Signed)
Pt is back from MRI.

## 2022-10-22 NOTE — Progress Notes (Signed)
SLP Cancellation Note  Patient Details Name: Ryan Savage MRN: [id number] DOB: Jan 23, 1929   Cancelled treatment:       Reason Eval/Treat Not Completed: (P) Fatigue/lethargy limiting ability to participate  Pt sedated for MRI.  Will continue efforts.   Kathleen Lime, MS [facility] SLP Acute Rehab Services Office [phone] Pager [phone]   Macario Golds 10/22/2022, 10:49 AM

## 2022-10-22 NOTE — ED Notes (Signed)
Patient had a bowel movement in his brief. Patient and linens changed at this time. Patient resting.

## 2022-10-22 NOTE — ED Notes (Signed)
Agricultural consultant contacted Programme researcher, broadcasting/film/video re: [employer]

## 2022-10-22 NOTE — H&P (Addendum)
History and Physical    Faisal Skolnick ZJI:[id number] DOB: [birth year] DOA: 10/21/2022  PCP: Lajean Manes, MD  Patient coming from: home  I have personally briefly reviewed patient's old medical records in [facility]  Chief Complaint: confusion , inability to comunicate  HPI: Ryan Savage is a 88 y.o. male with medical history significant of  atrial fibrillation not on [place] due to prior hx of bilateral subdural bleeds as well as recurrent falls, of note patient offered watchman but refused. Patient also has history significant for coronary artery disease, hyperlipidemia, hypertension, obstructive sleep apnea, thrombocytopenia, CVA with residual visual deficits as well  word finding difficulties who per wife first noted around 10pm evening of presentation was found not to be himself. Per wife he was not able to communicate which was not his baseline. She also noted that he urinated on himself. Due to his hx of prior CVA she called EMS. She notes prior to this he appeared to in normal state of health except for a progressive night time cough over the last few days.  She states she last saw him well at around 4pm. Patient currently note able to assist with further history.  ED Course:  Vitals Afeb , bp 157/124, hr 90 , rr 18,  sat 91%  IN ED patient is noted to be restless and intermittently pulling at wires.  Per rn patient was medicated with  haldol/benadry  due to this.   Labs:wbc 6.7, hgb 12.5 Inr 1.2  Na 124 ( base 130), K 4, cr 1.19 ( 0.9), glu 108 CTH:  1. No acute hemorrhage. 2. 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 3. ASPECTS is 10. Cxr: ardiomegaly. Bilateral interstitial and airspace opacities, left greater than right could reflect asymmetric edema or infection. CTA: no LVO  bnP:418 Serum osmo 274 Ce10 Respiratory panel neg Lacti c 1.8  Tx ctx zithromax Review of Systems: As per HPI otherwise 10 point review  of systems negative.   Past Medical History:  Diagnosis Date   Aortic stenosis 05/22/2018   Mild with mean AVG 67mHg by echo 05/2018   Atrial fibrillation, chronic (HCC)    Not on anticoagulation due to history of bilateral subdural bleeds due to recurrent falls   BPH (benign prostatic hypertrophy)    [name] (coronary artery disease), native coronary artery    Severe two-vessel [name] with chronically occluded LAD and RCA with widely patent left main, intermediate, and left circumflex branches.  On medical management.  He has extensive collaterals.   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 fib with RVR.  Repeat echo 05/2018 with EF 45-50%.   [name] (degenerative joint disease)    GERD (gastroesophageal reflux disease)    Hyperlipidemia    LDL goal less then 100   Hypertension    Microscopic hematuria    Dr DDiona Fanti  Mitral valve prolapse    with mild MR   OSA (obstructive sleep apnea)    Pulmonary HTN (HCC)    PASP 566mg by echo 05/2018 but normal pressures on right heart cath.   SDH ([place]oma) [place]   SIADH (syndrome of inappropriate ADH production) ([place]   Skin cancer    "side of my nose" (11/19/2017)   [place]   "fully recovered"   [place]  Mild- platelet count 143,000 on 02/2011, stable 08/2011    Past Surgical History:  Procedure Laterality Date   APPENDECTOMY  1950   BACK SURGERY     BURR HOLE Bilateral 01/25/2018   Procedure: Haskell Flirt;  Surgeon: Kary Kos, MD;  Location: [facility];  Service: Neurosurgery;  Laterality: Bilateral;   BURR HOLE Left 02/20/2018   Procedure: BURR HOLES for subdural hematoma;  Surgeon: Newman Pies, MD;  Location: [facility];  Service: Neurosurgery;  Laterality: Left;   BURR HOLE Left 03/20/2018   Procedure: Craniotomy for subdural hematoma;  Surgeon: Kristeen Miss, MD;   Location: [facility];  Service: Neurosurgery;  Laterality: Left;   FOREARM FRACTURE SURGERY Right [place]   ruptured disc repair   RIGHT/LEFT HEART CATH AND CORONARY ANGIOGRAPHY N/A 11/21/2017   Procedure: RIGHT/LEFT HEART CATH AND CORONARY ANGIOGRAPHY;  Surgeon: Sherren Mocha, MD;  Location: [facility];  Service: Cardiovascular;  Laterality: N/A;   SKIN CANCER EXCISION Left    "side of my nose"     reports that he quit smoking about 65 years ago. His smoking use included cigarettes. He has never used smokeless tobacco. He reports that he does not drink alcohol and does not use drugs.  Allergies  Allergen Reactions   Methylphenidate Derivatives Other (See Comments)    Restless leg   Warfarin Sodium     Other reaction(s): subdural   Novocain [Procaine] Rash    Family History  Problem Relation Age of Onset   Heart failure Mother    Heart attack Father    [name] Father    Heart attack Brother    [name] Brother    Colon cancer Brother    Colon cancer Sister     Prior to Admission medications   Medication Sig Start Date End Date Taking? Authorizing Provider  acetaminophen (TYLENOL) 325 MG tablet Take 1-2 tablets (325-650 mg total) by mouth every 4 (four) hours as needed for mild pain. 04/03/18  Yes Love, Ivan Anchors, PA-C  amLODipine (NORVASC) 2.5 MG tablet Take 1 tablet (2.5 mg total) by mouth every evening. 03/28/21  Yes Sueanne Margarita, MD  aspirin EC 81 MG tablet Take 81 mg by mouth daily. Swallow whole.   Yes [provider]  finasteride (PROSCAR) 5 MG tablet Take 1 tablet (5 mg total) by mouth daily. 03/05/18  Yes Love, Ivan Anchors, PA-C  Melatonin 3 MG TABS Take 3 mg by mouth at bedtime.   Yes [provider]  metoprolol succinate (TOPROL-XL) 25 MG 24 hr tablet Take 1 tablet (25 mg total) by mouth daily. 03/28/21  Yes Turner, Eber Hong, MD  Multiple Vitamin (MULTIVITAMIN WITH MINERALS) TABS tablet Take 1 tablet by mouth daily. 04/04/18  Yes Love,  Ivan Anchors, PA-C  Multiple Vitamins-Minerals (PRESERVISION AREDS 2 PO) Take 1 capsule by mouth in the morning and at bedtime.   Yes [provider]  simvastatin (ZOCOR) 10 MG tablet Take 1 tablet (10 mg total) by mouth every evening. 03/28/21  Yes Turner, Eber Hong, MD  Cholecalciferol (VITAMIN D3) 50 MCG (2000 UT) TABS Take 2,000 Units by mouth every other day. Patient not taking: Reported on 10/22/2022    [provider]    Physical Exam: Vitals:   10/22/22 0045 10/22/22 0115 10/22/22 0454 10/22/22 0615  BP: (!) 170/81 (!) 170/85  (!) 150/76  Pulse: 81 72  97  Resp: (!) '23 20  14  '$ Temp: (!) 97 F (36.1  C)  98.5 F (36.9 C)   TempSrc: Axillary  Axillary   SpO2: 91% 93%  94%  Weight:      Height:        Constitutional: NAD, restless, comfortable,somnolent Vitals:   10/22/22 0045 10/22/22 0115 10/22/22 0454 10/22/22 0615  BP: (!) 170/81 (!) 170/85  (!) 150/76  Pulse: 81 72  97  Resp: (!) '23 20  14  '$ Temp: (!) 97 F (36.1 C)  98.5 F (36.9 C)   TempSrc: Axillary  Axillary   SpO2: 91% 93%  94%  Weight:      Height:       Eyes:deferred due to patient cooperation ENMT:deferred  Neck: normal, supple, no masses, no thyromegaly Respiratory: +crackles. Normal respiratory effort. No accessory muscle use.  Cardiovascular: Regular rate and rhythm, no murmurs / rubs / gallops. No extremity edema. 2+ pedal pulses.  Abdomen: no tenderness, no masses palpated. No hepatosplenomegaly. Bowel sounds positive.  Musculoskeletal: no clubbing / cyanosis. No joint deformity upper and lower extremities.  Normal muscle tone.  Skin: no rashes, lesions, ulcers. No induration Neurologic: CN 2-12 grossly intact. Sensation intact,  MAE Psychiatric: unable to assess s/p medication Labs on Admission: I have personally reviewed following labs and imaging studies  CBC: Recent Labs  Lab 10/21/22 2353 10/22/22 0002  WBC 6.7  --   NEUTROABS 3.6  --   HGB 12.5* 13.3  HCT 36.6* 39.0  MCV  92.9  --   PLT 159  --    Basic Metabolic Panel: Recent Labs  Lab 10/21/22 2353 10/22/22 0002  NA 124* 126*  K 4.0 4.2  CL 89* 90*  CO2 26  --   GLUCOSE 107* 100*  BUN 21 25*  CREATININE 1.19 1.20  CALCIUM 8.9  --    GFR: Estimated Creatinine Clearance: 42.7 mL/min (by C-G formula based on SCr of 1.2 mg/dL). Liver Function Tests: Recent Labs  Lab 10/21/22 2353  AST 27  ALT 15  ALKPHOS 55  BILITOT 0.9  PROT 7.2  ALBUMIN 3.5   No results for input(s): "LIPASE", "AMYLASE" in the last 168 hours. No results for input(s): "AMMONIA" in the last 168 hours. Coagulation Profile: Recent Labs  Lab 10/21/22 2353  INR 1.2   Cardiac Enzymes: Recent Labs  Lab 10/22/22 0218  CKTOTAL 105   BNP (last 3 results) No results for input(s): "PROBNP" in the last 8760 hours. HbA1C: No results for input(s): "HGBA1C" in the last 72 hours. CBG: Recent Labs  Lab 10/21/22 2354  GLUCAP 108*   Lipid Profile: No results for input(s): "CHOL", "HDL", "LDLCALC", "TRIG", "CHOLHDL", "LDLDIRECT" in the last 72 hours. Thyroid Function Tests: No results for input(s): "TSH", "T4TOTAL", "FREET4", "T3FREE", "THYROIDAB" in the last 72 hours. Anemia Panel: No results for input(s): "VITAMINB12", "FOLATE", "FERRITIN", "TIBC", "IRON", "RETICCTPCT" in the last 72 hours. Urine analysis:    Component Value Date/Time   COLORURINE STRAW (A) 10/22/2022 0210   APPEARANCEUR CLEAR 10/22/2022 0210   LABSPEC 1.013 10/22/2022 0210   PHURINE 8.0 10/22/2022 0210   GLUCOSEU NEGATIVE 10/22/2022 0210   HGBUR SMALL (A) 10/22/2022 0210   BILIRUBINUR NEGATIVE 10/22/2022 0210   KETONESUR 5 (A) 10/22/2022 0210   PROTEINUR NEGATIVE 10/22/2022 0210   NITRITE NEGATIVE 10/22/2022 0210   LEUKOCYTESUR NEGATIVE 10/22/2022 0210    Radiological Exams on Admission: DG Chest Portable 1 View  Result Date: 10/22/2022 CLINICAL DATA:  Altered mental status 03/26/2018 EXAM: PORTABLE CHEST 1 VIEW COMPARISON:  None Available.  FINDINGS: Cardiomegaly,  vascular congestion. Interstitial and airspace opacities bilaterally, left greater than right. No visible effusions or acute bony abnormality. IMPRESSION: Cardiomegaly. Bilateral interstitial and airspace opacities, left greater than right could reflect asymmetric edema or infection. Electronically Signed   By: Rolm Baptise M.D.   On: 10/22/2022 00:51   CT ANGIO HEAD NECK W WO CM (CODE STROKE)  Result Date: 10/22/2022 CLINICAL DATA:  Code stroke EXAM: CT ANGIOGRAPHY HEAD AND NECK TECHNIQUE: Multidetector CT imaging of the head and neck was performed using the standard protocol during bolus administration of intravenous contrast. Multiplanar CT image reconstructions and MIPs were obtained to evaluate the vascular anatomy. Carotid stenosis measurements (when applicable) are obtained utilizing NASCET criteria, using the distal internal carotid diameter as the denominator. RADIATION DOSE REDUCTION: This exam was performed according to the departmental dose-optimization program which includes automated exposure control, adjustment of the mA and/or kV according to patient size and/or use of iterative reconstruction technique. CONTRAST:  45m OMNIPAQUE IOHEXOL 350 MG/ML SOLN COMPARISON:  None Available. FINDINGS: CTA NECK FINDINGS SKELETON: There is no bony spinal canal stenosis. No lytic or blastic lesion. OTHER NECK: Normal pharynx, larynx and major salivary glands. No cervical lymphadenopathy. Unremarkable thyroid gland. UPPER CHEST: No pneumothorax or pleural effusion. No nodules or masses. AORTIC ARCH: There is calcific atherosclerosis of the aortic arch. There is no aneurysm, dissection or hemodynamically significant stenosis of the visualized portion of the aorta. Conventional 3 vessel aortic branching pattern. The visualized proximal subclavian arteries are widely patent. RIGHT CAROTID SYSTEM: Normal without aneurysm, dissection or stenosis. LEFT CAROTID SYSTEM: Normal without aneurysm,  dissection or stenosis. VERTEBRAL ARTERIES: Left dominant configuration. Both origins are clearly patent. There is no dissection, occlusion or flow-limiting stenosis to the skull base (V1-V3 segments). CTA HEAD FINDINGS POSTERIOR CIRCULATION: --Vertebral arteries: Normal V4 segments. --Inferior cerebellar arteries: Normal. --Basilar artery: Normal. --Superior cerebellar arteries: Normal. --Posterior cerebral arteries (PCA): Normal. ANTERIOR CIRCULATION: --Intracranial internal carotid arteries: Normal. --Anterior cerebral arteries (ACA): Normal. Both A1 segments are present. Patent anterior communicating artery (a-comm). --Middle cerebral arteries (MCA): Normal. VENOUS SINUSES: As permitted by contrast timing, patent. ANATOMIC VARIANTS: None Review of the MIP images confirms the above findings. IMPRESSION: No emergent large vessel occlusion or high-grade stenosis of the intracranial arteries. Aortic atherosclerosis (ICD10-I70.0). Electronically Signed   By: KUlyses JarredM.D.   On: 10/22/2022 00:24   CT HEAD CODE STROKE WO CONTRAST  Result Date: 10/22/2022 CLINICAL DATA:  Code stroke.  Left-sided weakness and facial droop EXAM: CT HEAD WITHOUT CONTRAST TECHNIQUE: Contiguous axial images were obtained from the base of the skull through the vertex without intravenous contrast. RADIATION DOSE REDUCTION: This exam was performed according to the departmental dose-optimization program which includes automated exposure control, adjustment of the mA and/or kV according to patient size and/or use of iterative reconstruction technique. COMPARISON:  None Available. FINDINGS: Brain: There is no mass, hemorrhage or extra-axial collection. Generalized atrophy. 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 Vascular: No abnormal hyperdensity of the major intracranial arteries or dural venous sinuses. No intracranial atherosclerosis. Skull: The visualized skull base, calvarium and  extracranial soft tissues are normal. Sinuses/Orbits: No fluid levels or advanced mucosal thickening of the visualized paranasal sinuses. No mastoid or middle ear effusion. The orbits are normal. ASPECTS ([facility] Early CT Score) - Ganglionic level infarction (caudate, lentiform nuclei, internal capsule, insula, M1-M3 cortex): 7 - Supraganglionic infarction (M4-M6 cortex): 3 Total score (0-10 with 10 being normal): 10 IMPRESSION:  1. No acute hemorrhage. 2. 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 3. ASPECTS is 10. These results were communicated to Dr. Amie Portland at 12:13 am on 10/22/2022 by text page via the [facility] messaging system. Electronically Signed   By: Ulyses Jarred M.D.   On: 10/22/2022 00:14    EKG: Independently reviewed. Afib cvr  Assessment/Plan  Acute Encephalopathy with associated Stroke like Symptoms  -worsening word finding difficulties  -confusion  -TIA r/o CVA - CT head/CTA negative for acute finding  - patient seen by neurology recs noted -admit progressive care  -mri pending to r/o possible cva  further cva work pending mri --neuro checks , SLP, PT/OT   HX oF SDH -not on [place] due to this   CAP -place on CAP abx  -f/u with cultures/urine ag  - pulmonary toilet   Atrial fib -not on [place] -currently  Afib with cvr  -continue bb -does not want watchman   Hypertension  -elevated on admit -permissive 24-48 hours after onset sxs or ruled out by mri   Severe 2 vessel [name] -continue bb when able and statin  -no asa due to SDH   CHF ref -appears euvolemic  -cxr with chronic effusion  -monitor closely for need for diurectics  -strict I/o daily weight   Pul HTN  OSA -not on cpap s/p cva   HLD -continue statin   Aortic root ascending Aortic dilation -continue with surveillance       DVT prophylaxis: scd Code Status: full per wife  Family Communication: wife at bedside  Kyrin, Gratz  (Spouse) [phone] ([place])  Disposition Plan:patient  expected to be admitted greater than 2 midnights  Consults called: Neurology DR Rory Percy Admission status: [progressive care   Clance Boll MD [facility]   If 7PM-7AM, please contact night-coverage [email or web address] Password [facility]  10/22/2022, 7:35 AM

## 2022-10-22 NOTE — Procedures (Signed)
Routine EEG Report  Ryan Savage is a 88 y.o. male with a history of altered mental status who is undergoing an EEG to evaluate for seizures.  Report: This EEG was acquired with electrodes placed according to the International 10-20 electrode system (including Fp1, Fp2, F3, F4, C3, C4, P3, P4, O1, O2, T3, T4, T5, T6, A1, A2, Fz, Cz, Pz). The following electrodes were missing or displaced: none.  The occipital dominant rhythm was 7 Hz. This activity is reactive to stimulation. Drowsiness was manifested by background fragmentation; deeper stages of sleep were not identified. There was focal slowing over the right hemisphere. There were no interictal epileptiform discharges. There were no electrographic seizures identified. Photic stimulation and hyperventilation were not performed.  Impression and clinical correlation: This EEG was obtained while awake and drowsy and is abnormal due to: -  mild diffuse slowing indicative of global cerebral dysfunction - focal slowing over the right hemisphere indicating focal cerebral dysfunction in the region of his known R MCA stroke  Epileptiform abnormalities were not seen during this recording.  Su Monks, MD [facility] [phone]  If 7pm- 7am, please page neurology on call as listed in [place].

## 2022-10-22 NOTE — ED Notes (Signed)
ED TO INPATIENT HANDOFF REPORT  ED Nurse Name and Phone #: Merrily Pew   S Name/Age/Gender Ryan Savage 88 y.o. male Room/Bed: 022C/022C  Code Status   Code Status: Full Code  Home/SNF/Other Home  Not oriented a this time.   Is this baseline? Mild dementia at baseline  Triage Complete: Triage complete  Chief Complaint Change in mental status [R41.82]  Triage Note Pt BIB [facility] from home w c/o of AMS. Wife states pt is normally alert and oriented. His LKW was 2130. Pt's wife states he was having slurred speech and leaning to the left. Pt has hx of strokes.   EMS VS BP 160/98 P 88 CBG 127   Allergies Allergies  Allergen Reactions   Methylphenidate Derivatives Other (See Comments)    Restless leg   Warfarin Sodium     Other reaction(s): subdural   Novocain [Procaine] Rash    Level of Care/Admitting Diagnosis ED Disposition     ED Disposition  Admit   Condition  --   [place]: [place] [[id number]]  Level of Care: Progressive [102]  Admit to Progressive based on following criteria: NEUROLOGICAL AND NEUROSURGICAL complex patients with significant risk of instability, who do not meet ICU criteria, yet require close observation or frequent assessment (< / = every 2 - 4 hours) with medical / nursing intervention.  May admit patient to [facility] or [facility] if equivalent level of care is available:: Yes  Covid Evaluation: Symptomatic Person Under Investigation (PUI) or recent exposure (last 10 days) *Testing Required*  Diagnosis: Change in mental status [182993]  Admitting Physician: Clance Boll [[id number]]  Attending Physician: Clance Boll [[id number]]  Certification:: I certify this patient will need inpatient services for at least 2 midnights          B Medical/Surgery History Past Medical History:  Diagnosis Date   Aortic stenosis 05/22/2018   Mild with mean AVG 37mHg by echo 05/2018   Atrial  fibrillation, chronic (HCC)    Not on anticoagulation due to history of bilateral subdural bleeds due to recurrent falls   BPH (benign prostatic hypertrophy)    [name] (coronary artery disease), native coronary artery    Severe two-vessel [name] with chronically occluded LAD and RCA with widely patent left main, intermediate, and left circumflex branches.  On medical management.  He has extensive collaterals.   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 fib with RVR.  Repeat echo 05/2018 with EF 45-50%.   [name] (degenerative joint disease)    GERD (gastroesophageal reflux disease)    Hyperlipidemia    LDL goal less then 100   Hypertension    Microscopic hematuria    Dr DDiona Fanti  Mitral valve prolapse    with mild MR   OSA (obstructive sleep apnea)    Pulmonary HTN (HCC)    PASP 583mg by echo 05/2018 but normal pressures on right heart cath.   SDH (subdural hematoma) ([facility]   SIADH (syndrome of inappropriate ADH production) ([facility]   Skin cancer    "side of my nose" (11/19/2017)   Stroke ([facility]2019   "fully recovered"   Thrombocytopenia ([facility]   Mild- platelet count 143,000 on 02/2011, stable 08/2011   Past Surgical History:  Procedure Laterality Date   APPENDECTOMY  1950   BACK SURGERY  BURR HOLE Bilateral 01/25/2018   Procedure: Haskell Flirt;  Surgeon: Kary Kos, MD;  Location: [facility];  Service: Neurosurgery;  Laterality: Bilateral;   BURR HOLE Left 02/20/2018   Procedure: BURR HOLES for subdural hematoma;  Surgeon: Newman Pies, MD;  Location: [facility];  Service: Neurosurgery;  Laterality: Left;   BURR HOLE Left 03/20/2018   Procedure: Craniotomy for subdural hematoma;  Surgeon: Kristeen Miss, MD;  Location: [facility];  Service: Neurosurgery;  Laterality: Left;   FOREARM FRACTURE SURGERY Right [place]   ruptured disc  repair   RIGHT/LEFT HEART CATH AND CORONARY ANGIOGRAPHY N/A 11/21/2017   Procedure: RIGHT/LEFT HEART CATH AND CORONARY ANGIOGRAPHY;  Surgeon: Sherren Mocha, MD;  Location: [facility];  Service: Cardiovascular;  Laterality: N/A;   SKIN CANCER EXCISION Left    "side of my nose"     A IV Location/Drains/Wounds Patient Lines/Drains/Airways Status     Active Line/Drains/Airways     Name Placement date Placement time Site Days   Peripheral IV 10/22/22 18 G Right Antecubital 10/22/22  0025  Antecubital  less than 1   Incision (Closed) 03/20/18 Head 03/20/18  0344  -- 1677            Intake/Output Last 24 hours  Intake/Output Summary (Last 24 hours) at 10/22/2022 1055 Last data filed at 10/22/2022 0247 Gross per 24 hour  Intake 850.17 ml  Output --  Net 850.17 ml    Labs/Imaging Results for orders placed or performed during the hospital encounter of 10/21/22 (from the past 48 hour(s))  Protime-INR     Status: None   Collection Time: 10/21/22 11:53 PM  Result Value Ref Range   Prothrombin Time 14.9 11.4 - 15.2 seconds   INR 1.2 0.8 - 1.2    Comment: (NOTE) INR goal varies based on device and disease states. Performed at [facility], [place] [address] [place], [place] [address]   APTT     Status: None   Collection Time: 10/21/22 11:53 PM  Result Value Ref Range   aPTT 29 24 - 36 seconds    Comment: Performed at [facility] [address], [place], [place] [address]  CBC     Status: Abnormal   Collection Time: 10/21/22 11:53 PM  Result Value Ref Range   WBC 6.7 4.0 - 10.5 K/uL   RBC 3.94 (L) 4.22 - 5.81 MIL/uL   Hemoglobin 12.5 (L) 13.0 - 17.0 g/dL   HCT 36.6 (L) 39.0 - 52.0 %   MCV 92.9 80.0 - 100.0 fL   MCH 31.7 26.0 - 34.0 pg   MCHC 34.2 30.0 - 36.0 g/dL   RDW 13.1 11.5 - 15.5 %   Platelets 159 150 - 400 K/uL   nRBC 0.0 0.0 - 0.2 %    Comment: Performed at [facility], [place] [address] [place],  [address]  Differential      Status: None   Collection Time: 10/21/22 11:53 PM  Result Value Ref Range   Neutrophils Relative % 53 %   Neutro Abs 3.6 1.7 - 7.7 K/uL   Lymphocytes Relative 29 %   Lymphs Abs 2.0 0.7 - 4.0 K/uL   Monocytes Relative 15 %   Monocytes Absolute 1.0 0.1 - 1.0 K/uL   Eosinophils Relative 2 %   Eosinophils Absolute 0.1 0.0 - 0.5 K/uL   Basophils Relative 1 %   Basophils Absolute 0.1 0.0 - 0.1 K/uL  Immature Granulocytes 0 %   Abs Immature Granulocytes 0.01 0.00 - 0.07 K/uL    Comment: Performed at [facility], [place] [address] [place], [place] [address]  Comprehensive metabolic panel     Status: Abnormal   Collection Time: 10/21/22 11:53 PM  Result Value Ref Range   Sodium 124 (L) 135 - 145 mmol/L   Potassium 4.0 3.5 - 5.1 mmol/L   Chloride 89 (L) 98 - 111 mmol/L   CO2 26 22 - 32 mmol/L   Glucose, Bld 107 (H) 70 - 99 mg/dL    Comment: Glucose reference range applies only to samples taken after fasting for at least 8 hours.   BUN 21 8 - 23 mg/dL   Creatinine, Ser 1.19 0.61 - 1.24 mg/dL   Calcium 8.9 8.9 - 10.3 mg/dL   Total Protein 7.2 6.5 - 8.1 g/dL   Albumin 3.5 3.5 - 5.0 g/dL   AST 27 15 - 41 U/L   ALT 15 0 - 44 U/L   Alkaline Phosphatase 55 38 - 126 U/L   Total Bilirubin 0.9 0.3 - 1.2 mg/dL   GFR, Estimated 58 (L) >60 mL/min    Comment: (NOTE) Calculated using the CKD-EPI Creatinine Equation (2021)    Anion gap 9 5 - 15    Comment: Performed at [facility] [address], [place], [place] [address]  CBG monitoring, ED     Status: Abnormal   Collection Time: 10/21/22 11:54 PM  Result Value Ref Range   Glucose-Capillary 108 (H) 70 - 99 mg/dL    Comment: Glucose reference range applies only to samples taken after fasting for at least 8 hours.  I-stat chem 8, ED     Status: Abnormal   Collection Time: 10/22/22 12:02 AM  Result Value Ref Range   Sodium 126 (L) 135 - 145 mmol/L   Potassium 4.2 3.5 - 5.1 mmol/L   Chloride 90 (L) 98 - 111 mmol/L   BUN 25 (H) 8  - 23 mg/dL   Creatinine, Ser 1.20 0.61 - 1.24 mg/dL   Glucose, Bld 100 (H) 70 - 99 mg/dL    Comment: Glucose reference range applies only to samples taken after fasting for at least 8 hours.   Calcium, Ion 1.15 1.15 - 1.40 mmol/L   TCO2 29 22 - 32 mmol/L   Hemoglobin 13.3 13.0 - 17.0 g/dL   HCT 39.0 39.0 - 52.0 %  Urinalysis, Routine w reflex microscopic     Status: Abnormal   Collection Time: 10/22/22  2:10 AM  Result Value Ref Range   Color, Urine STRAW (A) YELLOW   APPearance CLEAR CLEAR   Specific Gravity, Urine 1.013 1.005 - 1.030   pH 8.0 5.0 - 8.0   Glucose, UA NEGATIVE NEGATIVE mg/dL   Hgb urine dipstick SMALL (A) NEGATIVE   Bilirubin Urine NEGATIVE NEGATIVE   Ketones, ur 5 (A) NEGATIVE mg/dL   Protein, ur NEGATIVE NEGATIVE mg/dL   Nitrite NEGATIVE NEGATIVE   Leukocytes,Ua NEGATIVE NEGATIVE   RBC / HPF 6-10 0 - 5 RBC/hpf   WBC, UA 0-5 0 - 5 WBC/hpf   Bacteria, UA NONE SEEN NONE SEEN   Squamous Epithelial / LPF 0-5 0 - 5    Comment: Performed at [facility], [address] [place], [place] [address]  Osmolality, urine     Status: None   Collection Time: 10/22/22  2:10 AM  Result Value Ref Range   Osmolality, Ur 329 300 -  900 mOsm/kg    Comment: Performed at [facility], [address] [place], [place] [address]  Na and K (sodium & potassium), rand urine     Status: None   Collection Time: 10/22/22  2:10 AM  Result Value Ref Range   Sodium, Ur 110 mmol/L   Potassium Urine 19 mmol/L    Comment: Performed at [facility] [address], [place], [place] [address]  Strep pneumoniae urinary antigen     Status: None   Collection Time: 10/22/22  2:10 AM  Result Value Ref Range   Strep Pneumo Urinary Antigen NEGATIVE NEGATIVE    Comment:        Infection due to S. pneumoniae cannot be absolutely ruled out since the antigen present may be below the detection limit of the test. Performed at [facility], [address], [place],  [place] [address]   CK     Status: None   Collection Time: 10/22/22  2:18 AM  Result Value Ref Range   Total CK 105 49 - 397 U/L    Comment: Performed at [facility], [place] [address] [place], [place] [address]  Troponin I (High Sensitivity)     Status: None   Collection Time: 10/22/22  2:18 AM  Result Value Ref Range   Troponin I (High Sensitivity) 10 <18 ng/L    Comment: (NOTE) Elevated high sensitivity troponin I (hsTnI) values and significant  changes across serial measurements may suggest ACS but many other  chronic and acute conditions are known to elevate hsTnI results.  Refer to the "Links" section for chest pain algorithms and additional  guidance. Performed at [facility], [place] [address] [place], [place] [address]   Osmolality     Status: Abnormal   Collection Time: 10/22/22  2:18 AM  Result Value Ref Range   Osmolality 274 (L) 275 - 295 mOsm/kg    Comment: REPEATED TO VERIFY Performed at [facility], [address] [place], [place] [address]   Brain natriuretic peptide     Status: Abnormal   Collection Time: 10/22/22  2:18 AM  Result Value Ref Range   B Natriuretic Peptide 418.5 (H) 0.0 - 100.0 pg/mL    Comment: Performed at [facility] [address], [place],  [address]  Resp panel by RT-PCR (RSV, Flu A&B, Covid) Anterior Nasal Swab     Status: None   Collection Time: 10/22/22  2:19 AM   Specimen: Anterior Nasal Swab  Result Value Ref Range   SARS Coronavirus 2 by RT PCR NEGATIVE NEGATIVE    Comment: (NOTE) SARS-CoV-2 target nucleic acids are NOT DETECTED.  The SARS-CoV-2 RNA is generally detectable in upper respiratory specimens during the acute phase of infection. The lowest concentration of SARS-CoV-2 viral copies this assay can detect is 138 copies/mL. A negative result does not preclude SARS-Cov-2 infection and should not be used as the sole basis for treatment or other patient management decisions. A negative result may  occur with  improper specimen collection/handling, submission of specimen other than nasopharyngeal swab, presence of viral mutation(s) within the areas targeted by this assay, and inadequate number of viral copies(<138 copies/mL). A negative result must be combined with clinical observations, patient history, and epidemiological information. The expected result is Negative.  Fact Sheet for Patients:  EntrepreneurPulse.com.au  Fact Sheet for Healthcare Providers:  IncredibleEmployment.be  This test is no t yet approved or cleared by the [place] FDA  and  has been authorized for detection and/or diagnosis of SARS-CoV-2 by FDA under an Emergency Use Authorization (EUA). This EUA will remain  in effect (meaning this test can be used) for the duration of the COVID-19 declaration under Section 564(b)(1) of the Act, 21 U.S.C.section 360bbb-3(b)(1), unless the authorization is terminated  or revoked sooner.       Influenza A by PCR NEGATIVE NEGATIVE   Influenza B by PCR NEGATIVE NEGATIVE    Comment: (NOTE) The Xpert Xpress SARS-CoV-2/FLU/RSV plus assay is intended as an aid in the diagnosis of influenza from Nasopharyngeal swab specimens and should not be used as a sole basis for treatment. Nasal washings and aspirates are unacceptable for Xpert Xpress SARS-CoV-2/FLU/RSV testing.  Fact Sheet for Patients: EntrepreneurPulse.com.au  Fact Sheet for Healthcare Providers: IncredibleEmployment.be  This test is not yet approved or cleared by the [place] FDA and has been authorized for detection and/or diagnosis of SARS-CoV-2 by FDA under an Emergency Use Authorization (EUA). This EUA will remain in effect (meaning this test can be used) for the duration of the COVID-19 declaration under Section 564(b)(1) of the Act, 21 U.S.C. section 360bbb-3(b)(1), unless the authorization is terminated or revoked.      Resp Syncytial Virus by PCR NEGATIVE NEGATIVE    Comment: (NOTE) Fact Sheet for Patients: EntrepreneurPulse.com.au  Fact Sheet for Healthcare Providers: IncredibleEmployment.be  This test is not yet approved or cleared by the [place] FDA and has been authorized for detection and/or diagnosis of SARS-CoV-2 by FDA under an Emergency Use Authorization (EUA). This EUA will remain in effect (meaning this test can be used) for the duration of the COVID-19 declaration under Section 564(b)(1) of the Act, 21 U.S.C. section 360bbb-3(b)(1), unless the authorization is terminated or revoked.  Performed at [facility], [place] [address], [place], [place] [address]   Lactic acid, plasma     Status: None   Collection Time: 10/22/22  3:05 AM  Result Value Ref Range   Lactic Acid, Venous 1.8 0.5 - 1.9 mmol/L    Comment: Performed at [facility] [address], [place], [place] [address]  Troponin I (High Sensitivity)     Status: Abnormal   Collection Time: 10/22/22  6:33 AM  Result Value Ref Range   Troponin I (High Sensitivity) 46 (H) <18 ng/L    Comment: RESULT CALLED TO, READ BACK BY AND VERIFIED WITH J.Elizebeth Kluesner,RN 0757 10/22/22 CLARK,S (NOTE) Elevated high sensitivity troponin I (hsTnI) values and significant  changes across serial measurements may suggest ACS but many other  chronic and acute conditions are known to elevate hsTnI results.  Refer to the "Links" section for chest pain algorithms and additional  guidance. Performed at [facility], [place] [address], [place], [place] [address]   Lactic acid, plasma     Status: None   Collection Time: 10/22/22  6:33 AM  Result Value Ref Range   Lactic Acid, Venous 1.7 0.5 - 1.9 mmol/L    Comment: Performed at [facility] [address], [place], [place] [address]  Ammonia     Status: None   Collection Time: 10/22/22  8:50 AM  Result Value Ref Range   Ammonia 28 9 - 35 umol/L     Comment: HEMOLYSIS AT THIS LEVEL MAY AFFECT RESULT Performed at [facility], [place] [address] [place], [place] [address]   I-Stat venous blood gas, [facility], [facility], [facility])     Status: Abnormal  Collection Time: 10/22/22  8:56 AM  Result Value Ref Range   pH, Ven 7.490 (H) 7.25 - 7.43   pCO2, Ven 31.3 (L) 44 - 60 mmHg   pO2, Ven 48 (H) 32 - 45 mmHg   Bicarbonate 23.8 20.0 - 28.0 mmol/L   TCO2 25 22 - 32 mmol/L   O2 Saturation 87 %   Acid-Base Excess 1.0 0.0 - 2.0 mmol/L   Sodium 127 (L) 135 - 145 mmol/L   Potassium 3.7 3.5 - 5.1 mmol/L   Calcium, Ion 1.00 (L) 1.15 - 1.40 mmol/L   HCT 36.0 (L) 39.0 - 52.0 %   Hemoglobin 12.2 (L) 13.0 - 17.0 g/dL   Sample type VENOUS    MR BRAIN WO CONTRAST  Result Date: 10/22/2022 CLINICAL DATA:  Provided history: Stroke suspected. EXAM: MRI HEAD WITHOUT CONTRAST TECHNIQUE: Multiplanar, multiecho pulse sequences of the brain and surrounding structures were obtained without intravenous contrast. COMPARISON:  Non-contrast head CT and CT angiogram head/neck 10/22/2022. Brain MRI 07/17/2021. FINDINGS: Brain: Moderate generalized cerebral atrophy. Multiple acute cortically-based infarcts within the mid-to-posterior right frontal lobe and right frontal operculum, right parietal lobe, right temporal lobe and lateral right occipital lobe (right MCA territory and right MCA/PCA watershed territory). The largest infarct within the posterior right frontal lobe measures 4.7 cm and involves the precentral gyrus. Unchanged chronic cortical/subcortical infarcts within the left insula, left frontal lobe, left parietal lobe, left occipital lobe and posteromedial left temporal lobe. Chronic blood products associated with the chronic left PCA territory infarct. Chronic lacunar infarct within the right basal ganglia/internal capsule. Background mild multifocal T2 FLAIR hyperintense signal abnormality within the cerebral white matter, nonspecific but compatible with chronic  small vessel ischemic disease. Unchanged small chronic infarcts within the bilateral cerebellar hemispheres. Unchanged chronic hemorrhage within the right cerebellar hemisphere. No evidence of an intracranial mass. No extra-axial fluid collection. No midline shift. Vascular: Maintained flow voids within the proximal large arterial vessels. Skull and upper cervical spine: Right frontal and parietal burr holes. No focal suspicious marrow lesion. Sinuses/Orbits: No mass or acute finding within the imaged orbits. Minimal mucosal thickening within the bilateral ethmoid air cells. Other: Trace fluid within the bilateral mastoid air cells. IMPRESSION: Multiple acute cortically-based infarcts within the right MCA territory and right MCA/PCA watershed territory. The largest acute infarct is located within the posterior right frontal lobe, measuring 4.7 cm and involving the right precentral gyrus. Background parenchymal atrophy, chronic small vessel ischemic disease and chronic infarcts as detailed. Chronic hemorrhage within the right cerebellar hemisphere, unchanged. Chronic blood products also present at site of the chronic left PCA territory infarct. Electronically Signed   By: Kellie Simmering D.O.   On: 10/22/2022 10:29   DG Chest Portable 1 View  Result Date: 10/22/2022 CLINICAL DATA:  Altered mental status 03/26/2018 EXAM: PORTABLE CHEST 1 VIEW COMPARISON:  None Available. FINDINGS: Cardiomegaly, vascular congestion. Interstitial and airspace opacities bilaterally, left greater than right. No visible effusions or acute bony abnormality. IMPRESSION: Cardiomegaly. Bilateral interstitial and airspace opacities, left greater than right could reflect asymmetric edema or infection. Electronically Signed   By: Rolm Baptise M.D.   On: 10/22/2022 00:51   CT ANGIO HEAD NECK W WO CM (CODE STROKE)  Result Date: 10/22/2022 CLINICAL DATA:  Code stroke EXAM: CT ANGIOGRAPHY HEAD AND NECK TECHNIQUE: Multidetector CT imaging of  the head and neck was performed using the standard protocol during bolus administration of intravenous contrast. Multiplanar CT image reconstructions and MIPs were obtained to evaluate the  vascular anatomy. Carotid stenosis measurements (when applicable) are obtained utilizing NASCET criteria, using the distal internal carotid diameter as the denominator. RADIATION DOSE REDUCTION: This exam was performed according to the departmental dose-optimization program which includes automated exposure control, adjustment of the mA and/or kV according to patient size and/or use of iterative reconstruction technique. CONTRAST:  69m OMNIPAQUE IOHEXOL 350 MG/ML SOLN COMPARISON:  None Available. FINDINGS: CTA NECK FINDINGS SKELETON: There is no bony spinal canal stenosis. No lytic or blastic lesion. OTHER NECK: Normal pharynx, larynx and major salivary glands. No cervical lymphadenopathy. Unremarkable thyroid gland. UPPER CHEST: No pneumothorax or pleural effusion. No nodules or masses. AORTIC ARCH: There is calcific atherosclerosis of the aortic arch. There is no aneurysm, dissection or hemodynamically significant stenosis of the visualized portion of the aorta. Conventional 3 vessel aortic branching pattern. The visualized proximal subclavian arteries are widely patent. RIGHT CAROTID SYSTEM: Normal without aneurysm, dissection or stenosis. LEFT CAROTID SYSTEM: Normal without aneurysm, dissection or stenosis. VERTEBRAL ARTERIES: Left dominant configuration. Both origins are clearly patent. There is no dissection, occlusion or flow-limiting stenosis to the skull base (V1-V3 segments). CTA HEAD FINDINGS POSTERIOR CIRCULATION: --Vertebral arteries: Normal V4 segments. --Inferior cerebellar arteries: Normal. --Basilar artery: Normal. --Superior cerebellar arteries: Normal. --Posterior cerebral arteries (PCA): Normal. ANTERIOR CIRCULATION: --Intracranial internal carotid arteries: Normal. --Anterior cerebral arteries (ACA): Normal.  Both A1 segments are present. Patent anterior communicating artery (a-comm). --Middle cerebral arteries (MCA): Normal. VENOUS SINUSES: As permitted by contrast timing, patent. ANATOMIC VARIANTS: None Review of the MIP images confirms the above findings. IMPRESSION: No emergent large vessel occlusion or high-grade stenosis of the intracranial arteries. Aortic atherosclerosis (ICD10-I70.0). Electronically Signed   By: KUlyses JarredM.D.   On: 10/22/2022 00:24   CT HEAD CODE STROKE WO CONTRAST  Result Date: 10/22/2022 CLINICAL DATA:  Code stroke.  Left-sided weakness and facial droop EXAM: CT HEAD WITHOUT CONTRAST TECHNIQUE: Contiguous axial images were obtained from the base of the skull through the vertex without intravenous contrast. RADIATION DOSE REDUCTION: This exam was performed according to the departmental dose-optimization program which includes automated exposure control, adjustment of the mA and/or kV according to patient size and/or use of iterative reconstruction technique. COMPARISON:  None Available. FINDINGS: Brain: There is no mass, hemorrhage or extra-axial collection. Generalized atrophy. 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 Vascular: No abnormal hyperdensity of the major intracranial arteries or dural venous sinuses. No intracranial atherosclerosis. Skull: The visualized skull base, calvarium and extracranial soft tissues are normal. Sinuses/Orbits: No fluid levels or advanced mucosal thickening of the visualized paranasal sinuses. No mastoid or middle ear effusion. The orbits are normal. ASPECTS ([facility]) - Ganglionic level infarction (caudate, lentiform nuclei, internal capsule, insula, M1-M3 cortex): 7 - Supraganglionic infarction (M4-M6 cortex): 3 Total score (0-10 with 10 being normal): 10 IMPRESSION: 1. No acute hemorrhage. 2. Old left PCA territory infarct and multiple old small vessel infarcts of the  cerebellum. 3. ASPECTS is 10. These results were communicated to Dr. AAmie Portlandat 12:13 am on 10/22/2022 by text page via the [facility] Cent[facility]essaging system. Electronically Signed   By: KUlyses JarredM.D.   On: 10/22/2022 00:14    Pending Labs Unresulted Labs (From admission, onward)     Start     Ordered   10/23/22 0500  Hemoglobin A1c  Tomorrow morning,   R        10/22/22 0723   10/23/22 0500  Lipid panel  Tomorrow morning,   R        10/22/22 0723   10/23/22 0500  CBC  Tomorrow morning,   R        10/22/22 0813   10/23/22 0500  Comprehensive metabolic panel  Tomorrow morning,   R        10/22/22 0813   10/22/22 0812  Hemoglobin A1c  Once,   R        10/22/22 0813   10/22/22 0812  TSH  Once,   R        10/22/22 0813   10/22/22 0811  Blood gas, venous  Once,   R        10/22/22 0813   10/22/22 0811  Expectorated Sputum Assessment w Gram Stain, Rflx to Resp Cult  Once,   R        10/22/22 0813   10/22/22 0811  Urine Culture  (Urine Culture)  Once,   R       Question:  Indication  Answer:  Altered mental status (if no other cause identified)   10/22/22 0813   10/22/22 0809  Legionella Pneumophila Serogp 1 Ur Ag  (COPD / Pneumonia / Cellulitis / Lower Extremity Wound)  Once,   R        10/22/22 0813   10/22/22 0809  Expectorated Sputum Assessment w Gram Stain, Rflx to Resp Cult  (COPD / Pneumonia / Cellulitis / Lower Extremity Wound)  Once,   R        10/22/22 0813   10/22/22 0223  Blood Culture (routine x 2)  (Septic presentation on arrival (screening labs, nursing and treatment orders for obvious sepsis))  BLOOD CULTURE X 2,   STAT      10/22/22 0225   10/22/22 0048  Urea nitrogen, urine  Once,   URGENT        10/22/22 0047   10/22/22 0048  Uric acid, random urine  Once,   URGENT        10/22/22 0047   10/21/22 2353  Ethanol  (Stroke Panel (PNL))  Once,   URGENT        10/21/22 2352            Vitals/Pain Today's Vitals   10/22/22 0115 10/22/22 0454 10/22/22 0615 10/22/22  0845  BP: (!) 170/85  (!) 150/76 120/83  Pulse: 72  97 79  Resp: 20  14 (!) 21  Temp:  98.5 F (36.9 C)    TempSrc:  Axillary    SpO2: 93%  94% 98%  Weight:      Height:      PainSc:        Isolation Precautions No active isolations  Medications Medications  cefTRIAXone (ROCEPHIN) 1 g in sodium chloride 0.9 % 100 mL IVPB (has no administration in time range)  azithromycin (ZITHROMAX) 500 mg in sodium chloride 0.9 % 250 mL IVPB (has no administration in time range)  acetaminophen (TYLENOL) tablet 325-650 mg (has no administration in time range)  aspirin EC tablet 81 mg (0 mg Oral Hold 10/22/22 1013)  simvastatin (ZOCOR) tablet 10 mg (has no administration in time range)   stroke: early stages of recovery book (has no administration in time range)  albuterol (PROVENTIL) (2.5 MG/3ML) 0.083% nebulizer solution 2.5 mg (has no administration in time range)  sodium chloride flush (NS) 0.9 % injection 3 mL (3 mLs Intravenous Given 10/22/22 0116)  iohexol (OMNIPAQUE) 350 MG/ML injection 75 mL (75 mLs Intravenous Contrast Given  10/22/22 0004)  sodium chloride 0.9 % bolus 500 mL (0 mLs Intravenous Stopped 10/22/22 0240)  cefTRIAXone (ROCEPHIN) 1 g in sodium chloride 0.9 % 100 mL IVPB (0 g Intravenous Stopped 10/22/22 0153)  azithromycin (ZITHROMAX) 500 mg in sodium chloride 0.9 % 250 mL IVPB (0 mg Intravenous Stopped 10/22/22 0247)  haloperidol lactate (HALDOL) injection 2 mg (2 mg Intramuscular Given 10/22/22 0452)  diphenhydrAMINE (BENADRYL) injection 12.5 mg (12.5 mg Intravenous Given 10/22/22 0451)  LORazepam (ATIVAN) injection 0.5 mg (0.5 mg Intravenous Given 10/22/22 0848)    Mobility non-ambulatory     Focused Assessments Neuro Assessment Handoff:  Swallow screen pass? No    NIH Stroke Scale ( + Modified Stroke Scale Criteria)  Interval: Shift assessment Level of Consciousness (1a.)   : Alert, keenly responsive LOC Questions (1b. )   +: Answers neither question  correctly LOC Commands (1c. )   + : Performs neither task correctly Best Gaze (2. )  +: Partial gaze palsy Visual (3. )  +: Complete hemianopia Facial Palsy (4. )    : Partial paralysis  Motor Arm, Left (5a. )   +: No drift Motor Arm, Right (5b. )   +: No drift Motor Leg, Left (6a. )   +: No drift Motor Leg, Right (6b. )   +: No drift Limb Ataxia (7. ): Absent Sensory (8. )   +: Normal, no sensory loss Best Language (9. )   +: No aphasia Dysarthria (10. ): Severe dysarthria, patient's speech is so slurred as to be unintelligible in the absence of or out of proportion to any dysphasia, or is mute/anarthric Extinction/Inattention (11.)   +: No Abnormality Modified SS Total  +: 7 Complete NIHSS TOTAL: 11 Last date known well: 10/21/22 Last time known well: 2130 Neuro Assessment: Exceptions to WDL Neuro Checks:   Initial (10/22/22 0000)  Last Documented NIHSS Modified Score: 7 (10/22/22 1021) Has TPA been given? No If patient is a Neuro Trauma and patient is going to OR before floor call report to [facility] nurse: [phone] or [phone]   R Recommendations: See Admitting Provider Note  Report given to:   Additional Notes:

## 2022-10-22 NOTE — ED Notes (Signed)
Patient transported to MRI 

## 2022-10-22 NOTE — ED Triage Notes (Signed)
Pt BIB [facility] from home w c/o of AMS. Wife states pt is normally alert and oriented. His LKW was 2130. Pt's wife states he was having slurred speech and leaning to the left. Pt has hx of strokes.   EMS VS BP 160/98 P 88 CBG 127

## 2022-10-22 NOTE — ED Notes (Signed)
Contacted floor to ensure correct RN receiving.  RN will look at message and respond asap.

## 2022-10-22 NOTE — Code Documentation (Signed)
Stroke Response Nurse Documentation Code Documentation  Ryan Savage is a 88 y.o. male arriving to [facility]  via [facility] on 12/11 with past medical hx of HTN, SDH, HLD, CHF. On No antithrombotic. Code stroke was activated by EMS.   Patient from home where he was LKW at 2130 and now complaining of slurred speech and facial droop.   Stroke team at the bedside on patient arrival. Labs drawn and patient cleared for CT by Dr. Pearline Cables. Patient to CT with team. NIHSS 3, see documentation for details and code stroke times. Patient with right gaze preference , right facial droop, and dysarthria  on exam. The following imaging was completed:  CT Head and CTA. Patient is not a candidate for IV Thrombolytic due to outside of window and previous SDH. Patient is not a candidate for IR due to No LVO.    Bedside handoff with ED RN Joya.    Madelynn Done  Rapid Response RN

## 2022-10-22 NOTE — ED Notes (Signed)
Patient actively grinding his teeth. Patient does not follow commands.

## 2022-10-22 NOTE — ED Notes (Signed)
Patient bed and linens changed. Patient had a loose stool bowel movement. Patient placed in a new brief at this time.

## 2022-10-22 NOTE — Hospital Course (Addendum)
88 year old man PMH including atrial fibrillation not on anticoagulation secondary to history of bilateral subdural bleeds, recurrent falls, stroke who presented with difficulty communicating.  Progressive cough for last few days.  Admitted for strokelike symptoms.

## 2022-10-22 NOTE — Plan of Care (Signed)
  Problem: Activity: Goal: Ability to tolerate increased activity will improve Outcome: Progressing   Problem: Respiratory: Goal: Ability to maintain adequate ventilation will improve Outcome: Progressing Goal: Ability to maintain a clear airway will improve Outcome: Progressing   Problem: Nutrition: Goal: Risk of aspiration will decrease Outcome: Progressing

## 2022-10-22 NOTE — Progress Notes (Signed)
  Progress Note   Patient: Ryan Savage MOQ:[id number] DOB: 1929/05/29 DOA: 10/21/2022     0 DOS: the patient was seen and examined on 10/22/2022   Brief hospital course: 88 year old man PMH including atrial fibrillation not on anticoagulation secondary to history of bilateral subdural bleeds, recurrent falls, stroke who presented with difficulty communicating.  Progressive cough for last few days.  Admitted for strokelike symptoms.  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 Thought to be secondary to cardioembolic phenomenon from atrial fibrillation Management per neurology May need Cortrak if not improved tomorrow. Not currently awake or following commands.  Hyponatremia Chronic in nature.  No further evaluation suggested.  Elevated troponin Significance unclear.  First was normal second was elevated.  Repeat pending.  No history available.  EKG nonacute.  Possible pneumonia Favor short course of antibiotics.  No hypoxia.  Respiratory status appears stable.   Atrial fibrillation not on anticoagulation secondary to history of subdural hematomas Declined watchman. Currently rate controlled.  Was on Toprol-XL as an outpatient.  Schedule IV metoprolol if needed.   Essential hypertension Stable.  Permissive hypertension for now.   Chronic systolic CHF. Last echo 01/2022 LVEF 45-50%, regional WMA, moderate to severe mitral regurgitation. Stable.   PMH subdural hematoma, not on anticoagulation     Subjective:  Pt not able to offer history. Per son pt has had gradual cognitive decline after multiple strokes and brain bleed. Has not been responsive today.  Physical Exam: Vitals:   10/22/22 0845 10/22/22 1234 10/22/22 1251 10/22/22 1626  BP: 120/83 125/82  120/63  Pulse: 79 62  63  Resp: (!) '21 18  20  '$ Temp:  98.6 F (37 C)  98.1 F (36.7 C)  TempSrc:  Oral  Axillary   SpO2: 98% 100%  98%  Weight:   78.6 kg   Height:   '5\' 9"'$  (1.753 m)    Physical Exam Vitals reviewed.  Constitutional:      General: He is not in acute distress.    Appearance: He is ill-appearing. He is not toxic-appearing.     Comments: Confused, little response  Cardiovascular:     Rate and Rhythm: Normal rate and regular rhythm.     Heart sounds: No murmur heard. Pulmonary:     Effort: Pulmonary effort is normal. No respiratory distress.     Breath sounds: No wheezing, rhonchi or rales.  Musculoskeletal:     Comments: Tone all extremities appears grossly normal Moves all extremities  Neurological:     Comments: Cannot test except as documented  Psychiatric:     Comments: Cannot assess     Data Reviewed: Na+ 127, stable, chronic Ammonia WNL Troponin 10 > 46 Hgb stable 12.2 TSH WNL  Family Communication: son, wife, daughter-in-law at bedside  Disposition: Status is: Inpatient Remains inpatient appropriate because: acute stroke  Planned Discharge Destination:  TBD    Time spent: 35 minutes  Author: Murray Hodgkins, MD 10/22/2022 4:43 PM  For on call review [email or web address].

## 2022-10-22 NOTE — Progress Notes (Incomplete)
Echocardiogram 2D Echocardiogram has been performed.  Ryan Savage 10/22/2022, 3:35 PM

## 2022-10-23 ENCOUNTER — Inpatient Hospital Stay (HOSPITAL_COMMUNITY): Payer: Medicare PPO

## 2022-10-23 DIAGNOSIS — R7989 Other specified abnormal findings of blood chemistry: Secondary | ICD-10-CM | POA: Diagnosis not present

## 2022-10-23 DIAGNOSIS — Z8673 Personal history of transient ischemic attack (TIA), and cerebral infarction without residual deficits: Secondary | ICD-10-CM | POA: Diagnosis not present

## 2022-10-23 DIAGNOSIS — I482 Chronic atrial fibrillation, unspecified: Secondary | ICD-10-CM | POA: Diagnosis not present

## 2022-10-23 DIAGNOSIS — I63411 Cerebral infarction due to embolism of right middle cerebral artery: Secondary | ICD-10-CM | POA: Diagnosis not present

## 2022-10-23 DIAGNOSIS — E871 Hypo-osmolality and hyponatremia: Secondary | ICD-10-CM

## 2022-10-23 DIAGNOSIS — G9341 Metabolic encephalopathy: Secondary | ICD-10-CM | POA: Diagnosis not present

## 2022-10-23 LAB — CBC
HCT: 32.6 % — ABNORMAL LOW (ref 39.0–52.0)
Hemoglobin: 11.4 g/dL — ABNORMAL LOW (ref 13.0–17.0)
MCH: 31.4 pg (ref 26.0–34.0)
MCHC: 35 g/dL (ref 30.0–36.0)
MCV: 89.8 fL (ref 80.0–100.0)
Platelets: 143 10*3/uL — ABNORMAL LOW (ref 150–400)
RBC: 3.63 MIL/uL — ABNORMAL LOW (ref 4.22–5.81)
RDW: 12.9 % (ref 11.5–15.5)
WBC: 6.8 10*3/uL (ref 4.0–10.5)
nRBC: 0 % (ref 0.0–0.2)

## 2022-10-23 LAB — COMPREHENSIVE METABOLIC PANEL
ALT: 15 U/L (ref 0–44)
AST: 41 U/L (ref 15–41)
Albumin: 2.9 g/dL — ABNORMAL LOW (ref 3.5–5.0)
Alkaline Phosphatase: 46 U/L (ref 38–126)
Anion gap: 9 (ref 5–15)
BUN: 13 mg/dL (ref 8–23)
CO2: 22 mmol/L (ref 22–32)
Calcium: 8.2 mg/dL — ABNORMAL LOW (ref 8.9–10.3)
Chloride: 96 mmol/L — ABNORMAL LOW (ref 98–111)
Creatinine, Ser: 0.91 mg/dL (ref 0.61–1.24)
GFR, Estimated: >60 mL/min (ref >60)
Glucose, Bld: 88 mg/dL (ref 70–99)
Potassium: 3.3 mmol/L — ABNORMAL LOW (ref 3.5–5.1)
Sodium: 127 mmol/L — ABNORMAL LOW (ref 135–145)
Total Bilirubin: 1.2 mg/dL (ref 0.3–1.2)
Total Protein: 5.8 g/dL — ABNORMAL LOW (ref 6.5–8.1)

## 2022-10-23 LAB — GLUCOSE, CAPILLARY
Glucose-Capillary: 115 mg/dL — ABNORMAL HIGH (ref 70–99)
Glucose-Capillary: 141 mg/dL — ABNORMAL HIGH (ref 70–99)
Glucose-Capillary: 80 mg/dL (ref 70–99)
Glucose-Capillary: 98 mg/dL (ref 70–99)

## 2022-10-23 LAB — LIPID PANEL
Cholesterol: 97 mg/dL (ref 0–200)
HDL: 39 mg/dL — ABNORMAL LOW (ref >40)
LDL Cholesterol: 50 mg/dL (ref 0–99)
Total CHOL/HDL Ratio: 2.5 RATIO
Triglycerides: 39 mg/dL (ref <150)
VLDL: 8 mg/dL (ref 0–40)

## 2022-10-23 LAB — URINE CULTURE: Culture: NO GROWTH

## 2022-10-23 LAB — LEGIONELLA PNEUMOPHILA SEROGP 1 UR AG: L. pneumophila Serogp 1 Ur Ag: NEGATIVE

## 2022-10-23 LAB — URIC ACID, RANDOM URINE: Uric Acid, Urine: 9.7 mg/dL

## 2022-10-23 LAB — UREA NITROGEN, URINE: Urea Nitrogen, Ur: 124 mg/dL

## 2022-10-23 MED ORDER — FREE WATER
50.0000 mL | Freq: Four times a day (QID) | Status: DC
  Administered 2022-10-23 – 2022-10-31 (×31): 50 mL

## 2022-10-23 MED ORDER — ASPIRIN 325 MG PO TABS
325.0000 mg | ORAL_TABLET | Freq: Every day | ORAL | Status: DC
  Administered 2022-10-24 – 2022-10-31 (×8): 325 mg
  Filled 2022-10-23 (×8): qty 1

## 2022-10-23 MED ORDER — OSMOLITE 1.2 CAL PO LIQD
1000.0000 mL | ORAL | Status: DC
  Administered 2022-10-23 – 2022-10-29 (×7): 1000 mL
  Filled 2022-10-23: qty 1000

## 2022-10-23 MED ORDER — SIMVASTATIN 20 MG PO TABS
10.0000 mg | ORAL_TABLET | Freq: Every evening | ORAL | Status: DC
  Administered 2022-10-23 – 2022-10-30 (×8): 10 mg via NASOGASTRIC
  Filled 2022-10-23 (×8): qty 1

## 2022-10-23 MED ORDER — ACETAMINOPHEN 325 MG PO TABS
325.0000 mg | ORAL_TABLET | ORAL | Status: DC | PRN
  Administered 2022-10-27 – 2022-10-31 (×2): 650 mg via NASOGASTRIC
  Filled 2022-10-23 (×2): qty 2

## 2022-10-23 NOTE — Progress Notes (Signed)
Initial Nutrition Assessment  DOCUMENTATION CODES:  Not applicable  INTERVENTION:  Initiate tube feeding via cortrak tube: Osmolite 1.2  at 60 ml/h (1440 ml per day) Start at 68m/h and advance by 116mq8h to goal of 60 Free water 5092m6h Provides 1728 kcal, 80 gm protein, 1181 ml free water daily (1381m74m+flush)  NUTRITION DIAGNOSIS:  Inadequate oral intake related to dysphagia as evidenced by NPO status.  GOAL:  Patient will meet greater than or equal to 90% of their needs  MONITOR:  Diet advancement, Labs, Weight trends, Skin, TF tolerance  REASON FOR ASSESSMENT:  Consult Enteral/tube feeding initiation and management  ASSESSMENT:   Pt with hx of HTN, HLD, [name], and hx CVA presented to ED with stroke like symptoms. Imaging showed multiple acute cortically-based infarcts within the right MCA territory and right MCA/PCA watershed territory.  Pt resting in bed at the time of assessment. Awake, but speech unintelligible at this time. Cortrak in place and terminates gastric. Family report pt generally has a good appetite and weight has been stable. Very independent at baseline. Some mild deficits noted, but seems to be more consistent with aging. No concerns for refeeding, but pt has not had PO in about 1.5 days. Answered all questions about TF and oral care from family.  Nutritionally Relevant Medications: Scheduled Meds:  simvastatin  10 mg Per NG tube QPM   Continuous Infusions:  sodium chloride 100 mL/hr at 10/23/22 0100   azithromycin 500 mg (10/23/22 0110)   cefTRIAXone (ROCEPHIN)  IV 1 g (10/23/22 0015)   Labs Reviewed: Na 127, Chloride 96 K 3.3 CBG ranges from 80-108 mg/dL over the last 24 hours  NUTRITION - FOCUSED PHYSICAL EXAM: Flowsheet Row Most Recent Value  Orbital Region No depletion  Upper Arm Region No depletion  Thoracic and Lumbar Region No depletion  Buccal Region No depletion  Temple Region Mild depletion  Clavicle Bone Region Mild depletion   Clavicle and Acromion Bone Region No depletion  Scapular Bone Region No depletion  Dorsal Hand Unable to assess  [mittens]  Patellar Region No depletion  Anterior Thigh Region Mild depletion  Posterior Calf Region Mild depletion  Edema (RD Assessment) None  Hair Reviewed  Eyes Reviewed  Mouth Reviewed  Skin Reviewed  Nails Unable to assess    Diet Order:   Diet Order             Diet NPO time specified  Diet effective now                   EDUCATION NEEDS:  Not appropriate for education at this time  Skin:  Skin Assessment: Reviewed RN Assessment  Last BM:  12/12  Height:  Ht Readings from Last 1 Encounters:  10/22/22 '5\' 9"'$  (1.753 m)    Weight:  Wt Readings from Last 1 Encounters:  10/23/22 83.1 kg    Ideal Body Weight:  72.7 kg  BMI:  Body mass index is 27.05 kg/m.  Estimated Nutritional Needs:  Kcal:  1700-1900 kcal/d Protein:  80-95g/d Fluid:  >/=1.8L/d    RachRanell Patrick, LDN Clinical Dietitian RD pager # available in [facility] hours/weekend pager # available in [facility]

## 2022-10-23 NOTE — Evaluation (Signed)
Occupational Therapy Evaluation Patient Details Name: Ryan Savage MRN: [id number] DOB: 09/23/1929 Today's Date: 10/23/2022   History of Present Illness Ryan Savage is a 88 year old with a past medical history that includes atrial fibrillation not on anticoagulation due to bilateral subdural bleeds and recurrent falls that required evacuation, coronary artery disease, hyperlipidemia, hypertension, sleep apnea, thrombocytopenia, prior strokes with residual right homonymous hemianopsia as well as residual word finding difficulty brought in for emergent evaluation of left-sided weakness and strokelike symptoms. MRI showed multiple acute cortically-based infarcts within the right MCA territory and right MCA/PCA watershed territory   Clinical Impression   PTA, per son, pt was independent in ADL and assisted within the home with IADL. Pt initially alert on arrival, but becoming lethargic after oral care at bed level; mod A to initiate. Pt following simple commands with mod-max cues. Coming to EOB with min A, but upon sitting, pt self-initiating supine and shaking head no when asked to stand. Upon supine, pt falling asleep. Will continue to further assess. Pt presents with weakness, decreased cognition, balance, safety, awareness, and activity tolerance. Recommend ST-SNF for continued OT services upon discharge.    Recommendations for follow up therapy are one component of a multi-disciplinary discharge planning process, led by the attending physician.  Recommendations may be updated based on patient status, additional functional criteria and insurance authorization.   Follow Up Recommendations  Skilled nursing-short term rehab (<3 hours/day)     Assistance Recommended at Discharge Frequent or constant Supervision/Assistance  Patient can return home with the following A lot of help with walking and/or transfers;A lot of help with bathing/dressing/bathroom;Assistance with  cooking/housework;Assistance with feeding;Direct supervision/assist for medications management;Direct supervision/assist for financial management;Assist for transportation;Help with stairs or ramp for entrance    Functional Status Assessment  Patient has had a recent decline in their functional status and demonstrates the ability to make significant improvements in function in a reasonable and predictable amount of time.  Equipment Recommendations  Other (comment) (TBD next venue)    Recommendations for Other Services       Precautions / Restrictions Precautions Precautions: Fall;Other (comment) (seizure and aspiration) Restrictions Weight Bearing Restrictions: No      Mobility Bed Mobility Overal bed mobility: Needs Assistance Bed Mobility: Supine to Sit, Sit to Supine     Supine to sit: Min assist Sit to supine: Min assist   General bed mobility comments: Pt requires min A for bil LE to EOB and then at trunk with verbal/tactile cues for sequencing with supine to sitting and then Min A for bil LE for sitting to supine. Pt requires multi  modal cues for correct sequencing with sitting to supine.    Transfers                   General transfer comment: Pt lethargic and when asked to stand, self-initiating supine      Balance Overall balance assessment: Needs assistance Sitting-balance support: Bilateral upper extremity supported Sitting balance-Leahy Scale: Fair                                     ADL either performed or assessed with clinical judgement   ADL Overall ADL's : Needs assistance/impaired     Grooming: Oral care;Moderate assistance;Bed level Grooming Details (indicate cue type and reason): Mod A due to lethargy. OT applying oral rinse to toothbrush with suction and initially hand over hand to initiate. Upper  Body Bathing: Moderate assistance;Sitting   Lower Body Bathing: Maximal assistance;Sit to/from stand   Upper Body Dressing :  Moderate assistance;Sitting   Lower Body Dressing: Maximal assistance;Sit to/from Health and safety inspector Details (indicate cue type and reason): Pt too lethargic           General ADL Comments: Per PT note, min A for standing with RW, but pt lethargic during this session, and requiring mod-max cues for sequecnig of all tasks. Upon achieving EOB, pt immediately self-initiating supine     Vision Baseline Vision/History: 6 Macular Degeneration (per son, macular degeneration, and per chart, R homynous hemianopsia) Ability to See in Adequate Light: 3 Highly impaired Patient Visual Report:  (Pt unable to report) Vision Assessment?: Vision impaired- to be further tested in functional context Additional Comments: Unsure if pt is near baseline; consistently overshooting to L     Perception     Praxis      Pertinent Vitals/Pain Pain Assessment Pain Assessment: Faces Faces Pain Scale: No hurt Pain Intervention(s): Monitored during session     Hand Dominance Right   Extremity/Trunk Assessment Upper Extremity Assessment Upper Extremity Assessment: Generalized weakness;Difficult to assess due to impaired cognition (Pt lethargic. Using BUE to pull self into sitting position, but after sitting EOB ~2 seconds, self initiating supine and falling asleep after)   Lower Extremity Assessment Lower Extremity Assessment: Defer to PT evaluation   Cervical / Trunk Assessment Cervical / Trunk Assessment: Kyphotic   Communication Communication Communication: Expressive difficulties   Cognition Arousal/Alertness: Awake/alert, Lethargic Behavior During Therapy: Restless Overall Cognitive Status: History of cognitive impairments - at baseline                                 General Comments: Pt was perseverating on removing mitts and reaching for lines. Pt following basic commands but very lethargic. Garbled/,u,bling speech throughout     General Comments  VSS. Son  present    Exercises     Shoulder Instructions      Home Living Family/patient expects to be discharged to:: Private residence Living Arrangements: Spouse/significant other Available Help at Discharge: Family;Available 24 hours/day Type of Home: House Home Access: Stairs to enter [employer] of Steps: 7 Entrance Stairs-Rails: Right;Left Home Layout: One level     Bathroom Shower/Tub: Walk-in Hydrologist: Standard Bathroom Accessibility: Yes How Accessible: Accessible via walker Home Equipment: Grab bars - tub/shower   Additional Comments: Pt walks about 20 min with spouse      Prior Functioning/Environment Prior Level of Function : Needs assist  Cognitive Assist : ADLs (cognitive)   ADLs (Cognitive): Intermittent cues       Mobility Comments: per son, walking stick ADLs Comments: Per son, indepentdent in ADL and set-up for IADL.        OT Problem List: Decreased strength;Decreased activity tolerance;Impaired balance (sitting and/or standing);Impaired vision/perception;Decreased coordination;Decreased cognition;Decreased safety awareness;Decreased knowledge of use of DME or AE;Impaired UE functional use      OT Treatment/Interventions: Self-care/ADL training;Therapeutic exercise;Balance training;Patient/family education;Therapeutic activities;Cognitive remediation/compensation;Visual/perceptual remediation/compensation;DME and/or AE instruction    OT Goals(Current goals can be found in the care plan section) Acute Rehab OT Goals Patient Stated Goal: none stated OT Goal Formulation: With patient Time For Goal Achievement: 11/06/22 Potential to Achieve Goals: Good  OT Frequency: Min 2X/week    Co-evaluation  AM-PAC OT "6 Clicks" Daily Activity     Outcome Measure Help from another person eating meals?: A Lot Help from another person taking care of personal grooming?: A Lot Help from another person toileting,  which includes using toliet, bedpan, or urinal?: A Lot Help from another person bathing (including washing, rinsing, drying)?: A Lot Help from another person to put on and taking off regular upper body clothing?: A Lot Help from another person to put on and taking off regular lower body clothing?: A Lot 6 Click Score: 12   End of Session Nurse Communication: Mobility status (lethargic)  Activity Tolerance: Patient tolerated treatment well;Patient limited by lethargy Patient left: in bed;with bed alarm set;with call bell/phone within reach;with family/visitor present  OT Visit Diagnosis: Unsteadiness on feet (R26.81);Muscle weakness (generalized) (M62.81);Other symptoms and signs involving cognitive function;Low vision, both eyes (H54.2);Cognitive communication deficit (R41.841);Feeding difficulties (R63.3) Symptoms and signs involving cognitive functions: Cerebral infarction                Time: 7782-4235 OT Time Calculation (min): 24 min Charges:  OT General Charges $OT Visit: 1 Visit OT Evaluation $OT Eval Moderate Complexity: 1 Mod OT Treatments $Self Care/Home Management : 8-22 mins  Elder Cyphers, OTR/L [facility] Acute Rehabilitation Office: [phone]  Magnus Ivan 10/23/2022, 3:58 PM

## 2022-10-23 NOTE — Evaluation (Signed)
Clinical/Bedside Swallow Evaluation Patient Details  Name: Ryan Savage MRN: [id number] Date of Birth: 1929-05-15  Today's Date: 10/23/2022 Time: SLP Start Time (ACUTE ONLY): 17 SLP Stop Time (ACUTE ONLY): 1046 SLP Time Calculation (min) (ACUTE ONLY): 41 min  Past Medical History:  Past Medical History:  Diagnosis Date   Aortic stenosis 05/22/2018   Mild with mean AVG 49mHg by echo 05/2018   Atrial fibrillation, chronic (HCC)    Not on anticoagulation due to history of bilateral subdural bleeds due to recurrent falls   BPH (benign prostatic hypertrophy)    [name] (coronary artery disease), native coronary artery    Severe two-vessel [name] with chronically occluded LAD and RCA with widely patent left main, intermediate, and left circumflex branches.  On medical management.  He has extensive collaterals.   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 fib with RVR.  Repeat echo 05/2018 with EF 45-50%.   [name] (degenerative joint disease)    GERD (gastroesophageal reflux disease)    Hyperlipidemia    LDL goal less then 100   Hypertension    Microscopic hematuria    Dr DDiona Fanti  Mitral valve prolapse    with mild MR   OSA (obstructive sleep apnea)    Pulmonary HTN (HCC)    PASP 51mg by echo 05/2018 but normal pressures on right heart cath.   SDH (subdural hematoma) (HCC) 01/2018   SIADH (syndrome of inappropriate ADH production) [place]   Skin cancer    "side of my nose" (11/19/2017)   [place]   "fully recovered"   Thrombocytopenia ([facility]   Mild- platelet count 143,000 on 02/2011, stable 08/2011   Past Surgical History:  Past Surgical History:  Procedure Laterality Date   APPENDECTOMY  1950   BACK SURGERY     BURR HOLE Bilateral 01/25/2018   Procedure: BUHaskell Flirt Surgeon: CrKary KosMD;  [facility];   Laterality: Bilateral;   BURR HOLE Left 02/20/2018   Procedure: BURR HOLES for subdural hematoma;  Surgeon: JeNewman PiesMD;  [place] [facility] [place]: [place]surgery;  Laterality: Left;   BURR HOLE Left 03/20/2018   Procedure: Craniotomy for subdural hematoma;  Surgeon: ElKristeen MissMD;  [place] [facility]surgery;  Laterality: Left;   FOREARM FRACTURE SURGERY Right [place] ruptured disc repair   RIGHT/LEFT HEART CATH AND CORONARY ANGIOGRAPHY N/A 11/21/2017   Procedure: RIGHT/LEFT HEART CATH AND CORONARY ANGIOGRAPHY;  Surgeon: CoSherren MochaMD;  [facility];  Service: Cardiovascular;  Laterality: N/A;   SKIN CANCER EXCISION Left    "side of my nose"   H[facility]  89[facility].  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  He did not pass 3 ounce Yale and swallow eval ordered. Per neuro note, likely source of CVAs is Afib.  CXR showed "Cardiomegaly. Bilateral interstitial and airspace opacities, left  greater than right could reflect asymmetric edema or infection."    Assessment / Plan / Recommendation  Clinical Impression  Patient currently presens with AMS - waxing/waning from agitation to lethargy- and occasional ability to follow some basic directions.    Facial, hypoglossal nerves  impacted at least but also ? potential glossopharyngeal and vagal nerve deficit.    Pt allowed SLP to provide oral car, SLP removing a large viscous blood tinged bolus from roof of oral cavity over approx 15 minutes.  He continued with retained amount which SlP unable to fully clear at anterior hard palate.  Provided pt with tsps of thin and 2 single ice chips.  Prolonged oral holding without manipulation observed with 2/3 boluses.  With consistent stimulation, pt able to masticate and swallow single bolus.  At this time,  recommend pt remain NPO except single ice chips given by RN only after mouth care when pt fully alert, agreeable.  Advise ongoing SLP to determine readiness for po and/or instrumental evaluation.  Also recommend follow up with pallaitive team given pt's advanced age and multiple CVAs.  Educated family to recommendations using teach back and swallow precaution signs. Son reports pt with frequent throat clearing at night over the last few days. SLP Visit Diagnosis: Dysphagia, unspecified (R13.10);Dysphagia, oropharyngeal phase (R13.12)    Aspiration Risk  Severe aspiration risk    Diet Recommendation NPO;Ice chips PRN after oral care   Medication Administration: Via alternative means    Other  Recommendations Oral Care Recommendations: Oral care QID Other Recommendations: Have oral suction available    Recommendations for follow up therapy are one component of a multi-disciplinary discharge planning process, led by the attending physician.  Recommendations may be updated based on patient status, additional functional criteria and insurance authorization.  Follow up Recommendations Skilled nursing-short term rehab (<3 hours/day)      Assistance Recommended at Discharge    Functional Status Assessment Patient has had a recent decline in their functional status and/or demonstrates limited ability to make significant improvements in function in a reasonable and predictable amount of time  Frequency and Duration min 1 x/week  1 week       Prognosis Prognosis for Safe Diet Advancement: Guarded Barriers to Reach Goals: Time post onset      Swallow Study   General Date of Onset: 10/23/22 HPI: 88 yo male [facility] with AMS, left side weakness.  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  He did not pass 3 ounce Yale and swallow eval ordered. Per neuro note, likely source of CVAs is Afib.  CXR  showed "Cardiomegaly. Bilateral interstitial and airspace opacities, left  greater than right could reflect asymmetric edema or infection." Type of Study: Bedside Swallow Evaluation Diet Prior to this Study: NPO Temperature Spikes Noted: No Respiratory Status: Nasal cannula History of Recent Intubation: No Behavior/Cognition: Alert;Distractible;Doesn't follow directions (inconsistently follows directions) Oral Cavity Assessment: Dry;Dried secretions (hard palate- anterior and posterior) Oral Care Completed by SLP: Yes (extensive) Oral Cavity - Dentition: Adequate natural dentition Self-Feeding Abilities:  (has macular degeneration) Baseline Vocal Quality: Normal Volitional Cough: Cognitively unable to elicit Volitional Swallow:  (elicited x1 with great effort)    Oral/Motor/Sensory Function Overall Oral Motor/Sensory Function: Moderate impairment (from tasks pt able to complete, he demonstrated left facial asymmetry and lingual deviation - hypoglossal, and facial nerve impact at least) Facial ROM: Reduced left;Suspected CN VII (facial) dysfunction Facial Symmetry: Abnormal symmetry left;Suspected CN VII (facial) dysfunction Facial Strength: Suspected CN VII (facial) dysfunction;Reduced left Lingual ROM: Reduced left;Suspected CN XII (hypoglossal) dysfunction Lingual Symmetry: Within Functional Limits Lingual Strength: Suspected CN XII (hypoglossal) dysfunction;Reduced Velum: Other (comment) (could not view adequately as pt did not  open mouth fully)   Ice Chips Ice chips: Impaired Presentation: Spoon Oral Phase Impairments: Reduced labial seal;Poor awareness of bolus;Reduced lingual movement/coordination Oral Phase Functional Implications: Prolonged oral transit;Left anterior spillage;Oral holding Pharyngeal Phase Impairments: Suspected delayed Swallow Other Comments: left anterior holding without pt attempt to masticate/swallow with 2nd ice chip bolus, orally suctioned 2nd bolus   Thin  Liquid Thin Liquid: Impaired Presentation: Spoon Oral Phase Impairments: Reduced labial seal;Poor awareness of bolus Oral Phase Functional Implications: Oral holding Other Comments: orally suctioned 2nd bolus    Nectar Thick Nectar Thick Liquid: Not tested   Honey Thick Honey Thick Liquid: Not tested   Puree Puree: Not tested   Solid     Solid: Not tested      Macario Golds 10/23/2022,11:11 AM Kathleen Lime, MS [place] Office [phone] Pager [phone]

## 2022-10-23 NOTE — Progress Notes (Signed)
PROGRESS NOTE    Ryan Savage  NTI:[id number] DOB: Mar 16, 1929 DOA: 10/21/2022 PCP: Lajean Manes, MD   Brief Narrative:  Ryan Savage is a 88 y.o. male with medical history significant of atrial fibrillation not on [place] due to prior hx of bilateral subdural bleeds as well as recurrent falls, of note patient offered watchman previously but refused. Patient also has history significant for coronary artery disease, hyperlipidemia, hypertension, obstructive sleep apnea, thrombocytopenia, CVA with residual visual deficits as well  word finding difficulties who per wife first noted around 10pm evening of presentation was found to be altered/confused - unable to communicate which is not his baseline.  Assessment & Plan:   Principal Problem:   Cerebral embolism with cerebral infarction Active Problems:   Atrial fibrillation, chronic (HCC)   Elevated troponin   Acute metabolic encephalopathy  Acute metabolic encephalopathy Secondary to multiple acute CVAs  -MRI confirms multiple CVA of the R MCA/PCA - largest is 4.7cm -Neuro following -likely embolic in origin given A-fib unable to tolerate anticoagulation due to previous subdural hematoma -Continue aspirin for now per neurology  Acute profound dysphagia -Secondary to above, speech unable to advance diet, remains n.p.o. -Core track to be placed 10/23/2022   History of subdural hematoma status postevacuation bilaterally -Patient has not been on anticoagulation given his history   Questionable CAP -Continue ceftriaxone azithromycin for coverage, 5-day course, no indication for steroids -Patient's transiently hypoxic at intake now resolved back on room air   Atrial fib, not on anticoagulation, rate controlled -not on [place] -Remains in A-fib -Continue rate control with beta-blocker -Previously declined Watchman device, as above lengthy discussion with family will need to be had in regards to anticoagulation given patient remains high  risk whether he is on anticoagulation or not given his history and above   Hypertension  -Permissive hypertension, resolving, resume medications as indicated -Blood pressure currently   Severe 2 vessel [name] -Continue beta-blocker, statin, on aspirin given above   CHF, reduced ejection fraction -Remains euvolemic, hold diuretics in the interim given poor p.o. intake -likely reinstitute in the next few days as patient's intake improves with core track tube feeds   Pulmonary hypertension, sleep apnea -Not currently on CPAP, will continue to follow clinically   Hyperlipidemia -continue statin    Aortic root ascending Aortic dilation -continue routine surveillance    DVT prophylaxis: SCDs only Code Status: full  Family Communication: wife at bedside  Status is: Inpatient  Dispo: The patient is from: Home              Anticipated d/c is to: SNF              Anticipated d/c date is: 72+ hours              Patient currently not medically stable for discharge  Consultants:  Neuro  Procedures:  None  Antimicrobials:  Azithromycin, ceftriaxone x 5 days  Subjective: No acute issues or events overnight, mental status continues to wax and wane -review of systems limited given patient's dysphasia  Objective: Vitals:   10/22/22 2018 10/22/22 2330 10/23/22 0357 10/23/22 0421  BP: (!) 112/54 (!) 109/57 (!) 120/53   Pulse: 66 (!) 57 63   Resp: '17 20 18   '$ Temp: 98 F (36.7 C) 98.8 F (37.1 C) 98.4 F (36.9 C)   TempSrc: Oral Oral Oral   SpO2: 93% 99% 93%   Weight:    83.1 kg  Height:       No intake  or output data in the 24 hours ending 10/23/22 0727 Filed Weights   10/22/22 0031 10/22/22 1251 10/23/22 0421  Weight: 83.8 kg 78.6 kg 83.1 kg    Examination:  General:  Pleasantly resting in bed, No acute distress.  Difficult to address orientation given patient's speech HEENT:  Normocephalic atraumatic.  Sclerae nonicteric, noninjected.  Extraocular movements intact  bilaterally. Neck:  Without mass or deformity.  Trachea is midline. Lungs:  Clear to auscultate bilaterally without rhonchi, wheeze, or rales. Heart:  Regular rate and rhythm.  Without murmurs, rubs, or gallops. Abdomen:  Soft, nontender, nondistended.  Without guarding or rebound. Extremities: Without cyanosis, clubbing, edema, or obvious deformity.  Data Reviewed: I have personally reviewed following labs and imaging studies  CBC: Recent Labs  Lab 10/21/22 2353 10/22/22 0002 10/22/22 0856 10/23/22 0402  WBC 6.7  --   --  6.8  NEUTROABS 3.6  --   --   --   HGB 12.5* 13.3 12.2* 11.4*  HCT 36.6* 39.0 36.0* 32.6*  MCV 92.9  --   --  89.8  PLT 159  --   --  517*   Basic Metabolic Panel: Recent Labs  Lab 10/21/22 2353 10/22/22 0002 10/22/22 0856 10/23/22 0402  NA 124* 126* 127* 127*  K 4.0 4.2 3.7 3.3*  CL 89* 90*  --  96*  CO2 26  --   --  22  GLUCOSE 107* 100*  --  88  BUN 21 25*  --  13  CREATININE 1.19 1.20  --  0.91  CALCIUM 8.9  --   --  8.2*   GFR: Estimated Creatinine Clearance: 52.9 mL/min (by C-G formula based on SCr of 0.91 mg/dL). Liver Function Tests: Recent Labs  Lab 10/21/22 2353 10/23/22 0402  AST 27 41  ALT 15 15  ALKPHOS 55 46  BILITOT 0.9 1.2  PROT 7.2 5.8*  ALBUMIN 3.5 2.9*   No results for input(s): "LIPASE", "AMYLASE" in the last 168 hours. Recent Labs  Lab 10/22/22 0850  AMMONIA 28   Coagulation Profile: Recent Labs  Lab 10/21/22 2353  INR 1.2   Cardiac Enzymes: Recent Labs  Lab 10/22/22 0218  CKTOTAL 105   BNP (last 3 results) No results for input(s): "PROBNP" in the last 8760 hours. HbA1C: Recent Labs    10/22/22 0850  HGBA1C 5.9*   CBG: Recent Labs  Lab 10/21/22 2354 10/23/22 0640  GLUCAP 108* 98   Lipid Profile: Recent Labs    10/23/22 0402  CHOL 97  HDL 39*  LDLCALC 50  TRIG 39  CHOLHDL 2.5   Thyroid Function Tests: Recent Labs    10/22/22 0850  TSH 1.301   Anemia Panel: No results for  input(s): "VITAMINB12", "FOLATE", "FERRITIN", "TIBC", "IRON", "RETICCTPCT" in the last 72 hours. Sepsis Labs: Recent Labs  Lab 10/22/22 0305 10/22/22 6160  LATICACIDVEN 1.8 1.7    Recent Results (from the past 240 hour(s))  Resp panel by RT-PCR (RSV, Flu A&B, Covid) Anterior Nasal Swab     Status: None   Collection Time: 10/22/22  2:19 AM   Specimen: Anterior Nasal Swab  Result Value Ref Range Status   SARS Coronavirus 2 by RT PCR NEGATIVE NEGATIVE Final    Comment: (NOTE) SARS-CoV-2 target nucleic acids are NOT DETECTED.  The SARS-CoV-2 RNA is generally detectable in upper respiratory specimens during the acute phase of infection. The lowest concentration of SARS-CoV-2 viral copies this assay can detect is 138 copies/mL. A negative result does not  preclude SARS-Cov-2 infection and should not be used as the sole basis for treatment or other patient management decisions. A negative result may occur with  improper specimen collection/handling, submission of specimen other than nasopharyngeal swab, presence of viral mutation(s) within the areas targeted by this assay, and inadequate number of viral copies(<138 copies/mL). A negative result must be combined with clinical observations, patient history, and epidemiological information. The expected result is Negative.  Fact Sheet for Patients:  EntrepreneurPulse.com.au  Fact Sheet for Healthcare Providers:  IncredibleEmployment.be  This test is no t yet approved or cleared by the [place] FDA and  has been authorized for detection and/or diagnosis of SARS-CoV-2 by FDA under an Emergency Use Authorization (EUA). This EUA will remain  in effect (meaning this test can be used) for the duration of the COVID-19 declaration under Section 564(b)(1) of the Act, 21 U.S.C.section 360bbb-3(b)(1), unless the authorization is terminated  or revoked sooner.       Influenza A by PCR NEGATIVE NEGATIVE  Final   Influenza B by PCR NEGATIVE NEGATIVE Final    Comment: (NOTE) The Xpert Xpress SARS-CoV-2/FLU/RSV plus assay is intended as an aid in the diagnosis of influenza from Nasopharyngeal swab specimens and should not be used as a sole basis for treatment. Nasal washings and aspirates are unacceptable for Xpert Xpress SARS-CoV-2/FLU/RSV testing.  Fact Sheet for Patients: EntrepreneurPulse.com.au  Fact Sheet for Healthcare Providers: IncredibleEmployment.be  This test is not yet approved or cleared by the [place] FDA and has been authorized for detection and/or diagnosis of SARS-CoV-2 by FDA under an Emergency Use Authorization (EUA). This EUA will remain in effect (meaning this test can be used) for the duration of the COVID-19 declaration under Section 564(b)(1) of the Act, 21 U.S.C. section 360bbb-3(b)(1), unless the authorization is terminated or revoked.     Resp Syncytial Virus by PCR NEGATIVE NEGATIVE Final    Comment: (NOTE) Fact Sheet for Patients: EntrepreneurPulse.com.au  Fact Sheet for Healthcare Providers: IncredibleEmployment.be  This test is not yet approved or cleared by the [place] FDA and has been authorized for detection and/or diagnosis of SARS-CoV-2 by FDA under an Emergency Use Authorization (EUA). This EUA will remain in effect (meaning this test can be used) for the duration of the COVID-19 declaration under Section 564(b)(1) of the Act, 21 U.S.C. section 360bbb-3(b)(1), unless the authorization is terminated or revoked.  Performed at [facility], [place] [address], [place], [place] [address]   Blood Culture (routine x 2)     Status: None (Preliminary result)   Collection Time: 10/22/22  3:00 AM   Specimen: BLOOD  Result Value Ref Range Status   Specimen Description BLOOD SITE NOT SPECIFIED  Final   Special Requests   Final    BOTTLES DRAWN AEROBIC AND  ANAEROBIC Blood Culture results may not be optimal due to an inadequate volume of blood received in culture bottles   Culture   Final    NO GROWTH < 12 HOURS Performed at [facility], [place] [address] [place], [place] [address]    Report Status PENDING  Incomplete  Blood Culture (routine x 2)     Status: None (Preliminary result)   Collection Time: 10/22/22  6:33 AM   Specimen: BLOOD  Result Value Ref Range Status   Specimen Description BLOOD SITE NOT SPECIFIED  Final   Special Requests   Final    BOTTLES DRAWN AEROBIC AND ANAEROBIC Blood Culture adequate volume   Culture  Final    NO GROWTH < 12 HOURS Performed at [facility] [address], [place], [place] [address]    Report Status PENDING  Incomplete         Radiology Studies: ECHOCARDIOGRAM COMPLETE  Result Date: 10/22/2022    ECHOCARDIOGRAM REPORT   Patient Name:   DREXLER MALAND Date of Exam: 10/22/2022 Medical Rec #:  [id number]       Height:       69.0 in Accession #:    [id number]      Weight:       173.3 lb Date of Birth:  [birth year]        BSA:          1.944 m Patient Age:    13 years        BP:           125/82 mmHg Patient Gender: M               HR:           62 bpm. Exam Location:  Inpatient Procedure: 2D Echo, Cardiac Doppler and Color Doppler Indications:    Stroke I63.9  History:        Patient has prior history of Echocardiogram examinations, most                 recent 07/24/2022. CHF, [name], Stroke, Mitral Valve Prolapse,                 Arrythmias:Atrial Fibrillation, Signs/Symptoms:Syncope; Risk                 Factors:Hypertension and Sleep Apnea. Pulmonary HTN.  Sonographer:    Ronny Flurry Referring Phys: [id number] [place] XU IMPRESSIONS  1. Left ventricular ejection fraction, by estimation, is 55 to 60%. The left ventricle has normal function. The left ventricle has no regional wall motion abnormalities. There is moderate left ventricular hypertrophy. Left ventricular diastolic parameters are  indeterminate.  2. Right ventricular systolic function is normal. The right ventricular size is normal. There is moderately elevated pulmonary artery systolic pressure. The estimated right ventricular systolic pressure is 18.8 mmHg.  3. Left atrial size was severely dilated.  4. Right atrial size was severely dilated.  5. The mitral valve is degenerative. Mild mitral valve regurgitation.  6. Tricuspid valve regurgitation is moderate.  7. 2D AVA 1.71 cm2, DVI 0.32 with normal LV stroke volume. At least mild PLFLGAS. The aortic valve is tricuspid. There is mild calcification of the aortic valve. There is mild thickening of the aortic valve. Aortic valve regurgitation is not visualized.  Mild aortic valve stenosis. Aortic valve mean gradient measures 13.0 mmHg.  8. The inferior vena cava is dilated in size with <50% respiratory variability, suggesting right atrial pressure of 15 mmHg. Comparison(s): Improved assessment of LV SV index, Similar AS gradients. RVSP is higher than prior. FINDINGS  Left Ventricle: Left ventricular ejection fraction, by estimation, is 55 to 60%. The left ventricle has normal function. The left ventricle has no regional wall motion abnormalities. The left ventricular internal cavity size was normal in size. There is  moderate left ventricular hypertrophy. Left ventricular diastolic parameters are indeterminate. Right Ventricle: The right ventricular size is normal. No increase in right ventricular wall thickness. Right ventricular systolic function is normal. There is moderately elevated pulmonary artery systolic pressure. The tricuspid regurgitant velocity is 2.94 m/s, and with an assumed right atrial pressure of 15 mmHg, the estimated right ventricular systolic pressure is  49.6 mmHg. Left Atrium: Left atrial size was severely dilated. Right Atrium: Right atrial size was severely dilated. Pericardium: There is no evidence of pericardial effusion. Mitral Valve: The mitral valve is degenerative  in appearance. Mild mitral valve regurgitation. Tricuspid Valve: The tricuspid valve is normal in structure. Tricuspid valve regurgitation is moderate . No evidence of tricuspid stenosis. Aortic Valve: 2D AVA 1.71 cm2, DVI 0.32 with normal LV stroke volume. At least mild PLFLGAS. The aortic valve is tricuspid. There is mild calcification of the aortic valve. There is mild thickening of the aortic valve. Aortic valve regurgitation is not visualized. Mild aortic stenosis is present. Aortic valve mean gradient measures 13.0 mmHg. Aortic valve peak gradient measures 23.1 mmHg. Aortic valve area, by VTI measures 1.32 cm. Pulmonic Valve: The pulmonic valve was normal in structure. Pulmonic valve regurgitation is mild. No evidence of pulmonic stenosis. Aorta: The aortic root and ascending aorta are structurally normal, with no evidence of dilitation. Venous: The inferior vena cava is dilated in size with less than 50% respiratory variability, suggesting right atrial pressure of 15 mmHg. IAS/Shunts: No atrial level shunt detected by color flow Doppler.  LEFT VENTRICLE PLAX 2D LVIDd:         5.00 cm   Diastology LVIDs:         3.60 cm   LV e' medial:  11.20 cm/s LV PW:         1.40 cm   LV e' lateral: 12.30 cm/s LV IVS:        1.30 cm LVOT diam:     2.30 cm LV SV:         72 LV SV Index:   37 LVOT Area:     4.15 cm  RIGHT VENTRICLE             IVC RV Basal diam:  5.00 cm     IVC diam: 2.70 cm RV S prime:     10.30 cm/s TAPSE (M-mode): 2.0 cm LEFT ATRIUM              Index        RIGHT ATRIUM           Index LA diam:        6.40 cm  3.29 cm/m   RA Area:     39.50 cm LA Vol (A2C):   106.0 ml 54.53 ml/m  RA Volume:   146.00 ml 75.11 ml/m LA Vol (A4C):   96.3 ml  49.54 ml/m LA Biplane Vol: 103.0 ml 52.99 ml/m  AORTIC VALVE AV Area (Vmax):    1.46 cm AV Area (Vmean):   1.33 cm AV Area (VTI):     1.32 cm AV Vmax:           240.50 cm/s AV Vmean:          171.000 cm/s AV VTI:            0.546 m AV Peak Grad:      23.1  mmHg AV Mean Grad:      13.0 mmHg LVOT Vmax:         84.40 cm/s LVOT Vmean:        54.900 cm/s LVOT VTI:          0.173 m LVOT/AV VTI ratio: 0.32  AORTA Ao Root diam: 3.20 cm Ao Asc diam:  3.40 cm TRICUSPID VALVE TR Peak grad:   34.6 mmHg TR Vmax:        294.00 cm/s  SHUNTS Systemic VTI:  0.17 m Systemic Diam: 2.30 cm Rudean Haskell MD Electronically signed by Rudean Haskell MD Signature Date/Time: 10/22/2022/4:32:16 PM    Final    EEG adult  Result Date: 10/22/2022 Derek Jack, MD     10/22/2022  1:31 PM Routine EEG Report Ariana Earwood is a 88 y.o. male with a history of altered mental status who is undergoing an EEG to evaluate for seizures. Report: This EEG was acquired with electrodes placed according to the International 10-20 electrode system (including Fp1, Fp2, F3, F4, C3, C4, P3, P4, O1, O2, T3, T4, T5, T6, A1, A2, Fz, Cz, Pz). The following electrodes were missing or displaced: none. The occipital dominant rhythm was 7 Hz. This activity is reactive to stimulation. Drowsiness was manifested by background fragmentation; deeper stages of sleep were not identified. There was focal slowing over the right hemisphere. There were no interictal epileptiform discharges. There were no electrographic seizures identified. Photic stimulation and hyperventilation were not performed. Impression and clinical correlation: This EEG was obtained while awake and drowsy and is abnormal due to: -  mild diffuse slowing indicative of global cerebral dysfunction - focal slowing over the right hemisphere indicating focal cerebral dysfunction in the region of his known R MCA stroke Epileptiform abnormalities were not seen during this recording. Su Monks, MD [facility] [phone] If 7pm- 7am, please page neurology on call as listed in [place].   MR BRAIN WO CONTRAST  Result Date: 10/22/2022 CLINICAL DATA:  Provided history: Stroke suspected. EXAM: MRI HEAD WITHOUT CONTRAST TECHNIQUE:  Multiplanar, multiecho pulse sequences of the brain and surrounding structures were obtained without intravenous contrast. COMPARISON:  Non-contrast head CT and CT angiogram head/neck 10/22/2022. Brain MRI 07/17/2021. FINDINGS: Brain: Moderate generalized cerebral atrophy. Multiple acute cortically-based infarcts within the mid-to-posterior right frontal lobe and right frontal operculum, right parietal lobe, right temporal lobe and lateral right occipital lobe (right MCA territory and right MCA/PCA watershed territory). The largest infarct within the posterior right frontal lobe measures 4.7 cm and involves the precentral gyrus. Unchanged chronic cortical/subcortical infarcts within the left insula, left frontal lobe, left parietal lobe, left occipital lobe and posteromedial left temporal lobe. Chronic blood products associated with the chronic left PCA territory infarct. Chronic lacunar infarct within the right basal ganglia/internal capsule. Background mild multifocal T2 FLAIR hyperintense signal abnormality within the cerebral white matter, nonspecific but compatible with chronic small vessel ischemic disease. Unchanged small chronic infarcts within the bilateral cerebellar hemispheres. Unchanged chronic hemorrhage within the right cerebellar hemisphere. No evidence of an intracranial mass. No extra-axial fluid collection. No midline shift. Vascular: Maintained flow voids within the proximal large arterial vessels. Skull and upper cervical spine: Right frontal and parietal burr holes. No focal suspicious marrow lesion. Sinuses/Orbits: No mass or acute finding within the imaged orbits. Minimal mucosal thickening within the bilateral ethmoid air cells. Other: Trace fluid within the bilateral mastoid air cells. IMPRESSION: Multiple acute cortically-based infarcts within the right MCA territory and right MCA/PCA watershed territory. The largest acute infarct is located within the posterior right frontal lobe,  measuring 4.7 cm and involving the right precentral gyrus. Background parenchymal atrophy, chronic small vessel ischemic disease and chronic infarcts as detailed. Chronic hemorrhage within the right cerebellar hemisphere, unchanged. Chronic blood products also present at site of the chronic left PCA territory infarct. Electronically Signed   By: Kellie Simmering D.O.   On: 10/22/2022 10:29   DG Chest Portable 1 View  Result Date: 10/22/2022 CLINICAL DATA:  Altered  mental status 03/26/2018 EXAM: PORTABLE CHEST 1 VIEW COMPARISON:  None Available. FINDINGS: Cardiomegaly, vascular congestion. Interstitial and airspace opacities bilaterally, left greater than right. No visible effusions or acute bony abnormality. IMPRESSION: Cardiomegaly. Bilateral interstitial and airspace opacities, left greater than right could reflect asymmetric edema or infection. Electronically Signed   By: Rolm Baptise M.D.   On: 10/22/2022 00:51   CT ANGIO HEAD NECK W WO CM (CODE STROKE)  Result Date: 10/22/2022 CLINICAL DATA:  Code stroke EXAM: CT ANGIOGRAPHY HEAD AND NECK TECHNIQUE: Multidetector CT imaging of the head and neck was performed using the standard protocol during bolus administration of intravenous contrast. Multiplanar CT image reconstructions and MIPs were obtained to evaluate the vascular anatomy. Carotid stenosis measurements (when applicable) are obtained utilizing NASCET criteria, using the distal internal carotid diameter as the denominator. RADIATION DOSE REDUCTION: This exam was performed according to the departmental dose-optimization program which includes automated exposure control, adjustment of the mA and/or kV according to patient size and/or use of iterative reconstruction technique. CONTRAST:  72m OMNIPAQUE IOHEXOL 350 MG/ML SOLN COMPARISON:  None Available. FINDINGS: CTA NECK FINDINGS SKELETON: There is no bony spinal canal stenosis. No lytic or blastic lesion. OTHER NECK: Normal pharynx, larynx and major  salivary glands. No cervical lymphadenopathy. Unremarkable thyroid gland. UPPER CHEST: No pneumothorax or pleural effusion. No nodules or masses. AORTIC ARCH: There is calcific atherosclerosis of the aortic arch. There is no aneurysm, dissection or hemodynamically significant stenosis of the visualized portion of the aorta. Conventional 3 vessel aortic branching pattern. The visualized proximal subclavian arteries are widely patent. RIGHT CAROTID SYSTEM: Normal without aneurysm, dissection or stenosis. LEFT CAROTID SYSTEM: Normal without aneurysm, dissection or stenosis. VERTEBRAL ARTERIES: Left dominant configuration. Both origins are clearly patent. There is no dissection, occlusion or flow-limiting stenosis to the skull base (V1-V3 segments). CTA HEAD FINDINGS POSTERIOR CIRCULATION: --Vertebral arteries: Normal V4 segments. --Inferior cerebellar arteries: Normal. --Basilar artery: Normal. --Superior cerebellar arteries: Normal. --Posterior cerebral arteries (PCA): Normal. ANTERIOR CIRCULATION: --Intracranial internal carotid arteries: Normal. --Anterior cerebral arteries (ACA): Normal. Both A1 segments are present. Patent anterior communicating artery (a-comm). --Middle cerebral arteries (MCA): Normal. VENOUS SINUSES: As permitted by contrast timing, patent. ANATOMIC VARIANTS: None Review of the MIP images confirms the above findings. IMPRESSION: No emergent large vessel occlusion or high-grade stenosis of the intracranial arteries. Aortic atherosclerosis (ICD10-I70.0). Electronically Signed   By: KUlyses JarredM.D.   On: 10/22/2022 00:24   CT HEAD CODE STROKE WO CONTRAST  Result Date: 10/22/2022 CLINICAL DATA:  Code stroke.  Left-sided weakness and facial droop EXAM: CT HEAD WITHOUT CONTRAST TECHNIQUE: Contiguous axial images were obtained from the base of the skull through the vertex without intravenous contrast. RADIATION DOSE REDUCTION: This exam was performed according to the departmental  dose-optimization program which includes automated exposure control, adjustment of the mA and/or kV according to patient size and/or use of iterative reconstruction technique. COMPARISON:  None Available. FINDINGS: Brain: There is no mass, hemorrhage or extra-axial collection. Generalized atrophy. 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 Vascular: No abnormal hyperdensity of the major intracranial arteries or dural venous sinuses. No intracranial atherosclerosis. Skull: The visualized skull base, calvarium and extracranial soft tissues are normal. Sinuses/Orbits: No fluid levels or advanced mucosal thickening of the visualized paranasal sinuses. No mastoid or middle ear effusion. The orbits are normal. ASPECTS ([facility]) - Ganglionic level infarction (caudate, lentiform nuclei, internal capsule, insula, M1-M3 cortex): 7 -  Supraganglionic infarction (M4-M6 cortex): 3 Total score (0-10 with 10 being normal): 10 IMPRESSION: 1. No acute hemorrhage. 2. 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 3. ASPECTS is 10. These results were communicated to Dr. Amie Portland at 12:13 am on 10/22/2022 by text page via the [employer] messaging system. Electronically Signed   By: Ulyses Jarred M.D.   On: 10/22/2022 00:14    Scheduled Meds:   stroke: early stages of recovery book   Does not apply Once   aspirin  300 mg Rectal Daily   heparin injection (subcutaneous)  5,000 Units Subcutaneous Q8H   simvastatin  10 mg Oral QPM   Continuous Infusions:  sodium chloride 100 mL/hr at 10/23/22 0100   azithromycin 500 mg (10/23/22 0110)   cefTRIAXone (ROCEPHIN)  IV 1 g (10/23/22 0015)    LOS: 1 day   Time spent: 58mn  Jordin Dambrosio C Caidin Heidenreich, DO [facility]  If 7PM-7AM, please contact night-coverage [email or web address]  10/23/2022, 7:27 AM

## 2022-10-23 NOTE — Procedures (Signed)
Cortrak  Person Inserting Tube:  Jamariyah Johannsen L, RD Tube Type:  Cortrak - 43 inches Tube Size:  10 Tube Location:  Left nare Secured by: Bridle Technique Used to Measure Tube Placement:  Marking at nare/corner of mouth Cortrak Secured At:  65 cm   Cortrak Tube Team Note:  Consult received to place a Cortrak feeding tube.   X-ray is required, abdominal x-ray has been ordered by the Cortrak team. Please confirm tube placement before using the Cortrak tube.   If the tube becomes dislodged please keep the tube and contact the Cortrak team at [email or web address] for replacement.  If after hours and replacement cannot be delayed, place a NG tube and confirm placement with an abdominal x-ray.    Krishon Adkison RD, LDN Clinical Dietitian See AMiON for contact information.    

## 2022-10-23 NOTE — Progress Notes (Addendum)
STROKE TEAM PROGRESS NOTE   INTERVAL HISTORY Patient evaluated at bedside. Patient was alert but confused - this is an improvement from yesterday. He was cooperative with the neuro exam. Continued deficits noted on L side. See neuro exam for more details.  Vitals:   10/22/22 2330 10/23/22 0357 10/23/22 0421 10/23/22 0748  BP: (!) 109/57 (!) 120/53  127/63  Pulse: (!) 57 63  62  Resp: '20 18  20  '$ Temp: 98.8 F (37.1 C) 98.4 F (36.9 C)  98 F (36.7 C)  TempSrc: Oral Oral  Oral  SpO2: 99% 93%  96%  Weight:   83.1 kg   Height:       CBC:  Recent Labs  Lab 10/21/22 2353 10/22/22 0002 10/22/22 0856 10/23/22 0402  WBC 6.7  --   --  6.8  NEUTROABS 3.6  --   --   --   HGB 12.5*   < > 12.2* 11.4*  HCT 36.6*   < > 36.0* 32.6*  MCV 92.9  --   --  89.8  PLT 159  --   --  143*   < > = values in this interval not displayed.    Basic Metabolic Panel:  Recent Labs  Lab 10/21/22 2353 10/22/22 0002 10/22/22 0856 10/23/22 0402  NA 124* 126* 127* 127*  K 4.0 4.2 3.7 3.3*  CL 89* 90*  --  96*  CO2 26  --   --  22  GLUCOSE 107* 100*  --  88  BUN 21 25*  --  13  CREATININE 1.19 1.20  --  0.91  CALCIUM 8.9  --   --  8.2*    Lipid Panel:  Recent Labs  Lab 10/23/22 0402  CHOL 97  TRIG 39  HDL 39*  CHOLHDL 2.5  VLDL 8  LDLCALC 50   HgbA1c:  Recent Labs  Lab 10/22/22 0850  HGBA1C 5.9*   Urine Drug Screen: No results for input(s): "LABOPIA", "COCAINSCRNUR", "LABBENZ", "AMPHETMU", "THCU", "LABBARB" in the last 168 hours.  Alcohol Level No results for input(s): "ETH" in the last 168 hours.  IMAGING past 24 hours ECHOCARDIOGRAM COMPLETE  Result Date: 10/22/2022    ECHOCARDIOGRAM REPORT   Patient Name:   OLSEN MCCUTCHAN Date of Exam: 10/22/2022 Medical Rec #:  [id number]       Height:       69.0 in Accession #:    [id number]      Weight:       173.3 lb Date of Birth:  [birth year]        BSA:          1.944 m Patient Age:    63 years        BP:           125/82 mmHg Patient  Gender: M               HR:           62 bpm. Exam Location:  Inpatient Procedure: 2D Echo, Cardiac Doppler and Color Doppler Indications:    Stroke I63.9  History:        Patient has prior history of Echocardiogram examinations, most                 recent 07/24/2022. CHF, [name], Stroke, Mitral Valve Prolapse,                 Arrythmias:Atrial Fibrillation, Signs/Symptoms:Syncope; Risk  Factors:Hypertension and Sleep Apnea. Pulmonary HTN.  Sonographer:    Ronny Flurry Referring Phys: [id number] [place] Nikolai Wilczak IMPRESSIONS  1. Left ventricular ejection fraction, by estimation, is 55 to 60%. The left ventricle has normal function. The left ventricle has no regional wall motion abnormalities. There is moderate left ventricular hypertrophy. Left ventricular diastolic parameters are indeterminate.  2. Right ventricular systolic function is normal. The right ventricular size is normal. There is moderately elevated pulmonary artery systolic pressure. The estimated right ventricular systolic pressure is 41.9 mmHg.  3. Left atrial size was severely dilated.  4. Right atrial size was severely dilated.  5. The mitral valve is degenerative. Mild mitral valve regurgitation.  6. Tricuspid valve regurgitation is moderate.  7. 2D AVA 1.71 cm2, DVI 0.32 with normal LV stroke volume. At least mild PLFLGAS. The aortic valve is tricuspid. There is mild calcification of the aortic valve. There is mild thickening of the aortic valve. Aortic valve regurgitation is not visualized.  Mild aortic valve stenosis. Aortic valve mean gradient measures 13.0 mmHg.  8. The inferior vena cava is dilated in size with <50% respiratory variability, suggesting right atrial pressure of 15 mmHg. Comparison(s): Improved assessment of LV SV index, Similar AS gradients. RVSP is higher than prior. FINDINGS  Left Ventricle: Left ventricular ejection fraction, by estimation, is 55 to 60%. The left ventricle has normal function. The left ventricle has no  regional wall motion abnormalities. The left ventricular internal cavity size was normal in size. There is  moderate left ventricular hypertrophy. Left ventricular diastolic parameters are indeterminate. Right Ventricle: The right ventricular size is normal. No increase in right ventricular wall thickness. Right ventricular systolic function is normal. There is moderately elevated pulmonary artery systolic pressure. The tricuspid regurgitant velocity is 2.94 m/s, and with an assumed right atrial pressure of 15 mmHg, the estimated right ventricular systolic pressure is 62.2 mmHg. Left Atrium: Left atrial size was severely dilated. Right Atrium: Right atrial size was severely dilated. Pericardium: There is no evidence of pericardial effusion. Mitral Valve: The mitral valve is degenerative in appearance. Mild mitral valve regurgitation. Tricuspid Valve: The tricuspid valve is normal in structure. Tricuspid valve regurgitation is moderate . No evidence of tricuspid stenosis. Aortic Valve: 2D AVA 1.71 cm2, DVI 0.32 with normal LV stroke volume. At least mild PLFLGAS. The aortic valve is tricuspid. There is mild calcification of the aortic valve. There is mild thickening of the aortic valve. Aortic valve regurgitation is not visualized. Mild aortic stenosis is present. Aortic valve mean gradient measures 13.0 mmHg. Aortic valve peak gradient measures 23.1 mmHg. Aortic valve area, by VTI measures 1.32 cm. Pulmonic Valve: The pulmonic valve was normal in structure. Pulmonic valve regurgitation is mild. No evidence of pulmonic stenosis. Aorta: The aortic root and ascending aorta are structurally normal, with no evidence of dilitation. Venous: The inferior vena cava is dilated in size with less than 50% respiratory variability, suggesting right atrial pressure of 15 mmHg. IAS/Shunts: No atrial level shunt detected by color flow Doppler.  LEFT VENTRICLE PLAX 2D LVIDd:         5.00 cm   Diastology LVIDs:         3.60 cm   LV  e' medial:  11.20 cm/s LV PW:         1.40 cm   LV e' lateral: 12.30 cm/s LV IVS:        1.30 cm LVOT diam:     2.30 cm LV SV:  72 LV SV Index:   37 LVOT Area:     4.15 cm  RIGHT VENTRICLE             IVC RV Basal diam:  5.00 cm     IVC diam: 2.70 cm RV S prime:     10.30 cm/s TAPSE (M-mode): 2.0 cm LEFT ATRIUM              Index        RIGHT ATRIUM           Index LA diam:        6.40 cm  3.29 cm/m   RA Area:     39.50 cm LA Vol (A2C):   106.0 ml 54.53 ml/m  RA Volume:   146.00 ml 75.11 ml/m LA Vol (A4C):   96.3 ml  49.54 ml/m LA Biplane Vol: 103.0 ml 52.99 ml/m  AORTIC VALVE AV Area (Vmax):    1.46 cm AV Area (Vmean):   1.33 cm AV Area (VTI):     1.32 cm AV Vmax:           240.50 cm/s AV Vmean:          171.000 cm/s AV VTI:            0.546 m AV Peak Grad:      23.1 mmHg AV Mean Grad:      13.0 mmHg LVOT Vmax:         84.40 cm/s LVOT Vmean:        54.900 cm/s LVOT VTI:          0.173 m LVOT/AV VTI ratio: 0.32  AORTA Ao Root diam: 3.20 cm Ao Asc diam:  3.40 cm TRICUSPID VALVE TR Peak grad:   34.6 mmHg TR Vmax:        294.00 cm/s  SHUNTS Systemic VTI:  0.17 m Systemic Diam: 2.30 cm Rudean Haskell MD Electronically signed by Rudean Haskell MD Signature Date/Time: 10/22/2022/4:32:16 PM    Final    EEG adult  Result Date: 10/22/2022 Derek Jack, MD     10/22/2022  1:31 PM Routine EEG Report Jobanny Stoudt is a 88 y.o. male with a history of altered mental status who is undergoing an EEG to evaluate for seizures. Report: This EEG was acquired with electrodes placed according to the International 10-20 electrode system (including Fp1, Fp2, F3, F4, C3, C4, P3, P4, O1, O2, T3, T4, T5, T6, A1, A2, Fz, Cz, Pz). The following electrodes were missing or displaced: none. The occipital dominant rhythm was 7 Hz. This activity is reactive to stimulation. Drowsiness was manifested by background fragmentation; deeper stages of sleep were not identified. There was focal slowing over the right  hemisphere. There were no interictal epileptiform discharges. There were no electrographic seizures identified. Photic stimulation and hyperventilation were not performed. Impression and clinical correlation: This EEG was obtained while awake and drowsy and is abnormal due to: -  mild diffuse slowing indicative of global cerebral dysfunction - focal slowing over the right hemisphere indicating focal cerebral dysfunction in the region of his known R MCA stroke Epileptiform abnormalities were not seen during this recording. Su Monks, MD [facility] [phone] If 7pm- 7am, please page neurology on call as listed in [place].   MR BRAIN WO CONTRAST  Result Date: 10/22/2022 CLINICAL DATA:  Provided history: Stroke suspected. EXAM: MRI HEAD WITHOUT CONTRAST TECHNIQUE: Multiplanar, multiecho pulse sequences of the brain and surrounding structures were obtained without intravenous contrast. COMPARISON:  Non-contrast head CT and CT angiogram head/neck 10/22/2022. Brain MRI 07/17/2021. FINDINGS: Brain: Moderate generalized cerebral atrophy. Multiple acute cortically-based infarcts within the mid-to-posterior right frontal lobe and right frontal operculum, right parietal lobe, right temporal lobe and lateral right occipital lobe (right MCA territory and right MCA/PCA watershed territory). The largest infarct within the posterior right frontal lobe measures 4.7 cm and involves the precentral gyrus. Unchanged chronic cortical/subcortical infarcts within the left insula, left frontal lobe, left parietal lobe, left occipital lobe and posteromedial left temporal lobe. Chronic blood products associated with the chronic left PCA territory infarct. Chronic lacunar infarct within the right basal ganglia/internal capsule. Background mild multifocal T2 FLAIR hyperintense signal abnormality within the cerebral white matter, nonspecific but compatible with chronic small vessel ischemic disease. Unchanged small chronic  infarcts within the bilateral cerebellar hemispheres. Unchanged chronic hemorrhage within the right cerebellar hemisphere. No evidence of an intracranial mass. No extra-axial fluid collection. No midline shift. Vascular: Maintained flow voids within the proximal large arterial vessels. Skull and upper cervical spine: Right frontal and parietal burr holes. No focal suspicious marrow lesion. Sinuses/Orbits: No mass or acute finding within the imaged orbits. Minimal mucosal thickening within the bilateral ethmoid air cells. Other: Trace fluid within the bilateral mastoid air cells. IMPRESSION: Multiple acute cortically-based infarcts within the right MCA territory and right MCA/PCA watershed territory. The largest acute infarct is located within the posterior right frontal lobe, measuring 4.7 cm and involving the right precentral gyrus. Background parenchymal atrophy, chronic small vessel ischemic disease and chronic infarcts as detailed. Chronic hemorrhage within the right cerebellar hemisphere, unchanged. Chronic blood products also present at site of the chronic left PCA territory infarct. Electronically Signed   By: Kellie Simmering D.O.   On: 10/22/2022 10:29    PHYSICAL EXAM General: in no acute distress HEENT: normocephalic and atraumatic Cardiovascular: regular rate Respiratory: normal respiratory effort and on RA Gastrointestinal: non-tender and non-distended Extremities: moving all extremities spontaneously  Mental Status: Serafin Oelkers is alert but confused; he is oriented to person and oriented to place. Speech was slurred and non-fluent. He was able to follow only simple commands.  Cranial Nerves: II:  Visual fields  could not be properly tested III,IV, VI: no ptosis, extra-ocular motions intact bilaterally V,VII: flattening of L nasolabial fold, smile asymmetric with notable droop on L side XII: midline tongue extension without atrophy and without fasciculations  Motor: Right : Upper  extremity   5/5 full power  Lower extremity   5/5 full power  Left: Upper extremity   5/5 full power Lower extremity   5/5 full power  Tone and bulk: normal tone throughout; no atrophy noted  Sensory: sensation to light touch intact in bilateral LE and UE  Gait: not observed during encounter  ASSESSMENT/PLAN Mr. Hooper Petteway is a 88 year old with a past medical history that includes atrial fibrillation not on anticoagulation due to bilateral subdural bleeds and recurrent falls that required evacuation and also refused Watchman device, coronary artery disease, hyperlipidemia, hypertension, sleep apnea, thrombocytopenia, prior strokes with residual right homonymous hemianopsia as well as residual word finding difficulty brought in for emergent evaluation of left-sided weakness and strokelike symptoms. MRI showed multiple acute cortically-based infarcts within the right MCA territory and right MCA/PCA watershed territory.  Stroke - scattered acute R MCA and MCA/PCA watershed infarcts, embolic due to afib not on [facility]  Code Stroke CT head shows no acute hemorrhage. 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 ASPECTS  10.   CTA head & neck shows No emergent large vessel occlusion or high-grade stenosis of the intracranial arteries. MRI  showed multiple acute cortically-based infarcts within the right MCA territory and right MCA/PCA watershed territory. Chronic hemorrhage within the right cerebellar hemisphere, unchanged. Chronic blood products also present at site of the chronic left PCA territory infarct. 2D Echo EF 55-60% EEG mild diffuse slowing, no seizure LDL 50 HgbA1c 5.9 VTE prophylaxis - heparin subcu No antithrombotic prior to admission, now on aspirin 325 mg. Will discuss with family about [facility] options Therapy recommendations: pending  Disposition:  pending   Chronic A-fib On aspirin at home Not on [facility] due to history of frequent falls and  SDH s/p b/l hematoma evacuation Has refused Watchman device in the past with Dr. [name] cardiology Now on aspirin 325 will need further discussion about [facility] with family in a couple days.  History of strokes 02/2021 left MCA punctate infarct on MRI.  MRA left ICA cervical-petrous moderate stenosis.  Carotid Doppler negative.  EF 45 to 50%, LDL 54, A1c 5.6.  Etiology likely due to A. fib not on [facility], however given patient history of ICH and SAH, discharged on aspirin 81 and Zocor 10. 07/2021 admitted for left large PCA infarct.  CTA head and neck no LVO, no left PCA occlusion.  EF 60 to 65%.  LDL 45 and A1c 5.7.  Continue on aspirin 81 and recommend follow-up with Dr. [name] for Watchman device placement.  However, patient declined Watchman device later.  Hypertension, currently normotensive Home meds:  amlodipine, held at this time Stable Long-term BP goal normotensive  Hyperlipidemia, chronic Home meds:  simvastatin 10 mg, continued on admission LDL 50, goal < 70 High intensity statin not indicated, at goal Continue statin at discharge  Other Stroke Risk Factors Advanced Age >/= 65  [name] Obstructive sleep apnea on CPAP  Other Active Problems Hyponatremia sodium 126->127 Delirium    Hospital day # 1  ATTENDING NOTE: I reviewed above note and agree with the assessment and plan. Pt was seen and examined.   No family at bedside.  Patient lying in bed, intermittently agitated, pulling wires.  Hard of hearing, however orientated to self, place but not to time or age.  Still has moderate to severe dysarthria and left facial droop.  However moving all extremities symmetrically.  Did not pass swallow, n.p.o., core track placed, change aspirin from PR to p.o., started home Zocor.  Will need to further discuss with family regarding anticoagulation options.  PT and OT recommend SNF.  Will follow  For detailed assessment and plan, please refer to above/below as I have made changes wherever  appropriate.   Rosalin Hawking, MD PhD Stroke Neurology 10/23/2022 6:23 PM   To contact Stroke Continuity provider, please refer to [email or web address] After hours, contact General Neurology

## 2022-10-23 NOTE — Evaluation (Signed)
Physical Therapy Evaluation Patient Details Name: Ryan Savage MRN: [id number] DOB: 07-22-1929 Today's Date: 10/23/2022  History of Present Illness  Ryan Savage is a 88 year old with a past medical history that includes atrial fibrillation not on anticoagulation due to bilateral subdural bleeds and recurrent falls that required evacuation, coronary artery disease, hyperlipidemia, hypertension, sleep apnea, thrombocytopenia, prior strokes with residual right homonymous hemianopsia as well as residual word finding difficulty brought in for emergent evaluation of left-sided weakness and strokelike symptoms. MRI showed multiple acute cortically-based infarcts within the right MCA territory and right MCA/PCA watershed territory  Clinical Impression  Pt demonstrates minimal assistance needed for bed mobility and sit to stand this session. He was unable to progress with ambulation due to rigidity and shaky in the UE when standing. Pt previously was ambulating with spouse and able to perform ADL"s independently with extra time. His spouse performed set up for some higher level activities such as finances and medication administration. Pt will benefit from continued skilled physical therapy services on discharge from acute care hospital setting at SNF in order to return home with decreased risk for falls, injury and re-hospitalization.       Recommendations for follow up therapy are one component of a multi-disciplinary discharge planning process, led by the attending physician.  Recommendations may be updated based on patient status, additional functional criteria and insurance authorization.  Follow Up Recommendations Skilled nursing-short term rehab (<3 hours/day) Can patient physically be transported by private vehicle: No    Assistance Recommended at Discharge Frequent or constant Supervision/Assistance  Patient can return home with the following  A lot of help with walking and/or transfers;Help  with stairs or ramp for entrance;Assistance with cooking/housework    Equipment Recommendations Other (comment) (family has needed equipment, defer to post acute)  Recommendations for Other Services       Functional Status Assessment Patient has had a recent decline in their functional status and demonstrates the ability to make significant improvements in function in a reasonable and predictable amount of time.     Precautions / Restrictions Precautions Precautions: Fall;Other (comment) (seizure and aspiration) Restrictions Weight Bearing Restrictions: No      Mobility  Bed Mobility Overal bed mobility: Needs Assistance Bed Mobility: Supine to Sit, Sit to Supine     Supine to sit: Min assist Sit to supine: Min assist   General bed mobility comments: Pt requires min A for bil LE to EOB and then at trunk with verbal/tactile cues for sequencing with supine to sitting and then Min A for bil LE for sitting to supine. Pt requires multi  modal cues for correct sequencing with sitting to supine. Patient Response: Restless  Transfers Overall transfer level: Needs assistance Equipment used: Rolling walker (2 wheels) Transfers: Sit to/from Stand Sit to Stand: Min assist           General transfer comment: Pt was Min A for standing EOB and then was able to scoot up EOB to the L at [place] guarding with verbal cues for hand and LE placement.    Ambulation/Gait               General Gait Details: Did not attempt gait today due to pt was shaking on standing and is currently on seizure precautions. Pt remained awake/alert throughout but was assisted back to sitting.  Stairs            Wheelchair Mobility    Modified Rankin (Stroke Patients Only) Modified Rankin (Stroke Patients Only) Pre-Morbid  Rankin Score: Slight disability Modified Rankin: Severe disability     Balance Overall balance assessment: Needs assistance Sitting-balance support: Bilateral upper extremity  supported Sitting balance-Leahy Scale: Fair     Standing balance support: Bilateral upper extremity supported Standing balance-Leahy Scale: Poor Standing balance comment: Pt UE became very shaky on standing and pt was rigid. Assisted back to sitting position on EOB.                             Pertinent Vitals/Pain Pain Assessment Pain Assessment: Faces Faces Pain Scale: No hurt    Home Living Family/patient expects to be discharged to:: Private residence Living Arrangements: Spouse/significant other Available Help at Discharge: Family;Available 24 hours/day Type of Home: House Home Access: Stairs to enter Entrance Stairs-Rails: Psychiatric nurse of Steps: 7   Home Layout: One level Home Equipment: Grab bars - tub/shower Additional Comments: Pt walks about 20 min with spouse    Prior Function Prior Level of Function : Needs assist  Cognitive Assist : ADLs (cognitive)   ADLs (Cognitive): Intermittent cues               Hand Dominance   Dominant Hand: Right    Extremity/Trunk Assessment   Upper Extremity Assessment Upper Extremity Assessment: Overall WFL for tasks assessed    Lower Extremity Assessment Lower Extremity Assessment: Overall WFL for tasks assessed    Cervical / Trunk Assessment Cervical / Trunk Assessment: Kyphotic  Communication   Communication: Expressive difficulties  Cognition Arousal/Alertness: Awake/alert Behavior During Therapy: Restless Overall Cognitive Status: History of cognitive impairments - at baseline                                 General Comments: Pt was perseverating on removing mitts and reaching for lines               Assessment/Plan    PT Assessment Patient needs continued PT services  PT Problem List Decreased strength;Decreased balance;Decreased activity tolerance;Decreased mobility;Decreased knowledge of use of DME       PT Treatment Interventions DME  instruction;Functional mobility training;Balance training;Patient/family education;Gait training;Therapeutic activities;Neuromuscular re-education;Stair training;Therapeutic exercise;Manual techniques;Cognitive remediation    PT Goals (Current goals can be found in the Care Plan section)  Acute Rehab PT Goals Patient Stated Goal: To rehab to return home PT Goal Formulation: With family Time For Goal Achievement: 11/06/22 Potential to Achieve Goals: Fair    Frequency Min 3X/week        AM-PAC PT "6 Clicks" Mobility  Outcome Measure Help needed turning from your back to your side while in a flat bed without using bedrails?: A Little Help needed moving from lying on your back to sitting on the side of a flat bed without using bedrails?: A Little Help needed moving to and from a bed to a chair (including a wheelchair)?: A Little Help needed standing up from a chair using your arms (e.g., wheelchair or bedside chair)?: A Lot Help needed to walk in hospital room?: A Lot Help needed climbing 3-5 steps with a railing? : A Lot 6 Click Score: 15    End of Session Equipment Utilized During Treatment: Gait belt Activity Tolerance: Patient tolerated treatment well Patient left: in bed;with bed alarm set;Other (comment) (with seizure guards on) Nurse Communication: Mobility status;Other (comment) (IV alarming) PT Visit Diagnosis: Other abnormalities of gait and mobility (R26.89);Muscle weakness (generalized) (M62.81)  Time: 2244-9753 PT Time Calculation (min) (ACUTE ONLY): 25 min   Charges:   PT Evaluation $PT Eval Low Complexity: 1 Low PT Treatments $Therapeutic Activity: 8-22 mins        Tomma Rakers, DPT, CLT  Acute Rehabilitation Services Office: [phone] (Secure chat preferred)   Ander Purpura 10/23/2022, 1:29 PM

## 2022-10-23 NOTE — Plan of Care (Signed)
  Problem: Ischemic Stroke/TIA Tissue Perfusion: Goal: Complications of ischemic stroke/TIA will be minimized Outcome: Progressing   Problem: Coping: Goal: Will verbalize positive feelings about self Outcome: Progressing Goal: Will identify appropriate support needs Outcome: Progressing

## 2022-10-23 NOTE — TOC Initial Note (Signed)
Transition of Care [facility]) - Initial/Assessment Note    Patient Details  Name: Ryan Savage MRN: [id number] Date of Birth: 1929-03-25  [facility] CM/SW Contact:    Pollie Friar, RN Phone Number: 10/23/2022, 2:07 PM  Clinical Narrative:                 CM went to talk with the patient and he was getting a cortrak placed. Patients spouse in the hall and CM spoke with her.  Patient and spouse live together. Pt has trouble with his vision so spouse assists with his medications and does the driving. Pt independent in other ADL's.  Currently the recommendations are for SNF rehab. Plan is to see how his swallowing does to determine next venue. Pt will need to be on a diet or have PEG for SNF rehab placement.  TOC following.   Expected Discharge Plan: Skilled Nursing Facility Barriers to Discharge: Continued Medical Work up   Patient Goals and CMS Choice   CMS Medicare.gov Compare Post Acute Care list provided to:: Patient Represenative (must comment) Choice offered to / list presented to : Spouse  Expected Discharge Plan and Services Expected Discharge Plan: Skilled Nursing Facility In-house Referral: Clinical Social Work Discharge Planning Services: CM Consult Post Acute Care Choice: [place] Living arrangements for the past 2 months: [place]                                      Prior Living Arrangements/Services Living arrangements for the past 2 months: Single Family Home Lives with:: Spouse Patient language and need for interpreter reviewed:: Yes Do you feel safe going back to the place where you live?: Yes          Current home services: DME (cane/ shower seat) Criminal Activity/Legal Involvement Pertinent to Current Situation/Hospitalization: No - Comment as needed  Activities of Daily Living Home Assistive Devices/Equipment: Walker (specify type) ADL Screening (condition at time of admission) Patient's cognitive  ability adequate to safely complete daily activities?: Yes Is the patient deaf or have difficulty hearing?: No Does the patient have difficulty seeing, even when wearing glasses/contacts?: No Does the patient have difficulty concentrating, remembering, or making decisions?: No Patient able to express need for assistance with ADLs?: Yes Does the patient have difficulty dressing or bathing?: No Independently performs ADLs?: Yes (appropriate for developmental age) Does the patient have difficulty walking or climbing stairs?: No Weakness of Legs: None Weakness of Arms/Hands: None  Permission Sought/Granted                  Emotional Assessment Appearance:: Appears stated age         Psych Involvement: No (comment)  Admission diagnosis:  Hyponatremia [E87.1] Change in mental status [R41.82] Altered mental status, unspecified altered mental status type [R41.82] Community acquired pneumonia, unspecified laterality [J18.9] Patient Active Problem List   Diagnosis Date Noted   Change in mental status 10/22/2022   Elevated troponin 11/91/4782   Acute metabolic encephalopathy 95/62/1308   Acute CVA (cerebrovascular accident) ([place]) 03/09/2021   Aortic stenosis 05/22/2018   Pulmonary HTN ([place]) 05/22/2018   Subdural hemorrhage ([place]) 03/25/2018   Subdural hematoma without coma (HCC)    Seizure prophylaxis    Insomnia    Thrombocytopenia ([place])    History of subdural hematoma    Tachypnea    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 ([place])  03/20/2018   Protein-calorie malnutrition, severe 03/20/2018   Traumatic subdural hematoma (HCC) 02/24/2018   Acute blood loss anemia    Steroid-induced hyperglycemia    SIADH 62/83/1517   Chronic systolic CHF (congestive heart failure), NYHA class 2 ([place]) 02/20/2018   Subdural hematoma (HCC) 02/20/2018   SDH (subdural hematoma) (HCC) 01/25/2018   Bilateral subdural hematomas ([place]) 01/23/2018   Malnutrition of moderate degree  12/10/2017   Cerebral embolism with cerebral infarction 12/09/2017   Odynophagia 12/08/2017   Syncope and collapse 12/08/2017   Hyperlipidemia 12/08/2017   OSA (obstructive sleep apnea) 12/08/2017   Dysarthria 12/08/2017   [name] (coronary artery disease) 12/08/2017   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 [place] [address] [place] [address] Phone: [phone] Fax: [phone]  [id number] - [place], [place] - [address] [place] [address] Phone: [phone] Fax: [phone]     Social Determinants of Health (SDOH) Interventions    Readmission Risk Interventions     No data to display

## 2022-10-24 DIAGNOSIS — R7989 Other specified abnormal findings of blood chemistry: Secondary | ICD-10-CM | POA: Diagnosis not present

## 2022-10-24 DIAGNOSIS — G9341 Metabolic encephalopathy: Secondary | ICD-10-CM | POA: Diagnosis not present

## 2022-10-24 DIAGNOSIS — Z8673 Personal history of transient ischemic attack (TIA), and cerebral infarction without residual deficits: Secondary | ICD-10-CM | POA: Diagnosis not present

## 2022-10-24 DIAGNOSIS — I63411 Cerebral infarction due to embolism of right middle cerebral artery: Secondary | ICD-10-CM | POA: Diagnosis not present

## 2022-10-24 DIAGNOSIS — I482 Chronic atrial fibrillation, unspecified: Secondary | ICD-10-CM | POA: Diagnosis not present

## 2022-10-24 LAB — MAGNESIUM: Magnesium: 1.6 mg/dL — ABNORMAL LOW (ref 1.7–2.4)

## 2022-10-24 LAB — GLUCOSE, CAPILLARY
Glucose-Capillary: 181 mg/dL — ABNORMAL HIGH (ref 70–99)
Glucose-Capillary: 166 mg/dL — ABNORMAL HIGH (ref 70–99)
Glucose-Capillary: 185 mg/dL — ABNORMAL HIGH (ref 70–99)
Glucose-Capillary: 154 mg/dL — ABNORMAL HIGH (ref 70–99)
Glucose-Capillary: 188 mg/dL — ABNORMAL HIGH (ref 70–99)

## 2022-10-24 LAB — PHOSPHORUS: Phosphorus: 2.1 mg/dL — ABNORMAL LOW (ref 2.5–4.6)

## 2022-10-24 MED ORDER — HALOPERIDOL LACTATE 5 MG/ML IJ SOLN
1.0000 mg | Freq: Once | INTRAMUSCULAR | Status: AC
  Administered 2022-10-24: 1 mg via INTRAVENOUS
  Filled 2022-10-24: qty 1

## 2022-10-24 MED ORDER — QUETIAPINE FUMARATE 25 MG PO TABS
25.0000 mg | ORAL_TABLET | Freq: Every day | ORAL | Status: DC
  Administered 2022-10-24 – 2022-10-27 (×4): 25 mg via ORAL
  Filled 2022-10-24 (×4): qty 1

## 2022-10-24 MED ORDER — MAGNESIUM SULFATE 2 GM/50ML IV SOLN
2.0000 g | Freq: Once | INTRAVENOUS | Status: AC
  Administered 2022-10-24: 2 g via INTRAVENOUS
  Filled 2022-10-24: qty 50

## 2022-10-24 MED ORDER — POTASSIUM PHOSPHATES 15 MMOLE/5ML IV SOLN
30.0000 mmol | Freq: Once | INTRAVENOUS | Status: AC
  Administered 2022-10-24: 30 mmol via INTRAVENOUS
  Filled 2022-10-24: qty 10

## 2022-10-24 MED ORDER — POTASSIUM CHLORIDE 10 MEQ/100ML IV SOLN
10.0000 meq | INTRAVENOUS | Status: AC
  Administered 2022-10-24 (×4): 10 meq via INTRAVENOUS
  Filled 2022-10-24 (×4): qty 100

## 2022-10-24 NOTE — Progress Notes (Signed)
SLP Cancellation Note  Patient Details Name: Ryan Savage MRN: [id number] DOB: [birth year]   Cancelled treatment:       Reason Eval/Treat Not Completed: Other (comment) (RN reports pt was up all night long, given Ativan and just fell asleep at 0615, has Cortrak, will continue efforts, recommend to engage palliative care to establish [place] given advanced age and lack of progress)  Kathleen Lime, MS [place] Pager [phone]   Macario Golds 10/24/2022, 7:57 AM

## 2022-10-24 NOTE — Plan of Care (Signed)
  Problem: Activity: Goal: Ability to tolerate increased activity will improve Outcome: Progressing   Problem: Respiratory: Goal: Ability to maintain a clear airway will improve Outcome: Progressing   Problem: Nutrition: Goal: Risk of aspiration will decrease Outcome: Progressing Goal: Dietary intake will improve Outcome: Progressing

## 2022-10-24 NOTE — Progress Notes (Signed)
Physical Therapy Treatment Patient Details Name: Ryan Savage MRN: [id number] DOB: 11-15-1928 Today's Date: 10/24/2022   History of Present Illness Ryan Savage is a 88 year old with a past medical history that includes atrial fibrillation not on anticoagulation due to bilateral subdural bleeds and recurrent falls that required evacuation, coronary artery disease, hyperlipidemia, hypertension, sleep apnea, thrombocytopenia, prior strokes with residual right homonymous hemianopsia as well as residual word finding difficulty brought in for emergent evaluation of left-sided weakness and strokelike symptoms. MRI showed multiple acute cortically-based infarcts within the right MCA territory and right MCA/PCA watershed territory    PT Comments    Pt is more lethargic today than yesterday during physical therapy session so was unable to do as much. Son and spouse stated pt had a rough night and was up most of the night. Spouse also reports that pt likes to stay up late and sleep in when at home. Pt was able to assist with bed mobility and sit EOB with wgt shifting. Pt will benefit from continued skilled physical therapy services in SNF setting on discharge from acute in patient hospital setting in order to address strength, balance, endurance to decrease risk for falls, injury and re-hospitalization. Pt demonstrates no sighs/symptoms of cardiac/respiratory distress throughout session but was very lethargic.     Recommendations for follow up therapy are one component of a multi-disciplinary discharge planning process, led by the attending physician.  Recommendations may be updated based on patient status, additional functional criteria and insurance authorization.  Follow Up Recommendations  Skilled nursing-short term rehab (<3 hours/day) Can patient physically be transported by private vehicle: No   Assistance Recommended at Discharge Frequent or constant Supervision/Assistance  Patient can return  home with the following A lot of help with walking and/or transfers;Help with stairs or ramp for entrance;Assistance with cooking/housework   Equipment Recommendations  Other (comment) (defer to post acute)    Recommendations for Other Services       Precautions / Restrictions Precautions Precautions: Fall;Other (comment) Precaution Comments: seizure and aspiration Restrictions Weight Bearing Restrictions: No     Mobility  Bed Mobility Overal bed mobility: Needs Assistance Bed Mobility: Supine to Sit, Sit to Supine     Supine to sit: Mod assist Sit to supine: Mod assist   General bed mobility comments: Pt was more lethargic this morning. Son states that pt had a restless night and spouse reports that pt normally stays up late and sleeps in past 10 am at home.    Transfers       General transfer comment: unable to perform sit to stand today due to lethargy    Ambulation/Gait     General Gait Details: unable to attempt today     Modified Rankin (Stroke Patients Only) Modified Rankin (Stroke Patients Only) Pre-Morbid Rankin Score: Slight disability Modified Rankin: Severe disability     Balance Overall balance assessment: Needs assistance Sitting-balance support: Bilateral upper extremity supported Sitting balance-Leahy Scale: Fair Sitting balance - Comments: initially pt requires more assist today than at initial evaluation yesterday due to lethargy but pt was able to sit EOB and performing wgt shifting at close SBA            Cognition Arousal/Alertness: Lethargic   Overall Cognitive Status: History of cognitive impairments - at baseline                 Pertinent Vitals/Pain Pain Assessment Pain Assessment: Faces Faces Pain Scale: No hurt     PT Goals (current goals can  now be found in the care plan section) Acute Rehab PT Goals Patient Stated Goal: To rehab to return home PT Goal Formulation: With family Time For Goal Achievement:  11/06/22 Potential to Achieve Goals: Fair Progress towards PT goals: Progressing toward goals    Frequency    Min 3X/week      PT Plan Current plan remains appropriate       AM-PAC PT "6 Clicks" Mobility   Outcome Measure  Help needed turning from your back to your side while in a flat bed without using bedrails?: A Little Help needed moving from lying on your back to sitting on the side of a flat bed without using bedrails?: A Little Help needed moving to and from a bed to a chair (including a wheelchair)?: A Lot Help needed standing up from a chair using your arms (e.g., wheelchair or bedside chair)?: A Lot Help needed to walk in hospital room?: A Lot Help needed climbing 3-5 steps with a railing? : A Lot 6 Click Score: 14    End of Session Equipment Utilized During Treatment: Gait belt Activity Tolerance: Patient tolerated treatment well Patient left: in bed;with bed alarm set;Other (comment);with family/visitor present (seizure guards on)   PT Visit Diagnosis: Other abnormalities of gait and mobility (R26.89);Muscle weakness (generalized) (M62.81)     Time: 6761-9509 PT Time Calculation (min) (ACUTE ONLY): 30 min  Charges:  $Therapeutic Activity: 23-37 mins                     Tomma Rakers, DPT, CLT  Acute Rehabilitation Services Office: [phone] (Secure chat preferred)    Ander Purpura 10/24/2022, 11:11 AM

## 2022-10-24 NOTE — Progress Notes (Signed)
PROGRESS NOTE    Ryan Savage  JWJ:[id number] DOB: 1929/05/17 DOA: 10/21/2022 PCP: Lajean Manes, MD   Brief Narrative:  Ryan Savage is a 88 y.o. male with medical history significant of atrial fibrillation not on [place] due to prior hx of bilateral subdural bleeds as well as recurrent falls, of note patient offered watchman previously but refused. Patient also has history significant for coronary artery disease, hyperlipidemia, hypertension, obstructive sleep apnea, thrombocytopenia, CVA with residual visual deficits as well  word finding difficulties who per wife first noted around 10pm evening of presentation was found to be altered/confused - unable to communicate which is not his baseline.  Assessment & Plan:   Principal Problem:   Cerebral embolism with cerebral infarction Active Problems:   Atrial fibrillation, chronic (HCC)   Elevated troponin   Acute metabolic encephalopathy  Acute metabolic encephalopathy, ongoing Secondary to multiple acute CVAs  Rule out concurrent hospital delirium -MRI confirms multiple CVA of the R MCA/PCA - largest is 4.7cm -Neuro following -likely embolic in origin given A-fib unable to tolerate anticoagulation due to previous subdural hematoma -Continue aspirin for now per neurology -Patient's mental status continues to wax and wane, appears to be somewhat worse over the last 24 hours, will attempt to avoid polypharmacy, keep patient awake during the day to encourage sleeping at night.  Acute profound dysphagia Refeeding syndrome -Secondary to above, speech unable to advance diet, remains n.p.o. -Core track placed 10/23/2022 -Continue to follow potassium and Phos and mag, all repleted today  -Confirms patient's prolonged history of dysphagia and poor p.o. intake   History of subdural hematoma status postevacuation bilaterally -Patient has not been on anticoagulation despite his known history of A-fib due to high risk of bleeding    Questionable CAP -Continue ceftriaxone azithromycin for coverage, 5-day course, no indication for steroids -Patient's transiently hypoxic at intake now resolved back on room air   Atrial fib, not on anticoagulation, rate controlled -not on anticoagulation -Remains in A-fib -Continue rate control with beta-blocker -Previously declined Watchman device, as above lengthy discussion with family will need to be had in regards to anticoagulation given patient remains high risk whether he is on anticoagulation or not given his history and above   Hypertension  -Permissive hypertension, resolving, resume medications as indicated -Blood pressure currently   Severe 2 vessel [name] -Continue beta-blocker, statin, on aspirin given above   CHF, reduced ejection fraction -Remains euvolemic, hold diuretics in the interim given poor p.o. intake -likely reinstitute in the next few days as patient's intake improves with core track tube feeds   Pulmonary hypertension, sleep apnea -Not currently on CPAP, will continue to follow clinically   Hyperlipidemia -continue statin    Aortic root ascending Aortic dilation -continue routine surveillance    DVT prophylaxis: SCDs only Code Status: full  Family Communication: wife at bedside  Status is: Inpatient  Dispo: The patient is from: Home              Anticipated d/c is to: SNF              Anticipated d/c date is: 72+ hours              Patient currently not medically stable for discharge, continues to have profound mental status changes, currently requiring NG tube for nutrition, medications, and free water.  Consultants:  Neuro  Procedures:  None  Antimicrobials:  Azithromycin, ceftriaxone x 5 days  Subjective: No acute issues or events overnight, mental status continues to wax  and wane -review of systems limited given patient's dysphasia/somnolence  Objective: Vitals:   10/23/22 2016 10/23/22 2354 10/24/22 0334 10/24/22 0500  BP: (!)  141/85 (!) 145/87 (!) 141/92   Pulse: 97 (!) 54 (!) 102   Resp: '18 20 20   '$ Temp: 98.2 F (36.8 C) 97.9 F (36.6 C) (!) 97.5 F (36.4 C)   TempSrc: Oral Oral Axillary   SpO2: 96% 92% 94%   Weight:    81.5 kg  Height:        Intake/Output Summary (Last 24 hours) at 10/24/2022 0711 Last data filed at 10/24/2022 0500 Gross per 24 hour  Intake --  Output 1400 ml  Net -1400 ml   Filed Weights   10/22/22 1251 10/23/22 0421 10/24/22 0500  Weight: 78.6 kg 83.1 kg 81.5 kg    Examination:  General:  Pleasantly resting in bed, No acute distress.  Difficult to address orientation given patient's speech HEENT:  Normocephalic atraumatic.  Sclerae nonicteric, noninjected.  Extraocular movements intact bilaterally. Neck:  Without mass or deformity.  Trachea is midline. Lungs:  Clear to auscultate bilaterally without rhonchi, wheeze, or rales. Heart:  Regular rate and rhythm.  Without murmurs, rubs, or gallops. Abdomen:  Soft, nontender, nondistended.  Without guarding or rebound. Extremities: Without cyanosis, clubbing, edema, or obvious deformity.  Data Reviewed: I have personally reviewed following labs and imaging studies  CBC: Recent Labs  Lab 10/21/22 2353 10/22/22 0002 10/22/22 0856 10/23/22 0402  WBC 6.7  --   --  6.8  NEUTROABS 3.6  --   --   --   HGB 12.5* 13.3 12.2* 11.4*  HCT 36.6* 39.0 36.0* 32.6*  MCV 92.9  --   --  89.8  PLT 159  --   --  143*    Basic Metabolic Panel: Recent Labs  Lab 10/21/22 2353 10/22/22 0002 10/22/22 0856 10/23/22 0402 10/24/22 0330  NA 124* 126* 127* 127*  --   K 4.0 4.2 3.7 3.3*  --   CL 89* 90*  --  96*  --   CO2 26  --   --  22  --   GLUCOSE 107* 100*  --  88  --   BUN 21 25*  --  13  --   CREATININE 1.19 1.20  --  0.91  --   CALCIUM 8.9  --   --  8.2*  --   MG  --   --   --   --  1.6*  PHOS  --   --   --   --  2.1*    GFR: Estimated Creatinine Clearance: 52.9 mL/min (by C-G formula based on SCr of 0.91 mg/dL). Liver  Function Tests: Recent Labs  Lab 10/21/22 2353 10/23/22 0402  AST 27 41  ALT 15 15  ALKPHOS 55 46  BILITOT 0.9 1.2  PROT 7.2 5.8*  ALBUMIN 3.5 2.9*    No results for input(s): "LIPASE", "AMYLASE" in the last 168 hours. Recent Labs  Lab 10/22/22 0850  AMMONIA 28    Coagulation Profile: Recent Labs  Lab 10/21/22 2353  INR 1.2    Cardiac Enzymes: Recent Labs  Lab 10/22/22 0218  CKTOTAL 105    BNP (last 3 results) No results for input(s): "PROBNP" in the last 8760 hours. HbA1C: Recent Labs    10/22/22 0850  HGBA1C 5.9*    CBG: Recent Labs  Lab 10/23/22 0640 10/23/22 0748 10/23/22 2019 10/23/22 2353 10/24/22 0435  GLUCAP 98 80 115* 141*  181*    Lipid Profile: Recent Labs    10/23/22 0402  CHOL 97  HDL 39*  LDLCALC 50  TRIG 39  CHOLHDL 2.5    Thyroid Function Tests: Recent Labs    10/22/22 0850  TSH 1.301    Anemia Panel: No results for input(s): "VITAMINB12", "FOLATE", "FERRITIN", "TIBC", "IRON", "RETICCTPCT" in the last 72 hours. Sepsis Labs: Recent Labs  Lab 10/22/22 0305 10/22/22 0633  LATICACIDVEN 1.8 1.7     Recent Results (from the past 240 hour(s))  Urine Culture     Status: None   Collection Time: 10/22/22  2:10 AM   Specimen: Urine, Clean Catch  Result Value Ref Range Status   Specimen Description URINE, CLEAN CATCH  Final   Special Requests NONE  Final   Culture   Final    NO GROWTH Performed at [facility], [place] [address], [place], [place] [address]    Report Status 10/23/2022 FINAL  Final  Resp panel by RT-PCR (RSV, Flu A&B, Covid) Anterior Nasal Swab     Status: None   Collection Time: 10/22/22  2:19 AM   Specimen: Anterior Nasal Swab  Result Value Ref Range Status   SARS Coronavirus 2 by RT PCR NEGATIVE NEGATIVE Final    Comment: (NOTE) SARS-CoV-2 target nucleic acids are NOT DETECTED.  The SARS-CoV-2 RNA is generally detectable in upper respiratory specimens during the acute phase of infection.  The lowest concentration of SARS-CoV-2 viral copies this assay can detect is 138 copies/mL. A negative result does not preclude SARS-Cov-2 infection and should not be used as the sole basis for treatment or other patient management decisions. A negative result may occur with  improper specimen collection/handling, submission of specimen other than nasopharyngeal swab, presence of viral mutation(s) within the areas targeted by this assay, and inadequate number of viral copies(<138 copies/mL). A negative result must be combined with clinical observations, patient history, and epidemiological information. The expected result is Negative.  Fact Sheet for Patients:  EntrepreneurPulse.com.au  Fact Sheet for Healthcare Providers:  IncredibleEmployment.be  This test is no t yet approved or cleared by the [place] FDA and  has been authorized for detection and/or diagnosis of SARS-CoV-2 by FDA under an Emergency Use Authorization (EUA). This EUA will remain  in effect (meaning this test can be used) for the duration of the COVID-19 declaration under Section 564(b)(1) of the Act, 21 U.S.C.section 360bbb-3(b)(1), unless the authorization is terminated  or revoked sooner.       Influenza A by PCR NEGATIVE NEGATIVE Final   Influenza B by PCR NEGATIVE NEGATIVE Final    Comment: (NOTE) The Xpert Xpress SARS-CoV-2/FLU/RSV plus assay is intended as an aid in the diagnosis of influenza from Nasopharyngeal swab specimens and should not be used as a sole basis for treatment. Nasal washings and aspirates are unacceptable for Xpert Xpress SARS-CoV-2/FLU/RSV testing.  Fact Sheet for Patients: EntrepreneurPulse.com.au  Fact Sheet for Healthcare Providers: IncredibleEmployment.be  This test is not yet approved or cleared by the [place] FDA and has been authorized for detection and/or diagnosis of SARS-CoV-2 by FDA  under an Emergency Use Authorization (EUA). This EUA will remain in effect (meaning this test can be used) for the duration of the COVID-19 declaration under Section 564(b)(1) of the Act, 21 U.S.C. section 360bbb-3(b)(1), unless the authorization is terminated or revoked.     Resp Syncytial Virus by PCR NEGATIVE NEGATIVE Final    Comment: (NOTE) Fact Sheet for Patients: EntrepreneurPulse.com.au  Fact  Sheet for Healthcare Providers: IncredibleEmployment.be  This test is not yet approved or cleared by the [place] and has been authorized for detection and/or diagnosis of SARS-CoV-2 by FDA under an Emergency Use Authorization (EUA). This EUA will remain in effect (meaning this test can be used) for the duration of the COVID-19 declaration under Section 564(b)(1) of the Act, 21 U.S.C. section 360bbb-3(b)(1), unless the authorization is terminated or revoked.  Performed at [facility], [place] [address], [place], [place] [address]   Blood Culture (routine x 2)     Status: None (Preliminary result)   Collection Time: 10/22/22  3:00 AM   Specimen: BLOOD  Result Value Ref Range Status   Specimen Description BLOOD SITE NOT SPECIFIED  Final   Special Requests   Final    BOTTLES DRAWN AEROBIC AND ANAEROBIC Blood Culture results may not be optimal due to an inadequate volume of blood received in culture bottles   Culture   Final    NO GROWTH 1 DAY Performed at [facility], [place] [address] [place], [place] [address]    Report Status PENDING  Incomplete  Blood Culture (routine x 2)     Status: None (Preliminary result)   Collection Time: 10/22/22  6:33 AM   Specimen: BLOOD  Result Value Ref Range Status   Specimen Description BLOOD SITE NOT SPECIFIED  Final   Special Requests   Final    BOTTLES DRAWN AEROBIC AND ANAEROBIC Blood Culture adequate volume   Culture   Final    NO GROWTH 1 DAY Performed at [facility], [place] [address] [place], [place] [address]    Report Status PENDING  Incomplete         Radiology Studies: DG Abd Portable 1V  Result Date: 10/23/2022 CLINICAL DATA:  [id number] Encounter for feeding tube placement [id number] EXAM: PORTABLE ABDOMEN - 1 VIEW COMPARISON:  Chest from previous day FINDINGS: Weighted tip feeding tube has been advanced to the gastric antrum. Stomach is decompressed. Visualized bowel gas pattern normal. Lower abdomen excluded. Coarse interstitial and airspace opacities in the visualized lung bases as before. Spondylitic changes in the thoracolumbar spine. IMPRESSION: Feeding tube advanced to the gastric antrum. Electronically Signed   By: Lucrezia Europe M.D.   On: 10/23/2022 15:03   ECHOCARDIOGRAM COMPLETE  Result Date: 10/22/2022    ECHOCARDIOGRAM REPORT   Patient Name:   Ryan Savage Date of Exam: 10/22/2022 Medical Rec #:  [id number]       Height:       69.0 in Accession #:    [id number]      Weight:       173.3 lb Date of Birth:  25-Nov-1928        BSA:          1.944 m Patient Age:    36 years        BP:           125/82 mmHg Patient Gender: M               HR:           62 bpm. Exam Location:  Inpatient Procedure: 2D Echo, Cardiac Doppler and Color Doppler Indications:    Stroke I63.9  History:        Patient has prior history of Echocardiogram examinations, most                 recent 07/24/2022. CHF, [name], Stroke, Mitral Valve  Prolapse,                 Arrythmias:Atrial Fibrillation, Signs/Symptoms:Syncope; Risk                 Factors:Hypertension and Sleep Apnea. Pulmonary HTN.  Sonographer:    Ronny Flurry Referring Phys: [id number] [place] XU IMPRESSIONS  1. Left ventricular ejection fraction, by estimation, is 55 to 60%. The left ventricle has normal function. The left ventricle has no regional wall motion abnormalities. There is moderate left ventricular hypertrophy. Left ventricular diastolic parameters are indeterminate.  2. Right ventricular systolic function is normal. The  right ventricular size is normal. There is moderately elevated pulmonary artery systolic pressure. The estimated right ventricular systolic pressure is 65.7 mmHg.  3. Left atrial size was severely dilated.  4. Right atrial size was severely dilated.  5. The mitral valve is degenerative. Mild mitral valve regurgitation.  6. Tricuspid valve regurgitation is moderate.  7. 2D AVA 1.71 cm2, DVI 0.32 with normal LV stroke volume. At least mild PLFLGAS. The aortic valve is tricuspid. There is mild calcification of the aortic valve. There is mild thickening of the aortic valve. Aortic valve regurgitation is not visualized.  Mild aortic valve stenosis. Aortic valve mean gradient measures 13.0 mmHg.  8. The inferior vena cava is dilated in size with <50% respiratory variability, suggesting right atrial pressure of 15 mmHg. Comparison(s): Improved assessment of LV SV index, Similar AS gradients. RVSP is higher than prior. FINDINGS  Left Ventricle: Left ventricular ejection fraction, by estimation, is 55 to 60%. The left ventricle has normal function. The left ventricle has no regional wall motion abnormalities. The left ventricular internal cavity size was normal in size. There is  moderate left ventricular hypertrophy. Left ventricular diastolic parameters are indeterminate. Right Ventricle: The right ventricular size is normal. No increase in right ventricular wall thickness. Right ventricular systolic function is normal. There is moderately elevated pulmonary artery systolic pressure. The tricuspid regurgitant velocity is 2.94 m/s, and with an assumed right atrial pressure of 15 mmHg, the estimated right ventricular systolic pressure is 84.6 mmHg. Left Atrium: Left atrial size was severely dilated. Right Atrium: Right atrial size was severely dilated. Pericardium: There is no evidence of pericardial effusion. Mitral Valve: The mitral valve is degenerative in appearance. Mild mitral valve regurgitation. Tricuspid Valve: The  tricuspid valve is normal in structure. Tricuspid valve regurgitation is moderate . No evidence of tricuspid stenosis. Aortic Valve: 2D AVA 1.71 cm2, DVI 0.32 with normal LV stroke volume. At least mild PLFLGAS. The aortic valve is tricuspid. There is mild calcification of the aortic valve. There is mild thickening of the aortic valve. Aortic valve regurgitation is not visualized. Mild aortic stenosis is present. Aortic valve mean gradient measures 13.0 mmHg. Aortic valve peak gradient measures 23.1 mmHg. Aortic valve area, by VTI measures 1.32 cm. Pulmonic Valve: The pulmonic valve was normal in structure. Pulmonic valve regurgitation is mild. No evidence of pulmonic stenosis. Aorta: The aortic root and ascending aorta are structurally normal, with no evidence of dilitation. Venous: The inferior vena cava is dilated in size with less than 50% respiratory variability, suggesting right atrial pressure of 15 mmHg. IAS/Shunts: No atrial level shunt detected by color flow Doppler.  LEFT VENTRICLE PLAX 2D LVIDd:         5.00 cm   Diastology LVIDs:         3.60 cm   LV e' medial:  11.20 cm/s LV PW:  1.40 cm   LV e' lateral: 12.30 cm/s LV IVS:        1.30 cm LVOT diam:     2.30 cm LV SV:         72 LV SV Index:   37 LVOT Area:     4.15 cm  RIGHT VENTRICLE             IVC RV Basal diam:  5.00 cm     IVC diam: 2.70 cm RV S prime:     10.30 cm/s TAPSE (M-mode): 2.0 cm LEFT ATRIUM              Index        RIGHT ATRIUM           Index LA diam:        6.40 cm  3.29 cm/m   RA Area:     39.50 cm LA Vol (A2C):   106.0 ml 54.53 ml/m  RA Volume:   146.00 ml 75.11 ml/m LA Vol (A4C):   96.3 ml  49.54 ml/m LA Biplane Vol: 103.0 ml 52.99 ml/m  AORTIC VALVE AV Area (Vmax):    1.46 cm AV Area (Vmean):   1.33 cm AV Area (VTI):     1.32 cm AV Vmax:           240.50 cm/s AV Vmean:          171.000 cm/s AV VTI:            0.546 m AV Peak Grad:      23.1 mmHg AV Mean Grad:      13.0 mmHg LVOT Vmax:         84.40 cm/s LVOT  Vmean:        54.900 cm/s LVOT VTI:          0.173 m LVOT/AV VTI ratio: 0.32  AORTA Ao Root diam: 3.20 cm Ao Asc diam:  3.40 cm TRICUSPID VALVE TR Peak grad:   34.6 mmHg TR Vmax:        294.00 cm/s  SHUNTS Systemic VTI:  0.17 m Systemic Diam: 2.30 cm Rudean Haskell MD Electronically signed by Rudean Haskell MD Signature Date/Time: 10/22/2022/4:32:16 PM    Final    EEG adult  Result Date: 10/22/2022 Derek Jack, MD     10/22/2022  1:31 PM Routine EEG Report Ryan Savage is a 88 y.o. male with a history of altered mental status who is undergoing an EEG to evaluate for seizures. Report: This EEG was acquired with electrodes placed according to the International 10-20 electrode system (including Fp1, Fp2, F3, F4, C3, C4, P3, P4, O1, O2, T3, T4, T5, T6, A1, A2, Fz, Cz, Pz). The following electrodes were missing or displaced: none. The occipital dominant rhythm was 7 Hz. This activity is reactive to stimulation. Drowsiness was manifested by background fragmentation; deeper stages of sleep were not identified. There was focal slowing over the right hemisphere. There were no interictal epileptiform discharges. There were no electrographic seizures identified. Photic stimulation and hyperventilation were not performed. Impression and clinical correlation: This EEG was obtained while awake and drowsy and is abnormal due to: -  mild diffuse slowing indicative of global cerebral dysfunction - focal slowing over the right hemisphere indicating focal cerebral dysfunction in the region of his known R MCA stroke Epileptiform abnormalities were not seen during this recording. Su Monks, MD [facility] [phone] If 7pm- 7am, please page neurology on call as listed in [place].  MR BRAIN WO CONTRAST  Result Date: 10/22/2022 CLINICAL DATA:  Provided history: Stroke suspected. EXAM: MRI HEAD WITHOUT CONTRAST TECHNIQUE: Multiplanar, multiecho pulse sequences of the brain and surrounding  structures were obtained without intravenous contrast. COMPARISON:  Non-contrast head CT and CT angiogram head/neck 10/22/2022. Brain MRI 07/17/2021. FINDINGS: Brain: Moderate generalized cerebral atrophy. Multiple acute cortically-based infarcts within the mid-to-posterior right frontal lobe and right frontal operculum, right parietal lobe, right temporal lobe and lateral right occipital lobe (right MCA territory and right MCA/PCA watershed territory). The largest infarct within the posterior right frontal lobe measures 4.7 cm and involves the precentral gyrus. Unchanged chronic cortical/subcortical infarcts within the left insula, left frontal lobe, left parietal lobe, left occipital lobe and posteromedial left temporal lobe. Chronic blood products associated with the chronic left PCA territory infarct. Chronic lacunar infarct within the right basal ganglia/internal capsule. Background mild multifocal T2 FLAIR hyperintense signal abnormality within the cerebral white matter, nonspecific but compatible with chronic small vessel ischemic disease. Unchanged small chronic infarcts within the bilateral cerebellar hemispheres. Unchanged chronic hemorrhage within the right cerebellar hemisphere. No evidence of an intracranial mass. No extra-axial fluid collection. No midline shift. Vascular: Maintained flow voids within the proximal large arterial vessels. Skull and upper cervical spine: Right frontal and parietal burr holes. No focal suspicious marrow lesion. Sinuses/Orbits: No mass or acute finding within the imaged orbits. Minimal mucosal thickening within the bilateral ethmoid air cells. Other: Trace fluid within the bilateral mastoid air cells. IMPRESSION: Multiple acute cortically-based infarcts within the right MCA territory and right MCA/PCA watershed territory. The largest acute infarct is located within the posterior right frontal lobe, measuring 4.7 cm and involving the right precentral gyrus. Background  parenchymal atrophy, chronic small vessel ischemic disease and chronic infarcts as detailed. Chronic hemorrhage within the right cerebellar hemisphere, unchanged. Chronic blood products also present at site of the chronic left PCA territory infarct. Electronically Signed   By: Kellie Simmering D.O.   On: 10/22/2022 10:29    Scheduled Meds:  aspirin  325 mg Per Tube Daily   free water  50 mL Per Tube Q6H   heparin injection (subcutaneous)  5,000 Units Subcutaneous Q8H   simvastatin  10 mg Per NG tube QPM   Continuous Infusions:  sodium chloride 100 mL/hr at 10/23/22 2224   azithromycin 500 mg (10/24/22 0121)   cefTRIAXone (ROCEPHIN)  IV 1 g (10/24/22 0040)   feeding supplement (OSMOLITE 1.2 CAL) 1,000 mL (10/23/22 1753)    LOS: 2 days   Time spent: 76mn  Babak Lucus C Earley Grobe, DO [facility]  If 7PM-7AM, please contact night-coverage [email or web address]  10/24/2022, 7:11 AM

## 2022-10-24 NOTE — Progress Notes (Addendum)
STROKE TEAM PROGRESS NOTE   INTERVAL HISTORY Patient evaluated at bedside in presence of family. Patient was somnolent, difficult to rouse to voice or vigorous touch. Neuro exam limited today.  Vitals:   10/23/22 2354 10/24/22 0334 10/24/22 0500 10/24/22 0737  BP: (!) 145/87 (!) 141/92  (!) 142/73  Pulse: (!) 54 (!) 102  94  Resp: '20 20  18  '$ Temp: 97.9 F (36.6 C) (!) 97.5 F (36.4 C)  98.7 F (37.1 C)  TempSrc: Oral Axillary  Oral  SpO2: 92% 94%  98%  Weight:   81.5 kg   Height:       CBC:  Recent Labs  Lab 10/21/22 2353 10/22/22 0002 10/22/22 0856 10/23/22 0402  WBC 6.7  --   --  6.8  NEUTROABS 3.6  --   --   --   HGB 12.5*   < > 12.2* 11.4*  HCT 36.6*   < > 36.0* 32.6*  MCV 92.9  --   --  89.8  PLT 159  --   --  143*   < > = values in this interval not displayed.    Basic Metabolic Panel:  Recent Labs  Lab 10/21/22 2353 10/22/22 0002 10/22/22 0856 10/23/22 0402 10/24/22 0330  NA 124* 126* 127* 127*  --   K 4.0 4.2 3.7 3.3*  --   CL 89* 90*  --  96*  --   CO2 26  --   --  22  --   GLUCOSE 107* 100*  --  88  --   BUN 21 25*  --  13  --   CREATININE 1.19 1.20  --  0.91  --   CALCIUM 8.9  --   --  8.2*  --   MG  --   --   --   --  1.6*  PHOS  --   --   --   --  2.1*    Lipid Panel:  Recent Labs  Lab 10/23/22 0402  CHOL 97  TRIG 39  HDL 39*  CHOLHDL 2.5  VLDL 8  LDLCALC 50    HgbA1c:  Recent Labs  Lab 10/22/22 0850  HGBA1C 5.9*    Urine Drug Screen: No results for input(s): "LABOPIA", "COCAINSCRNUR", "LABBENZ", "AMPHETMU", "THCU", "LABBARB" in the last 168 hours.  Alcohol Level No results for input(s): "ETH" in the last 168 hours.  IMAGING past 24 hours DG Abd Portable 1V  Result Date: 10/23/2022 CLINICAL DATA:  [id number] Encounter for feeding tube placement [id number] EXAM: PORTABLE ABDOMEN - 1 VIEW COMPARISON:  Chest from previous day FINDINGS: Weighted tip feeding tube has been advanced to the gastric antrum. Stomach is decompressed.  Visualized bowel gas pattern normal. Lower abdomen excluded. Coarse interstitial and airspace opacities in the visualized lung bases as before. Spondylitic changes in the thoracolumbar spine. IMPRESSION: Feeding tube advanced to the gastric antrum. Electronically Signed   By: Lucrezia Europe M.D.   On: 10/23/2022 15:03    PHYSICAL EXAM General: in no acute distress HEENT: normocephalic and atraumatic Cardiovascular: regular rate Respiratory: normal respiratory effort and on RA Gastrointestinal: non-tender and non-distended Extremities: moving all extremities spontaneously  Mental Status: Ryan Savage is somnolent, difficult to rouse to voice / touch; he is not oriented to person, place, time, or situation. He was unable to follow any commands.  Cranial Nerves: II:  Visual fields  could not be properly tested III,IV, VI: unable to assess V,VII: flattening of L nasolabial fold, notable droop  on L side  Motor: Unable to assess  Tone and bulk: normal tone throughout; no atrophy noted  Sensory: unable to assess  Gait: not observed during encounter  ASSESSMENT/PLAN [name] is a 88 year old with a past medical history that includes atrial fibrillation not on anticoagulation due to bilateral subdural bleeds and recurrent falls that required evacuation and also refused Watchman device, coronary artery disease, hyperlipidemia, hypertension, sleep apnea, thrombocytopenia, prior strokes with residual right homonymous hemianopsia as well as residual word finding difficulty brought in for emergent evaluation of left-sided weakness and strokelike symptoms. MRI showed multiple acute cortically-based infarcts within the right MCA territory and right MCA/PCA watershed territory.  Stroke - scattered acute R MCA and MCA/PCA watershed infarcts, embolic due to afib not on [facility]  Code Stroke CT head shows no acute hemorrhage. Old left PCA territory infarct and multiple old small vessel infarcts of the  cerebellum. ASPECTS 10.   CTA head & neck shows No emergent large vessel occlusion or high-grade stenosis of the intracranial arteries. MRI  showed multiple acute cortically-based infarcts within the right MCA territory and right MCA/PCA watershed territory. Chronic hemorrhage within the right cerebellar hemisphere, unchanged. Chronic blood products also present at site of the chronic left PCA territory infarct. 2D Echo EF 55-60% EEG mild diffuse slowing, no seizure LDL 50 HgbA1c 5.9 VTE prophylaxis - heparin subcu No antithrombotic prior to admission, now on aspirin 325 mg per tube. Pt now has lower risk of fall, will consider starting AC once pass swallow or PEG done Therapy recommendations: SNF Disposition:  pending   Chronic A-fib On aspirin at home Not on [facility] due to history of frequent falls and SDH s/p b/l hematoma evacuation Has refused Watchman device in the past with Dr. [name] cardiology Now on aspirin 325 Discussed with wife and son, now pt risk of fall lowered, will consider starting [facility] for stroke prevention once able to swallow or has PEG done.  History of strokes 02/2021 left MCA punctate infarct on MRI.  MRA left ICA cervical-petrous moderate stenosis.  Carotid Doppler negative.  EF 45 to 50%, LDL 54, A1c 5.6.  Etiology likely due to A. fib not on [facility], however given patient history of ICH and SAH, discharged on aspirin 81 and Zocor 10. 07/2021 admitted for left large PCA infarct.  CTA head and neck no LVO, no left PCA occlusion.  EF 60 to 65%.  LDL 45 and A1c 5.7.  Continue on aspirin 81 and recommend follow-up with Dr. [name] for Watchman device placement.  However, patient declined Watchman device later.  Hypertension, currently normotensive Home meds:  amlodipine, held at this time Stable Long-term BP goal normotensive  Hyperlipidemia, chronic Home meds:  simvastatin 10 mg, continued on admission LDL 50, goal < 70 High intensity statin not indicated as LDL at  goal Continue statin at discharge  Other Stroke Risk Factors Advanced Age >/= 25  [name] Obstructive sleep apnea on CPAP  Other Active Problems Hyponatremia sodium 126->127->127 Delirium with sleep-wake cycle disturbance -added Seroquel nightly   Hospital day # 2  ATTENDING NOTE: I reviewed above note and agree with the assessment and plan. Pt was seen and examined.   Wife and son at bedside.  Patient has sleep weak cycle disturbance, not sleeping at night with agitation delirium, however sleep throughout the day not able to work with the therapist.  Will add Seroquel nightly.  Discussed with family regarding anticoagulation options, they are in agreement to start anticoagulation given currently fall  risk becomes low.  However, patient did not pass swallow, pending SNF placement, may need to consider PEG tube if not able to pass a swallow.  Would recommend start anticoagulation once passes swallow or once PEG tube done.  Will follow.  For detailed assessment and plan, please refer to above/below as I have made changes wherever appropriate.   Rosalin Hawking, MD PhD Stroke Neurology 10/24/2022 7:47 PM    To contact Stroke Continuity provider, please refer to [email or web address] After hours, contact General Neurology

## 2022-10-25 DIAGNOSIS — Z8673 Personal history of transient ischemic attack (TIA), and cerebral infarction without residual deficits: Secondary | ICD-10-CM | POA: Diagnosis not present

## 2022-10-25 DIAGNOSIS — I63411 Cerebral infarction due to embolism of right middle cerebral artery: Secondary | ICD-10-CM | POA: Diagnosis not present

## 2022-10-25 DIAGNOSIS — I482 Chronic atrial fibrillation, unspecified: Secondary | ICD-10-CM | POA: Diagnosis not present

## 2022-10-25 LAB — PHOSPHORUS: Phosphorus: 2.4 mg/dL — ABNORMAL LOW (ref 2.5–4.6)

## 2022-10-25 LAB — GLUCOSE, CAPILLARY
Glucose-Capillary: 141 mg/dL — ABNORMAL HIGH (ref 70–99)
Glucose-Capillary: 154 mg/dL — ABNORMAL HIGH (ref 70–99)
Glucose-Capillary: 165 mg/dL — ABNORMAL HIGH (ref 70–99)
Glucose-Capillary: 166 mg/dL — ABNORMAL HIGH (ref 70–99)
Glucose-Capillary: 178 mg/dL — ABNORMAL HIGH (ref 70–99)
Glucose-Capillary: 182 mg/dL — ABNORMAL HIGH (ref 70–99)

## 2022-10-25 LAB — BASIC METABOLIC PANEL
Anion gap: 9 (ref 5–15)
BUN: 10 mg/dL (ref 8–23)
CO2: 20 mmol/L — ABNORMAL LOW (ref 22–32)
Calcium: 7.9 mg/dL — ABNORMAL LOW (ref 8.9–10.3)
Chloride: 98 mmol/L (ref 98–111)
Creatinine, Ser: 0.72 mg/dL (ref 0.61–1.24)
GFR, Estimated: >60 mL/min (ref >60)
Glucose, Bld: 145 mg/dL — ABNORMAL HIGH (ref 70–99)
Potassium: 3.6 mmol/L (ref 3.5–5.1)
Sodium: 127 mmol/L — ABNORMAL LOW (ref 135–145)

## 2022-10-25 LAB — MAGNESIUM: Magnesium: 2 mg/dL (ref 1.7–2.4)

## 2022-10-25 MED ORDER — THIAMINE MONONITRATE 100 MG PO TABS
100.0000 mg | ORAL_TABLET | Freq: Every day | ORAL | Status: AC
  Administered 2022-10-25 – 2022-10-29 (×5): 100 mg
  Filled 2022-10-25 (×5): qty 1

## 2022-10-25 MED ORDER — INSULIN ASPART 100 UNIT/ML IJ SOLN
0.0000 [IU] | INTRAMUSCULAR | Status: DC
  Administered 2022-10-26 – 2022-10-31 (×14): 1 [IU] via SUBCUTANEOUS

## 2022-10-25 MED ORDER — INSULIN ASPART 100 UNIT/ML IJ SOLN: 0.0000 [IU] | Freq: Three times a day (TID) | INTRAMUSCULAR | Status: DC

## 2022-10-25 NOTE — Plan of Care (Signed)
  Problem: Education: Goal: Knowledge of secondary prevention will improve (MUST DOCUMENT ALL) Outcome: Progressing Goal: Knowledge of patient specific risk factors will improve Elta [place] N/A or DELETE if not current risk factor) Outcome: Progressing   Problem: Ischemic Stroke/TIA Tissue Perfusion: Goal: Complications of ischemic stroke/TIA will be minimized Outcome: Progressing   Problem: Fluid Volume: Goal: Ability to maintain a balanced intake and output will improve Outcome: Progressing   Problem: Nutritional: Goal: Maintenance of adequate nutrition will improve Outcome: Progressing   Problem: Skin Integrity: Goal: Risk for impaired skin integrity will decrease Outcome: Progressing

## 2022-10-25 NOTE — Care Management Important Message (Signed)
Important Message  Patient Details  Name: Ryan Savage MRN: [id number] Date of Birth: July 25, 1929   Medicare Important Message Given:  Yes     Anica Alcaraz 10/25/2022, 1:54 PM

## 2022-10-25 NOTE — TOC Progression Note (Signed)
Transition of Care [facility]) - Progression Note    Patient Details  Name: Ryan Savage MRN: [id number] Date of Birth: October 01, 1929  Transition of Care [facility] CM/SW Contact  Jinger Neighbors, [place] Phone Number: 10/25/2022, 10:54 AM  Clinical Narrative:    Pt still with cortrak. TOC following    Expected Discharge Plan: [place] Barriers to Discharge: Continued Medical Work up  Expected Discharge Plan and Services Expected Discharge Plan: [place] In-house Referral: Clinical Social Work Discharge Planning Services: CM Consult Post Acute Care Choice: [place] arrangements for the past 2 months: Single Family Home                                       Social Determinants of Health (SDOH) Interventions    Readmission Risk Interventions     No data to display

## 2022-10-25 NOTE — Progress Notes (Addendum)
PROGRESS NOTE    Ryan Savage  LMB:[id number] DOB: 1928-11-27 DOA: 10/21/2022 PCP: Lajean Manes, MD   Brief Narrative:  Ryan Savage is a 88 y.o. male with medical history significant of atrial fibrillation not on [place] due to prior hx of bilateral subdural bleeds as well as recurrent falls, of note patient offered watchman previously but refused. Patient also has history significant for coronary artery disease, hyperlipidemia, hypertension, obstructive sleep apnea, thrombocytopenia, CVA with residual visual deficits as well  word finding difficulties who per wife first noted around 10pm evening of presentation was found to be altered/confused - unable to communicate which is not his baseline.  Assessment & Plan:   Principal Problem:   Cerebral embolism with cerebral infarction Active Problems:   Atrial fibrillation, chronic (HCC)   Elevated troponin   Acute metabolic encephalopathy  Acute metabolic encephalopathy, ongoing Secondary to multiple acute CVAs Rule out concurrent hospital delirium -MRI confirms multiple CVA of the R MCA/PCA - largest is 4.7cm -Neuro following -likely embolic in origin given A-fib unable to tolerate anticoagulation due to previous subdural hematoma -Continue aspirin for now per neurology -Patient's mental status continues to wax and wane, minimally improving with Seroquel, no issues overnight  Acute profound dysphagia Refeeding syndrome -Secondary to above, speech continues to follow, apparently more oriented and interactive today hopeful patient will be able to recover some of his oral intake -Core track placed 10/23/2022 -Continue to follow potassium and Phos and mag, replace as appropriate -Lengthy discussion with family today over the phone about possible need for PEG tube placement, at this time family feels the patient would not want to advance to PEG tube if it was necessary but now that he is more awake and alert today family will attempt to  have this conversation specifically around feeding tube with the patient to ensure his wishes are fulfilled.   History of subdural hematoma status postevacuation bilaterally -Patient has not been on anticoagulation despite his known history of A-fib due to high risk of bleeding   Questionable CAP -Continue ceftriaxone azithromycin for coverage, 5-day course, no indication for steroids -Patient's transiently hypoxic at intake now resolved back on room air   Atrial fib, not on anticoagulation, rate controlled -not on anticoagulation -Remains in A-fib -Continue rate control with beta-blocker -Previously declined Watchman device, as above lengthy discussion with family will need to be had in regards to anticoagulation given patient remains high risk whether he is on anticoagulation or not given his history and above   Hypertension  -Permissive hypertension, resolving, resume medications as indicated -Blood pressure currently well-controlled off all prior meds   Severe 2 vessel [name] -Continue beta-blocker, statin, on aspirin given above   CHF, reduced ejection fraction -Remains euvolemic, hold diuretics, increase intake via NG tube and tube feeds with free water flushes   Pulmonary hypertension, sleep apnea -Not currently on CPAP, will continue to follow clinically   Hyperlipidemia -continue statin    Aortic root ascending Aortic dilation -continue routine surveillance    DVT prophylaxis: SCDs only Code Status: full  Family Communication: wife at bedside  Status is: Inpatient  Dispo: The patient is from: Home              Anticipated d/c is to: SNF              Anticipated d/c date is: 72+ hours              Patient currently not medically stable for discharge, continues to have profound  mental status changes, currently requiring NG tube for nutrition, medications, and free water.  Consultants:  Neuro  Procedures:  None  Antimicrobials:  Azithromycin, ceftriaxone x 5  days  Subjective: No acute issues or events overnight, sleeping better overnight per report still somewhat confused and rambling this morning with unintelligible speech but much more awake than previous.  Objective: Vitals:   10/24/22 1134 10/24/22 1558 10/25/22 0011 10/25/22 0339  BP: (!) 141/79 132/76 127/74 (!) 122/92  Pulse: 74 84 98 94  Resp: '18 18 19 20  '$ Temp: 98.3 F (36.8 C) 98 F (36.7 C) 97.9 F (36.6 C) 98.4 F (36.9 C)  TempSrc: Oral Oral Oral Oral  SpO2: 98% 96% 97% 97%  Weight:      Height:        Intake/Output Summary (Last 24 hours) at 10/25/2022 0756 Last data filed at 10/25/2022 0300 Gross per 24 hour  Intake 6871.46 ml  Output 700 ml  Net 6171.46 ml    Filed Weights   10/22/22 1251 10/23/22 0421 10/24/22 0500  Weight: 78.6 kg 83.1 kg 81.5 kg    Examination:  General:  Pleasantly resting in bed, No acute distress.  Difficult to address orientation given patient's speech HEENT:  Normocephalic atraumatic.  NG tube intact Neck:  Without mass or deformity.  Trachea is midline. Lungs:  Clear to auscultate bilaterally without rhonchi, wheeze, or rales. Heart:  Regular rate and rhythm.  Without murmurs, rubs, or gallops. Abdomen:  Soft, nontender, nondistended.  Without guarding or rebound. Extremities: Without cyanosis, clubbing, edema, or obvious deformity.  Data Reviewed: I have personally reviewed following labs and imaging studies  CBC: Recent Labs  Lab 10/21/22 2353 10/22/22 0002 10/22/22 0856 10/23/22 0402  WBC 6.7  --   --  6.8  NEUTROABS 3.6  --   --   --   HGB 12.5* 13.3 12.2* 11.4*  HCT 36.6* 39.0 36.0* 32.6*  MCV 92.9  --   --  89.8  PLT 159  --   --  143*    Basic Metabolic Panel: Recent Labs  Lab 10/21/22 2353 10/22/22 0002 10/22/22 0856 10/23/22 0402 10/24/22 0330 10/25/22 0423  NA 124* 126* 127* 127*  --  127*  K 4.0 4.2 3.7 3.3*  --  3.6  CL 89* 90*  --  96*  --  98  CO2 26  --   --  22  --  20*  GLUCOSE 107* 100*   --  88  --  145*  BUN 21 25*  --  13  --  10  CREATININE 1.19 1.20  --  0.91  --  0.72  CALCIUM 8.9  --   --  8.2*  --  7.9*  MG  --   --   --   --  1.6* 2.0  PHOS  --   --   --   --  2.1* 2.4*    GFR: Estimated Creatinine Clearance: 60.1 mL/min (by C-G formula based on SCr of 0.72 mg/dL). Liver Function Tests: Recent Labs  Lab 10/21/22 2353 10/23/22 0402  AST 27 41  ALT 15 15  ALKPHOS 55 46  BILITOT 0.9 1.2  PROT 7.2 5.8*  ALBUMIN 3.5 2.9*    No results for input(s): "LIPASE", "AMYLASE" in the last 168 hours. Recent Labs  Lab 10/22/22 0850  AMMONIA 28    Coagulation Profile: Recent Labs  Lab 10/21/22 2353  INR 1.2    Cardiac Enzymes: Recent Labs  Lab 10/22/22  0218  CKTOTAL 105    BNP (last 3 results) No results for input(s): "PROBNP" in the last 8760 hours. HbA1C: Recent Labs    10/22/22 0850  HGBA1C 5.9*    CBG: Recent Labs  Lab 10/24/22 1230 10/24/22 1554 10/24/22 2024 10/25/22 0013 10/25/22 0338  GLUCAP 185* 154* 188* 166* 178*    Lipid Profile: Recent Labs    10/23/22 0402  CHOL 97  HDL 39*  LDLCALC 50  TRIG 39  CHOLHDL 2.5    Thyroid Function Tests: Recent Labs    10/22/22 0850  TSH 1.301    Anemia Panel: No results for input(s): "VITAMINB12", "FOLATE", "FERRITIN", "TIBC", "IRON", "RETICCTPCT" in the last 72 hours. Sepsis Labs: Recent Labs  Lab 10/22/22 0305 10/22/22 0633  LATICACIDVEN 1.8 1.7     Recent Results (from the past 240 hour(s))  Urine Culture     Status: None   Collection Time: 10/22/22  2:10 AM   Specimen: Urine, Clean Catch  Result Value Ref Range Status   Specimen Description URINE, CLEAN CATCH  Final   Special Requests NONE  Final   Culture   Final    NO GROWTH Performed at [facility], [place] [address], [place], [place] [address]    Report Status 10/23/2022 FINAL  Final  Resp panel by RT-PCR (RSV, Flu A&B, Covid) Anterior Nasal Swab     Status: None   Collection Time: 10/22/22  2:19  AM   Specimen: Anterior Nasal Swab  Result Value Ref Range Status   SARS Coronavirus 2 by RT PCR NEGATIVE NEGATIVE Final    Comment: (NOTE) SARS-CoV-2 target nucleic acids are NOT DETECTED.  The SARS-CoV-2 RNA is generally detectable in upper respiratory specimens during the acute phase of infection. The lowest concentration of SARS-CoV-2 viral copies this assay can detect is 138 copies/mL. A negative result does not preclude SARS-Cov-2 infection and should not be used as the sole basis for treatment or other patient management decisions. A negative result may occur with  improper specimen collection/handling, submission of specimen other than nasopharyngeal swab, presence of viral mutation(s) within the areas targeted by this assay, and inadequate number of viral copies(<138 copies/mL). A negative result must be combined with clinical observations, patient history, and epidemiological information. The expected result is Negative.  Fact Sheet for Patients:  EntrepreneurPulse.com.au  Fact Sheet for Healthcare Providers:  IncredibleEmployment.be  This test is no t yet approved or cleared by the [place] FDA and  has been authorized for detection and/or diagnosis of SARS-CoV-2 by FDA under an Emergency Use Authorization (EUA). This EUA will remain  in effect (meaning this test can be used) for the duration of the COVID-19 declaration under Section 564(b)(1) of the Act, 21 U.S.C.section 360bbb-3(b)(1), unless the authorization is terminated  or revoked sooner.       Influenza A by PCR NEGATIVE NEGATIVE Final   Influenza B by PCR NEGATIVE NEGATIVE Final    Comment: (NOTE) The Xpert Xpress SARS-CoV-2/FLU/RSV plus assay is intended as an aid in the diagnosis of influenza from Nasopharyngeal swab specimens and should not be used as a sole basis for treatment. Nasal washings and aspirates are unacceptable for Xpert Xpress  SARS-CoV-2/FLU/RSV testing.  Fact Sheet for Patients: EntrepreneurPulse.com.au  Fact Sheet for Healthcare Providers: IncredibleEmployment.be  This test is not yet approved or cleared by the [place] FDA and has been authorized for detection and/or diagnosis of SARS-CoV-2 by FDA under an Emergency Use Authorization (EUA). This EUA will remain  in effect (meaning this test can be used) for the duration of the COVID-19 declaration under Section 564(b)(1) of the Act, 21 U.S.C. section 360bbb-3(b)(1), unless the authorization is terminated or revoked.     Resp Syncytial Virus by PCR NEGATIVE NEGATIVE Final    Comment: (NOTE) Fact Sheet for Patients: EntrepreneurPulse.com.au  Fact Sheet for Healthcare Providers: IncredibleEmployment.be  This test is not yet approved or cleared by the [place] FDA and has been authorized for detection and/or diagnosis of SARS-CoV-2 by FDA under an Emergency Use Authorization (EUA). This EUA will remain in effect (meaning this test can be used) for the duration of the COVID-19 declaration under Section 564(b)(1) of the Act, 21 U.S.C. section 360bbb-3(b)(1), unless the authorization is terminated or revoked.  Performed at [facility], [place] [address] [place], [place] [address]   Blood Culture (routine x 2)     Status: None (Preliminary result)   Collection Time: 10/22/22  3:00 AM   Specimen: BLOOD  Result Value Ref Range Status   Specimen Description BLOOD SITE NOT SPECIFIED  Final   Special Requests   Final    BOTTLES DRAWN AEROBIC AND ANAEROBIC Blood Culture results may not be optimal due to an inadequate volume of blood received in culture bottles   Culture   Final    NO GROWTH 2 DAYS Performed at [facility], [place] [address], [place], [place] [address]    Report Status PENDING  Incomplete  Blood Culture (routine x 2)     Status: None (Preliminary  result)   Collection Time: 10/22/22  6:33 AM   Specimen: BLOOD  Result Value Ref Range Status   Specimen Description BLOOD SITE NOT SPECIFIED  Final   Special Requests   Final    BOTTLES DRAWN AEROBIC AND ANAEROBIC Blood Culture adequate volume   Culture   Final    NO GROWTH 2 DAYS Performed at [facility], [address], [place], [place] [address]    Report Status PENDING  Incomplete    Radiology Studies: DG Abd Portable 1V  Result Date: 10/23/2022 CLINICAL DATA:  [id number] Encounter for feeding tube placement [id number] EXAM: PORTABLE ABDOMEN - 1 VIEW COMPARISON:  Chest from previous day FINDINGS: Weighted tip feeding tube has been advanced to the gastric antrum. Stomach is decompressed. Visualized bowel gas pattern normal. Lower abdomen excluded. Coarse interstitial and airspace opacities in the visualized lung bases as before. Spondylitic changes in the thoracolumbar spine. IMPRESSION: Feeding tube advanced to the gastric antrum. Electronically Signed   By: Lucrezia Europe M.D.   On: 10/23/2022 15:03    Scheduled Meds:  aspirin  325 mg Per Tube Daily   free water  50 mL Per Tube Q6H   heparin injection (subcutaneous)  5,000 Units Subcutaneous Q8H   QUEtiapine  25 mg Oral QHS   simvastatin  10 mg Per NG tube QPM   thiamine  100 mg Per Tube Daily   Continuous Infusions:  cefTRIAXone (ROCEPHIN)  IV 1 g (10/25/22 0200)   feeding supplement (OSMOLITE 1.2 CAL) 1,000 mL (10/24/22 1039)    LOS: 3 days   Time spent: 36mn  Mavis Fichera C Zyionna Pesce, DO [facility]  If 7PM-7AM, please contact night-coverage [email or web address]  10/25/2022, 7:56 AM

## 2022-10-25 NOTE — Progress Notes (Signed)
Notified provider of crackles in right lower lobe, orders received to stop continuous IV fluids.

## 2022-10-25 NOTE — Progress Notes (Addendum)
STROKE TEAM PROGRESS NOTE   INTERVAL HISTORY Patient evaluated at bedside in presence of family. Patient was alert, somewhat confused but oriented to self, persons, place, and situation. Continues to have L sided deficits but improved. Patient endorses headaches.  Vitals:   10/25/22 0011 10/25/22 0339 10/25/22 0800 10/25/22 1243  BP: 127/74 (!) 122/92 136/85 (!) 139/94  Pulse: 98 94 93 89  Resp: '19 20 20 20  '$ Temp: 97.9 F (36.6 C) 98.4 F (36.9 C) 99.5 F (37.5 C) 97.8 F (36.6 C)  TempSrc: Oral Oral Oral Oral  SpO2: 97% 97% 97%   Weight:      Height:       CBC:  Recent Labs  Lab 10/21/22 2353 10/22/22 0002 10/22/22 0856 10/23/22 0402  WBC 6.7  --   --  6.8  NEUTROABS 3.6  --   --   --   HGB 12.5*   < > 12.2* 11.4*  HCT 36.6*   < > 36.0* 32.6*  MCV 92.9  --   --  89.8  PLT 159  --   --  143*   < > = values in this interval not displayed.   Basic Metabolic Panel:  Recent Labs  Lab 10/23/22 0402 10/24/22 0330 10/25/22 0423  NA 127*  --  127*  K 3.3*  --  3.6  CL 96*  --  98  CO2 22  --  20*  GLUCOSE 88  --  145*  BUN 13  --  10  CREATININE 0.91  --  0.72  CALCIUM 8.2*  --  7.9*  MG  --  1.6* 2.0  PHOS  --  2.1* 2.4*   Lipid Panel:  Recent Labs  Lab 10/23/22 0402  CHOL 97  TRIG 39  HDL 39*  CHOLHDL 2.5  VLDL 8  LDLCALC 50   HgbA1c:  Recent Labs  Lab 10/22/22 0850  HGBA1C 5.9*   Urine Drug Screen: No results for input(s): "LABOPIA", "COCAINSCRNUR", "LABBENZ", "AMPHETMU", "THCU", "LABBARB" in the last 168 hours.  Alcohol Level No results for input(s): "ETH" in the last 168 hours.  IMAGING past 24 hours No results found.  PHYSICAL EXAM General: in no acute distress HEENT: normocephalic and atraumatic Cardiovascular: regular rate Respiratory: normal respiratory effort and on RA Gastrointestinal: non-tender and non-distended Extremities: moving all extremities spontaneously  Mental Status: Ryan Savage is alert; he is oriented to person,  oriented to place, and oriented to situation. He was unable to follow any commands.  Cranial Nerves: III,IV, VI: no ptosis, extra-ocular motions intact bilaterally V,VII: flattening of L nasolabial fold, smile asymmetric with notable droop on L side, facial light touch sensation intact bilaterally XII: midline tongue extension without atrophy and without fasciculations  Motor: Right : Upper extremity   5/5 full power  Lower extremity   5/5 full power  Left: Upper extremity   4/5 full range of motion against gravity and offers some resistance Lower extremity   5/5 full power  Tone and bulk: normal tone throughout; no atrophy noted  Sensory: intact in bilateral UE, LE  Gait: not observed during encounter  ASSESSMENT/PLAN [name] is a 88 year old with a past medical history that includes atrial fibrillation not on anticoagulation due to bilateral subdural bleeds and recurrent falls that required evacuation and also refused Watchman device, coronary artery disease, hyperlipidemia, hypertension, sleep apnea, thrombocytopenia, prior strokes with residual right homonymous hemianopsia as well as residual word finding difficulty brought in for emergent evaluation of left-sided weakness  and strokelike symptoms. MRI showed multiple acute cortically-based infarcts within the right MCA territory and right MCA/PCA watershed territory.  Stroke - scattered acute R MCA and MCA/PCA watershed infarcts, embolic due to afib not on [facility]  Code Stroke CT head shows no acute hemorrhage. 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 ASPECTS 10.   CTA head & neck shows No emergent large vessel occlusion or high-grade stenosis of the intracranial arteries. MRI  showed multiple acute cortically-based infarcts within the right MCA territory and right MCA/PCA watershed territory. Chronic hemorrhage within the right cerebellar hemisphere, unchanged. Chronic blood  products also present at site of the chronic left PCA territory infarct. 2D Echo EF 55-60% EEG mild diffuse slowing, no seizure LDL 50 HgbA1c 5.9 VTE prophylaxis - heparin subcu No antithrombotic prior to admission, now on aspirin 325 mg per tube. Pt now has lower risk of fall, will consider starting AC once pass swallow or PEG done Therapy recommendations: SNF Disposition:  pending medical clearance  Chronic A-fib On aspirin at home Not on [facility] due to history of frequent falls and SDH s/p b/l hematoma evacuation Has refused Watchman device in the past with Dr. [name] cardiology Now on aspirin 325 Discussed with wife and son, now pt risk of fall lowered, will consider starting [facility] for stroke prevention once able to swallow or has PEG done.  History of strokes 02/2021 left MCA punctate infarct on MRI.  MRA left ICA cervical-petrous moderate stenosis.  Carotid Doppler negative.  EF 45 to 50%, LDL 54, A1c 5.6.  Etiology likely due to A. fib not on [facility], however given patient history of ICH and SAH, discharged on aspirin 81 and Zocor 10. 07/2021 admitted for left large PCA infarct.  CTA head and neck no LVO, no left PCA occlusion.  EF 60 to 65%.  LDL 45 and A1c 5.7.  Continue on aspirin 81 and recommend follow-up with Dr. [name] for Watchman device placement.  However, patient declined Watchman device later.  Hypertension, currently normotensive Home meds:  amlodipine, held at this time Stable Long-term BP goal normotensive  Hyperlipidemia, chronic Home meds:  simvastatin 10 mg, continued on admission LDL 50, goal < 70 High intensity statin not indicated as LDL at goal Continue statin at discharge  Other Stroke Risk Factors Advanced Age >/= 68  [name] Obstructive sleep apnea on CPAP  Other Active Problems Hyponatremia sodium 126->127->127 Delirium with sleep-wake cycle disturbance -added Seroquel nightly   Hospital day # 3   ATTENDING NOTE: I reviewed above note and agree with the  assessment and plan. Pt was seen and examined.   Son and wife and RN are at the bedside. Pt was lying in bed, initially in sleep but arousable and able to answer orientation questions. Orientated to place, people and age but not to time, severe dysarthria. Moving b/l extremities symmetrically. Did not pass swallow, has cortrak and on TF. Would like to give more days to see if he is able to pass swallow, otherwise, will need PEG for SNF placement. Once pass swallow or PEG done, will consider DOAC for stroke prevention. In the meantime, continue ASA. Put on seroquel and reported slept well last night, will continue.   For detailed assessment and plan, please refer to above/below as I have made changes wherever appropriate.   Neurology will sign off. Please call with questions. Pt will follow up with stroke clinic NP Frann Rider at [facility] in about 4 weeks. Thanks for  the consult.   Rosalin Hawking, MD PhD Stroke Neurology 10/25/2022 8:16 PM   To contact Stroke Continuity provider, please refer to [email or web address] After hours, contact General Neurology

## 2022-10-25 NOTE — Progress Notes (Signed)
Physical Therapy Treatment Patient Details Name: Ryan Savage MRN: [id number] DOB: [birth year] Today's Date: 10/25/2022   History of Present Illness Ryan Savage is a 88 year old with a past medical history that includes atrial fibrillation not on anticoagulation due to bilateral subdural bleeds and recurrent falls that required evacuation, coronary artery disease, hyperlipidemia, hypertension, sleep apnea, thrombocytopenia, prior strokes with residual right homonymous hemianopsia as well as residual word finding difficulty brought in for emergent evaluation of left-sided weakness and strokelike symptoms. MRI showed multiple acute cortically-based infarcts within the right MCA territory and right MCA/PCA watershed territory    PT Comments    Received pt semi-reclined in bed with RN performing oral care. Pt lethargic and with eyes closed frequently during session, but overall able to perform much better today per son. Pt required mod A for bed mobility and performed transfers with RW and min A. Pt progressed with ambulation in hallway with RW and min A +2 for equipment management. Returned to recliner and pt applied mitts. Pt left with seatbelt alarm on and family present at bedside. If pt continues to improve, may be appropriate for HHPT vs SNF. Acute PT to cont to follow.      Recommendations for follow up therapy are one component of a multi-disciplinary discharge planning process, led by the attending physician.  Recommendations may be updated based on patient status, additional functional criteria and insurance authorization.  Follow Up Recommendations  Skilled nursing-short term rehab (<3 hours/day) Can patient physically be transported by private vehicle: Yes   Assistance Recommended at Discharge Frequent or constant Supervision/Assistance  Patient can return home with the following Help with stairs or ramp for entrance;Assistance with cooking/housework;A little help with walking and/or  transfers;A little help with bathing/dressing/bathroom;Assist for transportation   Equipment Recommendations  Rolling walker (2 wheels)    Recommendations for Other Services       Precautions / Restrictions Precautions Precautions: Fall;Other (comment) Precaution Comments: seizure and aspiration, mitts Restrictions Weight Bearing Restrictions: No     Mobility  Bed Mobility Overal bed mobility: Needs Assistance Bed Mobility: Rolling, Supine to Sit Rolling: Min assist   Supine to sit: Mod assist     General bed mobility comments: Pt was much more awake/alert today and able to progress with functional mobility Patient Response: Cooperative  Transfers Overall transfer level: Needs assistance Equipment used: Rolling walker (2 wheels) Transfers: Sit to/from Stand Sit to Stand: Min assist           General transfer comment: stood from EOB with RW and light min A    Ambulation/Gait Ambulation/Gait assistance: Min assist, +2 safety/equipment Gait Distance (Feet): 66 Feet Assistive device: Rolling walker (2 wheels) Gait Pattern/deviations: Trunk flexed, Narrow base of support, Shuffle, Decreased stride length, Decreased step length - right, Decreased step length - left Gait velocity: decreased Gait velocity interpretation: <1.31 ft/sec, indicative of household ambulator   General Gait Details: required increased cues and assist to steer RW when turning   Stairs             Wheelchair Mobility    Modified Rankin (Stroke Patients Only)       Balance Overall balance assessment: Needs assistance Sitting-balance support: Bilateral upper extremity supported Sitting balance-Leahy Scale: Fair Sitting balance - Comments: able to maintain static and dynamic sitting balance with close supervision   Standing balance support: Bilateral upper extremity supported, During functional activity (RW) Standing balance-Leahy Scale: Poor Standing balance comment: required  min guard for static standing balance and min A  for dynamic standing balance                            Cognition Arousal/Alertness: Lethargic Behavior During Therapy: WFL for tasks assessed/performed, Impulsive (mild impulsivity) Overall Cognitive Status: History of cognitive impairments - at baseline                                 General Comments: Pt became more awake/alert once sitting EOB        Exercises      General Comments General comments (skin integrity, edema, etc.): son and wife present at bedside, son providing +2 assist      Pertinent Vitals/Pain Pain Assessment Pain Assessment: No/denies pain    Home Living                          Prior Function            PT Goals (current goals can now be found in the care plan section) Acute Rehab PT Goals Patient Stated Goal: To rehab to return home PT Goal Formulation: With family Time For Goal Achievement: 11/06/22 Potential to Achieve Goals: Good Progress towards PT goals: Progressing toward goals    Frequency    Min 3X/week      PT Plan Current plan remains appropriate    Co-evaluation              AM-PAC PT "6 Clicks" Mobility   Outcome Measure  Help needed turning from your back to your side while in a flat bed without using bedrails?: A Little Help needed moving from lying on your back to sitting on the side of a flat bed without using bedrails?: A Lot Help needed moving to and from a bed to a chair (including a wheelchair)?: A Little Help needed standing up from a chair using your arms (e.g., wheelchair or bedside chair)?: A Little Help needed to walk in hospital room?: A Lot Help needed climbing 3-5 steps with a railing? : A Lot 6 Click Score: 15    End of Session Equipment Utilized During Treatment: Gait belt Activity Tolerance: Patient tolerated treatment well Patient left: in chair;with call bell/phone within reach;with chair alarm set;with  family/visitor present;Other (comment) (mitts on) Nurse Communication: Mobility status PT Visit Diagnosis: Other abnormalities of gait and mobility (R26.89);Muscle weakness (generalized) (M62.81)     Time: 5697-9480 PT Time Calculation (min) (ACUTE ONLY): 38 min  Charges:  $Gait Training: 8-22 mins $Therapeutic Activity: 23-37 mins                     Becky Sax PT, DPT  Blenda Nicely 10/25/2022, 12:44 PM

## 2022-10-26 ENCOUNTER — Inpatient Hospital Stay (HOSPITAL_COMMUNITY): Payer: Medicare PPO

## 2022-10-26 DIAGNOSIS — I63411 Cerebral infarction due to embolism of right middle cerebral artery: Secondary | ICD-10-CM | POA: Diagnosis not present

## 2022-10-26 DIAGNOSIS — I482 Chronic atrial fibrillation, unspecified: Secondary | ICD-10-CM | POA: Diagnosis not present

## 2022-10-26 LAB — GLUCOSE, CAPILLARY: Glucose-Capillary: 159 mg/dL — ABNORMAL HIGH (ref 70–99)

## 2022-10-26 LAB — BASIC METABOLIC PANEL
Anion gap: 9 (ref 5–15)
BUN: 13 mg/dL (ref 8–23)
CO2: 22 mmol/L (ref 22–32)
Calcium: 8.4 mg/dL — ABNORMAL LOW (ref 8.9–10.3)
Chloride: 99 mmol/L (ref 98–111)
Creatinine, Ser: 0.72 mg/dL (ref 0.61–1.24)
GFR, Estimated: >60 mL/min (ref >60)
Glucose, Bld: 144 mg/dL — ABNORMAL HIGH (ref 70–99)
Potassium: 3.7 mmol/L (ref 3.5–5.1)
Sodium: 130 mmol/L — ABNORMAL LOW (ref 135–145)

## 2022-10-26 LAB — PHOSPHORUS: Phosphorus: 2.4 mg/dL — ABNORMAL LOW (ref 2.5–4.6)

## 2022-10-26 LAB — MAGNESIUM: Magnesium: 1.8 mg/dL (ref 1.7–2.4)

## 2022-10-26 MED ORDER — POLYETHYLENE GLYCOL 3350 17 G PO PACK
17.0000 g | PACK | Freq: Two times a day (BID) | ORAL | Status: DC
  Administered 2022-10-26 – 2022-10-28 (×5): 17 g via ORAL
  Filled 2022-10-26 (×6): qty 1

## 2022-10-26 MED ORDER — METOPROLOL TARTRATE 25 MG PO TABS
25.0000 mg | ORAL_TABLET | Freq: Two times a day (BID) | ORAL | Status: DC
  Administered 2022-10-27 – 2022-10-31 (×9): 25 mg via NASOGASTRIC
  Filled 2022-10-26 (×10): qty 1

## 2022-10-26 MED ORDER — MORPHINE SULFATE (PF) 2 MG/ML IV SOLN
1.0000 mg | INTRAVENOUS | Status: DC | PRN
  Administered 2022-10-26 – 2022-10-27 (×2): 1 mg via INTRAVENOUS
  Filled 2022-10-26 (×2): qty 1

## 2022-10-26 MED ORDER — SODIUM CHLORIDE 0.9 % IV SOLN: INTRAVENOUS | Status: AC

## 2022-10-26 NOTE — Progress Notes (Signed)
**Note Ryan-Identified via Obfuscation** PROGRESS NOTE    Ryan Savage  LNL:[id number] DOB: 10-Apr-1929 DOA: 10/21/2022 PCP: Lajean Manes, MD   Brief Narrative:  Ryan Savage is a 88 y.o. male with medical history significant of atrial fibrillation not on [place] due to prior hx of bilateral subdural bleeds as well as recurrent falls, of note patient offered watchman previously but refused. Patient also has history significant for coronary artery disease, hyperlipidemia, hypertension, obstructive sleep apnea, thrombocytopenia, CVA with residual visual deficits as well  word finding difficulties who per wife first noted around 10pm evening of presentation was found to be altered/confused - unable to communicate which is not his baseline.  Assessment & Plan:   Principal Problem:   Cerebral embolism with cerebral infarction Active Problems:   Atrial fibrillation, chronic (HCC)   Elevated troponin   Acute metabolic encephalopathy  Goals of care  -Lengthy discussion with patient's wife and son at bedside, patient has a living well that indicates he would not want to be artificially kept alive via permanent feeding tube; given this new information we will continue NG tube over the weekend into next week and to continue aggressive speech therapy in hopes that patient can recover safe p.o. intake. -If by next week patient is not making any progress and will not be able to tolerate p.o. safely family is in agreement to discuss moving forward with palliative care and hospice at that time.  Acute metabolic encephalopathy, ongoing Secondary to multiple acute CVAs Rule out concurrent hospital delirium -MRI confirms multiple CVA of the R MCA/PCA - largest is 4.7cm -Neuro following -likely embolic in origin given A-fib unable to tolerate anticoagulation due to previous subdural hematoma -Continue aspirin for now per neurology -Patient's mental status continues to wax and wane, minimally improving with Seroquel, no issues overnight  Acute  profound dysphagia Refeeding syndrome Hypovolemic hyponatremia -Secondary to above, speech continues to follow, apparently more oriented and interactive today hopeful patient will be able to recover some of his oral intake -Core track placed 10/23/2022 -Continue to follow potassium and Phos and mag, replace as appropriate -Patient's living well indicates he would not want a PEG tube, see above plan to continue to advance diet as tolerated with NG tube removal next week; if unable to tolerate p.o. safely will transition to palliative care and hospice.   History of subdural hematoma status postevacuation bilaterally -Patient has not been on anticoagulation despite his known history of A-fib due to high risk of bleeding   Questionable CAP -Completed antibiotics -Patient's transiently hypoxic at intake now resolved back on room air - oxygen occasionally placed overnight likely secondary to OSA as below   Atrial fib, not on anticoagulation, rate controlled -Will likely transition to [place] after patient's oral intake improves or until after PEG is placed, asa for now per neuro -Remains in A-fib, rate controlled -Continue rate control with beta-blocker -Previously declined Watchman device, as above lengthy discussion with family will need to be had in regards to anticoagulation given patient remains high risk whether he is on anticoagulation or not given his history and above   Hypertension  -Permissive hypertension, resolving, resume medications as indicated -Blood pressure currently well-controlled off all prior meds   Severe 2 vessel [name] -Continue beta-blocker, statin, on aspirin given above   CHF, reduced ejection fraction -Remains euvolemic, hold diuretics, increase intake via NG tube and tube feeds with free water flushes   Pulmonary hypertension, sleep apnea -Not currently on CPAP, will continue to follow clinically   Hyperlipidemia -continue statin  Aortic root ascending Aortic  dilation -continue routine surveillance    DVT prophylaxis: SCDs only Code Status: full  Family Communication: wife and son at bedside  Status is: Inpatient  Dispo: The patient is from: Home              Anticipated d/c is to: SNF              Anticipated d/c date is: 72+ hours              Patient currently not medically stable for discharge, continues to have profound mental status changes, currently requiring NG tube for nutrition, medications, and free water.  Consultants:  Neuro  Procedures:  None  Antimicrobials:  Azithromycin, ceftriaxone x 5 days  Subjective: No acute issues or events overnight, sleeping well overnight per son, mental status appears to be resolving but speech remains quite unintelligible.  Continue to advance diet as tolerated lengthy discussion today as above about patient's prognosis and goals of care.  Objective: Vitals:   10/25/22 2045 10/26/22 0015 10/26/22 0500 10/26/22 0528  BP: (!) 140/87 137/75  (!) 151/75  Pulse: 76 86  82  Resp: '16 17  17  '$ Temp: 98.8 F (37.1 C) 97.7 F (36.5 C)  97.7 F (36.5 C)  TempSrc: Oral Axillary  Axillary  SpO2: 98% 99%  99%  Weight:   80.3 kg   Height:        Intake/Output Summary (Last 24 hours) at 10/26/2022 0742 Last data filed at 10/26/2022 0524 Gross per 24 hour  Intake 2008 ml  Output 1850 ml  Net 158 ml    Filed Weights   10/23/22 0421 10/24/22 0500 10/26/22 0500  Weight: 83.1 kg 81.5 kg 80.3 kg    Examination:  General:  Pleasantly resting in bed, No acute distress.  Patient's speech minimally improving but remains quite dysarthric HEENT:  Normocephalic atraumatic.  NG tube intact Neck:  Without mass or deformity.  Trachea is midline. Lungs:  Clear to auscultate bilaterally without rhonchi, wheeze, or rales. Heart:  Regular rate and rhythm.  Without murmurs, rubs, or gallops. Abdomen:  Soft, nontender, nondistended.  Without guarding or rebound. Extremities: Without cyanosis, clubbing,  edema, or obvious deformity.  Data Reviewed: I have personally reviewed following labs and imaging studies  CBC: Recent Labs  Lab 10/21/22 2353 10/22/22 0002 10/22/22 0856 10/23/22 0402  WBC 6.7  --   --  6.8  NEUTROABS 3.6  --   --   --   HGB 12.5* 13.3 12.2* 11.4*  HCT 36.6* 39.0 36.0* 32.6*  MCV 92.9  --   --  89.8  PLT 159  --   --  143*    Basic Metabolic Panel: Recent Labs  Lab 10/21/22 2353 10/22/22 0002 10/22/22 0856 10/23/22 0402 10/24/22 0330 10/25/22 0423 10/26/22 0320  NA 124* 126* 127* 127*  --  127* 130*  K 4.0 4.2 3.7 3.3*  --  3.6 3.7  CL 89* 90*  --  96*  --  98 99  CO2 26  --   --  22  --  20* 22  GLUCOSE 107* 100*  --  88  --  145* 144*  BUN 21 25*  --  13  --  10 13  CREATININE 1.19 1.20  --  0.91  --  0.72 0.72  CALCIUM 8.9  --   --  8.2*  --  7.9* 8.4*  MG  --   --   --   --  1.6* 2.0 1.8  PHOS  --   --   --   --  2.1* 2.4* 2.4*    GFR: Estimated Creatinine Clearance: 60.1 mL/min (by C-G formula based on SCr of 0.72 mg/dL). Liver Function Tests: Recent Labs  Lab 10/21/22 2353 10/23/22 0402  AST 27 41  ALT 15 15  ALKPHOS 55 46  BILITOT 0.9 1.2  PROT 7.2 5.8*  ALBUMIN 3.5 2.9*    No results for input(s): "LIPASE", "AMYLASE" in the last 168 hours. Recent Labs  Lab 10/22/22 0850  AMMONIA 28    Coagulation Profile: Recent Labs  Lab 10/21/22 2353  INR 1.2    Cardiac Enzymes: Recent Labs  Lab 10/22/22 0218  CKTOTAL 105    BNP (last 3 results) No results for input(s): "PROBNP" in the last 8760 hours. HbA1C: No results for input(s): "HGBA1C" in the last 72 hours.  CBG: Recent Labs  Lab 10/25/22 0338 10/25/22 0802 10/25/22 1239 10/25/22 1557 10/25/22 2047  GLUCAP 178* 165* 154* 182* 141*    Lipid Profile: No results for input(s): "CHOL", "HDL", "LDLCALC", "TRIG", "CHOLHDL", "LDLDIRECT" in the last 72 hours.  Thyroid Function Tests: No results for input(s): "TSH", "T4TOTAL", "FREET4", "T3FREE", "THYROIDAB" in  the last 72 hours.  Anemia Panel: No results for input(s): "VITAMINB12", "FOLATE", "FERRITIN", "TIBC", "IRON", "RETICCTPCT" in the last 72 hours. Sepsis Labs: Recent Labs  Lab 10/22/22 0305 10/22/22 0633  LATICACIDVEN 1.8 1.7     Recent Results (from the past 240 hour(s))  Urine Culture     Status: None   Collection Time: 10/22/22  2:10 AM   Specimen: Urine, Clean Catch  Result Value Ref Range Status   Specimen Description URINE, CLEAN CATCH  Final   Special Requests NONE  Final   Culture   Final    NO GROWTH Performed at [facility], [place] [address] [place], [place] [address]    Report Status 10/23/2022 FINAL  Final  Resp panel by RT-PCR (RSV, Flu A&B, Covid) Anterior Nasal Swab     Status: None   Collection Time: 10/22/22  2:19 AM   Specimen: Anterior Nasal Swab  Result Value Ref Range Status   SARS Coronavirus 2 by RT PCR NEGATIVE NEGATIVE Final    Comment: (NOTE) SARS-CoV-2 target nucleic acids are NOT DETECTED.  The SARS-CoV-2 RNA is generally detectable in upper respiratory specimens during the acute phase of infection. The lowest concentration of SARS-CoV-2 viral copies this assay can detect is 138 copies/mL. A negative result does not preclude SARS-Cov-2 infection and should not be used as the sole basis for treatment or other patient management decisions. A negative result may occur with  improper specimen collection/handling, submission of specimen other than nasopharyngeal swab, presence of viral mutation(s) within the areas targeted by this assay, and inadequate number of viral copies(<138 copies/mL). A negative result must be combined with clinical observations, patient history, and epidemiological information. The expected result is Negative.  Fact Sheet for Patients:  EntrepreneurPulse.com.au  Fact Sheet for Healthcare Providers:  IncredibleEmployment.be  This test is no t yet approved or cleared by the  [place] FDA and  has been authorized for detection and/or diagnosis of SARS-CoV-2 by FDA under an Emergency Use Authorization (EUA). This EUA will remain  in effect (meaning this test can be used) for the duration of the COVID-19 declaration under Section 564(b)(1) of the Act, 21 U.S.C.section 360bbb-3(b)(1), unless the authorization is terminated  or revoked sooner.       Influenza  A by PCR NEGATIVE NEGATIVE Final   Influenza B by PCR NEGATIVE NEGATIVE Final    Comment: (NOTE) The Xpert Xpress SARS-CoV-2/FLU/RSV plus assay is intended as an aid in the diagnosis of influenza from Nasopharyngeal swab specimens and should not be used as a sole basis for treatment. Nasal washings and aspirates are unacceptable for Xpert Xpress SARS-CoV-2/FLU/RSV testing.  Fact Sheet for Patients: EntrepreneurPulse.com.au  Fact Sheet for Healthcare Providers: IncredibleEmployment.be  This test is not yet approved or cleared by the [place] FDA and has been authorized for detection and/or diagnosis of SARS-CoV-2 by FDA under an Emergency Use Authorization (EUA). This EUA will remain in effect (meaning this test can be used) for the duration of the COVID-19 declaration under Section 564(b)(1) of the Act, 21 U.S.C. section 360bbb-3(b)(1), unless the authorization is terminated or revoked.     Resp Syncytial Virus by PCR NEGATIVE NEGATIVE Final    Comment: (NOTE) Fact Sheet for Patients: EntrepreneurPulse.com.au  Fact Sheet for Healthcare Providers: IncredibleEmployment.be  This test is not yet approved or cleared by the [place] FDA and has been authorized for detection and/or diagnosis of SARS-CoV-2 by FDA under an Emergency Use Authorization (EUA). This EUA will remain in effect (meaning this test can be used) for the duration of the COVID-19 declaration under Section 564(b)(1) of the Act, 21 U.S.C. section  360bbb-3(b)(1), unless the authorization is terminated or revoked.  Performed at [facility], [place] [address], [place], [place] [address]   Blood Culture (routine x 2)     Status: None (Preliminary result)   Collection Time: 10/22/22  3:00 AM   Specimen: BLOOD  Result Value Ref Range Status   Specimen Description BLOOD SITE NOT SPECIFIED  Final   Special Requests   Final    BOTTLES DRAWN AEROBIC AND ANAEROBIC Blood Culture results may not be optimal due to an inadequate volume of blood received in culture bottles   Culture   Final    NO GROWTH 3 DAYS Performed at [facility], [place] [address], [place], [place] [address]    Report Status PENDING  Incomplete  Blood Culture (routine x 2)     Status: None (Preliminary result)   Collection Time: 10/22/22  6:33 AM   Specimen: BLOOD  Result Value Ref Range Status   Specimen Description BLOOD SITE NOT SPECIFIED  Final   Special Requests   Final    BOTTLES DRAWN AEROBIC AND ANAEROBIC Blood Culture adequate volume   Culture   Final    NO GROWTH 3 DAYS Performed at [facility], [address] [place], [place] [address]    Report Status PENDING  Incomplete    Radiology Studies: No results found.  Scheduled Meds:  aspirin  325 mg Per Tube Daily   free water  50 mL Per Tube Q6H   heparin injection (subcutaneous)  5,000 Units Subcutaneous Q8H   insulin aspart  0-6 Units Subcutaneous Q4H   QUEtiapine  25 mg Oral QHS   simvastatin  10 mg Per NG tube QPM   thiamine  100 mg Per Tube Daily   Continuous Infusions:  cefTRIAXone (ROCEPHIN)  IV 200 mL/hr at 10/26/22 0524   feeding supplement (OSMOLITE 1.2 CAL) 60 mL/hr at 10/26/22 0524    LOS: 4 days   Time spent: 52mn  Ewel Lona C Aayushi Solorzano, DO [facility]  If 7PM-7AM, please contact night-coverage [email or web address]  10/26/2022, 7:42 AM

## 2022-10-26 NOTE — Progress Notes (Signed)
Called by nursing, patient heart rate increased into the 120 -140s.  Patient becomes a bit more tachypneic, right wrist appears warm and somewhat painful. Patient seen, in atrial fibrillation with frequent PVCs.  Right wrist minimally warm minimally tender.  Difficult exam as patient not very communicative.  BMP, mag level ordered.  Chest x-ray to evaluate for aspiration.  X-ray right wrist ordered. Son at bedside.  Updated

## 2022-10-27 DIAGNOSIS — I63411 Cerebral infarction due to embolism of right middle cerebral artery: Secondary | ICD-10-CM | POA: Diagnosis not present

## 2022-10-27 DIAGNOSIS — I482 Chronic atrial fibrillation, unspecified: Secondary | ICD-10-CM | POA: Diagnosis not present

## 2022-10-27 LAB — CULTURE, BLOOD (ROUTINE X 2)
Culture: NO GROWTH
Culture: NO GROWTH
Special Requests: ADEQUATE

## 2022-10-27 LAB — GLUCOSE, CAPILLARY
Glucose-Capillary: 115 mg/dL — ABNORMAL HIGH (ref 70–99)
Glucose-Capillary: 124 mg/dL — ABNORMAL HIGH (ref 70–99)
Glucose-Capillary: 124 mg/dL — ABNORMAL HIGH (ref 70–99)
Glucose-Capillary: 144 mg/dL — ABNORMAL HIGH (ref 70–99)
Glucose-Capillary: 151 mg/dL — ABNORMAL HIGH (ref 70–99)
Glucose-Capillary: 154 mg/dL — ABNORMAL HIGH (ref 70–99)
Glucose-Capillary: 161 mg/dL — ABNORMAL HIGH (ref 70–99)

## 2022-10-27 LAB — BASIC METABOLIC PANEL
Anion gap: 10 (ref 5–15)
Anion gap: 8 (ref 5–15)
BUN: 18 mg/dL (ref 8–23)
BUN: 18 mg/dL (ref 8–23)
CO2: 21 mmol/L — ABNORMAL LOW (ref 22–32)
CO2: 24 mmol/L (ref 22–32)
Calcium: 8.4 mg/dL — ABNORMAL LOW (ref 8.9–10.3)
Calcium: 8.6 mg/dL — ABNORMAL LOW (ref 8.9–10.3)
Chloride: 100 mmol/L (ref 98–111)
Chloride: 98 mmol/L (ref 98–111)
Creatinine, Ser: 0.71 mg/dL (ref 0.61–1.24)
Creatinine, Ser: 0.71 mg/dL (ref 0.61–1.24)
GFR, Estimated: >60 mL/min (ref >60)
GFR, Estimated: >60 mL/min (ref >60)
Glucose, Bld: 133 mg/dL — ABNORMAL HIGH (ref 70–99)
Glucose, Bld: 143 mg/dL — ABNORMAL HIGH (ref 70–99)
Potassium: 4.1 mmol/L (ref 3.5–5.1)
Potassium: 4.1 mmol/L (ref 3.5–5.1)
Sodium: 129 mmol/L — ABNORMAL LOW (ref 135–145)
Sodium: 132 mmol/L — ABNORMAL LOW (ref 135–145)

## 2022-10-27 LAB — MAGNESIUM
Magnesium: 1.8 mg/dL (ref 1.7–2.4)
Magnesium: 1.8 mg/dL (ref 1.7–2.4)

## 2022-10-27 LAB — PHOSPHORUS: Phosphorus: 3.3 mg/dL (ref 2.5–4.6)

## 2022-10-27 NOTE — Progress Notes (Signed)
PROGRESS NOTE    Ryan Savage  YKD:[id number] DOB: Mar 07, 1929 DOA: 10/21/2022 PCP: Lajean Manes, MD   Brief Narrative:  Ryan Savage is a 88 y.o. male with medical history significant of atrial fibrillation not on [place] due to prior hx of bilateral subdural bleeds as well as recurrent falls, of note patient offered watchman previously but refused. Patient also has history significant for coronary artery disease, hyperlipidemia, hypertension, obstructive sleep apnea, thrombocytopenia, CVA with residual visual deficits as well  word finding difficulties who per wife first noted around 10pm evening of presentation was found to be altered/confused - unable to communicate which is not his baseline.  Assessment & Plan:   Principal Problem:   Cerebral embolism with cerebral infarction Active Problems:   Atrial fibrillation, chronic (HCC)   Elevated troponin   Acute metabolic encephalopathy  Goals of care  -Lengthy discussion with patient's wife and son at bedside, patient has a living well that indicates he would not want to be artificially kept alive via permanent feeding tube; given this new information we will continue NG tube over the weekend into next week and to continue aggressive speech therapy in hopes that patient can recover safe p.o. intake. -If by the end of the week patient is not making any progress and will not be able to tolerate p.o. safely family is in agreement to discuss moving forward with palliative care and hospice at that time.  Acute metabolic encephalopathy, resolving Secondary to multiple acute CVAs Waxing and waning hospital delirium -MRI confirms multiple CVA of the R MCA/PCA - largest is 4.7cm -Neuro following -likely embolic in origin given A-fib unable to tolerate anticoagulation due to previous subdural hematoma -Continue aspirin for now per neurology -Patient's mental status continues to wax and wane, minimally improving with Seroquel, patient sleeping  poorly again overnight, will consider increasing Seroquel dose if this becomes an ongoing issue.  Acute profound dysphagia Refeeding syndrome Hypovolemic hyponatremia -Secondary to above, speech continues to follow, apparently more oriented and interactive today hopeful patient will be able to recover some of his oral intake -Core track placed 10/23/2022 -Continue to follow potassium and Phos and mag, replace as appropriate -Patient's living well indicates he would not want a PEG tube, see above plan to continue to advance diet as tolerated with NG tube removal next week; if unable to tolerate p.o. safely will transition to palliative care and hospice.   History of subdural hematoma status postevacuation bilaterally -Patient has not been on anticoagulation despite his known history of A-fib due to high risk of bleeding   Questionable CAP -Completed antibiotics -Patient's transiently hypoxic at intake now resolved back on room air - oxygen occasionally placed overnight likely secondary to OSA as below -Repeat chest x-ray overnight without acute findings, questionable resolving effusion/right upper lobe opacifications improved from prior imaging    Atrial fib, not on anticoagulation, rate controlled -Will likely transition to DOAC after patient's oral intake improves or until after PEG is placed, asa for now per neuro -Remains in A-fib, rate controlled -Continue rate control with beta-blocker -Previously declined Watchman device, as above lengthy discussion with family will need to be had in regards to anticoagulation given patient remains high risk whether he is on anticoagulation or not given his history and above   Hypertension  -Permissive hypertension, resolving, resume medications as indicated -Blood pressure currently well-controlled off all prior meds   Severe 2 vessel [name] -Continue beta-blocker, statin, on aspirin given above   CHF, reduced ejection fraction -Remains euvolemic,  hold diuretics, increase intake via NG tube and tube feeds with free water flushes - strict I/Os   Pulmonary hypertension, sleep apnea -Not currently on CPAP, will continue to follow clinically   Hyperlipidemia -continue statin    Aortic root ascending Aortic dilation -continue routine surveillance    DVT prophylaxis: SCDs only Code Status: full  Family Communication: wife and stepdaughter at bedside  Status is: Inpatient  Dispo: The patient is from: Home              Anticipated d/c is to: SNF              Anticipated d/c date is: 72+ hours              Patient currently not medically stable for discharge, continues to have ongoing mental status changes, currently requiring NG tube for nutrition, medications, and free water.  Consultants:  Neuro  Procedures:  None  Antimicrobials:  Azithromycin, ceftriaxone completed  Subjective: Overnight transient episode of tachycardia, chest x-ray shows improving airspace disease.  Right wrist x-ray was unremarkable for acute findings.  Objective: Vitals:   10/26/22 1611 10/26/22 2200 10/27/22 0018 10/27/22 0320  BP: (!) 150/93 (!) 137/101 (!) 135/99 134/81  Pulse: (!) 109     Resp: 18 (!) 26 (!) 22 20  Temp: 98.5 F (36.9 C) 99.3 F (37.4 C) 98.8 F (37.1 C) 97.8 F (36.6 C)  TempSrc: Axillary Axillary Axillary Oral  SpO2: 95% 95% 98% 96%  Weight:      Height:        Intake/Output Summary (Last 24 hours) at 10/27/2022 0757 Last data filed at 10/27/2022 0023 Gross per 24 hour  Intake 251.5 ml  Output 300 ml  Net -48.5 ml    Filed Weights   10/23/22 0421 10/24/22 0500 10/26/22 0500  Weight: 83.1 kg 81.5 kg 80.3 kg    Examination:  General:  Pleasantly resting in bed, No acute distress.  Patient's speech minimally improving but remains quite dysarthric HEENT:  Normocephalic atraumatic.  NG tube intact Neck:  Without mass or deformity.  Trachea is midline. Lungs:  Clear to auscultate bilaterally without rhonchi,  wheeze, or rales. Heart:  Regular rate and rhythm.  Without murmurs, rubs, or gallops. Abdomen:  Soft, nontender, nondistended.  Without guarding or rebound. Extremities: Without cyanosis, clubbing, edema, or obvious deformity.  Data Reviewed: I have personally reviewed following labs and imaging studies  CBC: Recent Labs  Lab 10/21/22 2353 10/22/22 0002 10/22/22 0856 10/23/22 0402  WBC 6.7  --   --  6.8  NEUTROABS 3.6  --   --   --   HGB 12.5* 13.3 12.2* 11.4*  HCT 36.6* 39.0 36.0* 32.6*  MCV 92.9  --   --  89.8  PLT 159  --   --  143*    Basic Metabolic Panel: Recent Labs  Lab 10/23/22 0402 10/24/22 0330 10/25/22 0423 10/26/22 0320 10/26/22 2345 10/27/22 0357  NA 127*  --  127* 130* 129* 132*  K 3.3*  --  3.6 3.7 4.1 4.1  CL 96*  --  98 99 98 100  CO2 22  --  20* 22 21* 24  GLUCOSE 88  --  145* 144* 143* 133*  BUN 13  --  '10 13 18 18  '$ CREATININE 0.91  --  0.72 0.72 0.71 0.71  CALCIUM 8.2*  --  7.9* 8.4* 8.4* 8.6*  MG  --  1.6* 2.0 1.8 1.8 1.8  PHOS  --  2.1* 2.4* 2.4*  --  3.3    GFR: Estimated Creatinine Clearance: 60.1 mL/min (by C-G formula based on SCr of 0.71 mg/dL). Liver Function Tests: Recent Labs  Lab 10/21/22 2353 10/23/22 0402  AST 27 41  ALT 15 15  ALKPHOS 55 46  BILITOT 0.9 1.2  PROT 7.2 5.8*  ALBUMIN 3.5 2.9*    No results for input(s): "LIPASE", "AMYLASE" in the last 168 hours. Recent Labs  Lab 10/22/22 0850  AMMONIA 28    Coagulation Profile: Recent Labs  Lab 10/21/22 2353  INR 1.2    Cardiac Enzymes: Recent Labs  Lab 10/22/22 0218  CKTOTAL 105    BNP (last 3 results) No results for input(s): "PROBNP" in the last 8760 hours. HbA1C: No results for input(s): "HGBA1C" in the last 72 hours.  CBG: Recent Labs  Lab 10/25/22 1557 10/25/22 2047 10/26/22 2042 10/27/22 0039 10/27/22 0439  GLUCAP 182* 141* 159* 144* 124*    Lipid Profile: No results for input(s): "CHOL", "HDL", "LDLCALC", "TRIG", "CHOLHDL",  "LDLDIRECT" in the last 72 hours.  Thyroid Function Tests: No results for input(s): "TSH", "T4TOTAL", "FREET4", "T3FREE", "THYROIDAB" in the last 72 hours.  Anemia Panel: No results for input(s): "VITAMINB12", "FOLATE", "FERRITIN", "TIBC", "IRON", "RETICCTPCT" in the last 72 hours. Sepsis Labs: Recent Labs  Lab 10/22/22 0305 10/22/22 0633  LATICACIDVEN 1.8 1.7     Recent Results (from the past 240 hour(s))  Urine Culture     Status: None   Collection Time: 10/22/22  2:10 AM   Specimen: Urine, Clean Catch  Result Value Ref Range Status   Specimen Description URINE, CLEAN CATCH  Final   Special Requests NONE  Final   Culture   Final    NO GROWTH Performed at [facility], [place] [address], [place], [place] [address]    Report Status 10/23/2022 FINAL  Final  Resp panel by RT-PCR (RSV, Flu A&B, Covid) Anterior Nasal Swab     Status: None   Collection Time: 10/22/22  2:19 AM   Specimen: Anterior Nasal Swab  Result Value Ref Range Status   SARS Coronavirus 2 by RT PCR NEGATIVE NEGATIVE Final    Comment: (NOTE) SARS-CoV-2 target nucleic acids are NOT DETECTED.  The SARS-CoV-2 RNA is generally detectable in upper respiratory specimens during the acute phase of infection. The lowest concentration of SARS-CoV-2 viral copies this assay can detect is 138 copies/mL. A negative result does not preclude SARS-Cov-2 infection and should not be used as the sole basis for treatment or other patient management decisions. A negative result may occur with  improper specimen collection/handling, submission of specimen other than nasopharyngeal swab, presence of viral mutation(s) within the areas targeted by this assay, and inadequate number of viral copies(<138 copies/mL). A negative result must be combined with clinical observations, patient history, and epidemiological information. The expected result is Negative.  Fact Sheet for Patients:   EntrepreneurPulse.com.au  Fact Sheet for Healthcare Providers:  IncredibleEmployment.be  This test is no t yet approved or cleared by the [place] FDA and  has been authorized for detection and/or diagnosis of SARS-CoV-2 by FDA under an Emergency Use Authorization (EUA). This EUA will remain  in effect (meaning this test can be used) for the duration of the COVID-19 declaration under Section 564(b)(1) of the Act, 21 U.S.C.section 360bbb-3(b)(1), unless the authorization is terminated  or revoked sooner.       Influenza A by PCR NEGATIVE NEGATIVE Final   Influenza B by PCR NEGATIVE  NEGATIVE Final    Comment: (NOTE) The Xpert Xpress SARS-CoV-2/FLU/RSV plus assay is intended as an aid in the diagnosis of influenza from Nasopharyngeal swab specimens and should not be used as a sole basis for treatment. Nasal washings and aspirates are unacceptable for Xpert Xpress SARS-CoV-2/FLU/RSV testing.  Fact Sheet for Patients: EntrepreneurPulse.com.au  Fact Sheet for Healthcare Providers: IncredibleEmployment.be  This test is not yet approved or cleared by the [place] FDA and has been authorized for detection and/or diagnosis of SARS-CoV-2 by FDA under an Emergency Use Authorization (EUA). This EUA will remain in effect (meaning this test can be used) for the duration of the COVID-19 declaration under Section 564(b)(1) of the Act, 21 U.S.C. section 360bbb-3(b)(1), unless the authorization is terminated or revoked.     Resp Syncytial Virus by PCR NEGATIVE NEGATIVE Final    Comment: (NOTE) Fact Sheet for Patients: EntrepreneurPulse.com.au  Fact Sheet for Healthcare Providers: IncredibleEmployment.be  This test is not yet approved or cleared by the [place] FDA and has been authorized for detection and/or diagnosis of SARS-CoV-2 by FDA under an Emergency Use  Authorization (EUA). This EUA will remain in effect (meaning this test can be used) for the duration of the COVID-19 declaration under Section 564(b)(1) of the Act, 21 U.S.C. section 360bbb-3(b)(1), unless the authorization is terminated or revoked.  Performed at [facility], [place] [address], [place], [place] [address]   Blood Culture (routine x 2)     Status: None (Preliminary result)   Collection Time: 10/22/22  3:00 AM   Specimen: BLOOD  Result Value Ref Range Status   Specimen Description BLOOD SITE NOT SPECIFIED  Final   Special Requests   Final    BOTTLES DRAWN AEROBIC AND ANAEROBIC Blood Culture results may not be optimal due to an inadequate volume of blood received in culture bottles   Culture   Final    NO GROWTH 4 DAYS Performed at [facility], [place] [address], [place], [place] [address]    Report Status PENDING  Incomplete  Blood Culture (routine x 2)     Status: None (Preliminary result)   Collection Time: 10/22/22  6:33 AM   Specimen: BLOOD  Result Value Ref Range Status   Specimen Description BLOOD SITE NOT SPECIFIED  Final   Special Requests   Final    BOTTLES DRAWN AEROBIC AND ANAEROBIC Blood Culture adequate volume   Culture   Final    NO GROWTH 4 DAYS Performed at [facility], [address], [place], [place] [address]    Report Status PENDING  Incomplete    Radiology Studies: DG CHEST PORT 1 VIEW  Result Date: 10/27/2022 CLINICAL DATA:  10027. Tachypnea with right wrist pain and swelling. EXAM: PORTABLE CHEST 1 VIEW RIGHT WRIST 3 VIEWS COMPARISON:  Portable chest 10/22/2022. No prior right wrist series. FINDINGS: Portable chest single view: 11:43 p.m. There is new demonstration of a Dobbhoff feeding tube. This passes well into the stomach but the radiopaque tip is not filmed. There is moderate severe cardiomegaly again noted. Central vascular prominence noted previously is no longer seen. Prior increased interstitial consolidation of the  lung fields is no longer seen. Subpleural chronic interstitial changes in the bases are again noted. Interstitial and hazy opacities of the right upper and left lower lung fields are noted and could be due to pneumonitis or partially cleared edema. The rest of the lungs appear generally clear. There is no substantial pleural effusion. There is  aortic tortuosity and calcification with stable mediastinum. Osteopenia and degenerative change thoracic spine. Right wrist, AP lateral oblique: Moderate generalized soft tissue swelling. There are patchy calcifications in the radial and ulnar arteries which extend into the wrist and palmar arch. There is chondrocalcinosis in the wrist including of the TFCC. No soft tissue gas or radiopaque foreign body are seen. There is osteopenia without evidence of fractures or destructive lesions. Bone-on-bone first [place] joint space loss is noted with exuberant reactive osteophytes. Similar but less advanced degenerative arthrosis involving the index CMC joint. Other joints appear preserved. There is normal interosseous alignment. IMPRESSION: 1. Interstitial and hazy opacities of the right upper and left lower lung fields could be due to pneumonitis or partially cleared edema. 2. Chronic interstitial changes in the bases. 3. Stable cardiomegaly. The prior finding of generalized interstitial consolidation is not seen today. 4. New demonstration of a Dobbhoff feeding tube which passes well into the stomach but the radiopaque tip is not filmed. 5. Moderate generalized right wrist soft tissue swelling. 6. Osteopenia and degenerative change without evidence of fractures or destructive lesions. 7. Chondrocalcinosis. 8. Calcifications in the radial and ulnar arteries extending into the wrist and hand. Electronically Signed   By: Telford Nab M.D.   On: 10/27/2022 00:07   DG Wrist Complete Right  Result Date: 10/27/2022 CLINICAL DATA:  10027. Tachypnea with right wrist pain and swelling.  EXAM: PORTABLE CHEST 1 VIEW RIGHT WRIST 3 VIEWS COMPARISON:  Portable chest 10/22/2022. No prior right wrist series. FINDINGS: Portable chest single view: 11:43 p.m. There is new demonstration of a Dobbhoff feeding tube. This passes well into the stomach but the radiopaque tip is not filmed. There is moderate severe cardiomegaly again noted. Central vascular prominence noted previously is no longer seen. Prior increased interstitial consolidation of the lung fields is no longer seen. Subpleural chronic interstitial changes in the bases are again noted. Interstitial and hazy opacities of the right upper and left lower lung fields are noted and could be due to pneumonitis or partially cleared edema. The rest of the lungs appear generally clear. There is no substantial pleural effusion. There is aortic tortuosity and calcification with stable mediastinum. Osteopenia and degenerative change thoracic spine. Right wrist, AP lateral oblique: Moderate generalized soft tissue swelling. There are patchy calcifications in the radial and ulnar arteries which extend into the wrist and palmar arch. There is chondrocalcinosis in the wrist including of the TFCC. No soft tissue gas or radiopaque foreign body are seen. There is osteopenia without evidence of fractures or destructive lesions. Bone-on-bone first [place] joint space loss is noted with exuberant reactive osteophytes. Similar but less advanced degenerative arthrosis involving the index CMC joint. Other joints appear preserved. There is normal interosseous alignment. IMPRESSION: 1. Interstitial and hazy opacities of the right upper and left lower lung fields could be due to pneumonitis or partially cleared edema. 2. Chronic interstitial changes in the bases. 3. Stable cardiomegaly. The prior finding of generalized interstitial consolidation is not seen today. 4. New demonstration of a Dobbhoff feeding tube which passes well into the stomach but the radiopaque tip is not  filmed. 5. Moderate generalized right wrist soft tissue swelling. 6. Osteopenia and degenerative change without evidence of fractures or destructive lesions. 7. Chondrocalcinosis. 8. Calcifications in the radial and ulnar arteries extending into the wrist and hand. Electronically Signed   By: Telford Nab M.D.   On: 10/27/2022 00:07    Scheduled Meds:  aspirin  325 mg Per Tube  Daily   free water  50 mL Per Tube Q6H   heparin injection (subcutaneous)  5,000 Units Subcutaneous Q8H   insulin aspart  0-6 Units Subcutaneous Q4H   metoprolol tartrate  25 mg Per NG tube BID   polyethylene glycol  17 g Oral BID   QUEtiapine  25 mg Oral QHS   simvastatin  10 mg Per NG tube QPM   thiamine  100 mg Per Tube Daily   Continuous Infusions:  sodium chloride 50 mL/hr at 10/27/22 0012   feeding supplement (OSMOLITE 1.2 CAL) 60 mL/hr at 10/27/22 0023    LOS: 5 days   Time spent: 17mn  Marche Hottenstein C Elyon Zoll, DO [facility]  If 7PM-7AM, please contact night-coverage [email or web address]  10/27/2022, 7:58 AM

## 2022-10-28 DIAGNOSIS — I63411 Cerebral infarction due to embolism of right middle cerebral artery: Secondary | ICD-10-CM | POA: Diagnosis not present

## 2022-10-28 DIAGNOSIS — I482 Chronic atrial fibrillation, unspecified: Secondary | ICD-10-CM | POA: Diagnosis not present

## 2022-10-28 DIAGNOSIS — R7989 Other specified abnormal findings of blood chemistry: Secondary | ICD-10-CM | POA: Diagnosis not present

## 2022-10-28 DIAGNOSIS — G9341 Metabolic encephalopathy: Secondary | ICD-10-CM | POA: Diagnosis not present

## 2022-10-28 LAB — GLUCOSE, CAPILLARY
Glucose-Capillary: 136 mg/dL — ABNORMAL HIGH (ref 70–99)
Glucose-Capillary: 128 mg/dL — ABNORMAL HIGH (ref 70–99)
Glucose-Capillary: 130 mg/dL — ABNORMAL HIGH (ref 70–99)
Glucose-Capillary: 166 mg/dL — ABNORMAL HIGH (ref 70–99)
Glucose-Capillary: 178 mg/dL — ABNORMAL HIGH (ref 70–99)
Glucose-Capillary: 184 mg/dL — ABNORMAL HIGH (ref 70–99)
Glucose-Capillary: 150 mg/dL — ABNORMAL HIGH (ref 70–99)
Glucose-Capillary: 162 mg/dL — ABNORMAL HIGH (ref 70–99)
Glucose-Capillary: 158 mg/dL — ABNORMAL HIGH (ref 70–99)
Glucose-Capillary: 126 mg/dL — ABNORMAL HIGH (ref 70–99)

## 2022-10-28 LAB — BASIC METABOLIC PANEL
Anion gap: 9 (ref 5–15)
BUN: 22 mg/dL (ref 8–23)
CO2: 22 mmol/L (ref 22–32)
Calcium: 8.7 mg/dL — ABNORMAL LOW (ref 8.9–10.3)
Chloride: 100 mmol/L (ref 98–111)
Creatinine, Ser: 0.68 mg/dL (ref 0.61–1.24)
GFR, Estimated: >60 mL/min (ref >60)
Glucose, Bld: 130 mg/dL — ABNORMAL HIGH (ref 70–99)
Potassium: 4.5 mmol/L (ref 3.5–5.1)
Sodium: 131 mmol/L — ABNORMAL LOW (ref 135–145)

## 2022-10-28 LAB — MAGNESIUM: Magnesium: 1.9 mg/dL (ref 1.7–2.4)

## 2022-10-28 LAB — PHOSPHORUS: Phosphorus: 3.5 mg/dL (ref 2.5–4.6)

## 2022-10-28 MED ORDER — QUETIAPINE FUMARATE 50 MG PO TABS
50.0000 mg | ORAL_TABLET | Freq: Every day | ORAL | Status: DC
  Administered 2022-10-28: 50 mg via ORAL
  Filled 2022-10-28: qty 1

## 2022-10-28 NOTE — Progress Notes (Signed)
PROGRESS NOTE    Becket Balles  GYJ:[id number] DOB: September 24, 1929 DOA: 10/21/2022 PCP: Lajean Manes, MD   Brief Narrative:  Ryan Savage is a 88 y.o. male with medical history significant of atrial fibrillation not on [place] due to prior hx of bilateral subdural bleeds as well as recurrent falls, of note patient offered watchman previously but refused. Patient also has history significant for coronary artery disease, hyperlipidemia, hypertension, obstructive sleep apnea, thrombocytopenia, CVA with residual visual deficits as well  word finding difficulties who per wife first noted around 10pm evening of presentation was found to be altered/confused - unable to communicate which is not his baseline.  Assessment & Plan:   Principal Problem:   Cerebral embolism with cerebral infarction Active Problems:   Atrial fibrillation, chronic (HCC)   Elevated troponin   Acute metabolic encephalopathy  Goals of care  -Lengthy discussion with patient's wife and son at bedside, patient has a living will that indicates he would not want to be artificially kept alive via permanent feeding tube; given this new information we will continue NG tube over the weekend into next week and to continue aggressive speech therapy in hopes that patient can recover safe p.o. intake. -If by the end of the week patient is not making any progress and will not be able to tolerate p.o. safely family is in agreement to discuss moving forward with palliative care and hospice at that time. If in the interim he decompensates or has an acute issue transitioning to hospice may be more reasonable prior to the end of the week.  Acute metabolic encephalopathy, ongoing Initially secondary to multiple acute CVAs Now with waxing and waning hospital delirium/sundowning in the setting of polypharmacy -MRI confirms multiple CVA of the R MCA/PCA - largest is 4.7cm -Neuro following -likely embolic in origin given A-fib unable to tolerate  anticoagulation due to previous subdural hematoma -Continue aspirin for now per neurology -Patient's mental status continues to wax and wane, increase seroquel Discontinue ativan/morphine - limit benzo/narcotic use in this patient unless absolutely necessary.  Acute profound dysphagia Refeeding syndrome Hypovolemic hyponatremia -Secondary to above, speech continues to follow, somnolent today due to overnight events/ativan use - hopefully able to orient later to interact with speech -Core track placed 10/23/2022 -Continue to follow potassium and Phos and mag, replace as appropriate -Patient's living well indicates he would not want a PEG tube, see above plan to continue to advance diet as tolerated with NG tube removal by the end of the week; if unable to tolerate p.o. safely will transition to palliative care and hospice - likely discharge home w/ hospice.   History of subdural hematoma status postevacuation bilaterally -Patient has not been on anticoagulation despite his known history of A-fib due to high risk of bleeding   Questionable CAP -Completed antibiotics -Patient's transiently hypoxic at intake now resolved back on room air - oxygen occasionally placed overnight likely secondary to OSA as below -Repeat chest x-ray overnight without acute findings, questionable resolving effusion/right upper lobe opacifications improved from prior imaging    Atrial fib, not on anticoagulation, rate controlled -Will likely transition to DOAC after patient's oral intake improves or until after PEG is placed, asa for now per neuro -Remains in A-fib, rate controlled -Continue rate control with beta-blocker -Previously declined Watchman device, as above lengthy discussion with family will need to be had in regards to anticoagulation given patient remains high risk whether he is on anticoagulation or not given his history and above   Hypertension  -  Permissive hypertension, resolving, resume medications  as indicated -Blood pressure currently well-controlled off all prior meds   Severe 2 vessel [name] -Continue beta-blocker, statin, on aspirin given above   CHF, reduced ejection fraction -Remains euvolemic, hold diuretics, increase intake via NG tube and tube feeds with free water flushes - strict I/Os   Pulmonary hypertension, sleep apnea -Not currently on CPAP, will continue to follow clinically   Hyperlipidemia -continue statin    Aortic root ascending Aortic dilation -continue routine surveillance    DVT prophylaxis: SCDs only Code Status: full  Family Communication: wife and stepdaughter at bedside  Status is: Inpatient  Dispo: The patient is from: Home              Anticipated d/c is to: SNF              Anticipated d/c date is: 48-72+ hours              Patient currently not medically stable for discharge, continues to have ongoing mental status changes, currently requiring NG tube for nutrition, medications, and free water.  Consultants:  Neuro  Procedures:  None  Antimicrobials:  Azithromycin, ceftriaxone completed  Subjective: Overnight patient noted to be somewhat disoriented combative, was given Ativan on top of his scheduled Seroquel for fear of safety and to protect NG tube and lines.  Review of systems markedly limited this morning given above.  Objective: Vitals:   10/27/22 2105 10/27/22 2342 10/28/22 0426 10/28/22 0500  BP: 121/74 133/67 137/85   Pulse:      Resp: '16 18 20   '$ Temp: 98.1 F (36.7 C) 98.4 F (36.9 C) 97.9 F (36.6 C)   TempSrc: Oral Oral Oral   SpO2: 94% 95% 92%   Weight:    82.1 kg  Height:        Intake/Output Summary (Last 24 hours) at 10/28/2022 0734 Last data filed at 10/28/2022 0000 Gross per 24 hour  Intake 1488.35 ml  Output 800 ml  Net 688.35 ml    Filed Weights   10/24/22 0500 10/26/22 0500 10/28/22 0500  Weight: 81.5 kg 80.3 kg 82.1 kg    Examination:  General:  Pleasantly resting in bed, No acute distress.   Somnolent, poorly arousable this morning. HEENT:  Normocephalic atraumatic.  NG tube intact Neck:  Without mass or deformity.  Trachea is midline. Lungs:  Clear to auscultate bilaterally without rhonchi, wheeze, or rales. Heart:  Regular rate and rhythm.  Without murmurs, rubs, or gallops. Abdomen:  Soft, nontender, nondistended.  Without guarding or rebound. Extremities: Without cyanosis, clubbing, edema, or obvious deformity.  Data Reviewed: I have personally reviewed following labs and imaging studies  CBC: Recent Labs  Lab 10/21/22 2353 10/22/22 0002 10/22/22 0856 10/23/22 0402  WBC 6.7  --   --  6.8  NEUTROABS 3.6  --   --   --   HGB 12.5* 13.3 12.2* 11.4*  HCT 36.6* 39.0 36.0* 32.6*  MCV 92.9  --   --  89.8  PLT 159  --   --  143*    Basic Metabolic Panel: Recent Labs  Lab 10/24/22 0330 10/25/22 0423 10/26/22 0320 10/26/22 2345 10/27/22 0357 10/28/22 0404  NA  --  127* 130* 129* 132* 131*  K  --  3.6 3.7 4.1 4.1 4.5  CL  --  98 99 98 100 100  CO2  --  20* 22 21* 24 22  GLUCOSE  --  145* 144* 143* 133* 130*  BUN  --  '10 13 18 18 22  '$ CREATININE  --  0.72 0.72 0.71 0.71 0.68  CALCIUM  --  7.9* 8.4* 8.4* 8.6* 8.7*  MG 1.6* 2.0 1.8 1.8 1.8 1.9  PHOS 2.1* 2.4* 2.4*  --  3.3 3.5    GFR: Estimated Creatinine Clearance: 60.1 mL/min (by C-G formula based on SCr of 0.68 mg/dL). Liver Function Tests: Recent Labs  Lab 10/21/22 2353 10/23/22 0402  AST 27 41  ALT 15 15  ALKPHOS 55 46  BILITOT 0.9 1.2  PROT 7.2 5.8*  ALBUMIN 3.5 2.9*    No results for input(s): "LIPASE", "AMYLASE" in the last 168 hours. Recent Labs  Lab 10/22/22 0850  AMMONIA 28    Coagulation Profile: Recent Labs  Lab 10/21/22 2353  INR 1.2    Cardiac Enzymes: Recent Labs  Lab 10/22/22 0218  CKTOTAL 105    BNP (last 3 results) No results for input(s): "PROBNP" in the last 8760 hours. HbA1C: No results for input(s): "HGBA1C" in the last 72 hours.  CBG: Recent Labs  Lab  10/27/22 1322 10/27/22 1725 10/27/22 1955 10/27/22 2339 10/28/22 0405  GLUCAP 154* 151* 115* 124* 136*    Lipid Profile: No results for input(s): "CHOL", "HDL", "LDLCALC", "TRIG", "CHOLHDL", "LDLDIRECT" in the last 72 hours.  Thyroid Function Tests: No results for input(s): "TSH", "T4TOTAL", "FREET4", "T3FREE", "THYROIDAB" in the last 72 hours.  Anemia Panel: No results for input(s): "VITAMINB12", "FOLATE", "FERRITIN", "TIBC", "IRON", "RETICCTPCT" in the last 72 hours. Sepsis Labs: Recent Labs  Lab 10/22/22 0305 10/22/22 0633  LATICACIDVEN 1.8 1.7     Recent Results (from the past 240 hour(s))  Urine Culture     Status: None   Collection Time: 10/22/22  2:10 AM   Specimen: Urine, Clean Catch  Result Value Ref Range Status   Specimen Description URINE, CLEAN CATCH  Final   Special Requests NONE  Final   Culture   Final    NO GROWTH Performed at [facility], [place] [address] [place], [place] [address]    Report Status 10/23/2022 FINAL  Final  Resp panel by RT-PCR (RSV, Flu A&B, Covid) Anterior Nasal Swab     Status: None   Collection Time: 10/22/22  2:19 AM   Specimen: Anterior Nasal Swab  Result Value Ref Range Status   SARS Coronavirus 2 by RT PCR NEGATIVE NEGATIVE Final    Comment: (NOTE) SARS-CoV-2 target nucleic acids are NOT DETECTED.  The SARS-CoV-2 RNA is generally detectable in upper respiratory specimens during the acute phase of infection. The lowest concentration of SARS-CoV-2 viral copies this assay can detect is 138 copies/mL. A negative result does not preclude SARS-Cov-2 infection and should not be used as the sole basis for treatment or other patient management decisions. A negative result may occur with  improper specimen collection/handling, submission of specimen other than nasopharyngeal swab, presence of viral mutation(s) within the areas targeted by this assay, and inadequate number of viral copies(<138 copies/mL). A negative result  must be combined with clinical observations, patient history, and epidemiological information. The expected result is Negative.  Fact Sheet for Patients:  EntrepreneurPulse.com.au  Fact Sheet for Healthcare Providers:  IncredibleEmployment.be  This test is no t yet approved or cleared by the [place] FDA and  has been authorized for detection and/or diagnosis of SARS-CoV-2 by FDA under an Emergency Use Authorization (EUA). This EUA will remain  in effect (meaning this test can be used) for the duration of the  COVID-19 declaration under Section 564(b)(1) of the Act, 21 U.S.C.section 360bbb-3(b)(1), unless the authorization is terminated  or revoked sooner.       Influenza A by PCR NEGATIVE NEGATIVE Final   Influenza B by PCR NEGATIVE NEGATIVE Final    Comment: (NOTE) The Xpert Xpress SARS-CoV-2/FLU/RSV plus assay is intended as an aid in the diagnosis of influenza from Nasopharyngeal swab specimens and should not be used as a sole basis for treatment. Nasal washings and aspirates are unacceptable for Xpert Xpress SARS-CoV-2/FLU/RSV testing.  Fact Sheet for Patients: EntrepreneurPulse.com.au  Fact Sheet for Healthcare Providers: IncredibleEmployment.be  This test is not yet approved or cleared by the [place] FDA and has been authorized for detection and/or diagnosis of SARS-CoV-2 by FDA under an Emergency Use Authorization (EUA). This EUA will remain in effect (meaning this test can be used) for the duration of the COVID-19 declaration under Section 564(b)(1) of the Act, 21 U.S.C. section 360bbb-3(b)(1), unless the authorization is terminated or revoked.     Resp Syncytial Virus by PCR NEGATIVE NEGATIVE Final    Comment: (NOTE) Fact Sheet for Patients: EntrepreneurPulse.com.au  Fact Sheet for Healthcare Providers: IncredibleEmployment.be  This test is  not yet approved or cleared by the [place] FDA and has been authorized for detection and/or diagnosis of SARS-CoV-2 by FDA under an Emergency Use Authorization (EUA). This EUA will remain in effect (meaning this test can be used) for the duration of the COVID-19 declaration under Section 564(b)(1) of the Act, 21 U.S.C. section 360bbb-3(b)(1), unless the authorization is terminated or revoked.  Performed at [facility], [place] [address], [place], [place] [address]   Blood Culture (routine x 2)     Status: None   Collection Time: 10/22/22  3:00 AM   Specimen: BLOOD  Result Value Ref Range Status   Specimen Description BLOOD SITE NOT SPECIFIED  Final   Special Requests   Final    BOTTLES DRAWN AEROBIC AND ANAEROBIC Blood Culture results may not be optimal due to an inadequate volume of blood received in culture bottles   Culture   Final    NO GROWTH 5 DAYS Performed at [facility], [place] [address] [place], [place] [address]    Report Status 10/27/2022 FINAL  Final  Blood Culture (routine x 2)     Status: None   Collection Time: 10/22/22  6:33 AM   Specimen: BLOOD  Result Value Ref Range Status   Specimen Description BLOOD SITE NOT SPECIFIED  Final   Special Requests   Final    BOTTLES DRAWN AEROBIC AND ANAEROBIC Blood Culture adequate volume   Culture   Final    NO GROWTH 5 DAYS Performed at [facility], [place] [address] [place], [place] [address]    Report Status 10/27/2022 FINAL  Final    Radiology Studies: DG CHEST PORT 1 VIEW  Result Date: 10/27/2022 CLINICAL DATA:  10027. Tachypnea with right wrist pain and swelling. EXAM: PORTABLE CHEST 1 VIEW RIGHT WRIST 3 VIEWS COMPARISON:  Portable chest 10/22/2022. No prior right wrist series. FINDINGS: Portable chest single view: 11:43 p.m. There is new demonstration of a Dobbhoff feeding tube. This passes well into the stomach but the radiopaque tip is not filmed. There is moderate severe cardiomegaly again  noted. Central vascular prominence noted previously is no longer seen. Prior increased interstitial consolidation of the lung fields is no longer seen. Subpleural chronic interstitial changes in the bases are again noted. Interstitial and hazy  opacities of the right upper and left lower lung fields are noted and could be due to pneumonitis or partially cleared edema. The rest of the lungs appear generally clear. There is no substantial pleural effusion. There is aortic tortuosity and calcification with stable mediastinum. Osteopenia and degenerative change thoracic spine. Right wrist, AP lateral oblique: Moderate generalized soft tissue swelling. There are patchy calcifications in the radial and ulnar arteries which extend into the wrist and palmar arch. There is chondrocalcinosis in the wrist including of the TFCC. No soft tissue gas or radiopaque foreign body are seen. There is osteopenia without evidence of fractures or destructive lesions. Bone-on-bone first [place] joint space loss is noted with exuberant reactive osteophytes. Similar but less advanced degenerative arthrosis involving the index CMC joint. Other joints appear preserved. There is normal interosseous alignment. IMPRESSION: 1. Interstitial and hazy opacities of the right upper and left lower lung fields could be due to pneumonitis or partially cleared edema. 2. Chronic interstitial changes in the bases. 3. Stable cardiomegaly. The prior finding of generalized interstitial consolidation is not seen today. 4. New demonstration of a Dobbhoff feeding tube which passes well into the stomach but the radiopaque tip is not filmed. 5. Moderate generalized right wrist soft tissue swelling. 6. Osteopenia and degenerative change without evidence of fractures or destructive lesions. 7. Chondrocalcinosis. 8. Calcifications in the radial and ulnar arteries extending into the wrist and hand. Electronically Signed   By: Telford Nab M.D.   On: 10/27/2022 00:07   DG  Wrist Complete Right  Result Date: 10/27/2022 CLINICAL DATA:  10027. Tachypnea with right wrist pain and swelling. EXAM: PORTABLE CHEST 1 VIEW RIGHT WRIST 3 VIEWS COMPARISON:  Portable chest 10/22/2022. No prior right wrist series. FINDINGS: Portable chest single view: 11:43 p.m. There is new demonstration of a Dobbhoff feeding tube. This passes well into the stomach but the radiopaque tip is not filmed. There is moderate severe cardiomegaly again noted. Central vascular prominence noted previously is no longer seen. Prior increased interstitial consolidation of the lung fields is no longer seen. Subpleural chronic interstitial changes in the bases are again noted. Interstitial and hazy opacities of the right upper and left lower lung fields are noted and could be due to pneumonitis or partially cleared edema. The rest of the lungs appear generally clear. There is no substantial pleural effusion. There is aortic tortuosity and calcification with stable mediastinum. Osteopenia and degenerative change thoracic spine. Right wrist, AP lateral oblique: Moderate generalized soft tissue swelling. There are patchy calcifications in the radial and ulnar arteries which extend into the wrist and palmar arch. There is chondrocalcinosis in the wrist including of the TFCC. No soft tissue gas or radiopaque foreign body are seen. There is osteopenia without evidence of fractures or destructive lesions. Bone-on-bone first [place] joint space loss is noted with exuberant reactive osteophytes. Similar but less advanced degenerative arthrosis involving the index CMC joint. Other joints appear preserved. There is normal interosseous alignment. IMPRESSION: 1. Interstitial and hazy opacities of the right upper and left lower lung fields could be due to pneumonitis or partially cleared edema. 2. Chronic interstitial changes in the bases. 3. Stable cardiomegaly. The prior finding of generalized interstitial consolidation is not seen today.  4. New demonstration of a Dobbhoff feeding tube which passes well into the stomach but the radiopaque tip is not filmed. 5. Moderate generalized right wrist soft tissue swelling. 6. Osteopenia and degenerative change without evidence of fractures or destructive lesions. 7. Chondrocalcinosis. 8. Calcifications  in the radial and ulnar arteries extending into the wrist and hand. Electronically Signed   By: Telford Nab M.D.   On: 10/27/2022 00:07    Scheduled Meds:  aspirin  325 mg Per Tube Daily   free water  50 mL Per Tube Q6H   heparin injection (subcutaneous)  5,000 Units Subcutaneous Q8H   insulin aspart  0-6 Units Subcutaneous Q4H   metoprolol tartrate  25 mg Per NG tube BID   polyethylene glycol  17 g Oral BID   QUEtiapine  25 mg Oral QHS   simvastatin  10 mg Per NG tube QPM   thiamine  100 mg Per Tube Daily   Continuous Infusions:  feeding supplement (OSMOLITE 1.2 CAL) 1,000 mL (10/28/22 0721)    LOS: 6 days   Time spent: 18mn  Alissia Lory C Tamera Pingley, DO [facility]  If 7PM-7AM, please contact night-coverage [email or web address]  10/28/2022, 7:34 AM

## 2022-10-28 NOTE — Plan of Care (Signed)
  Problem: Activity: Goal: Ability to tolerate increased activity will improve Outcome: Not Progressing   Problem: Clinical Measurements: Goal: Ability to maintain a body temperature in the normal range will improve Outcome: Not Progressing   Problem: Respiratory: Goal: Ability to maintain adequate ventilation will improve Outcome: Not Progressing Goal: Ability to maintain a clear airway will improve Outcome: Not Progressing   Problem: Education: Goal: Knowledge of disease or condition will improve Outcome: Not Progressing Goal: Knowledge of secondary prevention will improve (MUST DOCUMENT ALL) Outcome: Not Progressing Goal: Knowledge of patient specific risk factors will improve Elta [place] N/A or DELETE if not current risk factor) Outcome: Not Progressing   Problem: Ischemic Stroke/TIA Tissue Perfusion: Goal: Complications of ischemic stroke/TIA will be minimized Outcome: Not Progressing   Problem: Coping: Goal: Will verbalize positive feelings about self Outcome: Not Progressing Goal: Will identify appropriate support needs Outcome: Not Progressing   Problem: Health Behavior/Discharge Planning: Goal: Ability to manage health-related needs will improve Outcome: Not Progressing Goal: Goals will be collaboratively established with patient/family Outcome: Not Progressing   Problem: Self-Care: Goal: Ability to participate in self-care as condition permits will improve Outcome: Not Progressing Goal: Verbalization of feelings and concerns over difficulty with self-care will improve Outcome: Not Progressing Goal: Ability to communicate needs accurately will improve Outcome: Not Progressing   Problem: Nutrition: Goal: Risk of aspiration will decrease Outcome: Not Progressing Goal: Dietary intake will improve Outcome: Not Progressing   Problem: Education: Goal: Knowledge of General Education information will improve Description: Including pain rating scale, medication(s)/side  effects and non-pharmacologic comfort measures Outcome: Not Progressing   Problem: Health Behavior/Discharge Planning: Goal: Ability to manage health-related needs will improve Outcome: Not Progressing   Problem: Clinical Measurements: Goal: Ability to maintain clinical measurements within normal limits will improve Outcome: Not Progressing Goal: Will remain free from infection Outcome: Not Progressing Goal: Diagnostic test results will improve Outcome: Not Progressing Goal: Respiratory complications will improve Outcome: Not Progressing Goal: Cardiovascular complication will be avoided Outcome: Not Progressing   Problem: Activity: Goal: Risk for activity intolerance will decrease Outcome: Not Progressing   Problem: Nutrition: Goal: Adequate nutrition will be maintained Outcome: Not Progressing   Problem: Coping: Goal: Level of anxiety will decrease Outcome: Not Progressing   Problem: Elimination: Goal: Will not experience complications related to bowel motility Outcome: Not Progressing Goal: Will not experience complications related to urinary retention Outcome: Not Progressing   Problem: Pain Managment: Goal: General experience of comfort will improve Outcome: Not Progressing   Problem: Safety: Goal: Ability to remain free from injury will improve Outcome: Not Progressing   Problem: Skin Integrity: Goal: Risk for impaired skin integrity will decrease Outcome: Not Progressing   Problem: Education: Goal: Ability to describe self-care measures that may prevent or decrease complications (Diabetes Survival Skills Education) will improve Outcome: Not Progressing Goal: Individualized Educational Video(s) Outcome: Not Progressing   Problem: Coping: Goal: Ability to adjust to condition or change in health will improve Outcome: Not Progressing   Problem: Fluid Volume: Goal: Ability to maintain a balanced intake and output will improve Outcome: Not Progressing

## 2022-10-28 NOTE — Progress Notes (Signed)
Nutrition Follow-up  DOCUMENTATION CODES:   Not applicable  INTERVENTION:  - Continue current TF  Osmolite 1.2  at 60 ml/h (1440 ml per day) Free water 31m q6h Provides 1728 kcal, 80 gm protein, 1181 ml free water daily (13843mTF+flush)  NUTRITION DIAGNOSIS:   Inadequate oral intake related to dysphagia as evidenced by NPO status.  GOAL:   Patient will meet greater than or equal to 90% of their needs  MONITOR:   Diet advancement, Labs, Weight trends, Skin, TF tolerance  REASON FOR ASSESSMENT:   Consult Enteral/tube feeding initiation and management  ASSESSMENT:   Pt with hx of HTN, HLD, [name], and hx CVA presented to ED with stroke like symptoms. Imaging showed multiple acute cortically-based infarcts within the right MCA territory and right MCA/PCA watershed territory.  Meds reviewed: sliding scale insulin, miralax, thiamine. Labs reviewed: Na low.   Pt is receiving TF at goal rate via Cortrak. RN reports that the pt is tolerating TF well. However, pt without BM since 12/14, plan is to start pt on Miralax. MD also states in his note, that if pt is not able to advance hi diet and tolerate food PO Cortrak will be removed and pt will be transitioned to hospice. RD will continue to monitor POC.   Diet Order:   Diet Order             Diet NPO time specified  Diet effective now                   EDUCATION NEEDS:   Not appropriate for education at this time  Skin:  Skin Assessment: Reviewed RN Assessment  Last BM:  10/24/22  Height:   Ht Readings from Last 1 Encounters:  10/22/22 '5\' 9"'$  (1.753 m)    Weight:   Wt Readings from Last 1 Encounters:  10/28/22 82.1 kg    Ideal Body Weight:  72.7 kg  BMI:  Body mass index is 26.73 kg/m.  Estimated Nutritional Needs:   Kcal:  1700-1900 kcal/d  Protein:  80-95g/d  Fluid:  >/=1.8L/d  AbThalia BloodgoodRD, LDN, CNSC.

## 2022-10-28 NOTE — Progress Notes (Signed)
Speech Language Pathology Treatment: Dysphagia  Patient Details Name: Shiquan Mathieu MRN: [id number] DOB: 28-Sep-1929 Today's Date: 10/28/2022 Time: 7169-6789 SLP Time Calculation (min) (ACUTE ONLY): 20 min  Assessment / Plan / Recommendation Clinical Impression  Pt seen for dysphagia f/u visit for PO readiness.  Pt allowed oral care completion with xerostomia present and dry, sticky lingual/oral secretions observed.  Pt was agitated/confused and lethargic upon arrival, but could follow simple oral directives such as "Stick out your tongue" and "Open your mouth" during oral care.  Pt given ice chips with min lingual manipulation and eventual removal from oral cavity.  Pt did initiate a dry swallow several times post PO intake attempt, but vocal quality remained hypophonic and min wet when pt would attempt verbalizations (although primarily unintelligible).  Recommend pt remain NPO and ST will f/u in acute setting for PO readiness.    HPI HPI: 88 yo male adm to [facility] with AMS, left side weakness. 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 He did not pass 3 ounce [place] and swallow eval ordered. Per neuro note, likely source of CVAs is Afib. CXR showed "Cardiomegaly. Bilateral interstitial and airspace opacities, left greater than right could reflect asymmetric edema or infection." ST f/u for dysphagia tx/PO readiness.      SLP Plan  Continue with current plan of care;Other (Comment) (potential MBS if alertness level improves)      Recommendations for follow up therapy are one component of a multi-disciplinary discharge planning process, led by the attending physician.  Recommendations may be updated based on patient status, additional functional criteria and insurance authorization.    Recommendations  Diet recommendations: NPO Medication Administration: Via alternative means                 Oral Care Recommendations: Oral care QID;Staff/trained caregiver to provide oral care Follow Up Recommendations: Follow physician's recommendations for discharge plan and follow up therapies (TBD) Assistance recommended at discharge: Frequent or constant Supervision/Assistance SLP Visit Diagnosis: Dysphagia, oropharyngeal phase (R13.12) Plan: Continue with current plan of care;Other (Comment) (potential MBS if alertness level improves)           Elvina Sidle, M.S., CCC-SLP Speech language pathologist Acute rehabilitation 10/28/2022, 1:53 PM

## 2022-10-29 DIAGNOSIS — I482 Chronic atrial fibrillation, unspecified: Secondary | ICD-10-CM | POA: Diagnosis not present

## 2022-10-29 DIAGNOSIS — I63411 Cerebral infarction due to embolism of right middle cerebral artery: Secondary | ICD-10-CM | POA: Diagnosis not present

## 2022-10-29 DIAGNOSIS — R7989 Other specified abnormal findings of blood chemistry: Secondary | ICD-10-CM | POA: Diagnosis not present

## 2022-10-29 DIAGNOSIS — G9341 Metabolic encephalopathy: Secondary | ICD-10-CM | POA: Diagnosis not present

## 2022-10-29 LAB — BASIC METABOLIC PANEL
Anion gap: 11 (ref 5–15)
BUN: 23 mg/dL (ref 8–23)
CO2: 22 mmol/L (ref 22–32)
Calcium: 8.9 mg/dL (ref 8.9–10.3)
Chloride: 99 mmol/L (ref 98–111)
Creatinine, Ser: 0.68 mg/dL (ref 0.61–1.24)
GFR, Estimated: >60 mL/min (ref >60)
Glucose, Bld: 139 mg/dL — ABNORMAL HIGH (ref 70–99)
Potassium: 4.3 mmol/L (ref 3.5–5.1)
Sodium: 132 mmol/L — ABNORMAL LOW (ref 135–145)

## 2022-10-29 LAB — GLUCOSE, CAPILLARY
Glucose-Capillary: 127 mg/dL — ABNORMAL HIGH (ref 70–99)
Glucose-Capillary: 148 mg/dL — ABNORMAL HIGH (ref 70–99)
Glucose-Capillary: 151 mg/dL — ABNORMAL HIGH (ref 70–99)
Glucose-Capillary: 154 mg/dL — ABNORMAL HIGH (ref 70–99)
Glucose-Capillary: 162 mg/dL — ABNORMAL HIGH (ref 70–99)
Glucose-Capillary: 168 mg/dL — ABNORMAL HIGH (ref 70–99)

## 2022-10-29 LAB — MAGNESIUM: Magnesium: 1.8 mg/dL (ref 1.7–2.4)

## 2022-10-29 LAB — PHOSPHORUS: Phosphorus: 4.1 mg/dL (ref 2.5–4.6)

## 2022-10-29 MED ORDER — POLYETHYLENE GLYCOL 3350 17 G PO PACK
17.0000 g | PACK | Freq: Two times a day (BID) | ORAL | Status: DC
  Administered 2022-10-29 – 2022-10-31 (×3): 17 g
  Filled 2022-10-29 (×3): qty 1

## 2022-10-29 MED ORDER — QUETIAPINE FUMARATE 100 MG PO TABS
100.0000 mg | ORAL_TABLET | Freq: Every day | ORAL | Status: DC
  Administered 2022-10-29 – 2022-10-30 (×2): 100 mg via ORAL
  Filled 2022-10-29 (×2): qty 1

## 2022-10-29 NOTE — Progress Notes (Signed)
Occupational Therapy Treatment Patient Details Name: Ryan Savage MRN: [id number] DOB: February 15, 1929 Today's Date: 10/29/2022   History of present illness Ryan Savage is a 88 year old with a past medical history that includes atrial fibrillation not on anticoagulation due to bilateral subdural bleeds and recurrent falls that required evacuation, coronary artery disease, hyperlipidemia, hypertension, sleep apnea, thrombocytopenia, prior strokes with residual right homonymous hemianopsia as well as residual word finding difficulty brought in for emergent evaluation of left-sided weakness and strokelike symptoms. MRI showed multiple acute cortically-based infarcts within the right MCA territory and right MCA/PCA watershed territory   OT comments  Pt presented in bed at this time and required increase in time to arousal pt as noted often closing eyes but noted as increase in activity the pt was able to increase in ability to keep eyes open in session with bed level ADLS. Physical therapy then came into session and due to safety completed part co treatment to increase in community mobility at this time for safety. Nursing was made aware about secretions and possible NG tube discomfort. Pt currently with functional limitations due to the deficits listed below (see OT Problem List).  Pt will benefit from skilled OT to increase their safety and independence with ADL and functional mobility for ADL to facilitate discharge to venue listed below.     Recommendations for follow up therapy are one component of a multi-disciplinary discharge planning process, led by the attending physician.  Recommendations may be updated based on patient status, additional functional criteria and insurance authorization.    Follow Up Recommendations  Skilled nursing-short term rehab (<3 hours/day)     Assistance Recommended at Discharge Frequent or constant Supervision/Assistance  Patient can return home with the  following  A lot of help with walking and/or transfers;A lot of help with bathing/dressing/bathroom;Assistance with cooking/housework;Assistance with feeding;Direct supervision/assist for medications management;Direct supervision/assist for financial management;Assist for transportation;Help with stairs or ramp for entrance   Equipment Recommendations  Other (comment) (TBD at the next level)    Recommendations for Other Services      Precautions / Restrictions Precautions Precautions: Fall;Other (comment) Precaution Comments: cortreck, seizure and aspiration Restrictions Weight Bearing Restrictions: No       Mobility Bed Mobility Overal bed mobility: Needs Assistance Bed Mobility: Supine to Sit Rolling: Min assist   Supine to sit: Mod assist Sit to supine: Mod assist   General bed mobility comments: mod assist to manage LEs and eleavte trunk, pt with poor inititaion    Transfers Overall transfer level: Needs assistance Equipment used: Rolling walker (2 wheels) Transfers: Sit to/from Stand Sit to Stand: Min assist, +2 safety/equipment           General transfer comment: stood from EOB and reclienr with RW light min A to facilitate anterior weight shift     Balance Overall balance assessment: Needs assistance Sitting-balance support: Bilateral upper extremity supported Sitting balance-Leahy Scale: Fair Sitting balance - Comments: able to maintain static and dynamic sitting balance with close supervision   Standing balance support: Bilateral upper extremity supported, During functional activity Standing balance-Leahy Scale: Poor Standing balance comment: required min guard for static standing balance and min A for dynamic standing balance                           ADL either performed or assessed with clinical judgement   ADL Overall ADL's : Needs assistance/impaired Eating/Feeding: NPO   Grooming: Wash/dry hands;Wash/dry face;Min guard;Sitting;Bed  level  Upper Body Bathing: Minimal assistance;Sitting   Lower Body Bathing: Maximal assistance;Sit to/from stand;Total assistance   Upper Body Dressing : Minimal assistance;Sitting   Lower Body Dressing: Maximal assistance;Total assistance;Sit to/from stand   Toilet Transfer: Min guard;Minimal assistance;Cueing for safety;Cueing for sequencing;Rolling walker (2 wheels);BSC/3in1   Toileting- Clothing Manipulation and Hygiene: Maximal assistance;Total assistance;Sit to/from stand       Functional mobility during ADLs: Minimal assistance;Cueing for safety;Cueing for sequencing;Rolling walker (2 wheels) General ADL Comments: Pt needs assist with controling speed of walker and stability    Extremity/Trunk Assessment Upper Extremity Assessment Upper Extremity Assessment: Generalized weakness   Lower Extremity Assessment Lower Extremity Assessment: Defer to PT evaluation        Vision   Vision Assessment?: Vision impaired- to be further tested in functional context Additional Comments: Pt noted to often close eyes in session   Perception     Praxis      Cognition Arousal/Alertness: Lethargic, Awake/alert Behavior During Therapy: WFL for tasks assessed/performed Overall Cognitive Status: History of cognitive impairments - at baseline                                 General Comments: Pt became more awake/alert once sitting EOB, however pt continues to close eyes intermittently during session needing prompts for alertness        Exercises      Shoulder Instructions       General Comments family present at the end of session and made aware about calling for assistance if pt noted to slide in chair or increase in secretions. Nurse was also made aware about needing more suction and NG tube was wrapped around arm at the begining of session and was rubing at the begingin of session    Pertinent Vitals/ Pain       Pain Assessment Pain Assessment: Faces Faces  Pain Scale: Hurts a little bit Pain Location: general, rubbing nose Pain Descriptors / Indicators: Grimacing Pain Intervention(s): Limited activity within patient's tolerance, Monitored during session, Repositioned  Home Living                                          Prior Functioning/Environment              Frequency  Min 2X/week        Progress Toward Goals  OT Goals(current goals can now be found in the care plan section)  Progress towards OT goals: Progressing toward goals  Acute Rehab OT Goals Patient Stated Goal: unable to reprot OT Goal Formulation: With patient Time For Goal Achievement: 11/06/22 Potential to Achieve Goals: Good ADL Goals Pt Will Perform Grooming: with min guard assist;standing Pt Will Perform Upper Body Dressing: with modified independence;sitting Pt Will Perform Lower Body Dressing: with supervision;with adaptive equipment;sit to/from stand Pt Will Transfer to Toilet: with min guard assist;stand pivot transfer;bedside commode  Plan Discharge plan remains appropriate    Co-evaluation                 AM-PAC OT "6 Clicks" Daily Activity     Outcome Measure   Help from another person eating meals?: Total Help from another person taking care of personal grooming?: A Little Help from another person toileting, which includes using toliet, bedpan, or urinal?: A Lot Help from another person bathing (including washing,  rinsing, drying)?: A Lot Help from another person to put on and taking off regular upper body clothing?: A Little Help from another person to put on and taking off regular lower body clothing?: A Lot 6 Click Score: 13    End of Session Equipment Utilized During Treatment: Gait belt;Rolling walker (2 wheels)  OT Visit Diagnosis: Unsteadiness on feet (R26.81);Muscle weakness (generalized) (M62.81);Other symptoms and signs involving cognitive function;Low vision, both eyes (H54.2);Cognitive communication  deficit (R41.841);Feeding difficulties (R63.3) Symptoms and signs involving cognitive functions: Cerebral infarction   Activity Tolerance Patient tolerated treatment well;Patient limited by lethargy   Patient Left in chair;with call bell/phone within reach;with chair alarm set;with family/visitor present   Nurse Communication Mobility status        Time: 0922-1009 OT Time Calculation (min): 47 min  Charges: OT General Charges $OT Visit: 1 Visit OT Treatments $Self Care/Home Management : 38-52 mins  Joeseph Amor OTR/L  Acute Rehab Services  [phone] office number    Joeseph Amor 10/29/2022, 12:05 PM

## 2022-10-29 NOTE — Progress Notes (Signed)
PROGRESS NOTE    Ryan Savage  GUY:[id number] DOB: May 03, 1929 DOA: 10/21/2022 PCP: Lajean Manes, MD   Brief Narrative:  Ryan Savage is a 88 y.o. male with medical history significant of atrial fibrillation not on [place] due to prior hx of bilateral subdural bleeds as well as recurrent falls, of note patient offered watchman previously but refused. Patient also has history significant for coronary artery disease, hyperlipidemia, hypertension, obstructive sleep apnea, thrombocytopenia, CVA with residual visual deficits as well  word finding difficulties who per wife first noted around 10pm evening of presentation was found to be altered/confused - unable to communicate which is not his baseline.  Assessment & Plan:   Principal Problem:   Cerebral embolism with cerebral infarction Active Problems:   Atrial fibrillation, chronic (HCC)   Elevated troponin   Acute metabolic encephalopathy   Goals of care  -Lengthy discussion with patient's wife and son at bedside daily, patient has a living will that indicates he would not want to be artificially kept alive via permanent feeding tube; -Given patient's worsening mental status family is likely to move forward with palliative care and hospice, palliative care has been consulted formally to further discuss discharge planning with family including hospice at home versus hospice house. -Likely discontinue NG tube in the next 24 hours pending family discussion with palliative care  Acute metabolic encephalopathy, ongoing Initially secondary to multiple acute CVAs Now with waxing and waning hospital delirium/sundowning in the setting of polypharmacy -MRI confirms multiple CVA of the R MCA/PCA - largest is 4.7cm -Neuro following -likely embolic in origin given A-fib unable to tolerate anticoagulation due to previous subdural hematoma -Continue aspirin for now per neurology -Patient's mental status continues to wax and wane, increase seroquel  dose again.  -Discontinue ativan/morphine - limit benzo/narcotic use in this patient unless absolutely necessary.  Acute profound dysphagia Refeeding syndrome Hypovolemic hyponatremia -Secondary to above, speech continues to follow, somnolent today due to overnight events/ativan use - hopefully able to orient later to interact with speech -Core track placed 10/23/2022 -Continue to follow potassium and Phos and mag, replace as appropriate -Patient's living well indicates he would not want a PEG tube, see above plan to continue to advance diet as tolerated with NG tube removal by the end of the week; if unable to tolerate p.o. safely will transition to palliative care and hospice - likely discharge home w/ hospice.   History of subdural hematoma status postevacuation bilaterally -Patient has not been on anticoagulation despite his known history of A-fib due to high risk of bleeding   Questionable CAP -Completed antibiotics -Patient's transiently hypoxic at intake now resolved back on room air - oxygen occasionally placed overnight likely secondary to OSA as below -Repeat chest x-ray overnight without acute findings, questionable resolving effusion/right upper lobe opacifications improved from prior imaging    Atrial fib, not on anticoagulation, rate controlled -Will likely transition to DOAC after patient's oral intake improves or until after PEG is placed, asa for now per neuro -Remains in A-fib, rate controlled -Continue rate control with beta-blocker -Previously declined Watchman device, as above lengthy discussion with family will need to be had in regards to anticoagulation given patient remains high risk whether he is on anticoagulation or not given his history and above   Hypertension  -Permissive hypertension, resolving, resume medications as indicated -Blood pressure currently well-controlled off all prior meds   Severe 2 vessel [name] -Continue beta-blocker, statin, on aspirin  given above   CHF, reduced ejection fraction -Remains euvolemic,  hold diuretics, increase intake via NG tube and tube feeds with free water flushes - strict I/Os   Pulmonary hypertension, sleep apnea -Not currently on CPAP, will continue to follow clinically   Hyperlipidemia -continue statin    Aortic root ascending Aortic dilation -continue routine surveillance    DVT prophylaxis: SCDs only Code Status: full  Family Communication: wife and stepdaughter at bedside  Status is: Inpatient  Dispo: The patient is from: Home              Anticipated d/c is to: SNF              Anticipated d/c date is: 48-72+ hours              Patient currently not medically stable for discharge, continues to have ongoing mental status changes, currently requiring NG tube for nutrition, medications, and free water.  Consultants:  Neuro  Procedures:  None  Antimicrobials:  Azithromycin, ceftriaxone completed  Subjective: Overnight patient noted to be somewhat disoriented combative, was given Ativan on top of his scheduled Seroquel for fear of safety and to protect NG tube and lines.  Review of systems markedly limited this morning given above.  Objective: Vitals:   10/29/22 0023 10/29/22 0300 10/29/22 0346 10/29/22 0600  BP: (!) 143/70 (!) 150/103    Pulse: 70 71    Resp: (!) 22 20    Temp: 98 F (36.7 C) 98.4 F (36.9 C)    TempSrc: Oral Axillary    SpO2: 95% 96% 98%   Weight:    81.3 kg  Height:        Intake/Output Summary (Last 24 hours) at 10/29/2022 0730 Last data filed at 10/28/2022 2000 Gross per 24 hour  Intake 1066 ml  Output 950 ml  Net 116 ml    Filed Weights   10/26/22 0500 10/28/22 0500 10/29/22 0600  Weight: 80.3 kg 82.1 kg 81.3 kg    Examination:  General:  Pleasantly resting in bed, No acute distress.  Somnolent, poorly arousable this morning. HEENT:  Normocephalic atraumatic.  NG tube intact Neck:  Without mass or deformity.  Trachea is midline. Lungs:   Clear to auscultate bilaterally without rhonchi, wheeze, or rales. Heart:  Regular rate and rhythm.  Without murmurs, rubs, or gallops. Abdomen:  Soft, nontender, nondistended.  Without guarding or rebound. Extremities: Without cyanosis, clubbing, edema, or obvious deformity.  Data Reviewed: I have personally reviewed following labs and imaging studies  CBC: Recent Labs  Lab 10/22/22 0856 10/23/22 0402  WBC  --  6.8  HGB 12.2* 11.4*  HCT 36.0* 32.6*  MCV  --  89.8  PLT  --  143*    Basic Metabolic Panel: Recent Labs  Lab 10/25/22 0423 10/26/22 0320 10/26/22 2345 10/27/22 0357 10/28/22 0404 10/29/22 0405  NA 127* 130* 129* 132* 131* 132*  K 3.6 3.7 4.1 4.1 4.5 4.3  CL 98 99 98 100 100 99  CO2 20* 22 21* '24 22 22  '$ GLUCOSE 145* 144* 143* 133* 130* 139*  BUN '10 13 18 18 22 23  '$ CREATININE 0.72 0.72 0.71 0.71 0.68 0.68  CALCIUM 7.9* 8.4* 8.4* 8.6* 8.7* 8.9  MG 2.0 1.8 1.8 1.8 1.9 1.8  PHOS 2.4* 2.4*  --  3.3 3.5 4.1    GFR: Estimated Creatinine Clearance: 60.1 mL/min (by C-G formula based on SCr of 0.68 mg/dL). Liver Function Tests: Recent Labs  Lab 10/23/22 0402  AST 41  ALT 15  ALKPHOS 46  BILITOT  1.2  PROT 5.8*  ALBUMIN 2.9*    No results for input(s): "LIPASE", "AMYLASE" in the last 168 hours. Recent Labs  Lab 10/22/22 0850  AMMONIA 28    Coagulation Profile: No results for input(s): "INR", "PROTIME" in the last 168 hours.  Cardiac Enzymes: No results for input(s): "CKTOTAL", "CKMB", "CKMBINDEX", "TROPONINI" in the last 168 hours.  BNP (last 3 results) No results for input(s): "PROBNP" in the last 8760 hours. HbA1C: No results for input(s): "HGBA1C" in the last 72 hours.  CBG: Recent Labs  Lab 10/28/22 1224 10/28/22 1804 10/28/22 2215 10/29/22 0022 10/29/22 0405  GLUCAP 162* 158* 126* 151* 148*    Lipid Profile: No results for input(s): "CHOL", "HDL", "LDLCALC", "TRIG", "CHOLHDL", "LDLDIRECT" in the last 72 hours.  Thyroid Function  Tests: No results for input(s): "TSH", "T4TOTAL", "FREET4", "T3FREE", "THYROIDAB" in the last 72 hours.  Anemia Panel: No results for input(s): "VITAMINB12", "FOLATE", "FERRITIN", "TIBC", "IRON", "RETICCTPCT" in the last 72 hours. Sepsis Labs: No results for input(s): "PROCALCITON", "LATICACIDVEN" in the last 168 hours.   Recent Results (from the past 240 hour(s))  Urine Culture     Status: None   Collection Time: 10/22/22  2:10 AM   Specimen: Urine, Clean Catch  Result Value Ref Range Status   Specimen Description URINE, CLEAN CATCH  Final   Special Requests NONE  Final   Culture   Final    NO GROWTH Performed at [facility], [address], [place], [place] [address]    Report Status 10/23/2022 FINAL  Final  Resp panel by RT-PCR (RSV, Flu A&B, Covid) Anterior Nasal Swab     Status: None   Collection Time: 10/22/22  2:19 AM   Specimen: Anterior Nasal Swab  Result Value Ref Range Status   SARS Coronavirus 2 by RT PCR NEGATIVE NEGATIVE Final    Comment: (NOTE) SARS-CoV-2 target nucleic acids are NOT DETECTED.  The SARS-CoV-2 RNA is generally detectable in upper respiratory specimens during the acute phase of infection. The lowest concentration of SARS-CoV-2 viral copies this assay can detect is 138 copies/mL. A negative result does not preclude SARS-Cov-2 infection and should not be used as the sole basis for treatment or other patient management decisions. A negative result may occur with  improper specimen collection/handling, submission of specimen other than nasopharyngeal swab, presence of viral mutation(s) within the areas targeted by this assay, and inadequate number of viral copies(<138 copies/mL). A negative result must be combined with clinical observations, patient history, and epidemiological information. The expected result is Negative.  Fact Sheet for Patients:  EntrepreneurPulse.com.au  Fact Sheet for Healthcare Providers:   IncredibleEmployment.be  This test is no t yet approved or cleared by the [place] FDA and  has been authorized for detection and/or diagnosis of SARS-CoV-2 by FDA under an Emergency Use Authorization (EUA). This EUA will remain  in effect (meaning this test can be used) for the duration of the COVID-19 declaration under Section 564(b)(1) of the Act, 21 U.S.C.section 360bbb-3(b)(1), unless the authorization is terminated  or revoked sooner.       Influenza A by PCR NEGATIVE NEGATIVE Final   Influenza B by PCR NEGATIVE NEGATIVE Final    Comment: (NOTE) The Xpert Xpress SARS-CoV-2/FLU/RSV plus assay is intended as an aid in the diagnosis of influenza from Nasopharyngeal swab specimens and should not be used as a sole basis for treatment. Nasal washings and aspirates are unacceptable for Xpert Xpress SARS-CoV-2/FLU/RSV testing.  Fact Sheet for Patients:  EntrepreneurPulse.com.au  Fact Sheet for Healthcare Providers: IncredibleEmployment.be  This test is not yet approved or cleared by the [place] FDA and has been authorized for detection and/or diagnosis of SARS-CoV-2 by FDA under an Emergency Use Authorization (EUA). This EUA will remain in effect (meaning this test can be used) for the duration of the COVID-19 declaration under Section 564(b)(1) of the Act, 21 U.S.C. section 360bbb-3(b)(1), unless the authorization is terminated or revoked.     Resp Syncytial Virus by PCR NEGATIVE NEGATIVE Final    Comment: (NOTE) Fact Sheet for Patients: EntrepreneurPulse.com.au  Fact Sheet for Healthcare Providers: IncredibleEmployment.be  This test is not yet approved or cleared by the [place] FDA and has been authorized for detection and/or diagnosis of SARS-CoV-2 by FDA under an Emergency Use Authorization (EUA). This EUA will remain in effect (meaning this test can be used) for  the duration of the COVID-19 declaration under Section 564(b)(1) of the Act, 21 U.S.C. section 360bbb-3(b)(1), unless the authorization is terminated or revoked.  Performed at [facility], [place] [address] [place], [place] [address]   Blood Culture (routine x 2)     Status: None   Collection Time: 10/22/22  3:00 AM   Specimen: BLOOD  Result Value Ref Range Status   Specimen Description BLOOD SITE NOT SPECIFIED  Final   Special Requests   Final    BOTTLES DRAWN AEROBIC AND ANAEROBIC Blood Culture results may not be optimal due to an inadequate volume of blood received in culture bottles   Culture   Final    NO GROWTH 5 DAYS Performed at [facility], [place] [address] [place], [place] [address]    Report Status 10/27/2022 FINAL  Final  Blood Culture (routine x 2)     Status: None   Collection Time: 10/22/22  6:33 AM   Specimen: BLOOD  Result Value Ref Range Status   Specimen Description BLOOD SITE NOT SPECIFIED  Final   Special Requests   Final    BOTTLES DRAWN AEROBIC AND ANAEROBIC Blood Culture adequate volume   Culture   Final    NO GROWTH 5 DAYS Performed at [facility], [place] [address] [place],  [address]    Report Status 10/27/2022 FINAL  Final    Radiology Studies: No results found.  Scheduled Meds:  aspirin  325 mg Per Tube Daily   free water  50 mL Per Tube Q6H   heparin injection (subcutaneous)  5,000 Units Subcutaneous Q8H   insulin aspart  0-6 Units Subcutaneous Q4H   metoprolol tartrate  25 mg Per NG tube BID   polyethylene glycol  17 g Oral BID   QUEtiapine  50 mg Oral QHS   simvastatin  10 mg Per NG tube QPM   thiamine  100 mg Per Tube Daily   Continuous Infusions:  feeding supplement (OSMOLITE 1.2 CAL) 1,000 mL (10/29/22 0200)    LOS: 7 days   Time spent: 79mn  Jacoria Keiffer C Halimah Bewick, DO [facility]  If 7PM-7AM, please contact night-coverage [email or web address]  10/29/2022, 7:30 AM

## 2022-10-29 NOTE — Progress Notes (Signed)
Speech Language Pathology Treatment: Dysphagia  Patient Details Name: Ryan Savage MRN: [id number] DOB: May 20, 1929 Today's Date: 10/29/2022 Time: 5400-8676 SLP Time Calculation (min) (ACUTE ONLY): 21 min  Assessment / Plan / Recommendation Clinical Impression  Pt up in chair, lethargic, trying to talk but totally dysarthric, can't sustain arousal for more than 30 seconds despite cues. Son reports it ahs been a struggle to keep him calm vs oversedated and he didn't sleep the night before. Pt is interested in PO, but cannot orally manipulate ice or puree. He has wet vocal quality, coughing that doesn't clear secretions. There is overt lingual and facial weakness. Son observing how impaired his father is. Reports he recognizes pt is likely to suffer with ongoing efforts to rehabilitate swallowing that may ultimately be futile given severity. Son would like to discuss plan of care further at this time with MD or palliative. SLP will follow for needs.    HPI HPI: 88 yo male adm to [facility] with AMS, left side weakness. 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 He did not pass 3 ounce Yale and swallow eval ordered. Per neuro note, likely source of CVAs is Afib. CXR showed "Cardiomegaly. Bilateral interstitial and airspace opacities, left greater than right could reflect asymmetric edema or infection." ST f/u for dysphagia tx/PO readiness.      SLP Plan  Continue with current plan of care;Other (Comment)      Recommendations for follow up therapy are one component of a multi-disciplinary discharge planning process, led by the attending physician.  Recommendations may be updated based on patient status, additional functional criteria and insurance authorization.    Recommendations  Diet recommendations: NPO Medication Administration: Via alternative means                Oral Care Recommendations:  Oral care QID;Staff/trained caregiver to provide oral care Follow Up Recommendations: Follow physician's recommendations for discharge plan and follow up therapies Plan: Continue with current plan of care;Other (Comment)           Adiel Erney, Katherene Ponto  10/29/2022, 11:54 AM

## 2022-10-29 NOTE — Progress Notes (Signed)
Physical Therapy Treatment Patient Details Name: Ryan Savage MRN: [id number] DOB: [birth year] Today's Date: 10/29/2022   History of Present Illness Toua Urieta is a 88 year old with a past medical history that includes atrial fibrillation not on anticoagulation due to bilateral subdural bleeds and recurrent falls that required evacuation, coronary artery disease, hyperlipidemia, hypertension, sleep apnea, thrombocytopenia, prior strokes with residual right homonymous hemianopsia as well as residual word finding difficulty brought in for emergent evaluation of left-sided weakness and strokelike symptoms. MRI showed multiple acute cortically-based infarcts within the right MCA territory and right MCA/PCA watershed territory    PT Comments    Pt received propped in bed on arrival, with OT present, and agreeable to session with continued progress towards acute goals. Pt able to complete bed mobility with mod assist for all aspects with pt having poor initiation with verbal prompting. Pt with improved alertness once EOB however pt continuing to close eyes intermittently throughout session, in sitting and standing, needing prompts to remain alert. Pt with continued progress with ambulation in hallway with min assist +2 for chair follow for safety. Pt up in chair at end of session with family present. Current plan remains appropriate to address deficits and maximize functional independence and decrease caregiver burden. Pt continues to benefit from skilled PT services to progress toward functional mobility goals.     Recommendations for follow up therapy are one component of a multi-disciplinary discharge planning process, led by the attending physician.  Recommendations may be updated based on patient status, additional functional criteria and insurance authorization.  Follow Up Recommendations  Skilled nursing-short term rehab (<3 hours/day) Can patient physically be transported by private vehicle:  Yes   Assistance Recommended at Discharge Frequent or constant Supervision/Assistance  Patient can return home with the following Help with stairs or ramp for entrance;Assistance with cooking/housework;A little help with walking and/or transfers;A little help with bathing/dressing/bathroom;Assist for transportation   Equipment Recommendations  Rolling walker (2 wheels)    Recommendations for Other Services       Precautions / Restrictions Precautions Precautions: Fall;Other (comment) Precaution Comments: seizure and aspiration, mitts Restrictions Weight Bearing Restrictions: No     Mobility  Bed Mobility Overal bed mobility: Needs Assistance Bed Mobility: Supine to Sit     Supine to sit: Mod assist     General bed mobility comments: mod assist to manage LEs and eleavte trunk, pt with poor inititaion    Transfers Overall transfer level: Needs assistance Equipment used: Rolling walker (2 wheels) Transfers: Sit to/from Stand Sit to Stand: Min assist, +2 safety/equipment           General transfer comment: stood from EOB and reclienr with RW light min A to facilitate anterior weight shift    Ambulation/Gait Ambulation/Gait assistance: Min assist, +2 safety/equipment (chair follow) Gait Distance (Feet): 55 Feet (x 2) Assistive device: Rolling walker (2 wheels) Gait Pattern/deviations: Trunk flexed, Narrow base of support, Shuffle, Decreased stride length, Decreased step length - right, Decreased step length - left Gait velocity: decreased     General Gait Details: required increased cues and assist to steer RW when turning, seated rest during between bouts, intermittent standing recovery 2/2 coughing   Stairs             Wheelchair Mobility    Modified Rankin (Stroke Patients Only) Modified Rankin (Stroke Patients Only) Pre-Morbid Rankin Score: Slight disability Modified Rankin: Severe disability     Balance Overall balance assessment: Needs  assistance Sitting-balance support: Bilateral upper extremity supported Sitting  balance-Leahy Scale: Fair Sitting balance - Comments: able to maintain static and dynamic sitting balance with close supervision   Standing balance support: Bilateral upper extremity supported, During functional activity (RW) Standing balance-Leahy Scale: Poor Standing balance comment: required min guard for static standing balance and min A for dynamic standing balance                            Cognition Arousal/Alertness: Lethargic, Awake/alert Behavior During Therapy: WFL for tasks assessed/performed Overall Cognitive Status: History of cognitive impairments - at baseline                                 General Comments: Pt became more awake/alert once sitting EOB, however pt continues to close eyes intermittently during session needing prompts for alertness        Exercises      General Comments General comments (skin integrity, edema, etc.): son and wife present at end of session, pt with noted increase in deep secretions at start of session (resolving minimally with OOB), pt unable to cough to clear, RN notified      Pertinent Vitals/Pain Pain Assessment Pain Assessment: Faces Faces Pain Scale: Hurts a little bit Pain Location: general Pain Descriptors / Indicators: Grimacing Pain Intervention(s): Monitored during session, Limited activity within patient's tolerance, Repositioned    Home Living                          Prior Function            PT Goals (current goals can now be found in the care plan section) Acute Rehab PT Goals PT Goal Formulation: With family Time For Goal Achievement: 11/06/22 Progress towards PT goals: Progressing toward goals    Frequency    Min 3X/week      PT Plan Current plan remains appropriate    Co-evaluation              AM-PAC PT "6 Clicks" Mobility   Outcome Measure  Help needed turning from your  back to your side while in a flat bed without using bedrails?: A Little Help needed moving from lying on your back to sitting on the side of a flat bed without using bedrails?: A Lot Help needed moving to and from a bed to a chair (including a wheelchair)?: A Little Help needed standing up from a chair using your arms (e.g., wheelchair or bedside chair)?: A Little Help needed to walk in hospital room?: A Lot Help needed climbing 3-5 steps with a railing? : A Lot 6 Click Score: 15    End of Session Equipment Utilized During Treatment: Gait belt Activity Tolerance: Patient tolerated treatment well Patient left: in chair;with call bell/phone within reach;with chair alarm set;with family/visitor present Nurse Communication: Mobility status PT Visit Diagnosis: Other abnormalities of gait and mobility (R26.89);Muscle weakness (generalized) (M62.81)     Time: 1610-9604 PT Time Calculation (min) (ACUTE ONLY): 27 min  Charges:  $Gait Training: 8-22 mins                     Eliz Nigg R. PTA Acute Rehabilitation Services Office: [place] 10/29/2022, 10:22 AM

## 2022-10-29 NOTE — Plan of Care (Signed)
  Problem: Activity: Goal: Ability to tolerate increased activity will improve Outcome: Progressing   Problem: Clinical Measurements: Goal: Ability to maintain a body temperature in the normal range will improve Outcome: Progressing   Problem: Respiratory: Goal: Ability to maintain adequate ventilation will improve Outcome: Progressing Goal: Ability to maintain a clear airway will improve Outcome: Progressing   Problem: Clinical Measurements: Goal: Will remain free from infection Outcome: Progressing Goal: Diagnostic test results will improve Outcome: Progressing Goal: Respiratory complications will improve Outcome: Progressing

## 2022-10-30 DIAGNOSIS — I639 Cerebral infarction, unspecified: Secondary | ICD-10-CM | POA: Diagnosis not present

## 2022-10-30 LAB — GLUCOSE, CAPILLARY
Glucose-Capillary: 125 mg/dL — ABNORMAL HIGH (ref 70–99)
Glucose-Capillary: 135 mg/dL — ABNORMAL HIGH (ref 70–99)
Glucose-Capillary: 145 mg/dL — ABNORMAL HIGH (ref 70–99)
Glucose-Capillary: 147 mg/dL — ABNORMAL HIGH (ref 70–99)
Glucose-Capillary: 161 mg/dL — ABNORMAL HIGH (ref 70–99)
Glucose-Capillary: 172 mg/dL — ABNORMAL HIGH (ref 70–99)

## 2022-10-30 MED ORDER — GLYCOPYRROLATE 0.2 MG/ML IJ SOLN
0.2000 mg | Freq: Once | INTRAMUSCULAR | Status: AC | PRN
  Administered 2022-10-30: 0.2 mg via SUBCUTANEOUS
  Filled 2022-10-30: qty 1

## 2022-10-30 NOTE — Progress Notes (Signed)
PROGRESS NOTE    Ryan Savage  GNF:[id number] DOB: 08/16/1929 DOA: 10/21/2022 PCP: Lajean Manes, MD    Brief Narrative:   Ryan Savage is a 88 y.o. 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  Last known normal was 2130 the night prior.  Per wife, he was unable to communicate which is not his typical baseline.  Code stroke was initiated on ED arrival with neurology consultation.  Patient underwent CT head with old left PCA infarct and multiple old small vessel infarcts in the cerebellum, CTA head/neck with no emergent LVO or high-grade stenosis.  MRI with multiple acute cortically based infarct within the right MCA territory and right MCA/PCA watershed territory.  TRH was consulted for admission for further evaluation management of acute CVA.  Assessment & Plan:   Acute metabolic encephalopathy Acute CVA Patient resenting to the ED with dysarthria, left-sided weakness, facial droop.  Imaging notable for multiple acute cortically based infarct within the right MCA territory and right MCA/PCA watershed territory.  Patient was outside the window for intervention.  Etiology likely embolic in origin given A-fib and inability to anticoagulate due to previous SDH and high fall risk.  TTE with LVEF of 5 to 60%.  LDL 50.  Hemoglobin A1c 5.9.  Neurology now signed off with recommendation of outpatient follow-up 4 weeks. -- Simvastatin 10 mg daily -- Aspirin 325 mg daily -- Continue PT/OT efforts while inpatient -- Palliative care consulted for poor prognosis, awaiting family discussion, anticipate likely will transition to  home hospice  Dysphagia Hypovolemic hyponatremia Etiology likely secondary to acute CVA as above.  Core track placed on 10/23/2022, currently on tube feeds.  Patient's living will indicates he would not want a PEG tube. -- SLP following -- Continue tube feeds for now -- Palliative care consulted for assistance with goals of care and medical decision making, anticipate likely transition to home hospice soon  Questionable commune acquired pneumonia Completed course of antibiotics with azithromycin and ceftriaxone inpatient.  Chronic atrial fibrillation Essential HTN Not on anticoagulation outpatient due to history of frequent falls, SDH s/p bilateral hematoma evacuation.  Has refused Watchman device in the past. -- Metoprolol tartrate 25 mg twice daily  [name], severe two-vessel disease -- Continue beta-blocker, statin, aspirin  Chronic systolic congestive heart failure, compensated Remains euvolemic, holding diuretics.  TTE with improved LVEF to 55-60%, no LV regional wall motion abnormalities, severe biatrial enlargement, moderate TR, mild MR. -- Strict I's and O's, daily weights  Pulmonary hypertension, sleep apnea Not currently on CPAP.  Aortic root and ascending dilation Routine surveillance  Hyperlipidemia LDL 50.  Continue simvastatin 10 mg daily  Delirium --Delirium precautions --Get up during the day --Encourage a familiar face to remain present throughout the day --Keep blinds open and lights on during daylight hours --Minimize the use of opioids/benzodiazepines --Seroquel 100 Thomson nightly  Goals of care: Once again discussed with patient's son at bedside.  Patient has a living will that indicates she would not want to be artificially kept alive via permanent feeding tube.  Given patient's overall condition with recurrent CVA; patient unlikely to make any achievable recovery that would be consistent with good quality of life.  Patient's  son would like to talk with  palliative care before making further decisions but okay also with talking with hospice agencies as well.  Anticipate likely transitioning to hospice at home over the next few days.   DVT prophylaxis: heparin injection 5,000 Units Start: 10/22/22 2200 SCDs Start: 10/22/22 0810    Code Status: DNR Family Communication: Updated son present at bedside this morning  Disposition Plan:  Level of care: Progressive Status is: Inpatient Remains inpatient appropriate because:     Consultants:  Neurology Palliative care  Procedures:  TTE  Antimicrobials:  Azithromycin 12/11 - 12/14 Ceftriaxone 12/11 - 12/15   Subjective: Patient seen and examined at bedside, resting comfortably.  Remains confused, lying in bed.  Son present.  No significant change.  Son going to bring his mother to the hospital and awaiting palliative care consultation.  Discussed with son overall poor prognosis and anticipate likely discharging home under home hospice.  Patient's son states okay talking with hospice agency but would also like to talk with palliative care before any further decisions made.  Patient continue to utilize suction device but unable to obtain any further ROS due to this mental status.  No acute concerns overnight per nursing staff.  Objective: Vitals:   10/29/22 2111 10/30/22 0422 10/30/22 0928 10/30/22 1245  BP: (!) 115/51 135/85 (!) 154/94 120/84  Pulse:   86 73  Resp: '20 20 18 19  '$ Temp: 98.1 F (36.7 C) 98.2 F (36.8 C) (!) 96.9 F (36.1 C) (!) 96.4 F (35.8 C)  TempSrc: Axillary Axillary Axillary Axillary  SpO2: 95% 92% 93% 97%  Weight:      Height:        Intake/Output Summary (Last 24 hours) at 10/30/2022 1328 Last data filed at 10/29/2022 2005 Gross per 24 hour  Intake 50 ml  Output 500 ml  Net -450 ml   Filed Weights   10/26/22 0500 10/28/22 0500 10/29/22 0600  Weight: 80.3 kg 82.1 kg 81.3 kg    Examination:  Physical Exam: GEN: NAD, alert; chronically ill in  appearance, mild agitation HEENT: NCAT, PERRL, EOMI, sclera clear, MMM PULM: CTAB w/o wheezes/crackles, normal respiratory effort, on room air CV: RRR w/o M/G/R GI: abd soft, NTND, NABS, no R/G/M MSK: no peripheral edema, moves all Maggie Font independently \   Data Reviewed: I have personally reviewed following labs and imaging studies  CBC: No results for input(s): "WBC", "NEUTROABS", "HGB", "HCT", "MCV", "PLT" in the last 168 hours. Basic Metabolic Panel: Recent Labs  Lab 10/25/22 0423 10/26/22 0320 10/26/22 2345 10/27/22 0357 10/28/22 0404 10/29/22 0405  NA 127* 130* 129* 132* 131* 132*  K 3.6 3.7 4.1 4.1 4.5 4.3  CL 98 99 98 100 100 99  CO2 20* 22 21* '24 22 22  '$ GLUCOSE 145* 144* 143* 133* 130* 139*  BUN '10 13 18 18 22 23  '$ CREATININE 0.72 0.72 0.71 0.71 0.68 0.68  CALCIUM 7.9* 8.4* 8.4* 8.6* 8.7* 8.9  MG 2.0 1.8 1.8 1.8 1.9 1.8  PHOS 2.4* 2.4*  --  3.3 3.5 4.1   GFR: Estimated Creatinine Clearance: 60.1 mL/min (by C-G formula based on SCr of 0.68 mg/dL). Liver Function Tests: No results for input(s): "AST", "ALT", "ALKPHOS", "BILITOT", "PROT", "ALBUMIN" in the last 168 hours. No results for input(s): "LIPASE", "AMYLASE" in the last 168 hours. No results for input(s): "AMMONIA" in the last 168 hours. Coagulation Profile: No results for input(s): "INR", "PROTIME" in the last 168 hours. Cardiac Enzymes: No results for input(s): "CKTOTAL", "  CKMB", "CKMBINDEX", "TROPONINI" in the last 168 hours. BNP (last 3 results) No results for input(s): "PROBNP" in the last 8760 hours. HbA1C: No results for input(s): "HGBA1C" in the last 72 hours. CBG: Recent Labs  Lab 10/29/22 2002 10/30/22 0034 10/30/22 0431 10/30/22 0926 10/30/22 1242  GLUCAP 127* 147* 135* 172* 161*   Lipid Profile: No results for input(s): "CHOL", "HDL", "LDLCALC", "TRIG", "CHOLHDL", "LDLDIRECT" in the last 72 hours. Thyroid Function Tests: No results for input(s): "TSH", "T4TOTAL", "FREET4", "T3FREE",  "THYROIDAB" in the last 72 hours. Anemia Panel: No results for input(s): "VITAMINB12", "FOLATE", "FERRITIN", "TIBC", "IRON", "RETICCTPCT" in the last 72 hours. Sepsis Labs: No results for input(s): "PROCALCITON", "LATICACIDVEN" in the last 168 hours.  Recent Results (from the past 240 hour(s))  Urine Culture     Status: None   Collection Time: 10/22/22  2:10 AM   Specimen: Urine, Clean Catch  Result Value Ref Range Status   Specimen Description URINE, CLEAN CATCH  Final   Special Requests NONE  Final   Culture   Final    NO GROWTH Performed at [facility], [address] [place], [place] [address]    Report Status 10/23/2022 FINAL  Final  Resp panel by RT-PCR (RSV, Flu A&B, Covid) Anterior Nasal Swab     Status: None   Collection Time: 10/22/22  2:19 AM   Specimen: Anterior Nasal Swab  Result Value Ref Range Status   SARS Coronavirus 2 by RT PCR NEGATIVE NEGATIVE Final    Comment: (NOTE) SARS-CoV-2 target nucleic acids are NOT DETECTED.  The SARS-CoV-2 RNA is generally detectable in upper respiratory specimens during the acute phase of infection. The lowest concentration of SARS-CoV-2 viral copies this assay can detect is 138 copies/mL. A negative result does not preclude SARS-Cov-2 infection and should not be used as the sole basis for treatment or other patient management decisions. A negative result may occur with  improper specimen collection/handling, submission of specimen other than nasopharyngeal swab, presence of viral mutation(s) within the areas targeted by this assay, and inadequate number of viral copies(<138 copies/mL). A negative result must be combined with clinical observations, patient history, and epidemiological information. The expected result is Negative.  Fact Sheet for Patients:  EntrepreneurPulse.com.au  Fact Sheet for Healthcare Providers:  IncredibleEmployment.be  This test is no t yet approved or  cleared by the [place] FDA and  has been authorized for detection and/or diagnosis of SARS-CoV-2 by FDA under an Emergency Use Authorization (EUA). This EUA will remain  in effect (meaning this test can be used) for the duration of the COVID-19 declaration under Section 564(b)(1) of the Act, 21 U.S.C.section 360bbb-3(b)(1), unless the authorization is terminated  or revoked sooner.       Influenza A by PCR NEGATIVE NEGATIVE Final   Influenza B by PCR NEGATIVE NEGATIVE Final    Comment: (NOTE) The Xpert Xpress SARS-CoV-2/FLU/RSV plus assay is intended as an aid in the diagnosis of influenza from Nasopharyngeal swab specimens and should not be used as a sole basis for treatment. Nasal washings and aspirates are unacceptable for Xpert Xpress SARS-CoV-2/FLU/RSV testing.  Fact Sheet for Patients: EntrepreneurPulse.com.au  Fact Sheet for Healthcare Providers: IncredibleEmployment.be  This test is not yet approved or cleared by the [place] FDA and has been authorized for detection and/or diagnosis of SARS-CoV-2 by FDA under an Emergency Use Authorization (EUA). This EUA will remain in effect (meaning this test can be used) for the duration of the COVID-19 declaration under  Section 564(b)(1) of the Act, 21 U.S.C. section 360bbb-3(b)(1), unless the authorization is terminated or revoked.     Resp Syncytial Virus by PCR NEGATIVE NEGATIVE Final    Comment: (NOTE) Fact Sheet for Patients: EntrepreneurPulse.com.au  Fact Sheet for Healthcare Providers: IncredibleEmployment.be  This test is not yet approved or cleared by the [place] FDA and has been authorized for detection and/or diagnosis of SARS-CoV-2 by FDA under an Emergency Use Authorization (EUA). This EUA will remain in effect (meaning this test can be used) for the duration of the COVID-19 declaration under Section 564(b)(1) of the Act, 21  U.S.C. section 360bbb-3(b)(1), unless the authorization is terminated or revoked.  Performed at [facility], [place] [address], [place], [place] [address]   Blood Culture (routine x 2)     Status: None   Collection Time: 10/22/22  3:00 AM   Specimen: BLOOD  Result Value Ref Range Status   Specimen Description BLOOD SITE NOT SPECIFIED  Final   Special Requests   Final    BOTTLES DRAWN AEROBIC AND ANAEROBIC Blood Culture results may not be optimal due to an inadequate volume of blood received in culture bottles   Culture   Final    NO GROWTH 5 DAYS Performed at [facility], [place] [address] [place], [place] [address]    Report Status 10/27/2022 FINAL  Final  Blood Culture (routine x 2)     Status: None   Collection Time: 10/22/22  6:33 AM   Specimen: BLOOD  Result Value Ref Range Status   Specimen Description BLOOD SITE NOT SPECIFIED  Final   Special Requests   Final    BOTTLES DRAWN AEROBIC AND ANAEROBIC Blood Culture adequate volume   Culture   Final    NO GROWTH 5 DAYS Performed at [facility], [place] [address], [place], [place] [address]    Report Status 10/27/2022 FINAL  Final         Radiology Studies: No results found.      Scheduled Meds:  aspirin  325 mg Per Tube Daily   free water  50 mL Per Tube Q6H   heparin injection (subcutaneous)  5,000 Units Subcutaneous Q8H   insulin aspart  0-6 Units Subcutaneous Q4H   metoprolol tartrate  25 mg Per NG tube BID   polyethylene glycol  17 g Per Tube BID   QUEtiapine  100 mg Oral QHS   simvastatin  10 mg Per NG tube QPM   Continuous Infusions:  feeding supplement (OSMOLITE 1.2 CAL) 1,000 mL (10/29/22 2036)     LOS: 8 days    Time spent: 52 minutes spent on chart review, discussion with nursing staff, consultants, updating family and interview/physical exam; more than 50% of that time was spent in counseling and/or coordination of care.    Xzaria Teo J [place] DO [facility] Available via  Epic secure chat 7am-7pm After these hours, please refer to coverage provider listed on [email or web address] 10/30/2022, 1:28 PM

## 2022-10-30 NOTE — TOC Progression Note (Signed)
Transition of Care [facility]) - Progression Note    Patient Details  Name: Ryan Savage MRN: [id number] Date of Birth: [birth year]  Transition of Care [facility]) CM/SW Contact  Pollie Friar, RN Phone Number: 10/30/2022, 3:10 PM  Clinical Narrative:    Pts family has decided to dc home with hospice services. CM met with the patient and his son. Son provided choice for hospice services. He selected [facility]. CM has called referral into [facility].  Pt will need hospital bed/ suction and over the bed table. CM has updated [facility] on needed DME.  The hospice agency will reach out to the son, [name] once approved and arrange for delivery of DME to the home.  PTAR will be needed for transport once DME delivered.  Plan is for home tomorrow, TOC following.   Planned Disposition: Home Hospice Barriers to Discharge: Continued Medical Work up  Expected Discharge Plan and Services In-house Referral: Clinical Social Work Discharge Planning Services: CM Consult Post Acute Care Choice: Hospice Living arrangements for the past 2 months: Single Family Home                                       Social Determinants of Health (SDOH) Interventions SDOH Screenings   Food Insecurity: No Food Insecurity (10/22/2022)  Housing: Low Risk  (10/22/2022)  Transportation Needs: No Transportation Needs (10/22/2022)  Utilities: Not At Risk (10/22/2022)  Alcohol Screen: Low Risk  (03/25/2022)  Depression (PHQ2-9): Low Risk  (03/25/2022)  Financial Resource Strain: Low Risk  (03/25/2022)  Stress: No Stress Concern Present (03/25/2022)  Tobacco Use: Medium Risk (10/22/2022)    Readmission Risk Interventions     No data to display

## 2022-10-31 ENCOUNTER — Other Ambulatory Visit (HOSPITAL_COMMUNITY): Payer: Self-pay

## 2022-10-31 DIAGNOSIS — R52 Pain, unspecified: Secondary | ICD-10-CM | POA: Diagnosis not present

## 2022-10-31 DIAGNOSIS — R0609 Other forms of dyspnea: Secondary | ICD-10-CM | POA: Diagnosis not present

## 2022-10-31 DIAGNOSIS — I639 Cerebral infarction, unspecified: Secondary | ICD-10-CM | POA: Diagnosis not present

## 2022-10-31 DIAGNOSIS — Z515 Encounter for palliative care: Secondary | ICD-10-CM

## 2022-10-31 LAB — GLUCOSE, CAPILLARY
Glucose-Capillary: 125 mg/dL — ABNORMAL HIGH (ref 70–99)
Glucose-Capillary: 150 mg/dL — ABNORMAL HIGH (ref 70–99)
Glucose-Capillary: 175 mg/dL — ABNORMAL HIGH (ref 70–99)

## 2022-10-31 MED ORDER — LORAZEPAM 1 MG PO TABS
1.0000 mg | ORAL_TABLET | ORAL | Status: DC | PRN
  Administered 2022-10-31: 1 mg via SUBLINGUAL
  Filled 2022-10-31: qty 1

## 2022-10-31 MED ORDER — MORPHINE SULFATE (CONCENTRATE) 10 MG/0.5ML PO SOLN: 5.0000 mg | ORAL | Status: DC | PRN

## 2022-10-31 MED ORDER — HYOSCYAMINE SULFATE 0.125 MG SL SUBL
0.1250 mg | SUBLINGUAL_TABLET | SUBLINGUAL | 0 refills | Status: AC | PRN
  Filled 2022-10-31: qty 42, 7d supply, fill #0

## 2022-10-31 MED ORDER — LORAZEPAM 1 MG PO TABS
1.0000 mg | ORAL_TABLET | ORAL | 0 refills | Status: AC | PRN
  Filled 2022-10-31: qty 30, 9d supply, fill #0

## 2022-10-31 MED ORDER — MORPHINE SULFATE (CONCENTRATE) 10 MG/0.5ML PO SOLN
5.0000 mg | ORAL | 0 refills | Status: AC | PRN
  Filled 2022-10-31: qty 30, 15d supply, fill #0

## 2022-10-31 NOTE — TOC Transition Note (Signed)
Transition of Care [facility]) - CM/SW Discharge Note   Patient Details  Name: Thorne Wirz MRN: [id number] Date of Birth: 1928-12-09  Transition of Care [facility]) CM/SW Contact:  Verdell Carmine, RN Phone Number: 10/31/2022, 11:38 AM   Clinical Narrative:     Discussed with CSW. All equipment has been delivered to the home. Son requests palliative care consult. Palliative team notified, they will see patient ASAP. After completion will follow up with team and DC to home with PTAR. Paperwork placed iin envelope face sheet, Medical Necessity, and will obtain AVS to place in DC folder.   Final next level of care: Home w Hospice Care Barriers to Discharge: No Barriers Identified (awaiting palliative referral)   Patient Goals and CMS Choice   CMS Medicare.gov Compare Post Acute Care list provided to:: Patient Represenative (must comment) Choice offered to / list presented to : Adult Children    Discharge Placement                       Discharge Plan and Services In-house Referral: Clinical Social Work Discharge Planning Services: CM Consult Post Acute Care Choice: Hospice                               Social Determinants of Health (SDOH) Interventions     Readmission Risk Interventions     No data to display

## 2022-10-31 NOTE — Progress Notes (Signed)
Discharged to home after Lorazepam given for anxiety.  Tranported by Corey Harold.  Son accompanied.  Discharge instructions completed with virtual RN and she answered all questions.  Son was very happy with the discharge process and the way the virtual RN took the time to go over everything.

## 2022-10-31 NOTE — Discharge Summary (Signed)
Physician Discharge Summary  Augustine Perusse NOM:[id number] DOB: 1929-11-05 DOA: 10/21/2022  PCP: Lajean Manes, MD  Admit date: 10/21/2022 Discharge date: 10/31/2022  Admitted From: Home Disposition: Home with hospice of the [facility]  Recommendations for Outpatient Follow-up:  Follow up with hospice of the [facility] on discharge  Equipment/Devices: Hospital bed, suction, over the bed table  Discharge Condition: Guarded, overall prognosis poor discharging on hospice CODE STATUS: DNR Diet recommendation: Comfort feeds as tolerates  History of present illness:  Deforrest Brusca is a 88 y.o. 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  Last known normal was 2130 the night prior.  Per wife, he was unable to communicate which is not his typical baseline.  Code stroke was initiated on ED arrival with neurology consultation.  Patient underwent CT head with old left PCA infarct and multiple old small vessel infarcts in the cerebellum, CTA head/neck with no emergent LVO or high-grade stenosis.  MRI with multiple acute cortically based infarct within the right MCA territory and right MCA/PCA watershed territory.  TRH was consulted for admission for further evaluation management of acute CVA.   Hospital course:  Acute metabolic encephalopathy Acute CVA Patient resenting to the ED with dysarthria, left-sided weakness, facial droop.  Imaging notable for multiple acute cortically based infarct within the right MCA territory and right MCA/PCA watershed territory.  Patient was outside the window for intervention.  Etiology likely embolic  in origin given A-fib and inability to anticoagulate due to previous SDH and high fall risk.  TTE with LVEF of 5 to 60%.  LDL 50.  Hemoglobin A1c 5.9.  Neurology now signed off with recommendation of outpatient follow-up 4 weeks.  Given patient's continued decline requiring tube feeds, family has decided to transition to home hospice.  Statin and aspirin have been discontinued.  Ultimately very poor prognosis.  Discharging home with home hospice service with [facility] of the [place].  Supportive care/treatment with oral morphine, Ativan, Levsin.   Dysphagia Hypovolemic hyponatremia Etiology likely secondary to acute CVA as above.  Core track placed on 10/23/2022, currently on tube feeds.  Patient's living will indicates he would not want a PEG tube.  Family now deciding to transition to home hospice.  Will discontinue core track tube on discharge.  Continue comfort feeds as tolerates.   Questionable commune acquired pneumonia Completed course of antibiotics with azithromycin and ceftriaxone inpatient.   Chronic atrial fibrillation Essential HTN Not on anticoagulation outpatient due to history of frequent falls, SDH s/p bilateral hematoma evacuation.  Has refused Watchman device in the past.  Discontinue home metoprolol.   [name], severe two-vessel disease Discontinue beta-blocker, statin, aspirin now transitioning to home hospice.   Chronic systolic congestive heart failure, compensated Remains euvolemic, holding diuretics.  TTE with improved LVEF to 55-60%, no LV regional wall motion abnormalities, severe biatrial enlargement, moderate TR, mild MR.   Pulmonary hypertension, sleep apnea Not currently on CPAP.   Aortic root and ascending dilation Routine surveillance   Hyperlipidemia LDL 50.  Discontinue statin as above   Goals of care: Once again discussed with patient's son at bedside.  Patient has a living will that indicates she would not want to be artificially kept  alive via permanent  feeding tube.  Given patient's overall condition with recurrent CVA; patient unlikely to make any achievable recovery that would be consistent with good quality of life.  Patient's son has agreed that patient would not want to continue living in this condition and agreeable to transition to home hospice.  [facility] [place] consulted and will discharge with home hospice services today.    Discharge Diagnoses:  Principal Problem:   Cerebral embolism with cerebral infarction Active Problems:   Atrial fibrillation, chronic (HCC)   Elevated troponin   Acute metabolic encephalopathy   Hospice care patient    Discharge Instructions  Discharge Instructions     Ambulatory referral to Neurology   Complete by: As directed    Follow up with stroke clinic NP Venancio Poisson at [facility] in about 4 weeks. Thanks.   Diet - low sodium heart healthy   Complete by: As directed    Increase activity slowly   Complete by: As directed       Allergies as of 10/31/2022       Reactions   Methylphenidate Derivatives Other (See Comments)   Restless leg   Warfarin Sodium    Other reaction(s): subdural   Novocain [procaine] Rash        Medication List     STOP taking these medications    acetaminophen 325 MG tablet Commonly known as: TYLENOL   amLODipine 2.5 MG tablet Commonly known as: NORVASC   aspirin EC 81 MG tablet   finasteride 5 MG tablet Commonly known as: PROSCAR   melatonin 3 MG Tabs tablet   metoprolol succinate 25 MG 24 hr tablet Commonly known as: TOPROL-XL   multivitamin with minerals Tabs tablet   PRESERVISION AREDS 2 PO   simvastatin 10 MG tablet Commonly known as: ZOCOR   Vitamin D3 50 MCG (2000 UT) Tabs       TAKE these medications    hyoscyamine 0.125 MG SL tablet Commonly known as: LEVSIN SL Place 1 tablet (0.125 mg total) under the tongue every 4 (four) hours as needed (excess oral secretions).   LORazepam 1 MG tablet Commonly known as:  ATIVAN Place 1 tablet (1 mg total) under the tongue every 4 (four) hours as needed for anxiety or sleep.   morphine CONCENTRATE 10 MG/0.5ML Soln concentrated solution Place 0.25 mLs (5 mg total) under the tongue every 3 (three) hours as needed for moderate pain or shortness of breath.        Follow-up Information     Frann Rider, NP. Schedule an appointment as soon as possible for a visit in 1 month(s).   Specialty: Neurology Why: stroke clinic Contact information: [address] [place] [address] [phone]         [facility] Follow up.   Contact information: [place] [id number] [phone]               Allergies  Allergen Reactions   Methylphenidate Derivatives Other (See Comments)    Restless leg   Warfarin Sodium     Other reaction(s): subdural   Novocain [Procaine] Rash    Consultations: Neurology Palliative care   Procedures/Studies: DG CHEST PORT 1 VIEW  Result Date: 10/27/2022 CLINICAL DATA:  [id number]. Tachypnea with right wrist pain and swelling. EXAM: PORTABLE CHEST 1 VIEW RIGHT WRIST 3 VIEWS COMPARISON:  Portable chest 10/22/2022. No prior right wrist series. FINDINGS: Portable chest single view: 11:43 p.m. There is new demonstration  of a Dobbhoff feeding tube. This passes well into the stomach but the radiopaque tip is not filmed. There is moderate severe cardiomegaly again noted. Central vascular prominence noted previously is no longer seen. Prior increased interstitial consolidation of the lung fields is no longer seen. Subpleural chronic interstitial changes in the bases are again noted. Interstitial and hazy opacities of the right upper and left lower lung fields are noted and could be due to pneumonitis or partially cleared edema. The rest of the lungs appear generally clear. There is no substantial pleural effusion. There is aortic tortuosity and calcification with stable mediastinum.  Osteopenia and degenerative change thoracic spine. Right wrist, AP lateral oblique: Moderate generalized soft tissue swelling. There are patchy calcifications in the radial and ulnar arteries which extend into the wrist and palmar arch. There is chondrocalcinosis in the wrist including of the TFCC. No soft tissue gas or radiopaque foreign body are seen. There is osteopenia without evidence of fractures or destructive lesions. Bone-on-bone first [place] joint space loss is noted with exuberant reactive osteophytes. Similar but less advanced degenerative arthrosis involving the index CMC joint. Other joints appear preserved. There is normal interosseous alignment. IMPRESSION: 1. Interstitial and hazy opacities of the right upper and left lower lung fields could be due to pneumonitis or partially cleared edema. 2. Chronic interstitial changes in the bases. 3. Stable cardiomegaly. The prior finding of generalized interstitial consolidation is not seen today. 4. New demonstration of a Dobbhoff feeding tube which passes well into the stomach but the radiopaque tip is not filmed. 5. Moderate generalized right wrist soft tissue swelling. 6. Osteopenia and degenerative change without evidence of fractures or destructive lesions. 7. Chondrocalcinosis. 8. Calcifications in the radial and ulnar arteries extending into the wrist and hand. Electronically Signed   By: Telford Nab M.D.   On: 10/27/2022 00:07   DG Wrist Complete Right  Result Date: 10/27/2022 CLINICAL DATA:  [id number]. Tachypnea with right wrist pain and swelling. EXAM: PORTABLE CHEST 1 VIEW RIGHT WRIST 3 VIEWS COMPARISON:  Portable chest 10/22/2022. No prior right wrist series. FINDINGS: Portable chest single view: 11:43 p.m. There is new demonstration of a Dobbhoff feeding tube. This passes well into the stomach but the radiopaque tip is not filmed. There is moderate severe cardiomegaly again noted. Central vascular prominence noted previously is no longer seen.  Prior increased interstitial consolidation of the lung fields is no longer seen. Subpleural chronic interstitial changes in the bases are again noted. Interstitial and hazy opacities of the right upper and left lower lung fields are noted and could be due to pneumonitis or partially cleared edema. The rest of the lungs appear generally clear. There is no substantial pleural effusion. There is aortic tortuosity and calcification with stable mediastinum. Osteopenia and degenerative change thoracic spine. Right wrist, AP lateral oblique: Moderate generalized soft tissue swelling. There are patchy calcifications in the radial and ulnar arteries which extend into the wrist and palmar arch. There is chondrocalcinosis in the wrist including of the TFCC. No soft tissue gas or radiopaque foreign body are seen. There is osteopenia without evidence of fractures or destructive lesions. Bone-on-bone first [place] joint space loss is noted with exuberant reactive osteophytes. Similar but less advanced degenerative arthrosis involving the index CMC joint. Other joints appear preserved. There is normal interosseous alignment. IMPRESSION: 1. Interstitial and hazy opacities of the right upper and left lower lung fields could be due to pneumonitis or partially cleared edema. 2. Chronic interstitial changes in the bases.  3. Stable cardiomegaly. The prior finding of generalized interstitial consolidation is not seen today. 4. New demonstration of a Dobbhoff feeding tube which passes well into the stomach but the radiopaque tip is not filmed. 5. Moderate generalized right wrist soft tissue swelling. 6. Osteopenia and degenerative change without evidence of fractures or destructive lesions. 7. Chondrocalcinosis. 8. Calcifications in the radial and ulnar arteries extending into the wrist and hand. Electronically Signed   By: Telford Nab M.D.   On: 10/27/2022 00:07   DG Abd Portable 1V  Result Date: 10/23/2022 CLINICAL DATA:  [id number]  Encounter for feeding tube placement [id number] EXAM: PORTABLE ABDOMEN - 1 VIEW COMPARISON:  Chest from previous day FINDINGS: Weighted tip feeding tube has been advanced to the gastric antrum. Stomach is decompressed. Visualized bowel gas pattern normal. Lower abdomen excluded. Coarse interstitial and airspace opacities in the visualized lung bases as before. Spondylitic changes in the thoracolumbar spine. IMPRESSION: Feeding tube advanced to the gastric antrum. Electronically Signed   By: Lucrezia Europe M.D.   On: 10/23/2022 15:03   ECHOCARDIOGRAM COMPLETE  Result Date: 10/22/2022    ECHOCARDIOGRAM REPORT   Patient Name:   TRINTON PREWITT Date of Exam: 10/22/2022 Medical Rec #:  [id number]       Height:       69.0 in Accession #:    [id number]      Weight:       173.3 lb Date of Birth:  October 06, 1929        BSA:          1.944 m Patient Age:    31 years        BP:           125/82 mmHg Patient Gender: M               HR:           62 bpm. Exam Location:  Inpatient Procedure: 2D Echo, Cardiac Doppler and Color Doppler Indications:    Stroke I63.9  History:        Patient has prior history of Echocardiogram examinations, most                 recent 07/24/2022. CHF, [name], Stroke, Mitral Valve Prolapse,                 Arrythmias:Atrial Fibrillation, Signs/Symptoms:Syncope; Risk                 Factors:Hypertension and Sleep Apnea. Pulmonary HTN.  Sonographer:    Ronny Flurry Referring Phys: [id number] [place] XU IMPRESSIONS  1. Left ventricular ejection fraction, by estimation, is 55 to 60%. The left ventricle has normal function. The left ventricle has no regional wall motion abnormalities. There is moderate left ventricular hypertrophy. Left ventricular diastolic parameters are indeterminate.  2. Right ventricular systolic function is normal. The right ventricular size is normal. There is moderately elevated pulmonary artery systolic pressure. The estimated right ventricular systolic pressure is 78.2 mmHg.  3. Left atrial  size was severely dilated.  4. Right atrial size was severely dilated.  5. The mitral valve is degenerative. Mild mitral valve regurgitation.  6. Tricuspid valve regurgitation is moderate.  7. 2D AVA 1.71 cm2, DVI 0.32 with normal LV stroke volume. At least mild PLFLGAS. The aortic valve is tricuspid. There is mild calcification of the aortic valve. There is mild thickening of the aortic valve. Aortic valve regurgitation is not visualized.  Mild aortic valve stenosis. Aortic valve mean gradient measures  13.0 mmHg.  8. The inferior vena cava is dilated in size with <50% respiratory variability, suggesting right atrial pressure of 15 mmHg. Comparison(s): Improved assessment of LV SV index, Similar AS gradients. RVSP is higher than prior. FINDINGS  Left Ventricle: Left ventricular ejection fraction, by estimation, is 55 to 60%. The left ventricle has normal function. The left ventricle has no regional wall motion abnormalities. The left ventricular internal cavity size was normal in size. There is  moderate left ventricular hypertrophy. Left ventricular diastolic parameters are indeterminate. Right Ventricle: The right ventricular size is normal. No increase in right ventricular wall thickness. Right ventricular systolic function is normal. There is moderately elevated pulmonary artery systolic pressure. The tricuspid regurgitant velocity is 2.94 m/s, and with an assumed right atrial pressure of 15 mmHg, the estimated right ventricular systolic pressure is 09.8 mmHg. Left Atrium: Left atrial size was severely dilated. Right Atrium: Right atrial size was severely dilated. Pericardium: There is no evidence of pericardial effusion. Mitral Valve: The mitral valve is degenerative in appearance. Mild mitral valve regurgitation. Tricuspid Valve: The tricuspid valve is normal in structure. Tricuspid valve regurgitation is moderate . No evidence of tricuspid stenosis. Aortic Valve: 2D AVA 1.71 cm2, DVI 0.32 with normal LV  stroke volume. At least mild PLFLGAS. The aortic valve is tricuspid. There is mild calcification of the aortic valve. There is mild thickening of the aortic valve. Aortic valve regurgitation is not visualized. Mild aortic stenosis is present. Aortic valve mean gradient measures 13.0 mmHg. Aortic valve peak gradient measures 23.1 mmHg. Aortic valve area, by VTI measures 1.32 cm. Pulmonic Valve: The pulmonic valve was normal in structure. Pulmonic valve regurgitation is mild. No evidence of pulmonic stenosis. Aorta: The aortic root and ascending aorta are structurally normal, with no evidence of dilitation. Venous: The inferior vena cava is dilated in size with less than 50% respiratory variability, suggesting right atrial pressure of 15 mmHg. IAS/Shunts: No atrial level shunt detected by color flow Doppler.  LEFT VENTRICLE PLAX 2D LVIDd:         5.00 cm   Diastology LVIDs:         3.60 cm   LV e' medial:  11.20 cm/s LV PW:         1.40 cm   LV e' lateral: 12.30 cm/s LV IVS:        1.30 cm LVOT diam:     2.30 cm LV SV:         72 LV SV Index:   37 LVOT Area:     4.15 cm  RIGHT VENTRICLE             IVC RV Basal diam:  5.00 cm     IVC diam: 2.70 cm RV S prime:     10.30 cm/s TAPSE (M-mode): 2.0 cm LEFT ATRIUM              Index        RIGHT ATRIUM           Index LA diam:        6.40 cm  3.29 cm/m   RA Area:     39.50 cm LA Vol (A2C):   106.0 ml 54.53 ml/m  RA Volume:   146.00 ml 75.11 ml/m LA Vol (A4C):   96.3 ml  49.54 ml/m LA Biplane Vol: 103.0 ml 52.99 ml/m  AORTIC VALVE AV Area (Vmax):    1.46 cm AV Area (Vmean):   1.33 cm AV Area (VTI):  1.32 cm AV Vmax:           240.50 cm/s AV Vmean:          171.000 cm/s AV VTI:            0.546 m AV Peak Grad:      23.1 mmHg AV Mean Grad:      13.0 mmHg LVOT Vmax:         84.40 cm/s LVOT Vmean:        54.900 cm/s LVOT VTI:          0.173 m LVOT/AV VTI ratio: 0.32  AORTA Ao Root diam: 3.20 cm Ao Asc diam:  3.40 cm TRICUSPID VALVE TR Peak grad:   34.6 mmHg TR  Vmax:        294.00 cm/s  SHUNTS Systemic VTI:  0.17 m Systemic Diam: 2.30 cm Rudean Haskell MD Electronically signed by Rudean Haskell MD Signature Date/Time: 10/22/2022/4:32:16 PM    Final    EEG adult  Result Date: 10/22/2022 Derek Jack, MD     10/22/2022  1:31 PM Routine EEG Report Rashi Docken is a 88 y.o. male with a history of altered mental status who is undergoing an EEG to evaluate for seizures. Report: This EEG was acquired with electrodes placed according to the International 10-20 electrode system (including Fp1, Fp2, F3, F4, C3, C4, P3, P4, O1, O2, T3, T4, T5, T6, A1, A2, Fz, Cz, Pz). The following electrodes were missing or displaced: none. The occipital dominant rhythm was 7 Hz. This activity is reactive to stimulation. Drowsiness was manifested by background fragmentation; deeper stages of sleep were not identified. There was focal slowing over the right hemisphere. There were no interictal epileptiform discharges. There were no electrographic seizures identified. Photic stimulation and hyperventilation were not performed. Impression and clinical correlation: This EEG was obtained while awake and drowsy and is abnormal due to: -  mild diffuse slowing indicative of global cerebral dysfunction - focal slowing over the right hemisphere indicating focal cerebral dysfunction in the region of his known R MCA stroke Epileptiform abnormalities were not seen during this recording. Su Monks, MD [facility] [phone] If 7pm- 7am, please page neurology on call as listed in [place].   MR BRAIN WO CONTRAST  Result Date: 10/22/2022 CLINICAL DATA:  Provided history: Stroke suspected. EXAM: MRI HEAD WITHOUT CONTRAST TECHNIQUE: Multiplanar, multiecho pulse sequences of the brain and surrounding structures were obtained without intravenous contrast. COMPARISON:  Non-contrast head CT and CT angiogram head/neck 10/22/2022. Brain MRI 07/17/2021. FINDINGS: Brain: Moderate  generalized cerebral atrophy. Multiple acute cortically-based infarcts within the mid-to-posterior right frontal lobe and right frontal operculum, right parietal lobe, right temporal lobe and lateral right occipital lobe (right MCA territory and right MCA/PCA watershed territory). The largest infarct within the posterior right frontal lobe measures 4.7 cm and involves the precentral gyrus. Unchanged chronic cortical/subcortical infarcts within the left insula, left frontal lobe, left parietal lobe, left occipital lobe and posteromedial left temporal lobe. Chronic blood products associated with the chronic left PCA territory infarct. Chronic lacunar infarct within the right basal ganglia/internal capsule. Background mild multifocal T2 FLAIR hyperintense signal abnormality within the cerebral white matter, nonspecific but compatible with chronic small vessel ischemic disease. Unchanged small chronic infarcts within the bilateral cerebellar hemispheres. Unchanged chronic hemorrhage within the right cerebellar hemisphere. No evidence of an intracranial mass. No extra-axial fluid collection. No midline shift. Vascular: Maintained flow voids within the proximal large arterial vessels. Skull and upper cervical spine: Right  frontal and parietal burr holes. No focal suspicious marrow lesion. Sinuses/Orbits: No mass or acute finding within the imaged orbits. Minimal mucosal thickening within the bilateral ethmoid air cells. Other: Trace fluid within the bilateral mastoid air cells. IMPRESSION: Multiple acute cortically-based infarcts within the right MCA territory and right MCA/PCA watershed territory. The largest acute infarct is located within the posterior right frontal lobe, measuring 4.7 cm and involving the right precentral gyrus. Background parenchymal atrophy, chronic small vessel ischemic disease and chronic infarcts as detailed. Chronic hemorrhage within the right cerebellar hemisphere, unchanged. Chronic blood  products also present at site of the chronic left PCA territory infarct. Electronically Signed   By: Kellie Simmering D.O.   On: 10/22/2022 10:29   DG Chest Portable 1 View  Result Date: 10/22/2022 CLINICAL DATA:  Altered mental status 03/26/2018 EXAM: PORTABLE CHEST 1 VIEW COMPARISON:  None Available. FINDINGS: Cardiomegaly, vascular congestion. Interstitial and airspace opacities bilaterally, left greater than right. No visible effusions or acute bony abnormality. IMPRESSION: Cardiomegaly. Bilateral interstitial and airspace opacities, left greater than right could reflect asymmetric edema or infection. Electronically Signed   By: Rolm Baptise M.D.   On: 10/22/2022 00:51   CT ANGIO HEAD NECK W WO CM (CODE STROKE)  Result Date: 10/22/2022 CLINICAL DATA:  Code stroke EXAM: CT ANGIOGRAPHY HEAD AND NECK TECHNIQUE: Multidetector CT imaging of the head and neck was performed using the standard protocol during bolus administration of intravenous contrast. Multiplanar CT image reconstructions and MIPs were obtained to evaluate the vascular anatomy. Carotid stenosis measurements (when applicable) are obtained utilizing NASCET criteria, using the distal internal carotid diameter as the denominator. RADIATION DOSE REDUCTION: This exam was performed according to the departmental dose-optimization program which includes automated exposure control, adjustment of the mA and/or kV according to patient size and/or use of iterative reconstruction technique. CONTRAST:  68m OMNIPAQUE IOHEXOL 350 MG/ML SOLN COMPARISON:  None Available. FINDINGS: CTA NECK FINDINGS SKELETON: There is no bony spinal canal stenosis. No lytic or blastic lesion. OTHER NECK: Normal pharynx, larynx and major salivary glands. No cervical lymphadenopathy. Unremarkable thyroid gland. UPPER CHEST: No pneumothorax or pleural effusion. No nodules or masses. AORTIC ARCH: There is calcific atherosclerosis of the aortic arch. There is no aneurysm, dissection or  hemodynamically significant stenosis of the visualized portion of the aorta. Conventional 3 vessel aortic branching pattern. The visualized proximal subclavian arteries are widely patent. RIGHT CAROTID SYSTEM: Normal without aneurysm, dissection or stenosis. LEFT CAROTID SYSTEM: Normal without aneurysm, dissection or stenosis. VERTEBRAL ARTERIES: Left dominant configuration. Both origins are clearly patent. There is no dissection, occlusion or flow-limiting stenosis to the skull base (V1-V3 segments). CTA HEAD FINDINGS POSTERIOR CIRCULATION: --Vertebral arteries: Normal V4 segments. --Inferior cerebellar arteries: Normal. --Basilar artery: Normal. --Superior cerebellar arteries: Normal. --Posterior cerebral arteries (PCA): Normal. ANTERIOR CIRCULATION: --Intracranial internal carotid arteries: Normal. --Anterior cerebral arteries (ACA): Normal. Both A1 segments are present. Patent anterior communicating artery (a-comm). --Middle cerebral arteries (MCA): Normal. VENOUS SINUSES: As permitted by contrast timing, patent. ANATOMIC VARIANTS: None Review of the MIP images confirms the above findings. IMPRESSION: No emergent large vessel occlusion or high-grade stenosis of the intracranial arteries. Aortic atherosclerosis (ICD10-I70.0). Electronically Signed   By: KUlyses JarredM.D.   On: 10/22/2022 00:24   CT HEAD CODE STROKE WO CONTRAST  Result Date: 10/22/2022 CLINICAL DATA:  Code stroke.  Left-sided weakness and facial droop EXAM: CT HEAD WITHOUT CONTRAST TECHNIQUE: Contiguous axial images were obtained from the base of the skull through the vertex without  intravenous contrast. RADIATION DOSE REDUCTION: This exam was performed according to the departmental dose-optimization program which includes automated exposure control, adjustment of the mA and/or kV according to patient size and/or use of iterative reconstruction technique. COMPARISON:  None Available. FINDINGS: Brain: There is no mass, hemorrhage or  extra-axial collection. Generalized atrophy. 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 Vascular: No abnormal hyperdensity of the major intracranial arteries or dural venous sinuses. No intracranial atherosclerosis. Skull: The visualized skull base, calvarium and extracranial soft tissues are normal. Sinuses/Orbits: No fluid levels or advanced mucosal thickening of the visualized paranasal sinuses. No mastoid or middle ear effusion. The orbits are normal. ASPECTS [facility] Stroke Program Early CT Score) - Ganglionic level infarction (caudate, lentiform nuclei, internal capsule, insula, M1-M3 cortex): 7 - Supraganglionic infarction (M4-M6 cortex): 3 Total score (0-10 with 10 being normal): 10 IMPRESSION: 1. No acute hemorrhage. 2. 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 3. ASPECTS is 10. These results were communicated to Dr. Amie Portland at 12:13 am on 10/22/2022 by text page via the [facility] messaging system. Electronically Signed   By: Ulyses Jarred M.D.   On: 10/22/2022 00:14     Subjective: Patient seen examined at bedside, resting comfortably.  Lying in bed.  Remains confused.  Son present.  No significant change.  Has met with palliative care and son has decided to transition to home hospice.  Equipment was delivered last night.  Ready for discharge home today.  No other specific questions or concerns at this time.  Unable to obtain any further ROS from the patient due to his mental status.  No acute concerns overnight per nursing staff.  Discharge Exam: Vitals:   10/31/22 0754 10/31/22 0758  BP: 106/78 129/71  Pulse: 77 79  Resp: 16 18  Temp: (!) 96.4 F (35.8 C) 98.6 F (37 C)  SpO2: 94% (!) 88%   Vitals:   10/30/22 2017 10/30/22 2254 10/31/22 0754 10/31/22 0758  BP: (!) 139/92 121/74 106/78 129/71  Pulse: 99 64 77 79  Resp:  (!) _0 Temp: (!) 97.4 F (36.3 C)  (!) 96.4 F (35.8 C) 98.6 F  (37 C)  TempSrc: Oral  Axillary   SpO2: 93% 90% 94% (!) 88%  Weight:      Height:        Physical Exam: GEN: NAD, alert, chronically ill in appearance HEENT: NCAT, PERRL, EOMI, sclera clear, MMM PULM: CTAB w/o wheezes/crackles, normal respiratory effort, on room air CV: RRR w/o M/G/R GI: abd soft, NTND, NABS, no R/G/M MSK: no peripheral edema, moves all extremities independently    The results of significant diagnostics from this hospitalization (including imaging, microbiology, ancillary and laboratory) are listed below for reference.     Microbiology: Recent Results (from the past 240 hour(s))  Urine Culture     Status: None   Collection Time: 10/22/22  2:10 AM   Specimen: Urine, Clean Catch  Result Value Ref Range Status   Specimen Description URINE, CLEAN CATCH  Final   Special Requests NONE  Final   Culture   Final    NO GROWTH Performed at [facility], [address], [place], [place] [address]    Report Status 10/23/2022 FINAL  Final  Resp panel by RT-PCR (RSV, Flu A&B, Covid) Anterior Nasal Swab     Status: None   Collection Time: 10/22/22  2:19 AM   Specimen:  Anterior Nasal Swab  Result Value Ref Range Status   SARS Coronavirus 2 by RT PCR NEGATIVE NEGATIVE Final    Comment: (NOTE) SARS-CoV-2 target nucleic acids are NOT DETECTED.  The SARS-CoV-2 RNA is generally detectable in upper respiratory specimens during the acute phase of infection. The lowest concentration of SARS-CoV-2 viral copies this assay can detect is 138 copies/mL. A negative result does not preclude SARS-Cov-2 infection and should not be used as the sole basis for treatment or other patient management decisions. A negative result may occur with  improper specimen collection/handling, submission of specimen other than nasopharyngeal swab, presence of viral mutation(s) within the areas targeted by this assay, and inadequate number of viral copies(<138 copies/mL). A negative result must  be combined with clinical observations, patient history, and epidemiological information. The expected result is Negative.  Fact Sheet for Patients:  EntrepreneurPulse.com.au  Fact Sheet for Healthcare Providers:  IncredibleEmployment.be  This test is no t yet approved or cleared by the [place] FDA and  has been authorized for detection and/or diagnosis of SARS-CoV-2 by FDA under an Emergency Use Authorization (EUA). This EUA will remain  in effect (meaning this test can be used) for the duration of the COVID-19 declaration under Section 564(b)(1) of the Act, 21 U.S.C.section 360bbb-3(b)(1), unless the authorization is terminated  or revoked sooner.       Influenza A by PCR NEGATIVE NEGATIVE Final   Influenza B by PCR NEGATIVE NEGATIVE Final    Comment: (NOTE) The Xpert Xpress SARS-CoV-2/FLU/RSV plus assay is intended as an aid in the diagnosis of influenza from Nasopharyngeal swab specimens and should not be used as a sole basis for treatment. Nasal washings and aspirates are unacceptable for Xpert Xpress SARS-CoV-2/FLU/RSV testing.  Fact Sheet for Patients: EntrepreneurPulse.com.au  Fact Sheet for Healthcare Providers: IncredibleEmployment.be  This test is not yet approved or cleared by the [place] FDA and has been authorized for detection and/or diagnosis of SARS-CoV-2 by FDA under an Emergency Use Authorization (EUA). This EUA will remain in effect (meaning this test can be used) for the duration of the COVID-19 declaration under Section 564(b)(1) of the Act, 21 U.S.C. section 360bbb-3(b)(1), unless the authorization is terminated or revoked.     Resp Syncytial Virus by PCR NEGATIVE NEGATIVE Final    Comment: (NOTE) Fact Sheet for Patients: EntrepreneurPulse.com.au  Fact Sheet for Healthcare Providers: IncredibleEmployment.be  This test is not  yet approved or cleared by the [place] FDA and has been authorized for detection and/or diagnosis of SARS-CoV-2 by FDA under an Emergency Use Authorization (EUA). This EUA will remain in effect (meaning this test can be used) for the duration of the COVID-19 declaration under Section 564(b)(1) of the Act, 21 U.S.C. section 360bbb-3(b)(1), unless the authorization is terminated or revoked.  Performed at [facility], [place] [address], [place], [place] [address]   Blood Culture (routine x 2)     Status: None   Collection Time: 10/22/22  3:00 AM   Specimen: BLOOD  Result Value Ref Range Status   Specimen Description BLOOD SITE NOT SPECIFIED  Final   Special Requests   Final    BOTTLES DRAWN AEROBIC AND ANAEROBIC Blood Culture results may not be optimal due to an inadequate volume of blood received in culture bottles   Culture   Final    NO GROWTH 5 DAYS Performed at [facility], [place] [address], [place], [place] [address]    Report Status 10/27/2022 FINAL  Final  Blood Culture (routine x 2)     Status: None   Collection Time: 10/22/22  6:33 AM   Specimen: BLOOD  Result Value Ref Range Status   Specimen Description BLOOD SITE NOT SPECIFIED  Final   Special Requests   Final    BOTTLES DRAWN AEROBIC AND ANAEROBIC Blood Culture adequate volume   Culture   Final    NO GROWTH 5 DAYS Performed at [facility], [address] [place], [place] [address]    Report Status 10/27/2022 FINAL  Final     Labs: BNP (last 3 results) Recent Labs    10/22/22 0218  BNP 845.3*   Basic Metabolic Panel: Recent Labs  Lab 10/25/22 0423 10/26/22 0320 10/26/22 2345 10/27/22 0357 10/28/22 0404 10/29/22 0405  NA 127* 130* 129* 132* 131* 132*  K 3.6 3.7 4.1 4.1 4.5 4.3  CL 98 99 98 100 100 99  CO2 20* 22 21* _0 GLUCOSE 145* 144* 143* 133* 130* 139*  BUN _1 CREATININE 0.72 0.72 0.71 0.71 0.68 0.68  CALCIUM 7.9* 8.4* 8.4* 8.6* 8.7* 8.9  MG 2.0  1.8 1.8 1.8 1.9 1.8  PHOS 2.4* 2.4*  --  3.3 3.5 4.1   Liver Function Tests: No results for input(s): "AST", "ALT", "ALKPHOS", "BILITOT", "PROT", "ALBUMIN" in the last 168 hours. No results for input(s): "LIPASE", "AMYLASE" in the last 168 hours. No results for input(s): "AMMONIA" in the last 168 hours. CBC: No results for input(s): "WBC", "NEUTROABS", "HGB", "HCT", "MCV", "PLT" in the last 168 hours. Cardiac Enzymes: No results for input(s): "CKTOTAL", "CKMB", "CKMBINDEX", "TROPONINI" in the last 168 hours. BNP: Invalid input(s): "POCBNP" CBG: Recent Labs  Lab 10/30/22 1642 10/30/22 2019 10/31/22 0044 10/31/22 0632 10/31/22 0741  GLUCAP 145* 125* 150* 125* 175*   D-Dimer No results for input(s): "DDIMER" in the last 72 hours. Hgb A1c No results for input(s): "HGBA1C" in the last 72 hours. Lipid Profile No results for input(s): "CHOL", "HDL", "LDLCALC", "TRIG", "CHOLHDL", "LDLDIRECT" in the last 72 hours. Thyroid function studies No results for input(s): "TSH", "T4TOTAL", "T3FREE", "THYROIDAB" in the last 72 hours.  Invalid input(s): "FREET3" Anemia work up No results for input(s): "VITAMINB12", "FOLATE", "FERRITIN", "TIBC", "IRON", "RETICCTPCT" in the last 72 hours. Urinalysis    Component Value Date/Time   COLORURINE STRAW (A) 10/22/2022 0210   APPEARANCEUR CLEAR 10/22/2022 0210   LABSPEC 1.013 10/22/2022 0210   PHURINE 8.0 10/22/2022 0210   GLUCOSEU NEGATIVE 10/22/2022 0210   HGBUR SMALL (A) 10/22/2022 0210   BILIRUBINUR NEGATIVE 10/22/2022 0210   KETONESUR 5 (A) 10/22/2022 0210   PROTEINUR NEGATIVE 10/22/2022 0210   NITRITE NEGATIVE 10/22/2022 0210   LEUKOCYTESUR NEGATIVE 10/22/2022 0210   Sepsis Labs No results for input(s): "WBC" in the last 168 hours.  Invalid input(s): "PROCALCITONIN", "LACTICIDVEN" Microbiology Recent Results (from the past 240 hour(s))  Urine Culture     Status: None   Collection Time: 10/22/22  2:10 AM   Specimen: Urine, Clean  Catch  Result Value Ref Range Status   Specimen Description URINE, CLEAN CATCH  Final   Special Requests NONE  Final   Culture   Final    NO GROWTH Performed at [facility], [address], [place], [place] [address]    Report Status 10/23/2022 FINAL  Final  Resp panel by RT-PCR (RSV, Flu A&B, Covid) Anterior Nasal Swab     Status: None   Collection Time: 10/22/22  2:19 AM   Specimen: Anterior Nasal Swab  Result Value Ref Range Status   SARS Coronavirus 2 by RT PCR NEGATIVE NEGATIVE Final    Comment: (NOTE) SARS-CoV-2 target nucleic acids are NOT DETECTED.  The SARS-CoV-2 RNA is generally detectable in upper respiratory specimens during the acute phase of infection. The lowest concentration of SARS-CoV-2 viral copies this assay can detect is 138 copies/mL. A negative result does not preclude SARS-Cov-2 infection and should not be used as the sole basis for treatment or other patient management decisions. A negative result may occur with  improper specimen collection/handling, submission of specimen other than nasopharyngeal swab, presence of viral mutation(s) within the areas targeted by this assay, and inadequate number of viral copies(<138 copies/mL). A negative result must be combined with clinical observations, patient history, and epidemiological information. The expected result is Negative.  Fact Sheet for Patients:  EntrepreneurPulse.com.au  Fact Sheet for Healthcare Providers:  IncredibleEmployment.be  This test is no t yet approved or cleared by the [place] FDA and  has been authorized for detection and/or diagnosis of SARS-CoV-2 by FDA under an Emergency Use Authorization (EUA). This EUA will remain  in effect (meaning this test can be used) for the duration of the COVID-19 declaration under Section 564(b)(1) of the Act, 21 U.S.C.section 360bbb-3(b)(1), unless the authorization is terminated  or revoked sooner.        Influenza A by PCR NEGATIVE NEGATIVE Final   Influenza B by PCR NEGATIVE NEGATIVE Final    Comment: (NOTE) The Xpert Xpress SARS-CoV-2/FLU/RSV plus assay is intended as an aid in the diagnosis of influenza from Nasopharyngeal swab specimens and should not be used as a sole basis for treatment. Nasal washings and aspirates are unacceptable for Xpert Xpress SARS-CoV-2/FLU/RSV testing.  Fact Sheet for Patients: EntrepreneurPulse.com.au  Fact Sheet for Healthcare Providers: IncredibleEmployment.be  This test is not yet approved or cleared by the [place] FDA and has been authorized for detection and/or diagnosis of SARS-CoV-2 by FDA under an Emergency Use Authorization (EUA). This EUA will remain in effect (meaning this test can be used) for the duration of the COVID-19 declaration under Section 564(b)(1) of the Act, 21 U.S.C. section 360bbb-3(b)(1), unless the authorization is terminated or revoked.     Resp Syncytial Virus by PCR NEGATIVE NEGATIVE Final    Comment: (NOTE) Fact Sheet for Patients: EntrepreneurPulse.com.au  Fact Sheet for Healthcare Providers: IncredibleEmployment.be  This test is not yet approved or cleared by the [place] FDA and has been authorized for detection and/or diagnosis of SARS-CoV-2 by FDA under an Emergency Use Authorization (EUA). This EUA will remain in effect (meaning this test can be used) for the duration of the COVID-19 declaration under Section 564(b)(1) of the Act, 21 U.S.C. section 360bbb-3(b)(1), unless the authorization is terminated or revoked.  Performed at [facility], [place] [address], [place], [place] [address]   Blood Culture (routine x 2)     Status: None   Collection Time: 10/22/22  3:00 AM   Specimen: BLOOD  Result Value Ref Range Status   Specimen Description BLOOD SITE NOT SPECIFIED  Final   Special Requests   Final    BOTTLES DRAWN  AEROBIC AND ANAEROBIC Blood Culture results may not be optimal due to an inadequate volume of blood received in culture bottles   Culture   Final    NO GROWTH 5 DAYS Performed at [facility], [place] [address], [place], [place] [address]    Report  Status 10/27/2022 FINAL  Final  Blood Culture (routine x 2)     Status: None   Collection Time: 10/22/22  6:33 AM   Specimen: BLOOD  Result Value Ref Range Status   Specimen Description BLOOD SITE NOT SPECIFIED  Final   Special Requests   Final    BOTTLES DRAWN AEROBIC AND ANAEROBIC Blood Culture adequate volume   Culture   Final    NO GROWTH 5 DAYS Performed at [facility] [facility], [address] [place], [place] [address]    Report Status 10/27/2022 FINAL  Final     Time coordinating discharge: Over 30 minutes  SIGNED:   Yuriy Cui J [place] DO  [facility] 10/31/2022, 11:28 AM

## 2022-10-31 NOTE — Consult Note (Signed)
Consultation Note Date: 10/31/2022   Patient Name: Ryan Savage  DOB: 05/22/1929  MRN: [id number]  Age / Sex: 88 y.o., male  PCP: Lajean Manes, MD Referring Physician: [place] Eric J, DO  Reason for Consultation: Establishing goals of care and Psychosocial/spiritual support  HPI/Patient Profile: 88 y.o. 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 ED on 12/12 via EMS from home with confusion, slurred speech, left-sided weakness, and facial droop.    Last known normal was 2130 the night prior.    Per wife, he was unable to communicate which is not his typical baseline.  Code stroke was initiated on ED arrival with neurology consultation.    Patient underwent CT head with old left PCA infarct and multiple old small vessel infarcts in the cerebellum, CTA head/neck with no emergent LVO or high-grade stenosis.  MRI with multiple acute cortically based infarct within the right MCA territory and right MCA/PCA watershed territory.    TRH was consulted for admission for further evaluation management of acute CVA.   Family has made decision to shift focus of care to full comfort and plan is for discharge home with hospice.  Clinical Assessment and Goals of Care:  This NP Wadie Lessen reviewed medical records, received report from team, assessed the patient and then meet at the patient's bedside along with his son/ to discuss next steps in treatment plan.    Concept of Palliative Care was introduced as specialized medical care for people and their families living with serious illness.  If focuses on providing relief  from the symptoms and stress of a serious illness.  The goal is to improve quality of life for both the patient and the family.   Values and goals of care important to patient and family were attempted to be elicited.  Son verbalizes a clear understanding of his father's current medical situation and the associated limited prognosis..  Family's main hope is for comfort, quality and dignity at end-of-life.  Plan is to discharge home with hospice today.  Education offered on hospice benefit; philosophy and eligibility   Natural trajectory and expectations at EOL were discussed.  Questions and concerns addressed.    Family   encouraged to call with questions or concerns.     PMT will continue to support holistically.     SUMMARY OF RECOMMENDATIONS    Code Status/Advance Care Planning: DNR   Symptom Management:  Roxanol for pain and dyspnea  Palliative Prophylaxis:  Aspiration, Bowel Regimen, Frequent Pain Assessment, and Oral Care  Additional Recommendations (Limitations, Scope, Preferences): Full Comfort Care  Psycho-social/Spiritual:  Desire for further Chaplaincy support:no Additional Recommendations: Education on Hospice  Prognosis:  < 2 weeks  Discharge Planning: Home with Hospice      Primary Diagnoses: Present on Admission:  Cerebral embolism with cerebral infarction  Atrial fibrillation, chronic (HCC)   I have reviewed the medical record, interviewed the patient and family, and examined the patient. The following aspects are pertinent.  Past Medical History:  Diagnosis Date   Aortic stenosis 05/22/2018   Mild with mean AVG 36mHg by echo 05/2018   Atrial fibrillation, chronic (HCC)    Not on anticoagulation due to history of bilateral subdural bleeds due to recurrent falls   BPH (benign prostatic hypertrophy)    [name] (coronary artery disease), native coronary artery    Severe two-vessel [name] with chronically occluded LAD and RCA with widely patent left main,  intermediate, and left circumflex branches.  On medical management.  He has extensive collaterals.   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 fib with RVR.  Repeat echo 05/2018 with EF 45-50%.   [name] (degenerative joint disease)    GERD (gastroesophageal reflux disease)    Hyperlipidemia    LDL goal less then 100   Hypertension    Microscopic hematuria    Dr DDiona Fanti  Mitral valve prolapse    with mild MR   OSA (obstructive sleep apnea)    Pulmonary HTN (HCC)    PASP 528mg by echo 05/2018 but normal pressures on right heart cath.   SDH (subdural hematoma) (HCC) 01/2018   SIADH (syndrome of inappropriate ADH production) (HCC)    Skin cancer    "side of my nose" (11/19/2017)   Stroke ([facility]   "fully recovered"   [place]rombocyt[place] ([place]   Mild- platelet count 143,000 on 02/2011, stable 08/2011   Social History   Socioeconomic History   Marital status: Married    Spouse name: Not on file   Number of children: Not on file   Years of education: Not on file   Highest education level: Not on file  Occupational History   Occupation: retired  Tobacco Use   Smoking status: Former    Years: 3.00    Types: Cigarettes    Quit date: 1958    Years since quitting: 66.0   Smokeless tobacco: Never  Vaping Use   Vaping Use: Never used  Substance and Sexual Activity   Alcohol use: No   Drug use: No   Sexual activity: Not on file  Other Topics Concern   Not on file  Social History Narrative   Not on file   Social Determinants of Health   Financial Resource Strain: Low Risk  (03/25/2022)   Overall Financial Resource Strain (CARDIA)    Difficulty of Paying Living Expenses: Not hard at all  Food Insecurity: No Food Insecurity (10/22/2022)   Hunger Vital Sign    Worried About Running Out of Food in the Last Year: Never true    Ran Out of Food  in the Last Year: Never true  Transportation Needs: No Transportation Needs (10/22/2022)   PRAPARE - TrHydrologistMedical): No    Lack of Transportation (Non-Medical): No  Physical Activity: Not on file  Stress: No Stress Concern Present (03/25/2022)   FiCastor  Feeling of Stress : Only a little  Social Connections: Not on file   Family History  Problem Relation Age of Onset   Heart failure Mother    Heart attack Father  [name] Father    Heart attack Brother    [name] Brother    Colon cancer Brother    Colon cancer Sister    Scheduled Meds:  aspirin  325 mg Per Tube Daily   free water  50 mL Per Tube Q6H   heparin injection (subcutaneous)  5,000 Units Subcutaneous Q8H   insulin aspart  0-6 Units Subcutaneous Q4H   metoprolol tartrate  25 mg Per NG tube BID   polyethylene glycol  17 g Per Tube BID   QUEtiapine  100 mg Oral QHS   simvastatin  10 mg Per NG tube QPM   Continuous Infusions:  feeding supplement (OSMOLITE 1.2 CAL) 1,000 mL (10/29/22 2036)   PRN Meds:.acetaminophen, albuterol Medications Prior to Admission:  Prior to Admission medications   Medication Sig Start Date End Date Taking? Authorizing Provider  acetaminophen (TYLENOL) 325 MG tablet Take 1-2 tablets (325-650 mg total) by mouth every 4 (four) hours as needed for mild pain. 04/03/18  Yes Love, Ivan Anchors, PA-C  amLODipine (NORVASC) 2.5 MG tablet Take 1 tablet (2.5 mg total) by mouth every evening. 03/28/21  Yes Sueanne Margarita, MD  aspirin EC 81 MG tablet Take 81 mg by mouth daily. Swallow whole.   Yes [provider]  finasteride (PROSCAR) 5 MG tablet Take 1 tablet (5 mg total) by mouth daily. 03/05/18  Yes Love, Ivan Anchors, PA-C  Melatonin 3 MG TABS Take 3 mg by mouth at bedtime.   Yes [provider]  metoprolol succinate (TOPROL-XL) 25 MG 24 hr tablet Take 1 tablet (25 mg total) by mouth daily.  03/28/21  Yes Turner, Eber Hong, MD  Multiple Vitamin (MULTIVITAMIN WITH MINERALS) TABS tablet Take 1 tablet by mouth daily. 04/04/18  Yes Love, Ivan Anchors, PA-C  Multiple Vitamins-Minerals (PRESERVISION AREDS 2 PO) Take 1 capsule by mouth in the morning and at bedtime.   Yes [provider]  simvastatin (ZOCOR) 10 MG tablet Take 1 tablet (10 mg total) by mouth every evening. 03/28/21  Yes Turner, Eber Hong, MD  Cholecalciferol (VITAMIN D3) 50 MCG (2000 UT) TABS Take 2,000 Units by mouth every other day. Patient not taking: Reported on 10/22/2022    [provider]   Allergies  Allergen Reactions   Methylphenidate Derivatives Other (See Comments)    Restless leg   Warfarin Sodium     Other reaction(s): subdural   Novocain [Procaine] Rash   Review of Systems  Unable to perform ROS: Acuity of condition    Physical Exam  Vital Signs: BP 129/71 (BP Location: Left Arm)   Pulse 79   Temp 98.6 F (37 C)   Resp 18   Ht '5\' 9"'$  (1.753 m)   Wt 81.3 kg   SpO2 (!) 88%   BMI 26.47 kg/m  Pain Scale: 0-10   Pain Score: Asleep   SpO2: SpO2: (!) 88 % O2 Device:SpO2: (!) 88 % O2 Flow Rate: .O2 Flow Rate (L/min): 1 L/min  IO: Intake/output summary:  Intake/Output Summary (Last 24 hours) at 10/31/2022 1005 Last data filed at 10/31/2022 0000 Gross per 24 hour  Intake 60 ml  Output 1500 ml  Net -1440 ml    LBM: Last BM Date : 10/30/22 Baseline Weight: Weight: 83.8 kg Most recent weight: Weight: 81.3 kg     Palliative Assessment/Data:   Discussed with treatment team   Time In: 0930 Time Out: 1045 Time Total: 75 minutes Greater than 50%  of this time was spent counseling and coordinating  care related to the above assessment and plan.  Signed by: Wadie Lessen, NP   Please contact Palliative Medicine Team phone at [phone] for questions and concerns.  For individual provider: See Shea Evans

## 2022-10-31 NOTE — Care Management (Signed)
PTAR called  

## 2022-10-31 NOTE — Progress Notes (Signed)
Mobility Specialist: Progress Note   10/31/22 1130  Mobility  Activity Refused mobility   Family politely declining mobility as pt is supposed to discharge soon.   [place] Tobin Mobility Specialist Please contact via SecureChat or Rehab office at [phone]

## 2022-10-31 NOTE — Progress Notes (Signed)
SLP Cancellation Note  Patient Details Name: Ryan Savage MRN: [id number] DOB: May 02, 1929   Cancelled treatment:       Reason Eval/Treat Not Completed: Patient at procedure or test/unavailable;pt is currently being d/c home with Hospice care.  ST will s/o at this time.   Elvina Sidle, M.S., CCC-SLP 10/31/2022, 2:38 PM

## 2022-10-31 NOTE — TOC Progression Note (Signed)
Transition of Care [facility]) - Progression Note    Patient Details  Name: Ryan Savage MRN: [id number] Date of Birth: Feb 22, 1929  [facility]) CM/SW Contact  Jinger Neighbors, [place] Phone Number: 10/31/2022, 8:52 AM  Clinical Narrative:     CSW met with son at bedside. He inquired about time for tranpsort and CSW informed him CSW would follow up with [place], [facility].   Planned Disposition: Home Hospice Barriers to Discharge: Continued Medical Work up  Expected Discharge Plan and Services In-house Referral: Clinical Social Work Discharge Planning Services: CM Consult Post Acute Care Choice: Hospice Living arrangements for the past 2 months: Single Family Home                                       Social Determinants of Health (SDOH) Interventions SDOH Screenings   Food Insecurity: No Food Insecurity (10/22/2022)  Housing: Low Risk  (10/22/2022)  Transportation Needs: No Transportation Needs (10/22/2022)  Utilities: Not At Risk (10/22/2022)  Alcohol Screen: Low Risk  (03/25/2022)  Depression (PHQ2-9): Low Risk  (03/25/2022)  Financial Resource Strain: Low Risk  (03/25/2022)  Stress: No Stress Concern Present (03/25/2022)  Tobacco Use: Medium Risk (10/22/2022)    Readmission Risk Interventions     No data to display

## 2022-11-11 DEATH — deceased

## 2022-12-20 ENCOUNTER — Other Ambulatory Visit (HOSPITAL_COMMUNITY): Payer: Self-pay
